# Patient Record
Sex: Male | Born: 1965
Health system: Southern US, Community
[De-identification: ages and names within clinical notes are randomized; demographics above are authoritative.]

## PROBLEM LIST (undated history)

## (undated) DIAGNOSIS — E119 Type 2 diabetes mellitus without complications: Secondary | ICD-10-CM

## (undated) DIAGNOSIS — I10 Essential (primary) hypertension: Secondary | ICD-10-CM

## (undated) DIAGNOSIS — E785 Hyperlipidemia, unspecified: Secondary | ICD-10-CM

## (undated) HISTORY — DX: Hyperlipidemia, unspecified: E78.5

## (undated) HISTORY — PX: NO PAST SURGERIES: SHX2092

## (undated) HISTORY — DX: Essential (primary) hypertension: I10

## (undated) HISTORY — DX: Type 2 diabetes mellitus without complications: E11.9

---

## 2007-06-26 ENCOUNTER — Emergency Department: Payer: Self-pay | Admitting: Emergency Medicine

## 2007-06-27 ENCOUNTER — Emergency Department: Payer: Self-pay | Admitting: Emergency Medicine

## 2013-04-22 ENCOUNTER — Ambulatory Visit: Payer: Self-pay | Admitting: Family Medicine

## 2013-04-22 ENCOUNTER — Emergency Department: Payer: Self-pay | Admitting: Emergency Medicine

## 2014-05-14 ENCOUNTER — Ambulatory Visit: Payer: Self-pay | Admitting: Family Medicine

## 2014-06-02 ENCOUNTER — Ambulatory Visit: Payer: Self-pay | Admitting: Surgery

## 2014-06-15 ENCOUNTER — Ambulatory Visit: Payer: Self-pay | Admitting: Gastroenterology

## 2014-07-06 ENCOUNTER — Ambulatory Visit: Payer: Self-pay | Admitting: Gastroenterology

## 2014-10-30 ENCOUNTER — Emergency Department: Payer: Self-pay | Admitting: Emergency Medicine

## 2014-11-13 IMAGING — NM NUCLEAR MEDICINE GASTRIC EMPTYING STUDY
1 series · 10 of 10 positions shown · non-contrast
Comparison: 06/02/2014 MRI and 05/14/2014 ultrasound

CLINICAL DATA: Diabetes, retained food in stomach after eating

EXAM:
NUCLEAR MEDICINE GASTRIC EMPTYING SCAN
TECHNIQUE: After oral ingestion of radiolabeled meal, sequential abdominal
images were obtained for 4 hours. Percentage of activity emptying
the stomach calculated at 1 hour, 2 hour, 3 hour, and 4 hours.
RADIOPHARMACEUTICALS:  2.14 Technetium 99-m labeled sulfur colloid

[Series 1000: gatric statics (results) · 3.90mm/px · 5 acquisitions, 10 frames shown]
[im 1/5]
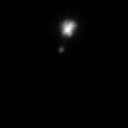
[im 1/5]
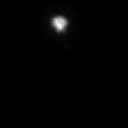
[im 2/5]
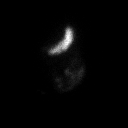
[im 2/5]
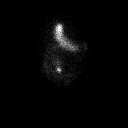
[im 3/5]
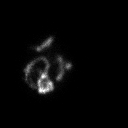
[im 3/5]
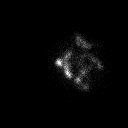
[im 4/5]
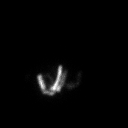
[im 4/5]
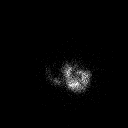
[im 5/5]
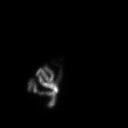
[im 5/5]
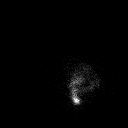

[10 of 10 positions shown; findings below may reference images not displayed]

FINDINGS: Expected location of the stomach in the left upper quadrant.
Ingested meal empties the stomach gradually over the course of the
study.

29% emptied at 1 hr ( normal >= 10%)

86% emptied at 2 hr ( normal >= 40%)

99% emptied at 3 hr ( normal >= 70%)

100% emptied at 4 hr ( normal >= 90%)
IMPRESSION: Normal gastric emptying study.

## 2015-06-04 ENCOUNTER — Other Ambulatory Visit: Payer: Self-pay

## 2015-06-04 NOTE — Telephone Encounter (Signed)
Pharmacy sent Rx request. Thanks!

## 2015-07-05 ENCOUNTER — Ambulatory Visit: Payer: Self-pay | Admitting: Family Medicine

## 2015-07-05 ENCOUNTER — Ambulatory Visit (INDEPENDENT_AMBULATORY_CARE_PROVIDER_SITE_OTHER): Payer: 59 | Admitting: Family Medicine

## 2015-07-05 ENCOUNTER — Encounter: Payer: Self-pay | Admitting: Family Medicine

## 2015-07-05 VITALS — BP 135/75 | HR 88 | Temp 97.1°F | Resp 17 | Ht 70.0 in | Wt 220.6 lb

## 2015-07-05 DIAGNOSIS — E1165 Type 2 diabetes mellitus with hyperglycemia: Secondary | ICD-10-CM | POA: Insufficient documentation

## 2015-07-05 DIAGNOSIS — I10 Essential (primary) hypertension: Secondary | ICD-10-CM | POA: Diagnosis not present

## 2015-07-05 DIAGNOSIS — E785 Hyperlipidemia, unspecified: Secondary | ICD-10-CM | POA: Insufficient documentation

## 2015-07-05 DIAGNOSIS — Z789 Other specified health status: Secondary | ICD-10-CM

## 2015-07-05 DIAGNOSIS — IMO0002 Reserved for concepts with insufficient information to code with codable children: Secondary | ICD-10-CM

## 2015-07-05 MED ORDER — SAXAGLIPTIN HCL 5 MG PO TABS: 5.0000 mg | ORAL_TABLET | Freq: Every day | ORAL | Status: DC

## 2015-07-05 NOTE — Progress Notes (Signed)
Name: [name]   MRN: [id number]    DOB: [birth year]   Date:07/05/2015       Progress Note  Subjective  Chief Complaint  Chief Complaint  Patient presents with  . Advice Only    Complete Insurance Paperwork  . Diabetes    Diabetes He presents for his follow-up diabetic visit. He has type 2 diabetes mellitus. His disease course has been stable. There are no hypoglycemic associated symptoms. Pertinent negatives for hypoglycemia include no headaches. Pertinent negatives for diabetes include no chest pain, no foot paresthesias, no polydipsia and no polyuria. Pertinent negatives for diabetic complications include no CVA. Current diabetic treatment includes oral agent (dual therapy). His weight is stable. He rarely participates in exercise. An ACE inhibitor/angiotensin II receptor blocker is being taken. Eye exam is not current.  Hyperlipidemia This is a chronic problem. Recent lipid tests were reviewed and are high. Exacerbating diseases include diabetes. Pertinent negatives include no chest pain or shortness of breath. Current antihyperlipidemic treatment includes statins. Risk factors for coronary artery disease include male sex, obesity and dyslipidemia.  Hypertension This is a chronic problem. The problem is controlled. Pertinent negatives include no chest pain, headaches, palpitations or shortness of breath. Past treatments include ACE inhibitors and diuretics. There is no history of angina, kidney disease, CAD/MI or CVA.      Past Medical History  Diagnosis Date  . Diabetes mellitus without complication   . Hyperlipidemia   . Hypertension     History reviewed. No pertinent past surgical history.  Family History  Problem Relation Age of Onset  . Heart disease Father   . Diabetes Father     History   Social History  . Marital Status: Married    Spouse Name: N/A  . Number of Children: N/A  . Years of Education: N/A   Occupational History  . Not on file.   Social  History Main Topics  . Smoking status: Never Smoker   . Smokeless tobacco: Never Used  . Alcohol Use: 0.0 oz/week    0 Standard drinks or equivalent per week     Comment: Occasional  . Drug Use: No  . Sexual Activity: Yes   Other Topics Concern  . Not on file   Social History Narrative  . No narrative on file     Current outpatient prescriptions:  .  amLODipine (NORVASC) 10 MG tablet, , Disp: , Rfl:  .  aspirin 81 MG tablet, Take by mouth., Disp: , Rfl:  .  glucose blood (BAYER CONTOUR NEXT TEST) test strip, BAYER CONTOUR NEXT TEST (In Vitro Strip)  1 (one) Strip Strip three times daily for 90 days  Quantity: 1;  Refills: 3   Ordered :03-Feb-2013  [name] ;  Started 03-Feb-2013 Active, Disp: , Rfl:  .  lisinopril-hydrochlorothiazide (PRINZIDE,ZESTORETIC) 20-25 MG per tablet, , Disp: , Rfl:  .  metFORMIN (GLUCOPHAGE) 500 MG tablet, Take by mouth., Disp: , Rfl:  .  saxagliptin HCl (ONGLYZA) 5 MG TABS tablet, Take by mouth., Disp: , Rfl:  .  simvastatin (ZOCOR) 20 MG tablet, Take by mouth., Disp: , Rfl:   No Known Allergies   Review of Systems  Respiratory: Negative for shortness of breath.   Cardiovascular: Negative for chest pain and palpitations.  Neurological: Negative for headaches.  Endo/Heme/Allergies: Negative for polydipsia.     Objective  Filed Vitals:   07/05/15 1558  BP: 135/75  Pulse: 88  Temp: 97.1 F (36.2 C)  TempSrc: Oral  Resp:  17  Height: 5\' 10"  (1.778 m)  Weight: 220 lb 9.6 oz (100.064 kg)  SpO2: 94%    Physical Exam  Constitutional: He is well-developed, well-nourished, and in no distress.  HENT:  Head: Normocephalic and atraumatic.  Nursing note and vitals reviewed.      Assessment & Plan 1. Essential hypertension Blood pressure is stable and controlled on present therapy. Continue present management. - Comprehensive metabolic panel  2. Diabetes mellitus type 2, uncontrolled We will recheck A1c today and follow-up with  medication management. - HgB A1c - saxagliptin HCl (ONGLYZA) 5 MG TABS tablet; Take 1 tablet (5 mg total) by mouth daily.  Dispense: 90 tablet; Refill: 0  3. HLD (hyperlipidemia)  - Lipid panel  4. Non-smoker  - Nicotine/cotinine metabolites   Faline Langer Asad A. [facility] 07/05/2015 4:46 PM

## 2015-07-06 ENCOUNTER — Telehealth: Payer: Self-pay | Admitting: Family Medicine

## 2015-07-06 NOTE — Telephone Encounter (Signed)
Patient wants to know if it is ok for him to take Beta Prostate?

## 2015-07-06 NOTE — Telephone Encounter (Signed)
Patient is asking if he can take an herbal supplement called beta prostate, mainly to help with intimate relations. I have recommended that he should have his prostate examined by a digital rectal exam and a PSA determination if he has any prostate abnormality. I have generally cautioned to stay away from herbal supplements as they do not require the FDA approval to be sold in the market. Patient verbalized understanding.

## 2015-07-06 NOTE — Telephone Encounter (Signed)
Pt wants to know if its ok for him to take Beta Prostate? Please advise pt.

## 2015-07-07 LAB — COMPREHENSIVE METABOLIC PANEL
ALBUMIN: 4.2 g/dL (ref 3.5–5.5)
ALT: 16 IU/L (ref 0–44)
AST: 10 IU/L (ref 0–40)
Albumin/Globulin Ratio: 1.7 (ref 1.1–2.5)
Alkaline Phosphatase: 69 IU/L (ref 39–117)
BUN/Creatinine Ratio: 22 — ABNORMAL HIGH (ref 9–20)
BUN: 25 mg/dL — ABNORMAL HIGH (ref 6–24)
Bilirubin Total: 1.5 mg/dL — ABNORMAL HIGH (ref 0.0–1.2)
CHLORIDE: 99 mmol/L (ref 97–108)
CO2: 24 mmol/L (ref 18–29)
Calcium: 9.7 mg/dL (ref 8.7–10.2)
Creatinine, Ser: 1.16 mg/dL (ref 0.76–1.27)
GFR calc Af Amer: 86 mL/min/{1.73_m2} (ref >59)
GFR calc non Af Amer: 74 mL/min/{1.73_m2} (ref >59)
GLUCOSE: 278 mg/dL — AB (ref 65–99)
Globulin, Total: 2.5 g/dL (ref 1.5–4.5)
Potassium: 4.2 mmol/L (ref 3.5–5.2)
SODIUM: 140 mmol/L (ref 134–144)
Total Protein: 6.7 g/dL (ref 6.0–8.5)

## 2015-07-07 LAB — LIPID PANEL
CHOLESTEROL TOTAL: 188 mg/dL (ref 100–199)
Chol/HDL Ratio: 3.4 ratio units (ref 0.0–5.0)
HDL: 55 mg/dL (ref >39)
LDL Calculated: 94 mg/dL (ref 0–99)
TRIGLYCERIDES: 195 mg/dL — AB (ref 0–149)
VLDL Cholesterol Cal: 39 mg/dL (ref 5–40)

## 2015-07-07 LAB — HEMOGLOBIN A1C
ESTIMATED AVERAGE GLUCOSE: 252 mg/dL
Hgb A1c MFr Bld: 10.4 % — ABNORMAL HIGH (ref 4.8–5.6)

## 2015-07-07 NOTE — Telephone Encounter (Signed)
Dr. [name] has returned call to patient and gave him verbal advice and patient verbalized understanding.

## 2015-07-09 LAB — NICOTINE/COTININE METABOLITES
Cotinine: NOT DETECTED ng/mL
NICOTINE: NOT DETECTED ng/mL

## 2015-07-13 ENCOUNTER — Telehealth: Payer: Self-pay

## 2015-07-20 NOTE — Telephone Encounter (Signed)
Open in error

## 2015-09-02 ENCOUNTER — Other Ambulatory Visit: Payer: Self-pay | Admitting: Family Medicine

## 2015-11-22 ENCOUNTER — Telehealth: Payer: Self-pay

## 2015-11-22 MED ORDER — SAXAGLIPTIN HCL 5 MG PO TABS: 5.0000 mg | ORAL_TABLET | Freq: Every day | ORAL | Status: DC

## 2015-11-22 NOTE — Telephone Encounter (Signed)
Medication has been refilled and sent to [facility] °

## 2015-11-25 ENCOUNTER — Ambulatory Visit (INDEPENDENT_AMBULATORY_CARE_PROVIDER_SITE_OTHER): Payer: 59 | Admitting: Family Medicine

## 2015-11-25 ENCOUNTER — Encounter: Payer: Self-pay | Admitting: Family Medicine

## 2015-11-25 VITALS — BP 132/84 | HR 79 | Temp 97.6°F | Resp 16 | Ht 70.0 in | Wt 223.7 lb

## 2015-11-25 DIAGNOSIS — I10 Essential (primary) hypertension: Secondary | ICD-10-CM | POA: Diagnosis not present

## 2015-11-25 DIAGNOSIS — E785 Hyperlipidemia, unspecified: Secondary | ICD-10-CM | POA: Diagnosis not present

## 2015-11-25 DIAGNOSIS — E1165 Type 2 diabetes mellitus with hyperglycemia: Secondary | ICD-10-CM

## 2015-11-25 DIAGNOSIS — IMO0001 Reserved for inherently not codable concepts without codable children: Secondary | ICD-10-CM

## 2015-11-25 LAB — POCT GLYCOSYLATED HEMOGLOBIN (HGB A1C): HEMOGLOBIN A1C: 9.7

## 2015-11-25 LAB — GLUCOSE, POCT (MANUAL RESULT ENTRY): POC GLUCOSE: 249 mg/dL — AB (ref 70–99)

## 2015-11-25 MED ORDER — SIMVASTATIN 20 MG PO TABS: 20.0000 mg | ORAL_TABLET | Freq: Every day | ORAL | Status: DC

## 2015-11-25 MED ORDER — LISINOPRIL-HYDROCHLOROTHIAZIDE 20-25 MG PO TABS: 1.0000 | ORAL_TABLET | Freq: Every day | ORAL | Status: DC

## 2015-11-25 MED ORDER — SAXAGLIPTIN HCL 5 MG PO TABS: 5.0000 mg | ORAL_TABLET | Freq: Every day | ORAL | Status: DC

## 2015-11-25 MED ORDER — AMLODIPINE BESYLATE 10 MG PO TABS: 10.0000 mg | ORAL_TABLET | Freq: Every day | ORAL | Status: DC

## 2015-11-25 NOTE — Progress Notes (Signed)
Name: [name]   MRN: [id number]    DOB: 1966/10/25   Date:11/25/2015       Progress Note  Subjective  Chief Complaint  Chief Complaint  Patient presents with  . Medication Refill    simvastatin 20 mg / lisiniopril 20-25mg    . Diabetes  . Hyperlipidemia  . Hypertension    Diabetes He presents for his follow-up diabetic visit. He has type 2 diabetes mellitus. Pertinent negatives for hypoglycemia include no headaches. Associated symptoms include polyuria. Pertinent negatives for diabetes include no chest pain, no fatigue, no foot paresthesias and no polydipsia. His weight is stable. Frequency home blood tests: does not check his Blood Glucose.  Hyperlipidemia This is a chronic problem. The problem is uncontrolled. Recent lipid tests were reviewed and are high. Pertinent negatives include no chest pain, leg pain or shortness of breath. Current antihyperlipidemic treatment includes statins.  Hypertension This is a chronic problem. The problem is unchanged. The problem is controlled. Pertinent negatives include no chest pain, headaches, palpitations or shortness of breath. Risk factors for coronary artery disease include dyslipidemia, male gender and obesity. Past treatments include calcium channel blockers, diuretics and ACE inhibitors.    Past Medical History  Diagnosis Date  . Diabetes mellitus without complication ([place])   . Hyperlipidemia   . Hypertension     History reviewed. No pertinent past surgical history.  Family History  Problem Relation Age of Onset  . Heart disease Father   . Diabetes Father     Social History   Social History  . Marital Status: Married    Spouse Name: N/A  . Number of Children: N/A  . Years of Education: N/A   Occupational History  . Not on file.   Social History Main Topics  . Smoking status: Never Smoker   . Smokeless tobacco: Never Used  . Alcohol Use: 0.0 oz/week    0 Standard drinks or equivalent per week     Comment:  Occasional  . Drug Use: No  . Sexual Activity: Yes   Other Topics Concern  . Not on file   Social History Narrative     Current outpatient prescriptions:  .  amLODipine (NORVASC) 10 MG tablet, , Disp: , Rfl:  .  aspirin 81 MG tablet, Take by mouth., Disp: , Rfl:  .  glucose blood (BAYER CONTOUR NEXT TEST) test strip, BAYER CONTOUR NEXT TEST (In Vitro Strip)  1 (one) Strip Strip three times daily for 90 days  Quantity: 1;  Refills: 3   Ordered :03-Feb-2013  [name] ;  Started 03-Feb-2013 Active, Disp: , Rfl:  .  lisinopril-hydrochlorothiazide (PRINZIDE,ZESTORETIC) 20-25 MG per tablet, , Disp: , Rfl:  .  metFORMIN (GLUCOPHAGE) 500 MG tablet, Take by mouth., Disp: , Rfl:  .  saxagliptin HCl (ONGLYZA) 5 MG TABS tablet, Take 1 tablet (5 mg total) by mouth daily., Disp: 90 tablet, Rfl: 0 .  simvastatin (ZOCOR) 20 MG tablet, Take by mouth., Disp: , Rfl:   No Known Allergies   Review of Systems  Constitutional: Negative for fatigue.  Respiratory: Negative for shortness of breath.   Cardiovascular: Negative for chest pain and palpitations.  Neurological: Negative for headaches.  Endo/Heme/Allergies: Negative for polydipsia.    Objective  Filed Vitals:   11/25/15 1333  BP: 132/84  Pulse: 79  Temp: 97.6 F (36.4 C)  TempSrc: Oral  Resp: 16  Height: 5\' 10"  (1.778 m)  Weight: 223 lb 11.2 oz (101.47 kg)  SpO2: 98%  Physical Exam  Constitutional: He is oriented to person, place, and time and well-developed, well-nourished, and in no distress.  HENT:  Head: Normocephalic and atraumatic.  Cardiovascular: Normal rate, regular rhythm and normal heart sounds.   Pulmonary/Chest: Effort normal and breath sounds normal.  Abdominal: Soft. Bowel sounds are normal.  Neurological: He is alert and oriented to person, place, and time.  Nursing note and vitals reviewed.     Assessment & Plan  1. Uncontrolled type 2 diabetes mellitus without complication, without long-term  current use of insulin (HCC)  - POCT Glucose (CBG) - POCT HgB A1C - saxagliptin HCl (ONGLYZA) 5 MG TABS tablet; Take 1 tablet (5 mg total) by mouth daily.  Dispense: 90 tablet; Refill: 0  2. Essential hypertension  - amLODipine (NORVASC) 10 MG tablet; Take 1 tablet (10 mg total) by mouth daily.  Dispense: 90 tablet; Refill: 0 - lisinopril-hydrochlorothiazide (PRINZIDE,ZESTORETIC) 20-25 MG tablet; Take 1 tablet by mouth daily.  Dispense: 90 tablet; Refill: 0  3. HLD (hyperlipidemia)  - Lipid Profile - Comprehensive Metabolic Panel (CMET) - simvastatin (ZOCOR) 20 MG tablet; Take 1 tablet (20 mg total) by mouth daily at 6 PM.  Dispense: 90 tablet; Refill: 0   [name] Asad A. [facility] 11/25/2015 1:43 PM

## 2015-11-26 LAB — LIPID PANEL
Chol/HDL Ratio: 3.5 ratio units (ref 0.0–5.0)
Cholesterol, Total: 215 mg/dL — ABNORMAL HIGH (ref 100–199)
HDL: 62 mg/dL (ref >39)
LDL Calculated: 130 mg/dL — ABNORMAL HIGH (ref 0–99)
Triglycerides: 117 mg/dL (ref 0–149)
VLDL Cholesterol Cal: 23 mg/dL (ref 5–40)

## 2015-11-26 LAB — COMPREHENSIVE METABOLIC PANEL
ALT: 16 IU/L (ref 0–44)
AST: 9 IU/L (ref 0–40)
Albumin/Globulin Ratio: 1.5 (ref 1.1–2.5)
Albumin: 4.1 g/dL (ref 3.5–5.5)
Alkaline Phosphatase: 76 IU/L (ref 39–117)
BUN/Creatinine Ratio: 20 (ref 9–20)
BUN: 20 mg/dL (ref 6–24)
Bilirubin Total: 1 mg/dL (ref 0.0–1.2)
CALCIUM: 10 mg/dL (ref 8.7–10.2)
CHLORIDE: 93 mmol/L — AB (ref 96–106)
CO2: 24 mmol/L (ref 18–29)
CREATININE: 0.98 mg/dL (ref 0.76–1.27)
GFR, EST AFRICAN AMERICAN: 105 mL/min/{1.73_m2} (ref >59)
GFR, EST NON AFRICAN AMERICAN: 91 mL/min/{1.73_m2} (ref >59)
GLUCOSE: 260 mg/dL — AB (ref 65–99)
Globulin, Total: 2.8 g/dL (ref 1.5–4.5)
Potassium: 4.3 mmol/L (ref 3.5–5.2)
Sodium: 134 mmol/L (ref 134–144)
TOTAL PROTEIN: 6.9 g/dL (ref 6.0–8.5)

## 2015-12-22 ENCOUNTER — Telehealth: Payer: Self-pay

## 2015-12-22 MED ORDER — ATORVASTATIN CALCIUM 20 MG PO TABS: 20.0000 mg | ORAL_TABLET | Freq: Every day | ORAL | Status: DC

## 2015-12-22 NOTE — Telephone Encounter (Signed)
Prescription for Lipitor 20 mg has been sent to [facility] per Dr. [name]

## 2016-01-20 ENCOUNTER — Other Ambulatory Visit: Payer: Self-pay | Admitting: Family Medicine

## 2016-01-23 ENCOUNTER — Other Ambulatory Visit: Payer: Self-pay | Admitting: Family Medicine

## 2016-02-09 ENCOUNTER — Other Ambulatory Visit: Payer: Self-pay | Admitting: Family Medicine

## 2016-03-08 ENCOUNTER — Other Ambulatory Visit: Payer: Self-pay | Admitting: Family Medicine

## 2016-03-08 DIAGNOSIS — IMO0001 Reserved for inherently not codable concepts without codable children: Secondary | ICD-10-CM

## 2016-03-08 DIAGNOSIS — E1165 Type 2 diabetes mellitus with hyperglycemia: Principal | ICD-10-CM

## 2016-03-08 MED ORDER — METFORMIN HCL ER 500 MG PO TB24: 500.0000 mg | ORAL_TABLET | Freq: Two times a day (BID) | ORAL | Status: DC

## 2016-03-08 MED ORDER — SITAGLIPTIN PHOSPHATE 100 MG PO TABS: 100.0000 mg | ORAL_TABLET | Freq: Every day | ORAL | Status: DC

## 2016-03-16 ENCOUNTER — Ambulatory Visit: Payer: 59 | Admitting: Family Medicine

## 2016-04-26 ENCOUNTER — Other Ambulatory Visit: Payer: Self-pay | Admitting: Family Medicine

## 2016-04-26 NOTE — Telephone Encounter (Signed)
Medication has been refilled and sent to [facility] 

## 2016-05-19 ENCOUNTER — Other Ambulatory Visit: Payer: Self-pay | Admitting: Family Medicine

## 2016-06-19 ENCOUNTER — Encounter: Payer: Self-pay | Admitting: Family Medicine

## 2016-06-19 ENCOUNTER — Other Ambulatory Visit: Payer: Self-pay | Admitting: Family Medicine

## 2016-06-19 ENCOUNTER — Ambulatory Visit (INDEPENDENT_AMBULATORY_CARE_PROVIDER_SITE_OTHER): Payer: 59 | Admitting: Family Medicine

## 2016-06-19 VITALS — BP 134/77 | HR 80 | Temp 97.3°F | Resp 16 | Ht 70.0 in | Wt 224.0 lb

## 2016-06-19 DIAGNOSIS — I10 Essential (primary) hypertension: Secondary | ICD-10-CM | POA: Diagnosis not present

## 2016-06-19 DIAGNOSIS — E1165 Type 2 diabetes mellitus with hyperglycemia: Secondary | ICD-10-CM | POA: Diagnosis not present

## 2016-06-19 DIAGNOSIS — E785 Hyperlipidemia, unspecified: Secondary | ICD-10-CM

## 2016-06-19 DIAGNOSIS — IMO0001 Reserved for inherently not codable concepts without codable children: Secondary | ICD-10-CM

## 2016-06-19 LAB — POCT UA - MICROALBUMIN: MICROALBUMIN (UR) POC: 20 mg/L

## 2016-06-19 LAB — GLUCOSE, POCT (MANUAL RESULT ENTRY): POC GLUCOSE: 316 mg/dL — AB (ref 70–99)

## 2016-06-19 LAB — POCT GLYCOSYLATED HEMOGLOBIN (HGB A1C): HEMOGLOBIN A1C: 10.9

## 2016-06-19 MED ORDER — ATORVASTATIN CALCIUM 20 MG PO TABS: 20.0000 mg | ORAL_TABLET | Freq: Every day | ORAL | Status: DC

## 2016-06-19 MED ORDER — METFORMIN HCL ER (OSM) 1000 MG PO TB24: 1000.0000 mg | ORAL_TABLET | Freq: Two times a day (BID) | ORAL | Status: DC

## 2016-06-19 MED ORDER — EXENATIDE ER 2 MG ~~LOC~~ PEN: 2.0000 mg | PEN_INJECTOR | SUBCUTANEOUS | Status: DC

## 2016-06-19 NOTE — Progress Notes (Signed)
Name: [name]   MRN: [id number]    DOB: [birth year]   Date:06/19/2016       Progress Note  Subjective  Chief Complaint  Chief Complaint  Patient presents with  . Follow-up    Biometric Screening     Diabetes He presents for his follow-up diabetic visit. He has type 2 diabetes mellitus. His disease course has been worsening. Pertinent negatives for diabetes include no fatigue and no polydipsia. Current diabetic treatment includes oral agent (dual therapy).  Hyperlipidemia This is a chronic problem. The problem is uncontrolled. Recent lipid tests were reviewed and are high. Pertinent negatives include no leg pain, myalgias or shortness of breath. Current antihyperlipidemic treatment includes statins.     Past Medical History  Diagnosis Date  . Diabetes mellitus without complication ([place])   . Hyperlipidemia   . Hypertension     History reviewed. No pertinent past surgical history.  Family History  Problem Relation Age of Onset  . Heart disease Father   . Diabetes Father     Social History   Social History  . Marital Status: Married    Spouse Name: N/A  . Number of Children: N/A  . Years of Education: N/A   Occupational History  . Not on file.   Social History Main Topics  . Smoking status: Never Smoker   . Smokeless tobacco: Never Used  . Alcohol Use: 0.0 oz/week    0 Standard drinks or equivalent per week     Comment: Occasional  . Drug Use: No  . Sexual Activity: Yes   Other Topics Concern  . Not on file   Social History Narrative     Current outpatient prescriptions:  .  amLODipine (NORVASC) 10 MG tablet, Take 1 tablet by mouth  daily, Disp: 90 tablet, Rfl: 0 .  aspirin 81 MG tablet, Take by mouth., Disp: , Rfl:  .  atorvastatin (LIPITOR) 20 MG tablet, Take 1 tablet (20 mg total) by mouth daily., Disp: 90 tablet, Rfl: 0 .  glucose blood (BAYER CONTOUR NEXT TEST) test strip, BAYER CONTOUR NEXT TEST (In Vitro Strip)  1 (one) Strip Strip three times  daily for 90 days  Quantity: 1;  Refills: 3   Ordered :03-Feb-2013  [name] ;  Started 03-Feb-2013 Active, Disp: , Rfl:  .  lisinopril-hydrochlorothiazide (PRINZIDE,ZESTORETIC) 20-25 MG tablet, Take 1 tablet by mouth  daily, Disp: 90 tablet, Rfl: 0 .  metFORMIN (GLUCOPHAGE-XR) 500 MG 24 hr tablet, Take 1 tablet (500 mg total) by mouth 2 (two) times daily after a meal., Disp: 60 tablet, Rfl: 2 .  sitaGLIPtin (JANUVIA) 100 MG tablet, Take 1 tablet (100 mg total) by mouth daily., Disp: 90 tablet, Rfl: 0 .  saxagliptin HCl (ONGLYZA) 5 MG TABS tablet, Take 1 tablet (5 mg total) by mouth daily. (Patient not taking: Reported on 06/19/2016), Disp: 90 tablet, Rfl: 0  No Known Allergies   Review of Systems  Constitutional: Negative for fatigue.  Respiratory: Negative for shortness of breath.   Gastrointestinal: Negative for abdominal pain.  Musculoskeletal: Negative for myalgias.  Endo/Heme/Allergies: Negative for polydipsia.      Objective  Filed Vitals:   06/19/16 1559  BP: 134/77  Pulse: 80  Temp: 97.3 F (36.3 C)  TempSrc: Oral  Resp: 16  Height: 5\' 10"  (1.778 m)  Weight: 224 lb (101.606 kg)  SpO2: 98%    Physical Exam  Constitutional: He is oriented to person, place, and time and well-developed, well-nourished, and in no distress.  HENT:  Head: Normocephalic and atraumatic.  Cardiovascular: Normal rate, regular rhythm and normal heart sounds.   No murmur heard. Pulmonary/Chest: Effort normal and breath sounds normal.  Abdominal: Soft. Bowel sounds are normal.  Neurological: He is alert and oriented to person, place, and time.  Psychiatric: Mood, memory, affect and judgment normal.  Nursing note and vitals reviewed.      Assessment & Plan  1. Uncontrolled type 2 diabetes mellitus without complication, without long-term current use of insulin ([place]) Uncontrolled diabetes mellitus with hypoglycemia, start on GLP-1 agonist therapy with Bydureon, increase metformin to  1000 mg twice daily. Encouraged dietary and lifestyle changes. Recheck in 3 months - Exenatide ER 2 MG PEN; Inject 2 mg into the skin once a week.  Dispense: 4 each; Refill: 2 - metFORMIN (FORTAMET) 1000 MG (OSM) 24 hr tablet; Take 1 tablet (1,000 mg total) by mouth 2 (two) times daily after a meal.  Dispense: 180 tablet; Refill: 0  2. Essential hypertension BP stable and controlled on present therapy  3. HLD (hyperlipidemia)  - Lipid Profile - COMPLETE METABOLIC PANEL WITH GFR - atorvastatin (LIPITOR) 20 MG tablet; Take 1 tablet (20 mg total) by mouth daily.  Dispense: 90 tablet; Refill: 0   Khilynn Borntreger Asad A. [facility] 06/19/2016 4:19 PM

## 2016-06-20 LAB — COMPREHENSIVE METABOLIC PANEL
ALT: 11 IU/L (ref 0–44)
AST: 10 IU/L (ref 0–40)
Albumin/Globulin Ratio: 1.4 (ref 1.2–2.2)
Albumin: 4 g/dL (ref 3.5–5.5)
Alkaline Phosphatase: 83 IU/L (ref 39–117)
BILIRUBIN TOTAL: 1.1 mg/dL (ref 0.0–1.2)
BUN/Creatinine Ratio: 20 (ref 9–20)
BUN: 28 mg/dL — AB (ref 6–24)
CHLORIDE: 94 mmol/L — AB (ref 96–106)
CO2: 23 mmol/L (ref 18–29)
Calcium: 9.5 mg/dL (ref 8.7–10.2)
Creatinine, Ser: 1.42 mg/dL — ABNORMAL HIGH (ref 0.76–1.27)
GFR calc non Af Amer: 58 mL/min/{1.73_m2} — ABNORMAL LOW (ref >59)
GFR, EST AFRICAN AMERICAN: 67 mL/min/{1.73_m2} (ref >59)
GLUCOSE: 303 mg/dL — AB (ref 65–99)
Globulin, Total: 2.9 g/dL (ref 1.5–4.5)
Potassium: 4.7 mmol/L (ref 3.5–5.2)
Sodium: 135 mmol/L (ref 134–144)
TOTAL PROTEIN: 6.9 g/dL (ref 6.0–8.5)

## 2016-06-20 LAB — LIPID PANEL W/O CHOL/HDL RATIO
CHOLESTEROL TOTAL: 247 mg/dL — AB (ref 100–199)
HDL: 60 mg/dL (ref >39)
LDL Calculated: 148 mg/dL — ABNORMAL HIGH (ref 0–99)
TRIGLYCERIDES: 194 mg/dL — AB (ref 0–149)
VLDL CHOLESTEROL CAL: 39 mg/dL (ref 5–40)

## 2016-06-21 ENCOUNTER — Telehealth: Payer: Self-pay | Admitting: Emergency Medicine

## 2016-06-21 NOTE — Telephone Encounter (Signed)
Patient notified of lab results and script at pharmacy

## 2016-08-03 ENCOUNTER — Other Ambulatory Visit: Payer: Self-pay | Admitting: Family Medicine

## 2016-08-03 DIAGNOSIS — IMO0001 Reserved for inherently not codable concepts without codable children: Secondary | ICD-10-CM

## 2016-08-03 DIAGNOSIS — E1165 Type 2 diabetes mellitus with hyperglycemia: Principal | ICD-10-CM

## 2016-08-07 ENCOUNTER — Other Ambulatory Visit: Payer: Self-pay | Admitting: Family Medicine

## 2016-08-07 DIAGNOSIS — E785 Hyperlipidemia, unspecified: Secondary | ICD-10-CM

## 2016-08-15 ENCOUNTER — Telehealth: Payer: Self-pay | Admitting: Family Medicine

## 2016-08-15 NOTE — Telephone Encounter (Signed)
Pt medication has an insert that covers any reactions but a cough is not a side effect of Bydureon and itching at injection site is. If these issues are bothersome please have pt make an appointment to be seen.

## 2016-08-16 NOTE — Telephone Encounter (Signed)
Spoke to pt and informed him of possible side effects of current medication and informed him to come in for appointment if symptoms persist.

## 2016-09-07 ENCOUNTER — Other Ambulatory Visit: Payer: Self-pay | Admitting: Family Medicine

## 2016-09-07 DIAGNOSIS — E1165 Type 2 diabetes mellitus with hyperglycemia: Principal | ICD-10-CM

## 2016-09-07 DIAGNOSIS — IMO0001 Reserved for inherently not codable concepts without codable children: Secondary | ICD-10-CM

## 2016-09-12 ENCOUNTER — Other Ambulatory Visit: Payer: Self-pay | Admitting: Family Medicine

## 2016-12-01 ENCOUNTER — Other Ambulatory Visit: Payer: Self-pay | Admitting: Family Medicine

## 2016-12-06 ENCOUNTER — Other Ambulatory Visit: Payer: Self-pay | Admitting: Family Medicine

## 2016-12-06 DIAGNOSIS — E785 Hyperlipidemia, unspecified: Secondary | ICD-10-CM

## 2016-12-13 ENCOUNTER — Telehealth: Payer: Self-pay

## 2016-12-13 MED ORDER — LISINOPRIL-HYDROCHLOROTHIAZIDE 20-25 MG PO TABS: 1.0000 | ORAL_TABLET | Freq: Every day | ORAL | 0 refills | Status: DC

## 2016-12-13 NOTE — Telephone Encounter (Signed)
Medication has been refilled and sent to [facility], patient has appointment scheduled for 12/26/2016

## 2016-12-26 ENCOUNTER — Encounter: Payer: Self-pay | Admitting: Family Medicine

## 2016-12-26 ENCOUNTER — Ambulatory Visit (INDEPENDENT_AMBULATORY_CARE_PROVIDER_SITE_OTHER): Payer: 59 | Admitting: Family Medicine

## 2016-12-26 VITALS — BP 124/82 | HR 88 | Temp 97.9°F | Resp 18 | Ht 70.0 in | Wt 215.6 lb

## 2016-12-26 DIAGNOSIS — E1165 Type 2 diabetes mellitus with hyperglycemia: Secondary | ICD-10-CM

## 2016-12-26 DIAGNOSIS — E785 Hyperlipidemia, unspecified: Secondary | ICD-10-CM

## 2016-12-26 DIAGNOSIS — I10 Essential (primary) hypertension: Secondary | ICD-10-CM | POA: Diagnosis not present

## 2016-12-26 DIAGNOSIS — IMO0001 Reserved for inherently not codable concepts without codable children: Secondary | ICD-10-CM

## 2016-12-26 LAB — POCT GLYCOSYLATED HEMOGLOBIN (HGB A1C): Hemoglobin A1C: 8.8

## 2016-12-26 LAB — GLUCOSE, POCT (MANUAL RESULT ENTRY): POC GLUCOSE: 191 mg/dL — AB (ref 70–99)

## 2016-12-26 MED ORDER — AMLODIPINE BESYLATE 10 MG PO TABS: 10.0000 mg | ORAL_TABLET | Freq: Every day | ORAL | 0 refills | Status: DC

## 2016-12-26 MED ORDER — ATORVASTATIN CALCIUM 40 MG PO TABS: 20.0000 mg | ORAL_TABLET | Freq: Every day | ORAL | 0 refills | Status: DC

## 2016-12-26 MED ORDER — EMPAGLIFLOZIN 25 MG PO TABS: 25.0000 mg | ORAL_TABLET | Freq: Every day | ORAL | 0 refills | Status: DC

## 2016-12-26 NOTE — Progress Notes (Signed)
Name: [name]   MRN: [id number]    DOB: 06/07/1966   Date:12/26/2016       Progress Note  Subjective  Chief Complaint  Chief Complaint  Patient presents with  . Diabetes    side effects from Bydureon, would like medication change. Follow up with med refills  . Hyperlipidemia  . Hypertension    Diabetes  He presents for his follow-up diabetic visit. He has type 2 diabetes mellitus. His disease course has been improving. There are no hypoglycemic associated symptoms. Pertinent negatives for hypoglycemia include no headaches or sweats. Pertinent negatives for diabetes include no blurred vision, no chest pain, no fatigue, no foot paresthesias, no polydipsia and no polyuria. Pertinent negatives for diabetic complications include no CVA. Current diabetic treatment includes oral agent (triple therapy). He is compliant with treatment some of the time (Stopped taking Bydureon becasue of left sided abdominal wall pain,). He is following a generally healthy diet. His breakfast blood glucose range is generally 110-130 mg/dl. An ACE inhibitor/angiotensin II receptor blocker is being taken.  Hyperlipidemia  This is a chronic problem. The problem is uncontrolled. Recent lipid tests were reviewed and are high. Pertinent negatives include no chest pain, leg pain, myalgias or shortness of breath. Current antihyperlipidemic treatment includes statins.  Hypertension  This is a chronic problem. The problem is unchanged. The problem is controlled. Pertinent negatives include no blurred vision, chest pain, headaches, orthopnea, palpitations, shortness of breath or sweats. Past treatments include ACE inhibitors, diuretics and calcium channel blockers. There is no history of kidney disease, CAD/MI or CVA.    Past Medical History:  Diagnosis Date  . Diabetes mellitus without complication ([name])   . Hyperlipidemia   . Hypertension     No past surgical history on file.  Family History  Problem Relation Age  of Onset  . Heart disease Father   . Diabetes Father     Social History   Social History  . Marital status: Married    Spouse name: N/A  . Number of children: N/A  . Years of education: N/A   Occupational History  . Not on file.   Social History Main Topics  . Smoking status: Never Smoker  . Smokeless tobacco: Never Used  . Alcohol use 0.0 oz/week     Comment: Occasional  . Drug use: No  . Sexual activity: Yes   Other Topics Concern  . Not on file   Social History Narrative  . No narrative on file     Current Outpatient Prescriptions:  .  amLODipine (NORVASC) 10 MG tablet, Take 1 tablet by mouth  daily, Disp: 90 tablet, Rfl: 0 .  aspirin 81 MG tablet, Take by mouth., Disp: , Rfl:  .  atorvastatin (LIPITOR) 40 MG tablet, Take 0.5 tablets (20 mg total) by mouth daily., Disp: 90 tablet, Rfl: 0 .  BYDUREON 2 MG PEN, Inject subcutaneously 2MG   once a week, Disp: 12 each, Rfl: 2 .  glucose blood (BAYER CONTOUR NEXT TEST) test strip, BAYER CONTOUR NEXT TEST (In Vitro Strip)  1 (one) Strip Strip three times daily for 90 days  Quantity: 1;  Refills: 3   Ordered :03-Feb-2013  [name] ;  Started 03-Feb-2013 Active, Disp: , Rfl:  .  lisinopril-hydrochlorothiazide (PRINZIDE,ZESTORETIC) 20-25 MG tablet, Take 1 tablet by mouth daily., Disp: 90 tablet, Rfl: 0 .  metformin (FORTAMET) 1000 MG (OSM) 24 hr tablet, TAKE 1 TABLET BY MOUTH 2  TIMES DAILY AFTER A MEAL., Disp: 180 tablet,  Rfl: 1 .  sitaGLIPtin (JANUVIA) 100 MG tablet, Take 1 tablet (100 mg total) by mouth daily., Disp: 90 tablet, Rfl: 0  No Known Allergies   Review of Systems  Constitutional: Negative for fatigue.  Eyes: Negative for blurred vision.  Respiratory: Negative for shortness of breath.   Cardiovascular: Negative for chest pain, palpitations and orthopnea.  Musculoskeletal: Negative for myalgias.  Neurological: Negative for headaches.  Endo/Heme/Allergies: Negative for polydipsia.    Objective  Vitals:    12/26/16 1537  BP: 124/82  Pulse: 88  Resp: 18  Temp: 97.9 F (36.6 C)  TempSrc: Oral  SpO2: 98%  Weight: 215 lb 9.6 oz (97.8 kg)  Height: 5\' 10"  (1.778 m)    Physical Exam  Constitutional: He is oriented to person, place, and time and well-developed, well-nourished, and in no distress.  HENT:  Head: Normocephalic and atraumatic.  Cardiovascular: Normal rate, regular rhythm and normal heart sounds.   No murmur heard. Pulmonary/Chest: Effort normal and breath sounds normal.  Abdominal: Soft. Bowel sounds are normal.  Neurological: He is alert and oriented to person, place, and time.  Psychiatric: Mood, memory, affect and judgment normal.  Nursing note and vitals reviewed.      Recent Results (from the past 2160 hour(s))  POCT Glucose (CBG)     Status: Abnormal   Collection Time: 12/26/16  3:41 PM  Result Value Ref Range   POC Glucose 191 (A) 70 - 99 mg/dl  POCT HgB A1C     Status: None   Collection Time: 12/26/16  3:44 PM  Result Value Ref Range   Hemoglobin A1C 8.8      Assessment & Plan  1. Essential hypertension  - amLODipine (NORVASC) 10 MG tablet; Take 1 tablet (10 mg total) by mouth daily.  Dispense: 90 tablet; Refill: 0  2. Uncontrolled type 2 diabetes mellitus without complication, without long-term current use of insulin ([name]) Started on Jardiance, D/C Bydureon, A1c improving to 8.8% - POCT HgB A1C - POCT Glucose (CBG) - empagliflozin (JARDIANCE) 25 MG TABS tablet; Take 25 mg by mouth daily.  Dispense: 90 tablet; Refill: 0  3. Hyperlipidemia, unspecified hyperlipidemia type  - Lipid Profile - COMPLETE METABOLIC PANEL WITH GFR - atorvastatin (LIPITOR) 40 MG tablet; Take 0.5 tablets (20 mg total) by mouth daily.  Dispense: 90 tablet; Refill: 0    Teodora Baumgarten Asad A. [facility] 12/26/2016 3:59 PM

## 2017-02-06 ENCOUNTER — Telehealth: Payer: Self-pay | Admitting: Family Medicine

## 2017-02-06 NOTE — Telephone Encounter (Signed)
Nahome Hillier (wife) called. States pt is needing a referral for prostate check. Would like to see [facility]. Please return call [phone]

## 2017-02-07 NOTE — Telephone Encounter (Signed)
Spoke with patient and he will call to schedule appointment

## 2017-02-09 ENCOUNTER — Ambulatory Visit (INDEPENDENT_AMBULATORY_CARE_PROVIDER_SITE_OTHER): Payer: 59 | Admitting: Family Medicine

## 2017-02-09 ENCOUNTER — Encounter: Payer: Self-pay | Admitting: Family Medicine

## 2017-02-09 DIAGNOSIS — R399 Unspecified symptoms and signs involving the genitourinary system: Secondary | ICD-10-CM | POA: Insufficient documentation

## 2017-02-09 NOTE — Progress Notes (Signed)
Name: [name]   MRN: [id number]    DOB: [birth year]   Date:02/09/2017       Progress Note  Subjective  Chief Complaint  Chief Complaint  Patient presents with  . Referral    Urology    HPI  Pt. Presents to obtain a referral to urology for prostate screening, has no history of prostate cancer, but he does gets up 2-3 times every night to urinate, sometimes his stream is weak, and sometimes, he has urinary hesitancy. He also has noticed one episode where he ejaculated but 'nothing came out'   Past Medical History:  Diagnosis Date  . Diabetes mellitus without complication ([place])   . Hyperlipidemia   . Hypertension     History reviewed. No pertinent surgical history.  Family History  Problem Relation Age of Onset  . Heart disease Father   . Diabetes Father     Social History   Social History  . Marital status: Married    Spouse name: N/A  . Number of children: N/A  . Years of education: N/A   Occupational History  . Not on file.   Social History Main Topics  . Smoking status: Never Smoker  . Smokeless tobacco: Never Used  . Alcohol use 0.0 oz/week     Comment: Occasional  . Drug use: No  . Sexual activity: Yes   Other Topics Concern  . Not on file   Social History Narrative  . No narrative on file     Current Outpatient Prescriptions:  .  amLODipine (NORVASC) 10 MG tablet, Take 1 tablet (10 mg total) by mouth daily., Disp: 90 tablet, Rfl: 0 .  aspirin 81 MG tablet, Take by mouth., Disp: , Rfl:  .  atorvastatin (LIPITOR) 40 MG tablet, Take 0.5 tablets (20 mg total) by mouth daily., Disp: 90 tablet, Rfl: 0 .  empagliflozin (JARDIANCE) 25 MG TABS tablet, Take 25 mg by mouth daily., Disp: 90 tablet, Rfl: 0 .  glucose blood (BAYER CONTOUR NEXT TEST) test strip, BAYER CONTOUR NEXT TEST (In Vitro Strip)  1 (one) Strip Strip three times daily for 90 days  Quantity: 1;  Refills: 3   Ordered :03-Feb-2013  [name] ;  Started 03-Feb-2013 Active, Disp: , Rfl:  .   lisinopril-hydrochlorothiazide (PRINZIDE,ZESTORETIC) 20-25 MG tablet, Take 1 tablet by mouth daily., Disp: 90 tablet, Rfl: 0 .  metformin (FORTAMET) 1000 MG (OSM) 24 hr tablet, TAKE 1 TABLET BY MOUTH 2  TIMES DAILY AFTER A MEAL., Disp: 180 tablet, Rfl: 1  No Known Allergies   ROS    Objective  Vitals:   02/09/17 1131  BP: 121/79  Pulse: 94  Resp: 17  Temp: 98.6 F (37 C)  TempSrc: Oral  SpO2: 97%  Weight: 221 lb 8 oz (100.5 kg)  Height: 5\' 10"  (1.778 m)    Physical Exam  Constitutional: He is oriented to person, place, and time and well-developed, well-nourished, and in no distress.  HENT:  Head: Normocephalic and atraumatic.  Cardiovascular: Normal rate, regular rhythm and normal heart sounds.   No murmur heard. Pulmonary/Chest: Effort normal and breath sounds normal.  Neurological: He is alert and oriented to person, place, and time.  Nursing note and vitals reviewed.     Assessment & Plan  1. Lower urinary tract symptoms (LUTS) As listed in history of present illness, referred to urology. - Ambulatory referral to Urology   [facility] 02/09/2017 11:43 AM

## 2017-02-12 ENCOUNTER — Other Ambulatory Visit: Payer: Self-pay | Admitting: Family Medicine

## 2017-02-12 DIAGNOSIS — IMO0001 Reserved for inherently not codable concepts without codable children: Secondary | ICD-10-CM

## 2017-02-12 DIAGNOSIS — E1165 Type 2 diabetes mellitus with hyperglycemia: Principal | ICD-10-CM

## 2017-02-20 ENCOUNTER — Other Ambulatory Visit: Payer: Self-pay | Admitting: Family Medicine

## 2017-02-20 DIAGNOSIS — IMO0001 Reserved for inherently not codable concepts without codable children: Secondary | ICD-10-CM

## 2017-02-20 DIAGNOSIS — E1165 Type 2 diabetes mellitus with hyperglycemia: Principal | ICD-10-CM

## 2017-02-27 ENCOUNTER — Ambulatory Visit: Payer: 59 | Admitting: Urology

## 2017-02-27 ENCOUNTER — Encounter: Payer: Self-pay | Admitting: Urology

## 2017-02-27 VITALS — BP 160/110 | HR 91 | Ht 70.0 in | Wt 217.1 lb

## 2017-02-27 DIAGNOSIS — R351 Nocturia: Secondary | ICD-10-CM

## 2017-02-27 DIAGNOSIS — N401 Enlarged prostate with lower urinary tract symptoms: Secondary | ICD-10-CM

## 2017-02-27 DIAGNOSIS — N529 Male erectile dysfunction, unspecified: Secondary | ICD-10-CM | POA: Diagnosis not present

## 2017-02-27 LAB — BLADDER SCAN AMB NON-IMAGING: SCAN RESULT: 37

## 2017-02-27 MED ORDER — SILDENAFIL CITRATE 20 MG PO TABS: ORAL_TABLET | ORAL | 3 refills | Status: DC

## 2017-02-27 NOTE — Progress Notes (Signed)
02/27/2017 1:27 PM   OTHMAN MASUR 26-Oct-1966 [id number]  Referring provider: [name], MD [address] [place], [place] [address]  Chief Complaint  Patient presents with  . New Patient (Initial Visit)    LUTS referred by Dr. [name]    HPI: Patient is a 51 year old African American male who is referred by Dr. [name] for LU TS.  His IPSS score today is 8, which is moderate lower urinary tract symptomatology.  He is mixed with his quality life due to his urinary symptoms. His PVR is 37 mL.  His major complaints today are nocturia x 2 and intermittency.  He has had these symptoms since starting his Jardiance for DM.  He denies any dysuria, hematuria or suprapubic pain.  He also denies any recent fevers, chills, nausea or vomiting.  He does not have a family history of PCa.     IPSS    Row Name 02/27/17 1100         International Prostate Symptom Score   How often have you had the sensation of not emptying your bladder? Not at All     How often have you had to urinate less than every two hours? Less than 1 in 5 times     How often have you found you stopped and started again several times when you urinated? About half the time     How often have you found it difficult to postpone urination? Not at All     How often have you had a weak urinary stream? Less than half the time     How often have you had to strain to start urination? Not at All     How many times did you typically get up at night to urinate? 2 Times     Total IPSS Score 8       Quality of Life due to urinary symptoms   If you were to spend the rest of your life with your urinary condition just the way it is now how would you feel about that? Mixed        Score:  1-7 Mild 8-19 Moderate 20-35 Severe  Erectile dysfunction He has been having difficulty with erections for last several years.   His major complaint is lack of firmness with erections.  His libido is preserved.   His risk factors  for ED are age, BPH, DM, HTN, HLD, alcohol abuse and blood pressure medications.  He denies any painful erections or curvatures with his erections.   He is still having spontaneous erections.  He has tried Viagra in the past with good results.    PMH: Past Medical History:  Diagnosis Date  . Diabetes mellitus without complication ([place])   . Hyperlipidemia   . Hypertension     Surgical History: History reviewed. No pertinent surgical history.  Home Medications:  Allergies as of 02/27/2017   No Known Allergies     Medication List       Accurate as of 02/27/17 11:59 PM. Always use your most recent med list.          amLODipine 10 MG tablet Commonly known as:  NORVASC Take 1 tablet (10 mg total) by mouth daily.   aspirin 81 MG tablet Take by mouth.   atorvastatin 40 MG tablet Commonly known as:  LIPITOR Take 0.5 tablets (20 mg total) by mouth daily.   BAYER CONTOUR NEXT TEST test strip Generic drug:  glucose  blood BAYER CONTOUR NEXT TEST (In Vitro Strip)  1 (one) Strip Strip three times daily for 90 days  Quantity: 1;  Refills: 3   Ordered :03-Feb-2013  [name] ;  Started 03-Feb-2013 Active   JARDIANCE 25 MG Tabs tablet Generic drug:  empagliflozin TAKE 1 TABLET BY MOUTH  DAILY   lisinopril-hydrochlorothiazide 20-25 MG tablet Commonly known as:  PRINZIDE,ZESTORETIC TAKE 1 TABLET BY MOUTH  DAILY   metformin 1000 MG (OSM) 24 hr tablet Commonly known as:  FORTAMET TAKE 1 TABLET BY MOUTH TWO  TIMES DAILY AFTER MEALS   sildenafil 20 MG tablet Commonly known as:  REVATIO Take 3 to 5 tablets two hours before intercouse on an empty stomach.  Do not take with nitrates.       Allergies: No Known Allergies  Family History: Family History  Problem Relation Age of Onset  . Heart disease Father   . Diabetes Father   . Prostate cancer Neg Hx   . Kidney cancer Neg Hx   . Bladder Cancer Neg Hx     Social History:  reports that he has never smoked. He has never  used smokeless tobacco. He reports that he drinks alcohol. He reports that he does not use drugs.  ROS: UROLOGY Frequent Urination?: No Hard to postpone urination?: No Burning/pain with urination?: No Get up at night to urinate?: Yes Leakage of urine?: No Urine stream starts and stops?: Yes Trouble starting stream?: No Do you have to strain to urinate?: No Blood in urine?: No Urinary tract infection?: No Sexually transmitted disease?: No Injury to kidneys or bladder?: No Painful intercourse?: No Weak stream?: No Erection problems?: Yes Penile pain?: No  Gastrointestinal Nausea?: No Vomiting?: No Indigestion/heartburn?: No Diarrhea?: No Constipation?: No  Constitutional Fever: No Night sweats?: No Weight loss?: No Fatigue?: Yes  Skin Skin rash/lesions?: No Itching?: No  Eyes Blurred vision?: No Double vision?: No  Ears/Nose/Throat Sore throat?: No Sinus problems?: No  Hematologic/Lymphatic Swollen glands?: No Easy bruising?: No  Cardiovascular Leg swelling?: No Chest pain?: No  Respiratory Cough?: No Shortness of breath?: No  Endocrine Excessive thirst?: No  Musculoskeletal Back pain?: No Joint pain?: No  Neurological Headaches?: No Dizziness?: No  Psychologic Depression?: No Anxiety?: No  Physical Exam: BP (!) 160/110   Pulse 91   Ht 5\' 10"  (1.778 m)   Wt 217 lb 1.6 oz (98.5 kg)   BMI 31.15 kg/m   Constitutional: Well nourished. Alert and oriented, No acute distress. HEENT: [place] AT, moist mucus membranes. Trachea midline, no masses. Cardiovascular: No clubbing, cyanosis, or edema. Respiratory: Normal respiratory effort, no increased work of breathing. GI: Abdomen is soft, non tender, non distended, no abdominal masses. Liver and spleen not palpable.  No hernias appreciated.  Stool sample for occult testing is not indicated.   GU: No CVA tenderness.  No bladder fullness or masses.  Patient with uncircumcised phallus.  Foreskin easily  retracted   Urethral meatus is patent.  No penile discharge. No penile lesions or rashes. Scrotum without lesions, cysts, rashes and/or edema.  Testicles are located scrotally bilaterally. No masses are appreciated in the testicles. Left and right epididymis are normal. Rectal: Patient with  normal sphincter tone. Anus and perineum without scarring or rashes. No rectal masses are appreciated. Prostate is approximately 50 grams, no nodules are appreciated. Seminal vesicles are normal. Skin: No rashes, bruises or suspicious lesions. Lymph: No cervical or inguinal adenopathy. Neurologic: Grossly intact, no focal deficits, moving all 4 extremities. Psychiatric: Normal  mood and affect.  Laboratory Data: Lab Results  Component Value Date   CREATININE 1.42 (H) 06/19/2016    Lab Results  Component Value Date   HGBA1C 8.8 12/26/2016       Component Value Date/Time   CHOL 247 (H) 06/19/2016 1655   HDL 60 06/19/2016 1655   CHOLHDL 3.5 11/25/2015 1430   LDLCALC 148 (H) 06/19/2016 1655    Lab Results  Component Value Date   AST 10 06/19/2016   Lab Results  Component Value Date   ALT 11 06/19/2016     Pertinent Imaging: Results for JAKIM, DRAPEAU (MRN [id number]) as of 02/28/2017 13:13  Ref. Range 02/27/2017 11:41  Scan Result Unknown 37    Assessment & Plan:    1. BPH with LUTS  - IPSS score is 8/3  - Continue conservative management, avoiding bladder irritants and timed voiding's  - most bothersome symptoms is intermittency and nocturia  - given Toviaz 4 mg daily # 35, samples  - RTC in 3 weeks for months for I PSS and exam   - BLADDER SCAN AMB NON-IMAGING  - PSA  2. Nocturia  - I explained to the patient that nocturia is often multi-factorial and difficult to treat.  Sleeping disorders, heart conditions, peripheral vascular disease, diabetes, an enlarged prostate for men, an urethral stricture causing bladder outlet obstruction and/or certain medications can contribute to  nocturia.  - I have suggested that the patient avoid caffeine after noon and alcohol in the evening.  He or she may also benefit from fluid restrictions after 6:00 in the evening and voiding just prior to bedtime.  - I have explained that research studies have showed that over 84% of patients with sleep apnea reported frequent nighttime urination.   With sleep apnea, oxygen decreases, carbon dioxide increases, the blood become more acidic, the heart rate drops and blood vessels in the lung constrict.  The body is then alerted that something is very wrong. The sleeper must wake enough to reopen the airway. By this time, the heart is racing and experiences a false signal of fluid overload. The heart excretes a hormone-like protein that tells the body to get rid of sodium and water, resulting in nocturia.  -  I also informed the patient that a recent study noted that decreasing sodium intake to 2.3 grams daily, if they don't have issues with hyponatremia, can also reduce the number of nightly voids  - The patient may benefit from a discussion with his or her primary care physician to see if he or she has risk factors for sleep apnea or other sleep disturbances and obtaining a sleep study.  3. Erectile dysfunction  - I explained to the patient that in order to achieve an erection it takes good functioning of the nervous system (parasympathetic, sympathetic, sensory and motor), good blood flow into the erectile tissue of the penis and a desire to have sex  - I explained that conditions like diabetes, hypertension, coronary artery disease, peripheral vascular disease, smoking, alcohol consumption, age, sleep apnea and BPH can diminish the ability to have an erection  - Continue sildenafil  - RTC in 3 weeks for repeat SHIM score and exam   Return in about 3 weeks (around 03/20/2017) for IPSS,SHIM and PVR.  These notes generated with voice recognition software. I apologize for typographical errors.  Zara Council, [facility] [address] [place], [place] [address] [phone]

## 2017-02-28 ENCOUNTER — Telehealth: Payer: Self-pay

## 2017-02-28 LAB — PSA: Prostate Specific Ag, Serum: 0.6 ng/mL (ref 0.0–4.0)

## 2017-02-28 NOTE — Telephone Encounter (Signed)
-----   Message from Nori Riis, PA-C sent at 02/28/2017  8:19 AM EDT ----- Please notify the patient that his PSA was normal.

## 2017-02-28 NOTE — Telephone Encounter (Signed)
Spoke with pt wife in reference to PSA results. Wife voiced understanding.  

## 2017-03-16 ENCOUNTER — Other Ambulatory Visit: Payer: Self-pay | Admitting: Family Medicine

## 2017-03-16 DIAGNOSIS — I10 Essential (primary) hypertension: Secondary | ICD-10-CM

## 2017-03-16 NOTE — Progress Notes (Signed)
03/19/2017 3:19 PM   [name] 1966/05/31 [id number]  Referring provider: [name], MD [address] [place], [place] [address]  Chief Complaint  Patient presents with  . Follow-up    3 week follow up   bph/nocturia    HPI: 51 yo AAM who presents today for a three week follow up after starting Toviaz 4 mg for intermittency and nocturia.  Patient was referred by Dr. [name] for LU TS.  His IPSS score today is 5, which is mild lower urinary tract symptomatology.  He is pleased with his quality life due to his urinary symptoms. His PVR is 82 mL.  His previous I PSS score 8/3.  His previous PVR was 37 mL.  His major complaints today are nocturia x 2 and intermittency.  He has had these symptoms since starting his Jardiance for DM.  He denies any dysuria, hematuria or suprapubic pain.  He also denies any recent fevers, chills, nausea or vomiting.  He does not have a family history of PCa.     IPSS    Row Name 02/27/17 1100 03/19/17 1400       International Prostate Symptom Score   How often have you had the sensation of not emptying your bladder? Not at All Not at All    How often have you had to urinate less than every two hours? Less than 1 in 5 times Less than 1 in 5 times    How often have you found you stopped and started again several times when you urinated? About half the time Less than 1 in 5 times    How often have you found it difficult to postpone urination? Not at All Not at All    How often have you had a weak urinary stream? Less than half the time Less than 1 in 5 times    How often have you had to strain to start urination? Not at All Not at All    How many times did you typically get up at night to urinate? 2 Times 2 Times    Total IPSS Score 8 5      Quality of Life due to urinary symptoms   If you were to spend the rest of your life with your urinary condition just the way it is now how would you feel about that? Mixed Pleased        Score:  1-7 Mild 8-19 Moderate 20-35 Severe  Erectile dysfunction His SHIM score today is 15, which is mild to moderate ED.  He has been having difficulty with erections for last several years.   His major complaint is lack of firmness with erections.  His libido is preserved.   His risk factors for ED are age, BPH, DM, HTN, HLD, alcohol abuse and blood pressure medications.  He denies any painful erections or curvatures with his erections.   He is still having spontaneous erections.  He has tried Viagra in the past with good results.        SHIM    Row Name 03/19/17 1505         SHIM: Over the last 6 months:   How do you rate your confidence that you could get and keep an erection? Moderate     When you had erections with sexual stimulation, how often were your erections hard enough for penetration (entering your partner)? Sometimes (about half the time)     During sexual intercourse, how  often were you able to maintain your erection after you had penetrated (entered) your partner? A Few Times (much less than half the time)     During sexual intercourse, how difficult was it to maintain your erection to completion of intercourse? Slightly Difficult     When you attempted sexual intercourse, how often was it satisfactory for you? Sometimes (about half the time)       SHIM Total Score   SHIM 15         PMH: Past Medical History:  Diagnosis Date  . Diabetes mellitus without complication ([place])   . Hyperlipidemia   . Hypertension     Surgical History: History reviewed. No pertinent surgical history.  Home Medications:  Allergies as of 03/19/2017   No Known Allergies     Medication List       Accurate as of 03/19/17  3:19 PM. Always use your most recent med list.          amLODipine 10 MG tablet Commonly known as:  NORVASC Take 1 tablet (10 mg total) by mouth daily.   aspirin 81 MG tablet Take by mouth.   atorvastatin 40 MG tablet Commonly known as:   LIPITOR Take 0.5 tablets (20 mg total) by mouth daily.   BAYER CONTOUR NEXT TEST test strip Generic drug:  glucose blood BAYER CONTOUR NEXT TEST (In Vitro Strip)  1 (one) Strip Strip three times daily for 90 days  Quantity: 1;  Refills: 3   Ordered :03-Feb-2013  [name] ;  Started 03-Feb-2013 Active   fesoterodine 4 MG Tb24 tablet Commonly known as:  TOVIAZ Take 1 tablet (4 mg total) by mouth daily.   JARDIANCE 25 MG Tabs tablet Generic drug:  empagliflozin TAKE 1 TABLET BY MOUTH  DAILY   lisinopril-hydrochlorothiazide 20-25 MG tablet Commonly known as:  PRINZIDE,ZESTORETIC TAKE 1 TABLET BY MOUTH  DAILY   metformin 1000 MG (OSM) 24 hr tablet Commonly known as:  FORTAMET TAKE 1 TABLET BY MOUTH TWO  TIMES DAILY AFTER MEALS   sildenafil 20 MG tablet Commonly known as:  REVATIO Take 3 to 5 tablets two hours before intercouse on an empty stomach.  Do not take with nitrates.       Allergies: No Known Allergies  Family History: Family History  Problem Relation Age of Onset  . Heart disease Father   . Diabetes Father   . Prostate cancer Neg Hx   . Kidney cancer Neg Hx   . Bladder Cancer Neg Hx     Social History:  reports that he has never smoked. He has never used smokeless tobacco. He reports that he drinks alcohol. He reports that he does not use drugs.  ROS: UROLOGY Frequent Urination?: No Hard to postpone urination?: No Burning/pain with urination?: No Get up at night to urinate?: Yes Leakage of urine?: No Urine stream starts and stops?: Yes Trouble starting stream?: No Do you have to strain to urinate?: No Blood in urine?: No Urinary tract infection?: No Sexually transmitted disease?: No Injury to kidneys or bladder?: No Painful intercourse?: No Weak stream?: No Erection problems?: No Penile pain?: No  Gastrointestinal Nausea?: No Vomiting?: No Indigestion/heartburn?: No Diarrhea?: No Constipation?: No  Constitutional Fever: No Night sweats?:  No Weight loss?: No Fatigue?: No  Skin Skin rash/lesions?: No Itching?: No  Eyes Blurred vision?: No Double vision?: No  Ears/Nose/Throat Sore throat?: No Sinus problems?: No  Hematologic/Lymphatic Swollen glands?: No Easy bruising?: No  Cardiovascular Leg swelling?: No Chest pain?:  No  Respiratory Cough?: No Shortness of breath?: No  Endocrine Excessive thirst?: No  Musculoskeletal Back pain?: No Joint pain?: No  Neurological Headaches?: No Dizziness?: No  Psychologic Depression?: No Anxiety?: No  Physical Exam: BP (!) 187/107   Pulse (!) 103   Ht 5\' 11"  (1.803 m)   Wt 222 lb 1.6 oz (100.7 kg)   BMI 30.98 kg/m   Constitutional: Well nourished. Alert and oriented, No acute distress. HEENT: [place] AT, moist mucus membranes. Trachea midline, no masses. Cardiovascular: No clubbing, cyanosis, or edema. Respiratory: Normal respiratory effort, no increased work of breathing. Skin: No rashes, bruises or suspicious lesions. Lymph: No cervical or inguinal adenopathy. Neurologic: Grossly intact, no focal deficits, moving all 4 extremities. Psychiatric: Normal mood and affect.  Laboratory Data: PSA History  0.6 ng/mL on 02/27/2017   Lab Results  Component Value Date   CREATININE 1.42 (H) 06/19/2016    Lab Results  Component Value Date   HGBA1C 8.8 12/26/2016       Component Value Date/Time   CHOL 247 (H) 06/19/2016 1655   HDL 60 06/19/2016 1655   CHOLHDL 3.5 11/25/2015 1430   LDLCALC 148 (H) 06/19/2016 1655    Lab Results  Component Value Date   AST 10 06/19/2016   Lab Results  Component Value Date   ALT 11 06/19/2016     Pertinent Imaging: Results for DEFOREST, MAIDEN (MRN [id number]) as of 03/19/2017 15:19  Ref. Range 03/19/2017 15:08  Scan Result Unknown 82   Assessment & Plan:    1. BPH with LUTS  - IPSS score is 5/1, it is improving  - Continue conservative management, avoiding bladder irritants and timed voiding's  - most  bothersome symptoms is intermittency and nocturia - improved slightly  - given Toviaz 4 mg daily # 35, samples -prescription sent to pharmacy  - RTC in 3 months for months for I PSS and exam   - BLADDER SCAN AMB NON-IMAGING   2. Nocturia  - he has not spoken to Dr. [name] about sleep apnea  3. Erectile dysfunction  - RTC in 3 months for repeat SHIM score and exam   Return in about 3 months (around 06/18/2017) for IPSS and PVR.  These notes generated with voice recognition software. I apologize for typographical errors.  Zara Council, [facility] [address] [place], [place] [address] [phone]

## 2017-03-19 ENCOUNTER — Ambulatory Visit: Payer: 59 | Admitting: Urology

## 2017-03-19 ENCOUNTER — Encounter: Payer: Self-pay | Admitting: Urology

## 2017-03-19 VITALS — BP 187/107 | HR 103 | Ht 71.0 in | Wt 222.1 lb

## 2017-03-19 DIAGNOSIS — N529 Male erectile dysfunction, unspecified: Secondary | ICD-10-CM

## 2017-03-19 DIAGNOSIS — R351 Nocturia: Secondary | ICD-10-CM | POA: Diagnosis not present

## 2017-03-19 DIAGNOSIS — N401 Enlarged prostate with lower urinary tract symptoms: Secondary | ICD-10-CM

## 2017-03-19 LAB — BLADDER SCAN AMB NON-IMAGING: SCAN RESULT: 82

## 2017-03-19 MED ORDER — FESOTERODINE FUMARATE ER 4 MG PO TB24: 4.0000 mg | ORAL_TABLET | Freq: Every day | ORAL | 0 refills | Status: DC

## 2017-05-01 ENCOUNTER — Other Ambulatory Visit: Payer: Self-pay | Admitting: Family Medicine

## 2017-05-01 DIAGNOSIS — IMO0001 Reserved for inherently not codable concepts without codable children: Secondary | ICD-10-CM

## 2017-05-01 DIAGNOSIS — E1165 Type 2 diabetes mellitus with hyperglycemia: Principal | ICD-10-CM

## 2017-05-07 ENCOUNTER — Other Ambulatory Visit: Payer: Self-pay | Admitting: Urology

## 2017-05-08 ENCOUNTER — Other Ambulatory Visit: Payer: Self-pay | Admitting: Family Medicine

## 2017-05-10 ENCOUNTER — Other Ambulatory Visit: Payer: Self-pay | Admitting: Family Medicine

## 2017-05-30 ENCOUNTER — Other Ambulatory Visit: Payer: Self-pay | Admitting: Family Medicine

## 2017-06-04 ENCOUNTER — Encounter: Payer: Self-pay | Admitting: Family Medicine

## 2017-06-04 ENCOUNTER — Ambulatory Visit (INDEPENDENT_AMBULATORY_CARE_PROVIDER_SITE_OTHER): Payer: 59 | Admitting: Family Medicine

## 2017-06-04 VITALS — BP 154/89 | HR 97 | Temp 97.9°F | Resp 17 | Ht 71.0 in | Wt 217.0 lb

## 2017-06-04 DIAGNOSIS — IMO0001 Reserved for inherently not codable concepts without codable children: Secondary | ICD-10-CM

## 2017-06-04 DIAGNOSIS — I10 Essential (primary) hypertension: Secondary | ICD-10-CM

## 2017-06-04 DIAGNOSIS — E782 Mixed hyperlipidemia: Secondary | ICD-10-CM

## 2017-06-04 DIAGNOSIS — E1165 Type 2 diabetes mellitus with hyperglycemia: Secondary | ICD-10-CM | POA: Diagnosis not present

## 2017-06-04 LAB — POCT GLYCOSYLATED HEMOGLOBIN (HGB A1C): HEMOGLOBIN A1C: 10.8

## 2017-06-04 LAB — GLUCOSE, POCT (MANUAL RESULT ENTRY): POC GLUCOSE: 292 mg/dL — AB (ref 70–99)

## 2017-06-04 MED ORDER — SEMAGLUTIDE (1 MG/DOSE) 2 MG/1.5ML ~~LOC~~ SOPN: 1.0000 mg | PEN_INJECTOR | SUBCUTANEOUS | 1 refills | Status: DC

## 2017-06-04 MED ORDER — SEMAGLUTIDE(0.25 OR 0.5MG/DOS) 2 MG/1.5ML ~~LOC~~ SOPN: 0.2500 mg | PEN_INJECTOR | SUBCUTANEOUS | 2 refills | Status: DC

## 2017-06-04 MED ORDER — LISINOPRIL-HYDROCHLOROTHIAZIDE 20-25 MG PO TABS: 1.0000 | ORAL_TABLET | Freq: Every day | ORAL | 0 refills | Status: DC

## 2017-06-04 NOTE — Progress Notes (Signed)
Name: [name]   MRN: [id number]    DOB: 1966/03/31   Date:06/04/2017       Progress Note  Subjective  Chief Complaint  Chief Complaint  Patient presents with  . Follow-up    3 mo  . Medication Refill    Diabetes  He presents for his follow-up diabetic visit. He has type 2 diabetes mellitus. His disease course has been worsening. There are no hypoglycemic associated symptoms. Pertinent negatives for hypoglycemia include no headaches or sweats. Pertinent negatives for diabetes include no blurred vision, no chest pain, no fatigue, no foot paresthesias (sometimes feels his foot is 'asleep when lying down'), no polydipsia and no polyuria. Pertinent negatives for diabetic complications include no CVA. Current diabetic treatment includes oral agent (dual therapy) and diet. He is compliant with treatment some of the time. He is following a generally unhealthy (he is eating what he is able to afford, lost his job and have not been able to carry out his routine as usual. ) diet. His breakfast blood glucose range is generally >200 mg/dl. An ACE inhibitor/angiotensin II receptor blocker is being taken.  Hyperlipidemia  This is a chronic problem. The problem is uncontrolled. Recent lipid tests were reviewed and are high. Pertinent negatives include no chest pain, leg pain, myalgias or shortness of breath. Current antihyperlipidemic treatment includes statins.  Hypertension  This is a chronic problem. The problem is unchanged. The problem is uncontrolled. Pertinent negatives include no blurred vision, chest pain, headaches, orthopnea, palpitations, shortness of breath or sweats. Past treatments include ACE inhibitors, diuretics and calcium channel blockers. Compliance problems: has ran out of Lisinopril-HCTZ.  There is no history of kidney disease, CAD/MI or CVA.     Past Medical History:  Diagnosis Date  . Diabetes mellitus without complication ([place])   . Hyperlipidemia   . Hypertension      History reviewed. No pertinent surgical history.  Family History  Problem Relation Age of Onset  . Heart disease Father   . Diabetes Father   . Prostate cancer Neg Hx   . Kidney cancer Neg Hx   . Bladder Cancer Neg Hx     Social History   Social History  . Marital status: Married    Spouse name: N/A  . Number of children: N/A  . Years of education: N/A   Occupational History  . Not on file.   Social History Main Topics  . Smoking status: Never Smoker  . Smokeless tobacco: Never Used  . Alcohol use 0.0 oz/week     Comment: Occasional  . Drug use: No  . Sexual activity: Yes   Other Topics Concern  . Not on file   Social History Narrative  . No narrative on file     Current Outpatient Prescriptions:  .  amLODipine (NORVASC) 10 MG tablet, Take 1 tablet (10 mg total) by mouth daily., Disp: 90 tablet, Rfl: 0 .  aspirin 81 MG tablet, Take by mouth., Disp: , Rfl:  .  atorvastatin (LIPITOR) 40 MG tablet, Take 0.5 tablets (20 mg total) by mouth daily., Disp: 90 tablet, Rfl: 0 .  glucose blood (BAYER CONTOUR NEXT TEST) test strip, BAYER CONTOUR NEXT TEST (In Vitro Strip)  1 (one) Strip Strip three times daily for 90 days  Quantity: 1;  Refills: 3   Ordered :03-Feb-2013  [name] ;  Started 03-Feb-2013 Active, Disp: , Rfl:  .  JARDIANCE 25 MG TABS tablet, TAKE 1 TABLET BY MOUTH  DAILY, Disp: 90  tablet, Rfl: 0 .  lisinopril-hydrochlorothiazide (PRINZIDE,ZESTORETIC) 20-25 MG tablet, TAKE 1 TABLET BY MOUTH  DAILY, Disp: 90 tablet, Rfl: 0 .  metformin (FORTAMET) 1000 MG (OSM) 24 hr tablet, TAKE 1 TABLET BY MOUTH TWO  TIMES DAILY AFTER MEALS, Disp: 180 tablet, Rfl: 1 .  sildenafil (REVATIO) 20 MG tablet, Take 3 to 5 tablets two hours before intercouse on an empty stomach.  Do not take with nitrates., Disp: 50 tablet, Rfl: 3 .  TOVIAZ 4 MG TB24 tablet, TAKE 1 TABLET BY MOUTH  DAILY, Disp: 90 tablet, Rfl: 0  No Known Allergies   Review of Systems  Constitutional: Negative  for fatigue.  Eyes: Negative for blurred vision.  Respiratory: Negative for shortness of breath.   Cardiovascular: Negative for chest pain, palpitations and orthopnea.  Musculoskeletal: Negative for myalgias.  Neurological: Negative for headaches.  Endo/Heme/Allergies: Negative for polydipsia.     Objective  Vitals:   06/04/17 0854  BP: (!) 154/89  Pulse: 97  Resp: 17  Temp: 97.9 F (36.6 C)  TempSrc: Oral  SpO2: 97%  Weight: 217 lb (98.4 kg)  Height: 5\' 11"  (1.803 m)    Physical Exam  Constitutional: He is oriented to person, place, and time and well-developed, well-nourished, and in no distress.  HENT:  Head: Normocephalic and atraumatic.  Cardiovascular: Normal rate, regular rhythm and normal heart sounds.   No murmur heard. Pulmonary/Chest: Effort normal and breath sounds normal. He has no wheezes.  Abdominal: Soft. Bowel sounds are normal. There is no tenderness.  Musculoskeletal: He exhibits edema (trace pitting edema bilaterally.).  Neurological: He is alert and oriented to person, place, and time.  Nursing note and vitals reviewed.      Recent Results (from the past 2160 hour(s))  BLADDER SCAN AMB NON-IMAGING     Status: None   Collection Time: 03/19/17  3:08 PM  Result Value Ref Range   Scan Result 82   POCT Glucose (CBG)     Status: Abnormal   Collection Time: 06/04/17  9:12 AM  Result Value Ref Range   POC Glucose 292 (A) 70 - 99 mg/dl  POCT HgB A1C     Status: Abnormal   Collection Time: 06/04/17  9:20 AM  Result Value Ref Range   Hemoglobin A1C 10.8      Assessment & Plan  1. Uncontrolled type 2 diabetes mellitus without complication, without long-term current use of insulin (HCC) Point-of-care A1c is 10.8%, poorly controlled diabetes, will add 07 take to patient's diabetes pharmacotherapeutic regimen. Likely worse because of poor dietary compliance, recheck in 3 months - POCT HgB A1C - POCT Glucose (CBG) - Semaglutide (OZEMPIC) 0.25 or 0.5  MG/DOSE SOPN; Inject 0.25 mg into the skin once a week.  Dispense: 4 pen; Refill: 2 - Semaglutide (OZEMPIC) 1 MG/DOSE SOPN; Inject 1 mg into the skin once a week.  Dispense: 4 pen; Refill: 1  2. Mixed hyperlipidemia Patient has not obtained FLP ordered in January 2018  3. Essential hypertension BP elevated, patient was unable to fill the medication for a few days. Refills provided and recheck in 1 month - lisinopril-hydrochlorothiazide (PRINZIDE,ZESTORETIC) 20-25 MG tablet; Take 1 tablet by mouth daily.  Dispense: 90 tablet; Refill: 0  Mem[place] Creswell Asad A. [facility] 06/04/2017 9:21 AM

## 2017-06-24 NOTE — Progress Notes (Deleted)
06/25/2017 9:01 PM   [name] [birth year] [id number]  Referring provider: [name], MD [address] [place], [place] [address]  No chief complaint on file.   HPI: 51 yo AAM who presents today for a three month follow up for nocturia, BPH with LU TS and ED.    Patient was referred by Dr. [name] for LU TS.  His IPSS score today is ***, which is *** lower urinary tract symptomatology.  He is *** with his quality life due to his urinary symptoms. His PVR is *** mL.  His previous I PSS score 5/1.  His previous PVR was 82 mL.  His major complaints today are nocturia x 2 and intermittency.  He has had these symptoms since starting his Jardiance for DM.  He denies any dysuria, hematuria or suprapubic pain.  He also denies any recent fevers, chills, nausea or vomiting.  He does not have a family history of PCa.   Score:  1-7 Mild 8-19 Moderate 20-35 Severe  Erectile dysfunction His SHIM score today is ***, which is *** ED.  His previous SHIM score was 15.  He has been having difficulty with erections for last several years.   His major complaint is lack of firmness with erections.  His libido is preserved.   His risk factors for ED are age, BPH, DM, HTN, HLD, alcohol abuse and blood pressure medications.  He denies any painful erections or curvatures with his erections.   He is still having spontaneous erections.  He has tried Viagra in the past with good results.       PMH: Past Medical History:  Diagnosis Date  . Diabetes mellitus without complication ([place])   . Hyperlipidemia   . Hypertension     Surgical History: No past surgical history on file.  Home Medications:  Allergies as of 06/25/2017   No Known Allergies     Medication List       Accurate as of 06/24/17  9:01 PM. Always use your most recent med list.          amLODipine 10 MG tablet Commonly known as:  NORVASC Take 1 tablet (10 mg total) by mouth daily.   aspirin 81 MG  tablet Take by mouth.   atorvastatin 40 MG tablet Commonly known as:  LIPITOR Take 0.5 tablets (20 mg total) by mouth daily.   BAYER CONTOUR NEXT TEST test strip Generic drug:  glucose blood BAYER CONTOUR NEXT TEST (In Vitro Strip)  1 (one) Strip Strip three times daily for 90 days  Quantity: 1;  Refills: 3   Ordered :03-Feb-2013  [name] ;  Started 03-Feb-2013 Active   JARDIANCE 25 MG Tabs tablet Generic drug:  empagliflozin TAKE 1 TABLET BY MOUTH  DAILY   lisinopril-hydrochlorothiazide 20-25 MG tablet Commonly known as:  PRINZIDE,ZESTORETIC Take 1 tablet by mouth daily.   metformin 1000 MG (OSM) 24 hr tablet Commonly known as:  FORTAMET TAKE 1 TABLET BY MOUTH TWO  TIMES DAILY AFTER MEALS   Semaglutide 0.25 or 0.5 MG/DOSE Sopn Commonly known as:  OZEMPIC Inject 0.25 mg into the skin once a week.   Semaglutide 1 MG/DOSE Sopn Commonly known as:  OZEMPIC Inject 1 mg into the skin once a week.   sildenafil 20 MG tablet Commonly known as:  REVATIO Take 3 to 5 tablets two hours before intercouse on an empty stomach.  Do not take with nitrates.   TOVIAZ 4 MG Tb24 tablet Generic  drug:  fesoterodine TAKE 1 TABLET BY MOUTH  DAILY       Allergies: No Known Allergies  Family History: Family History  Problem Relation Age of Onset  . Heart disease Father   . Diabetes Father   . Prostate cancer Neg Hx   . Kidney cancer Neg Hx   . Bladder Cancer Neg Hx     Social History:  reports that he has never smoked. He has never used smokeless tobacco. He reports that he drinks alcohol. He reports that he does not use drugs.  ROS:                                        Physical Exam: There were no vitals taken for this visit.  Constitutional: Well nourished. Alert and oriented, No acute distress. HEENT: [place] AT, moist mucus membranes. Trachea midline, no masses. Cardiovascular: No clubbing, cyanosis, or edema. Respiratory: Normal respiratory effort,  no increased work of breathing. GI: Abdomen is soft, non tender, non distended, no abdominal masses. Liver and spleen not palpable.  No hernias appreciated.  Stool sample for occult testing is not indicated.   GU: No CVA tenderness.  No bladder fullness or masses.  Patient with circumcised/uncircumcised phallus. ***Foreskin easily retracted***  Urethral meatus is patent.  No penile discharge. No penile lesions or rashes. Scrotum without lesions, cysts, rashes and/or edema.  Testicles are located scrotally bilaterally. No masses are appreciated in the testicles. Left and right epididymis are normal. Rectal: Patient with  normal sphincter tone. Anus and perineum without scarring or rashes. No rectal masses are appreciated. Prostate is approximately *** grams, *** nodules are appreciated. Seminal vesicles are normal. Skin: No rashes, bruises or suspicious lesions. Lymph: No cervical or inguinal adenopathy. Neurologic: Grossly intact, no focal deficits, moving all 4 extremities. Psychiatric: Normal mood and affect. .  Laboratory Data: PSA History  0.6 ng/mL on 02/27/2017    Lab Results  Component Value Date   HGBA1C 10.8 06/04/2017     Pertinent Imaging: ***  Assessment & Plan:    1. BPH with LUTS  - IPSS score is 5/1, it is improving  - Continue conservative management, avoiding bladder irritants and timed voiding's  - most bothersome symptoms is intermittency and nocturia - improved slightly  - given Toviaz 4 mg daily # 35, samples -prescription sent to pharmacy  - RTC in 3 months for months for I PSS and exam   - BLADDER SCAN AMB NON-IMAGING   2. Nocturia  - he has not spoken to Dr. [name] about sleep apnea  3. Erectile dysfunction  - RTC in 3 months for repeat SHIM score and exam   No Follow-up on file.  These notes generated with voice recognition software. I apologize for typographical errors.  Zara Council, [facility] [address], [place],  [address] [phone]

## 2017-06-25 ENCOUNTER — Ambulatory Visit: Payer: 59 | Admitting: Urology

## 2017-06-25 ENCOUNTER — Encounter: Payer: Self-pay | Admitting: Urology

## 2017-07-02 ENCOUNTER — Ambulatory Visit: Payer: Self-pay | Admitting: Family Medicine

## 2017-07-04 ENCOUNTER — Other Ambulatory Visit: Payer: Self-pay | Admitting: Family Medicine

## 2017-07-04 DIAGNOSIS — E1165 Type 2 diabetes mellitus with hyperglycemia: Principal | ICD-10-CM

## 2017-07-04 DIAGNOSIS — IMO0001 Reserved for inherently not codable concepts without codable children: Secondary | ICD-10-CM

## 2017-07-04 DIAGNOSIS — E785 Hyperlipidemia, unspecified: Secondary | ICD-10-CM

## 2017-07-11 ENCOUNTER — Ambulatory Visit: Payer: 59 | Admitting: Family Medicine

## 2017-08-01 ENCOUNTER — Encounter: Payer: Self-pay | Admitting: Family Medicine

## 2017-08-01 ENCOUNTER — Ambulatory Visit: Payer: 59 | Admitting: Family Medicine

## 2017-08-02 ENCOUNTER — Ambulatory Visit: Payer: 59 | Admitting: Family Medicine

## 2017-08-02 NOTE — Progress Notes (Signed)
This encounter was created in error - please disregard.

## 2017-09-11 ENCOUNTER — Other Ambulatory Visit: Payer: Self-pay

## 2017-09-11 DIAGNOSIS — E1165 Type 2 diabetes mellitus with hyperglycemia: Secondary | ICD-10-CM

## 2017-09-11 DIAGNOSIS — I10 Essential (primary) hypertension: Secondary | ICD-10-CM

## 2017-09-11 DIAGNOSIS — IMO0001 Reserved for inherently not codable concepts without codable children: Secondary | ICD-10-CM

## 2017-09-11 NOTE — Telephone Encounter (Signed)
Refill request was sent to Dr. Syed Asad A. Shah for approval and submission.  

## 2017-09-14 ENCOUNTER — Telehealth: Payer: Self-pay

## 2017-09-14 DIAGNOSIS — E1165 Type 2 diabetes mellitus with hyperglycemia: Principal | ICD-10-CM

## 2017-09-14 DIAGNOSIS — I10 Essential (primary) hypertension: Secondary | ICD-10-CM

## 2017-09-14 DIAGNOSIS — IMO0001 Reserved for inherently not codable concepts without codable children: Secondary | ICD-10-CM

## 2017-09-14 NOTE — Telephone Encounter (Signed)
Patient requesting refill of Amlodipine, Jardiance, and Lisinopril-HCTZ to [facility].

## 2017-09-18 MED ORDER — LISINOPRIL-HYDROCHLOROTHIAZIDE 20-25 MG PO TABS: 1.0000 | ORAL_TABLET | Freq: Every day | ORAL | 0 refills | Status: DC

## 2017-09-18 NOTE — Telephone Encounter (Signed)
Prescription for lisinopril-HCTZ sent to [facility]

## 2017-09-18 NOTE — Addendum Note (Signed)
Addended byManuella Ghazi, Zienna Ahlin ASAD A on: 09/18/2017 10:44 AM   Modules accepted: Orders

## 2017-09-18 NOTE — Telephone Encounter (Signed)
Pt is having complications due to being out of meds, he is requesting enough to last him until his appt next Wednesday please advise.

## 2017-09-18 NOTE — Telephone Encounter (Signed)
Pt is completely out of lisinopril (which is causing his feet to swell). Per your request the patient has scheduled an appointment for next Wednesday (he is not able to come in sooner due to work). Please send in enough to [facility] to last until appt. Please contact pt and let him or his wife know if this will be able to be done. Thank you

## 2017-09-26 ENCOUNTER — Ambulatory Visit: Payer: 59 | Admitting: Family Medicine

## 2017-11-02 ENCOUNTER — Other Ambulatory Visit: Payer: Self-pay | Admitting: Family Medicine

## 2017-11-02 DIAGNOSIS — IMO0001 Reserved for inherently not codable concepts without codable children: Secondary | ICD-10-CM

## 2017-11-02 DIAGNOSIS — E1165 Type 2 diabetes mellitus with hyperglycemia: Principal | ICD-10-CM

## 2017-12-26 ENCOUNTER — Ambulatory Visit (INDEPENDENT_AMBULATORY_CARE_PROVIDER_SITE_OTHER): Payer: BLUE CROSS/BLUE SHIELD | Admitting: Family Medicine

## 2017-12-26 ENCOUNTER — Encounter: Payer: Self-pay | Admitting: Family Medicine

## 2017-12-26 VITALS — BP 156/88 | HR 77 | Temp 98.2°F | Resp 14 | Wt 209.5 lb

## 2017-12-26 DIAGNOSIS — I10 Essential (primary) hypertension: Secondary | ICD-10-CM

## 2017-12-26 DIAGNOSIS — E785 Hyperlipidemia, unspecified: Secondary | ICD-10-CM | POA: Diagnosis not present

## 2017-12-26 DIAGNOSIS — E1165 Type 2 diabetes mellitus with hyperglycemia: Secondary | ICD-10-CM | POA: Diagnosis not present

## 2017-12-26 LAB — GLUCOSE, POCT (MANUAL RESULT ENTRY): POC Glucose: 185 mg/dl — AB (ref 70–99)

## 2017-12-26 LAB — COMPLETE METABOLIC PANEL WITH GFR
AG RATIO: 1.4 (calc) (ref 1.0–2.5)
ALBUMIN MSPROF: 3.8 g/dL (ref 3.6–5.1)
ALKALINE PHOSPHATASE (APISO): 69 U/L (ref 40–115)
ALT: 17 U/L (ref 9–46)
AST: 11 U/L (ref 10–35)
BUN: 16 mg/dL (ref 7–25)
CALCIUM: 9.8 mg/dL (ref 8.6–10.3)
CO2: 29 mmol/L (ref 20–32)
Chloride: 102 mmol/L (ref 98–110)
Creat: 0.88 mg/dL (ref 0.70–1.33)
GFR, Est African American: 116 mL/min/{1.73_m2} (ref >=60)
GFR, Est Non African American: 100 mL/min/{1.73_m2} (ref >=60)
GLOBULIN: 2.8 g/dL (ref 1.9–3.7)
Glucose, Bld: 203 mg/dL — ABNORMAL HIGH (ref 65–99)
POTASSIUM: 4.5 mmol/L (ref 3.5–5.3)
SODIUM: 140 mmol/L (ref 135–146)
Total Bilirubin: 1.1 mg/dL (ref 0.2–1.2)
Total Protein: 6.6 g/dL (ref 6.1–8.1)

## 2017-12-26 LAB — LIPID PANEL
CHOL/HDL RATIO: 2.7 (calc) (ref <5.0)
CHOLESTEROL: 227 mg/dL — AB (ref <200)
HDL: 83 mg/dL (ref >40)
LDL CHOLESTEROL (CALC): 127 mg/dL — AB
Non-HDL Cholesterol (Calc): 144 mg/dL (calc) — ABNORMAL HIGH (ref <130)
Triglycerides: 74 mg/dL (ref <150)

## 2017-12-26 MED ORDER — METFORMIN HCL ER (OSM) 1000 MG PO TB24: ORAL_TABLET | ORAL | 1 refills | Status: DC

## 2017-12-26 MED ORDER — EMPAGLIFLOZIN 25 MG PO TABS: 25.0000 mg | ORAL_TABLET | Freq: Every day | ORAL | 0 refills | Status: DC

## 2017-12-26 MED ORDER — ATORVASTATIN CALCIUM 40 MG PO TABS: 20.0000 mg | ORAL_TABLET | Freq: Every day | ORAL | 2 refills | Status: DC

## 2017-12-26 MED ORDER — LISINOPRIL-HYDROCHLOROTHIAZIDE 20-25 MG PO TABS: 1.0000 | ORAL_TABLET | Freq: Every day | ORAL | 0 refills | Status: DC

## 2017-12-26 MED ORDER — AMLODIPINE BESYLATE 10 MG PO TABS: 10.0000 mg | ORAL_TABLET | Freq: Every day | ORAL | 0 refills | Status: DC

## 2017-12-26 NOTE — Progress Notes (Signed)
Name: [name]   MRN: [id number]    DOB: 1966/08/19   Date:12/26/2017       Progress Note  Subjective  Chief Complaint  Chief Complaint  Patient presents with  . Diabetes  . Hypertension  . Hyperlipidemia  . Medication Refill    Diabetes  He presents for his follow-up diabetic visit. He has type 2 diabetes mellitus. His disease course has been improving. There are no hypoglycemic associated symptoms. Pertinent negatives for hypoglycemia include no headaches or sweats. Pertinent negatives for diabetes include no blurred vision, no chest pain, no fatigue, no foot paresthesias, no polydipsia and no polyuria. Pertinent negatives for diabetic complications include no CVA. Current diabetic treatment includes oral agent (triple therapy). He is compliant with treatment none of the time (He has not been taking Ozempic, but taking Jardiance and Metformin.). His weight is stable. He is following a generally unhealthy diet. He rarely participates in exercise. Frequency home blood tests: does not check blood glucose at home. His breakfast blood glucose range is generally 110-130 mg/dl. An ACE inhibitor/angiotensin II receptor blocker is being taken.  Hypertension  This is a chronic problem. The problem has been gradually worsening since onset. The problem is controlled. Pertinent negatives include no blurred vision, chest pain, headaches, orthopnea, palpitations, shortness of breath or sweats. Past treatments include ACE inhibitors, diuretics and calcium channel blockers. Compliance problems include medication cost (he has not been taking Amlodipine becasue of affordability,).  There is no history of kidney disease, CAD/MI or CVA.  Hyperlipidemia  This is a chronic problem. The problem is uncontrolled. Recent lipid tests were reviewed and are high. Exacerbating diseases include diabetes and obesity. Pertinent negatives include no chest pain, leg pain (has minor leg and foot pain on occasion.), myalgias or  shortness of breath. Current antihyperlipidemic treatment includes statins (he has not taken Atorvastatin for at least 6 months, he was on his wife's insurance but then she had to drop him and now he has insurance thorugh his employer.). Compliance problems include medication cost (Now getting on his own employer-sponsored insurance.).  Risk factors for coronary artery disease include diabetes mellitus, dyslipidemia and male sex.    Past Medical History:  Diagnosis Date  . Diabetes mellitus without complication ([place])   . Hyperlipidemia   . Hypertension     History reviewed. No pertinent surgical history.  Family History  Problem Relation Age of Onset  . Heart disease Father   . Heart attack Father   . Healthy Mother   . Prostate cancer Neg Hx   . Kidney cancer Neg Hx   . Bladder Cancer Neg Hx     Social History   Socioeconomic History  . Marital status: Married    Spouse name: Not on file  . Number of children: Not on file  . Years of education: Not on file  . Highest education level: Not on file  Social Needs  . Financial resource strain: Not on file  . Food insecurity - worry: Not on file  . Food insecurity - inability: Not on file  . Transportation needs - medical: Not on file  . Transportation needs - non-medical: Not on file  Occupational History  . Not on file  Tobacco Use  . Smoking status: Never Smoker  . Smokeless tobacco: Never Used  Substance and Sexual Activity  . Alcohol use: Yes    Alcohol/week: 0.0 oz    Comment: Occasional  . Drug use: No  . Sexual activity: Yes  Other Topics Concern  . Not on file  Social History Narrative  . Not on file     Current Outpatient Medications:  .  amLODipine (NORVASC) 10 MG tablet, Take 1 tablet (10 mg total) by mouth daily., Disp: 90 tablet, Rfl: 0 .  aspirin 81 MG tablet, Take by mouth., Disp: , Rfl:  .  atorvastatin (LIPITOR) 40 MG tablet, TAKE ONE-HALF TABLET BY  MOUTH DAILY, Disp: 45 tablet, Rfl: 2 .   glucose blood (BAYER CONTOUR NEXT TEST) test strip, BAYER CONTOUR NEXT TEST (In Vitro Strip)  1 (one) Strip Strip three times daily for 90 days  Quantity: 1;  Refills: 3   Ordered :03-Feb-2013  [name] ;  Started 03-Feb-2013 Active, Disp: , Rfl:  .  JARDIANCE 25 MG TABS tablet, TAKE 1 TABLET BY MOUTH  DAILY, Disp: 90 tablet, Rfl: 0 .  lisinopril-hydrochlorothiazide (PRINZIDE,ZESTORETIC) 20-25 MG tablet, Take 1 tablet by mouth daily., Disp: 90 tablet, Rfl: 0 .  metformin (FORTAMET) 1000 MG (OSM) 24 hr tablet, TAKE 1 TABLET BY MOUTH TWO  TIMES DAILY AFTER MEALS (Patient taking differently: TAKE 1 TABLET BY MOUTH qd), Disp: 180 tablet, Rfl: 1 .  sildenafil (REVATIO) 20 MG tablet, Take 3 to 5 tablets two hours before intercouse on an empty stomach.  Do not take with nitrates., Disp: 50 tablet, Rfl: 3 .  TOVIAZ 4 MG TB24 tablet, TAKE 1 TABLET BY MOUTH  DAILY, Disp: 90 tablet, Rfl: 0  No Known Allergies   Review of Systems  Constitutional: Negative for fatigue.  Eyes: Negative for blurred vision.  Respiratory: Negative for shortness of breath.   Cardiovascular: Negative for chest pain, palpitations and orthopnea.  Musculoskeletal: Negative for myalgias.  Neurological: Negative for headaches.  Endo/Heme/Allergies: Negative for polydipsia.      Objective  Vitals:   12/26/17 1008  BP: (!) 158/96  Pulse: 77  Resp: 14  Temp: 98.2 F (36.8 C)  TempSrc: Oral  SpO2: 99%  Weight: 209 lb 8 oz (95 kg)    Physical Exam  Constitutional: He is well-developed, well-nourished, and in no distress.  HENT:  Head: Normocephalic and atraumatic.  Cardiovascular: Normal rate, regular rhythm and normal heart sounds.  No murmur heard. Pulmonary/Chest: Effort normal and breath sounds normal. He has no wheezes.  Abdominal: Soft. Bowel sounds are normal. There is no tenderness.  Musculoskeletal: He exhibits edema.  Psychiatric: Mood, memory, affect and judgment normal.  Nursing note and vitals  reviewed.    Recent Results (from the past 2160 hour(s))  POCT Glucose (CBG)     Status: Abnormal   Collection Time: 12/26/17 10:20 AM  Result Value Ref Range   POC Glucose 185 (A) 70 - 99 mg/dl     Assessment & Plan  1. Uncontrolled type 2 diabetes mellitus with hyperglycemia (HCC) Obtain hemoglobin A1c, we'll likely need to adjust his medications for diabetes - POCT Glucose (CBG) - empagliflozin (JARDIANCE) 25 MG TABS tablet; Take 25 mg by mouth daily.  Dispense: 90 tablet; Refill: 0 - metformin (FORTAMET) 1000 MG (OSM) 24 hr tablet; TAKE 1 TABLET BY MOUTH TWO&nbsp;&nbsp;TIMES DAILY AFTER MEALS  Dispense: 180 tablet; Refill: 1 - HgB A1c  2. Essential hypertension BP elevated because he has been off his medications, refills provided and follow-up in next visit - amLODipine (NORVASC) 10 MG tablet; Take 1 tablet (10 mg total) by mouth daily.  Dispense: 90 tablet; Refill: 0 - lisinopril-hydrochlorothiazide (PRINZIDE,ZESTORETIC) 20-25 MG tablet; Take 1 tablet by mouth daily.  Dispense: 90  tablet; Refill: 0  3. Hyperlipidemia, unspecified hyperlipidemia type  - atorvastatin (LIPITOR) 40 MG tablet; Take 0.5 tablets (20 mg total) by mouth daily.  Dispense: 45 tablet; Refill: 2 - Lipid panel - COMPLETE METABOLIC PANEL WITH GFR   Roston Grunewald Asad A. [facility] 12/26/2017 10:29 AM

## 2017-12-28 ENCOUNTER — Other Ambulatory Visit: Payer: Self-pay | Admitting: Family Medicine

## 2017-12-28 DIAGNOSIS — E1165 Type 2 diabetes mellitus with hyperglycemia: Secondary | ICD-10-CM

## 2017-12-28 NOTE — Telephone Encounter (Signed)
Copied from [place]. Topic: General - Other >> Dec 28, 2017 12:30 PM Marin Olp L wrote: Reason for CRM: [facility] lost script. Please re-fax script for Metformin 500mg  360tabs 2 tabs 2x daily. They said it was originally written for 100mg  but it was authorized to be changed.  [place] (N), [place] [facility] [place]

## 2018-01-02 MED ORDER — METFORMIN HCL ER (OSM) 1000 MG PO TB24: 1000.0000 mg | ORAL_TABLET | Freq: Two times a day (BID) | ORAL | 1 refills | Status: DC

## 2018-01-16 ENCOUNTER — Encounter: Payer: Self-pay | Admitting: Family Medicine

## 2018-01-16 ENCOUNTER — Other Ambulatory Visit: Payer: Self-pay | Admitting: Family Medicine

## 2018-01-16 ENCOUNTER — Ambulatory Visit (INDEPENDENT_AMBULATORY_CARE_PROVIDER_SITE_OTHER): Payer: BLUE CROSS/BLUE SHIELD | Admitting: Family Medicine

## 2018-01-16 VITALS — BP 144/86 | HR 85 | Temp 97.4°F | Resp 14 | Ht 71.0 in | Wt 200.6 lb

## 2018-01-16 DIAGNOSIS — Z1211 Encounter for screening for malignant neoplasm of colon: Secondary | ICD-10-CM

## 2018-01-16 DIAGNOSIS — L03811 Cellulitis of head [any part, except face]: Secondary | ICD-10-CM

## 2018-01-16 DIAGNOSIS — E785 Hyperlipidemia, unspecified: Secondary | ICD-10-CM | POA: Diagnosis not present

## 2018-01-16 DIAGNOSIS — L02811 Cutaneous abscess of head [any part, except face]: Secondary | ICD-10-CM

## 2018-01-16 DIAGNOSIS — E559 Vitamin D deficiency, unspecified: Secondary | ICD-10-CM | POA: Diagnosis not present

## 2018-01-16 DIAGNOSIS — I10 Essential (primary) hypertension: Secondary | ICD-10-CM | POA: Diagnosis not present

## 2018-01-16 DIAGNOSIS — Z0000 Encounter for general adult medical examination without abnormal findings: Secondary | ICD-10-CM | POA: Diagnosis not present

## 2018-01-16 DIAGNOSIS — E1165 Type 2 diabetes mellitus with hyperglycemia: Secondary | ICD-10-CM | POA: Diagnosis not present

## 2018-01-16 MED ORDER — CLINDAMYCIN HCL 300 MG PO CAPS: 300.0000 mg | ORAL_CAPSULE | Freq: Four times a day (QID) | ORAL | 0 refills | Status: AC

## 2018-01-16 NOTE — Progress Notes (Signed)
Name: [name]   MRN: [id number]    DOB: Jun 29, 1966   Date:01/16/2018       Progress Note  Subjective  Chief Complaint  Chief Complaint  Patient presents with  . Annual Exam    Pt states he has a boil on the back of his neck where he got a hair cut at for about a week.     HPI  Pt. Presents for Complete Physical Exam. He is due for colon cancer screening. He is being followed by Urology for BPH w/ nocturia.    Past Medical History:  Diagnosis Date  . Diabetes mellitus without complication ([place])   . Hyperlipidemia   . Hypertension     No past surgical history on file.  Family History  Problem Relation Age of Onset  . Heart disease Father   . Heart attack Father   . Healthy Mother   . Prostate cancer Neg Hx   . Kidney cancer Neg Hx   . Bladder Cancer Neg Hx     Social History   Socioeconomic History  . Marital status: Married    Spouse name: Not on file  . Number of children: Not on file  . Years of education: Not on file  . Highest education level: Not on file  Social Needs  . Financial resource strain: Not on file  . Food insecurity - worry: Not on file  . Food insecurity - inability: Not on file  . Transportation needs - medical: Not on file  . Transportation needs - non-medical: Not on file  Occupational History  . Not on file  Tobacco Use  . Smoking status: Never Smoker  . Smokeless tobacco: Never Used  Substance and Sexual Activity  . Alcohol use: Yes    Alcohol/week: 0.0 oz    Comment: Occasional  . Drug use: No  . Sexual activity: Yes  Other Topics Concern  . Not on file  Social History Narrative  . Not on file     Current Outpatient Medications:  .  amLODipine (NORVASC) 10 MG tablet, Take 1 tablet (10 mg total) by mouth daily., Disp: 90 tablet, Rfl: 0 .  aspirin 81 MG tablet, Take by mouth., Disp: , Rfl:  .  atorvastatin (LIPITOR) 40 MG tablet, Take 0.5 tablets (20 mg total) by mouth daily., Disp: 45 tablet, Rfl: 2 .  empagliflozin  (JARDIANCE) 25 MG TABS tablet, Take 25 mg by mouth daily., Disp: 90 tablet, Rfl: 0 .  glucose blood (BAYER CONTOUR NEXT TEST) test strip, BAYER CONTOUR NEXT TEST (In Vitro Strip)  1 (one) Strip Strip three times daily for 90 days  Quantity: 1;  Refills: 3   Ordered :03-Feb-2013  [name] ;  Started 03-Feb-2013 Active, Disp: , Rfl:  .  lisinopril-hydrochlorothiazide (PRINZIDE,ZESTORETIC) 20-25 MG tablet, Take 1 tablet by mouth daily., Disp: 90 tablet, Rfl: 0 .  TOVIAZ 4 MG TB24 tablet, TAKE 1 TABLET BY MOUTH  DAILY, Disp: 90 tablet, Rfl: 0 .  metformin (FORTAMET) 1000 MG (OSM) 24 hr tablet, Take 1 tablet (1,000 mg total) by mouth 2 (two) times daily with a meal. TAKE 1 TABLET BY MOUTH TWO  TIMES DAILY AFTER MEALS (Patient not taking: Reported on 01/16/2018), Disp: 180 tablet, Rfl: 1 .  sildenafil (REVATIO) 20 MG tablet, Take 3 to 5 tablets two hours before intercouse on an empty stomach.  Do not take with nitrates. (Patient not taking: Reported on 01/16/2018), Disp: 50 tablet, Rfl: 3  No Known Allergies  Review of Systems  Constitutional: Negative for chills, fever and malaise/fatigue.  HENT: Negative for congestion, ear pain, sinus pain and sore throat.   Eyes: Negative for blurred vision and double vision.  Respiratory: Negative for cough, sputum production and shortness of breath.   Cardiovascular: Negative for chest pain, palpitations and leg swelling.  Gastrointestinal: Negative for abdominal pain, blood in stool, constipation, diarrhea, nausea and vomiting.  Genitourinary: Negative for dysuria, hematuria and urgency.  Musculoskeletal: Negative for back pain and neck pain.  Skin: Positive for itching (has a 'boil' on the back of his head, has been swollen and draining.).  Neurological: Negative for dizziness and headaches.  Psychiatric/Behavioral: Negative for depression. The patient is not nervous/anxious and does not have insomnia.     Objective  Vitals:   01/16/18 1057  BP: (!)  144/86  Pulse: 85  Resp: 14  Temp: (!) 97.4 F (36.3 C)  TempSrc: Oral  SpO2: 99%  Weight: 200 lb 9.6 oz (91 kg)  Height: 5\' 11"  (1.803 m)    Physical Exam  Constitutional: He is well-developed, well-nourished, and in no distress.  HENT:  Head: Normocephalic and atraumatic.  Left Ear: Tympanic membrane and ear canal normal.  Mouth/Throat: No posterior oropharyngeal erythema.  Right ear canal with cerumen impaction, TM not visualized.  Neck: Neck supple.  Cardiovascular: Normal rate, regular rhythm, S1 normal, S2 normal and normal heart sounds.  No murmur heard. Pulmonary/Chest: Effort normal. He has no wheezes. He has no rhonchi.  Abdominal: Soft. Bowel sounds are normal. There is no tenderness.  Musculoskeletal:       Right ankle: He exhibits no swelling.       Left ankle: He exhibits no swelling.  Skin: Lesion noted.     Erythematous, tender non fluctuant mass on the back of the head, mild dried drainage visible, symptoms c/w draining abscess.  Psychiatric: Mood, memory, affect and judgment normal.  Nursing note and vitals reviewed.       Assessment & Plan  1. Annual physical exam Obtain age-appropriate laboratory screening - CBC with Differential/Platelet - TSH - VITAMIN D 25 Hydroxy (Vit-D Deficiency, Fractures)  2. Screening for colon cancer  - Cologuard  3. Cellulitis and abscess of head Advised on warm compresses, starting clindamycin, if abscess does not resolve, will need referral to Gen. surgery - clindamycin (CLEOCIN) 300 MG capsule; Take 1 capsule (300 mg total) by mouth 4 (four) times daily for 10 days.  Dispense: 40 capsule; Refill: 0   Libertie Hausler Asad A. [facility] 01/16/2018 11:08 AM

## 2018-01-17 ENCOUNTER — Encounter: Payer: Self-pay | Admitting: General Surgery

## 2018-01-17 ENCOUNTER — Ambulatory Visit (INDEPENDENT_AMBULATORY_CARE_PROVIDER_SITE_OTHER): Payer: BLUE CROSS/BLUE SHIELD | Admitting: General Surgery

## 2018-01-17 ENCOUNTER — Other Ambulatory Visit: Payer: Self-pay | Admitting: Family Medicine

## 2018-01-17 ENCOUNTER — Telehealth: Payer: Self-pay | Admitting: *Deleted

## 2018-01-17 ENCOUNTER — Telehealth: Payer: Self-pay | Admitting: Family Medicine

## 2018-01-17 ENCOUNTER — Other Ambulatory Visit: Payer: Self-pay

## 2018-01-17 VITALS — BP 158/82 | HR 96 | Resp 16 | Ht 71.0 in | Wt 205.0 lb

## 2018-01-17 DIAGNOSIS — L02811 Cutaneous abscess of head [any part, except face]: Secondary | ICD-10-CM | POA: Diagnosis not present

## 2018-01-17 DIAGNOSIS — L03811 Cellulitis of head [any part, except face]: Principal | ICD-10-CM

## 2018-01-17 LAB — TSH: TSH: 1.19 m[IU]/L (ref 0.40–4.50)

## 2018-01-17 LAB — CBC WITH DIFFERENTIAL/PLATELET
BASOS PCT: 0.8 %
Basophils Absolute: 74 cells/uL (ref 0–200)
EOS PCT: 0.4 %
Eosinophils Absolute: 37 cells/uL (ref 15–500)
HEMATOCRIT: 44.2 % (ref 38.5–50.0)
Hemoglobin: 15.5 g/dL (ref 13.2–17.1)
Lymphs Abs: 1172 cells/uL (ref 850–3900)
MCH: 31.5 pg (ref 27.0–33.0)
MCHC: 35.1 g/dL (ref 32.0–36.0)
MCV: 89.8 fL (ref 80.0–100.0)
MPV: 10.1 fL (ref 7.5–12.5)
Monocytes Relative: 9 %
Neutro Abs: 7180 cells/uL (ref 1500–7800)
Neutrophils Relative %: 77.2 %
PLATELETS: 319 10*3/uL (ref 140–400)
RBC: 4.92 10*6/uL (ref 4.20–5.80)
RDW: 11.3 % (ref 11.0–15.0)
Total Lymphocyte: 12.6 %
WBC: 9.3 10*3/uL (ref 3.8–10.8)
WBCMIX: 837 {cells}/uL (ref 200–950)

## 2018-01-17 LAB — VITAMIN D 25 HYDROXY (VIT D DEFICIENCY, FRACTURES): VIT D 25 HYDROXY: 11 ng/mL — AB (ref 30–100)

## 2018-01-17 MED ORDER — VITAMIN D (ERGOCALCIFEROL) 1.25 MG (50000 UNIT) PO CAPS: 50000.0000 [IU] | ORAL_CAPSULE | ORAL | 0 refills | Status: DC

## 2018-01-17 NOTE — Telephone Encounter (Signed)
Called wife on # [phone] @ 10:04am Left voice message informing pt to return call. Need more information on what appt is needed and what procedure he is needing.

## 2018-01-17 NOTE — Telephone Encounter (Signed)
Copied from [place]. Topic: Referral - Request >> Jan 17, 2018  9:57 AM Cecelia Byars, NT wrote: Reason for CBU:[id number] wife called and said he would like to know if he can have an appointment as soon as possible with to have the procedure  done  , please call her at [phone]

## 2018-01-17 NOTE — Progress Notes (Signed)
Patient ID: [name], male   DOB: 1966/10/04, 52 y.o.   MRN: [id number]  Chief Complaint  Patient presents with  . Abscess    HPI [name] is a 52 y.o. male.  Here for evaluation of an abscess on his head referred by Dr [name]. He states he noticed irritation and pain back of head since Monday and has progressively gotten worse. Started antibiotics yesterday. No drainage noted.  He states he had his hair cut last Wednesday.  HPI  Past Medical History:  Diagnosis Date  . Diabetes mellitus without complication ([place])   . Hyperlipidemia   . Hypertension     No past surgical history on file.  Family History  Problem Relation Age of Onset  . Heart disease Father   . Heart attack Father   . Healthy Mother   . Prostate cancer Neg Hx   . Kidney cancer Neg Hx   . Bladder Cancer Neg Hx     Social History Social History   Tobacco Use  . Smoking status: Never Smoker  . Smokeless tobacco: Never Used  Substance Use Topics  . Alcohol use: Yes    Alcohol/week: 0.0 oz    Comment: Occasional  . Drug use: No    No Known Allergies  Current Outpatient Medications  Medication Sig Dispense Refill  . amLODipine (NORVASC) 10 MG tablet Take 1 tablet (10 mg total) by mouth daily. 90 tablet 0  . aspirin 81 MG tablet Take by mouth.    Marland Kitchen atorvastatin (LIPITOR) 40 MG tablet Take 0.5 tablets (20 mg total) by mouth daily. 45 tablet 2  . clindamycin (CLEOCIN) 300 MG capsule Take 1 capsule (300 mg total) by mouth 4 (four) times daily for 10 days. 40 capsule 0  . empagliflozin (JARDIANCE) 25 MG TABS tablet Take 25 mg by mouth daily. 90 tablet 0  . glucose blood (BAYER CONTOUR NEXT TEST) test strip BAYER CONTOUR NEXT TEST (In Vitro Strip)  1 (one) Strip Strip three times daily for 90 days  Quantity: 1;  Refills: 3   Ordered :03-Feb-2013  [name] ;  Started 03-Feb-2013 Active    . lisinopril-hydrochlorothiazide (PRINZIDE,ZESTORETIC) 20-25 MG tablet Take 1 tablet by mouth daily. 90  tablet 0  . metformin (FORTAMET) 1000 MG (OSM) 24 hr tablet Take 1 tablet (1,000 mg total) by mouth 2 (two) times daily with a meal. TAKE 1 TABLET BY MOUTH TWO  TIMES DAILY AFTER MEALS 180 tablet 1  . sildenafil (REVATIO) 20 MG tablet Take 3 to 5 tablets two hours before intercouse on an empty stomach.  Do not take with nitrates. 50 tablet 3  . TOVIAZ 4 MG TB24 tablet TAKE 1 TABLET BY MOUTH  DAILY 90 tablet 0  . Vitamin D, Ergocalciferol, (DRISDOL) 50000 units CAPS capsule Take 1 capsule (50,000 Units total) by mouth every 7 (seven) days. For 12 weeks 12 capsule 0   No current facility-administered medications for this visit.     Review of Systems Review of Systems  Constitutional: Negative.   Respiratory: Negative.   Cardiovascular: Negative.     Blood pressure (!) 158/82, pulse 96, resp. rate 16, height 5\' 11"  (1.803 m), weight 205 lb (93 kg).  Physical Exam Physical Exam  Constitutional: He is oriented to person, place, and time. He appears well-developed and well-nourished.  HENT:  Head:    Mouth/Throat: Oropharynx is clear and moist.  Eyes: Conjunctivae are normal. No scleral icterus.  Neck: Neck supple.  Lymphadenopathy:  He has no cervical adenopathy.  Neurological: He is alert and oriented to person, place, and time.  Skin: Skin is warm and dry.  2 x 3.5 cm left of midline occipital scalp  Psychiatric: His behavior is normal.    Data Reviewed PCP notes of January 16, 2018.  Assessment    Abscess left occipital scalp.    Plan    Plans for incision and drainage were reviewed.  Pros and cons discussed.  The area was cleansed with ChloraPrep.  20 cc of 0.5% Xylocaine with 0.25% Marcaine with 1-200,000 units of epinephrine was well-tolerated.  The area was recleansed with ChloraPrep and draped.  A 1.5 cm transverse incision was made.  About 2 cc of thick purulent material was obtained.  Culture submitted.  A hemostat was used to break up internal loculations.  Scant  bleeding.  Controlled with direct pressure.  Dry dressing applied.    HPI, Physical Exam, Assessment and Plan have been scribed under the direction and in the presence of Robert Bellow, MD. Karie Fetch, RN  I have completed the exam and reviewed the above documentation for accuracy and completeness.  I agree with the above.  Haematologist has been used and any errors in dictation or transcription are unintentional.  Hervey Ard, M.D., F.A.C.S.  [name] 01/17/2018, 7:57 PM

## 2018-01-17 NOTE — Patient Instructions (Addendum)
The patient is aware to call back for any questions or concerns.  The patient is aware to use a heating pad as needed for comfort. Culture sent

## 2018-01-17 NOTE — Telephone Encounter (Signed)
Wife calls to say there is a knot. She states she has not gotten home to see it yet since incision and drainage. Procedure reviewed. She stats if it looks different than it did she will call back today or in the morning. Aware area left open to drain and culture sent.

## 2018-01-19 ENCOUNTER — Emergency Department
Admission: EM | Admit: 2018-01-19 | Discharge: 2018-01-19 | Disposition: A | Discharge status: Home / Self Care | Payer: BLUE CROSS/BLUE SHIELD | Attending: Emergency Medicine | Admitting: Emergency Medicine

## 2018-01-19 ENCOUNTER — Encounter: Payer: Self-pay | Admitting: Emergency Medicine

## 2018-01-19 ENCOUNTER — Other Ambulatory Visit: Payer: Self-pay

## 2018-01-19 DIAGNOSIS — L02811 Cutaneous abscess of head [any part, except face]: Secondary | ICD-10-CM | POA: Diagnosis not present

## 2018-01-19 DIAGNOSIS — Z7982 Long term (current) use of aspirin: Secondary | ICD-10-CM | POA: Insufficient documentation

## 2018-01-19 DIAGNOSIS — Z7984 Long term (current) use of oral hypoglycemic drugs: Secondary | ICD-10-CM | POA: Diagnosis not present

## 2018-01-19 DIAGNOSIS — E119 Type 2 diabetes mellitus without complications: Secondary | ICD-10-CM | POA: Insufficient documentation

## 2018-01-19 DIAGNOSIS — I10 Essential (primary) hypertension: Secondary | ICD-10-CM | POA: Insufficient documentation

## 2018-01-19 NOTE — ED Provider Notes (Signed)
[facility] Emergency Department Provider Note  ___________________________________________   First MD Initiated Contact with Patient 01/19/18 [phone]     (approximate)  I have reviewed the triage vital signs and the nursing notes.   HISTORY  Chief Complaint Abscess   HPI [name] is a 52 y.o. male presents to the ED this morning with complaint of abscess to his scalp.  He states his PCP referred him to a surgeon and area was I&D 2 days ago.  States he feels there is still more pus to be removed.  He continues to take clindamycin 300 mg 4 times daily as prescribed by his PCP.  He has been using heating pad to his scalp and frequently.  He denies any fever or chills.  He rates his pain as 5/10.   Past Medical History:  Diagnosis Date  . Diabetes mellitus without complication ([place])   . Hyperlipidemia   . Hypertension     Patient Active Problem List   Diagnosis Date Noted  . Abscess, occipital 01/17/2018  . Lower urinary tract symptoms (LUTS) 02/09/2017  . Hypertension 07/05/2015  . Diabetes mellitus type 2, uncontrolled ([place]) 07/05/2015  . HLD (hyperlipidemia) 07/05/2015  . Non-smoker 07/05/2015    History reviewed. No pertinent surgical history.  Prior to Admission medications   Medication Sig Start Date End Date Taking? Authorizing Provider  amLODipine (NORVASC) 10 MG tablet Take 1 tablet (10 mg total) by mouth daily. 12/26/17   [name], MD  aspirin 81 MG tablet Take by mouth.    [provider]  atorvastatin (LIPITOR) 40 MG tablet Take 0.5 tablets (20 mg total) by mouth daily. 12/26/17   [name], MD  clindamycin (CLEOCIN) 300 MG capsule Take 1 capsule (300 mg total) by mouth 4 (four) times daily for 10 days. 01/16/18 01/26/18  [name], MD  empagliflozin (JARDIANCE) 25 MG TABS tablet Take 25 mg by mouth daily. 12/26/17   Rochel Brome A, MD  glucose blood (BAYER CONTOUR NEXT TEST) test strip BAYER CONTOUR  NEXT TEST (In Vitro Strip)  1 (one) Strip Strip three times daily for 90 days  Quantity: 1;  Refills: 3   Ordered :03-Feb-2013  [name] ;  Started 03-Feb-2013 Active 02/03/13   [provider]  lisinopril-hydrochlorothiazide (PRINZIDE,ZESTORETIC) 20-25 MG tablet Take 1 tablet by mouth daily. 12/26/17   [name], MD  metformin (FORTAMET) 1000 MG (OSM) 24 hr tablet Take 1 tablet (1,000 mg total) by mouth 2 (two) times daily with a meal. TAKE 1 TABLET BY MOUTH TWO  TIMES DAILY AFTER MEALS 01/02/18   [name], MD  sildenafil (REVATIO) 20 MG tablet Take 3 to 5 tablets two hours before intercouse on an empty stomach.  Do not take with nitrates. 02/27/17   Zara Council A, PA-C  TOVIAZ 4 MG TB24 tablet TAKE 1 TABLET BY MOUTH  DAILY 05/07/17   Zara Council A, PA-C  Vitamin D, Ergocalciferol, (DRISDOL) 50000 units CAPS capsule Take 1 capsule (50,000 Units total) by mouth every 7 (seven) days. For 12 weeks 01/17/18   [name], MD    Allergies Patient has no known allergies.  Family History  Problem Relation Age of Onset  . Heart disease Father   . Heart attack Father   . Healthy Mother   . Prostate cancer Neg Hx   . Kidney cancer Neg Hx   . Bladder Cancer Neg Hx  Social History Social History   Tobacco Use  . Smoking status: Never Smoker  . Smokeless tobacco: Never Used  Substance Use Topics  . Alcohol use: Yes    Alcohol/week: 0.0 oz    Comment: Occasional  . Drug use: No    Review of Systems Constitutional: No fever/chills Eyes: No visual changes. Cardiovascular: Denies chest pain. Respiratory: Denies shortness of breath. Skin: Positive for abscess. Neurological: Negative for headaches ___________________________________________   PHYSICAL EXAM:  VITAL SIGNS: ED Triage Vitals  Enc Vitals Group     BP 01/19/18 0647 (!) 178/86     Pulse Rate 01/19/18 0647 85     Resp 01/19/18 0647 18     Temp 01/19/18 0647 (!) 97.5 F (36.4  C)     Temp Source 01/19/18 0647 Oral     SpO2 01/19/18 0647 100 %     Weight 01/19/18 0647 205 lb (93 kg)     Height 01/19/18 0647 5\' 11"  (1.803 m)     Head Circumference --      Peak Flow --      Pain Score 01/19/18 0648 5     Pain Loc --      Pain Edu? --      Excl. in [place]? --    Constitutional: Alert and oriented. Well appearing and in no acute distress. Eyes: Conjunctivae are normal.  Head: Atraumatic. Neck: No stridor.   Hematological/Lymphatic/Immunilogical: No cervical lymphadenopathy. Cardiovascular: Normal rate, regular rhythm. Grossly normal heart sounds.  Good peripheral circulation. Respiratory: Normal respiratory effort.  No retractions. Lungs CTAB. Musculoskeletal: Moves upper and lower extremities then difficulty normal gait was noted. Neurologic:  Normal speech and language. No gross focal neurologic deficits are appreciated.  Skin:  Skin is warm, dry.  Posterior scalp left side there is a healing incision without active drainage.  Area surrounding this is non-erythematous.  Area is firm to touch.  No fluctuance is noted in the entire area.  Normal tenderness on palpation. Psychiatric: Mood and affect are normal. Speech and behavior are normal.  ____________________________________________   LABS (all labs ordered are listed, but only abnormal results are displayed)  Labs Reviewed - No data to display  PROCEDURES  Procedure(s) performed: None  Procedures  Critical Care performed: No  ____________________________________________   INITIAL IMPRESSION / ASSESSMENT AND PLAN / ED COURSE  Patient will continue taking clindamycin 300 mg 4 times daily as directed by his doctor.  He will follow-up with Dr. Tollie Pizza if any continued problems.  He is instructed to use moist warm compresses to his scalp frequently for the next 2 days.  ____________________________________________   FINAL CLINICAL IMPRESSION(S) / ED DIAGNOSES  Final diagnoses:  Abscess of scalp       ED Discharge Orders    None       Note:  This document was prepared using Dragon voice recognition software and may include unintentional dictation errors.    Johnn Hai, PA-C 01/19/18 0915    Schuyler Amor, MD 01/19/18 1332

## 2018-01-19 NOTE — ED Triage Notes (Signed)
Pt saw his PCP about an abscess earlier this week and was referred to a doctor who drained it. Pt reports that he still feels that he has more fluid that needs to be drained. Pt is in NAD.

## 2018-01-19 NOTE — ED Notes (Signed)
Pt concerned due to having continued swelling to the boil on the back of his head even after going to the MD and having it drained, area is swollen at base of his scalp, no active drainage at this time

## 2018-01-19 NOTE — Discharge Instructions (Signed)
Continue taking your antibiotic as directed clindamycin 300 mg 4 times daily until finished.  Begin using warm moist compresses to your scalp frequently.  Follow-up with Dr. Bary Castilla if any continued problems

## 2018-01-21 LAB — ANAEROBIC AND AEROBIC CULTURE

## 2018-03-27 ENCOUNTER — Telehealth: Payer: Self-pay | Admitting: Family Medicine

## 2018-03-27 ENCOUNTER — Other Ambulatory Visit: Payer: Self-pay | Admitting: Family Medicine

## 2018-03-27 ENCOUNTER — Other Ambulatory Visit: Payer: Self-pay

## 2018-03-27 DIAGNOSIS — I10 Essential (primary) hypertension: Secondary | ICD-10-CM

## 2018-03-27 MED ORDER — AMLODIPINE BESYLATE 10 MG PO TABS: 10.0000 mg | ORAL_TABLET | Freq: Every day | ORAL | 0 refills | Status: DC

## 2018-03-27 MED ORDER — LISINOPRIL-HYDROCHLOROTHIAZIDE 20-25 MG PO TABS: 1.0000 | ORAL_TABLET | Freq: Every day | ORAL | 0 refills | Status: DC

## 2018-03-27 NOTE — Telephone Encounter (Signed)
Rx refill request attached

## 2018-03-27 NOTE — Telephone Encounter (Signed)
Copied from [place] [phone]. Topic: General - Other >> Mar 27, 2018  9:22 AM Yvette Rack wrote: Reason for CRM: patient has an OV with [facility] on 06-26-18 and the [facility] would like for someone to call them or fax them the appt information so that they can refill pt blood pressure medicine   >> Mar 27, 2018  3:04 PM Cleaster Corin, NT wrote: Pt. Calling back about med. Refill

## 2018-03-27 NOTE — Telephone Encounter (Signed)
Spoke with patient and I informed him that the medication request was in the dr Fabio Bering and that we are just waiting on the dr reply. He wanted assurance that this medication will be refilled today because he took his last pill this morning. I did inform him that I could not assure him because we have 48 hrs to respond to any refill request. I also suggested that he call at least a week before running out of medications and he verbalized understanding. But he is insisting that his medication be sent today.

## 2018-03-27 NOTE — Telephone Encounter (Signed)
Needs to be seen tomorrow. Due for hgbA1C

## 2018-03-27 NOTE — Telephone Encounter (Signed)
Pt. Is out of this medication and needs more.  Sending High priority

## 2018-03-27 NOTE — Telephone Encounter (Signed)
Rx refill requests 

## 2018-03-27 NOTE — Telephone Encounter (Signed)
>>   Mar 27, 2018  9:09 AM Oneta Rack wrote: Relation to pt: self Call back number: [phone] Pharmacy: [facility] [place] [phone] (Phone) [phone] (Fax)  Reason for call:  Patient requesting 4 month supply of lisinopril-hydrochlorothiazide (PRINZIDE,ZESTORETIC) 20-25 MG tablet due to the next available transfer of care appointment being so far out, patient will consult with his wife and call back to schedule a transfer of care appointment, please advise

## 2018-03-27 NOTE — Telephone Encounter (Signed)
Copied from [place] [phone]. Topic: General - Other >> Mar 27, 2018  9:22 AM [name] wrote: Reason for CRM: patient has an OV with [facility] on 06-26-18 and the [facility] would like for someone to call them or fax them the appt information so that they can refill pt blood pressure medicine

## 2018-03-27 NOTE — Telephone Encounter (Signed)
Thank you for explaining our refill process. I have sent this to Dr. Ancil Boozer high alert this morning and will remind her once she is done seeing patients as well.

## 2018-03-28 NOTE — Telephone Encounter (Signed)
Called to inform pt that prescription was sent in last night. Wife [name] thank you and stated that they actually picked up the prescripiton last night.

## 2018-04-04 ENCOUNTER — Other Ambulatory Visit: Payer: Self-pay

## 2018-04-04 NOTE — Telephone Encounter (Signed)
Refill request for general medication. Vitamin D to [facility].   Last office visit: 01/16/2018   Follow up on 04/10/2018

## 2018-04-05 MED ORDER — VITAMIN D (ERGOCALCIFEROL) 1.25 MG (50000 UNIT) PO CAPS: 50000.0000 [IU] | ORAL_CAPSULE | ORAL | 0 refills | Status: DC

## 2018-04-10 ENCOUNTER — Encounter: Payer: Self-pay | Admitting: Nurse Practitioner

## 2018-04-10 ENCOUNTER — Ambulatory Visit (INDEPENDENT_AMBULATORY_CARE_PROVIDER_SITE_OTHER): Payer: BLUE CROSS/BLUE SHIELD | Admitting: Nurse Practitioner

## 2018-04-10 VITALS — BP 134/86 | HR 88 | Temp 98.1°F | Resp 18 | Ht 71.0 in | Wt 208.7 lb

## 2018-04-10 DIAGNOSIS — I10 Essential (primary) hypertension: Secondary | ICD-10-CM

## 2018-04-10 DIAGNOSIS — E785 Hyperlipidemia, unspecified: Secondary | ICD-10-CM

## 2018-04-10 DIAGNOSIS — N521 Erectile dysfunction due to diseases classified elsewhere: Secondary | ICD-10-CM | POA: Diagnosis not present

## 2018-04-10 DIAGNOSIS — E1165 Type 2 diabetes mellitus with hyperglycemia: Secondary | ICD-10-CM

## 2018-04-10 DIAGNOSIS — E559 Vitamin D deficiency, unspecified: Secondary | ICD-10-CM | POA: Diagnosis not present

## 2018-04-10 DIAGNOSIS — R399 Unspecified symptoms and signs involving the genitourinary system: Secondary | ICD-10-CM

## 2018-04-10 DIAGNOSIS — L97521 Non-pressure chronic ulcer of other part of left foot limited to breakdown of skin: Secondary | ICD-10-CM

## 2018-04-10 DIAGNOSIS — N529 Male erectile dysfunction, unspecified: Secondary | ICD-10-CM | POA: Insufficient documentation

## 2018-04-10 LAB — POCT GLYCOSYLATED HEMOGLOBIN (HGB A1C): Hemoglobin A1C: 9.1

## 2018-04-10 MED ORDER — EMPAGLIFLOZIN 25 MG PO TABS: 25.0000 mg | ORAL_TABLET | Freq: Every day | ORAL | 0 refills | Status: DC

## 2018-04-10 MED ORDER — LISINOPRIL-HYDROCHLOROTHIAZIDE 20-25 MG PO TABS: 1.0000 | ORAL_TABLET | Freq: Every day | ORAL | 0 refills | Status: DC

## 2018-04-10 MED ORDER — METFORMIN HCL ER (OSM) 1000 MG PO TB24: 1000.0000 mg | ORAL_TABLET | Freq: Two times a day (BID) | ORAL | 0 refills | Status: DC

## 2018-04-10 MED ORDER — ATORVASTATIN CALCIUM 40 MG PO TABS: 20.0000 mg | ORAL_TABLET | Freq: Every day | ORAL | 0 refills | Status: DC

## 2018-04-10 MED ORDER — AMLODIPINE BESYLATE 10 MG PO TABS: 10.0000 mg | ORAL_TABLET | Freq: Every day | ORAL | 0 refills | Status: DC

## 2018-04-10 NOTE — Progress Notes (Signed)
Name: [name]   MRN: [id number]    DOB: 1965/12/25   Date:04/10/2018       Progress Note  Subjective  Chief Complaint  Chief Complaint  Patient presents with  . Diabetes    follow up  . Hypertension    HPI  Diabetes Mellitus Patient is rx Jardiance 25mg  daily, metformin 1000 mg BID. Takes medications as prescribed except for metformin only taking once a day- without missed doses.  Diet: states last week started to try to get off sugar and salt. Has been eating fast food because he needs something to quick  Checks blood sugars if he is feeling bad.  Denies polyphagia, polydipsia. Endorses polyuria occasionally.  Lab Results  Component Value Date   HGBA1C 9.1 04/10/2018     Hyperlipidemia Patient rx atorvastatin Takes medications as prescribed with 0 missed doses a month.  Diet: vegetables once a week. Eats out 7 times a week. Fried foods more frequently recently  Denies myalgias Lab Results  Component Value Date   CHOL 227 (H) 12/26/2017   HDL 83 12/26/2017   LDLCALC 127 (H) 12/26/2017   TRIG 74 12/26/2017   CHOLHDL 2.7 12/26/2017     Hypertension Patient is on amlodipine, lisinopril-HCTZ.  Takes medications as prescribed without missed doses a month.  Was not earlier compliant  But has been working on low-salt diet.  Doesn't check blood pressures at home  Denies chest pain, headaches, blurry vision.  BP Readings from Last 3 Encounters:  04/10/18 134/86  01/19/18 135/71  01/17/18 (!) 158/82    OAB Was taking Lisbeth Ply states has resolved doesn't run to the bathroom a lot anymore.    ED Patient used to take sildenafil states does not need anymore   Vitamin D  States was low in February has been taking the vitamin D weekly last dose was 2 days ago.   Patient Active Problem List   Diagnosis Date Noted  . Abscess, occipital 01/17/2018  . Lower urinary tract symptoms (LUTS) 02/09/2017  . Hypertension 07/05/2015  . Diabetes mellitus type 2, uncontrolled  ([place]) 07/05/2015  . HLD (hyperlipidemia) 07/05/2015  . Non-smoker 07/05/2015    Past Medical History:  Diagnosis Date  . Diabetes mellitus without complication ([place])   . Hyperlipidemia   . Hypertension     No past surgical history on file.  Social History   Tobacco Use  . Smoking status: Never Smoker  . Smokeless tobacco: Never Used  Substance Use Topics  . Alcohol use: Yes    Alcohol/week: 0.0 oz    Comment: Occasional     Current Outpatient Medications:  .  amLODipine (NORVASC) 10 MG tablet, Take 1 tablet (10 mg total) by mouth daily., Disp: 90 tablet, Rfl: 0 .  aspirin 81 MG tablet, Take by mouth., Disp: , Rfl:  .  atorvastatin (LIPITOR) 40 MG tablet, Take 0.5 tablets (20 mg total) by mouth daily., Disp: 90 tablet, Rfl: 0 .  empagliflozin (JARDIANCE) 25 MG TABS tablet, Take 25 mg by mouth daily., Disp: 90 tablet, Rfl: 0 .  glucose blood (BAYER CONTOUR NEXT TEST) test strip, BAYER CONTOUR NEXT TEST (In Vitro Strip)  1 (one) Strip Strip three times daily for 90 days  Quantity: 1;  Refills: 3   Ordered :03-Feb-2013  [name] ;  Started 03-Feb-2013 Active, Disp: , Rfl:  .  lisinopril-hydrochlorothiazide (PRINZIDE,ZESTORETIC) 20-25 MG tablet, Take 1 tablet by mouth daily., Disp: 30 tablet, Rfl: 0 .  metformin (FORTAMET) 1000 MG (OSM) 24  hr tablet, Take 1 tablet (1,000 mg total) by mouth 2 (two) times daily with a meal. TAKE 1 TABLET BY MOUTH TWO  TIMES DAILY AFTER MEALS, Disp: 180 tablet, Rfl: 0 .  sildenafil (REVATIO) 20 MG tablet, Take 3 to 5 tablets two hours before intercouse on an empty stomach.  Do not take with nitrates., Disp: 50 tablet, Rfl: 3 .  Vitamin D, Ergocalciferol, (DRISDOL) 50000 units CAPS capsule, Take 1 capsule (50,000 Units total) by mouth every 7 (seven) days. For 12 weeks, Disp: 12 capsule, Rfl: 0  No Known Allergies  ROS  Constitutional: Negative for fever or weight change.  Respiratory: Negative for cough and shortness of breath.    Cardiovascular: Negative for chest pain or palpitations.  Gastrointestinal: Negative for abdominal pain, no bowel changes.  Musculoskeletal: Negative for gait problem or joint swelling.  Skin: Negative for rash.  Neurological: Negative for dizziness or headache.  No other specific complaints in a complete review of systems (except as listed in HPI above).  Objective  Vitals:   04/10/18 0947 04/10/18 1053  BP: (!) 142/84 134/86  Pulse: 88   Resp: 18   Temp: 98.1 F (36.7 C)   TempSrc: Oral   SpO2: 98%   Weight: 208 lb 11.2 oz (94.7 kg)   Height: 5\' 11"  (1.803 m)      Body mass index is 29.11 kg/m.  Nursing Note and Vital Signs reviewed.  Physical Exam  Feet:  Left Foot:  Skin Integrity: Positive for ulcer (ulcer noted to left lateral aspect of foot, no fatty tissue exposed, no drainage). Negative for erythema or warmth.     Constitutional: Patient appears well-developed and well-nourished.  No distress.  Cardiovascular: Normal rate, regular rhythm, S1/S2 present.  No murmur or rub heard.  Pulmonary/Chest: Effort normal and breath sounds clear. No respiratory distress or retractions. Abdominal: Soft and non-tender, bowel sounds present  Psychiatric: Patient has a normal mood and affect. behavior is normal. Judgment and thought content normal.  Results for orders placed or performed in visit on 04/10/18 (from the past 72 hour(s))  POCT HgB A1C     Status: None   Collection Time: 04/10/18 10:17 AM  Result Value Ref Range   Hemoglobin A1C 9.1     Assessment & Plan  1. Uncontrolled type 2 diabetes mellitus with hyperglycemia ([place]) Discussed need for triple therapy patient will increase metformin to 2000mg  daily and requesting trial to diet and exercise and add triple therapy in 3 months if not improved. Will check blood sugars at home fastin 2-3 times a week to get baseline and return sooner if still maintaining over 180 - POCT HgB A1C - empagliflozin (JARDIANCE) 25  MG TABS tablet; Take 25 mg by mouth daily.  Dispense: 90 tablet; Refill: 0 - metformin (FORTAMET) 1000 MG (OSM) 24 hr tablet; Take 1 tablet (1,000 mg total) by mouth 2 (two) times daily with a meal. TAKE 1 TABLET BY MOUTH TWO  TIMES DAILY AFTER MEALS  Dispense: 180 tablet; Refill: 0 - Ambulatory referral to Podiatry  2. Vitamin D deficiency  - Vitamin D (25 hydroxy)  4. Essential hypertension DASH, discussed increasing lisinopril- will adhere to DASH guidelines and recheck - amLODipine (NORVASC) 10 MG tablet; Take 1 tablet (10 mg total) by mouth daily.  Dispense: 90 tablet; Refill: 0 -lisinopril 20 HCTZ 25mg   5. Hyperlipidemia, unspecified hyperlipidemia type Discussed diet and exercise; will recheck next visit  - atorvastatin (LIPITOR) 40 MG tablet; Take 0.5 tablets (20 mg  total) by mouth daily.  Dispense: 90 tablet; Refill: 0  6. Skin ulcer of left foot, limited to breakdown of skin ([place])  - Ambulatory referral to Podiatry  7. Lower urinary tract symptoms (LUTS) Used toviaz in the past, resolved   8. Erectile dysfunction due to diseases classified elsewhere Used sildinafil in the past, does not require anymore     -Red flags and when to present for emergency care or RTC including fever >101.30F, chest pain, shortness of breath, new/worsening/un-resolving symptoms,  reviewed with patient at time of visit. Follow up and care instructions discussed and provided in AVS. -Reviewed Health Maintenance: patient states never received cologuard-

## 2018-04-10 NOTE — Patient Instructions (Signed)
- Check blood sugar fasting (first thing in the mornings before eating) 2-3 times a week (goal should be 70-170) - Increase metformin to 2000 mg a day; should also add another medication but you want to try exercise and diet- so double down on that    Foods and drinks to limit include  . fried foods and other foods high in saturated fat and trans fat  . foods high in salt, also called sodium  . sweets, such as baked goods, candy, and ice cream  . beverages with added sugars, such as juice, regular soda, and regular sports or energy drinks  Drink water instead of sweetened beverages. Consider using a sugar substitute in your coffee or tea.   Instead, eat carbohydrates from fruit, vegetables, whole grains, beans, and low-fat or nonfat milk. Choose healthy carbohydrates, such as fruit, vegetables, whole grains, beans, and low-fat milk, as part of your diabetes meal plan.    DASH Eating Plan DASH stands for "Dietary Approaches to Stop Hypertension." The DASH eating plan is a healthy eating plan that has been shown to reduce high blood pressure (hypertension). It may also reduce your risk for type 2 diabetes, heart disease, and stroke. The DASH eating plan may also help with weight loss. What are tips for following this plan? General guidelines  Avoid eating more than 2,300 mg (milligrams) of salt (sodium) a day. If you have hypertension, you may need to reduce your sodium intake to 1,500 mg a day.  Limit alcohol intake to no more than 1 drink a day for nonpregnant women and 2 drinks a day for men. One drink equals 12 oz of beer, 5 oz of wine, or 1 oz of hard liquor.  Work with your health care provider to maintain a healthy body weight or to lose weight. Ask what an ideal weight is for you.  Get at least 30 minutes of exercise that causes your heart to beat faster (aerobic exercise) most days of the week. Activities may include walking, swimming, or biking.  Work with your health care  provider or diet and nutrition specialist (dietitian) to adjust your eating plan to your individual calorie needs. Reading food labels  Check food labels for the amount of sodium per serving. Choose foods with less than 5 percent of the Daily Value of sodium. Generally, foods with less than 300 mg of sodium per serving fit into this eating plan.  To find whole grains, look for the word "whole" as the first word in the ingredient list. Shopping  Buy products labeled as "low-sodium" or "no salt added."  Buy fresh foods. Avoid canned foods and premade or frozen meals. Cooking  Avoid adding salt when cooking. Use salt-free seasonings or herbs instead of table salt or sea salt. Check with your health care provider or pharmacist before using salt substitutes.  Do not fry foods. Cook foods using healthy methods such as baking, boiling, grilling, and broiling instead.  Cook with heart-healthy oils, such as olive, canola, soybean, or sunflower oil. Meal planning   Eat a balanced diet that includes: ? 5 or more servings of fruits and vegetables each day. At each meal, try to fill half of your plate with fruits and vegetables. ? Up to 6-8 servings of whole grains each day. ? Less than 6 oz of lean meat, poultry, or fish each day. A 3-oz serving of meat is about the same size as a deck of cards. One egg equals 1 oz. ? 2 servings of  low-fat dairy each day. ? A serving of nuts, seeds, or beans 5 times each week. ? Heart-healthy fats. Healthy fats called Omega-3 fatty acids are found in foods such as flaxseeds and coldwater fish, like sardines, salmon, and mackerel.  Limit how much you eat of the following: ? Canned or prepackaged foods. ? Food that is high in trans fat, such as fried foods. ? Food that is high in saturated fat, such as fatty meat. ? Sweets, desserts, sugary drinks, and other foods with added sugar. ? Full-fat dairy products.  Do not salt foods before eating.  Try to eat at  least 2 vegetarian meals each week.  Eat more home-cooked food and less restaurant, buffet, and fast food.  When eating at a restaurant, ask that your food be prepared with less salt or no salt, if possible. What foods are recommended? The items listed may not be a complete list. Talk with your dietitian about what dietary choices are best for you. Grains Whole-grain or whole-wheat bread. Whole-grain or whole-wheat pasta. Brown rice. [place]. Bulgur. Whole-grain and low-sodium cereals. Pita bread. Low-fat, low-sodium crackers. Whole-wheat flour tortillas. Vegetables Fresh or frozen vegetables (raw, steamed, roasted, or grilled). Low-sodium or reduced-sodium tomato and vegetable juice. Low-sodium or reduced-sodium tomato sauce and tomato paste. Low-sodium or reduced-sodium canned vegetables. Fruits All fresh, dried, or frozen fruit. Canned fruit in natural juice (without added sugar). Meat and other protein foods Skinless chicken or [place]. Ground chicken or [place]. Pork with fat trimmed off. Fish and seafood. Egg whites. Dried beans, peas, or lentils. Unsalted nuts, nut butters, and seeds. Unsalted canned beans. Lean cuts of beef with fat trimmed off. Low-sodium, lean deli meat. Dairy Low-fat (1%) or fat-free (skim) milk. Fat-free, low-fat, or reduced-fat cheeses. Nonfat, low-sodium ricotta or cottage cheese. Low-fat or nonfat yogurt. Low-fat, low-sodium cheese. Fats and oils Soft margarine without trans fats. Vegetable oil. Low-fat, reduced-fat, or light mayonnaise and salad dressings (reduced-sodium). Canola, safflower, olive, soybean, and sunflower oils. Avocado. Seasoning and other foods Herbs. Spices. Seasoning mixes without salt. Unsalted popcorn and pretzels. Fat-free sweets. What foods are not recommended? The items listed may not be a complete list. Talk with your dietitian about what dietary choices are best for you. Grains Baked goods made with fat, such as croissants,  muffins, or some breads. Dry pasta or rice meal packs. Vegetables Creamed or fried vegetables. Vegetables in a cheese sauce. Regular canned vegetables (not low-sodium or reduced-sodium). Regular canned tomato sauce and paste (not low-sodium or reduced-sodium). Regular tomato and vegetable juice (not low-sodium or reduced-sodium). Angie Fava. Olives. Fruits Canned fruit in a light or heavy syrup. Fried fruit. Fruit in cream or butter sauce. Meat and other protein foods Fatty cuts of meat. Ribs. Fried meat. Berniece Salines. Sausage. Bologna and other processed lunch meats. Salami. Fatback. Hotdogs. Bratwurst. Salted nuts and seeds. Canned beans with added salt. Canned or smoked fish. Whole eggs or egg yolks. Chicken or [place] with skin. Dairy Whole or 2% milk, cream, and half-and-half. Whole or full-fat cream cheese. Whole-fat or sweetened yogurt. Full-fat cheese. Nondairy creamers. Whipped toppings. Processed cheese and cheese spreads. Fats and oils Butter. Stick margarine. Lard. Shortening. Ghee. Bacon fat. Tropical oils, such as coconut, palm kernel, or palm oil. Seasoning and other foods Salted popcorn and pretzels. Onion salt, garlic salt, seasoned salt, table salt, and sea salt. Worcestershire sauce. Tartar sauce. Barbecue sauce. Teriyaki sauce. Soy sauce, including reduced-sodium. Steak sauce. Canned and packaged gravies. Fish sauce. Oyster sauce. Cocktail sauce. Horseradish that you  find on the shelf. Ketchup. Mustard. Meat flavorings and tenderizers. Bouillon cubes. Hot sauce and Tabasco sauce. Premade or packaged marinades. Premade or packaged taco seasonings. Relishes. Regular salad dressings. Where to find more information:  National Heart, Lung, and [place]: https://wilson-eaton.com/  American Heart Association: www.heart.org Summary  The DASH eating plan is a healthy eating plan that has been shown to reduce high blood pressure (hypertension). It may also reduce your risk for type 2 diabetes,  heart disease, and stroke.  With the DASH eating plan, you should limit salt (sodium) intake to 2,300 mg a day. If you have hypertension, you may need to reduce your sodium intake to 1,500 mg a day.  When on the DASH eating plan, aim to eat more fresh fruits and vegetables, whole grains, lean proteins, low-fat dairy, and heart-healthy fats.  Work with your health care provider or diet and nutrition specialist (dietitian) to adjust your eating plan to your individual calorie needs. This information is not intended to replace advice given to you by your health care provider. Make sure you discuss any questions you have with your health care provider. Document Released: 11/16/2011 Document Revised: 11/20/2016 Document Reviewed: 11/20/2016 Elsevier Interactive Patient Education  [employer].

## 2018-04-11 ENCOUNTER — Encounter: Payer: Self-pay | Admitting: Family Medicine

## 2018-04-11 ENCOUNTER — Other Ambulatory Visit: Payer: Self-pay | Admitting: Nurse Practitioner

## 2018-04-11 DIAGNOSIS — E559 Vitamin D deficiency, unspecified: Secondary | ICD-10-CM

## 2018-04-11 LAB — VITAMIN D 25 HYDROXY (VIT D DEFICIENCY, FRACTURES): Vit D, 25-Hydroxy: 38 ng/mL (ref 30–100)

## 2018-04-11 MED ORDER — VITAMIN D (ERGOCALCIFEROL) 1.25 MG (50000 UNIT) PO CAPS: 50000.0000 [IU] | ORAL_CAPSULE | ORAL | 0 refills | Status: DC

## 2018-04-22 ENCOUNTER — Ambulatory Visit (INDEPENDENT_AMBULATORY_CARE_PROVIDER_SITE_OTHER): Payer: BLUE CROSS/BLUE SHIELD | Admitting: Podiatry

## 2018-04-22 ENCOUNTER — Encounter: Payer: Self-pay | Admitting: Podiatry

## 2018-04-22 VITALS — BP 184/110 | HR 87

## 2018-04-22 DIAGNOSIS — L97421 Non-pressure chronic ulcer of left heel and midfoot limited to breakdown of skin: Secondary | ICD-10-CM

## 2018-04-22 DIAGNOSIS — E11621 Type 2 diabetes mellitus with foot ulcer: Secondary | ICD-10-CM

## 2018-04-22 LAB — HM DIABETES FOOT EXAM: HM DIABETIC FOOT EXAM: ABNORMAL

## 2018-04-22 NOTE — Progress Notes (Signed)
This patient presents the office with chief complaint of an open wound on the outside of his left foot.  He says this open wound started as a callous and it has turned into an open wound with drainage.  He says that when he works. He stands and uses steel toed shoes at work.  He says he knew he had a callous, but felt that the callus would go away. He was seen by his doctor for this ulcer on May 1.  He presents to the office for evaluation and treatment of this diabetic ulcer.   General Appearance  Alert, conversant and in no acute stress.  Vascular  Dorsalis pedis and posterior tibial  pulses are palpable  bilaterally.  Capillary return is within normal limits  bilaterally. Temperature is within normal limits  bilaterally.  Neurologic  Senn-Weinstein monofilament wire test within normal limits  bilaterally. Muscle power within normal limits bilaterally.  Nails normal nails noted with no evidence of bacterial or fungal infection  Orthopedic  No limitations of motion of motion feet .  No crepitus or effusions noted.  Bony prominence noted at the lateral aspect of the fifth metatarsal base.  Skin/ulcer  Examination of the diabetic ulcer on the lateral aspect of the fifth meta-base reveals a diabetic ulcer measuring 15 mm x 12 mm.  No evidence of any redness, swelling, pus or infection.  Significant necrotic tissue noted and surrounding the center ulcer.    Diabetic ulcer left foot  IE  Debridement necrotic tissue on the diabetic ulcer left foot, revealing an ulcer measuring 12 mm x 15 mm.  After discussion of the size of the diabetic ulcer. We decided to treat him with Betadine and dry sterile dressing and an Haematologist.  He is ambulating with a surgical shoe and return to the office in one week.  If this condition worsens or becomes very painful, the patient was told to contact this office or go to the Emergency Department at the hospital.  Gardiner Barefoot DPM

## 2018-04-29 ENCOUNTER — Encounter: Payer: Self-pay | Admitting: Podiatry

## 2018-04-29 ENCOUNTER — Ambulatory Visit (INDEPENDENT_AMBULATORY_CARE_PROVIDER_SITE_OTHER): Payer: BLUE CROSS/BLUE SHIELD | Admitting: Podiatry

## 2018-04-29 DIAGNOSIS — E11621 Type 2 diabetes mellitus with foot ulcer: Secondary | ICD-10-CM | POA: Diagnosis not present

## 2018-04-29 DIAGNOSIS — L97421 Non-pressure chronic ulcer of left heel and midfoot limited to breakdown of skin: Secondary | ICD-10-CM | POA: Diagnosis not present

## 2018-04-29 NOTE — Progress Notes (Signed)
This patient presents to the office for continued evaluation and treatment of a diabetic ulcer left foot.  Patient was evaluated 1 week ago and treated with a Betadine and de dressing and an Haematologist.  He says he felt occasional stinging at the site of the ulcer, but has been walking  with the Unna boot and surgical shoe since last visit.  He returns to the office today for removal of the Unna boot an evaluation of his  diabetic ulcer.  General Appearance  Alert, conversant and in no acute stress.  Vascular  Dorsalis pedis and posterior tibial  pulses are palpable  bilaterally.  Capillary return is within normal limits  bilaterally. Temperature is within normal limits  bilaterally.  Neurologic  Senn-Weinstein monofilament wire test within normal limits  bilaterally. Muscle power within normal limits bilaterally.  Nails Normal nails noted with no evidence of fungal or bacterial infection.  Orthopedic  No limitations of motion of motion feet .  No crepitus or effusions noted.  No bony pathology or digital deformities noted.  Skin  normotropic skin with no porokeratosis noted bilaterally.  No signs of infections .  The ulcer, sub-fifth meta-base left foot measures 10 mm x 10 mm.  The ulcer is surrounded by callus/hyperkeratotic skin.  No evidence of any drainage or infection noted.  Normal granulation tissue is noted in the center of the ulcer.    Diabetic ulcer left foot.  ROV. Debridement of necrotic tissue surrounding the ulcer was performed.  There is improvement at the ulcer site and the ulcer has gotten  Smaller.  Patient was told to soak his foot at home in soapy soaks in water and bandage his foot.  Padding  was placed on the  powerstep insole  in an effort to take pressure off the ulcer site.  He is to return to the office in 2 weeks.   Gardiner Barefoot DPM

## 2018-05-13 ENCOUNTER — Ambulatory Visit: Payer: BLUE CROSS/BLUE SHIELD | Admitting: Podiatry

## 2018-05-30 ENCOUNTER — Other Ambulatory Visit: Payer: Self-pay | Admitting: Nurse Practitioner

## 2018-05-30 DIAGNOSIS — I10 Essential (primary) hypertension: Secondary | ICD-10-CM

## 2018-06-26 ENCOUNTER — Ambulatory Visit: Payer: BLUE CROSS/BLUE SHIELD | Admitting: Family Medicine

## 2018-06-28 ENCOUNTER — Other Ambulatory Visit: Payer: Self-pay | Admitting: Nurse Practitioner

## 2018-06-28 DIAGNOSIS — I10 Essential (primary) hypertension: Secondary | ICD-10-CM

## 2018-07-01 NOTE — Telephone Encounter (Signed)
You saw this patient last.

## 2018-07-02 ENCOUNTER — Telehealth: Payer: Self-pay | Admitting: Nurse Practitioner

## 2018-07-02 NOTE — Telephone Encounter (Signed)
Called patient and his wife will have in call back to see if he has enough medication and to schedule an appointment for follow up on htn.

## 2018-07-02 NOTE — Telephone Encounter (Signed)
Please call patient; received refill for blood pressure medication- last visit blood pressure was elevated would like to ensure he does not need further adjustment of medications. Can he make an appointment before he runs out of his blood pressure medications if not I can refill small amount to last till appointment time.

## 2018-07-02 NOTE — Telephone Encounter (Addendum)
duplicate

## 2018-07-03 ENCOUNTER — Ambulatory Visit (INDEPENDENT_AMBULATORY_CARE_PROVIDER_SITE_OTHER): Payer: BLUE CROSS/BLUE SHIELD | Admitting: Family Medicine

## 2018-07-03 ENCOUNTER — Encounter: Payer: Self-pay | Admitting: Family Medicine

## 2018-07-03 VITALS — BP 164/90 | HR 84 | Temp 97.8°F | Resp 16 | Ht 71.0 in | Wt 211.9 lb

## 2018-07-03 DIAGNOSIS — E559 Vitamin D deficiency, unspecified: Secondary | ICD-10-CM

## 2018-07-03 DIAGNOSIS — E1165 Type 2 diabetes mellitus with hyperglycemia: Secondary | ICD-10-CM

## 2018-07-03 DIAGNOSIS — Z23 Encounter for immunization: Secondary | ICD-10-CM

## 2018-07-03 DIAGNOSIS — E785 Hyperlipidemia, unspecified: Secondary | ICD-10-CM

## 2018-07-03 DIAGNOSIS — E119 Type 2 diabetes mellitus without complications: Secondary | ICD-10-CM | POA: Insufficient documentation

## 2018-07-03 DIAGNOSIS — I10 Essential (primary) hypertension: Secondary | ICD-10-CM

## 2018-07-03 DIAGNOSIS — E1169 Type 2 diabetes mellitus with other specified complication: Secondary | ICD-10-CM | POA: Diagnosis not present

## 2018-07-03 DIAGNOSIS — L97529 Non-pressure chronic ulcer of other part of left foot with unspecified severity: Secondary | ICD-10-CM

## 2018-07-03 DIAGNOSIS — E11621 Type 2 diabetes mellitus with foot ulcer: Secondary | ICD-10-CM | POA: Diagnosis not present

## 2018-07-03 MED ORDER — AMLODIPINE BESYLATE 10 MG PO TABS: 10.0000 mg | ORAL_TABLET | Freq: Every day | ORAL | 0 refills | Status: DC

## 2018-07-03 MED ORDER — EMPAGLIFLOZIN-METFORMIN HCL ER 12.5-1000 MG PO TB24: 2.0000 | ORAL_TABLET | Freq: Every day | ORAL | 5 refills | Status: DC

## 2018-07-03 MED ORDER — VALSARTAN-HYDROCHLOROTHIAZIDE 320-25 MG PO TABS: 1.0000 | ORAL_TABLET | Freq: Every day | ORAL | 1 refills | Status: DC

## 2018-07-03 MED ORDER — ROSUVASTATIN CALCIUM 40 MG PO TABS: 40.0000 mg | ORAL_TABLET | Freq: Every day | ORAL | 1 refills | Status: DC

## 2018-07-03 NOTE — Telephone Encounter (Signed)
Not sure why this came back. I have contacted patient twice. Wife was suppose to have him call back to verify if he had enough medication and to schedule an htn follow up to adjust his medications. I have not heard from him and I reached out to him again today. Please advise.

## 2018-07-03 NOTE — Progress Notes (Signed)
Name: [name]   MRN: [id number]    DOB: 28-Feb-1966   Date:07/03/2018       Progress Note  Subjective  Chief Complaint  Chief Complaint  Patient presents with  . Follow-up    States without Lisinopril his left foot will swell.  . Hypertension    Elevated BP states he has been without his Lisinopril-HCTZ for 3 days and feels as the Amlodipine does nothing.     HPI  DMII: he states he has been compliant of medication but does not check his glucose at home. He states he is not compliant with his diet. He has been eating at fast food restaurants three - four times a week. Usually burgers and french fries. He has polyphagia, episodes of polydipsia and has nocturia. He is due for an eye exam. He will have PCV 23 today. He has foot ulcer and lost to follow up with podiatrist. Explained importance of compliance with therapy . He also has ED but states Revatio did not work   HTN: he has been out of lisinopril hctz. BP is a little better but still not at goal, we will change to valsartan hctz from lisinopril . He denies chest pain or palpitation   Dyslipidemia: he is on atorvastatin but LDL was not at goal, we will change to crestor 40 mg and recheck labs next visit   Patient Active Problem List   Diagnosis Date Noted  . Dyslipidemia associated with type 2 diabetes mellitus ([place]) 07/03/2018  . Vitamin D deficiency 04/11/2018  . ED (erectile dysfunction) 04/10/2018  . Lower urinary tract symptoms (LUTS) 02/09/2017  . Hypertension 07/05/2015  . Diabetes mellitus type 2, uncontrolled ([place]) 07/05/2015  . HLD (hyperlipidemia) 07/05/2015    No past surgical history on file.  Family History  Problem Relation Age of Onset  . Heart disease Father   . Heart attack Father   . Healthy Mother   . Prostate cancer Neg Hx   . Kidney cancer Neg Hx   . Bladder Cancer Neg Hx     Social History   Socioeconomic History  . Marital status: Married    Spouse name: Not on file  . Number of  children: Not on file  . Years of education: Not on file  . Highest education level: Not on file  Occupational History  . Not on file  Social Needs  . Financial resource strain: Not on file  . Food insecurity:    Worry: Not on file    Inability: Not on file  . Transportation needs:    Medical: Not on file    Non-medical: Not on file  Tobacco Use  . Smoking status: Never Smoker  . Smokeless tobacco: Never Used  Substance and Sexual Activity  . Alcohol use: Yes    Alcohol/week: 0.0 oz    Comment: Occasional  . Drug use: No  . Sexual activity: Yes  Lifestyle  . Physical activity:    Days per week: Not on file    Minutes per session: Not on file  . Stress: Not on file  Relationships  . Social connections:    Talks on phone: Not on file    Gets together: Not on file    Attends religious service: Not on file    Active member of club or organization: Not on file    Attends meetings of clubs or organizations: Not on file    Relationship status: Not on file  . Intimate partner violence:  Fear of current or ex partner: Not on file    Emotionally abused: Not on file    Physically abused: Not on file    Forced sexual activity: Not on file  Other Topics Concern  . Not on file  Social History Narrative  . Not on file     Current Outpatient Medications:  .  amLODipine (NORVASC) 10 MG tablet, Take 1 tablet (10 mg total) by mouth daily., Disp: 90 tablet, Rfl: 0 .  aspirin 81 MG tablet, Take by mouth., Disp: , Rfl:  .  glucose blood (BAYER CONTOUR NEXT TEST) test strip, BAYER CONTOUR NEXT TEST (In Vitro Strip)  1 (one) Strip Strip three times daily for 90 days  Quantity: 1;  Refills: 3   Ordered :03-Feb-2013  [name] ;  Started 03-Feb-2013 Active, Disp: , Rfl:  .  sildenafil (REVATIO) 20 MG tablet, Take 3 to 5 tablets two hours before intercouse on an empty stomach.  Do not take with nitrates., Disp: 50 tablet, Rfl: 3 .  Vitamin D, Ergocalciferol, (DRISDOL) 50000 units CAPS  capsule, Take 1 capsule (50,000 Units total) by mouth every 7 (seven) days. For 12 weeks, Disp: 12 capsule, Rfl: 0 .  Empagliflozin-metFORMIN HCl ER (SYNJARDY XR) 12.04-999 MG TB24, Take 2 tablets by mouth daily., Disp: 60 tablet, Rfl: 5 .  rosuvastatin (CRESTOR) 40 MG tablet, Take 1 tablet (40 mg total) by mouth daily. In place of atorvastatin, Disp: 90 tablet, Rfl: 1 .  valsartan-hydrochlorothiazide (DIOVAN-HCT) 320-25 MG tablet, Take 1 tablet by mouth daily. In place of lisinopril hctz, Disp: 90 tablet, Rfl: 1  No Known Allergies   ROS  Constitutional: Negative for fever or weight change.  Respiratory: Negative for cough and shortness of breath.   Cardiovascular: Negative for chest pain or palpitations.  Gastrointestinal: Negative for abdominal pain, no bowel changes.  Musculoskeletal: Negative for gait problem or joint swelling.  Skin: Negative for rash.  Neurological: Negative for dizziness or headache.  No other specific complaints in a complete review of systems (except as listed in HPI above).  Objective  Vitals:   07/03/18 1059  BP: (!) 164/90  Pulse: 84  Resp: 16  Temp: 97.8 F (36.6 C)  TempSrc: Oral  SpO2: 96%  Weight: 211 lb 14.4 oz (96.1 kg)  Height: 5\' 11"  (1.803 m)    Body mass index is 29.55 kg/m.  Physical Exam  Constitutional: Patient appears well-developed and well-nourished. Overweight.  No distress.  HEENT: head atraumatic, normocephalic, pupils equal and reactive to light,  neck supple, throat within normal limits Cardiovascular: Normal rate, regular rhythm and normal heart sounds.  No murmur heard. No BLE edema. Pulmonary/Chest: Effort normal and breath sounds normal. No respiratory distress. Abdominal: Soft.  There is no tenderness. Psychiatric: Patient has a normal mood and affect. behavior is normal. Judgment and thought content normal.  Recent Results (from the past 2160 hour(s))  POCT HgB A1C     Status: None   Collection Time: 04/10/18  10:17 AM  Result Value Ref Range   Hemoglobin A1C 9.1   Vitamin D (25 hydroxy)     Status: None   Collection Time: 04/10/18 10:57 AM  Result Value Ref Range   Vit D, 25-Hydroxy 38 30 - 100 ng/mL    Comment: Vitamin D Status         25-OH Vitamin D: . Deficiency:                    <20 ng/mL  Insufficiency:             20 - 29 ng/mL Optimal:                 > or = 30 ng/mL . For 25-OH Vitamin D testing on patients on  D2-supplementation and patients for whom quantitation  of D2 and D3 fractions is required, the QuestAssureD(TM) 25-OH VIT D, (D2,D3), LC/MS/MS is recommended: order  code [phone] (patients >62yrs). . For more information on this test, go to: http://education.questdiagnostics.com/faq/FAQ163 (This link is being provided for  informational/educational purposes only.)   HM DIABETES FOOT EXAM     Status: None   Collection Time: 04/22/18 12:00 AM  Result Value Ref Range   HM Diabetic Foot Exam Abnormal-Ulcer on Foot     Comment: Dr. Gardiner Barefoot     Diabetic Foot Exam: Diabetic Foot Exam - Simple   Simple Foot Form Diabetic Foot exam was performed with the following findings:  Yes 07/03/2018 11:48 AM  Visual Inspection See comments:  Yes Sensation Testing Intact to touch and monofilament testing bilaterally:  Yes Pulse Check Posterior Tibialis and Dorsalis pulse intact bilaterally:  Yes Comments Ulceration of  lateral column of left foot       PHQ2/9: Depression screen [facility] 2/9 07/03/2018 01/16/2018 12/26/2017 08/01/2017 06/04/2017  Decreased Interest 0 0 0 0 0  Down, Depressed, Hopeless 0 0 0 0 0  PHQ - 2 Score 0 0 0 0 0     Fall Risk: Fall Risk  07/03/2018 01/16/2018 12/26/2017 08/01/2017 06/04/2017  Falls in the past year? No No No No No    Functional Status Survey: Is the patient deaf or have difficulty hearing?: No Does the patient have difficulty seeing, even when wearing glasses/contacts?: No Does the patient have difficulty concentrating, remembering, or  making decisions?: No Does the patient have difficulty walking or climbing stairs?: No Does the patient have difficulty dressing or bathing?: No Does the patient have difficulty doing errands alone such as visiting a doctor's office or shopping?: No    Assessment & Plan  1. Dyslipidemia associated with type 2 diabetes mellitus (HCC)  - Empagliflozin-metFORMIN HCl ER (SYNJARDY XR) 12.04-999 MG TB24; Take 2 tablets by mouth daily.  Dispense: 60 tablet; Refill: 5 - rosuvastatin (CRESTOR) 40 MG tablet; Take 1 tablet (40 mg total) by mouth daily. In place of atorvastatin  Dispense: 90 tablet; Refill: 1  2. Uncontrolled type 2 diabetes mellitus with hyperglycemia (HCC)  - Empagliflozin-metFORMIN HCl ER (SYNJARDY XR) 12.04-999 MG TB24; Take 2 tablets by mouth daily.  Dispense: 60 tablet; Refill: 5  3. Type 2 diabetes mellitus with left diabetic foot ulcer ([place])  Lost to follow up with podiatrist   4. Vitamin D deficiency   5. Uncontrolled hypertension  - valsartan-hydrochlorothiazide (DIOVAN-HCT) 320-25 MG tablet; Take 1 tablet by mouth daily. In place of lisinopril hctz  Dispense: 90 tablet; Refill: 1 - amLODipine (NORVASC) 10 MG tablet; Take 1 tablet (10 mg total) by mouth daily.  Dispense: 90 tablet; Refill: 0  6. Need for Tdap vaccination  - Tdap vaccine greater than or equal to 7yo IM  7. Need for vaccination for pneumococcus  - Pneumococcal polysaccharide vaccine 23-valent greater than or equal to 2yo subcutaneous/IM

## 2018-07-04 LAB — MICROALBUMIN / CREATININE URINE RATIO
CREATININE, URINE: 60 mg/dL (ref 20–320)
MICROALB UR: 57.8 mg/dL
MICROALB/CREAT RATIO: 963 ug/mg{creat} — AB (ref <30)

## 2018-07-30 ENCOUNTER — Other Ambulatory Visit: Payer: Self-pay | Admitting: Family Medicine

## 2018-07-30 ENCOUNTER — Telehealth: Payer: Self-pay

## 2018-07-30 NOTE — Telephone Encounter (Signed)
He was getting Revation from [employer] [place].

## 2018-07-30 NOTE — Telephone Encounter (Signed)
We no longer have samples

## 2018-07-30 NOTE — Telephone Encounter (Signed)
Gave patient the information to call Zara Council at [facility] regarding refill of Sildenafil.  P: [phone]

## 2018-07-30 NOTE — Telephone Encounter (Signed)
Patient states he was taking Levitra in the past and getting it from [employer]. Informed them we no longer have samples but wanted to see about having this medication filled.

## 2018-08-02 ENCOUNTER — Other Ambulatory Visit: Payer: Self-pay | Admitting: Nurse Practitioner

## 2018-08-02 ENCOUNTER — Ambulatory Visit: Payer: BLUE CROSS/BLUE SHIELD

## 2018-08-02 DIAGNOSIS — E1165 Type 2 diabetes mellitus with hyperglycemia: Secondary | ICD-10-CM

## 2018-09-04 ENCOUNTER — Ambulatory Visit (INDEPENDENT_AMBULATORY_CARE_PROVIDER_SITE_OTHER): Payer: BLUE CROSS/BLUE SHIELD | Admitting: Family Medicine

## 2018-09-04 ENCOUNTER — Encounter: Payer: Self-pay | Admitting: Family Medicine

## 2018-09-04 VITALS — BP 156/90 | HR 75 | Temp 97.5°F | Resp 16 | Ht 71.0 in | Wt 215.0 lb

## 2018-09-04 DIAGNOSIS — Z23 Encounter for immunization: Secondary | ICD-10-CM

## 2018-09-04 DIAGNOSIS — E559 Vitamin D deficiency, unspecified: Secondary | ICD-10-CM | POA: Diagnosis not present

## 2018-09-04 DIAGNOSIS — E785 Hyperlipidemia, unspecified: Secondary | ICD-10-CM

## 2018-09-04 DIAGNOSIS — E1169 Type 2 diabetes mellitus with other specified complication: Secondary | ICD-10-CM

## 2018-09-04 DIAGNOSIS — I10 Essential (primary) hypertension: Secondary | ICD-10-CM

## 2018-09-04 DIAGNOSIS — E1121 Type 2 diabetes mellitus with diabetic nephropathy: Secondary | ICD-10-CM | POA: Diagnosis not present

## 2018-09-04 DIAGNOSIS — E1165 Type 2 diabetes mellitus with hyperglycemia: Secondary | ICD-10-CM

## 2018-09-04 LAB — POCT GLYCOSYLATED HEMOGLOBIN (HGB A1C): HBA1C, POC (CONTROLLED DIABETIC RANGE): 8.7 % — AB (ref 0.0–7.0)

## 2018-09-04 MED ORDER — GLIPIZIDE ER 5 MG PO TB24: 5.0000 mg | ORAL_TABLET | Freq: Every day | ORAL | 0 refills | Status: DC

## 2018-09-04 MED ORDER — METFORMIN HCL 1000 MG PO TABS: 1000.0000 mg | ORAL_TABLET | Freq: Two times a day (BID) | ORAL | 0 refills | Status: DC

## 2018-09-04 NOTE — Progress Notes (Signed)
Name: [name]   MRN: [id number]    DOB: 1966/07/17   Date:09/04/2018       Progress Note  Subjective  Chief Complaint  Chief Complaint  Patient presents with  . Medication Refill    Confused about medication he is suppose to be taking  . Diabetes    Never switched to Synjardy-still taking Jardiance  . Hypertension    Denies any symptoms  . Dyslipidemia    HPI  DMII: he states he has been compliant of medication but does not check his glucose at home. He was seen last visit and we changed rx from Metformin and Jardiance to [facility] and he has been taking both rx since last visit. Total of 4 g of metformin and 50 mg of Jardiance. That is a very high dose. He states does not like Synjardi because of size of pill. We separated bottles explained again same medications and he states he understood, he states wife gives him his medication. HgbA1C has improved. We will add glipizide, he refuses injectables. Explained the importance of not taking it while fasting because of risk of hypoglycemia. He had urine protein over 300 and we will refer her him nephrologist for further evaluation.   HTN: He is on Norvasc and valsartan hctz and bp is still high, he has macroalbuminuria. We will refer him to nephrologist   Dyslipidemia: he is on atorvastatin but LDL was not at goal, we will change to crestor 40 mg and recheck labs next visit   Patient Active Problem List   Diagnosis Date Noted  . Dyslipidemia associated with type 2 diabetes mellitus ([place]) 07/03/2018  . Vitamin D deficiency 04/11/2018  . ED (erectile dysfunction) 04/10/2018  . Lower urinary tract symptoms (LUTS) 02/09/2017  . Hypertension 07/05/2015  . Diabetes mellitus type 2, uncontrolled ([place]) 07/05/2015  . HLD (hyperlipidemia) 07/05/2015    No past surgical history on file.  Family History  Problem Relation Age of Onset  . Heart disease Father   . Heart attack Father   . Healthy Mother   . Prostate cancer Neg Hx   .  Kidney cancer Neg Hx   . Bladder Cancer Neg Hx     Social History   Socioeconomic History  . Marital status: Married    Spouse name: Not on file  . Number of children: Not on file  . Years of education: Not on file  . Highest education level: Not on file  Occupational History  . Not on file  Social Needs  . Financial resource strain: Not on file  . Food insecurity:    Worry: Not on file    Inability: Not on file  . Transportation needs:    Medical: Not on file    Non-medical: Not on file  Tobacco Use  . Smoking status: Never Smoker  . Smokeless tobacco: Never Used  Substance and Sexual Activity  . Alcohol use: Yes    Alcohol/week: 0.0 standard drinks    Comment: Occasional  . Drug use: No  . Sexual activity: Yes  Lifestyle  . Physical activity:    Days per week: Not on file    Minutes per session: Not on file  . Stress: Not on file  Relationships  . Social connections:    Talks on phone: Not on file    Gets together: Not on file    Attends religious service: Not on file    Active member of club or organization: Not on file  Attends meetings of clubs or organizations: Not on file    Relationship status: Not on file  . Intimate partner violence:    Fear of current or ex partner: Not on file    Emotionally abused: Not on file    Physically abused: Not on file    Forced sexual activity: Not on file  Other Topics Concern  . Not on file  Social History Narrative  . Not on file     Current Outpatient Medications:  .  amLODipine (NORVASC) 10 MG tablet, Take 1 tablet (10 mg total) by mouth daily., Disp: 90 tablet, Rfl: 0 .  aspirin 81 MG tablet, Take by mouth., Disp: , Rfl:  .  glucose blood (BAYER CONTOUR NEXT TEST) test strip, BAYER CONTOUR NEXT TEST (In Vitro Strip)  1 (one) Strip Strip three times daily for 90 days  Quantity: 1;  Refills: 3   Ordered :03-Feb-2013  [name] ;  Started 03-Feb-2013 Active, Disp: , Rfl:  .  rosuvastatin (CRESTOR) 40 MG tablet,  Take 1 tablet (40 mg total) by mouth daily. In place of atorvastatin, Disp: 90 tablet, Rfl: 1 .  sildenafil (REVATIO) 20 MG tablet, Take 3 to 5 tablets two hours before intercouse on an empty stomach.  Do not take with nitrates., Disp: 50 tablet, Rfl: 3 .  valsartan-hydrochlorothiazide (DIOVAN-HCT) 320-25 MG tablet, Take 1 tablet by mouth daily. In place of lisinopril hctz, Disp: 90 tablet, Rfl: 1 .  Vitamin D, Ergocalciferol, (DRISDOL) 50000 units CAPS capsule, Take 1 capsule (50,000 Units total) by mouth every 7 (seven) days. For 12 weeks, Disp: 12 capsule, Rfl: 0 .  glipiZIDE (GLUCOTROL XL) 5 MG 24 hr tablet, Take 1 tablet (5 mg total) by mouth daily with breakfast., Disp: 90 tablet, Rfl: 0 .  metFORMIN (GLUCOPHAGE) 1000 MG tablet, Take 1 tablet (1,000 mg total) by mouth 2 (two) times daily with a meal., Disp: 180 tablet, Rfl: 0  No Known Allergies  I personally reviewed active problem list, medication list, allergies, family history, social history with the patient/caregiver today.   ROS  Constitutional: Negative for fever or weight change.  Respiratory: Negative for cough and shortness of breath.   Cardiovascular: Negative for chest pain or palpitations.  Gastrointestinal: Negative for abdominal pain, no bowel changes.  Musculoskeletal: Negative for gait problem or joint swelling.  Skin: Negative for rash.  Neurological: Negative for dizziness or headache.  No other specific complaints in a complete review of systems (except as listed in HPI above).  Objective  Vitals:   09/04/18 0953  BP: (!) 156/90  Pulse: 75  Resp: 16  Temp: (!) 97.5 F (36.4 C)  TempSrc: Oral  SpO2: 99%  Weight: 215 lb (97.5 kg)  Height: 5\' 11"  (1.803 m)    Body mass index is 29.99 kg/m.  Physical Exam  Constitutional: Patient appears well-developed and well-nourished. Overweight. No distress.  HEENT: head atraumatic, normocephalic, pupils equal and reactive to light,  neck supple, throat within  normal limits Cardiovascular: Normal rate, regular rhythm and normal heart sounds.  No murmur heard. No BLE edema. Pulmonary/Chest: Effort normal and breath sounds normal. No respiratory distress. Abdominal: Soft.  There is no tenderness. Psychiatric: Patient has a normal mood and affect. behavior is normal. Judgment and thought content normal.  Recent Results (from the past 2160 hour(s))  Urine Microalbumin w/creat. ratio     Status: Abnormal   Collection Time: 07/03/18 12:15 PM  Result Value Ref Range   Creatinine, Urine 60 20 -  320 mg/dL   Microalb, Ur 57.8 mg/dL    Comment: Verified by repeat analysis. Marland Kitchen Reference Range Not established    Microalb Creat Ratio 963 (H) <30 mcg/mg creat    Comment: . The ADA defines abnormalities in albumin excretion as follows: Marland Kitchen Category         Result (mcg/mg creatinine) . Normal                    <30 Microalbuminuria         30-299  Clinical albuminuria   > OR = 300 . The ADA recommends that at least two of three specimens collected within a 3-6 month period be abnormal before considering a patient to be within a diagnostic category.   POCT HgB A1C     Status: Abnormal   Collection Time: 09/04/18 10:06 AM  Result Value Ref Range   Hemoglobin A1C     HbA1c POC (<> result, manual entry)     HbA1c, POC (prediabetic range)     HbA1c, POC (controlled diabetic range) 8.7 (A) 0.0 - 7.0 %     PHQ2/9: Depression screen [facility] 2/9 09/04/2018 07/03/2018 01/16/2018 12/26/2017 08/01/2017  Decreased Interest 0 0 0 0 0  Down, Depressed, Hopeless 0 0 0 0 0  PHQ - 2 Score 0 0 0 0 0     Fall Risk: Fall Risk  09/04/2018 07/03/2018 01/16/2018 12/26/2017 08/01/2017  Falls in the past year? No No No No No     Functional Status Survey: Is the patient deaf or have difficulty hearing?: No Does the patient have difficulty seeing, even when wearing glasses/contacts?: Yes Does the patient have difficulty concentrating, remembering, or making decisions?:  No Does the patient have difficulty walking or climbing stairs?: No Does the patient have difficulty dressing or bathing?: No Does the patient have difficulty doing errands alone such as visiting a doctor's office or shopping?: No    Assessment & Plan  1. Type 2 diabetes mellitus with hyperglycemia, without long-term current use of insulin (HCC)  - POCT HgB A1C - glipiZIDE (GLUCOTROL XL) 5 MG 24 hr tablet; Take 1 tablet (5 mg total) by mouth daily with breakfast.  Dispense: 90 tablet; Refill: 0  2. Type 2 diabetes mellitus with macroalbuminuric diabetic nephropathy (HCC)  - Ambulatory referral to Nephrology - CBC with Differential/Platelet - Parathyroid hormone, intact (no Ca) - Comprehensive metabolic panel  3. Vitamin D deficiency  - VITAMIN D 25 Hydroxy (Vit-D Deficiency, Fractures)  4. Uncontrolled hypertension  - CBC with Differential/Platelet  5. Dyslipidemia associated with type 2 diabetes mellitus (HCC)  - Lipid panel  6. Needs flu shot  - Flu Vaccine QUAD 6+ mos PF IM (Fluarix Quad PF)

## 2018-09-25 ENCOUNTER — Other Ambulatory Visit: Payer: Self-pay | Admitting: Nurse Practitioner

## 2018-09-25 ENCOUNTER — Telehealth: Payer: Self-pay | Admitting: Family Medicine

## 2018-09-25 DIAGNOSIS — E1165 Type 2 diabetes mellitus with hyperglycemia: Secondary | ICD-10-CM

## 2018-09-25 NOTE — Telephone Encounter (Signed)
Copied from [place] [phone]. Topic: Quick Communication - Rx Refill/Question >> Sep 25, 2018  1:36 PM Reyne Dumas L wrote: Medication:  Jardiance  Has the patient contacted their pharmacy? Yes - states pharmacy hasn't gotten RX yet (Agent: If no, request that the patient contact the pharmacy for the refill.) (Agent: If yes, when and what did the pharmacy advise?)  Preferred Pharmacy (with phone number or street name): [facility] (N), [place] - [place] [phone] (Phone) [phone] (Fax)  Agent: Please be advised that RX refills may take up to 3 business days. We ask that you follow-up with your pharmacy.

## 2018-09-26 ENCOUNTER — Other Ambulatory Visit: Payer: Self-pay

## 2018-09-26 DIAGNOSIS — IMO0001 Reserved for inherently not codable concepts without codable children: Secondary | ICD-10-CM

## 2018-09-26 DIAGNOSIS — E1165 Type 2 diabetes mellitus with hyperglycemia: Principal | ICD-10-CM

## 2018-09-26 MED ORDER — EMPAGLIFLOZIN 25 MG PO TABS: 25.0000 mg | ORAL_TABLET | Freq: Every day | ORAL | 0 refills | Status: DC

## 2018-09-26 NOTE — Progress Notes (Unsigned)
Refill request for diabetic medication:   Jardiance 25 mg  Last office visit pertaining to diabetes: 09/04/2018   Lab Results  Component Value Date   HGBA1C 8.7 (A) 09/04/2018     Follow-ups on file. 12/18/2018

## 2018-09-26 NOTE — Telephone Encounter (Signed)
Medication was submitted today.

## 2018-10-09 DIAGNOSIS — N189 Chronic kidney disease, unspecified: Secondary | ICD-10-CM | POA: Diagnosis not present

## 2018-10-09 DIAGNOSIS — R809 Proteinuria, unspecified: Secondary | ICD-10-CM | POA: Diagnosis not present

## 2018-10-09 DIAGNOSIS — I129 Hypertensive chronic kidney disease with stage 1 through stage 4 chronic kidney disease, or unspecified chronic kidney disease: Secondary | ICD-10-CM | POA: Diagnosis not present

## 2018-10-09 DIAGNOSIS — E1122 Type 2 diabetes mellitus with diabetic chronic kidney disease: Secondary | ICD-10-CM | POA: Diagnosis not present

## 2018-10-15 ENCOUNTER — Other Ambulatory Visit: Payer: Self-pay | Admitting: Nephrology

## 2018-10-15 DIAGNOSIS — N189 Chronic kidney disease, unspecified: Secondary | ICD-10-CM

## 2018-10-23 ENCOUNTER — Ambulatory Visit: Payer: BLUE CROSS/BLUE SHIELD

## 2018-10-24 ENCOUNTER — Ambulatory Visit: Payer: BLUE CROSS/BLUE SHIELD

## 2018-10-24 ENCOUNTER — Other Ambulatory Visit: Payer: Self-pay | Admitting: Nurse Practitioner

## 2018-10-24 DIAGNOSIS — E559 Vitamin D deficiency, unspecified: Secondary | ICD-10-CM

## 2018-12-12 ENCOUNTER — Other Ambulatory Visit: Payer: Self-pay | Admitting: Family Medicine

## 2018-12-12 DIAGNOSIS — E1165 Type 2 diabetes mellitus with hyperglycemia: Secondary | ICD-10-CM

## 2018-12-12 NOTE — Telephone Encounter (Signed)
Please call patient - he has labs ordered and it would benefit him to have these done prior to his appointment with Dr. Ancil Boozer on 12/18/18.

## 2018-12-12 NOTE — Telephone Encounter (Signed)
Refill request for diabetic medication:   Glipizide 5 mg  Last office visit pertaining to diabetes: 09/04/2018   Lab Results  Component Value Date   HGBA1C 8.7 (A) 09/04/2018    Follow-ups on file. 12/18/2018

## 2018-12-18 ENCOUNTER — Encounter: Payer: Self-pay | Admitting: Family Medicine

## 2018-12-18 ENCOUNTER — Ambulatory Visit: Payer: BLUE CROSS/BLUE SHIELD | Admitting: Family Medicine

## 2018-12-18 VITALS — BP 160/100 | HR 75 | Temp 98.0°F | Resp 16 | Ht 71.0 in | Wt 221.2 lb

## 2018-12-18 DIAGNOSIS — I10 Essential (primary) hypertension: Secondary | ICD-10-CM

## 2018-12-18 DIAGNOSIS — L97502 Non-pressure chronic ulcer of other part of unspecified foot with fat layer exposed: Secondary | ICD-10-CM | POA: Insufficient documentation

## 2018-12-18 DIAGNOSIS — E559 Vitamin D deficiency, unspecified: Secondary | ICD-10-CM

## 2018-12-18 DIAGNOSIS — Z1159 Encounter for screening for other viral diseases: Secondary | ICD-10-CM

## 2018-12-18 DIAGNOSIS — E785 Hyperlipidemia, unspecified: Secondary | ICD-10-CM | POA: Diagnosis not present

## 2018-12-18 DIAGNOSIS — E119 Type 2 diabetes mellitus without complications: Secondary | ICD-10-CM | POA: Insufficient documentation

## 2018-12-18 DIAGNOSIS — E1122 Type 2 diabetes mellitus with diabetic chronic kidney disease: Secondary | ICD-10-CM | POA: Diagnosis not present

## 2018-12-18 DIAGNOSIS — Z5320 Procedure and treatment not carried out because of patient's decision for unspecified reasons: Secondary | ICD-10-CM

## 2018-12-18 DIAGNOSIS — L97529 Non-pressure chronic ulcer of other part of left foot with unspecified severity: Secondary | ICD-10-CM

## 2018-12-18 DIAGNOSIS — Z1211 Encounter for screening for malignant neoplasm of colon: Secondary | ICD-10-CM

## 2018-12-18 DIAGNOSIS — E11621 Type 2 diabetes mellitus with foot ulcer: Secondary | ICD-10-CM

## 2018-12-18 DIAGNOSIS — E669 Obesity, unspecified: Secondary | ICD-10-CM

## 2018-12-18 DIAGNOSIS — E118 Type 2 diabetes mellitus with unspecified complications: Secondary | ICD-10-CM

## 2018-12-18 DIAGNOSIS — IMO0001 Reserved for inherently not codable concepts without codable children: Secondary | ICD-10-CM | POA: Insufficient documentation

## 2018-12-18 DIAGNOSIS — E1169 Type 2 diabetes mellitus with other specified complication: Secondary | ICD-10-CM

## 2018-12-18 DIAGNOSIS — I129 Hypertensive chronic kidney disease with stage 1 through stage 4 chronic kidney disease, or unspecified chronic kidney disease: Secondary | ICD-10-CM

## 2018-12-18 DIAGNOSIS — E1121 Type 2 diabetes mellitus with diabetic nephropathy: Secondary | ICD-10-CM

## 2018-12-18 LAB — POCT GLYCOSYLATED HEMOGLOBIN (HGB A1C)
HBA1C, POC (CONTROLLED DIABETIC RANGE): 7.6 % — AB (ref 0.0–7.0)
HBA1C, POC (PREDIABETIC RANGE): 7.6 % — AB (ref 5.7–6.4)
HbA1c POC (<> result, manual entry): 7.6 % (ref 4.0–5.6)
Hemoglobin A1C: 7.6 % — AB (ref 4.0–5.6)

## 2018-12-18 MED ORDER — GLIPIZIDE ER 5 MG PO TB24: 5.0000 mg | ORAL_TABLET | Freq: Every day | ORAL | 0 refills | Status: DC

## 2018-12-18 MED ORDER — VITAMIN D (ERGOCALCIFEROL) 1.25 MG (50000 UNIT) PO CAPS: 50000.0000 [IU] | ORAL_CAPSULE | ORAL | 0 refills | Status: DC

## 2018-12-18 MED ORDER — EMPAGLIFLOZIN 25 MG PO TABS: 25.0000 mg | ORAL_TABLET | Freq: Every day | ORAL | 0 refills | Status: DC

## 2018-12-18 MED ORDER — ALPRAZOLAM 0.5 MG PO TABS: 0.5000 mg | ORAL_TABLET | Freq: Every day | ORAL | 0 refills | Status: DC | PRN

## 2018-12-18 MED ORDER — VALSARTAN-HYDROCHLOROTHIAZIDE 320-25 MG PO TABS: 1.0000 | ORAL_TABLET | Freq: Every day | ORAL | 1 refills | Status: DC

## 2018-12-18 MED ORDER — METFORMIN HCL 1000 MG PO TABS: 1000.0000 mg | ORAL_TABLET | Freq: Two times a day (BID) | ORAL | 1 refills | Status: DC

## 2018-12-18 NOTE — Progress Notes (Signed)
Name: [name]   MRN: [id number]    DOB: 11-Apr-1966   Date:12/18/2018       Progress Note  Subjective  Chief Complaint  Chief Complaint  Patient presents with  . Diabetes  . Hypertension  . Dyslipidemia  . URI    x 1 week    HPI  DMII: he states he has been compliant of medication but does not check his glucose at home. He is now taking Glipizide XL 5 mg, Metformin 1000 mg BID and Jardiance. HgbA1C has improved he is still eating fast food, but has been walking 5 days a week,  his weight has gone up.  Explained the importance of not taking it while fasting because of risk of hypoglycemia. He had urine protein over 300 and sent to nephrologist, He needs labs today . He also has large foot ulcer, on left lateral foot, advised to go back to podiatrist. Explained importance of weight loss.   HTN: He is on Norvasc and valsartan hctz and bp is still high, he has macroalbuminuria. Seen by [facility] and bp was 170's. He states he is always very nervous during doctor's visits. He did not get labs done either, but willing to have it done today. He denies headaches or chest pain.   Dyslipidemia: he is on Crestor, but only taking it a few times a week, denies myalgia, explained importance of compliance  URI: resolving, not bothering him now  Patient Active Problem List   Diagnosis Date Noted  . Dyslipidemia associated with type 2 diabetes mellitus ([place]) 07/03/2018  . Vitamin D deficiency 04/11/2018  . ED (erectile dysfunction) 04/10/2018  . Lower urinary tract symptoms (LUTS) 02/09/2017  . Hypertension 07/05/2015  . Diabetes mellitus type 2, uncontrolled ([place]) 07/05/2015  . HLD (hyperlipidemia) 07/05/2015    History reviewed. No pertinent surgical history.  Family History  Problem Relation Age of Onset  . Heart disease Father   . Heart attack Father   . Healthy Mother   . Prostate cancer Neg Hx   . Kidney cancer Neg Hx   . Bladder Cancer Neg Hx     Social History    Socioeconomic History  . Marital status: Married    Spouse name: Not on file  . Number of children: Not on file  . Years of education: Not on file  . Highest education level: Not on file  Occupational History  . Not on file  Social Needs  . Financial resource strain: Not on file  . Food insecurity:    Worry: Not on file    Inability: Not on file  . Transportation needs:    Medical: Not on file    Non-medical: Not on file  Tobacco Use  . Smoking status: Never Smoker  . Smokeless tobacco: Never Used  Substance and Sexual Activity  . Alcohol use: Yes    Alcohol/week: 0.0 standard drinks    Comment: Occasional  . Drug use: No  . Sexual activity: Yes  Lifestyle  . Physical activity:    Days per week: Not on file    Minutes per session: Not on file  . Stress: Not on file  Relationships  . Social connections:    Talks on phone: Not on file    Gets together: Not on file    Attends religious service: Not on file    Active member of club or organization: Not on file    Attends meetings of clubs or organizations: Not on file  Relationship status: Not on file  . Intimate partner violence:    Fear of current or ex partner: Not on file    Emotionally abused: Not on file    Physically abused: Not on file    Forced sexual activity: Not on file  Other Topics Concern  . Not on file  Social History Narrative  . Not on file     Current Outpatient Medications:  .  amLODipine (NORVASC) 10 MG tablet, Take 1 tablet (10 mg total) by mouth daily., Disp: 90 tablet, Rfl: 0 .  aspirin 81 MG tablet, Take by mouth., Disp: , Rfl:  .  empagliflozin (JARDIANCE) 25 MG TABS tablet, Take 25 mg by mouth daily., Disp: 90 tablet, Rfl: 0 .  glipiZIDE (GLUCOTROL XL) 5 MG 24 hr tablet, TAKE 1 TABLET BY MOUTH ONCE DAILY WITH BREAKFAST, Disp: 90 tablet, Rfl: 0 .  glucose blood (BAYER CONTOUR NEXT TEST) test strip, BAYER CONTOUR NEXT TEST (In Vitro Strip)  1 (one) Strip Strip three times daily for 90  days  Quantity: 1;  Refills: 3   Ordered :03-Feb-2013  [name] ;  Started 03-Feb-2013 Active, Disp: , Rfl:  .  metFORMIN (GLUCOPHAGE) 1000 MG tablet, Take 1 tablet (1,000 mg total) by mouth 2 (two) times daily with a meal., Disp: 180 tablet, Rfl: 0 .  rosuvastatin (CRESTOR) 40 MG tablet, Take 1 tablet (40 mg total) by mouth daily. In place of atorvastatin, Disp: 90 tablet, Rfl: 1 .  valsartan-hydrochlorothiazide (DIOVAN-HCT) 320-25 MG tablet, Take 1 tablet by mouth daily. In place of lisinopril hctz, Disp: 90 tablet, Rfl: 1 .  Vitamin D, Ergocalciferol, (DRISDOL) 50000 units CAPS capsule, Take 1 capsule (50,000 Units total) by mouth every 7 (seven) days. For 12 weeks (Patient not taking: Reported on 12/18/2018), Disp: 12 capsule, Rfl: 0  No Known Allergies  I personally reviewed active problem list, medication list, allergies, family history, social history with the patient/caregiver today.   ROS   Constitutional: Negative for fever , positive for weight change.  Respiratory: Negative for cough and shortness of breath.   Cardiovascular: Negative for chest pain or palpitations.  Gastrointestinal: Negative for abdominal pain, no bowel changes.  Musculoskeletal: Negative for gait problem or joint swelling.  Skin: Negative for rash.  Neurological: Negative for dizziness or headache.  No other specific complaints in a complete review of systems (except as listed in HPI above).  Objective  Vitals:   12/18/18 0948 12/18/18 1008  BP: (!) 180/100 (!) 168/100  Pulse: 75   Resp: 16   Temp: 98 F (36.7 C)   TempSrc: Oral   SpO2: 99%   Weight: 221 lb 3.2 oz (100.3 kg)   Height: 5\' 11"  (1.803 m)     Body mass index is 30.85 kg/m.  Physical Exam  Constitutional: Patient appears well-developed and well-nourished. Obese No distress.  HEENT: head atraumatic, normocephalic, pupils equal and reactive to light, neck supple, throat within normal limits Cardiovascular: Normal rate, regular  rhythm and normal heart sounds.  No murmur heard. No BLE edema. Pulmonary/Chest: Effort normal and breath sounds normal. No respiratory distress. Abdominal: Soft.  There is no tenderness. Psychiatric: Patient has a normal mood and affect. behavior is normal. Judgment and thought content normal.  Recent Results (from the past 2160 hour(s))  POCT HgB A1C     Status: Abnormal   Collection Time: 12/18/18  9:51 AM  Result Value Ref Range   Hemoglobin A1C 7.6 (A) 4.0 - 5.6 %  HbA1c POC (<> result, manual entry) 7.6 4.0 - 5.6 %   HbA1c, POC (prediabetic range) 7.6 (A) 5.7 - 6.4 %   HbA1c, POC (controlled diabetic range) 7.6 (A) 0.0 - 7.0 %      PHQ2/9: Depression screen [facility] 2/9 12/18/2018 09/04/2018 07/03/2018 01/16/2018 12/26/2017  Decreased Interest 0 0 0 0 0  Down, Depressed, Hopeless 0 0 0 0 0  PHQ - 2 Score 0 0 0 0 0     Fall Risk: Fall Risk  12/18/2018 09/04/2018 07/03/2018 01/16/2018 12/26/2017  Falls in the past year? 0 No No No No  Number falls in past yr: 0 - - - -  Injury with Fall? 0 - - - -     Assessment & Plan  1. Type 2 diabetes with complication (HCC)  - POCT HgB A1C - metFORMIN (GLUCOPHAGE) 1000 MG tablet; Take 1 tablet (1,000 mg total) by mouth 2 (two) times daily with a meal.  Dispense: 180 tablet; Refill: 1 - glipiZIDE (GLUCOTROL XL) 5 MG 24 hr tablet; Take 1 tablet (5 mg total) by mouth daily with breakfast.  Dispense: 90 tablet; Refill: 0 - empagliflozin (JARDIANCE) 25 MG TABS tablet; Take 25 mg by mouth daily.  Dispense: 90 tablet; Refill: 0  2. Colon cancer screening  He needs to send Cologuard back   3. Vitamin D deficiency  - Vitamin D, Ergocalciferol, (DRISDOL) 1.25 MG (50000 UT) CAPS capsule; Take 1 capsule (50,000 Units total) by mouth every 7 (seven) days. For 12 weeks  Dispense: 12 capsule; Refill: 0  4. Uncontrolled hypertension  - valsartan-hydrochlorothiazide (DIOVAN-HCT) 320-25 MG tablet; Take 1 tablet by mouth daily. In place of lisinopril hctz   Dispense: 90 tablet; Refill: 1  5. Type 2 diabetes mellitus with obesity ([place])   6. White coat syndrome with hypertension  - ALPRAZolam (XANAX) 0.5 MG tablet; Take 1 tablet (0.5 mg total) by mouth daily as needed for anxiety. 30 minutes before doctor's visits  Dispense: 5 tablet; Refill: 0  7. Type 2 diabetes mellitus with left diabetic foot ulcer ([place])  He missed follow up with podiatrist but states will re-schedule it now   8. Type 2 diabetes mellitus with macroalbuminuric diabetic nephropathy ([place])  Seen by [facility]   9. Type 2 diabetes mellitus with chronic kidney disease and hypertension ([place])  On medication, return in 2 weeks with CMA for bp recheck

## 2018-12-19 LAB — VITAMIN D 25 HYDROXY (VIT D DEFICIENCY, FRACTURES): VIT D 25 HYDROXY: 20 ng/mL — AB (ref 30–100)

## 2018-12-19 LAB — COMPLETE METABOLIC PANEL WITH GFR
AG RATIO: 1.3 (calc) (ref 1.0–2.5)
ALT: 17 U/L (ref 9–46)
AST: 15 U/L (ref 10–35)
Albumin: 3.9 g/dL (ref 3.6–5.1)
Alkaline phosphatase (APISO): 80 U/L (ref 40–115)
BILIRUBIN TOTAL: 1.2 mg/dL (ref 0.2–1.2)
BUN/Creatinine Ratio: 30 (calc) — ABNORMAL HIGH (ref 6–22)
BUN: 26 mg/dL — ABNORMAL HIGH (ref 7–25)
CHLORIDE: 101 mmol/L (ref 98–110)
CO2: 27 mmol/L (ref 20–32)
Calcium: 9.6 mg/dL (ref 8.6–10.3)
Creat: 0.86 mg/dL (ref 0.70–1.33)
GFR, Est African American: 116 mL/min/{1.73_m2} (ref >=60)
GFR, Est Non African American: 100 mL/min/{1.73_m2} (ref >=60)
Globulin: 3.1 g/dL (calc) (ref 1.9–3.7)
Glucose, Bld: 154 mg/dL — ABNORMAL HIGH (ref 65–99)
Potassium: 4.1 mmol/L (ref 3.5–5.3)
Sodium: 139 mmol/L (ref 135–146)
Total Protein: 7 g/dL (ref 6.1–8.1)

## 2018-12-19 LAB — CBC WITH DIFFERENTIAL/PLATELET
Absolute Monocytes: 781 cells/uL (ref 200–950)
Basophils Absolute: 43 cells/uL (ref 0–200)
Basophils Relative: 0.6 %
Eosinophils Absolute: 199 cells/uL (ref 15–500)
Eosinophils Relative: 2.8 %
HCT: 41 % (ref 38.5–50.0)
Hemoglobin: 14.1 g/dL (ref 13.2–17.1)
Lymphs Abs: 1243 cells/uL (ref 850–3900)
MCH: 31.3 pg (ref 27.0–33.0)
MCHC: 34.4 g/dL (ref 32.0–36.0)
MCV: 90.9 fL (ref 80.0–100.0)
MPV: 9.8 fL (ref 7.5–12.5)
Monocytes Relative: 11 %
Neutro Abs: 4835 cells/uL (ref 1500–7800)
Neutrophils Relative %: 68.1 %
Platelets: 268 10*3/uL (ref 140–400)
RBC: 4.51 10*6/uL (ref 4.20–5.80)
RDW: 11.4 % (ref 11.0–15.0)
Total Lymphocyte: 17.5 %
WBC: 7.1 10*3/uL (ref 3.8–10.8)

## 2018-12-19 LAB — LIPID PANEL
Cholesterol: 234 mg/dL — ABNORMAL HIGH (ref <200)
HDL: 67 mg/dL (ref >40)
LDL CHOLESTEROL (CALC): 144 mg/dL — AB
Non-HDL Cholesterol (Calc): 167 mg/dL (calc) — ABNORMAL HIGH (ref <130)
Total CHOL/HDL Ratio: 3.5 (calc) (ref <5.0)
Triglycerides: 109 mg/dL (ref <150)

## 2018-12-19 LAB — HEPATITIS C ANTIBODY
Hepatitis C Ab: NONREACTIVE
SIGNAL TO CUT-OFF: 0.07 (ref <1.00)

## 2018-12-19 LAB — HIV ANTIBODY (ROUTINE TESTING W REFLEX): HIV 1&2 Ab, 4th Generation: NONREACTIVE

## 2018-12-19 LAB — PARATHYROID HORMONE, INTACT (NO CA): PTH: 87 pg/mL — ABNORMAL HIGH (ref 14–64)

## 2019-01-15 ENCOUNTER — Ambulatory Visit: Payer: Self-pay | Admitting: *Deleted

## 2019-01-15 NOTE — Telephone Encounter (Signed)
Based on epocrates it should be okay, not sure why he is taking Berberine.

## 2019-01-15 NOTE — Telephone Encounter (Signed)
Message from Rutherford Nail, [place] sent at 01/15/2019 11:58 AM EST   Summary: supplements question   Patient calling and would like to know if he can take Omega 3 fish oil 500MG  and Berberine? Please advise.

## 2019-01-16 NOTE — Telephone Encounter (Signed)
Called patient. Wife will let him know it is ok to take fish oil  And Berberine.

## 2019-02-12 ENCOUNTER — Emergency Department
Admission: EM | Admit: 2019-02-12 | Discharge: 2019-02-12 | Disposition: A | Discharge status: Home / Self Care | Payer: BLUE CROSS/BLUE SHIELD | Attending: Emergency Medicine | Admitting: Emergency Medicine

## 2019-02-12 ENCOUNTER — Other Ambulatory Visit: Payer: Self-pay

## 2019-02-12 ENCOUNTER — Emergency Department: Payer: BLUE CROSS/BLUE SHIELD

## 2019-02-12 DIAGNOSIS — E1122 Type 2 diabetes mellitus with diabetic chronic kidney disease: Secondary | ICD-10-CM | POA: Insufficient documentation

## 2019-02-12 DIAGNOSIS — Z7982 Long term (current) use of aspirin: Secondary | ICD-10-CM | POA: Insufficient documentation

## 2019-02-12 DIAGNOSIS — N189 Chronic kidney disease, unspecified: Secondary | ICD-10-CM | POA: Diagnosis not present

## 2019-02-12 DIAGNOSIS — R079 Chest pain, unspecified: Secondary | ICD-10-CM

## 2019-02-12 DIAGNOSIS — Z79899 Other long term (current) drug therapy: Secondary | ICD-10-CM | POA: Diagnosis not present

## 2019-02-12 DIAGNOSIS — Z7984 Long term (current) use of oral hypoglycemic drugs: Secondary | ICD-10-CM | POA: Insufficient documentation

## 2019-02-12 DIAGNOSIS — I129 Hypertensive chronic kidney disease with stage 1 through stage 4 chronic kidney disease, or unspecified chronic kidney disease: Secondary | ICD-10-CM | POA: Diagnosis not present

## 2019-02-12 LAB — CBC WITH DIFFERENTIAL/PLATELET
Abs Immature Granulocytes: 0.02 10*3/uL (ref 0.00–0.07)
Basophils Absolute: 0.1 10*3/uL (ref 0.0–0.1)
Basophils Relative: 1 %
Eosinophils Absolute: 0.2 10*3/uL (ref 0.0–0.5)
Eosinophils Relative: 2 %
HCT: 43.9 % (ref 39.0–52.0)
Hemoglobin: 14.9 g/dL (ref 13.0–17.0)
Immature Granulocytes: 0 %
Lymphocytes Relative: 17 %
Lymphs Abs: 1.1 10*3/uL (ref 0.7–4.0)
MCH: 31 pg (ref 26.0–34.0)
MCHC: 33.9 g/dL (ref 30.0–36.0)
MCV: 91.3 fL (ref 80.0–100.0)
Monocytes Absolute: 0.5 10*3/uL (ref 0.1–1.0)
Monocytes Relative: 8 %
Neutro Abs: 4.7 10*3/uL (ref 1.7–7.7)
Neutrophils Relative %: 72 %
Platelets: 288 10*3/uL (ref 150–400)
RBC: 4.81 MIL/uL (ref 4.22–5.81)
RDW: 11.5 % (ref 11.5–15.5)
WBC: 6.5 10*3/uL (ref 4.0–10.5)
nRBC: 0 % (ref 0.0–0.2)

## 2019-02-12 LAB — COMPREHENSIVE METABOLIC PANEL
ALK PHOS: 73 U/L (ref 38–126)
ALT: 21 U/L (ref 0–44)
ANION GAP: 11 (ref 5–15)
AST: 20 U/L (ref 15–41)
Albumin: 4 g/dL (ref 3.5–5.0)
BUN: 25 mg/dL — ABNORMAL HIGH (ref 6–20)
CALCIUM: 9.7 mg/dL (ref 8.9–10.3)
CO2: 25 mmol/L (ref 22–32)
Chloride: 102 mmol/L (ref 98–111)
Creatinine, Ser: 0.78 mg/dL (ref 0.61–1.24)
GFR calc Af Amer: >60 mL/min (ref >60)
GFR calc non Af Amer: >60 mL/min (ref >60)
Glucose, Bld: 154 mg/dL — ABNORMAL HIGH (ref 70–99)
Potassium: 4 mmol/L (ref 3.5–5.1)
Sodium: 138 mmol/L (ref 135–145)
Total Bilirubin: 1.5 mg/dL — ABNORMAL HIGH (ref 0.3–1.2)
Total Protein: 7.8 g/dL (ref 6.5–8.1)

## 2019-02-12 LAB — TROPONIN I
Troponin I: <0.03 ng/mL (ref <0.03)
Troponin I: <0.03 ng/mL (ref <0.03)

## 2019-02-12 NOTE — ED Notes (Signed)
Resting at present  Explained the wait  No pain at present

## 2019-02-12 NOTE — ED Notes (Signed)
See triage note  Presents with intermittent chest discomfort  States pain is mainly under left rib area   States he does not have to be doing anything to produce pain and pain only last a short while

## 2019-02-12 NOTE — ED Triage Notes (Signed)
Pt in with co left rib pain states for few months, does do heavy lifting at work. Pinpoint area to left anterior lower rib pain, states "feels like its right under my rib".

## 2019-02-12 NOTE — ED Provider Notes (Signed)
[facility] Emergency Department Provider Note  ____________________________________________   First MD Initiated Contact with Patient 02/12/19 [phone]     (approximate)  I have reviewed the triage vital signs and the nursing notes.   HISTORY  Chief Complaint Chest Pain  HPI [name] is a 53 y.o. male presents to the ED with complaint of intermittent infrequent left-sided chest discomfort for the last 3 to 4 months.  Patient states that pain feels like "a tingle" that runs along his rib lasting seconds and unrelated to movement, breathing, or exertion.  Patient has not taken any over-the-counter medication for this.  He states that he does do a lot of lifting at work and is unaware of any injury.  He denies any shortness of breath, diaphoresis, dizziness, indigestion or nausea.  He states that this is not restrict him from his work.  He has not seen his PCP for this.  Patient does have a history of hypertension and diabetes.  He denies any previous cardiac issues.  Patient is a non-smoker.  Patient reports a "twinge" this morning but not since he has been in the ED.  He rates his pain as a 3 out of 10.      Past Medical History:  Diagnosis Date  . Diabetes mellitus without complication ([place])   . Hyperlipidemia   . Hypertension     Patient Active Problem List   Diagnosis Date Noted  . Type 2 diabetes mellitus with left diabetic foot ulcer ([place]) 12/18/2018  . Type 2 diabetes mellitus with chronic kidney disease and hypertension ([place]) 12/18/2018  . Type 2 diabetes mellitus with macroalbuminuric diabetic nephropathy ([place]) 12/18/2018  . Dyslipidemia associated with type 2 diabetes mellitus ([place]) 07/03/2018  . Vitamin D deficiency 04/11/2018  . ED (erectile dysfunction) 04/10/2018  . Lower urinary tract symptoms (LUTS) 02/09/2017  . Hypertension 07/05/2015  . Diabetes mellitus type 2, uncontrolled ([place]) 07/05/2015  . HLD (hyperlipidemia) 07/05/2015    No  past surgical history on file.  Prior to Admission medications   Medication Sig Start Date End Date Taking? Authorizing Provider  ALPRAZolam Duanne Moron) 0.5 MG tablet Take 1 tablet (0.5 mg total) by mouth daily as needed for anxiety. 30 minutes before doctor's visits 12/18/18   [name], MD  amLODipine (NORVASC) 10 MG tablet Take 1 tablet (10 mg total) by mouth daily. 07/03/18   [name], MD  aspirin 81 MG tablet Take by mouth.    [provider]  empagliflozin (JARDIANCE) 25 MG TABS tablet Take 25 mg by mouth daily. 12/18/18   [name], MD  glipiZIDE (GLUCOTROL XL) 5 MG 24 hr tablet Take 1 tablet (5 mg total) by mouth daily with breakfast. 12/18/18   Ancil Boozer, Drue Stager, MD  glucose blood (BAYER CONTOUR NEXT TEST) test strip BAYER CONTOUR NEXT TEST (In Vitro Strip)  1 (one) Strip Strip three times daily for 90 days  Quantity: 1;  Refills: 3   Ordered :03-Feb-2013  [name] ;  Started 03-Feb-2013 Active 02/03/13   [provider]  metFORMIN (GLUCOPHAGE) 1000 MG tablet Take 1 tablet (1,000 mg total) by mouth 2 (two) times daily with a meal. 12/18/18   [name], MD  rosuvastatin (CRESTOR) 40 MG tablet Take 1 tablet (40 mg total) by mouth daily. In place of atorvastatin 07/03/18   [name], MD  valsartan-hydrochlorothiazide (DIOVAN-HCT) 320-25 MG tablet Take 1 tablet by mouth daily. In place of lisinopril hctz 12/18/18   [name], MD  Vitamin  D, Ergocalciferol, (DRISDOL) 1.25 MG (50000 UT) CAPS capsule Take 1 capsule (50,000 Units total) by mouth every 7 (seven) days. For 12 weeks 12/18/18   [name], MD    Allergies Patient has no known allergies.  Family History  Problem Relation Age of Onset  . Heart disease Father   . Heart attack Father   . Healthy Mother   . Prostate cancer Neg Hx   . Kidney cancer Neg Hx   . Bladder Cancer Neg Hx     Social History Social History   Tobacco Use  . Smoking status: Never Smoker  . Smokeless  tobacco: Never Used  Substance Use Topics  . Alcohol use: Yes    Alcohol/week: 0.0 standard drinks    Comment: Occasional  . Drug use: No    Review of Systems Constitutional: No fever/chills Eyes: No visual changes. ENT: No sore throat. Cardiovascular: Denies shortness of breath, dizziness, diaphoresis.  Patient reports episodes of intermittent "twinge" like episodes lasting 2 to 3 seconds. Respiratory: Denies shortness of breath. Gastrointestinal: No abdominal pain.  No nausea, no vomiting.  Genitourinary: Negative for dysuria. Musculoskeletal: Questionable left-sided rib pain. Skin: Negative for rash. Neurological: Negative for headaches, focal weakness or numbness. ____________________________________________   PHYSICAL EXAM:  VITAL SIGNS: ED Triage Vitals  Enc Vitals Group     BP 02/12/19 0644 (!) 184/92     Pulse Rate 02/12/19 0644 74     Resp 02/12/19 0644 18     Temp 02/12/19 0644 98 F (36.7 C)     Temp Source 02/12/19 0644 Oral     SpO2 02/12/19 0644 100 %     Weight 02/12/19 0644 221 lb (100.2 kg)     Height 02/12/19 0644 5\' 10"  (1.778 m)     Head Circumference --      Peak Flow --      Pain Score 02/12/19 0651 3     Pain Loc --      Pain Edu? --      Excl. in [place]? --    Constitutional: Alert and oriented. Well appearing and in no acute distress. Eyes: Conjunctivae are normal.  Head: Atraumatic. Neck: No stridor.  Nontender cervical spine to palpation posteriorly. Cardiovascular: Normal rate, regular rhythm. Grossly normal heart sounds.  Good peripheral circulation. Respiratory: Normal respiratory effort.  No retractions. Lungs CTAB.  No point tenderness on palpation left lateral ribs.  No gross deformity noted.  No soft tissue edema or discoloration. Gastrointestinal: Soft and nontender. No distention.  No CVA tenderness. Musculoskeletal: Moves upper and lower extremities without any difficulty.  Normal gait was noted.  Good muscle strength  bilaterally. Neurologic:  Normal speech and language. No gross focal neurologic deficits are appreciated. Skin:  Skin is warm, dry and intact.  Psychiatric: Mood and affect are normal. Speech and behavior are normal.  ____________________________________________   LABS (all labs ordered are listed, but only abnormal results are displayed)  Labs Reviewed  COMPREHENSIVE METABOLIC PANEL - Abnormal; Notable for the following components:      Result Value   Glucose, Bld 154 (*)    BUN 25 (*)    Total Bilirubin 1.5 (*)    All other components within normal limits  CBC WITH DIFFERENTIAL/PLATELET  TROPONIN I  TROPONIN I   ____________________________________________  EKG EKG was read by Dr. Owens Shark and also reviewed by Dr. Corky Downs. Abnormal EKG with sinus rhythm with 1st degree AV block Left atrial enlargement St & T wave abnormality  Vent 71, PR interval 71 QRS duration 84   ____________________________________________  RADIOLOGY   Official radiology report(s): Dg Chest 2 View  Result Date: 02/12/2019 CLINICAL DATA:  Left side chest pain EXAM: CHEST - 2 VIEW COMPARISON:  None. FINDINGS: Heart and mediastinal contours are within normal limits. No focal opacities or effusions. No acute bony abnormality. IMPRESSION: No active cardiopulmonary disease. Electronically Signed   By: Rolm Baptise M.D.   On: 02/12/2019 08:43    ____________________________________________   PROCEDURES  Procedure(s) performed (including Critical Care):  Procedures   ____________________________________________   INITIAL IMPRESSION / ASSESSMENT AND PLAN / ED COURSE  As part of my medical decision making, I reviewed the following data within the electronic MEDICAL RECORD NUMBER Notes from prior ED visits and [place] Controlled Substance Database  Presents to the ED with complaint of left-sided atypical chest wall pain that last for several seconds and has been intermittent for the last 3 to 4 months.   Patient denies any shortness of breath, indigestion, diaphoresis, dizziness or difficulty with daily activities.  He has not taken any over-the-counter medication for this or seen his PCP.  Patient continues to take his regular medication for hypertension and diabetes.  Lab work was reviewed by myself and Dr. Corky Downs.  A second troponin was taken prior to discharge.  This was also negative.  Patient was reassured.  He is to follow-up with his PCP and also the cardiologist on call was given to him for a follow-up unless his PCP would prefer for him to see a different cardiologist.  Patient was encouraged to call and make an appointment today.  He is to return to the emergency department if any severe worsening of his symptoms or urgent concerns.  ____________________________________________   FINAL CLINICAL IMPRESSION(S) / ED DIAGNOSES  Final diagnoses:  Nonspecific chest pain     ED Discharge Orders    None       Note:  This document was prepared using Dragon voice recognition software and may include unintentional dictation errors.    Johnn Hai, PA-C 02/12/19 1721    Lavonia Drafts, MD 02/13/19 1146

## 2019-02-12 NOTE — Discharge Instructions (Signed)
Call make an appointment with your primary care provider.  Also call and make an appointment with the cardiologist listed on your discharge papers.  Today's test results were negative for cardiac work-up.  Both troponin test were negative.  Your EKG did show some nonspecific changes but there was no previous EKG to compare with at the hospital.  Return to the emergency department if any severe worsening of your symptoms or persistent chest pain.  Especially if there is any associated shortness of breath, nausea, dizziness, vomiting or radiation of your pain.

## 2019-03-19 ENCOUNTER — Encounter: Payer: Self-pay | Admitting: Family Medicine

## 2019-03-19 ENCOUNTER — Ambulatory Visit (INDEPENDENT_AMBULATORY_CARE_PROVIDER_SITE_OTHER): Payer: BLUE CROSS/BLUE SHIELD | Admitting: Family Medicine

## 2019-03-19 ENCOUNTER — Other Ambulatory Visit: Payer: Self-pay

## 2019-03-19 VITALS — BP 130/84 | HR 75 | Temp 97.5°F | Resp 16

## 2019-03-19 DIAGNOSIS — E1169 Type 2 diabetes mellitus with other specified complication: Secondary | ICD-10-CM

## 2019-03-19 DIAGNOSIS — E118 Type 2 diabetes mellitus with unspecified complications: Secondary | ICD-10-CM | POA: Diagnosis not present

## 2019-03-19 DIAGNOSIS — N2581 Secondary hyperparathyroidism of renal origin: Secondary | ICD-10-CM | POA: Diagnosis not present

## 2019-03-19 DIAGNOSIS — E559 Vitamin D deficiency, unspecified: Secondary | ICD-10-CM | POA: Diagnosis not present

## 2019-03-19 DIAGNOSIS — E785 Hyperlipidemia, unspecified: Secondary | ICD-10-CM

## 2019-03-19 DIAGNOSIS — E669 Obesity, unspecified: Secondary | ICD-10-CM

## 2019-03-19 LAB — POCT GLYCOSYLATED HEMOGLOBIN (HGB A1C): Hemoglobin A1C: 9.1 % — AB (ref 4.0–5.6)

## 2019-03-19 MED ORDER — GLIPIZIDE ER 10 MG PO TB24: 10.0000 mg | ORAL_TABLET | Freq: Every day | ORAL | 0 refills | Status: DC

## 2019-03-19 MED ORDER — EMPAGLIFLOZIN 25 MG PO TABS: 25.0000 mg | ORAL_TABLET | Freq: Every day | ORAL | 0 refills | Status: DC

## 2019-03-19 MED ORDER — ROSUVASTATIN CALCIUM 40 MG PO TABS: 40.0000 mg | ORAL_TABLET | ORAL | 1 refills | Status: DC

## 2019-03-19 MED ORDER — VITAMIN D (ERGOCALCIFEROL) 1.25 MG (50000 UNIT) PO CAPS: 50000.0000 [IU] | ORAL_CAPSULE | ORAL | 0 refills | Status: DC

## 2019-03-19 NOTE — Progress Notes (Signed)
Name: [name]   MRN: [id number]    DOB: 05/28/1966   Date:03/19/2019       Progress Note  Subjective  Chief Complaint  Chief Complaint  Patient presents with  . Diabetes  . Hypertension  . Muscle Strain    thinks he pulled or strained a muscle while lifting @ work. Pain is much better than it was.    I connected with@ on 03/19/19 at  9:20 AM EDT by telephone and verified that I am speaking with the correct person using two identifiers.  I discussed the limitations, risks, security and privacy concerns of performing an evaluation and management service by telephone and the availability of in person appointments. Staff also discussed with the patient that there may be a patient responsible charge related to this service. 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 HgbA1C went down to 7.6% on his last visit and is up to 9.1% again, he states he is back eating fast food for breakfast  We will adjust dose of Glipizide to 10 mg but he will also resume a diabetic diet to get A1C to goal. He had urine protein over 300 and sent to nephrologist, he did not go yet . Marland Kitchen He also has large foot ulcer, on left lateral foot, advised to go back to podiatrist but he did not go, he states the ulcer is healing. Explained importance of weight loss.  Discussed egg muffins, avoid bread, past, rice and crackers  Macro albuminuria with secondary hyperparathyroidism: reminded him to go back to nephrologist   HTN:He is on Norvasc and valsartan hctz  and bp today is at goal, he has macroalbuminuria. He has not been back to [facility] recently.Marland Kitchen He denies headaches, palpitation  or chest pain.   Dyslipidemia: he is on Crestor, but only taking it about 5  times a week, denies myalgia, explained importance of compliance. We will change rx to 60 for 90 days    Patient Active Problem List   Diagnosis Date Noted  . Type 2 diabetes mellitus with left diabetic foot ulcer ([place]) 12/18/2018  . Type 2 diabetes mellitus with chronic kidney disease and hypertension ([place]) 12/18/2018  . Type 2 diabetes mellitus with macroalbuminuric diabetic nephropathy ([place]) 12/18/2018  . Dyslipidemia associated with type 2 diabetes mellitus ([place]) 07/03/2018  . Vitamin D deficiency 04/11/2018  . ED (erectile dysfunction) 04/10/2018  . Lower urinary tract symptoms (LUTS) 02/09/2017  . Hypertension 07/05/2015  . Diabetes mellitus type 2, uncontrolled ([place]) 07/05/2015  . HLD (hyperlipidemia) 07/05/2015    History reviewed. No pertinent surgical history.  Family History  Problem Relation Age of Onset  . Heart disease Father   . Heart attack Father   . Healthy Mother   . Prostate cancer Neg Hx   . Kidney cancer Neg Hx   . Bladder Cancer Neg Hx     Social History   Socioeconomic History  . Marital status: Married    Spouse name: Not on file  .  Number of children: Not on file  . Years of education: Not on file  . Highest education level: Not on file  Occupational History  . Not on file  Social Needs  . Financial resource strain: Not on file  . Food insecurity:    Worry: Not on file    Inability: Not on file  . Transportation needs:    Medical: Not on file    Non-medical: Not on file  Tobacco Use  . Smoking status: Never Smoker  . Smokeless tobacco: Never Used  Substance and Sexual Activity  . Alcohol use: Yes    Alcohol/week: 0.0 standard drinks    Comment: Occasional  . Drug use: No  . Sexual activity: Yes  Lifestyle  . Physical  activity:    Days per week: Not on file    Minutes per session: Not on file  . Stress: Not on file  Relationships  . Social connections:    Talks on phone: Not on file    Gets together: Not on file    Attends religious service: Not on file    Active member of club or organization: Not on file    Attends meetings of clubs or organizations: Not on file    Relationship status: Not on file  . Intimate partner violence:    Fear of current or ex partner: Not on file    Emotionally abused: Not on file    Physically abused: Not on file    Forced sexual activity: Not on file  Other Topics Concern  . Not on file  Social History Narrative  . Not on file     Current Outpatient Medications:  .  ALPRAZolam (XANAX) 0.5 MG tablet, Take 1 tablet (0.5 mg total) by mouth daily as needed for anxiety. 30 minutes before doctor's visits, Disp: 5 tablet, Rfl: 0 .  amLODipine (NORVASC) 10 MG tablet, Take 1 tablet (10 mg total) by mouth daily., Disp: 90 tablet, Rfl: 0 .  aspirin 81 MG tablet, Take by mouth., Disp: , Rfl:  .  empagliflozin (JARDIANCE) 25 MG TABS tablet, Take 25 mg by mouth daily., Disp: 90 tablet, Rfl: 0 .  glipiZIDE (GLUCOTROL XL) 5 MG 24 hr tablet, Take 1 tablet (5 mg total) by mouth daily with breakfast., Disp: 90 tablet, Rfl: 0 .  glucose blood (BAYER CONTOUR NEXT TEST) test strip, BAYER CONTOUR NEXT TEST (In Vitro Strip)  1 (one) Strip Strip three times daily for 90 days  Quantity: 1;  Refills: 3   Ordered :03-Feb-2013  [name] ;  Started 03-Feb-2013 Active, Disp: , Rfl:  .  metFORMIN (GLUCOPHAGE) 1000 MG tablet, Take 1 tablet (1,000 mg total) by mouth 2 (two) times daily with a meal., Disp: 180 tablet, Rfl: 1 .  rosuvastatin (CRESTOR) 40 MG tablet, Take 1 tablet (40 mg total) by mouth daily. In place of atorvastatin, Disp: 90 tablet, Rfl: 1 .  valsartan-hydrochlorothiazide (DIOVAN-HCT) 320-25 MG tablet, Take 1 tablet by mouth daily. In place of lisinopril hctz, Disp: 90 tablet, Rfl:  1 .  Vitamin D, Ergocalciferol, (DRISDOL) 1.25 MG (50000 UT) CAPS capsule, Take 1 capsule (50,000 Units total) by mouth every 7 (seven) days. For 12 weeks, Disp: 12 capsule, Rfl: 0  No Known Allergies  I personally reviewed active problem list, medication list, allergies, family history, social history with the patient/caregiver today.   ROS  Ten systems reviewed and is negative except as mentioned in HPI   Objective  Virtual encounter, vitals not obtained.  There is no height or weight on file to calculate BMI.  Physical Exam  Awake, alert and in no distress   PHQ2/9: Depression screen [facility] 2/9 03/19/2019 12/18/2018 09/04/2018 07/03/2018 01/16/2018  Decreased Interest 0 0 0 0 0  Down, Depressed, Hopeless 0 0 0 0 0  PHQ - 2 Score 0 0 0 0 0  Altered sleeping 0 - - - -  Tired, decreased energy 0 - - - -  Change in appetite 0 - - - -  Feeling bad or failure about yourself  0 - - - -  Trouble concentrating 0 - - - -  Moving slowly or fidgety/restless 0 - - - -  Suicidal thoughts 0 - - - -  PHQ-9 Score 0 - - - -   PHQ-2/9 Result is negative.    Fall Risk: Fall Risk  03/19/2019 12/18/2018 09/04/2018 07/03/2018 01/16/2018  Falls in the past year? 0 0 No No No  Number falls in past yr: 0 0 - - -  Injury with Fall? 0 0 - - -     Assessment & Plan  1. Type 2 diabetes with complication (HCC)  - empagliflozin (JARDIANCE) 25 MG TABS tablet; Take 25 mg by mouth daily.  Dispense: 90 tablet; Refill: 0 - glipiZIDE (GLUCOTROL XL) 10 MG 24 hr tablet; Take 1 tablet (10 mg total) by mouth daily with breakfast.  Dispense: 90 tablet; Refill: 0 - POCT HgB A1C  2. Dyslipidemia associated with type 2 diabetes mellitus (HCC)  - empagliflozin (JARDIANCE) 25 MG TABS tablet; Take 25 mg by mouth daily.  Dispense: 90 tablet; Refill: 0 - glipiZIDE (GLUCOTROL XL) 10 MG 24 hr tablet; Take 1 tablet (10 mg total) by mouth daily with breakfast.  Dispense: 90 tablet; Refill: 0 - rosuvastatin (CRESTOR) 40 MG  tablet; Take 1 tablet (40 mg total) by mouth every other day. Usually takes M-F  Dispense: 60 tablet; Refill: 1 - POCT HgB A1C  3. Vitamin D deficiency  - Vitamin D, Ergocalciferol, (DRISDOL) 1.25 MG (50000 UT) CAPS capsule; Take 1 capsule (50,000 Units total) by mouth every 7 (seven) days. For 12 weeks  Dispense: 12 capsule; Refill: 0  4. Type 2 diabetes mellitus with obesity (HCC)  - empagliflozin (JARDIANCE) 25 MG TABS tablet; Take 25 mg by mouth daily.  Dispense: 90 tablet; Refill: 0 - glipiZIDE (GLUCOTROL XL) 10 MG 24 hr tablet; Take 1 tablet (10 mg total) by mouth daily with breakfast.  Dispense: 90 tablet; Refill: 0 - POCT HgB A1C  5. Secondary renal hyperparathyroidism ([place])  Needs to see nephrologist   I discussed the assessment and treatment plan with the patient. The patient was provided an opportunity to ask questions and all were answered. The patient agreed with the plan and demonstrated an understanding of the instructions.   The patient was advised to call back or seek an in-person evaluation if the symptoms worsen or if the condition fails to improve as anticipated.  I provided 25  minutes of non-face-to-face time during this encounter.  Loistine Chance, MD

## 2019-03-20 ENCOUNTER — Telehealth: Payer: Self-pay | Admitting: Family Medicine

## 2019-03-20 NOTE — Telephone Encounter (Signed)
Copied from [place] [id number]. Topic: General - Inquiry >> Mar 20, 2019 10:06 AM Margot Ables wrote: Reason for CRM: pt asking what he can do to reduce the foam in his urine (from proteins he says). Pt requesting call back at [phone].

## 2019-03-21 NOTE — Telephone Encounter (Signed)
Spoke with patient and he states he has been to [facility] but they did not prescribe anything for his microalbuminuria. That is why he states he has protein in his urine and goggling his foam in his urine. He states he was just concerned about it. Informed him that there was nothing otc but if he has urinary infection symptoms to come in.

## 2019-03-24 NOTE — Telephone Encounter (Signed)
Relayed information from Dr. Ancil Boozer to patient and he verbalized understating.

## 2019-04-13 NOTE — Progress Notes (Signed)
04/14/2019 2:35 PM   [name] 1966/09/03 [id number]  Referring provider: [name], MD [address] [place], [place] [address]  Chief Complaint  Patient presents with  . Benign Prostatic Hypertrophy    HPI: Mr. Whittinghill is a 53 year old African American with BPH with LU TS, nocturia and ED who presents today for a follow up appointment after misplacing samples.  His IPSS score today is 4, which is mild lower urinary tract symptomatology.  He is mostly satisfied with his quality life due to his urinary symptoms. His PVR is 25 mL.  His previous I PSS score 5/1.  His previous PVR was 82 mL.  His main complaint today is nocturia x 2.  Patient denies any gross hematuria, dysuria or suprapubic/flank pain.  Patient denies any fevers, chills, nausea or vomiting.   He does not have a family history of prostate cancer.  He was given samples of Toviaz to see if they would help decrease his nocturia. He lost the samples and does not find the nocturia bothersome at this time.  IPSS    Row Name 04/14/19 1400         International Prostate Symptom Score   How often have you had the sensation of not emptying your bladder?  Not at All     How often have you had to urinate less than every two hours?  Less than 1 in 5 times     How often have you found you stopped and started again several times when you urinated?  Less than 1 in 5 times     How often have you found it difficult to postpone urination?  Not at All     How often have you had a weak urinary stream?  Not at All     How often have you had to strain to start urination?  Not at All     How many times did you typically get up at night to urinate?  2 Times     Total IPSS Score  4       Quality of Life due to urinary symptoms   If you were to spend the rest of your life with your urinary condition just the way it is now how would you feel about that?  Mostly Satisfied        Score:  1-7 Mild 8-19 Moderate 20-35  Severe  Erectile dysfunction His SHIM score today is 9, which is moderate ED.  His previous SHIM was 15.  He has been having difficulty with erections for last several years.   His major complaint is lack of firmness with erections.  His libido is preserved.   His risk factors for ED are age, BPH, DM, HTN, HLD, alcohol abuse and blood pressure medications.  He denies any painful erections or curvatures with his erections.   He is still having spontaneous erections.  He has been having okay results with sildenafil and would like a script for Levitra.  SHIM    Row Name 04/14/19 1411         SHIM: Over the last 6 months:   How do you rate your confidence that you could get and keep an erection?  Very Low     When you had erections with sexual stimulation, how often were your erections hard enough for penetration (entering your partner)?  A Few Times (much less than half the time)     During sexual intercourse, how  often were you able to maintain your erection after you had penetrated (entered) your partner?  A Few Times (much less than half the time)     During sexual intercourse, how difficult was it to maintain your erection to completion of intercourse?  Very Difficult     When you attempted sexual intercourse, how often was it satisfactory for you?  A Few Times (much less than half the time)       SHIM Total Score   SHIM  9         PMH: Past Medical History:  Diagnosis Date  . Diabetes mellitus without complication ([place])   . Hyperlipidemia   . Hypertension     Surgical History: No past surgical history on file.  Home Medications:  Allergies as of 04/14/2019   No Known Allergies     Medication List       Accurate as of Apr 14, 2019  2:35 PM. Always use your most recent med list.        amLODipine 10 MG tablet Commonly known as:  NORVASC Take 1 tablet (10 mg total) by mouth daily.   aspirin 81 MG tablet Take by mouth.   Bayer Contour Next Test test strip Generic drug:   glucose blood BAYER CONTOUR NEXT TEST (In Vitro Strip)  1 (one) Strip Strip three times daily for 90 days  Quantity: 1;  Refills: 3   Ordered :03-Feb-2013  [name] ;  Started 03-Feb-2013 Active   empagliflozin 25 MG Tabs tablet Commonly known as:  Jardiance Take 25 mg by mouth daily.   glipiZIDE 10 MG 24 hr tablet Commonly known as:  GLUCOTROL XL Take 1 tablet (10 mg total) by mouth daily with breakfast.   metFORMIN 1000 MG tablet Commonly known as:  GLUCOPHAGE Take 1 tablet (1,000 mg total) by mouth 2 (two) times daily with a meal.   rosuvastatin 40 MG tablet Commonly known as:  CRESTOR Take 1 tablet (40 mg total) by mouth every other day. Usually takes M-F   valsartan-hydrochlorothiazide 320-25 MG tablet Commonly known as:  DIOVAN-HCT Take 1 tablet by mouth daily. In place of lisinopril hctz   vardenafil 20 MG tablet Commonly known as:  Levitra Take 1 tablet (20 mg total) by mouth daily as needed for erectile dysfunction.   Vitamin D (Ergocalciferol) 1.25 MG (50000 UT) Caps capsule Commonly known as:  DRISDOL Take 1 capsule (50,000 Units total) by mouth every 7 (seven) days. For 12 weeks       Allergies: No Known Allergies  Family History: Family History  Problem Relation Age of Onset  . Heart disease Father   . Heart attack Father   . Healthy Mother   . Prostate cancer Neg Hx   . Kidney cancer Neg Hx   . Bladder Cancer Neg Hx     Social History:  reports that he has never smoked. He has never used smokeless tobacco. He reports current alcohol use. He reports that he does not use drugs.  ROS: UROLOGY Frequent Urination?: No Hard to postpone urination?: No Burning/pain with urination?: No Get up at night to urinate?: Yes Leakage of urine?: No Urine stream starts and stops?: No Trouble starting stream?: No Do you have to strain to urinate?: No Blood in urine?: No Urinary tract infection?: No Sexually transmitted disease?: No Injury to kidneys  or bladder?: No Painful intercourse?: No Weak stream?: No Erection problems?: Yes Penile pain?: No  Gastrointestinal Nausea?: No Vomiting?: No Indigestion/heartburn?: No Diarrhea?:  No Constipation?: No  Constitutional Fever: No Night sweats?: No Weight loss?: No Fatigue?: No  Skin Skin rash/lesions?: No Itching?: No  Eyes Blurred vision?: No Double vision?: No  Ears/Nose/Throat Sore throat?: No Sinus problems?: No  Hematologic/Lymphatic Swollen glands?: No Easy bruising?: No  Cardiovascular Leg swelling?: No Chest pain?: No  Respiratory Cough?: No Shortness of breath?: No  Endocrine Excessive thirst?: No  Musculoskeletal Back pain?: No Joint pain?: No  Neurological Headaches?: No Dizziness?: No  Psychologic Depression?: No Anxiety?: No  Physical Exam: BP (!) 169/91   Pulse 77   Ht 5\' 11"  (1.803 m)   Wt 217 lb (98.4 kg)   BMI 30.27 kg/m   Constitutional:  Well nourished. Alert and oriented, No acute distress. HEENT: [place] AT, moist mucus membranes.  Trachea midline, no masses. Cardiovascular: No clubbing, cyanosis, or edema. Respiratory: Normal respiratory effort, no increased work of breathing. GI: Abdomen is soft, non tender, non distended, no abdominal masses. Liver and spleen not palpable.  No hernias appreciated.  Stool sample for occult testing is not indicated.   GU: No CVA tenderness.  No bladder fullness or masses.  Patient with circumcised phallus.  Urethral meatus is patent.  No penile discharge. No penile lesions or rashes. Scrotum without lesions, cysts, rashes and/or edema.  Testicles are located scrotally bilaterally. No masses are appreciated in the testicles. Left and right epididymis are normal. Rectal: Patient with  normal sphincter tone. Anus and perineum without scarring or rashes. No rectal masses are appreciated. Prostate is approximately 50 grams, no nodules are appreciated. Seminal vesicles are normal. Skin: No rashes,  bruises or suspicious lesions. Lymph: No cervical or inguinal adenopathy. Neurologic: Grossly intact, no focal deficits, moving all 4 extremities. Psychiatric: Normal mood and affect.  Laboratory Data: PSA History  0.6 ng/mL on 02/27/2017   Lab Results  Component Value Date   CREATININE 0.78 02/12/2019    Lab Results  Component Value Date   HGBA1C 9.1 (A) 03/19/2019       Component Value Date/Time   CHOL 234 (H) 12/18/2018 1047   CHOL 247 (H) 06/19/2016 1655   HDL 67 12/18/2018 1047   HDL 60 06/19/2016 1655   CHOLHDL 3.5 12/18/2018 1047   LDLCALC 144 (H) 12/18/2018 1047    Lab Results  Component Value Date   AST 20 02/12/2019   Lab Results  Component Value Date   ALT 21 02/12/2019     Pertinent Imaging: Results for MUATH, HALLAM (MRN [id number]) as of 04/24/2019 13:15  Ref. Range 04/14/2019 14:14  Scan Result Unknown 79ml    Assessment & Plan:    1. BPH with LUTS  - IPSS score is 4/2, it is stable   - Continue conservative management, avoiding bladder irritants and timed voiding's  - Not having bothersome symptoms at this time  - BLADDER SCAN AMB NON-IMAGING  - PSA drawn today - follow up in one year if PSA is normal  - RTC in one year for I PSS, PSA and exam   2. Nocturia  - he has not had a sleep study  3. Erectile dysfunction  - script given for Levitra 20 mg - take one tablet two hours prior to intercourse on an empty stomach,  he is warned not to take medications that contain nitrates.  I also advised him of the side effects, such as: headache, flushing, dyspepsia, abnormal vision, nasal congestion, back pain, myalgia, nausea, dizziness, and rash.  - RTC in 12 months for repeat SHIM  score and exam   Return in about 1 year (around 04/13/2020) for IPSS, SHIM, PSA and exam.  These notes generated with voice recognition software. I apologize for typographical errors.  Zara Council, PA-C  [facility] [address] [place], [place] [address] [phone]

## 2019-04-14 ENCOUNTER — Ambulatory Visit (INDEPENDENT_AMBULATORY_CARE_PROVIDER_SITE_OTHER): Payer: BLUE CROSS/BLUE SHIELD | Admitting: Urology

## 2019-04-14 ENCOUNTER — Encounter: Payer: Self-pay | Admitting: Urology

## 2019-04-14 ENCOUNTER — Other Ambulatory Visit: Payer: Self-pay

## 2019-04-14 VITALS — BP 169/91 | HR 77 | Ht 71.0 in | Wt 217.0 lb

## 2019-04-14 DIAGNOSIS — N138 Other obstructive and reflux uropathy: Secondary | ICD-10-CM

## 2019-04-14 DIAGNOSIS — N401 Enlarged prostate with lower urinary tract symptoms: Secondary | ICD-10-CM | POA: Diagnosis not present

## 2019-04-14 DIAGNOSIS — N529 Male erectile dysfunction, unspecified: Secondary | ICD-10-CM | POA: Diagnosis not present

## 2019-04-14 DIAGNOSIS — R351 Nocturia: Secondary | ICD-10-CM | POA: Diagnosis not present

## 2019-04-14 LAB — BLADDER SCAN AMB NON-IMAGING

## 2019-04-14 MED ORDER — VARDENAFIL HCL 20 MG PO TABS: 20.0000 mg | ORAL_TABLET | Freq: Every day | ORAL | 3 refills | Status: DC | PRN

## 2019-04-15 ENCOUNTER — Telehealth: Payer: Self-pay

## 2019-04-15 LAB — PSA: Prostate Specific Ag, Serum: 0.5 ng/mL (ref 0.0–4.0)

## 2019-04-15 MED ORDER — TADALAFIL 5 MG PO TABS: 5.0000 mg | ORAL_TABLET | Freq: Every day | ORAL | 6 refills | Status: DC | PRN

## 2019-04-15 NOTE — Telephone Encounter (Signed)
Patient notified on vmail per DPR medication sent

## 2019-04-15 NOTE — Telephone Encounter (Signed)
We can send in a script for Cialis 5 mg, # 30, 2 to 4 tablets prior to intercourse to [facility].  He will have to get the GoodRx app for the discount.  It will be ~ $15.00.

## 2019-04-15 NOTE — Telephone Encounter (Signed)
Patient notified he states his ins will not cover levitra and wanted to know if we could send in something else for him

## 2019-04-15 NOTE — Telephone Encounter (Signed)
-----   Message from Nori Riis, PA-C sent at 04/15/2019 10:41 AM EDT ----- Would you let Mr. Korn know that his PSA was normal?

## 2019-04-16 DIAGNOSIS — I129 Hypertensive chronic kidney disease with stage 1 through stage 4 chronic kidney disease, or unspecified chronic kidney disease: Secondary | ICD-10-CM | POA: Diagnosis not present

## 2019-04-16 DIAGNOSIS — N189 Chronic kidney disease, unspecified: Secondary | ICD-10-CM | POA: Diagnosis not present

## 2019-04-16 DIAGNOSIS — R809 Proteinuria, unspecified: Secondary | ICD-10-CM | POA: Diagnosis not present

## 2019-04-16 DIAGNOSIS — E1122 Type 2 diabetes mellitus with diabetic chronic kidney disease: Secondary | ICD-10-CM | POA: Diagnosis not present

## 2019-06-02 ENCOUNTER — Other Ambulatory Visit: Payer: Self-pay | Admitting: Nurse Practitioner

## 2019-06-02 ENCOUNTER — Telehealth: Payer: Self-pay | Admitting: Urology

## 2019-06-02 DIAGNOSIS — I10 Essential (primary) hypertension: Secondary | ICD-10-CM

## 2019-06-02 NOTE — Telephone Encounter (Signed)
We can send in a script for Cialis (tadalafil) 20 mg, # 30.  Take one tablet up to two hours prior to intercourse on an empty stomach with three refill.

## 2019-06-02 NOTE — Telephone Encounter (Signed)
Pt wants to know if you can up the dosage on his Cialis.He is getting ready to get a refill and he would like it changed before he picks up refill.  Please advise.

## 2019-06-02 NOTE — Telephone Encounter (Signed)
Spoke to patient and he does not like taking 4 tablets. He prefers to only take 1 tablet. He states 20MG  works best for him.

## 2019-06-02 NOTE — Telephone Encounter (Signed)
Can patient get a higher dose of Cialis sent to pharmacy?

## 2019-06-02 NOTE — Telephone Encounter (Signed)
I will need some clarification.  The prescription is written for Cialis (tadalafil) 5 mg and he can take up to 4 tablets at one time and this is the maximum number of the Cialis (tadalafil) 5 mg he can take.  Is he wanting a prescriptions a bigger quantity of tablets or a prescription for Cialis (tadalafil) 20 mg.  He can only take one of those tablets prior to intercourse.

## 2019-06-03 MED ORDER — TADALAFIL 20 MG PO TABS: ORAL_TABLET | ORAL | 3 refills | Status: DC

## 2019-06-03 NOTE — Telephone Encounter (Signed)
Patient notified and RX sent  

## 2019-06-19 ENCOUNTER — Other Ambulatory Visit: Payer: Self-pay | Admitting: Family Medicine

## 2019-06-19 DIAGNOSIS — E118 Type 2 diabetes mellitus with unspecified complications: Secondary | ICD-10-CM

## 2019-06-19 DIAGNOSIS — E1169 Type 2 diabetes mellitus with other specified complication: Secondary | ICD-10-CM

## 2019-07-02 ENCOUNTER — Other Ambulatory Visit: Payer: Self-pay

## 2019-07-02 ENCOUNTER — Ambulatory Visit: Payer: BLUE CROSS/BLUE SHIELD | Admitting: Family Medicine

## 2019-07-02 ENCOUNTER — Encounter: Payer: Self-pay | Admitting: Family Medicine

## 2019-07-02 VITALS — BP 164/94 | HR 69 | Temp 97.6°F | Resp 16 | Ht 71.0 in

## 2019-07-02 DIAGNOSIS — E559 Vitamin D deficiency, unspecified: Secondary | ICD-10-CM | POA: Diagnosis not present

## 2019-07-02 DIAGNOSIS — L97521 Non-pressure chronic ulcer of other part of left foot limited to breakdown of skin: Secondary | ICD-10-CM

## 2019-07-02 DIAGNOSIS — R399 Unspecified symptoms and signs involving the genitourinary system: Secondary | ICD-10-CM

## 2019-07-02 DIAGNOSIS — E669 Obesity, unspecified: Secondary | ICD-10-CM

## 2019-07-02 DIAGNOSIS — E1169 Type 2 diabetes mellitus with other specified complication: Secondary | ICD-10-CM

## 2019-07-02 DIAGNOSIS — I10 Essential (primary) hypertension: Secondary | ICD-10-CM

## 2019-07-02 DIAGNOSIS — E785 Hyperlipidemia, unspecified: Secondary | ICD-10-CM | POA: Diagnosis not present

## 2019-07-02 DIAGNOSIS — N2581 Secondary hyperparathyroidism of renal origin: Secondary | ICD-10-CM | POA: Diagnosis not present

## 2019-07-02 DIAGNOSIS — E1121 Type 2 diabetes mellitus with diabetic nephropathy: Secondary | ICD-10-CM

## 2019-07-02 DIAGNOSIS — E119 Type 2 diabetes mellitus without complications: Secondary | ICD-10-CM

## 2019-07-02 DIAGNOSIS — E118 Type 2 diabetes mellitus with unspecified complications: Secondary | ICD-10-CM | POA: Diagnosis not present

## 2019-07-02 LAB — POCT GLYCOSYLATED HEMOGLOBIN (HGB A1C): HbA1c, POC (controlled diabetic range): 7.8 % — AB (ref 0.0–7.0)

## 2019-07-02 MED ORDER — AMLODIPINE BESYLATE 10 MG PO TABS: 10.0000 mg | ORAL_TABLET | Freq: Every day | ORAL | 0 refills | Status: DC

## 2019-07-02 MED ORDER — METFORMIN HCL 1000 MG PO TABS: 1000.0000 mg | ORAL_TABLET | Freq: Two times a day (BID) | ORAL | 1 refills | Status: DC

## 2019-07-02 MED ORDER — ROSUVASTATIN CALCIUM 40 MG PO TABS: 40.0000 mg | ORAL_TABLET | ORAL | 0 refills | Status: DC

## 2019-07-02 MED ORDER — VITAMIN D (ERGOCALCIFEROL) 1.25 MG (50000 UNIT) PO CAPS: 50000.0000 [IU] | ORAL_CAPSULE | ORAL | 0 refills | Status: DC

## 2019-07-02 MED ORDER — JARDIANCE 25 MG PO TABS: 25.0000 mg | ORAL_TABLET | Freq: Every day | ORAL | 0 refills | Status: DC

## 2019-07-02 MED ORDER — VALSARTAN-HYDROCHLOROTHIAZIDE 320-25 MG PO TABS: 1.0000 | ORAL_TABLET | Freq: Every day | ORAL | 1 refills | Status: DC

## 2019-07-02 MED ORDER — GLIPIZIDE ER 10 MG PO TB24: 10.0000 mg | ORAL_TABLET | Freq: Every day | ORAL | 0 refills | Status: DC

## 2019-07-02 NOTE — Progress Notes (Addendum)
Name: [name]   MRN: [id number]    DOB: 26-Sep-1966   Date:07/02/2019       Progress Note  Subjective  Chief Complaint  Chief Complaint  Patient presents with  . Diabetes  . Medication Refill    3 month F/U  . Hypertension  . Dyslipidemia    HPI  DMII: he states he has been compliant of medication but does not check his glucose at home.Heis now taking Glipizide XL 5 mg, Metformin 1000 mg BID and Jardiance. HgbA1C went down to 7.6% on his last visit and is up to 9.1% again but down again at 7.8%  he stopped eating fast food for breakfast. Snacking more on nuts.  Since last visit he is on higher dose of glipizide, also on metformin and also jardiance . He also has large foot ulcer, on left lateral foot, advised to go back to podiatrist but he did not go, he states the ulcer is healing. Discussed referral to podiatrist  Macro albuminuria with secondary hyperparathyroidism: he was seen by nephrologist through telemedicine   HTN:He is on Norvasc and valsartan hctz and bp today is at goal, he has macroalbuminuria, bp is better today but still above goal, he states he is nervous at our office, but advised to monitor at home, goal bp is below 135/80  Dyslipidemia: heis on Crestor, but only taking it about 5  times a week, denies myalgia, explained importance of compliance. We will change rx to 60 for 90 days , recheck labs    Patient Active Problem List   Diagnosis Date Noted  . Secondary renal hyperparathyroidism ([place]) 03/19/2019  . Type 2 diabetes mellitus with left diabetic foot ulcer ([place]) 12/18/2018  . Type 2 diabetes mellitus with chronic kidney disease and hypertension ([place]) 12/18/2018  . Type 2 diabetes mellitus with macroalbuminuric diabetic nephropathy ([place]) 12/18/2018  . Dyslipidemia associated with type 2 diabetes mellitus ([place]) 07/03/2018  . Vitamin D deficiency 04/11/2018  . ED (erectile dysfunction) 04/10/2018  . Lower urinary tract symptoms (LUTS) 02/09/2017  .  Hypertension 07/05/2015  . Diabetes mellitus type 2, uncontrolled ([place]) 07/05/2015  . HLD (hyperlipidemia) 07/05/2015    History reviewed. No pertinent surgical history.  Family History  Problem Relation Age of Onset  . Heart disease Father   . Heart attack Father   . Healthy Mother   . Prostate cancer Neg Hx   . Kidney cancer Neg Hx   . Bladder Cancer Neg Hx     Social History   Socioeconomic History  . Marital status: Married    Spouse name: Not on file  . Number of children: 1  . Years of education: Not on file  . Highest education level: Not on file  Occupational History  . Not on file  Social Needs  . Financial resource strain: Not hard at all  . Food insecurity    Worry: Never true    Inability: Never true  . Transportation needs    Medical: No    Non-medical: No  Tobacco Use  . Smoking status: Never Smoker  . Smokeless tobacco: Never Used  Substance and Sexual Activity  . Alcohol use: Yes    Alcohol/week: 0.0 standard drinks    Comment: Occasional  . Drug use: No  . Sexual activity: Yes  Lifestyle  . Physical activity    Days per week: 4 days    Minutes per session: 30 min  . Stress: Not at all  Relationships  . Social connections  Talks on phone: Not on file    Gets together: Not on file    Attends religious service: Not on file    Active member of club or organization: Not on file    Attends meetings of clubs or organizations: Not on file    Relationship status: Not on file  . Intimate partner violence    Fear of current or ex partner: No    Emotionally abused: No    Physically abused: No    Forced sexual activity: No  Other Topics Concern  . Not on file  Social History Narrative  . Not on file     Current Outpatient Medications:  .  amLODipine (NORVASC) 10 MG tablet, Take 1 tablet (10 mg total) by mouth daily., Disp: 90 tablet, Rfl: 0 .  aspirin 81 MG tablet, Take by mouth., Disp: , Rfl:  .  empagliflozin (JARDIANCE) 25 MG TABS  tablet, Take 25 mg by mouth daily., Disp: 90 tablet, Rfl: 0 .  glipiZIDE (GLUCOTROL XL) 10 MG 24 hr tablet, Take 1 tablet (10 mg total) by mouth daily with breakfast., Disp: 90 tablet, Rfl: 0 .  glucose blood (BAYER CONTOUR NEXT TEST) test strip, BAYER CONTOUR NEXT TEST (In Vitro Strip)  1 (one) Strip Strip three times daily for 90 days  Quantity: 1;  Refills: 3   Ordered :03-Feb-2013  [name] ;  Started 03-Feb-2013 Active, Disp: , Rfl:  .  metFORMIN (GLUCOPHAGE) 1000 MG tablet, Take 1 tablet (1,000 mg total) by mouth 2 (two) times daily with a meal., Disp: 180 tablet, Rfl: 1 .  rosuvastatin (CRESTOR) 40 MG tablet, Take 1 tablet (40 mg total) by mouth every other day. Usually takes M-F, Disp: 60 tablet, Rfl: 0 .  tadalafil (CIALIS) 20 MG tablet, Take 1 tablet up to 2 hours prior to intercourse on an empty stomach, Disp: 30 tablet, Rfl: 3 .  valsartan-hydrochlorothiazide (DIOVAN-HCT) 320-25 MG tablet, Take 1 tablet by mouth daily. In place of lisinopril hctz, Disp: 90 tablet, Rfl: 1 .  Vitamin D, Ergocalciferol, (DRISDOL) 1.25 MG (50000 UT) CAPS capsule, Take 1 capsule (50,000 Units total) by mouth every 7 (seven) days. For 12 weeks, Disp: 12 capsule, Rfl: 0  No Known Allergies  I personally reviewed active problem list, medication list, allergies, family history, social history with the patient/caregiver today.   ROS  Constitutional: Negative for fever or weight change.  Respiratory: Negative for cough and shortness of breath.   Cardiovascular: Negative for chest pain or palpitations.  Gastrointestinal: Negative for abdominal pain, no bowel changes.  Musculoskeletal: Negative for gait problem or joint swelling.  Skin: Negative for rash.  Neurological: Negative for dizziness or headache.  No other specific complaints in a complete review of systems (except as listed in HPI above).  Objective  Vitals:   07/02/19 1146 07/02/19 1222  BP: (!) 158/90 (!) 164/94  Pulse: 69   Resp: 16    Temp: 97.6 F (36.4 C)   TempSrc: Temporal   SpO2: 99%   Height: 5\' 11"  (1.803 m)     Body mass index is 30.27 kg/m.  Physical Exam  Constitutional: Patient appears well-developed and well-nourished. Obese  No distress.  HEENT: head atraumatic, normocephalic, pupils equal and reactive to light,  neck supple Cardiovascular: Normal rate, regular rhythm and normal heart sounds.  No murmur heard. No BLE edema. Pulmonary/Chest: Effort normal and breath sounds normal. No respiratory distress. Abdominal: Soft.  There is no tenderness. Psychiatric: Patient has a normal mood  and affect. behavior is normal. Judgment and thought content normal.  Recent Results (from the past 2160 hour(s))  PSA     Status: None   Collection Time: 04/14/19  2:12 PM  Result Value Ref Range   Prostate Specific Ag, Serum 0.5 0.0 - 4.0 ng/mL    Comment: Roche ECLIA methodology. According to the American Urological Association, Serum PSA should decrease and remain at undetectable levels after radical prostatectomy. The AUA defines biochemical recurrence as an initial PSA value 0.2 ng/mL or greater followed by a subsequent confirmatory PSA value 0.2 ng/mL or greater. Values obtained with different assay methods or kits cannot be used interchangeably. Results cannot be interpreted as absolute evidence of the presence or absence of malignant disease.   BLADDER SCAN AMB NON-IMAGING     Status: None   Collection Time: 04/14/19  2:14 PM  Result Value Ref Range   Scan Result 61ml   POCT HgB A1C     Status: Abnormal   Collection Time: 07/02/19 11:53 AM  Result Value Ref Range   Hemoglobin A1C     HbA1c POC (<> result, manual entry)     HbA1c, POC (prediabetic range)     HbA1c, POC (controlled diabetic range) 7.8 (A) 0.0 - 7.0 %    Diabetic Foot Exam: Diabetic Foot Exam - Simple   Simple Foot Form Diabetic Foot exam was performed with the following findings: Yes 07/02/2019 12:07 PM  Visual Inspection See  comments: Yes Sensation Testing Intact to touch and monofilament testing bilaterally: Yes Pulse Check Posterior Tibialis and Dorsalis pulse intact bilaterally: Yes Comments Large ulcer on  lateral aspect of left foot       PHQ2/9: Depression screen [facility] 2/9 07/02/2019 03/19/2019 12/18/2018 09/04/2018 07/03/2018  Decreased Interest 0 0 0 0 0  Down, Depressed, Hopeless 0 0 0 0 0  PHQ - 2 Score 0 0 0 0 0  Altered sleeping 0 0 - - -  Tired, decreased energy 0 0 - - -  Change in appetite 0 0 - - -  Feeling bad or failure about yourself  0 0 - - -  Trouble concentrating 0 0 - - -  Moving slowly or fidgety/restless 0 0 - - -  Suicidal thoughts 0 0 - - -  PHQ-9 Score 0 0 - - -  Difficult doing work/chores Not difficult at all - - - -    phq 9 is negative   Fall Risk: Fall Risk  07/02/2019 03/19/2019 12/18/2018 09/04/2018 07/03/2018  Falls in the past year? 0 0 0 No No  Number falls in past yr: 0 0 0 - -  Injury with Fall? 0 0 0 - -    Functional Status Survey: Is the patient deaf or have difficulty hearing?: No Does the patient have difficulty seeing, even when wearing glasses/contacts?: Yes Does the patient have difficulty concentrating, remembering, or making decisions?: No Does the patient have difficulty walking or climbing stairs?: No Does the patient have difficulty dressing or bathing?: No Does the patient have difficulty doing errands alone such as visiting a doctor's office or shopping?: No    Assessment & Plan   1. Type 2 diabetes with complication (HCC)  - POCT HgB A1C - Urine Microalbumin w/creat. ratio - glipiZIDE (GLUCOTROL XL) 10 MG 24 hr tablet; Take 1 tablet (10 mg total) by mouth daily with breakfast.  Dispense: 90 tablet; Refill: 0 - metFORMIN (GLUCOPHAGE) 1000 MG tablet; Take 1 tablet (1,000 mg total) by mouth 2 (  two) times daily with a meal.  Dispense: 180 tablet; Refill: 1  2. Vitamin D deficiency  - VITAMIN D 25 Hydroxy (Vit-D Deficiency, Fractures) -  Vitamin D, Ergocalciferol, (DRISDOL) 1.25 MG (50000 UT) CAPS capsule; Take 1 capsule (50,000 Units total) by mouth every 7 (seven) days. For 12 weeks  Dispense: 12 capsule; Refill: 0  3. Dyslipidemia associated with type 2 diabetes mellitus (HCC)  - Lipid panel - glipiZIDE (GLUCOTROL XL) 10 MG 24 hr tablet; Take 1 tablet (10 mg total) by mouth daily with breakfast.  Dispense: 90 tablet; Refill: 0 - rosuvastatin (CRESTOR) 40 MG tablet; Take 1 tablet (40 mg total) by mouth every other day. Usually takes M-F  Dispense: 60 tablet; Refill: 0  4. Uncontrolled hypertension  - CBC with Differential/Platelet - COMPLETE METABOLIC PANEL WITH GFR - amLODipine (NORVASC) 10 MG tablet; Take 1 tablet (10 mg total) by mouth daily.  Dispense: 90 tablet; Refill: 0 - valsartan-hydrochlorothiazide (DIOVAN-HCT) 320-25 MG tablet; Take 1 tablet by mouth daily. In place of lisinopril hctz  Dispense: 90 tablet; Refill: 1  He will return in one week for CMA visit and bp check, he was very excited talking about conspiracy theory   5. Secondary renal hyperparathyroidism (HCC)  - PTH, intact and calcium  6. Type 2 diabetes mellitus with obesity (HCC)  - glipiZIDE (GLUCOTROL XL) 10 MG 24 hr tablet; Take 1 tablet (10 mg total) by mouth daily with breakfast.  Dispense: 90 tablet; Refill: 0  7. Type 2 diabetes mellitus with macroalbuminuric diabetic nephropathy (HCC)  - COMPLETE METABOLIC PANEL WITH GFR  8. Lower urinary tract symptoms (LUTS)  Sees urologist   9. Skin ulcer of left foot, limited to breakdown of skin ([place])  - Ambulatory referral to Podiatry

## 2019-07-03 LAB — CBC WITH DIFFERENTIAL/PLATELET
Absolute Monocytes: 574 cells/uL (ref 200–950)
Basophils Absolute: 70 cells/uL (ref 0–200)
Basophils Relative: 1.2 %
Eosinophils Absolute: 162 cells/uL (ref 15–500)
Eosinophils Relative: 2.8 %
HCT: 40.3 % (ref 38.5–50.0)
Hemoglobin: 13.7 g/dL (ref 13.2–17.1)
Lymphs Abs: 1322 cells/uL (ref 850–3900)
MCH: 31.4 pg (ref 27.0–33.0)
MCHC: 34 g/dL (ref 32.0–36.0)
MCV: 92.4 fL (ref 80.0–100.0)
MPV: 10.3 fL (ref 7.5–12.5)
Monocytes Relative: 9.9 %
Neutro Abs: 3671 cells/uL (ref 1500–7800)
Neutrophils Relative %: 63.3 %
Platelets: 245 10*3/uL (ref 140–400)
RBC: 4.36 10*6/uL (ref 4.20–5.80)
RDW: 11.7 % (ref 11.0–15.0)
Total Lymphocyte: 22.8 %
WBC: 5.8 10*3/uL (ref 3.8–10.8)

## 2019-07-03 LAB — COMPLETE METABOLIC PANEL WITH GFR
AG Ratio: 1.4 (calc) (ref 1.0–2.5)
ALT: 17 U/L (ref 9–46)
AST: 16 U/L (ref 10–35)
Albumin: 4.1 g/dL (ref 3.6–5.1)
Alkaline phosphatase (APISO): 67 U/L (ref 35–144)
BUN/Creatinine Ratio: 34 (calc) — ABNORMAL HIGH (ref 6–22)
BUN: 29 mg/dL — ABNORMAL HIGH (ref 7–25)
CO2: 25 mmol/L (ref 20–32)
Calcium: 9.7 mg/dL (ref 8.6–10.3)
Chloride: 103 mmol/L (ref 98–110)
Creat: 0.86 mg/dL (ref 0.70–1.33)
GFR, Est African American: 116 mL/min/{1.73_m2} (ref >=60)
GFR, Est Non African American: 100 mL/min/{1.73_m2} (ref >=60)
Globulin: 3 g/dL (calc) (ref 1.9–3.7)
Glucose, Bld: 115 mg/dL — ABNORMAL HIGH (ref 65–99)
Potassium: 3.8 mmol/L (ref 3.5–5.3)
Sodium: 136 mmol/L (ref 135–146)
Total Bilirubin: 1.2 mg/dL (ref 0.2–1.2)
Total Protein: 7.1 g/dL (ref 6.1–8.1)

## 2019-07-03 LAB — LIPID PANEL
Cholesterol: 193 mg/dL (ref <200)
HDL: 62 mg/dL (ref >=40)
LDL Cholesterol (Calc): 103 mg/dL (calc) — ABNORMAL HIGH
Non-HDL Cholesterol (Calc): 131 mg/dL (calc) — ABNORMAL HIGH (ref <130)
Total CHOL/HDL Ratio: 3.1 (calc) (ref <5.0)
Triglycerides: 160 mg/dL — ABNORMAL HIGH (ref <150)

## 2019-07-03 LAB — MICROALBUMIN / CREATININE URINE RATIO
Creatinine, Urine: 66 mg/dL (ref 20–320)
Microalb Creat Ratio: 905 mcg/mg creat — ABNORMAL HIGH (ref <30)
Microalb, Ur: 59.7 mg/dL

## 2019-07-03 LAB — VITAMIN D 25 HYDROXY (VIT D DEFICIENCY, FRACTURES): Vit D, 25-Hydroxy: 32 ng/mL (ref 30–100)

## 2019-07-03 LAB — PTH, INTACT AND CALCIUM
Calcium: 9.7 mg/dL (ref 8.6–10.3)
PTH: 26 pg/mL (ref 14–64)

## 2019-07-09 ENCOUNTER — Ambulatory Visit: Payer: BC Managed Care – PPO

## 2019-07-16 ENCOUNTER — Ambulatory Visit: Payer: BC Managed Care – PPO

## 2019-07-16 ENCOUNTER — Other Ambulatory Visit: Payer: Self-pay

## 2019-07-16 VITALS — BP 168/90 | HR 70

## 2019-07-16 DIAGNOSIS — I10 Essential (primary) hypertension: Secondary | ICD-10-CM

## 2019-07-16 NOTE — Progress Notes (Signed)
Patient is here for a blood pressure check. Patient denies chest pain, palpitations, shortness of breath or visual disturbances. At previous visit blood pressure was 164/94 with a heart rate of 69. Today during nurse visit first check blood pressure was 168/106. After resting for 10 minutes it was 168/90 and heart rate was 70. He does take any blood pressure medications.  Patient states he does not want to start another BP medication, he is going to get a cuff to keep a eye on his BP readings at home and return in 2 weeks with reading and start changing his diet.   Dr. Ancil Boozer advised patient to start Hydralazine and patient declined and states he does not want to take any more BP medications.

## 2019-09-30 ENCOUNTER — Other Ambulatory Visit: Payer: Self-pay | Admitting: Family Medicine

## 2019-09-30 DIAGNOSIS — E559 Vitamin D deficiency, unspecified: Secondary | ICD-10-CM

## 2019-09-30 NOTE — Telephone Encounter (Signed)
Requested medication (s) are due for refill today: yes  Requested medication (s) are on the active medication list: yes  Last refill:  08/27/2019  Future visit scheduled: yes  Notes to clinic: refill cannot be delegated    Requested Prescriptions  Pending Prescriptions Disp Refills   Vitamin D, Ergocalciferol, (DRISDOL) 1.25 MG (50000 UT) CAPS capsule [Pharmacy Med Name: Vitamin D (Ergocalciferol) 50000 UNIT Oral Capsule] 12 capsule 0    Sig: TAKE 1 CAPSULE BY MOUTH ONCE A WEEK FOR  12  WEEKS     Endocrinology:  Vitamins - Vitamin D Supplementation Failed - 09/30/2019 10:43 AM      Failed - 50,000 IU strengths are not delegated      Failed - Phosphate in normal range and within 360 days    No results found for: PHOS       Passed - Ca in normal range and within 360 days    Calcium  Date Value Ref Range Status  07/02/2019 9.7 8.6 - 10.3 mg/dL Final  07/02/2019 9.7 8.6 - 10.3 mg/dL Final         Passed - Vitamin D in normal range and within 360 days    Vit D, 25-Hydroxy  Date Value Ref Range Status  07/02/2019 32 30 - 100 ng/mL Final    Comment:    Vitamin D Status         25-OH Vitamin D: . Deficiency:                    <20 ng/mL Insufficiency:             20 - 29 ng/mL Optimal:                 > or = 30 ng/mL . For 25-OH Vitamin D testing on patients on  D2-supplementation and patients for whom quantitation  of D2 and D3 fractions is required, the Quest[employer]ureD(TM) 25-OH VIT D, (D2,D3), LC/MS/MS is recommended: order  code [phone] (patients >74yrs). See Note 1 . Note 1 . For additional information, please refer to  http://education.Quest[employer].com/faq/FAQ199  (This link is being provided for informational/ educational purposes only.)          Passed - Valid encounter within last 12 months    Recent Outpatient Visits          3 months ago Vitamin D deficiency   [facility] [place], Drue Stager, MD   6 months ago Dyslipidemia associated with  type 2 diabetes mellitus [facility])   [facility] [name], MD   9 months ago Type 2 diabetes with complication [facility])   [facility] [name], MD   1 year ago Type 2 diabetes mellitus with hyperglycemia, without long-term current use of insulin [facility])   [facility] [place], Drue Stager, MD   1 year ago Dyslipidemia associated with type 2 diabetes mellitus [facility])   [facility] [name], MD      Future Appointments            In 1 week [name], MD [facility] [place]   In 6 months [place], Gordan Payment [facility]

## 2019-10-08 ENCOUNTER — Ambulatory Visit: Payer: BC Managed Care – PPO | Admitting: Family Medicine

## 2019-10-14 DIAGNOSIS — R600 Localized edema: Secondary | ICD-10-CM | POA: Insufficient documentation

## 2019-10-14 DIAGNOSIS — R809 Proteinuria, unspecified: Secondary | ICD-10-CM | POA: Insufficient documentation

## 2019-10-15 DIAGNOSIS — E1121 Type 2 diabetes mellitus with diabetic nephropathy: Secondary | ICD-10-CM | POA: Diagnosis not present

## 2019-10-15 DIAGNOSIS — R801 Persistent proteinuria, unspecified: Secondary | ICD-10-CM | POA: Diagnosis not present

## 2019-10-15 DIAGNOSIS — I10 Essential (primary) hypertension: Secondary | ICD-10-CM | POA: Diagnosis not present

## 2019-10-15 DIAGNOSIS — R600 Localized edema: Secondary | ICD-10-CM | POA: Diagnosis not present

## 2019-10-23 DIAGNOSIS — E113513 Type 2 diabetes mellitus with proliferative diabetic retinopathy with macular edema, bilateral: Secondary | ICD-10-CM | POA: Diagnosis not present

## 2019-10-24 DIAGNOSIS — H4311 Vitreous hemorrhage, right eye: Secondary | ICD-10-CM | POA: Diagnosis not present

## 2019-10-24 DIAGNOSIS — H43822 Vitreomacular adhesion, left eye: Secondary | ICD-10-CM | POA: Diagnosis not present

## 2019-10-24 DIAGNOSIS — E113593 Type 2 diabetes mellitus with proliferative diabetic retinopathy without macular edema, bilateral: Secondary | ICD-10-CM | POA: Diagnosis not present

## 2019-10-24 DIAGNOSIS — H3582 Retinal ischemia: Secondary | ICD-10-CM | POA: Diagnosis not present

## 2019-11-03 DIAGNOSIS — E113511 Type 2 diabetes mellitus with proliferative diabetic retinopathy with macular edema, right eye: Secondary | ICD-10-CM | POA: Diagnosis not present

## 2019-11-03 DIAGNOSIS — H4311 Vitreous hemorrhage, right eye: Secondary | ICD-10-CM | POA: Diagnosis not present

## 2019-11-04 DIAGNOSIS — H4311 Vitreous hemorrhage, right eye: Secondary | ICD-10-CM | POA: Diagnosis not present

## 2019-11-04 DIAGNOSIS — Z48810 Encounter for surgical aftercare following surgery on the sense organs: Secondary | ICD-10-CM | POA: Diagnosis not present

## 2019-11-11 DIAGNOSIS — E113511 Type 2 diabetes mellitus with proliferative diabetic retinopathy with macular edema, right eye: Secondary | ICD-10-CM | POA: Diagnosis not present

## 2019-11-11 DIAGNOSIS — E113592 Type 2 diabetes mellitus with proliferative diabetic retinopathy without macular edema, left eye: Secondary | ICD-10-CM | POA: Diagnosis not present

## 2019-11-21 ENCOUNTER — Other Ambulatory Visit: Payer: Self-pay | Admitting: Family Medicine

## 2019-11-21 DIAGNOSIS — E669 Obesity, unspecified: Secondary | ICD-10-CM

## 2019-11-21 DIAGNOSIS — E1169 Type 2 diabetes mellitus with other specified complication: Secondary | ICD-10-CM

## 2019-11-21 DIAGNOSIS — E118 Type 2 diabetes mellitus with unspecified complications: Secondary | ICD-10-CM

## 2019-11-21 DIAGNOSIS — E785 Hyperlipidemia, unspecified: Secondary | ICD-10-CM

## 2019-11-24 NOTE — Telephone Encounter (Signed)
Pt is scheduled °

## 2019-11-26 ENCOUNTER — Other Ambulatory Visit: Payer: Self-pay

## 2019-11-26 ENCOUNTER — Ambulatory Visit (INDEPENDENT_AMBULATORY_CARE_PROVIDER_SITE_OTHER): Payer: Managed Care, Other (non HMO) | Admitting: Family Medicine

## 2019-11-26 ENCOUNTER — Encounter: Payer: Self-pay | Admitting: Family Medicine

## 2019-11-26 DIAGNOSIS — E118 Type 2 diabetes mellitus with unspecified complications: Secondary | ICD-10-CM

## 2019-11-26 DIAGNOSIS — E669 Obesity, unspecified: Secondary | ICD-10-CM

## 2019-11-26 DIAGNOSIS — N521 Erectile dysfunction due to diseases classified elsewhere: Secondary | ICD-10-CM

## 2019-11-26 DIAGNOSIS — E1169 Type 2 diabetes mellitus with other specified complication: Secondary | ICD-10-CM

## 2019-11-26 DIAGNOSIS — I129 Hypertensive chronic kidney disease with stage 1 through stage 4 chronic kidney disease, or unspecified chronic kidney disease: Secondary | ICD-10-CM

## 2019-11-26 DIAGNOSIS — E785 Hyperlipidemia, unspecified: Secondary | ICD-10-CM

## 2019-11-26 DIAGNOSIS — E559 Vitamin D deficiency, unspecified: Secondary | ICD-10-CM | POA: Diagnosis not present

## 2019-11-26 DIAGNOSIS — E119 Type 2 diabetes mellitus without complications: Secondary | ICD-10-CM

## 2019-11-26 DIAGNOSIS — I10 Essential (primary) hypertension: Secondary | ICD-10-CM

## 2019-11-26 DIAGNOSIS — IMO0001 Reserved for inherently not codable concepts without codable children: Secondary | ICD-10-CM

## 2019-11-26 DIAGNOSIS — E1122 Type 2 diabetes mellitus with diabetic chronic kidney disease: Secondary | ICD-10-CM

## 2019-11-26 DIAGNOSIS — N2581 Secondary hyperparathyroidism of renal origin: Secondary | ICD-10-CM

## 2019-11-26 MED ORDER — GLIPIZIDE ER 10 MG PO TB24: 10.0000 mg | ORAL_TABLET | Freq: Every day | ORAL | 0 refills | Status: DC

## 2019-11-26 NOTE — Progress Notes (Signed)
Name: [name]   MRN: [id number]    DOB: [birth year]   Date:11/26/2019       Progress Note  Subjective  Chief Complaint  Chief Complaint  Patient presents with  . Medication Refill    Needs refill on Glipizide.  . Diabetes    I connected with  [name] on 11/26/19 at  3:00 PM EST by telephone and verified that I am speaking with the correct person using two identifiers.  I discussed the limitations, risks, security and privacy concerns of performing an evaluation and management service by telephone and the availability of in person appointments. Staff also discussed with the patient that there may be a patient responsible charge related to this service. Patient Location: at home  Provider Location: [facility]   HPI  DMII: he states he has been compliant of medication but does not check his glucose at home.Heis now taking Glipizide XL 10 mg, Metformin 1000 mg BID and Jardiance. HgbA1Cwent down to 7.6% on his last visit and is up to 9.1%  Down to  7.8% back in July 2020, he will return for labs this week.   Marland Kitchen He also has large foot ulcer, on left lateral foot, advised to go back to podiatristbut he did not go, he states the ulcer is healing . He is up to date with eye exam. He also has CKI and is seeing Dr. Candiss Norse , nephrologist . No polyphagia, polydipsia or polyuria. He states only one episode of hypoglycemia a couple of weeks ago when he skipped a meal  Macro albuminuria with secondary hyperparathyroidism: he kept appointment with nephrologist, but is due for labs, no pruritis, good urine output  HTN:He is on Norvasc and valsartan hctz and last bp was at goal, he also states recent visit to eye doctor bp was 130/78 he has macro albuminuria.. No chest pain or palpitation   Dyslipidemia: heis on Crestor,denies myalgia, he states still only taking it about 5 days a week, we will recheck level. He states he is tired of taking pills.    Patient  Active Problem List   Diagnosis Date Noted  . Secondary renal hyperparathyroidism ([place]) 03/19/2019  . Type 2 diabetes mellitus with left diabetic foot ulcer ([place]) 12/18/2018  . Type 2 diabetes mellitus with chronic kidney disease and hypertension ([place]) 12/18/2018  . Type 2 diabetes mellitus with macroalbuminuric diabetic nephropathy ([place]) 12/18/2018  . Dyslipidemia associated with type 2 diabetes mellitus ([place]) 07/03/2018  . Vitamin D deficiency 04/11/2018  . ED (erectile dysfunction) 04/10/2018  . Lower urinary tract symptoms (LUTS) 02/09/2017  . Hypertension 07/05/2015  . Diabetes mellitus type 2, uncontrolled ([place]) 07/05/2015  . HLD (hyperlipidemia) 07/05/2015    History reviewed. No pertinent surgical history.  Family History  Problem Relation Age of Onset  . Heart disease Father   . Heart attack Father   . Healthy Mother   . Prostate cancer Neg Hx   . Kidney cancer Neg Hx   . Bladder Cancer Neg Hx       Current Outpatient Medications:  .  amLODipine (NORVASC) 10 MG tablet, Take 1 tablet (10 mg total) by mouth daily., Disp: 90 tablet, Rfl: 0 .  aspirin 81 MG tablet, Take by mouth., Disp: , Rfl:  .  DUREZOL 0.05 % EMUL, Place 1 drop into the right eye 2 (two) times daily., Disp: , Rfl:  .  empagliflozin (JARDIANCE) 25 MG TABS tablet, Take 25 mg by mouth daily., Disp: 90  tablet, Rfl: 0 .  glipiZIDE (GLUCOTROL XL) 10 MG 24 hr tablet, Take 1 tablet (10 mg total) by mouth daily with breakfast., Disp: 90 tablet, Rfl: 0 .  glucose blood (BAYER CONTOUR NEXT TEST) test strip, BAYER CONTOUR NEXT TEST (In Vitro Strip)  1 (one) Strip Strip three times daily for 90 days  Quantity: 1;  Refills: 3   Ordered :03-Feb-2013  [name] ;  Started 03-Feb-2013 Active, Disp: , Rfl:  .  metFORMIN (GLUCOPHAGE) 1000 MG tablet, Take 1 tablet (1,000 mg total) by mouth 2 (two) times daily with a meal., Disp: 180 tablet, Rfl: 1 .  rosuvastatin (CRESTOR) 40 MG tablet, Take 1 tablet (40 mg total) by  mouth every other day. Usually takes M-F, Disp: 60 tablet, Rfl: 0 .  tadalafil (CIALIS) 20 MG tablet, Take 1 tablet up to 2 hours prior to intercourse on an empty stomach, Disp: 30 tablet, Rfl: 3 .  torsemide (DEMADEX) 10 MG tablet, Take by mouth., Disp: , Rfl:  .  valsartan-hydrochlorothiazide (DIOVAN-HCT) 320-25 MG tablet, Take 1 tablet by mouth daily. In place of lisinopril hctz, Disp: 90 tablet, Rfl: 1 .  Vitamin D, Ergocalciferol, (DRISDOL) 1.25 MG (50000 UT) CAPS capsule, TAKE 1 CAPSULE BY MOUTH ONCE A WEEK FOR  12  WEEKS, Disp: 12 capsule, Rfl: 0  No Known Allergies  I personally reviewed active problem list, medication list, allergies, family history, social history, health maintenance with the patient/caregiver today.   ROS  Ten systems reviewed and is negative except as mentioned in HPI   Objective  Virtual encounter, vitals not obtained.  There is no height or weight on file to calculate BMI.  Physical Exam  Awake, alert and oriented   PHQ2/9: Depression screen [facility] 2/9 11/26/2019 07/02/2019 03/19/2019 12/18/2018 09/04/2018  Decreased Interest 0 0 0 0 0  Down, Depressed, Hopeless 0 0 0 0 0  PHQ - 2 Score 0 0 0 0 0  Altered sleeping 0 0 0 - -  Tired, decreased energy 0 0 0 - -  Change in appetite 0 0 0 - -  Feeling bad or failure about yourself  0 0 0 - -  Trouble concentrating 0 0 0 - -  Moving slowly or fidgety/restless 0 0 0 - -  Suicidal thoughts 0 0 0 - -  PHQ-9 Score 0 0 0 - -  Difficult doing work/chores - Not difficult at all - - -   PHQ-2/9 Result is negative.    Fall Risk: Fall Risk  11/26/2019 07/02/2019 03/19/2019 12/18/2018 09/04/2018  Falls in the past year? 0 0 0 0 No  Number falls in past yr: 0 0 0 0 -  Injury with Fall? 0 0 0 0 -     Assessment & Plan  1. Dyslipidemia associated with type 2 diabetes mellitus (HCC)  - COMPLETE METABOLIC PANEL WITH GFR - Hemoglobin A1c - Lipid panel - glipiZIDE (GLUCOTROL XL) 10 MG 24 hr tablet; Take 1 tablet (10  mg total) by mouth daily with breakfast.  Dispense: 30 tablet; Refill: 0  2. Essential hypertension  Advised bp monitor at home  3. Vitamin D deficiency  Last level was at goal   4. Dyslipidemia  Discussed importance of compliance  5. Secondary renal hyperparathyroidism ([place])  Under the care of nephrologist  6. Erectile dysfunction due to diseases classified elsewhere   7. Type 2 diabetes mellitus with chronic kidney disease and hypertension (HCC)  - COMPLETE METABOLIC PANEL WITH GFR  8. Type  2 diabetes mellitus with obesity (HCC)  - COMPLETE METABOLIC PANEL WITH GFR - glipiZIDE (GLUCOTROL XL) 10 MG 24 hr tablet; Take 1 tablet (10 mg total) by mouth daily with breakfast.  Dispense: 30 tablet; Refill: 0  I discussed the assessment and treatment plan with the patient. The patient was provided an opportunity to ask questions and all were answered. The patient agreed with the plan and demonstrated an understanding of the instructions.   The patient was advised to call back or seek an in-person evaluation if the symptoms worsen or if the condition fails to improve as anticipated.  I provided 25  minutes of non-face-to-face time during this encounter.  Loistine Chance, MD

## 2019-11-29 LAB — COMPLETE METABOLIC PANEL WITH GFR
AG Ratio: 1.3 (calc) (ref 1.0–2.5)
ALT: 17 U/L (ref 9–46)
AST: 14 U/L (ref 10–35)
Albumin: 4.1 g/dL (ref 3.6–5.1)
Alkaline phosphatase (APISO): 73 U/L (ref 35–144)
BUN/Creatinine Ratio: 25 (calc) — ABNORMAL HIGH (ref 6–22)
BUN: 29 mg/dL — ABNORMAL HIGH (ref 7–25)
CO2: 28 mmol/L (ref 20–32)
Calcium: 10.2 mg/dL (ref 8.6–10.3)
Chloride: 98 mmol/L (ref 98–110)
Creat: 1.17 mg/dL (ref 0.70–1.33)
GFR, Est African American: 83 mL/min/{1.73_m2} (ref >=60)
GFR, Est Non African American: 71 mL/min/{1.73_m2} (ref >=60)
Globulin: 3.1 g/dL (calc) (ref 1.9–3.7)
Glucose, Bld: 260 mg/dL — ABNORMAL HIGH (ref 65–99)
Potassium: 4.4 mmol/L (ref 3.5–5.3)
Sodium: 138 mmol/L (ref 135–146)
Total Bilirubin: 0.9 mg/dL (ref 0.2–1.2)
Total Protein: 7.2 g/dL (ref 6.1–8.1)

## 2019-11-29 LAB — HEMOGLOBIN A1C
Hgb A1c MFr Bld: 9.4 % of total Hgb — ABNORMAL HIGH (ref <5.7)
Mean Plasma Glucose: 223 (calc)
eAG (mmol/L): 12.4 (calc)

## 2019-11-29 LAB — LIPID PANEL
Cholesterol: 190 mg/dL (ref <200)
HDL: 46 mg/dL (ref >=40)
LDL Cholesterol (Calc): 114 mg/dL (calc) — ABNORMAL HIGH
Non-HDL Cholesterol (Calc): 144 mg/dL (calc) — ABNORMAL HIGH (ref <130)
Total CHOL/HDL Ratio: 4.1 (calc) (ref <5.0)
Triglycerides: 178 mg/dL — ABNORMAL HIGH (ref <150)

## 2019-12-02 ENCOUNTER — Other Ambulatory Visit: Payer: Self-pay | Admitting: Family Medicine

## 2019-12-02 DIAGNOSIS — E1165 Type 2 diabetes mellitus with hyperglycemia: Secondary | ICD-10-CM

## 2019-12-03 ENCOUNTER — Other Ambulatory Visit: Payer: Self-pay | Admitting: Family Medicine

## 2019-12-03 DIAGNOSIS — E785 Hyperlipidemia, unspecified: Secondary | ICD-10-CM

## 2019-12-03 DIAGNOSIS — E1169 Type 2 diabetes mellitus with other specified complication: Secondary | ICD-10-CM

## 2019-12-03 DIAGNOSIS — I10 Essential (primary) hypertension: Secondary | ICD-10-CM

## 2019-12-03 MED ORDER — ROSUVASTATIN CALCIUM 40 MG PO TABS: 40.0000 mg | ORAL_TABLET | ORAL | 1 refills | Status: DC

## 2019-12-03 NOTE — Telephone Encounter (Signed)
Pharmacy called for pt to request a refill of rosuvastatin (CRESTOR) 40 MG tablet  Pt states he is supposed to be taking daily now, not EOD.  Pt needs new Rx sent to   [facility] (N), [address] Phone:  [phone]  Fax:  [phone]

## 2019-12-22 ENCOUNTER — Other Ambulatory Visit: Payer: Self-pay | Admitting: Family Medicine

## 2019-12-22 DIAGNOSIS — E559 Vitamin D deficiency, unspecified: Secondary | ICD-10-CM

## 2019-12-22 NOTE — Telephone Encounter (Signed)
Requested medication (s) are due for refill today: yes Requested medication (s) are on the active medication list: yes  Last refill:  09/30/2019 / Future visit scheduled: yes  Notes to clinic:  Vitamins - vitamin D supplementation failed. 50,000 IU strengths are not delegated.  Requested Prescriptions  Pending Prescriptions Disp Refills   Vitamin D, Ergocalciferol, (DRISDOL) 1.25 MG (50000 UNIT) CAPS capsule [Pharmacy Med Name: Vitamin D (Ergocalciferol) 1.25 MG (50000 UT) Oral Capsule] 12 capsule 0    Sig: TAKE 1 CAPSULE BY MOUITH ONCE A WEEK FOR 12 WEEKS.      Endocrinology:  Vitamins - Vitamin D Supplementation Failed - 12/22/2019  5:36 PM      Failed - 50,000 IU strengths are not delegated      Failed - Phosphate in normal range and within 360 days    No results found for: PHOS        Passed - Ca in normal range and within 360 days    Calcium  Date Value Ref Range Status  11/28/2019 10.2 8.6 - 10.3 mg/dL Final          Passed - Vitamin D in normal range and within 360 days    Vit D, 25-Hydroxy  Date Value Ref Range Status  07/02/2019 32 30 - 100 ng/mL Final    Comment:    Vitamin D Status         25-OH Vitamin D: . Deficiency:                    <20 ng/mL Insufficiency:             20 - 29 ng/mL Optimal:                 > or = 30 ng/mL . For 25-OH Vitamin D testing on patients on  D2-supplementation and patients for whom quantitation  of D2 and D3 fractions is required, the QuestAssureD(TM) 25-OH VIT D, (D2,D3), LC/MS/MS is recommended: order  code [phone] (patients >2yrs). See Note 1 . Note 1 . For additional information, please refer to  http://education.QuestDiagnostics.com/faq/FAQ199  (This link is being provided for informational/ educational purposes only.)           Passed - Valid encounter within last 12 months    Recent Outpatient Visits           3 weeks ago Dyslipidemia associated with type 2 diabetes mellitus [facility])   [place] [name], MD   5 months ago Vitamin D deficiency   [facility] [place], Drue Stager, MD   9 months ago Dyslipidemia associated with type 2 diabetes mellitus [facility])   [facility] [name], MD   1 year ago Type 2 diabetes with complication [facility])   [facility] [name], MD   1 year ago Type 2 diabetes mellitus with hyperglycemia, without long-term current use of insulin [facility])   [facility] [name], MD       Future Appointments             In 2 months Ancil Boozer, Drue Stager, MD [facility] [place]   In 3 months McGowan, Gordan Payment [facility]

## 2020-01-09 ENCOUNTER — Other Ambulatory Visit: Payer: Self-pay

## 2020-01-09 DIAGNOSIS — E118 Type 2 diabetes mellitus with unspecified complications: Secondary | ICD-10-CM

## 2020-01-09 DIAGNOSIS — E669 Obesity, unspecified: Secondary | ICD-10-CM

## 2020-01-09 DIAGNOSIS — E1169 Type 2 diabetes mellitus with other specified complication: Secondary | ICD-10-CM

## 2020-01-09 NOTE — Telephone Encounter (Signed)
Request for diabetes medication. Glipizide to [facility]  Last office visit pertaining to diabetes:  Lab Results  Component Value Date   HGBA1C 9.4 (H) 11/28/2019     Follow up on 02/27/2020

## 2020-01-10 MED ORDER — GLIPIZIDE ER 10 MG PO TB24: 10.0000 mg | ORAL_TABLET | Freq: Every day | ORAL | 1 refills | Status: DC

## 2020-01-24 ENCOUNTER — Other Ambulatory Visit: Payer: Self-pay | Admitting: Family Medicine

## 2020-01-24 DIAGNOSIS — E118 Type 2 diabetes mellitus with unspecified complications: Secondary | ICD-10-CM

## 2020-01-24 DIAGNOSIS — E1169 Type 2 diabetes mellitus with other specified complication: Secondary | ICD-10-CM

## 2020-01-24 DIAGNOSIS — E669 Obesity, unspecified: Secondary | ICD-10-CM

## 2020-01-24 DIAGNOSIS — I10 Essential (primary) hypertension: Secondary | ICD-10-CM

## 2020-01-24 NOTE — Telephone Encounter (Signed)
Requested Prescriptions  Pending Prescriptions Disp Refills  . valsartan-hydrochlorothiazide (DIOVAN-HCT) 320-25 MG tablet [Pharmacy Med Name: Valsartan-hydroCHLOROthiazide 320-25 MG Oral Tablet] 90 tablet 0    Sig: TAKE 1 TABLET BY MOUTH ONCE DAILY (IN  PLACE  OF  LISINOTPRIL  HCTZ)     Cardiovascular: ARB + Diuretic Combos Failed - 01/24/2020 10:37 AM      Failed - Last BP in normal range    BP Readings from Last 1 Encounters:  07/16/19 (!) 168/90         Passed - K in normal range and within 180 days    Potassium  Date Value Ref Range Status  11/28/2019 4.4 3.5 - 5.3 mmol/L Final         Passed - Na in normal range and within 180 days    Sodium  Date Value Ref Range Status  11/28/2019 138 135 - 146 mmol/L Final  06/19/2016 135 134 - 144 mmol/L Final         Passed - Cr in normal range and within 180 days    Creat  Date Value Ref Range Status  11/28/2019 1.17 0.70 - 1.33 mg/dL Final    Comment:    For patients >61 years of age, the reference limit for Creatinine is approximately 13% higher for people identified as African-American. .    Creatinine, Urine  Date Value Ref Range Status  07/02/2019 66 20 - 320 mg/dL Final         Passed - Ca in normal range and within 180 days    Calcium  Date Value Ref Range Status  11/28/2019 10.2 8.6 - 10.3 mg/dL Final         Passed - Patient is not pregnant      Passed - Valid encounter within last 6 months    Recent Outpatient Visits          1 month ago Dyslipidemia associated with type 2 diabetes mellitus [facility])   [facility] [name], MD   6 months ago Vitamin D deficiency   [facility], Drue Stager, MD   10 months ago Dyslipidemia associated with type 2 diabetes mellitus [facility])   [facility] [name], MD   1 year ago Type 2 diabetes with complication [facility])   [facility] [name], MD   1 year ago Type 2 diabetes  mellitus with hyperglycemia, without long-term current use of insulin [facility])   [facility] [name], MD      Future Appointments            In 1 month Ancil Boozer, Drue Stager, MD [facility] [place]   In 2 months McGowan, Gordan Payment [place]           . JARDIANCE 25 MG TABS tablet [Pharmacy Med Name: Jardiance 25 MG Oral Tablet] 90 tablet 0    Sig: Take 1 tablet by mouth once daily     Endocrinology:  Diabetes - SGLT2 Inhibitors Failed - 01/24/2020 10:37 AM      Failed - LDL in normal range and within 360 days    LDL Cholesterol (Calc)  Date Value Ref Range Status  11/28/2019 114 (H) mg/dL (calc) Final    Comment:    Reference range: <100 . Desirable range <100 mg/dL for primary prevention;   <70 mg/dL for patients with CHD or diabetic patients  with > or = 2 CHD risk factors. Marland Kitchen LDL-C  is now calculated using the Martin-Hopkins  calculation, which is a validated novel method providing  better accuracy than the Friedewald equation in the  estimation of LDL-C.  Cresenciano Genre et al. Annamaria Helling. 9924;268(34): 2061-2068  (http://education.QuestDiagnostics.com/faq/FAQ164)          Failed - HBA1C is between 0 and 7.9 and within 180 days    HbA1c, POC (prediabetic range)  Date Value Ref Range Status  12/18/2018 7.6 (A) 5.7 - 6.4 % Final   HbA1c, POC (controlled diabetic range)  Date Value Ref Range Status  07/02/2019 7.8 (A) 0.0 - 7.0 % Final   Hgb A1c MFr Bld  Date Value Ref Range Status  11/28/2019 9.4 (H) <5.7 % of total Hgb Final    Comment:    For someone without known diabetes, a hemoglobin A1c value of 6.5% or greater indicates that they may have  diabetes and this should be confirmed with a follow-up  test. . For someone with known diabetes, a value <7% indicates  that their diabetes is well controlled and a value  greater than or equal to 7% indicates suboptimal  control. A1c targets should be  individualized based on  duration of diabetes, age, comorbid conditions, and  other considerations. . Currently, no consensus exists regarding use of hemoglobin A1c for diagnosis of diabetes for children. .          Passed - Cr in normal range and within 360 days    Creat  Date Value Ref Range Status  11/28/2019 1.17 0.70 - 1.33 mg/dL Final    Comment:    For patients >14 years of age, the reference limit for Creatinine is approximately 13% higher for people identified as African-American. .    Creatinine, Urine  Date Value Ref Range Status  07/02/2019 66 20 - 320 mg/dL Final         Passed - eGFR in normal range and within 360 days    GFR, Est African American  Date Value Ref Range Status  11/28/2019 83 > OR = 60 mL/min/1.67m Final   GFR, Est Non African American  Date Value Ref Range Status  11/28/2019 71 > OR = 60 mL/min/1.76mFinal         Passed - Valid encounter within last 6 months    Recent Outpatient Visits          1 month ago Dyslipidemia associated with type 2 diabetes mellitus [facility]   6 months ago Vitamin D deficiency   [facility]   10 months ago Dyslipidemia associated with type 2 diabetes mellitus ([facility]  [facility]   1 year ago Type 2 diabetes with complication [facility]  [facility]   1 year ago Type 2 diabetes mellitus with hyperglycemia, without long-term current use of insulin ([facility]  [facility]      Future Appointments            In 1 month SoAncil BoozerKrDrue StagerMD [facility] In 2 months [facility]

## 2020-02-19 ENCOUNTER — Other Ambulatory Visit: Payer: Self-pay | Admitting: Family Medicine

## 2020-02-19 DIAGNOSIS — I10 Essential (primary) hypertension: Secondary | ICD-10-CM

## 2020-02-19 MED ORDER — AMLODIPINE BESYLATE 10 MG PO TABS: 10.0000 mg | ORAL_TABLET | Freq: Every day | ORAL | 0 refills | Status: DC

## 2020-02-19 NOTE — Telephone Encounter (Signed)
Medication Refill - Medication: amlodipine   Has the patient contacted their pharmacy? Yes.   Pt states the pharmacy will not refill this medication unless it is a 90 day supply. Please advise.  (Agent: If no, request that the patient contact the pharmacy for the refill.) (Agent: If yes, when and what did the pharmacy advise?)  Preferred Pharmacy (with phone number or street name):  [facility] [id number], [place]  [place] [address]  Phone: [phone] Fax: [phone]  Not a 24 hour pharmacy; exact hours not known.     Agent: Please be advised that RX refills may take up to 3 business days. We ask that you follow-up with your pharmacy.

## 2020-02-26 NOTE — Progress Notes (Signed)
Patient ID: [name], male    DOB: September 11, 1966, 54 y.o.   MRN: [id number]  PCP: [name], MD  Chief Complaint  Patient presents with  . Diabetes  . Hyperlipidemia  . Hypertension    Subjective:   [name] is a 54 y.o. male, presents to clinic with CC of the following:  Chief Complaint  Patient presents with  . Diabetes  . Hyperlipidemia  . Hypertension    HPI:  Patient is a 54 year old male patient of Dr. Ancil Boozer, who was last seen 11/25/2019 and follows up today.  Issues being followed include: DMII:  statescompliant of medications-  taking Glipizide XL 10 mg, Metformin 1000 mg BID and Jardiance.  He has refused injectable medications in the past does not check his glucose at home. HgbA1Cwent down to 7.6% on visit prior to last, and last visit was 9.4% (referred to endocrine after the results from the last visit and not seen by endo)  He noted today he did not go to endocrine as "I know what to do for my diabetes "    Last Eye exam - 2-3 weeks ago, had laser on left eye then, retinopathy history  He also has CKI and is seeing Dr. Candiss Norse , nephrologist .  No increased thirst, frequency, numbness or tingling in ext's, does note drinks a lot of water  Lab Results  Component Value Date   HGBA1C 9.4 (H) 11/28/2019   HGBA1C 7.8 (A) 07/02/2019   HGBA1C 9.1 (A) 03/19/2019   Lab Results  Component Value Date   MICROALBUR 59.7 07/02/2019   [name] 114 (H) 11/28/2019   CREATININE 1.17 11/28/2019   Macro albuminuria with secondary hyperparathyroidism: Followed by nephrology and noted had retinopathy in his last note On losartan no pruritis,   HTN: Blood pressures have been high on visits in the past. Not checking at home. Told by nephrologist about a device he could get to check at [facility], has not obtained Started torsemide by nephrology at visit before last visit for BP and leg swelling BP Readings from Last 3 Encounters:  02/27/20 124/84    07/16/19 (!) 168/90  07/02/19 (!) 164/94   BP at nephrology visit 01/14/20- 153/96.  Nephrologist did not increase his medications at that visit. No chest pain or palpitation, no SOB, no LE swelling  Dyslipidemia: heis on statin - Crestor, not take daily denies myalgia, LDL not at goal which is < 70 Lab Results  Component Value Date   CHOL 190 11/28/2019   HDL 46 11/28/2019   LDLCALC 114 (H) 11/28/2019   TRIG 178 (H) 11/28/2019   CHOLHDL 4.1 11/28/2019  states still only taking it about 5 days a week, and noted last visit that he was tired of taking pills. Rec'ed taking daily  Overweight: He notes weights have decreased, intentional Walks every day for an hour Watching diet, avoids fast foods and bisquits/potatoes  Wt Readings from Last 3 Encounters:  02/27/20 207 lb 11.2 oz (94.2 kg)  04/14/19 217 lb (98.4 kg)  02/12/19 221 lb (100.2 kg)    LUTS with BPH and ED -followed by urology, last visit in May 2020 Lab Results  Component Value Date   PSA1 0.5 04/14/2019   PSA1 0.6 02/27/2017  Prescription given for Levitra at his last visit to utilize for ED Follow-up recommended yearly    Tob - never smoker Alcohol - occas, 6 -12 beers per week Exercise - regular  walking  Colonoscopy - not  ever, states had cologuard but not done yet. Rec'ed  Patient Active Problem List   Diagnosis Date Noted  . Secondary renal hyperparathyroidism ([place]) 03/19/2019  . Type 2 diabetes mellitus with left diabetic foot ulcer ([place]) 12/18/2018  . Type 2 diabetes mellitus with chronic kidney disease and hypertension ([place]) 12/18/2018  . Type 2 diabetes mellitus with macroalbuminuric diabetic nephropathy ([place]) 12/18/2018  . Dyslipidemia associated with type 2 diabetes mellitus ([place]) 07/03/2018  . Vitamin D deficiency 04/11/2018  . ED (erectile dysfunction) 04/10/2018  . Lower urinary tract symptoms (LUTS) 02/09/2017  . Hypertension 07/05/2015  . Diabetes mellitus type 2, uncontrolled ([place])  07/05/2015  . HLD (hyperlipidemia) 07/05/2015      Current Outpatient Medications:  .  amLODipine (NORVASC) 10 MG tablet, Take 1 tablet (10 mg total) by mouth daily., Disp: 90 tablet, Rfl: 0 .  aspirin 81 MG tablet, Take by mouth., Disp: , Rfl:  .  glipiZIDE (GLUCOTROL XL) 10 MG 24 hr tablet, Take 1 tablet (10 mg total) by mouth daily with breakfast., Disp: 30 tablet, Rfl: 1 .  glucose blood (BAYER CONTOUR NEXT TEST) test strip, BAYER CONTOUR NEXT TEST (In Vitro Strip)  1 (one) Strip Strip three times daily for 90 days  Quantity: 1;  Refills: 3   Ordered :03-Feb-2013  [name] ;  Started 03-Feb-2013 Active, Disp: , Rfl:  .  JARDIANCE 25 MG TABS tablet, Take 1 tablet by mouth once daily, Disp: 90 tablet, Rfl: 0 .  metFORMIN (GLUCOPHAGE) 1000 MG tablet, Take 1 tablet (1,000 mg total) by mouth 2 (two) times daily with a meal., Disp: 180 tablet, Rfl: 1 .  rosuvastatin (CRESTOR) 40 MG tablet, Take 1 tablet (40 mg total) by mouth every other day. Usually takes M-F, Disp: 60 tablet, Rfl: 1 .  tadalafil (CIALIS) 20 MG tablet, Take 1 tablet up to 2 hours prior to intercourse on an empty stomach, Disp: 30 tablet, Rfl: 3 .  torsemide (DEMADEX) 10 MG tablet, Take by mouth., Disp: , Rfl:  .  valsartan-hydrochlorothiazide (DIOVAN-HCT) 320-25 MG tablet, TAKE 1 TABLET BY MOUTH ONCE DAILY (IN  PLACE  OF  LISINOTPRIL  HCTZ), Disp: 90 tablet, Rfl: 0 .  Vitamin D, Ergocalciferol, (DRISDOL) 1.25 MG (50000 UNIT) CAPS capsule, TAKE 1 CAPSULE BY MOUITH ONCE A WEEK FOR 12 WEEKS., Disp: 12 capsule, Rfl: 0   No Known Allergies   History reviewed. No pertinent surgical history.   Family History  Problem Relation Age of Onset  . Heart disease Father   . Heart attack Father   . Healthy Mother   . Prostate cancer Neg Hx   . Kidney cancer Neg Hx   . Bladder Cancer Neg Hx      Social History   Tobacco Use  . Smoking status: Never Smoker  . Smokeless tobacco: Never Used  Substance Use Topics  . Alcohol  use: Yes    Alcohol/week: 0.0 standard drinks    Comment: Occasional    With staff assistance, above reviewed with the patient today.  ROS: As per HPI, otherwise no specific complaints on a limited and focused system review   No results found for this or any previous visit (from the past 72 hour(s)).   PHQ2/9: Depression screen [facility] 2/9 02/27/2020 11/26/2019 07/02/2019 03/19/2019 12/18/2018  Decreased Interest 0 0 0 0 0  Down, Depressed, Hopeless 0 0 0 0 0  PHQ - 2 Score 0 0 0 0 0  Altered sleeping 0 0 0 0 -  Tired,  decreased energy 0 0 0 0 -  Change in appetite 0 0 0 0 -  Feeling bad or failure about yourself  0 0 0 0 -  Trouble concentrating 0 0 0 0 -  Moving slowly or fidgety/restless 0 0 0 0 -  Suicidal thoughts 0 0 0 0 -  PHQ-9 Score 0 0 0 0 -  Difficult doing work/chores Not difficult at all - Not difficult at all - -   PHQ-2/9 Result is neg  Fall Risk: Fall Risk  02/27/2020 11/26/2019 07/02/2019 03/19/2019 12/18/2018  Falls in the past year? 0 0 0 0 0  Number falls in past yr: 0 0 0 0 0  Injury with Fall? 0 0 0 0 0      Objective:   Vitals:   02/27/20 1132  BP: 124/84  Pulse: 77  Resp: 16  Temp: 97.9 F (36.6 C)  TempSrc: Temporal  SpO2: 98%  Weight: 207 lb 11.2 oz (94.2 kg)  Height: 5\' 11"  (1.803 m)    Body mass index is 28.97 kg/m.  Physical Exam   NAD, masked HEENT - [place]/AT, sclera anicteric, PERRL, EOMI, conj - non-inj'ed, pharynx clear Neck - supple, no adenopathy, no TM, carotids 2+ and = without bruits bilat Car - RRR without m/g/r Pulm- RR and effort normal at rest, CTA without wheeze or rales Abd - soft, NT diffusely Back - no CVA tenderness Ext - no LE edema,  Neuro/psychiatric - affect was not flat, appropriate with conversation  Alert and oriented  Grossly non-focal   Speech normal   Results for orders placed or performed in visit on 11/26/19  COMPLETE METABOLIC PANEL WITH GFR  Result Value Ref Range   Glucose, Bld 260 (H) 65 - 99 mg/dL    BUN 29 (H) 7 - 25 mg/dL   Creat 1.17 0.70 - 1.33 mg/dL   GFR, Est Non African American 71 > OR = 60 mL/min/1.61m2   GFR, Est African American 83 > OR = 60 mL/min/1.27m2   BUN/Creatinine Ratio 25 (H) 6 - 22 (calc)   Sodium 138 135 - 146 mmol/L   Potassium 4.4 3.5 - 5.3 mmol/L   Chloride 98 98 - 110 mmol/L   CO2 28 20 - 32 mmol/L   Calcium 10.2 8.6 - 10.3 mg/dL   Total Protein 7.2 6.1 - 8.1 g/dL   Albumin 4.1 3.6 - 5.1 g/dL   Globulin 3.1 1.9 - 3.7 g/dL (calc)   AG Ratio 1.3 1.0 - 2.5 (calc)   Total Bilirubin 0.9 0.2 - 1.2 mg/dL   Alkaline phosphatase (APISO) 73 35 - 144 U/L   AST 14 10 - 35 U/L   ALT 17 9 - 46 U/L  Hemoglobin A1c  Result Value Ref Range   Hgb A1c MFr Bld 9.4 (H) <5.7 % of total Hgb   Mean Plasma Glucose 223 (calc)   eAG (mmol/L) 12.4 (calc)  Lipid panel  Result Value Ref Range   Cholesterol 190 <200 mg/dL   HDL 46 > OR = 40 mg/dL   Triglycerides 178 (H) <150 mg/dL   LDL Cholesterol (Calc) 114 (H) mg/dL (calc)   Total CHOL/HDL Ratio 4.1 <5.0 (calc)   Non-HDL Cholesterol (Calc) 144 (H) <130 mg/dL (calc)     A1C - 9.7 today Assessment & Plan:  1. Type 2 diabetes mellitus with chronic kidney disease and hypertension ([place]) Has not been well controlled in the recent past.  He was referred to endocrine, but did not go, stating  he knows what he needs to do to get his blood sugars better controlled.  Has increased his exercise more.  States is trying to do better with his diet. Remains on his medication regimen above. Does not check his blood sugars at home presently. We will recheck an A1c today.  Not feel the need to recheck other labs with the last done in December. - POCT glycosylated hemoglobin (Hb A1C) The A1c was 9.7 today.  Discussed with patient the need to get this better controlled, and the importance for protecting his kidneys, heart, and for his overall health.  The really was not interested in adding any new medicines as we discussed, continues to refuse  any injectables, and states he had it down in the sevens before and thinks he can get it back to that range with what he is doing.  I did explain the natural history of diabetes and the expected progression that usually occurs, and he did note if it persists in this range over time, we would consider adding a medicine at that point He did not go to the endocrine referral that was placed previously   2. Essential hypertension Blood pressures have not been well controlled in the recent past, and it was recommended he get a machine to help check them at home, although he has not pursued that.  His last visit with nephrology had a slightly higher blood pressure, with better today.  I also encouraged him to to try to check blood pressures at home. He noted that since being on the torsemide product, started by the nephrologist, his blood pressures have been better. Hold on any medication adjustment presently for his blood pressure.   3. Dyslipidemia associated with type 2 diabetes mellitus ([place]) His LDL is not at goal, which is less than 70, and did note that to him. Recommended he take the Crestor product daily and not just 5 days a week.  I am not sure he will be compliant with that. Did discuss dietary recommendations which she has improved on recently.  Also the regular exercise will be helpful.  Encouraged to continue.  4. Secondary renal hyperparathyroidism ([place]) Followed by nephrology.  5. Lower urinary tract symptoms (LUTS) Followed by urology.  6. Overweight (BMI 25.0-29.9) His weight has decreased, and that is intentional.  Encouraged to continue with the exercise, and the dietary modifications.  Discussed colon cancer screening with him today, and recommended him completing the Cologuard as he chose that over a colonoscopy as a screening option.        Towanda Malkin, MD 02/27/20 11:46 AM

## 2020-02-27 ENCOUNTER — Ambulatory Visit: Payer: Managed Care, Other (non HMO) | Admitting: Internal Medicine

## 2020-02-27 ENCOUNTER — Encounter: Payer: Self-pay | Admitting: Internal Medicine

## 2020-02-27 ENCOUNTER — Other Ambulatory Visit: Payer: Self-pay

## 2020-02-27 VITALS — BP 124/84 | HR 77 | Temp 97.9°F | Resp 16 | Ht 71.0 in | Wt 207.7 lb

## 2020-02-27 DIAGNOSIS — I10 Essential (primary) hypertension: Secondary | ICD-10-CM | POA: Diagnosis not present

## 2020-02-27 DIAGNOSIS — E1122 Type 2 diabetes mellitus with diabetic chronic kidney disease: Secondary | ICD-10-CM | POA: Diagnosis not present

## 2020-02-27 DIAGNOSIS — I129 Hypertensive chronic kidney disease with stage 1 through stage 4 chronic kidney disease, or unspecified chronic kidney disease: Secondary | ICD-10-CM

## 2020-02-27 DIAGNOSIS — Z1211 Encounter for screening for malignant neoplasm of colon: Secondary | ICD-10-CM

## 2020-02-27 DIAGNOSIS — R399 Unspecified symptoms and signs involving the genitourinary system: Secondary | ICD-10-CM

## 2020-02-27 DIAGNOSIS — E785 Hyperlipidemia, unspecified: Secondary | ICD-10-CM

## 2020-02-27 DIAGNOSIS — E1169 Type 2 diabetes mellitus with other specified complication: Secondary | ICD-10-CM

## 2020-02-27 DIAGNOSIS — N2581 Secondary hyperparathyroidism of renal origin: Secondary | ICD-10-CM

## 2020-02-27 DIAGNOSIS — Z1212 Encounter for screening for malignant neoplasm of rectum: Secondary | ICD-10-CM

## 2020-02-27 DIAGNOSIS — E663 Overweight: Secondary | ICD-10-CM

## 2020-02-27 DIAGNOSIS — IMO0001 Reserved for inherently not codable concepts without codable children: Secondary | ICD-10-CM

## 2020-02-27 LAB — POCT GLYCOSYLATED HEMOGLOBIN (HGB A1C): Hemoglobin A1C: 9.7 % — AB (ref 4.0–5.6)

## 2020-03-10 ENCOUNTER — Other Ambulatory Visit: Payer: Self-pay | Admitting: Family Medicine

## 2020-03-10 DIAGNOSIS — E669 Obesity, unspecified: Secondary | ICD-10-CM

## 2020-03-10 DIAGNOSIS — E118 Type 2 diabetes mellitus with unspecified complications: Secondary | ICD-10-CM

## 2020-03-10 DIAGNOSIS — E1169 Type 2 diabetes mellitus with other specified complication: Secondary | ICD-10-CM

## 2020-03-10 DIAGNOSIS — E559 Vitamin D deficiency, unspecified: Secondary | ICD-10-CM

## 2020-03-10 MED ORDER — VITAMIN D (ERGOCALCIFEROL) 1.25 MG (50000 UNIT) PO CAPS: ORAL_CAPSULE | ORAL | 0 refills | Status: DC

## 2020-03-10 MED ORDER — GLIPIZIDE ER 10 MG PO TB24: 10.0000 mg | ORAL_TABLET | Freq: Every day | ORAL | 1 refills | Status: DC

## 2020-03-10 NOTE — Telephone Encounter (Signed)
Copied from [place] [id number]. Topic: Quick Communication - Rx Refill/Question >> Mar 10, 2020  9:09 AM Rainey Pines A wrote: Medication: glipiZIDE (GLUCOTROL XL) 10 MG 24 hr tablet ,Vitamin D, Ergocalciferol, (DRISDOL) 1.25 MG (50000 UNIT) CAPS capsule , (Patient is out of 2 medications and also would like all medications transferred to [facility] from [place]. )  Has the patient contacted their pharmacy? Yes (Agent: If no, request that the patient contact the pharmacy for the refill.) (Agent: If yes, when and what did the pharmacy advise?)Contact   Preferred Pharmacy (with phone number or street name):[facility] [id number], [place] [address] [place]  Phone:  [phone] Fax:  [phone]     Agent: Please be advised that RX refills may take up to 3 business days. We ask that you follow-up with your pharmacy.

## 2020-04-07 ENCOUNTER — Other Ambulatory Visit: Payer: BLUE CROSS/BLUE SHIELD

## 2020-04-07 ENCOUNTER — Other Ambulatory Visit: Payer: Self-pay

## 2020-04-07 DIAGNOSIS — N138 Other obstructive and reflux uropathy: Secondary | ICD-10-CM

## 2020-04-08 ENCOUNTER — Other Ambulatory Visit: Payer: Managed Care, Other (non HMO)

## 2020-04-14 ENCOUNTER — Ambulatory Visit: Payer: BLUE CROSS/BLUE SHIELD | Admitting: Urology

## 2020-04-21 ENCOUNTER — Other Ambulatory Visit: Payer: Self-pay | Admitting: Family Medicine

## 2020-04-21 DIAGNOSIS — E1169 Type 2 diabetes mellitus with other specified complication: Secondary | ICD-10-CM

## 2020-04-21 DIAGNOSIS — I10 Essential (primary) hypertension: Secondary | ICD-10-CM

## 2020-04-21 DIAGNOSIS — E118 Type 2 diabetes mellitus with unspecified complications: Secondary | ICD-10-CM

## 2020-05-16 ENCOUNTER — Other Ambulatory Visit: Payer: Self-pay | Admitting: Family Medicine

## 2020-05-16 DIAGNOSIS — I10 Essential (primary) hypertension: Secondary | ICD-10-CM

## 2020-05-16 NOTE — Telephone Encounter (Signed)
Requested Prescriptions  Pending Prescriptions Disp Refills   amLODipine (NORVASC) 10 MG tablet [Pharmacy Med Name: AMLODIPINE BESYLATE 10MG  TABLETS] 90 tablet 1    Sig: TAKE 1 TABLET(10 MG) BY MOUTH DAILY     Cardiovascular:  Calcium Channel Blockers Passed - 05/16/2020 12:15 PM      Passed - Last BP in normal range    BP Readings from Last 1 Encounters:  02/27/20 124/84         Passed - Valid encounter within last 6 months    Recent Outpatient Visits          2 months ago Type 2 diabetes mellitus with chronic kidney disease and hypertension [facility])   [facility] Lebron Conners D, MD   5 months ago Dyslipidemia associated with type 2 diabetes mellitus [facility])   [facility] [name], MD   10 months ago Vitamin D deficiency   [facility], Drue Stager, MD   1 year ago Dyslipidemia associated with type 2 diabetes mellitus [facility])   [facility] [name], MD   1 year ago Type 2 diabetes with complication [facility])   [facility] [name], MD      Future Appointments            In 1 week [name], MD [facility] [place]   In 2 months McGowan, Gordan Payment [facility]

## 2020-05-28 ENCOUNTER — Ambulatory Visit: Payer: Managed Care, Other (non HMO) | Admitting: Family Medicine

## 2020-06-05 ENCOUNTER — Other Ambulatory Visit: Payer: Self-pay | Admitting: Family Medicine

## 2020-06-05 DIAGNOSIS — E559 Vitamin D deficiency, unspecified: Secondary | ICD-10-CM

## 2020-06-05 NOTE — Telephone Encounter (Signed)
Requested medication (s) are due for refill today: yes  Requested medication (s) are on the active medication list: yes  Last refill:  03/11/19  Future visit scheduled: no  Notes to clinic:  Medication not delegated to NT to RF   Requested Prescriptions  Pending Prescriptions Disp Refills   Vitamin D, Ergocalciferol, (DRISDOL) 1.25 MG (50000 UNIT) CAPS capsule [Pharmacy Med Name: VITAMIN D2 50,000IU (ERGO) CAP RX] 12 capsule 0    Sig: TAKE ONE CAPSULE BY MOUTH ONCE A WEEK FOR 12 WEEKS      Endocrinology:  Vitamins - Vitamin D Supplementation Failed - 06/05/2020 11:14 AM      Failed - 50,000 IU strengths are not delegated      Failed - Phosphate in normal range and within 360 days    No results found for: PHOS        Passed - Ca in normal range and within 360 days    Calcium  Date Value Ref Range Status  11/28/2019 10.2 8.6 - 10.3 mg/dL Final          Passed - Vitamin D in normal range and within 360 days    Vit D, 25-Hydroxy  Date Value Ref Range Status  07/02/2019 32 30 - 100 ng/mL Final    Comment:    Vitamin D Status         25-OH Vitamin D: . Deficiency:                    <20 ng/mL Insufficiency:             20 - 29 ng/mL Optimal:                 > or = 30 ng/mL . For 25-OH Vitamin D testing on patients on  D2-supplementation and patients for whom quantitation  of D2 and D3 fractions is required, the QuestAssureD(TM) 25-OH VIT D, (D2,D3), LC/MS/MS is recommended: order  code [id number] (patients >51yrs). See Note 1 . Note 1 . For additional information, please refer to  http://education.QuestDiagnostics.com/faq/FAQ199  (This link is being provided for informational/ educational purposes only.)           Passed - Valid encounter within last 12 months    Recent Outpatient Visits           3 months ago Type 2 diabetes mellitus with chronic kidney disease and hypertension [facility])   [facility] Lebron Conners D, MD   6 months ago  Dyslipidemia associated with type 2 diabetes mellitus [facility])   [facility] [name], MD   11 months ago Vitamin D deficiency   [facility] [place], Drue Stager, MD   1 year ago Dyslipidemia associated with type 2 diabetes mellitus [facility])   [facility] [name], MD   1 year ago Type 2 diabetes with complication [facility])   [facility] [name], MD       Future Appointments             In 1 month McGowan, Gordan Payment [facility]

## 2020-06-23 ENCOUNTER — Other Ambulatory Visit: Payer: Self-pay | Admitting: Family Medicine

## 2020-06-23 DIAGNOSIS — E1169 Type 2 diabetes mellitus with other specified complication: Secondary | ICD-10-CM

## 2020-06-25 ENCOUNTER — Inpatient Hospital Stay
Admission: EM | Admit: 2020-06-25 | Discharge: 2020-07-01 | DRG: 617 | Disposition: A | Discharge status: Home / Self Care | Payer: Managed Care, Other (non HMO) | Attending: Family Medicine | Admitting: Family Medicine

## 2020-06-25 ENCOUNTER — Other Ambulatory Visit: Payer: Self-pay

## 2020-06-25 ENCOUNTER — Emergency Department: Payer: Managed Care, Other (non HMO)

## 2020-06-25 ENCOUNTER — Inpatient Hospital Stay: Payer: Managed Care, Other (non HMO)

## 2020-06-25 DIAGNOSIS — E1152 Type 2 diabetes mellitus with diabetic peripheral angiopathy with gangrene: Secondary | ICD-10-CM | POA: Diagnosis present

## 2020-06-25 DIAGNOSIS — M869 Osteomyelitis, unspecified: Secondary | ICD-10-CM | POA: Diagnosis not present

## 2020-06-25 DIAGNOSIS — Z8249 Family history of ischemic heart disease and other diseases of the circulatory system: Secondary | ICD-10-CM

## 2020-06-25 DIAGNOSIS — L97529 Non-pressure chronic ulcer of other part of left foot with unspecified severity: Secondary | ICD-10-CM | POA: Diagnosis present

## 2020-06-25 DIAGNOSIS — N170 Acute kidney failure with tubular necrosis: Secondary | ICD-10-CM | POA: Diagnosis present

## 2020-06-25 DIAGNOSIS — Z7982 Long term (current) use of aspirin: Secondary | ICD-10-CM

## 2020-06-25 DIAGNOSIS — E785 Hyperlipidemia, unspecified: Secondary | ICD-10-CM | POA: Diagnosis present

## 2020-06-25 DIAGNOSIS — E08621 Diabetes mellitus due to underlying condition with foot ulcer: Secondary | ICD-10-CM | POA: Diagnosis not present

## 2020-06-25 DIAGNOSIS — E1142 Type 2 diabetes mellitus with diabetic polyneuropathy: Secondary | ICD-10-CM | POA: Diagnosis present

## 2020-06-25 DIAGNOSIS — I70262 Atherosclerosis of native arteries of extremities with gangrene, left leg: Secondary | ICD-10-CM | POA: Diagnosis not present

## 2020-06-25 DIAGNOSIS — L02612 Cutaneous abscess of left foot: Secondary | ICD-10-CM | POA: Diagnosis present

## 2020-06-25 DIAGNOSIS — E7800 Pure hypercholesterolemia, unspecified: Secondary | ICD-10-CM | POA: Diagnosis present

## 2020-06-25 DIAGNOSIS — Z794 Long term (current) use of insulin: Secondary | ICD-10-CM | POA: Diagnosis not present

## 2020-06-25 DIAGNOSIS — E559 Vitamin D deficiency, unspecified: Secondary | ICD-10-CM | POA: Diagnosis present

## 2020-06-25 DIAGNOSIS — L089 Local infection of the skin and subcutaneous tissue, unspecified: Secondary | ICD-10-CM | POA: Diagnosis not present

## 2020-06-25 DIAGNOSIS — I160 Hypertensive urgency: Secondary | ICD-10-CM | POA: Diagnosis present

## 2020-06-25 DIAGNOSIS — Z79899 Other long term (current) drug therapy: Secondary | ICD-10-CM | POA: Diagnosis not present

## 2020-06-25 DIAGNOSIS — Z09 Encounter for follow-up examination after completed treatment for conditions other than malignant neoplasm: Secondary | ICD-10-CM

## 2020-06-25 DIAGNOSIS — E876 Hypokalemia: Secondary | ICD-10-CM | POA: Diagnosis not present

## 2020-06-25 DIAGNOSIS — D649 Anemia, unspecified: Secondary | ICD-10-CM | POA: Diagnosis present

## 2020-06-25 DIAGNOSIS — L97426 Non-pressure chronic ulcer of left heel and midfoot with bone involvement without evidence of necrosis: Secondary | ICD-10-CM | POA: Diagnosis not present

## 2020-06-25 DIAGNOSIS — I10 Essential (primary) hypertension: Secondary | ICD-10-CM | POA: Diagnosis present

## 2020-06-25 DIAGNOSIS — L97404 Non-pressure chronic ulcer of unspecified heel and midfoot with necrosis of bone: Secondary | ICD-10-CM | POA: Diagnosis not present

## 2020-06-25 DIAGNOSIS — E1169 Type 2 diabetes mellitus with other specified complication: Principal | ICD-10-CM

## 2020-06-25 DIAGNOSIS — E11621 Type 2 diabetes mellitus with foot ulcer: Secondary | ICD-10-CM | POA: Diagnosis present

## 2020-06-25 DIAGNOSIS — L97424 Non-pressure chronic ulcer of left heel and midfoot with necrosis of bone: Secondary | ICD-10-CM | POA: Diagnosis not present

## 2020-06-25 DIAGNOSIS — L97422 Non-pressure chronic ulcer of left heel and midfoot with fat layer exposed: Secondary | ICD-10-CM | POA: Diagnosis not present

## 2020-06-25 DIAGNOSIS — L97509 Non-pressure chronic ulcer of other part of unspecified foot with unspecified severity: Secondary | ICD-10-CM | POA: Diagnosis not present

## 2020-06-25 DIAGNOSIS — N529 Male erectile dysfunction, unspecified: Secondary | ICD-10-CM | POA: Diagnosis present

## 2020-06-25 DIAGNOSIS — E11628 Type 2 diabetes mellitus with other skin complications: Secondary | ICD-10-CM | POA: Insufficient documentation

## 2020-06-25 DIAGNOSIS — E1165 Type 2 diabetes mellitus with hyperglycemia: Secondary | ICD-10-CM | POA: Diagnosis not present

## 2020-06-25 DIAGNOSIS — L03116 Cellulitis of left lower limb: Secondary | ICD-10-CM | POA: Diagnosis present

## 2020-06-25 LAB — CBC WITH DIFFERENTIAL/PLATELET
Abs Immature Granulocytes: 0.06 10*3/uL (ref 0.00–0.07)
Basophils Absolute: 0.1 10*3/uL (ref 0.0–0.1)
Basophils Relative: 0 %
Eosinophils Absolute: 0 10*3/uL (ref 0.0–0.5)
Eosinophils Relative: 0 %
HCT: 35.4 % — ABNORMAL LOW (ref 39.0–52.0)
Hemoglobin: 12.4 g/dL — ABNORMAL LOW (ref 13.0–17.0)
Immature Granulocytes: 0 %
Lymphocytes Relative: 6 %
Lymphs Abs: 0.8 10*3/uL (ref 0.7–4.0)
MCH: 31.4 pg (ref 26.0–34.0)
MCHC: 35 g/dL (ref 30.0–36.0)
MCV: 89.6 fL (ref 80.0–100.0)
Monocytes Absolute: 1.5 10*3/uL — ABNORMAL HIGH (ref 0.1–1.0)
Monocytes Relative: 11 %
Neutro Abs: 11 10*3/uL — ABNORMAL HIGH (ref 1.7–7.7)
Neutrophils Relative %: 83 %
Platelets: 250 10*3/uL (ref 150–400)
RBC: 3.95 MIL/uL — ABNORMAL LOW (ref 4.22–5.81)
RDW: 11.7 % (ref 11.5–15.5)
WBC: 13.4 10*3/uL — ABNORMAL HIGH (ref 4.0–10.5)
nRBC: 0 % (ref 0.0–0.2)

## 2020-06-25 LAB — COMPREHENSIVE METABOLIC PANEL
ALT: 16 U/L (ref 0–44)
AST: 14 U/L — ABNORMAL LOW (ref 15–41)
Albumin: 3.6 g/dL (ref 3.5–5.0)
Alkaline Phosphatase: 62 U/L (ref 38–126)
Anion gap: 16 — ABNORMAL HIGH (ref 5–15)
BUN: 37 mg/dL — ABNORMAL HIGH (ref 6–20)
CO2: 20 mmol/L — ABNORMAL LOW (ref 22–32)
Calcium: 9.2 mg/dL (ref 8.9–10.3)
Chloride: 98 mmol/L (ref 98–111)
Creatinine, Ser: 1.37 mg/dL — ABNORMAL HIGH (ref 0.61–1.24)
GFR calc Af Amer: >60 mL/min (ref >60)
GFR calc non Af Amer: 58 mL/min — ABNORMAL LOW (ref >60)
Glucose, Bld: 187 mg/dL — ABNORMAL HIGH (ref 70–99)
Potassium: 3.5 mmol/L (ref 3.5–5.1)
Sodium: 134 mmol/L — ABNORMAL LOW (ref 135–145)
Total Bilirubin: 2.5 mg/dL — ABNORMAL HIGH (ref 0.3–1.2)
Total Protein: 8 g/dL (ref 6.5–8.1)

## 2020-06-25 LAB — GLUCOSE, CAPILLARY: Glucose-Capillary: 92 mg/dL (ref 70–99)

## 2020-06-25 MED ORDER — PIPERACILLIN-TAZOBACTAM 4.5 G IVPB: 4.5000 g | Freq: Once | INTRAVENOUS | Status: DC

## 2020-06-25 MED ORDER — PIPERACILLIN-TAZOBACTAM 3.375 G IVPB
3.3750 g | Freq: Three times a day (TID) | INTRAVENOUS | Status: DC
  Administered 2020-06-26 – 2020-06-29 (×10): 3.375 g via INTRAVENOUS
  Filled 2020-06-25 (×16): qty 50

## 2020-06-25 MED ORDER — VANCOMYCIN HCL 1250 MG/250ML IV SOLN
1250.0000 mg | Freq: Two times a day (BID) | INTRAVENOUS | Status: DC
  Administered 2020-06-26: 1250 mg via INTRAVENOUS
  Filled 2020-06-25 (×2): qty 250

## 2020-06-25 MED ORDER — ONDANSETRON HCL 4 MG PO TABS: 4.0000 mg | ORAL_TABLET | Freq: Four times a day (QID) | ORAL | Status: DC | PRN

## 2020-06-25 MED ORDER — ASPIRIN EC 81 MG PO TBEC
81.0000 mg | DELAYED_RELEASE_TABLET | Freq: Every day | ORAL | Status: DC
  Administered 2020-06-27 – 2020-07-01 (×5): 81 mg via ORAL
  Filled 2020-06-25 (×6): qty 1

## 2020-06-25 MED ORDER — INSULIN ASPART 100 UNIT/ML ~~LOC~~ SOLN
0.0000 [IU] | Freq: Every day | SUBCUTANEOUS | Status: DC
  Administered 2020-06-26: 2 [IU] via SUBCUTANEOUS
  Administered 2020-06-27: 4 [IU] via SUBCUTANEOUS
  Filled 2020-06-25 (×3): qty 1

## 2020-06-25 MED ORDER — SODIUM CHLORIDE 0.9 % IV BOLUS
1000.0000 mL | Freq: Once | INTRAVENOUS | Status: AC
  Administered 2020-06-25: 1000 mL via INTRAVENOUS

## 2020-06-25 MED ORDER — AMLODIPINE BESYLATE 10 MG PO TABS
10.0000 mg | ORAL_TABLET | Freq: Every day | ORAL | Status: DC
  Administered 2020-06-26 – 2020-07-01 (×6): 10 mg via ORAL
  Filled 2020-06-25 (×5): qty 1
  Filled 2020-06-25: qty 2

## 2020-06-25 MED ORDER — HYDROCODONE-ACETAMINOPHEN 5-325 MG PO TABS
1.0000 | ORAL_TABLET | Freq: Four times a day (QID) | ORAL | Status: DC | PRN
  Administered 2020-06-28: 1 via ORAL
  Filled 2020-06-25: qty 1

## 2020-06-25 MED ORDER — ONDANSETRON HCL 4 MG/2ML IJ SOLN: 4.0000 mg | Freq: Four times a day (QID) | INTRAMUSCULAR | Status: DC | PRN

## 2020-06-25 MED ORDER — PIPERACILLIN-TAZOBACTAM IN DEX 2-0.25 GM/50ML IV SOLN
2.2500 g | INTRAVENOUS | Status: AC
  Administered 2020-06-25 (×2): 2.25 g via INTRAVENOUS
  Filled 2020-06-25 (×2): qty 50

## 2020-06-25 MED ORDER — INSULIN ASPART 100 UNIT/ML ~~LOC~~ SOLN
4.0000 [IU] | Freq: Once | SUBCUTANEOUS | Status: AC
  Administered 2020-06-25: 4 [IU] via INTRAVENOUS
  Filled 2020-06-25: qty 1

## 2020-06-25 MED ORDER — ROSUVASTATIN CALCIUM 20 MG PO TABS
40.0000 mg | ORAL_TABLET | Freq: Every day | ORAL | Status: DC
  Administered 2020-06-26 – 2020-07-01 (×6): 40 mg via ORAL
  Filled 2020-06-25 (×6): qty 2

## 2020-06-25 MED ORDER — VALSARTAN-HYDROCHLOROTHIAZIDE 320-25 MG PO TABS: 1.0000 | ORAL_TABLET | Freq: Every day | ORAL | Status: DC

## 2020-06-25 MED ORDER — ACETAMINOPHEN 325 MG PO TABS: 650.0000 mg | ORAL_TABLET | Freq: Four times a day (QID) | ORAL | Status: DC | PRN

## 2020-06-25 MED ORDER — ACETAMINOPHEN 650 MG RE SUPP: 650.0000 mg | Freq: Four times a day (QID) | RECTAL | Status: DC | PRN

## 2020-06-25 MED ORDER — INSULIN ASPART 100 UNIT/ML ~~LOC~~ SOLN
0.0000 [IU] | Freq: Three times a day (TID) | SUBCUTANEOUS | Status: DC
  Administered 2020-06-26: 3 [IU] via SUBCUTANEOUS
  Administered 2020-06-26: 2 [IU] via SUBCUTANEOUS
  Administered 2020-06-27: 1 [IU] via SUBCUTANEOUS
  Administered 2020-06-27 – 2020-06-28 (×2): 3 [IU] via SUBCUTANEOUS
  Filled 2020-06-25 (×5): qty 1

## 2020-06-25 MED ORDER — VANCOMYCIN HCL 2000 MG/400ML IV SOLN
2000.0000 mg | Freq: Once | INTRAVENOUS | Status: AC
  Administered 2020-06-25: 2000 mg via INTRAVENOUS
  Filled 2020-06-25: qty 400

## 2020-06-25 MED ORDER — ENOXAPARIN SODIUM 40 MG/0.4ML ~~LOC~~ SOLN
40.0000 mg | SUBCUTANEOUS | Status: DC
  Administered 2020-06-25 – 2020-06-30 (×6): 40 mg via SUBCUTANEOUS
  Filled 2020-06-25 (×6): qty 0.4

## 2020-06-25 NOTE — ED Notes (Signed)
Pt on phone with mri tech

## 2020-06-25 NOTE — ED Provider Notes (Signed)
[facility] Emergency Department Provider Note  ____________________________________________   First MD Initiated Contact with Patient 06/25/20 1626     (approximate)  I have reviewed the triage vital signs and the nursing notes.   HISTORY  Chief Complaint Foot Pain and Leg Pain   HPI [name] is a 54 y.o. male presents to the ED with an ulcer to his left foot.  Patient states that the ulcer has been there for several weeks and that he has been soaking his foot in [place] salt and peeling away dead skin.  Patient states that foot has become very painful.  Patient is diabetic and does not check his glucose on a regular basis.  Currently he is taking oral medication for his diabetes type 2.  He denies any fever, chills, nausea or vomiting.  Rates his pain as 7 out of 10.       Past Medical History:  Diagnosis Date  . Diabetes mellitus without complication ([place])   . Hyperlipidemia   . Hypertension     Patient Active Problem List   Diagnosis Date Noted  . Diabetic foot ulcer ([place]) 06/25/2020  . Secondary renal hyperparathyroidism ([place]) 03/19/2019  . Type 2 diabetes mellitus with left diabetic foot ulcer ([place]) 12/18/2018  . Type 2 diabetes mellitus with chronic kidney disease and hypertension ([place]) 12/18/2018  . Type 2 diabetes mellitus with macroalbuminuric diabetic nephropathy ([place]) 12/18/2018  . Dyslipidemia associated with type 2 diabetes mellitus ([place]) 07/03/2018  . Vitamin D deficiency 04/11/2018  . ED (erectile dysfunction) 04/10/2018  . Lower urinary tract symptoms (LUTS) 02/09/2017  . Hypertension 07/05/2015  . Diabetes mellitus type 2, uncontrolled ([place]) 07/05/2015  . HLD (hyperlipidemia) 07/05/2015    History reviewed. No pertinent surgical history.  Prior to Admission medications   Medication Sig Start Date End Date Taking? Authorizing Provider  amLODipine (NORVASC) 10 MG tablet TAKE 1 TABLET(10 MG) BY MOUTH DAILY 05/16/20   [name], MD  aspirin 81 MG tablet Take by mouth.    [provider]  glipiZIDE (GLUCOTROL XL) 10 MG 24 hr tablet Take 1 tablet (10 mg total) by mouth daily with breakfast. 03/10/20   Ancil Boozer, Drue Stager, MD  glucose blood (BAYER CONTOUR NEXT TEST) test strip BAYER CONTOUR NEXT TEST (In Vitro Strip)  1 (one) Strip Strip three times daily for 90 days  Quantity: 1;  Refills: 3   Ordered :03-Feb-2013  [name] ;  Started 03-Feb-2013 Active 02/03/13   [provider]  JARDIANCE 25 MG TABS tablet TAKE 1 TABLET BY MOUTH ONCE DAILY 04/21/20   [name], MD  metFORMIN (GLUCOPHAGE) 1000 MG tablet Take 1 tablet (1,000 mg total) by mouth 2 (two) times daily with a meal. 07/02/19   Sowles, Drue Stager, MD  rosuvastatin (CRESTOR) 40 MG tablet TAKE 1 TABLET BY MOUTH EVERY OTHER DAY. USUALLY TAKES MONDAY-FRIDAY 06/23/20   [name], MD  tadalafil (CIALIS) 20 MG tablet Take 1 tablet up to 2 hours prior to intercourse on an empty stomach 06/03/19   McGowan, Larene Beach A, PA-C  torsemide (DEMADEX) 10 MG tablet Take by mouth. 10/15/19   [provider]  valsartan-hydrochlorothiazide (DIOVAN-HCT) 320-25 MG tablet TAKE 1 TABLET BY MOUTH EVERY DAY( IN PLACE OF LISINOPRIL/HCTZ) 04/21/20   [name], MD  Vitamin D, Ergocalciferol, (DRISDOL) 1.25 MG (50000 UNIT) CAPS capsule TAKE ONE CAPSULE BY MOUTH ONCE A WEEK FOR 12 WEEKS 06/07/20   [name], MD    Allergies Patient has no known  allergies.  Family History  Problem Relation Age of Onset  . Heart disease Father   . Heart attack Father   . Healthy Mother   . Prostate cancer Neg Hx   . Kidney cancer Neg Hx   . Bladder Cancer Neg Hx     Social History Social History   Tobacco Use  . Smoking status: Never Smoker  . Smokeless tobacco: Never Used  Vaping Use  . Vaping Use: Never used  Substance Use Topics  . Alcohol use: Yes    Alcohol/week: 0.0 standard drinks    Comment: Occasional  . Drug use: No    Review of  Systems Constitutional: No fever/chills Cardiovascular: Denies chest pain. Respiratory: Denies shortness of breath. Gastrointestinal: No abdominal pain.  No nausea, no vomiting.   Musculoskeletal: Left foot and leg pain. Skin: Open wound left foot. Neurological: Negative for headaches, focal weakness or numbness.  ____________________________________________   PHYSICAL EXAM:  VITAL SIGNS: ED Triage Vitals  Enc Vitals Group     BP 06/25/20 1604 (!) 150/83     Pulse Rate 06/25/20 1604 86     Resp 06/25/20 1604 18     Temp 06/25/20 1604 98 F (36.7 C)     Temp src --      SpO2 06/25/20 1604 98 %     Weight 06/25/20 1629 208 lb 5.4 oz (94.5 kg)     Height 06/25/20 1629 5\' 11"  (1.803 m)     Head Circumference --      Peak Flow --      Pain Score 06/25/20 1604 7     Pain Loc --      Pain Edu? --      Excl. in [place]? --     Constitutional: Alert and oriented. Well appearing and in no acute distress.  Strong odor of Pseudomonas when entering the room. Eyes: Conjunctivae are normal.  Head: Atraumatic. Neck: No stridor. Cardiovascular: Normal rate, regular rhythm. Grossly normal heart sounds.  Good peripheral circulation. Respiratory: Normal respiratory effort.  No retractions. Lungs CTAB. Gastrointestinal: Soft and nontender. No distention.  Musculoskeletal: On examination of the left foot there is a single open wound to a callus area lateral plantar aspect.  No active drainage at this time.  Area is extremely tender to touch and also there is some extending cellulitis just below the lateral malleolus.  Area is erythematous and warm to touch.  Motor sensory function intact. Neurologic:  Normal speech and language. No gross focal neurologic deficits are appreciated.  Skin:  Skin is warm, dry.  Open wound as noted above. Psychiatric: Mood and affect are normal. Speech and behavior are normal.  ____________________________________________   LABS (all labs ordered are listed, but  only abnormal results are displayed)  Labs Reviewed  CBC WITH DIFFERENTIAL/PLATELET - Abnormal; Notable for the following components:      Result Value   WBC 13.4 (*)    RBC 3.95 (*)    Hemoglobin 12.4 (*)    HCT 35.4 (*)    Neutro Abs 11.0 (*)    Monocytes Absolute 1.5 (*)    All other components within normal limits  COMPREHENSIVE METABOLIC PANEL - Abnormal; Notable for the following components:   Sodium 134 (*)    CO2 20 (*)    Glucose, Bld 187 (*)    BUN 37 (*)    Creatinine, Ser 1.37 (*)    AST 14 (*)    Total Bilirubin 2.5 (*)    GFR  calc non Af Amer 58 (*)    Anion gap 16 (*)    All other components within normal limits  AEROBIC/ANAEROBIC CULTURE (SURGICAL/DEEP WOUND)  SARS CORONAVIRUS 2 BY RT PCR (HOSPITAL ORDER, [place] [facility])    RADIOLOGY   Official radiology report(s): DG Foot Complete Left  Result Date: 06/25/2020 CLINICAL DATA:  Pain, diabetic foot ulcer EXAM: LEFT FOOT - COMPLETE 3+ VIEW COMPARISON:  None. FINDINGS: There is no evidence of fracture or dislocation. No areas of cortical destruction or periosteal reaction. There is a focal area of ulceration with subcutaneous emphysema and soft tissue swelling seen over the lateral base of the fifth metatarsal. IMPRESSION: Area of ulceration overlying the fifth metatarsal base. No definite evidence of osteomyelitis. Electronically Signed   By: Prudencio Pair M.D.   On: 06/25/2020 18:13    ____________________________________________   PROCEDURES  Procedure(s) performed (including Critical Care):  Procedures   ____________________________________________   INITIAL IMPRESSION / ASSESSMENT AND PLAN / ED COURSE  As part of my medical decision making, I reviewed the following data within the electronic MEDICAL RECORD NUMBER Notes from prior ED visits and [place] Controlled Substance Database  54 year old male with diabetes type 2 poorly controlled presents to the ED with an ulcerative lesion to  left foot and cellulitis.  Patient denies any fever or chills.  He has had no nausea or vomiting.  Patient does have an open wound to the plantar aspect of his left foot with extending cellulitis on the lateral aspect of his foot.  Patient is afebrile, white count elevated at 13,000.  Glucose 187.  Patient was started on fluids, Zosyn 4.5 g IV, insulin 4 units IV and a culture was obtained of the wound.  Dr. Quentin Cornwall spoke with patient as he initially was "not interested in being hospitalized" however he now is agreeable to stay for IV antibiotics.  ____________________________________________   FINAL CLINICAL IMPRESSION(S) / ED DIAGNOSES  Final diagnoses:  Diabetic foot infection ([place])  Poorly controlled type 2 diabetes mellitus [facility])     ED Discharge Orders    None       Note:  This document was prepared using Dragon voice recognition software and may include unintentional dictation errors.    Johnn Hai, PA-C 06/25/20 1956    Merlyn Lot, MD 06/25/20 2242    Merlyn Lot, MD 06/26/20 [id number]

## 2020-06-25 NOTE — ED Triage Notes (Signed)
Pt comes via POV from home with c/o left foot pain and leg pain. Pt states he works on concrete a lot and not sure what he has done to his foot.   Pt states pain when ambulating.

## 2020-06-25 NOTE — ED Notes (Signed)
Pt transported to MRI 

## 2020-06-25 NOTE — ED Notes (Signed)
Pt to MRI

## 2020-06-25 NOTE — ED Notes (Signed)
See triage note  Presents with pain to left foot/leg    Unsure if he did anything to his foot  Ambulates with slight limp  States leg is painful with ambulation

## 2020-06-25 NOTE — H&P (Signed)
[facility] [place] NAME: [name]    MR#:  [id number]  DATE OF BIRTH:  06/17/1966  DATE OF ADMISSION:  06/25/2020  PRIMARY CARE PHYSICIAN: [name], MD   REQUESTING/REFERRING PHYSICIAN: Letitia Neri  CHIEF COMPLAINT:   Chief Complaint  Patient presents with  . Foot Pain  . Leg Pain    HISTORY OF PRESENT ILLNESS:  [name]  is a 54 y.o. male with a known history of diabetes, hypertension hyperlipidemia presents with a foot ulcer that is been going on for about a year.  Started off as a callus.  He states it has been draining pus and blood clots for about 3 months.  The foot started getting some swelling with some swelling of his right leg going on for the past week.  At times with walking he can have 9 out of 10 pain in his foot but at rest he is having no pain whatsoever.  In the ER he was found to elevate elevated white count.  Hospitalist services were contacted for further evaluation.  Of note the patient refused a Covid swab.  PAST MEDICAL HISTORY:   Past Medical History:  Diagnosis Date  . Diabetes mellitus without complication ([place])   . Hyperlipidemia   . Hypertension     PAST SURGICAL HISTORY:   Past Surgical History:  Procedure Laterality Date  . NO PAST SURGERIES      SOCIAL HISTORY:   Social History   Tobacco Use  . Smoking status: Never Smoker  . Smokeless tobacco: Never Used  Substance Use Topics  . Alcohol use: Yes    Alcohol/week: 6.0 - 12.0 standard drinks    Types: 6 - 12 Cans of beer per week    Comment: Occasional    FAMILY HISTORY:   Family History  Problem Relation Age of Onset  . Heart disease Father   . Heart attack Father   . Healthy Mother   . Prostate cancer Neg Hx   . Kidney cancer Neg Hx   . Bladder Cancer Neg Hx     DRUG ALLERGIES:  No Known Allergies  REVIEW OF SYSTEMS:  CONSTITUTIONAL: No fever, fatigue or weakness.  EYES: Wears glasses.  EARS, NOSE, AND  THROAT: No tinnitus or ear pain. No sore throat RESPIRATORY: No cough, shortness of breath, wheezing or hemoptysis.  CARDIOVASCULAR: No chest pain, orthopnea, edema.  GASTROINTESTINAL: No nausea, vomiting, diarrhea or abdominal pain. No blood in bowel movements GENITOURINARY: No dysuria, hematuria.  ENDOCRINE: No polyuria, nocturia,  HEMATOLOGY: No anemia, easy bruising or bleeding SKIN: No rash or lesion. MUSCULOSKELETAL: Some foot pain NEUROLOGIC: No tingling, numbness, weakness.  PSYCHIATRY: No anxiety or depression.   MEDICATIONS AT HOME:   Prior to Admission medications   Medication Sig Start Date End Date Taking? Authorizing Provider  amLODipine (NORVASC) 10 MG tablet TAKE 1 TABLET(10 MG) BY MOUTH DAILY 05/16/20   [name], MD  aspirin 81 MG tablet Take by mouth.    [provider]  glipiZIDE (GLUCOTROL XL) 10 MG 24 hr tablet Take 1 tablet (10 mg total) by mouth daily with breakfast. 03/10/20   Ancil Boozer, Drue Stager, MD  glucose blood (BAYER CONTOUR NEXT TEST) test strip BAYER CONTOUR NEXT TEST (In Vitro Strip)  1 (one) Strip Strip three times daily for 90 days  Quantity: 1;  Refills: 3   Ordered :03-Feb-2013  [name] ;  Started 03-Feb-2013 Active 02/03/13   [provider]  JARDIANCE 25 MG  TABS tablet TAKE 1 TABLET BY MOUTH ONCE DAILY 04/21/20   [name], MD  metFORMIN (GLUCOPHAGE) 1000 MG tablet Take 1 tablet (1,000 mg total) by mouth 2 (two) times daily with a meal. 07/02/19   Sowles, Drue Stager, MD  rosuvastatin (CRESTOR) 40 MG tablet TAKE 1 TABLET BY MOUTH EVERY OTHER DAY. USUALLY TAKES MONDAY-FRIDAY 06/23/20   [name], MD  tadalafil (CIALIS) 20 MG tablet Take 1 tablet up to 2 hours prior to intercourse on an empty stomach 06/03/19   McGowan, Larene Beach A, PA-C  torsemide (DEMADEX) 10 MG tablet Take by mouth. 10/15/19   [provider]  valsartan-hydrochlorothiazide (DIOVAN-HCT) 320-25 MG tablet TAKE 1 TABLET BY MOUTH EVERY DAY( IN PLACE OF  LISINOPRIL/HCTZ) 04/21/20   [name], MD  Vitamin D, Ergocalciferol, (DRISDOL) 1.25 MG (50000 UNIT) CAPS capsule TAKE ONE CAPSULE BY MOUTH ONCE A WEEK FOR 12 WEEKS 06/07/20   Sowles, Drue Stager, MD      VITAL SIGNS:  Blood pressure (!) 150/83, pulse 86, temperature 98 F (36.7 C), resp. rate 18, height 5\' 11"  (1.803 m), weight 94.5 kg, SpO2 98 %.  PHYSICAL EXAMINATION:  GENERAL:  54 y.o.-year-old patient lying in the bed with no acute distress.  EYES: Pupils equal, round, reactive to light and accommodation. No scleral icterus. Extraocular muscles intact.  HEENT: Head atraumatic, normocephalic. Oropharynx and nasopharynx clear.  NECK:  Supple, no jugular venous distention. No thyroid enlargement, no tenderness.  LUNGS: Normal breath sounds bilaterally, no wheezing, rales,rhonchi or crepitation. No use of accessory muscles of respiration.  CARDIOVASCULAR: S1, S2 normal. No murmurs, rubs, or gallops.  ABDOMEN: Soft, nontender, nondistended. Bowel sounds present. No organomegaly or mass.  EXTREMITIES: Left leg edema.  NEUROLOGIC: Cranial nerves II through XII are intact. Muscle strength 5/5 in all extremities. Gait not checked.  PSYCHIATRIC: The patient is alert and oriented x 3.  SKIN: See picture    LABORATORY PANEL:   CBC Recent Labs  Lab 06/25/20 1736  WBC 13.4*  HGB 12.4*  HCT 35.4*  PLT 250   ------------------------------------------------------------------------------------------------------------------  Chemistries  Recent Labs  Lab 06/25/20 1736  NA 134*  K 3.5  CL 98  CO2 20*  GLUCOSE 187*  BUN 37*  CREATININE 1.37*  CALCIUM 9.2  AST 14*  ALT 16  ALKPHOS 62  BILITOT 2.5*   ------------------------------------------------------------------------------------------------------------------   RADIOLOGY:  DG Foot Complete Left  Result Date: 06/25/2020 CLINICAL DATA:  Pain, diabetic foot ulcer EXAM: LEFT FOOT - COMPLETE 3+ VIEW COMPARISON:  None.  FINDINGS: There is no evidence of fracture or dislocation. No areas of cortical destruction or periosteal reaction. There is a focal area of ulceration with subcutaneous emphysema and soft tissue swelling seen over the lateral base of the fifth metatarsal. IMPRESSION: Area of ulceration overlying the fifth metatarsal base. No definite evidence of osteomyelitis. Electronically Signed   By: Prudencio Pair M.D.   On: 06/25/2020 18:13    EKG:   Ordered by me just in case or needed.  IMPRESSION AND PLAN:   1.  Diabetic infected foot ulcer that is draining.  Since this has been going on for a long period of time I went to get an MRI to rule out osteomyelitis.  Case discussed with Dr. Luana Shu podiatry to see the patient tomorrow morning.  Patient started on Zosyn in the emergency room and I will add vancomycin.  Wound culture sent off.  Add on sedimentation rate and CRP. 2.  Type 2 diabetes mellitus uncontrolled with hyperlipidemia.  Hold oral medications and likely will need low-dose Lantus.  Potentially can go back on oral medications upon disposition.  Continue Crestor 3.  Essential hypertension continue amlodipine and valsartan HCT 4.  Patient refused Covid swab in the emergency room.  We will put in isolation to take precautions but he does not have any Covid symptoms.  All the records are reviewed and case discussed with ED provider. Management plans discussed with the patient, family and he is in agreement.  Patient meets inpatient criteria because I believe he may end up needing debridement of his foot.  Likely will need a few days of IV antibiotics here in the hospital.  CODE STATUS: Full code  TOTAL TIME TAKING CARE OF THIS PATIENT: 50 minutes.    Loletha Grayer M.D on 06/25/2020 at 8:18 PM  Between 7am to 6pm - Pager - [phone]  After 6pm call admission pager [phone]  [facility]  CC: Primary care physician; [name], MD

## 2020-06-25 NOTE — ED Notes (Signed)
Pt refuses covid swab at this time stating "The swabs have nanobytes on the end and they give [place] all covid and vaccinate [place]".

## 2020-06-25 NOTE — Progress Notes (Signed)
Pharmacy Antibiotic Note  ULES MARSALA is a 54 y.o. male admitted on 06/25/2020. Pharmacy has been consulted for vancomycin and Zosyn dosing.  Plan: Vancomycin 2000 mg IV x 1 followed by 1250 mg IV q12h  Zosyn 3.375 g IV q8h extended infusion to start tomorrow  Height: 5\' 11"  (180.3 cm) Weight: 94.5 kg (208 lb 5.4 oz) IBW/kg (Calculated) : 75.3  Temp (24hrs), Avg:98 F (36.7 C), Min:98 F (36.7 C), Max:98 F (36.7 C)  Recent Labs  Lab 06/25/20 1736  WBC 13.4*  CREATININE 1.37*    Estimated Creatinine Clearance: 73.2 mL/min (A) (by C-G formula based on SCr of 1.37 mg/dL (H)).    No Known Allergies  Antimicrobials this admission: Vancomycin 7/16 >> Zosyn 7/16 >>   Microbiology results: 7/16 Wound Cx: pending  Thank you for allowing pharmacy to be a part of this patient's care.  Tawnya Crook, PharmD 06/25/2020 8:25 PM

## 2020-06-26 DIAGNOSIS — E11621 Type 2 diabetes mellitus with foot ulcer: Secondary | ICD-10-CM

## 2020-06-26 DIAGNOSIS — L97422 Non-pressure chronic ulcer of left heel and midfoot with fat layer exposed: Secondary | ICD-10-CM

## 2020-06-26 LAB — BASIC METABOLIC PANEL
Anion gap: 17 — ABNORMAL HIGH (ref 5–15)
BUN: 29 mg/dL — ABNORMAL HIGH (ref 6–20)
CO2: 19 mmol/L — ABNORMAL LOW (ref 22–32)
Calcium: 9 mg/dL (ref 8.9–10.3)
Chloride: 100 mmol/L (ref 98–111)
Creatinine, Ser: 1.15 mg/dL (ref 0.61–1.24)
GFR calc Af Amer: >60 mL/min (ref >60)
GFR calc non Af Amer: >60 mL/min (ref >60)
Glucose, Bld: 103 mg/dL — ABNORMAL HIGH (ref 70–99)
Potassium: 3.3 mmol/L — ABNORMAL LOW (ref 3.5–5.1)
Sodium: 136 mmol/L (ref 135–145)

## 2020-06-26 LAB — CBC
HCT: 34.9 % — ABNORMAL LOW (ref 39.0–52.0)
Hemoglobin: 11.6 g/dL — ABNORMAL LOW (ref 13.0–17.0)
MCH: 31.1 pg (ref 26.0–34.0)
MCHC: 33.2 g/dL (ref 30.0–36.0)
MCV: 93.6 fL (ref 80.0–100.0)
Platelets: 246 10*3/uL (ref 150–400)
RBC: 3.73 MIL/uL — ABNORMAL LOW (ref 4.22–5.81)
RDW: 11.6 % (ref 11.5–15.5)
WBC: 12.8 10*3/uL — ABNORMAL HIGH (ref 4.0–10.5)
nRBC: 0 % (ref 0.0–0.2)

## 2020-06-26 LAB — GLUCOSE, CAPILLARY
Glucose-Capillary: 191 mg/dL — ABNORMAL HIGH (ref 70–99)
Glucose-Capillary: 246 mg/dL — ABNORMAL HIGH (ref 70–99)
Glucose-Capillary: 215 mg/dL — ABNORMAL HIGH (ref 70–99)

## 2020-06-26 LAB — HEMOGLOBIN A1C
Hgb A1c MFr Bld: 8 % — ABNORMAL HIGH (ref 4.8–5.6)
Mean Plasma Glucose: 182.9 mg/dL

## 2020-06-26 LAB — SEDIMENTATION RATE: Sed Rate: 68 mm/hr — ABNORMAL HIGH (ref 0–20)

## 2020-06-26 MED ORDER — VANCOMYCIN HCL IN DEXTROSE 1-5 GM/200ML-% IV SOLN
1000.0000 mg | Freq: Three times a day (TID) | INTRAVENOUS | Status: DC
  Administered 2020-06-26 – 2020-06-27 (×2): 1000 mg via INTRAVENOUS
  Filled 2020-06-26 (×4): qty 200

## 2020-06-26 MED ORDER — HYDROCHLOROTHIAZIDE 25 MG PO TABS
25.0000 mg | ORAL_TABLET | Freq: Every day | ORAL | Status: DC
  Administered 2020-06-26 – 2020-07-01 (×6): 25 mg via ORAL
  Filled 2020-06-26 (×6): qty 1

## 2020-06-26 MED ORDER — HYDRALAZINE HCL 50 MG PO TABS: 25.0000 mg | ORAL_TABLET | Freq: Three times a day (TID) | ORAL | Status: DC | PRN

## 2020-06-26 MED ORDER — IRBESARTAN 150 MG PO TABS
300.0000 mg | ORAL_TABLET | Freq: Every day | ORAL | Status: DC
  Administered 2020-06-26 – 2020-07-01 (×6): 300 mg via ORAL
  Filled 2020-06-26 (×6): qty 2

## 2020-06-26 MED ORDER — POTASSIUM CHLORIDE 20 MEQ PO PACK
60.0000 meq | PACK | Freq: Once | ORAL | Status: AC
  Administered 2020-06-26: 60 meq via ORAL
  Filled 2020-06-26: qty 3

## 2020-06-26 NOTE — Anesthesia Preprocedure Evaluation (Addendum)
Anesthesia Evaluation  Patient identified by MRN, date of birth, ID band Patient awake    Reviewed: Allergy & Precautions, H&P , NPO status , Patient's Chart, lab work & pertinent test results, reviewed documented beta blocker date and time   History of Anesthesia Complications Negative for: history of anesthetic complications  Airway Mallampati: I  TM Distance: >3 FB Neck ROM: full    Dental  (+) Dental Advidsory Given, Teeth Intact   Pulmonary neg pulmonary ROS,    Pulmonary exam normal breath sounds clear to auscultation       Cardiovascular Exercise Tolerance: Good hypertension, (-) angina(-) Past MI and (-) Cardiac Stents Normal cardiovascular exam(-) dysrhythmias (-) Valvular Problems/Murmurs Rhythm:regular Rate:Normal     Neuro/Psych negative neurological ROS  negative psych ROS   GI/Hepatic negative GI ROS, Neg liver ROS,   Endo/Other  diabetes  Renal/GU CRFRenal disease  negative genitourinary   Musculoskeletal   Abdominal   Peds  Hematology negative hematology ROS (+)   Anesthesia Other Findings Past Medical History: No date: Diabetes mellitus without complication (HCC) No date: Hyperlipidemia No date: Hypertension   Reproductive/Obstetrics negative OB ROS                            Anesthesia Physical Anesthesia Plan  ASA: III and emergent  Anesthesia Plan: General   Post-op Pain Management:    Induction: Intravenous and Rapid sequence  PONV Risk Score and Plan: 2 and Ondansetron, Dexamethasone, Midazolam and Treatment may vary due to age or medical condition  Airway Management Planned: Oral ETT  Additional Equipment:   Intra-op Plan:   Post-operative Plan: Extubation in OR  Informed Consent: I have reviewed the patients History and Physical, chart, labs and discussed the procedure including the risks, benefits and alternatives for the proposed anesthesia with  the patient or authorized representative who has indicated his/her understanding and acceptance.     Dental Advisory Given  Plan Discussed with: Anesthesiologist, CRNA and Surgeon  Anesthesia Plan Comments: (Patient refused COVID testing, but the case was emergent thus we proceeded as a suspected positive.  Patient will receive a rapid sequence intubation to avoid aerosolization, but no cricoid will be done as we are not trying to prevent aspiration.)      Anesthesia Quick Evaluation

## 2020-06-26 NOTE — ED Notes (Signed)
Pt given meal tray and blanket

## 2020-06-26 NOTE — ED Notes (Signed)
Pt having to be continually told to keep arm straight for antibiotics to run

## 2020-06-26 NOTE — Progress Notes (Signed)
Pharmacy Antibiotic Note  [name] is a 54 y.o. male admitted on 06/25/2020. Pharmacy has been consulted for vancomycin and Zosyn dosing. Patient with bedside I&D to left foot 7/17 with plan for further procedure 7/18.  Plan: Change vancomycin to 1000 mg IV q8h  Zosyn 3.375 g IV q8h extended infusion  Height: 5\' 11"  (180.3 cm) Weight: 94.5 kg (208 lb 5.4 oz) IBW/kg (Calculated) : 75.3  Temp (24hrs), Avg:98 F (36.7 C), Min:98 F (36.7 C), Max:98 F (36.7 C)  Recent Labs  Lab 06/25/20 1736 06/26/20 0458  WBC 13.4* 12.8*  CREATININE 1.37* 1.15    Estimated Creatinine Clearance: 87.2 mL/min (by C-G formula based on SCr of 1.15 mg/dL).    No Known Allergies  Antimicrobials this admission: Vancomycin 7/16 >> Zosyn 7/16 >>   Microbiology results: 7/16 Wound Cx: pending  Thank you for allowing pharmacy to be a part of this patient's care.  Tawnya Crook, PharmD 06/26/2020 1:46 PM

## 2020-06-26 NOTE — Progress Notes (Signed)
PROGRESS NOTE    CYNCERE SONTAG  WUJ:[id number] DOB: 1966/03/07 DOA: 06/25/2020 PCP: [name], MD    Chief Complaint  Patient presents with  . Foot Pain  . Leg Pain    Brief Narrative:  54 year old with known history of diabetes, hypertension, hyperlipidemia, peripheral neuropathy presents with a nonhealing left foot ulcer on 06/25/2020.  On arrival he was afebrile, slightly hypertensive with right foot pain.  MRI of the left foot shows large area of ulceration over the lateral base of the fifth metatarsal with a sinus tract, subcutaneous emphysema and a loculated abscess along with findings suggestive of early osteomyelitis involving the fifth metatarsal bone. Podiatry consulted and he is scheduled for incision and drainage with washout with fifth metatarsal base debridement tomorrow.  He will ultimately need vascular surgery consultation for potential optimization of flow.  He was started on broad-spectrum IV antibiotics, wound culture sent for analysis and admitted to [facility] service.  Assessment & Plan:   Active Problems:   Type 2 diabetes mellitus with hyperlipidemia (HCC)   Diabetic foot ulcer (HCC)   Osteomyelitis of the fifth metatarsal bone and left foot abscess with surrounding cellulitis Initially started on broad-spectrum IV antibiotics, continue the same follow wound cultures and narrow the antibiotics. Appreciate podiatry recommendations plan for incision and drainage with washout tomorrow.  N.p.o. after midnight Pain control. Podiatry recommends nonweightbearing after the procedure of the left lower extremity. Patient is afebrile and leukocytosis improving with IV antibiotics. Follow sed rate, CRP.  Uncontrolled diabetes mellitus with hyperglycemia with peripheral neuropathy Hemoglobin A1c at 8. CBG (last 3)  Recent Labs    06/25/20 2306 06/26/20 1013  GLUCAP 92 191*   Continue with sliding scale insulin.    Hypertensive urgency/uncontrolled  hypertension Resume home medication and start the patient on as needed hydralazine.    Hypokalemia Replaced.    Hyperlipidemia Continue with statin.    AKI probably secondary to ATN from infection. Creatinine improved with IV fluids. Repeat renal parameters in the morning.   Leukocytosis improving    Mild normocytic anemia Continue to monitor.     DVT prophylaxis: LOVENOX.  Code Status: Full code Family Communication: none at bedside.  Disposition:   Status is: Inpatient  Remains inpatient appropriate because:Ongoing diagnostic testing needed not appropriate for outpatient work up, IV treatments appropriate due to intensity of illness or inability to take PO and Inpatient level of care appropriate due to severity of illness   Dispo: The patient is from: Home              Anticipated d/c is to: Home              Anticipated d/c date is: 3 days              Patient currently is not medically stable to d/c.       Consultants:   Podiatry Dr Luana Shu.   Procedures: none.  Antimicrobials:  Anti-infectives (From admission, onward)   Start     Dose/Rate Route Frequency Ordered Stop   06/26/20 2000  vancomycin (VANCOCIN) IVPB 1000 mg/200 mL premix     Discontinue     1,000 mg 200 mL/hr over 60 Minutes Intravenous Every 8 hours 06/26/20 1350     06/26/20 1000  vancomycin (VANCOREADY) IVPB 1250 mg/250 mL  Status:  Discontinued        1,250 mg 166.7 mL/hr over 90 Minutes Intravenous Every 12 hours 06/25/20 2024 06/26/20 1350   06/26/20 0600  piperacillin-tazobactam (ZOSYN) IVPB 3.375 g     Discontinue     3.375 g 12.5 mL/hr over 240 Minutes Intravenous Every 8 hours 06/25/20 2024     06/25/20 2100  vancomycin (VANCOREADY) IVPB 2000 mg/400 mL        2,000 mg 200 mL/hr over 120 Minutes Intravenous  Once 06/25/20 2024 06/25/20 2259   06/25/20 1900  piperacillin-tazobactam (ZOSYN) IVPB 4.5 g  Status:  Discontinued        4.5 g 200 mL/hr over 30 Minutes Intravenous   Once 06/25/20 1832 06/25/20 1837   06/25/20 1900  piperacillin-tazobactam (ZOSYN) IVPB 2.25 g        2.25 g 100 mL/hr over 30 Minutes Intravenous Every 30 min 06/25/20 1837 06/25/20 2023   06/25/20 1815  piperacillin-tazobactam (ZOSYN) IVPB 4.5 g  Status:  Discontinued        4.5 g 200 mL/hr over 30 Minutes Intravenous  Once 06/25/20 1801 06/25/20 1837          Subjective: Wants to go home, reports no pain. No chest pain , nausea, vomiting or abdominal pain.   Objective: Vitals:   06/26/20 1100 06/26/20 1101 06/26/20 1125 06/26/20 1134  BP: (!) 147/78  (!) 147/78 (!) 153/78  Pulse:  77 77 76  Resp:   18   Temp:      SpO2:  94% 95% 95%  Weight:      Height:        Intake/Output Summary (Last 24 hours) at 06/26/2020 1415 Last data filed at 06/26/2020 1120 Gross per 24 hour  Intake 1150 ml  Output --  Net 1150 ml   Filed Weights   06/25/20 1629  Weight: 94.5 kg    Examination:  General exam: Appears calm and comfortable  Respiratory system: Clear to auscultation. Respiratory effort normal. Cardiovascular system: S1 & S2 heard, RRR. No JVD, No pedal edema. Gastrointestinal system: Abdomen is nondistended, soft and nontender.  Normal bowel sounds heard. Central nervous system: Alert and oriented. No focal neurological deficits. Extremities: left foot erythema, swelling, with draining ulcer  At the site of the fifth metatarsal base. Skin: see above. Psychiatry:  Mood & affect appropriate.     Data Reviewed: I have personally reviewed following labs and imaging studies  CBC: Recent Labs  Lab 06/25/20 1736 06/26/20 0458  WBC 13.4* 12.8*  NEUTROABS 11.0*  --   HGB 12.4* 11.6*  HCT 35.4* 34.9*  MCV 89.6 93.6  PLT 250 443    Basic Metabolic Panel: Recent Labs  Lab 06/25/20 1736 06/26/20 0458  NA 134* 136  K 3.5 3.3*  CL 98 100  CO2 20* 19*  GLUCOSE 187* 103*  BUN 37* 29*  CREATININE 1.37* 1.15  CALCIUM 9.2 9.0    GFR: Estimated Creatinine  Clearance: 87.2 mL/min (by C-G formula based on SCr of 1.15 mg/dL).  Liver Function Tests: Recent Labs  Lab 06/25/20 1736  AST 14*  ALT 16  ALKPHOS 62  BILITOT 2.5*  PROT 8.0  ALBUMIN 3.6    CBG: Recent Labs  Lab 06/25/20 2306 06/26/20 1013  GLUCAP 92 191*     Recent Results (from the past 240 hour(s))  Aerobic/Anaerobic Culture (surgical/deep wound)     Status: None (Preliminary result)   Collection Time: 06/25/20  7:08 PM   Specimen: Foot; Tissue  Result Value Ref Range Status   Specimen Description   Final    FOOT Performed at [facility], [address] [place], [place] [address]  Special Requests   Final    Normal Performed at [place], [place] [address]    Gram Stain   Final    RARE WBC PRESENT, PREDOMINANTLY PMN ABUNDANT GRAM NEGATIVE RODS ABUNDANT GRAM POSITIVE COCCI IN CHAINS IN CLUSTERS Performed at [facility], [place] [address], [place], [place] [address]    Culture PENDING  Incomplete   Report Status PENDING  Incomplete         Radiology Studies: MR FOOT LEFT WO CONTRAST  Result Date: 06/25/2020 CLINICAL DATA:  Foot ulcer, question of osteomyelitis EXAM: MRI OF THE LEFT FOOT WITHOUT CONTRAST TECHNIQUE: Multiplanar, multisequence MR imaging of the left was performed. No intravenous contrast was administered. COMPARISON:  Radiograph same day FINDINGS: Bones/Joint/Cartilage There is a large area of ulceration seen overlying the lateral base of the fifth metatarsal with subcutaneous emphysema and a sinus tract to the underlying osseous surface. There is a focal loculated fluid collections seen along the lateral surface measuring approximately 2.2 x 0.9 x 3.0 cm. There is increased T2 marrow signal seen at the lateral base of the fifth metatarsal with subtly increased T1 hypointensity. There is also mildly increased T2 hyperintense signal seen within the cuboid, lateral cuneiform, lateral aspect of  the anterior calcaneus without T1 hypointensity. No fracture or areas of cortical destruction however are noted. Ligaments Lisfranc ligaments are intact. Muscles and Tendons Diffusely increased feathery signal seen within the muscles surrounding the forefoot. The flexor and extensor tendons are intact. Soft tissues As described above large area of ulceration seen over the lateral aspect of the fifth metatarsal base with subcutaneous emphysema and a sinus tract. There is diffuse subcutaneous edema and skin thickening. A loculated fluid collection is also noted along the lateral base. IMPRESSION: 1. Large area of ulceration over the lateral base the fifth metatarsal with a sinus tract, subcutaneous emphysema and a loculated abscess as described above. 2. Findings suggestive of early osteomyelitis involving the fifth metatarsal base. 3. Mild reactive marrow seen within the anterior calcaneus, cuboid, and lateral cuneiform. Electronically Signed   By: Prudencio Pair M.D.   On: 06/25/2020 23:05   DG Foot Complete Left  Result Date: 06/25/2020 CLINICAL DATA:  Pain, diabetic foot ulcer EXAM: LEFT FOOT - COMPLETE 3+ VIEW COMPARISON:  None. FINDINGS: There is no evidence of fracture or dislocation. No areas of cortical destruction or periosteal reaction. There is a focal area of ulceration with subcutaneous emphysema and soft tissue swelling seen over the lateral base of the fifth metatarsal. IMPRESSION: Area of ulceration overlying the fifth metatarsal base. No definite evidence of osteomyelitis. Electronically Signed   By: Prudencio Pair M.D.   On: 06/25/2020 18:13        Scheduled Meds: . amLODipine  10 mg Oral Daily  . aspirin EC  81 mg Oral Daily  . enoxaparin (LOVENOX) injection  40 mg Subcutaneous Q24H  . irbesartan  300 mg Oral Daily   And  . hydrochlorothiazide  25 mg Oral Daily  . insulin aspart  0-5 Units Subcutaneous QHS  . insulin aspart  0-9 Units Subcutaneous TID WC  . rosuvastatin  40 mg Oral  Daily   Continuous Infusions: . piperacillin-tazobactam (ZOSYN)  IV Stopped (06/26/20 1120)  . vancomycin       LOS: 1 day        Hosie Poisson, MD [facility]   To contact the attending provider between 7A-7P or the covering provider during after hours 7P-7A,  please log into the web site [email or web address] and access using universal [place] password for that web site. If you do not have the password, please call the hospital operator.  06/26/2020, 2:15 PM

## 2020-06-26 NOTE — ED Notes (Signed)
Report given to MacKenzie, RN 

## 2020-06-26 NOTE — Consult Note (Addendum)
PODIATRY / FOOT AND ANKLE SURGERY CONSULTATION NOTE  Requesting Physician: Loletha Grayer MD  Reason for consult: Left foot ulcer/infection  Chief Complaint: Left foot ulcer, drainage, infection   HPI: [name] is a 54 y.o. male who presents with a nonhealing ulcer for about a year now onto his left foot at the lateral aspect of the foot.  Patient states that started off as a callus but eventually broke down and became wound.  He notes that its been draining pus and blood for about 3 months or so and has yet to get it checked out.  His right foot and ankle has been swelling over the past week and getting worse.  Patient rates the pain at 9/10.  He was found in the ED to have a elevated white blood cell count and was admitted to the hospital for further evaluation.  Patient had an x-ray taken while in the ED which did not show any bone infection but did show some mild soft tissue emphysema present around the fifth metatarsal base in the area the ulceration.  An MRI was performed also while in the ED which showed early changes suggestive of osteomyelitis to the left fifth metatarsal base area along with a collection of fluid around that area consistent with abscess.  Patient has refused Covid swabs.  Patient is talking about subjects in the room which do not pertain to his foot and more so related to [employer] and social media.  Patient states that there are nanobytes at the end of the Covid swabs that give you Covid and make you get the vaccine.  Patient states that he is diabetic and does have numbness and tingling to both feet.  PMHx:  Past Medical History:  Diagnosis Date  . Diabetes mellitus without complication ([place])   . Hyperlipidemia   . Hypertension     Surgical Hx:  Past Surgical History:  Procedure Laterality Date  . NO PAST SURGERIES      FHx:  Family History  Problem Relation Age of Onset  . Heart disease Father   . Heart attack Father   . Healthy Mother    . Prostate cancer Neg Hx   . Kidney cancer Neg Hx   . Bladder Cancer Neg Hx     Social History:  reports that he has never smoked. He has never used smokeless tobacco. He reports current alcohol use of about 6.0 - 12.0 standard drinks of alcohol per week. He reports that he does not use drugs.  Allergies: No Known Allergies  Review of Systems: General ROS: negative Psychological ROS: negative Respiratory ROS: no cough, shortness of breath, or wheezing Cardiovascular ROS: no chest pain or dyspnea on exertion Gastrointestinal ROS: no abdominal pain, change in bowel habits, or black or bloody stools Musculoskeletal ROS: positive for - joint pain and joint swelling Neurological ROS: positive for - numbness/tingling Dermatological ROS: positive for Draining ulcer/abscess to the left fifth metatarsal base  (Not in a hospital admission)   Physical Exam: General: Alert and oriented.  No apparent distress.  Vascular: DP pulse right and left palpable, PT pulses nonpalpable bilateral.  Capillary fill time appears to be intact to digits bilateral.  Moderate to severe swelling and erythema to the lateral aspect of the left foot.  Some dusky changes present to the dorsal lateral aspect of the foot left.  Neuro: Light touch sensation absent to bilateral lower extremities.  Derm: Ulceration present to the plantar lateral aspect of the left fifth  metatarsal base with draining abscess present.  Appears to probe to bone at that level at the fifth metatarsal base.  Abscess appears to be going up the peroneal tendon sheath and dorsum of the foot.  Erythema and edema surrounding the periphery of the wound that is extending over the dorsal lateral foot to the ankle.    MSK: Minimal to no pain on palpation to the left foot at the fifth metatarsal base area.  Cavovarus foot type bilateral.  Results for orders placed or performed during the hospital encounter of 06/25/20 (from the past 48 hour(s))  CBC  with Differential     Status: Abnormal   Collection Time: 06/25/20  5:36 PM  Result Value Ref Range   WBC 13.4 (H) 4.0 - 10.5 K/uL   RBC 3.95 (L) 4.22 - 5.81 MIL/uL   Hemoglobin 12.4 (L) 13.0 - 17.0 g/dL   HCT 35.4 (L) 39 - 52 %   MCV 89.6 80.0 - 100.0 fL   MCH 31.4 26.0 - 34.0 pg   MCHC 35.0 30.0 - 36.0 g/dL   RDW 11.7 11.5 - 15.5 %   Platelets 250 150 - 400 K/uL   nRBC 0.0 0.0 - 0.2 %   Neutrophils Relative % 83 %   Neutro Abs 11.0 (H) 1.7 - 7.7 K/uL   Lymphocytes Relative 6 %   Lymphs Abs 0.8 0.7 - 4.0 K/uL   Monocytes Relative 11 %   Monocytes Absolute 1.5 (H) 0 - 1 K/uL   Eosinophils Relative 0 %   Eosinophils Absolute 0.0 0 - 0 K/uL   Basophils Relative 0 %   Basophils Absolute 0.1 0 - 0 K/uL   Immature Granulocytes 0 %   Abs Immature Granulocytes 0.06 0.00 - 0.07 K/uL    Comment: Performed at [facility], [address] [place], [place] [address]  Comprehensive metabolic panel     Status: Abnormal   Collection Time: 06/25/20  5:36 PM  Result Value Ref Range   Sodium 134 (L) 135 - 145 mmol/L   Potassium 3.5 3.5 - 5.1 mmol/L   Chloride 98 98 - 111 mmol/L   CO2 20 (L) 22 - 32 mmol/L   Glucose, Bld 187 (H) 70 - 99 mg/dL    Comment: Glucose reference range applies only to samples taken after fasting for at least 8 hours.   BUN 37 (H) 6 - 20 mg/dL   Creatinine, Ser 1.37 (H) 0.61 - 1.24 mg/dL   Calcium 9.2 8.9 - 10.3 mg/dL   Total Protein 8.0 6.5 - 8.1 g/dL   Albumin 3.6 3.5 - 5.0 g/dL   AST 14 (L) 15 - 41 U/L   ALT 16 0 - 44 U/L   Alkaline Phosphatase 62 38 - 126 U/L   Total Bilirubin 2.5 (H) 0.3 - 1.2 mg/dL   GFR calc non Af Amer 58 (L) >60 mL/min   GFR calc Af Amer >60 >60 mL/min   Anion gap 16 (H) 5 - 15    Comment: Performed at [facility], [address] [place], [place] [address]  Hemoglobin A1c     Status: Abnormal   Collection Time: 06/25/20  5:36 PM  Result Value Ref Range   Hgb A1c MFr Bld 8.0 (H) 4.8 - 5.6 %    Comment:  (NOTE) Pre diabetes:          5.7%-6.4%  Diabetes:              >6.4%  Glycemic control  for   <7.0% adults with diabetes    Mean Plasma Glucose 182.9 mg/dL    Comment: Performed at [facility], [place] [address], [place], [place] [address]  Aerobic/Anaerobic Culture (surgical/deep wound)     Status: None (Preliminary result)   Collection Time: 06/25/20  7:08 PM   Specimen: Foot; Tissue  Result Value Ref Range   Specimen Description      FOOT Performed at [facility], [address] [place], [place] [address]    Special Requests      Normal Performed at [facility], [place], [place] [address]    Gram Stain      RARE WBC PRESENT, PREDOMINANTLY PMN ABUNDANT GRAM NEGATIVE RODS ABUNDANT GRAM POSITIVE COCCI IN CHAINS IN CLUSTERS Performed at [facility], [place] [address], [place], [place] [address]    Culture PENDING    Report Status PENDING   Glucose, capillary     Status: None   Collection Time: 06/25/20 11:06 PM  Result Value Ref Range   Glucose-Capillary 92 70 - 99 mg/dL    Comment: Glucose reference range applies only to samples taken after fasting for at least 8 hours.  CBC     Status: Abnormal   Collection Time: 06/26/20  4:58 AM  Result Value Ref Range   WBC 12.8 (H) 4.0 - 10.5 K/uL   RBC 3.73 (L) 4.22 - 5.81 MIL/uL   Hemoglobin 11.6 (L) 13.0 - 17.0 g/dL   HCT 34.9 (L) 39 - 52 %   MCV 93.6 80.0 - 100.0 fL   MCH 31.1 26.0 - 34.0 pg   MCHC 33.2 30.0 - 36.0 g/dL   RDW 11.6 11.5 - 15.5 %   Platelets 246 150 - 400 K/uL   nRBC 0.0 0.0 - 0.2 %    Comment: Performed at [facility], [address] [place], [place] [address]  Basic metabolic panel     Status: Abnormal   Collection Time: 06/26/20  4:58 AM  Result Value Ref Range   Sodium 136 135 - 145 mmol/L   Potassium 3.3 (L) 3.5 - 5.1 mmol/L   Chloride 100 98 - 111 mmol/L   CO2 19 (L) 22 - 32 mmol/L   Glucose, Bld 103 (H) 70 - 99 mg/dL    Comment: Glucose  reference range applies only to samples taken after fasting for at least 8 hours.   BUN 29 (H) 6 - 20 mg/dL   Creatinine, Ser 1.15 0.61 - 1.24 mg/dL   Calcium 9.0 8.9 - 10.3 mg/dL   GFR calc non Af Amer >60 >60 mL/min   GFR calc Af Amer >60 >60 mL/min   Anion gap 17 (H) 5 - 15    Comment: Performed at [facility], [address] [place], [place] [address]  ESR     Status: Abnormal   Collection Time: 06/26/20  4:58 AM  Result Value Ref Range   Sed Rate 68 (H) 0 - 20 mm/hr    Comment: Performed at [facility], [address] [place], [place] [address]  Glucose, capillary     Status: Abnormal   Collection Time: 06/26/20 10:13 AM  Result Value Ref Range   Glucose-Capillary 191 (H) 70 - 99 mg/dL    Comment: Glucose reference range applies only to samples taken after fasting for at least 8 hours.   MR FOOT LEFT WO CONTRAST  Result Date: 06/25/2020 CLINICAL DATA:  Foot ulcer, question of  osteomyelitis EXAM: MRI OF THE LEFT FOOT WITHOUT CONTRAST TECHNIQUE: Multiplanar, multisequence MR imaging of the left was performed. No intravenous contrast was administered. COMPARISON:  Radiograph same day FINDINGS: Bones/Joint/Cartilage There is a large area of ulceration seen overlying the lateral base of the fifth metatarsal with subcutaneous emphysema and a sinus tract to the underlying osseous surface. There is a focal loculated fluid collections seen along the lateral surface measuring approximately 2.2 x 0.9 x 3.0 cm. There is increased T2 marrow signal seen at the lateral base of the fifth metatarsal with subtly increased T1 hypointensity. There is also mildly increased T2 hyperintense signal seen within the cuboid, lateral cuneiform, lateral aspect of the anterior calcaneus without T1 hypointensity. No fracture or areas of cortical destruction however are noted. Ligaments Lisfranc ligaments are intact. Muscles and Tendons Diffusely increased feathery signal seen within the muscles  surrounding the forefoot. The flexor and extensor tendons are intact. Soft tissues As described above large area of ulceration seen over the lateral aspect of the fifth metatarsal base with subcutaneous emphysema and a sinus tract. There is diffuse subcutaneous edema and skin thickening. A loculated fluid collection is also noted along the lateral base. IMPRESSION: 1. Large area of ulceration over the lateral base the fifth metatarsal with a sinus tract, subcutaneous emphysema and a loculated abscess as described above. 2. Findings suggestive of early osteomyelitis involving the fifth metatarsal base. 3. Mild reactive marrow seen within the anterior calcaneus, cuboid, and lateral cuneiform. Electronically Signed   By: Prudencio Pair M.D.   On: 06/25/2020 23:05   DG Foot Complete Left  Result Date: 06/25/2020 CLINICAL DATA:  Pain, diabetic foot ulcer EXAM: LEFT FOOT - COMPLETE 3+ VIEW COMPARISON:  None. FINDINGS: There is no evidence of fracture or dislocation. No areas of cortical destruction or periosteal reaction. There is a focal area of ulceration with subcutaneous emphysema and soft tissue swelling seen over the lateral base of the fifth metatarsal. IMPRESSION: Area of ulceration overlying the fifth metatarsal base. No definite evidence of osteomyelitis. Electronically Signed   By: Prudencio Pair M.D.   On: 06/25/2020 18:13    Blood pressure (!) 153/78, pulse 76, temperature 98 F (36.7 C), resp. rate 18, height 5' 11" (1.803 m), weight 94.5 kg, SpO2 95 %.  Assessment 1. Left fifth metatarsal base osteomyelitis with associated ulceration with abscess formation 2. Cellulitis left foot 3. Diabetes type 2 polyneuropathy 4. PVD  Plan -Patient seen and examined. -Reviewed MRI with patient in detail showing abscess present to the fifth metatarsal base as well as early changes suggestive of osteomyelitis to the fifth metatarsal base left foot. -X-ray imaging also reviewed. -Discussed all treatment  options with the patient both conservative and surgical attempts at correction including potential risks and complications.  At this time patient has elected for procedure consisting of bedside incision and drainage at the left fifth metatarsal base.  Verbal consent obtained.  Procedure described below.  Procedure: Left foot incision and drainage fifth metatarsal base Prepped the left foot with Betadine at the fifth metatarsal base area.  At that time the foot was then exsanguinated at the fifth metatarsal base and a large amount of purulence was able to be expressed.  An incision was then made over the lateral aspect of the foot at the fifth metatarsal base and around the ulceration present.  All necrotic and nonviable tissue was removed and a large pus pocket was able to be expressed releasing about 5 to 10 cc of purulent  material.  The fifth metatarsal base appeared to be exposed at the site as well as the peroneal tendons.  The surgical site was then flushed with copious amounts normal sterile saline and Betadine.  Iodosorb packing gauze with Betadine was then placed inside the wound.  Hemostasis was achieved with compression and a dressing is applied consisting of Betadine soaked 4 x 4 gauze, Kerlix, ABD, Ace wrap.  Patient tolerated the procedure well.  Patient denied any pain at that time.  -Discussed treatment options with patient of both conservative and surgical attempts at correction.  Discussed with patient the need for further incision and drainage with washout with fifth metatarsal base debridement versus partial ray amputation and bone biopsy.  Discussed with patient that removal of the fifth metatarsal base can cause an adductovarus contracture in the foot.  Patient may need to have a staged procedure in which the infection is removed first and then subsequently go back and perform a peroneus brevis to longus tendon transfer with tendo Achilles lengthening.  If has residual varus contracture  after that then he would likely need a partial tibialis anterior tendon transfer to the lateral aspect of the foot.  Discussed with patient that these procedures are more elective and currently at this point we should just attempt to remove all the infection is present.  Once the infection is removed we could consider these options in the future.  Patient is agreeable to to procedure once the benefits and complications were discussed in detail. -Patient to be n.p.o. at midnight for surgery on 06/27/2020.  Orders have been placed. -Culture pending.  Appreciate medicine recommendations for IV antibiotics.  Continue. -Likely to consult vascular surgery next week for potential optimization of flow but need to rid of infection first. -Patient will be required to be nonweightbearing after the procedure to the left lower extremity.  But for now keep surgical dressings clean, dry, and intact.  Caroline More, DPM 06/26/2020, 11:55 AM

## 2020-06-26 NOTE — ED Notes (Signed)
Pt IV beeping and restarted antibiotics- pt has no other needs at this time

## 2020-06-27 ENCOUNTER — Encounter
Admission: EM | Disposition: A | Discharge status: Home / Self Care | Payer: Self-pay | Source: Home / Self Care | Attending: Internal Medicine

## 2020-06-27 ENCOUNTER — Inpatient Hospital Stay: Payer: Managed Care, Other (non HMO) | Admitting: Anesthesiology

## 2020-06-27 ENCOUNTER — Inpatient Hospital Stay: Payer: Managed Care, Other (non HMO)

## 2020-06-27 HISTORY — PX: INCISION AND DRAINAGE: SHX5863

## 2020-06-27 HISTORY — PX: BONE BIOPSY: SHX375

## 2020-06-27 LAB — HIV ANTIBODY (ROUTINE TESTING W REFLEX): HIV Screen 4th Generation wRfx: NONREACTIVE

## 2020-06-27 LAB — CBC
HCT: 34.4 % — ABNORMAL LOW (ref 39.0–52.0)
Hemoglobin: 11.7 g/dL — ABNORMAL LOW (ref 13.0–17.0)
MCH: 31.5 pg (ref 26.0–34.0)
MCHC: 34 g/dL (ref 30.0–36.0)
MCV: 92.7 fL (ref 80.0–100.0)
Platelets: 281 10*3/uL (ref 150–400)
RBC: 3.71 MIL/uL — ABNORMAL LOW (ref 4.22–5.81)
RDW: 11.7 % (ref 11.5–15.5)
WBC: 8.7 10*3/uL (ref 4.0–10.5)
nRBC: 0 % (ref 0.0–0.2)

## 2020-06-27 LAB — GLUCOSE, CAPILLARY
Glucose-Capillary: 208 mg/dL — ABNORMAL HIGH (ref 70–99)
Glucose-Capillary: 147 mg/dL — ABNORMAL HIGH (ref 70–99)
Glucose-Capillary: 315 mg/dL — ABNORMAL HIGH (ref 70–99)

## 2020-06-27 LAB — BASIC METABOLIC PANEL
Anion gap: 10 (ref 5–15)
BUN: 32 mg/dL — ABNORMAL HIGH (ref 6–20)
CO2: 23 mmol/L (ref 22–32)
Calcium: 9.1 mg/dL (ref 8.9–10.3)
Chloride: 104 mmol/L (ref 98–111)
Creatinine, Ser: 1.26 mg/dL — ABNORMAL HIGH (ref 0.61–1.24)
GFR calc Af Amer: >60 mL/min (ref >60)
GFR calc non Af Amer: >60 mL/min (ref >60)
Glucose, Bld: 207 mg/dL — ABNORMAL HIGH (ref 70–99)
Potassium: 3.9 mmol/L (ref 3.5–5.1)
Sodium: 137 mmol/L (ref 135–145)

## 2020-06-27 LAB — C-REACTIVE PROTEIN: CRP: 15.5 mg/dL — ABNORMAL HIGH (ref <1.0)

## 2020-06-27 SURGERY — INCISION AND DRAINAGE
Anesthesia: General | Laterality: Left

## 2020-06-27 MED ORDER — VANCOMYCIN HCL 1000 MG IV SOLR
INTRAVENOUS | Status: AC
  Filled 2020-06-27: qty 1000

## 2020-06-27 MED ORDER — VANCOMYCIN HCL 1000 MG IV SOLR
INTRAVENOUS | Status: DC | PRN
  Administered 2020-06-27: 1000 mg

## 2020-06-27 MED ORDER — ONDANSETRON HCL 4 MG/2ML IJ SOLN
INTRAMUSCULAR | Status: AC
  Filled 2020-06-27: qty 2

## 2020-06-27 MED ORDER — PHENYLEPHRINE HCL (PRESSORS) 10 MG/ML IV SOLN
INTRAVENOUS | Status: DC | PRN
  Administered 2020-06-27: 150 ug via INTRAVENOUS
  Administered 2020-06-27 (×2): 200 ug via INTRAVENOUS
  Administered 2020-06-27 (×2): 100 ug via INTRAVENOUS

## 2020-06-27 MED ORDER — LIDOCAINE HCL (CARDIAC) PF 100 MG/5ML IV SOSY
PREFILLED_SYRINGE | INTRAVENOUS | Status: DC | PRN
  Administered 2020-06-27: 100 mg via INTRAVENOUS

## 2020-06-27 MED ORDER — ONDANSETRON HCL 4 MG/2ML IJ SOLN
INTRAMUSCULAR | Status: DC | PRN
  Administered 2020-06-27: 4 mg via INTRAVENOUS

## 2020-06-27 MED ORDER — PROPOFOL 10 MG/ML IV BOLUS
INTRAVENOUS | Status: DC | PRN
  Administered 2020-06-27: 200 mg via INTRAVENOUS
  Administered 2020-06-27: 50 mg via INTRAVENOUS

## 2020-06-27 MED ORDER — MIDAZOLAM HCL 2 MG/2ML IJ SOLN
INTRAMUSCULAR | Status: DC | PRN
  Administered 2020-06-27: 2 mg via INTRAVENOUS

## 2020-06-27 MED ORDER — POVIDONE-IODINE 10 % EX SWAB
2.0000 "application " | Freq: Once | CUTANEOUS | Status: AC
  Administered 2020-06-27: 2 via TOPICAL

## 2020-06-27 MED ORDER — BUPIVACAINE HCL (PF) 0.5 % IJ SOLN
INTRAMUSCULAR | Status: AC
  Filled 2020-06-27: qty 30

## 2020-06-27 MED ORDER — NEOMYCIN-POLYMYXIN B GU 40-200000 IR SOLN
Status: AC
  Filled 2020-06-27: qty 2

## 2020-06-27 MED ORDER — SUCCINYLCHOLINE CHLORIDE 200 MG/10ML IV SOSY
PREFILLED_SYRINGE | INTRAVENOUS | Status: AC
  Filled 2020-06-27: qty 10

## 2020-06-27 MED ORDER — DEXAMETHASONE SODIUM PHOSPHATE 10 MG/ML IJ SOLN
INTRAMUSCULAR | Status: AC
  Filled 2020-06-27: qty 1

## 2020-06-27 MED ORDER — CHLORHEXIDINE GLUCONATE 4 % EX LIQD
60.0000 mL | Freq: Once | CUTANEOUS | Status: AC
  Administered 2020-06-27: 4 via TOPICAL

## 2020-06-27 MED ORDER — ROCURONIUM BROMIDE 100 MG/10ML IV SOLN
INTRAVENOUS | Status: DC | PRN
  Administered 2020-06-27: 5 mg via INTRAVENOUS

## 2020-06-27 MED ORDER — SEVOFLURANE IN SOLN
RESPIRATORY_TRACT | Status: AC
  Filled 2020-06-27: qty 250

## 2020-06-27 MED ORDER — BUPIVACAINE HCL 0.5 % IJ SOLN
INTRAMUSCULAR | Status: DC | PRN
  Administered 2020-06-27: 10 mL

## 2020-06-27 MED ORDER — PROPOFOL 10 MG/ML IV BOLUS
INTRAVENOUS | Status: AC
  Filled 2020-06-27: qty 20

## 2020-06-27 MED ORDER — FENTANYL CITRATE (PF) 100 MCG/2ML IJ SOLN
INTRAMUSCULAR | Status: DC | PRN
  Administered 2020-06-27: 100 ug via INTRAVENOUS
  Administered 2020-06-27 (×2): 50 ug via INTRAVENOUS

## 2020-06-27 MED ORDER — ENSURE PRE-SURGERY PO LIQD
296.0000 mL | Freq: Once | ORAL | Status: AC
  Administered 2020-06-27: 18:00:00 296 mL via ORAL
  Filled 2020-06-27: qty 296

## 2020-06-27 MED ORDER — ROCURONIUM BROMIDE 10 MG/ML (PF) SYRINGE
PREFILLED_SYRINGE | INTRAVENOUS | Status: AC
  Filled 2020-06-27: qty 10

## 2020-06-27 MED ORDER — LACTATED RINGERS IV SOLN: INTRAVENOUS | Status: DC | PRN

## 2020-06-27 MED ORDER — VANCOMYCIN HCL 1250 MG/250ML IV SOLN
1250.0000 mg | Freq: Two times a day (BID) | INTRAVENOUS | Status: DC
  Administered 2020-06-28 – 2020-06-29 (×3): 1250 mg via INTRAVENOUS
  Filled 2020-06-27 (×6): qty 250

## 2020-06-27 MED ORDER — FENTANYL CITRATE (PF) 100 MCG/2ML IJ SOLN
INTRAMUSCULAR | Status: AC
  Filled 2020-06-27: qty 2

## 2020-06-27 MED ORDER — MIDAZOLAM HCL 2 MG/2ML IJ SOLN
INTRAMUSCULAR | Status: AC
  Filled 2020-06-27: qty 2

## 2020-06-27 MED ORDER — SODIUM CHLORIDE 0.9 % IV SOLN
INTRAVENOUS | Status: DC | PRN
  Administered 2020-06-27: 50 ug/min via INTRAVENOUS

## 2020-06-27 MED ORDER — LIDOCAINE HCL (PF) 2 % IJ SOLN
INTRAMUSCULAR | Status: AC
  Filled 2020-06-27: qty 5

## 2020-06-27 MED ORDER — SUCCINYLCHOLINE CHLORIDE 20 MG/ML IJ SOLN
INTRAMUSCULAR | Status: DC | PRN
  Administered 2020-06-27: 120 mg via INTRAVENOUS

## 2020-06-27 MED ORDER — DEXAMETHASONE SODIUM PHOSPHATE 10 MG/ML IJ SOLN
INTRAMUSCULAR | Status: DC | PRN
  Administered 2020-06-27: 10 mg via INTRAVENOUS

## 2020-06-27 MED ORDER — NEOMYCIN-POLYMYXIN B GU 40-200000 IR SOLN
Status: DC | PRN
  Administered 2020-06-27: 2 mL

## 2020-06-27 MED ORDER — PHENYLEPHRINE HCL (PRESSORS) 10 MG/ML IV SOLN
INTRAVENOUS | Status: AC
  Filled 2020-06-27: qty 1

## 2020-06-27 SURGICAL SUPPLY — 55 items
BLADE MED AGGRESSIVE (BLADE) ×2 IMPLANT
BLADE OSC/SAGITTAL MD 5.5X18 (BLADE) ×1 IMPLANT
BLADE SURG 15 STRL LF DISP TIS (BLADE) IMPLANT
BLADE SURG 15 STRL SS (BLADE) ×2
BLADE SURG MINI STRL (BLADE) IMPLANT
BNDG CONFORM 2 STRL LF (GAUZE/BANDAGES/DRESSINGS) IMPLANT
BNDG ELASTIC 4X5.8 VLCR STR LF (GAUZE/BANDAGES/DRESSINGS) ×2 IMPLANT
BNDG ESMARK 4X12 TAN STRL LF (GAUZE/BANDAGES/DRESSINGS) ×2 IMPLANT
BNDG GAUZE 4.5X4.1 6PLY STRL (MISCELLANEOUS) ×2 IMPLANT
BOOT STEPPER DURA LG (SOFTGOODS) ×1 IMPLANT
CANISTER SUCT 1200ML W/VALVE (MISCELLANEOUS) ×2 IMPLANT
CNTNR SPEC 2.5X3XGRAD LEK (MISCELLANEOUS) ×1
CONT SPEC 4OZ STER OR WHT (MISCELLANEOUS) ×1
CONTAINER SPEC 2.5X3XGRAD LEK (MISCELLANEOUS) ×1 IMPLANT
COVER WAND RF STERILE (DRAPES) ×2 IMPLANT
CUFF TOURN 18 STER (MISCELLANEOUS) ×1 IMPLANT
CUFF TOURN DUAL PL 12 NO SLV (MISCELLANEOUS) IMPLANT
DRAPE FLUOR MINI C-ARM 54X84 (DRAPES) IMPLANT
DRSG EMULSION OIL 3X8 NADH (GAUZE/BANDAGES/DRESSINGS) ×1 IMPLANT
DURAPREP 26ML APPLICATOR (WOUND CARE) ×2 IMPLANT
ELECT REM PT RETURN 9FT ADLT (ELECTROSURGICAL) ×2
ELECTRODE REM PT RTRN 9FT ADLT (ELECTROSURGICAL) ×1 IMPLANT
GAUZE PACKING 1/4 X5 YD (GAUZE/BANDAGES/DRESSINGS) ×1 IMPLANT
GAUZE SPONGE 4X4 12PLY STRL (GAUZE/BANDAGES/DRESSINGS) ×4 IMPLANT
GAUZE XEROFORM 1X8 LF (GAUZE/BANDAGES/DRESSINGS) ×2 IMPLANT
GLOVE BIO SURGEON STRL SZ7 (GLOVE) ×2 IMPLANT
GLOVE INDICATOR 7.0 STRL GRN (GLOVE) ×2 IMPLANT
GOWN STRL REUS W/ TWL LRG LVL3 (GOWN DISPOSABLE) ×2 IMPLANT
GOWN STRL REUS W/TWL LRG LVL3 (GOWN DISPOSABLE) ×2
HANDPIECE VERSAJET DEBRIDEMENT (MISCELLANEOUS) ×1 IMPLANT
KIT STIMULAN RAPID CURE 5CC (Orthopedic Implant) ×1 IMPLANT
KIT TURNOVER KIT A (KITS) ×2 IMPLANT
LABEL OR SOLS (LABEL) ×2 IMPLANT
NDL FILTER BLUNT 18X1 1/2 (NEEDLE) ×1 IMPLANT
NDL HYPO 25X1 1.5 SAFETY (NEEDLE) ×2 IMPLANT
NEEDLE FILTER BLUNT 18X 1/2SAF (NEEDLE) ×1
NEEDLE FILTER BLUNT 18X1 1/2 (NEEDLE) ×1 IMPLANT
NEEDLE HYPO 25X1 1.5 SAFETY (NEEDLE) ×4 IMPLANT
NS IRRIG 500ML POUR BTL (IV SOLUTION) ×2 IMPLANT
PACK EXTREMITY (MISCELLANEOUS) ×2 IMPLANT
PAD ABD DERMACEA PRESS 5X9 (GAUZE/BANDAGES/DRESSINGS) ×2 IMPLANT
PULSAVAC PLUS IRRIG FAN TIP (DISPOSABLE) ×2
SOL .9 NS 3000ML IRR  AL (IV SOLUTION) ×1
SOL .9 NS 3000ML IRR UROMATIC (IV SOLUTION) IMPLANT
SOL PREP PVP 2OZ (MISCELLANEOUS) ×2
SOLUTION PREP PVP 2OZ (MISCELLANEOUS) ×1 IMPLANT
STOCKINETTE STRL 6IN 960660 (GAUZE/BANDAGES/DRESSINGS) ×2 IMPLANT
SUT ETHIBOND 2-0 (SUTURE) ×1 IMPLANT
SUT ETHILON 3-0 FS-10 30 BLK (SUTURE) ×8
SUT VIC AB 3-0 SH 27 (SUTURE) ×2
SUT VIC AB 3-0 SH 27X BRD (SUTURE) ×1 IMPLANT
SUTURE EHLN 3-0 FS-10 30 BLK (SUTURE) ×2 IMPLANT
SWAB CULTURE AMIES ANAERIB BLU (MISCELLANEOUS) IMPLANT
SYR 10ML LL (SYRINGE) ×2 IMPLANT
TIP FAN IRRIG PULSAVAC PLUS (DISPOSABLE) IMPLANT

## 2020-06-27 NOTE — Op Note (Addendum)
PODIATRY / FOOT AND ANKLE SURGERY OPERATIVE REPORT    SURGEON: Caroline More, DPM  PRE-OPERATIVE DIAGNOSIS: 1.  Osteomyelitis left fifth metatarsal base with associated abscess secondary to ulceration 2.  Diabetes type 2 with polyneuropathy 3.  PVD  POST-OPERATIVE DIAGNOSIS:   PROCEDURE(S): 1.  Left partial fifth metatarsal base amputation/resection 2.  Incision and drainage left foot 3.  Peroneus brevis to peroneus longus anastomosis  HEMOSTASIS: Left ankle tourniquet  ANESTHESIA: general  ESTIMATED BLOOD LOSS: 30 cc  FINDING(S): 1.  Abscess fifth metatarsal base left foot with extension about the lateral foot proximally 2.  Exposure of left fifth metatarsal base.  Once bone was resected that was exposed at the ulcerative site the bone appeared to be very hard with no evidence of osteomyelitis that remains.  PATHOLOGY/SPECIMEN(S): Left fifth metatarsal base bone biopsy and bone culture  INDICATIONS:   [name] is a 54 y.o. male who presents with a chronic nonhealing ulceration to the lateral aspect of the left foot at the fifth metatarsal base plantar medially.  Patient states he has had it for about a year or so and over the past 3 months it has been draining pus.  Patient noted over the past week his foot became more red hot and swollen and so he came into the emergency room where he was admitted for further work-up.  X-ray did not show any osteomyelitis but appeared to have emphysema present within the soft tissues.  Patient had an MRI performed showing early onset osteomyelitis of the fifth metatarsal base with associated abscess.  While in the ED podiatry was consulted and a bedside incision and drainage was performed releasing about 10 to 20 cc of purulence.  The fifth metatarsal base ulcer appeared to probe to bone at that time and there is concern for further infection at the peroneal tendon course.  Discussed all treatment options with the patient both conservative and  surgical attempts at correction include potential risks and complications.  At this time patient is elected for procedure the left foot consisting of incision and drainage left foot with partial fifth ray amputation with possible peroneal tendon transfer and application of antibiotic beads.  All questions answered including postoperative course in detail.  Patient declined to have Covid testing performed so had to perform the procedure as emergency using Covid precautions assuming patient was Covid positive.  DESCRIPTION: After obtaining full informed written consent, the patient was brought back to the operating room and placed supine upon the operating table.  The patient received IV antibiotics prior to induction.  After obtaining adequate anesthesia, a 10 cc block of 1% lidocaine plain was injected about the lateral ankle and tibial area the patient was prepped and draped in the standard fashion.  An Esmarch bandage was used to exsanguinate the left lower extremity pneumatic ankle tourniquet was inflated.  Attention was then directed to the lateral aspect of the left foot where the ulceration was circumferentially incised and incision was then extended at the fifth metatarsal base area and extending up the peroneal tendon course.  The incision was deepened to the subcutaneous tissues utilizing sharp blunt dissection care was taken to identify and retract all vital neurovascular structures and all venous contributories were cauterized as necessary.  There appeared to be a moderate amount of purulence that was expressed from the plantar fifth metatarsal base area that appeared to be extending proximally potentially around the peroneal tendon course.  The peroneal tendon sheath was then incised and the  peroneus brevis tendon as well as peroneus longus tendon were exposed.  No purulence was able to be expressed from these areas indicating that the abscess did not travel up the tendon course and was locally only  seen at the fifth metatarsal base level plantarly and laterally.  All nonviable necrotic tissue was resected at the operative site and passed off.  Versajet was used to clean up the tissues further.  The tissues appeared to be very tight consistent with typical diabetic neuropathic type tissue.  The fifth metatarsal base did appear to be exposed through the ulceration site so at this time a sagittal bone saw was used to resect the tuberosity of the base the fifth metatarsal as well as the plantar aspect in areas where bone was exposed and where subtle changes could be seen on the MRI for osteomyelitis.  The bone appeared to be very hard when resecting indicating likely that the bone was likely healthy at the point of resection.  Capillaries could be seen bleeding in the bone indicating healthy bleeding/minimal chance of infection to the area.  The bone was passed off in the operative site and the inner margin was marked in black ink and one chunk was sent off for bone biopsy and another was sent for bone culture.  The surgical site was then flushed with copious amounts normal sterile saline with pulse lavage.  A portion of the peroneus brevis tendon had to be resected in order to remove the tuberosity of the fifth metatarsal base.  At this time it was determined to perform a peroneus brevis to longus tenodesis proximal to the area of the abscess.  This was performed with 2-0 Ethibond.  This was done at the most proximal aspect of the incision where the tissues appeared to be most healthy.  This was done to strengthen the pole the peroneal tendons to reduce chances of varus contracture.  The surgical site was once again flushed with copious amounts normal sterile saline.  Antibiotic beads impregnated with vancomycin were then placed along the entirety of the operative site.  The deep structures and subcutaneous tissue were reapproximated well coapted with 3-0 Vicryl and the skin was then reapproximated well  coapted with 3-0 nylon.  Betadine soaked Iodosorb packing gauze was placed inside the wound.  An Adaptic sheet was then placed over the residual wound to the fifth metatarsal base area which could not be closed and sutured into place to cover the antibiotic beads.  The pneumatic ankle tourniquet was deflated and minimal bleeding occurred during suturing indicating likely vascular disease present still as was noted preoperatively when performing the bedside I&D.  A prompt hyperemic response was noted though to the digits of the left foot.  A postoperative dressing was then applied consisting of Betadine soaked 4 x 4 gauze to the incision line followed by 4 x 4's, ABD, Kerlix, Ace wrap.  Patient tolerated the procedure and anesthesia well and was treated in the operative area as if it were PACU per Covid protocol.  Once patient was stable he was transferred to the room with the appropriate orders.  Patient is to be nonweightbearing on the left lower extremity all times.  We will likely remove packing gauze tomorrow and then have wound VAC applied to the open wound area.  Vascular consultation has been placed.  COMPLICATIONS: None  CONDITION: Stable, good  Caroline More, DPM

## 2020-06-27 NOTE — Progress Notes (Signed)
Pharmacy Antibiotic Note  [name] is a 54 y.o. male admitted on 06/25/2020. Pharmacy has been consulted for vancomycin and Zosyn dosing. Patient with bedside I&D to left foot 7/17 with plan for amputation 7/18.  Plan: Renal function fluctuating, although not significantly reduced. Will change vancomycin back to 1250 mg q12h with dose tonight.  Zosyn 3.375 g IV q8h extended infusion  Continue to follow recommendations post-procedure.  Height: 5\' 11"  (180.3 cm) Weight: 94.5 kg (208 lb 5.4 oz) IBW/kg (Calculated) : 75.3  Temp (24hrs), Avg:98.5 F (36.9 C), Min:98.1 F (36.7 C), Max:98.8 F (37.1 C)  Recent Labs  Lab 06/25/20 1736 06/26/20 0458 06/27/20 0635  WBC 13.4* 12.8* 8.7  CREATININE 1.37* 1.15 1.26*    Estimated Creatinine Clearance: 79.6 mL/min (A) (by C-G formula based on SCr of 1.26 mg/dL (H)).    No Known Allergies  Antimicrobials this admission: Vancomycin 7/16 >> Zosyn 7/16 >>   Microbiology results: 7/16 Wound Cx: pending (GNR, GPC on gram stain)  Thank you for allowing pharmacy to be a part of this patient's care.  Tawnya Crook, PharmD 06/27/2020 9:53 AM

## 2020-06-27 NOTE — Progress Notes (Signed)
PROGRESS NOTE    [name]  YYQ:[id number] DOB: 07-27-1966 DOA: 06/25/2020 PCP: [name], MD    Chief Complaint  Patient presents with  . Foot Pain  . Leg Pain    Brief Narrative:  54 year old with known history of diabetes, hypertension, hyperlipidemia, peripheral neuropathy presents with a nonhealing left foot ulcer on 06/25/2020.  On arrival he was afebrile, slightly hypertensive with right foot pain.  MRI of the left foot shows large area of ulceration over the lateral base of the fifth metatarsal with a sinus tract, subcutaneous emphysema and a loculated abscess along with findings suggestive of early osteomyelitis involving the fifth metatarsal bone. Podiatry consulted and he is scheduled for incision and drainage with washout with fifth metatarsal base debridement tomorrow.  He will ultimately need vascular surgery consultation for potential optimization of flow.  He was started on broad-spectrum IV antibiotics, wound culture sent for analysis and admitted to [facility] service.  Assessment & Plan:   Active Problems:   Type 2 diabetes mellitus with hyperlipidemia (HCC)   Diabetic foot ulcer (HCC)   Osteomyelitis of the fifth metatarsal bone and left foot abscess with surrounding cellulitis Initially started on broad-spectrum IV antibiotics, continue the same follow wound cultures and narrow the antibiotics. Appreciate podiatry recommendations plan for incision and drainage with washout this afternoon.  Pain control. Podiatry recommends nonweightbearing after the procedure of the left lower extremity. Patient is afebrile and leukocytosis improving with IV antibiotics. CRP elevated at 15.5, sed rate at 68  Uncontrolled diabetes mellitus with hyperglycemia with peripheral neuropathy Hemoglobin A1c at 8. CBG (last 3)  Recent Labs    06/26/20 2038 06/27/20 0752 06/27/20 1139  GLUCAP 215* 208* 147*   Continue with sliding scale insulin. Uncontrolled. Will add novolog 3  units TIDAC.     Hypertensive urgency/uncontrolled hypertension Resolved. BP parameters are well controlled    Hypokalemia Replaced.    Hyperlipidemia Continue with statin.    AKI probably secondary to ATN from infection. Creatinine improved with IV fluids. Repeat creatinine at 1.2 this morning   Leukocytosis WBC count this morning within normal limits    Mild normocytic anemia Continue to monitor. Hemoglobin stable around 9   Patient continues to refuse COVID-19 testing.   DVT prophylaxis: LOVENOX.  Code Status: Full code Family Communication: none at bedside.  Disposition:   Status is: Inpatient  Remains inpatient appropriate because:Ongoing diagnostic testing needed not appropriate for outpatient work up, IV treatments appropriate due to intensity of illness or inability to take PO and Inpatient level of care appropriate due to severity of illness   Dispo: The patient is from: Home              Anticipated d/c is to: Home              Anticipated d/c date is: 3 days              Patient currently is not medically stable to d/c.       Consultants:   Podiatry Dr Luana Shu.   Procedures: none.  Antimicrobials:  Anti-infectives (From admission, onward)   Start     Dose/Rate Route Frequency Ordered Stop   06/27/20 1800  vancomycin (VANCOREADY) IVPB 1250 mg/250 mL     Discontinue     1,250 mg 166.7 mL/hr over 90 Minutes Intravenous Every 12 hours 06/27/20 0952     06/26/20 2000  vancomycin (VANCOCIN) IVPB 1000 mg/200 mL premix  Status:  Discontinued  1,000 mg 200 mL/hr over 60 Minutes Intravenous Every 8 hours 06/26/20 1350 06/27/20 0952   06/26/20 1000  vancomycin (VANCOREADY) IVPB 1250 mg/250 mL  Status:  Discontinued        1,250 mg 166.7 mL/hr over 90 Minutes Intravenous Every 12 hours 06/25/20 2024 06/26/20 1350   06/26/20 0600  piperacillin-tazobactam (ZOSYN) IVPB 3.375 g     Discontinue     3.375 g 12.5 mL/hr over 240 Minutes Intravenous  Every 8 hours 06/25/20 2024     06/25/20 2100  vancomycin (VANCOREADY) IVPB 2000 mg/400 mL        2,000 mg 200 mL/hr over 120 Minutes Intravenous  Once 06/25/20 2024 06/25/20 2259   06/25/20 1900  piperacillin-tazobactam (ZOSYN) IVPB 4.5 g  Status:  Discontinued        4.5 g 200 mL/hr over 30 Minutes Intravenous  Once 06/25/20 1832 06/25/20 1837   06/25/20 1900  piperacillin-tazobactam (ZOSYN) IVPB 2.25 g        2.25 g 100 mL/hr over 30 Minutes Intravenous Every 30 min 06/25/20 1837 06/25/20 2023   06/25/20 1815  piperacillin-tazobactam (ZOSYN) IVPB 4.5 g  Status:  Discontinued        4.5 g 200 mL/hr over 30 Minutes Intravenous  Once 06/25/20 1801 06/25/20 1837         Subjective: Patient reports his foot pain has improved, no new complaints  Objective: Vitals:   06/26/20 2040 06/27/20 0059 06/27/20 0753 06/27/20 1143  BP: 129/65 120/61 118/78 119/74  Pulse: 86 76 70 68  Resp: 16 16 14 14   Temp: 98.7 F (37.1 C) 98.1 F (36.7 C) 98.8 F (37.1 C) 98.6 F (37 C)  TempSrc: Oral     SpO2: 95% 98% 98% 97%  Weight:      Height:        Intake/Output Summary (Last 24 hours) at 06/27/2020 1426 Last data filed at 06/27/2020 0600 Gross per 24 hour  Intake 700 ml  Output --  Net 700 ml   Filed Weights   06/25/20 1629  Weight: 94.5 kg    Examination:   General exam: Alert and comfortable, not in any kind of distress Respiratory system: Clear to auscultation bilaterally, no wheezing or rhonchi Cardiovascular system: S1-S2 heard, regular rate rhythm, no JVD, no pedal edema Gastrointestinal system: Abdomen is soft, nontender, nondistended, bowel sounds normal Central nervous system: Alert and oriented, grossly nonfocal Extremities: Left foot erythema with a draining ulcer at the lateral aspect of the fifth metatarsal base Skin: see above. Psychiatry: Mood is appropriate    Data Reviewed: I have personally reviewed following labs and imaging studies  CBC: Recent Labs   Lab 06/25/20 1736 06/26/20 0458 06/27/20 0635  WBC 13.4* 12.8* 8.7  NEUTROABS 11.0*  --   --   HGB 12.4* 11.6* 11.7*  HCT 35.4* 34.9* 34.4*  MCV 89.6 93.6 92.7  PLT 250 246 295    Basic Metabolic Panel: Recent Labs  Lab 06/25/20 1736 06/26/20 0458 06/27/20 0635  NA 134* 136 137  K 3.5 3.3* 3.9  CL 98 100 104  CO2 20* 19* 23  GLUCOSE 187* 103* 207*  BUN 37* 29* 32*  CREATININE 1.37* 1.15 1.26*  CALCIUM 9.2 9.0 9.1    GFR: Estimated Creatinine Clearance: 79.6 mL/min (A) (by C-G formula based on SCr of 1.26 mg/dL (H)).  Liver Function Tests: Recent Labs  Lab 06/25/20 1736  AST 14*  ALT 16  ALKPHOS 62  BILITOT 2.5*  PROT 8.0  ALBUMIN 3.6    CBG: Recent Labs  Lab 06/26/20 1013 06/26/20 1639 06/26/20 2038 06/27/20 0752 06/27/20 1139  GLUCAP 191* 246* 215* 208* 147*     Recent Results (from the past 240 hour(s))  Aerobic/Anaerobic Culture (surgical/deep wound)     Status: None (Preliminary result)   Collection Time: 06/25/20  7:08 PM   Specimen: Foot; Tissue  Result Value Ref Range Status   Specimen Description   Final    FOOT Performed at [facility], [address] [place], [place] [address]    Special Requests   Final    Normal Performed at [facility], [place], [place] [address]    Gram Stain   Final    RARE WBC PRESENT, PREDOMINANTLY PMN ABUNDANT GRAM NEGATIVE RODS ABUNDANT GRAM POSITIVE COCCI IN CHAINS IN CLUSTERS    Culture   Final    CULTURE REINCUBATED FOR BETTER GROWTH Performed at [facility], [place] [address] [place], [place] [address]    Report Status PENDING  Incomplete         Radiology Studies: MR FOOT LEFT WO CONTRAST  Result Date: 06/25/2020 CLINICAL DATA:  Foot ulcer, question of osteomyelitis EXAM: MRI OF THE LEFT FOOT WITHOUT CONTRAST TECHNIQUE: Multiplanar, multisequence MR imaging of the left was performed. No intravenous contrast was administered. COMPARISON:   Radiograph same day FINDINGS: Bones/Joint/Cartilage There is a large area of ulceration seen overlying the lateral base of the fifth metatarsal with subcutaneous emphysema and a sinus tract to the underlying osseous surface. There is a focal loculated fluid collections seen along the lateral surface measuring approximately 2.2 x 0.9 x 3.0 cm. There is increased T2 marrow signal seen at the lateral base of the fifth metatarsal with subtly increased T1 hypointensity. There is also mildly increased T2 hyperintense signal seen within the cuboid, lateral cuneiform, lateral aspect of the anterior calcaneus without T1 hypointensity. No fracture or areas of cortical destruction however are noted. Ligaments Lisfranc ligaments are intact. Muscles and Tendons Diffusely increased feathery signal seen within the muscles surrounding the forefoot. The flexor and extensor tendons are intact. Soft tissues As described above large area of ulceration seen over the lateral aspect of the fifth metatarsal base with subcutaneous emphysema and a sinus tract. There is diffuse subcutaneous edema and skin thickening. A loculated fluid collection is also noted along the lateral base. IMPRESSION: 1. Large area of ulceration over the lateral base the fifth metatarsal with a sinus tract, subcutaneous emphysema and a loculated abscess as described above. 2. Findings suggestive of early osteomyelitis involving the fifth metatarsal base. 3. Mild reactive marrow seen within the anterior calcaneus, cuboid, and lateral cuneiform. Electronically Signed   By: Prudencio Pair M.D.   On: 06/25/2020 23:05   DG Foot Complete Left  Result Date: 06/25/2020 CLINICAL DATA:  Pain, diabetic foot ulcer EXAM: LEFT FOOT - COMPLETE 3+ VIEW COMPARISON:  None. FINDINGS: There is no evidence of fracture or dislocation. No areas of cortical destruction or periosteal reaction. There is a focal area of ulceration with subcutaneous emphysema and soft tissue swelling seen  over the lateral base of the fifth metatarsal. IMPRESSION: Area of ulceration overlying the fifth metatarsal base. No definite evidence of osteomyelitis. Electronically Signed   By: Prudencio Pair M.D.   On: 06/25/2020 18:13        Scheduled Meds: . amLODipine  10 mg Oral Daily  . aspirin EC  81 mg Oral Daily  . enoxaparin (LOVENOX)  injection  40 mg Subcutaneous Q24H  . irbesartan  300 mg Oral Daily   And  . hydrochlorothiazide  25 mg Oral Daily  . insulin aspart  0-5 Units Subcutaneous QHS  . insulin aspart  0-9 Units Subcutaneous TID WC  . rosuvastatin  40 mg Oral Daily   Continuous Infusions: . piperacillin-tazobactam (ZOSYN)  IV 3.375 g (06/27/20 1203)  . vancomycin       LOS: 2 days        Hosie Poisson, MD [facility]   To contact the attending provider between 7A-7P or the covering provider during after hours 7P-7A, please log into the web site [email or web address] and access using universal [place] password for that web site. If you do not have the password, please call the hospital operator.  06/27/2020, 2:26 PM

## 2020-06-27 NOTE — Anesthesia Procedure Notes (Addendum)
Procedure Name: Intubation Date/Time: 06/27/2020 4:14 PM Performed by: Esaw Grandchild, CRNA Pre-anesthesia Checklist: Patient identified, Emergency Drugs available, Suction available and Patient being monitored Patient Re-evaluated:Patient Re-evaluated prior to induction Oxygen Delivery Method: Circle system utilized Preoxygenation: Pre-oxygenation with 100% oxygen Induction Type: IV induction Ventilation: Mask ventilation without difficulty Laryngoscope Size: Miller and 2 Grade View: Grade I Tube type: Oral Tube size: 8.0 mm Number of attempts: 1 Airway Equipment and Method: Stylet,  Oral airway and Bite block Placement Confirmation: ETT inserted through vocal cords under direct vision,  positive ETCO2 and breath sounds checked- equal and bilateral Secured at: 23 cm Tube secured with: Tape Dental Injury: Teeth and Oropharynx as per pre-operative assessment

## 2020-06-27 NOTE — H&P (Addendum)
HISTORY AND PHYSICAL INTERVAL NOTE:  06/27/2020  7:59 AM  [name]  has presented today for surgery, with the diagnosis of left foot infection.  The various methods of treatment have been discussed with the patient.  No guarantees were given.  After consideration of risks, benefits and other options for treatment, the patient has consented to surgery.  I have reviewed the patients' chart and labs.    PROCEDURE: LEFT FOOT PARTIAL 5TH RAY AMPUTATION WITH BONE BIOPSY AND INCISION AND DRAINAGE  Patient continues to decline covid testing due to personal preference.  Discussed importance in detail and patient still declines despite policy.  Discussed with patient that in order to potentially salvage his left foot, then we will be required to perform procedure as a medical emergency using COVID protocols to ensure safety of staff and patient.  Patient understands and would like to proceed.  A history and physical examination was performed in hospital.  The patient was reexamined.  There have been no changes to this history and physical examination.  Surgery had to be moved to end of day due to [place] when treating presumed covid+ patient  Caroline More, DPM

## 2020-06-27 NOTE — Transfer of Care (Signed)
Immediate Anesthesia Transfer of Care Note  Patient: [name]  Procedure(s) Performed: INCISION AND DRAINAGE (Left ) BONE BIOPSY (Left )  Patient Location: Nursing Unit  Anesthesia Type:General  Level of Consciousness: awake, alert  and oriented  Airway & Oxygen Therapy: Patient Spontanous Breathing  Post-op Assessment: Report given to RN, Post -op Vital signs reviewed and stable and Patient moving all extremities X 4  Post vital signs: Reviewed and stable  Last Vitals:  Vitals Value Taken Time  BP    Temp    Pulse    Resp    SpO2      Last Pain:  Vitals:   06/27/20 0710  TempSrc:   PainSc: 0-No pain         Complications: No complications documented.

## 2020-06-27 NOTE — Anesthesia Postprocedure Evaluation (Signed)
Anesthesia Post Note  Patient: [name]  Procedure(s) Performed: INCISION AND DRAINAGE (Left ) BONE BIOPSY (Left )  Patient location during evaluation: Other (OR) Anesthesia Type: General Level of consciousness: awake and alert Pain management: pain level controlled Vital Signs Assessment: post-procedure vital signs reviewed and stable Respiratory status: spontaneous breathing, nonlabored ventilation, respiratory function stable and patient connected to nasal cannula oxygen Cardiovascular status: blood pressure returned to baseline and stable Postop Assessment: no apparent nausea or vomiting Anesthetic complications: no Comments: Patient was recovered in the OR by the CRNA due to his refusal to have a COVID test and thus being treated as positive.   No complications documented.   Last Vitals:  Vitals:   06/27/20 0753 06/27/20 1143  BP: 118/78 119/74  Pulse: 70 68  Resp: 14 14  Temp: 37.1 C 37 C  SpO2: 98% 97%    Last Pain:  Vitals:   06/27/20 0710  TempSrc:   PainSc: 0-No pain                 Martha Clan

## 2020-06-28 ENCOUNTER — Encounter: Payer: Self-pay | Admitting: Podiatry

## 2020-06-28 DIAGNOSIS — L97404 Non-pressure chronic ulcer of unspecified heel and midfoot with necrosis of bone: Secondary | ICD-10-CM

## 2020-06-28 LAB — GLUCOSE, CAPILLARY
Glucose-Capillary: 215 mg/dL — ABNORMAL HIGH (ref 70–99)
Glucose-Capillary: 253 mg/dL — ABNORMAL HIGH (ref 70–99)
Glucose-Capillary: 223 mg/dL — ABNORMAL HIGH (ref 70–99)
Glucose-Capillary: 144 mg/dL — ABNORMAL HIGH (ref 70–99)

## 2020-06-28 LAB — CREATININE, SERUM
Creatinine, Ser: 1.29 mg/dL — ABNORMAL HIGH (ref 0.61–1.24)
GFR calc Af Amer: >60 mL/min (ref >60)
GFR calc non Af Amer: >60 mL/min (ref >60)

## 2020-06-28 MED ORDER — INSULIN ASPART 100 UNIT/ML ~~LOC~~ SOLN
3.0000 [IU] | Freq: Three times a day (TID) | SUBCUTANEOUS | Status: DC
  Administered 2020-06-28 – 2020-07-01 (×8): 3 [IU] via SUBCUTANEOUS
  Filled 2020-06-28 (×8): qty 1

## 2020-06-28 MED ORDER — INSULIN GLARGINE 100 UNIT/ML ~~LOC~~ SOLN
10.0000 [IU] | Freq: Every day | SUBCUTANEOUS | Status: DC
  Administered 2020-06-28 – 2020-07-01 (×4): 10 [IU] via SUBCUTANEOUS
  Filled 2020-06-28 (×5): qty 0.1

## 2020-06-28 MED ORDER — INSULIN ASPART 100 UNIT/ML ~~LOC~~ SOLN
0.0000 [IU] | Freq: Three times a day (TID) | SUBCUTANEOUS | Status: DC
  Administered 2020-06-28: 5 [IU] via SUBCUTANEOUS
  Administered 2020-06-29: 2 [IU] via SUBCUTANEOUS
  Administered 2020-06-29 (×2): 3 [IU] via SUBCUTANEOUS
  Administered 2020-06-30 (×2): 2 [IU] via SUBCUTANEOUS
  Administered 2020-07-01: 3 [IU] via SUBCUTANEOUS
  Administered 2020-07-01: 2 [IU] via SUBCUTANEOUS
  Filled 2020-06-28 (×8): qty 1

## 2020-06-28 NOTE — Evaluation (Signed)
Physical Therapy Evaluation Patient Details Name: [name] MRN: [id number] DOB: [birth year] Today's Date: 06/28/2020   History of Present Illness  presented to ER secondary to L LE pain, increased drainage from L LE ulceration; admitted for management of diabetic foot ulceration.  s/p L partial 5th ray amputation/resection, irrigation and debridement L foot, peroneal tendon anastomoses with antibiotic bead placement (06/27/20)  Clinical Impression  Upon evaluation, patient alert and oriented; follows commands and demonstrates good effort with mobility tasks. Multiple attempts at educating and reinforcing re: NWB L LE required throughout session, as patient feels he can put "just his toe" on the ground.  Eventually understands and complies with NWB; feel patient likely to need continued reinforcement in subsequent sessions.  Able to complete bed mobility indep; sit/stand, basic transfers and gait (30' x2) with RW, close sup.  Requires consistent cuing for NWB L LE and for control/decreased force of contact R LE; generally impulsive, frequent redirection to task and safety needs.  Encouraged patient to have family member bring footwear for R LE to maintain integrity of R foot with mobility tasks.  May plan to trial knee scooter next session.   Would benefit from skilled PT to address above deficits and promote optimal return to PLOF; Recommend transition to [place] upon discharge from acute hospitalization.     Follow Up Recommendations Home health PT    Equipment Recommendations  Rolling walker with 5" wheels;3in1 (PT)    Recommendations for Other Services       Precautions / Restrictions Restrictions Weight Bearing Restrictions: Yes LLE Weight Bearing: Non weight bearing Other Position/Activity Restrictions: CAM boot to L LE      Mobility  Bed Mobility Overal bed mobility: Independent                Transfers Overall transfer level: Needs assistance Equipment used: Rolling  walker (2 wheeled) Transfers: Sit to/from Stand Sit to Stand: Min guard;Supervision         General transfer comment: cuing for hand placement, NWB L LE with movement transition  Ambulation/Gait Ambulation/Gait assistance: Supervision Gait Distance (Feet):  (30' x2) Assistive device: Rolling walker (2 wheeled)       General Gait Details: consistent cuing for NWB L LE and for control/decreased force of contact R LE; generally impulsive, frequent redirection to task and safety needs  Stairs            Wheelchair Mobility    Modified Rankin (Stroke Patients Only)       Balance Overall balance assessment: Needs assistance Sitting-balance support: No upper extremity supported;Feet supported Sitting balance-Leahy Scale: Good     Standing balance support: Bilateral upper extremity supported Standing balance-Leahy Scale: Fair                               Pertinent Vitals/Pain Pain Assessment: No/denies pain    Home Living Family/patient expects to be discharged to:: Private residence   Available Help at Discharge: Family;Available PRN/intermittently Type of Home: House Home Access: Stairs to enter   [employer] of Steps: "a few", does not quantify Home Layout: One level Home Equipment: None      Prior Function Level of Independence: Independent         Comments: Indep with ADLs, household and community mobilization     Hand Dominance        Extremity/Trunk Assessment   Upper Extremity Assessment Upper Extremity Assessment: Overall WFL for tasks  assessed    Lower Extremity Assessment Lower Extremity Assessment: Overall WFL for tasks assessed (L ankle immobilized in CAM boot)       Communication   Communication: No difficulties  Cognition Arousal/Alertness: Awake/alert Behavior During Therapy: WFL for tasks assessed/performed Overall Cognitive Status: Within Functional Limits for tasks assessed                                         General Comments      Exercises Other Exercises Other Exercises: Educated in role of PT and progressive mobility, NWB restrictions to L LE, recommendations for donning/doffing/wearing of CAM boot.  Patient voiced understanding; do recommend reinforcement.   Assessment/Plan    PT Assessment Patient needs continued PT services  PT Problem List Decreased range of motion;Decreased activity tolerance;Decreased balance;Decreased mobility;Decreased knowledge of use of DME;Decreased safety awareness;Decreased knowledge of precautions;Pain;Decreased skin integrity       PT Treatment Interventions DME instruction;Gait training;Stair training;Functional mobility training;Therapeutic activities;Therapeutic exercise;Balance training;Patient/family education;Cognitive remediation    PT Goals (Current goals can be found in the Care Plan section)  Acute Rehab PT Goals Patient Stated Goal: agreeable to session PT Goal Formulation: With patient Time For Goal Achievement: 07/12/20 Potential to Achieve Goals: Good    Frequency Min 2X/week   Barriers to discharge        Co-evaluation               AM-PAC PT "6 Clicks" Mobility  Outcome Measure Help needed turning from your back to your side while in a flat bed without using bedrails?: None Help needed moving from lying on your back to sitting on the side of a flat bed without using bedrails?: None Help needed moving to and from a bed to a chair (including a wheelchair)?: None Help needed standing up from a chair using your arms (e.g., wheelchair or bedside chair)?: None Help needed to walk in hospital room?: A Little Help needed climbing 3-5 steps with a railing? : A Little 6 Click Score: 22    End of Session Equipment Utilized During Treatment: Gait belt Activity Tolerance: Patient tolerated treatment well Patient left: in bed;with call bell/phone within reach Nurse Communication: Mobility status PT  Visit Diagnosis: Muscle weakness (generalized) (M62.81);Difficulty in walking, not elsewhere classified (R26.2)    Time: 1351-1416 PT Time Calculation (min) (ACUTE ONLY): 25 min   Charges:   PT Evaluation $PT Eval Moderate Complexity: 1 Mod PT Treatments $Therapeutic Activity: 8-22 mins        Kadejah Sandiford H. Owens Shark, PT, DPT, NCS 06/28/20, 4:24 PM [phone]

## 2020-06-28 NOTE — Consult Note (Signed)
[facility] [place]  Patient name: [name] MRN: [id number] DOB: 1966/08/03 Sex: male   REQUESTING PROVIDER:   Dr. Luana Shu   REASON FOR CONSULT:    Evaluate blood flow to left foot  HISTORY OF PRESENT ILLNESS:   [name] is a 54 y.o. male, who earlier today went to the operating room with Dr. Luana Shu for a left foot infection.  He underwent partial fifth ray amputation with drainage of a abscess.  At that time, inadequate circulation was suspected and so vascular was consulted.  The patient is a poorly controlled diabetic.  He is medically managed for hypertension.  He takes a statin for hypercholesterolemia.  He is a non-smoker.  PAST MEDICAL HISTORY    Past Medical History:  Diagnosis Date  . Diabetes mellitus without complication ([place])   . Hyperlipidemia   . Hypertension      FAMILY HISTORY   Family History  Problem Relation Age of Onset  . Heart disease Father   . Heart attack Father   . Healthy Mother   . Prostate cancer Neg Hx   . Kidney cancer Neg Hx   . Bladder Cancer Neg Hx     SOCIAL HISTORY:   Social History   Socioeconomic History  . Marital status: Married    Spouse name: Not on file  . Number of children: 1  . Years of education: Not on file  . Highest education level: Not on file  Occupational History  . Not on file  Tobacco Use  . Smoking status: Never Smoker  . Smokeless tobacco: Never Used  Vaping Use  . Vaping Use: Never used  Substance and Sexual Activity  . Alcohol use: Yes    Alcohol/week: 6.0 - 12.0 standard drinks    Types: 6 - 12 Cans of beer per week    Comment: Occasional  . Drug use: No  . Sexual activity: Yes  Other Topics Concern  . Not on file  Social History Narrative  . Not on file   Social Determinants of Health   Financial Resource Strain: Low Risk   . Difficulty of Paying Living Expenses: Not hard at all  Food Insecurity: No Food Insecurity    . Worried About Charity fundraiser in the Last Year: Never true  . Ran Out of Food in the Last Year: Never true  Transportation Needs: No Transportation Needs  . Lack of Transportation (Medical): No  . Lack of Transportation (Non-Medical): No  Physical Activity: Insufficiently Active  . Days of Exercise per Week: 4 days  . Minutes of Exercise per Session: 30 min  Stress: No Stress Concern Present  . Feeling of Stress : Not at all  Social Connections:   . Frequency of Communication with Friends and Family:   . Frequency of Social Gatherings with Friends and Family:   . Attends Religious Services:   . Active Member of Clubs or Organizations:   . Attends Archivist Meetings:   Marland Kitchen Marital Status:   Intimate Partner Violence: Not At Risk  . Fear of Current or Ex-Partner: No  . Emotionally Abused: No  . Physically Abused: No  . Sexually Abused: No    ALLERGIES:    No Known Allergies  CURRENT MEDICATIONS:    Current Facility-Administered Medications  Medication Dose Route Frequency Provider Last Rate Last Admin  . acetaminophen (TYLENOL) tablet 650 mg  650 mg Oral Q6H PRN Caroline More, DPM  Or  . acetaminophen (TYLENOL) suppository 650 mg  650 mg Rectal Q6H PRN Caroline More, DPM      . amLODipine (NORVASC) tablet 10 mg  10 mg Oral Daily Caroline More, DPM   10 mg at 06/27/20 5638  . aspirin EC tablet 81 mg  81 mg Oral Daily Caroline More, DPM   81 mg at 06/27/20 0839  . enoxaparin (LOVENOX) injection 40 mg  40 mg Subcutaneous Q24H Caroline More, DPM   40 mg at 06/27/20 2152  . hydrALAZINE (APRESOLINE) tablet 25 mg  25 mg Oral Q8H PRN Caroline More, DPM      . irbesartan (AVAPRO) tablet 300 mg  300 mg Oral Daily Caroline More, DPM   300 mg at 06/27/20 0840   And  . hydrochlorothiazide (HYDRODIURIL) tablet 25 mg  25 mg Oral Daily Caroline More, DPM   25 mg at 06/27/20 0843  . HYDROcodone-acetaminophen (NORCO/VICODIN) 5-325 MG per tablet 1 tablet  1 tablet Oral  Q6H PRN Caroline More, DPM      . insulin aspart (novoLOG) injection 0-5 Units  0-5 Units Subcutaneous QHS Caroline More, DPM   4 Units at 06/27/20 2152  . insulin aspart (novoLOG) injection 0-9 Units  0-9 Units Subcutaneous TID WC Caroline More, DPM   1 Units at 06/27/20 1202  . ondansetron (ZOFRAN) tablet 4 mg  4 mg Oral Q6H PRN Caroline More, DPM       Or  . ondansetron [facility]) injection 4 mg  4 mg Intravenous Q6H PRN Caroline More, DPM      . piperacillin-tazobactam (ZOSYN) IVPB 3.375 g  3.375 g Intravenous Q8H Caroline More, DPM 12.5 mL/hr at 06/27/20 2151 3.375 g at 06/27/20 2151  . rosuvastatin (CRESTOR) tablet 40 mg  40 mg Oral Daily Caroline More, DPM   40 mg at 06/27/20 0842  . vancomycin (VANCOREADY) IVPB 1250 mg/250 mL  1,250 mg Intravenous Q12H Caroline More, DPM        REVIEW OF SYSTEMS:   [X]  denotes positive finding, [ ]  denotes negative finding Cardiac  Comments:  Chest pain or chest pressure:    Shortness of breath upon exertion:    Short of breath when lying flat:    Irregular heart rhythm:        Vascular    Pain in calf, thigh, or hip brought on by ambulation:    Pain in feet at night that wakes you up from your sleep:     Blood clot in your veins:    Leg swelling:         Pulmonary    Oxygen at home:    Productive cough:     Wheezing:         Neurologic    Sudden weakness in arms or legs:     Sudden numbness in arms or legs:     Sudden onset of difficulty speaking or slurred speech:    Temporary loss of vision in one eye:     Problems with dizziness:         Gastrointestinal    Blood in stool:      Vomited blood:         Genitourinary    Burning when urinating:     Blood in urine:        Psychiatric    Major depression:         Hematologic    Bleeding problems:    Problems with blood clotting too easily:  Skin    Rashes or ulcers:        Constitutional    Fever or chills:     PHYSICAL EXAM:   Vitals:   06/27/20 0059 06/27/20  0753 06/27/20 1143 06/27/20 2027  BP: 120/61 118/78 119/74 90/70  Pulse: 76 70 68 73  Resp: 16 14 14 16   Temp: 98.1 F (36.7 C) 98.8 F (37.1 C) 98.6 F (37 C) 98.1 F (36.7 C)  TempSrc:    Oral  SpO2: 98% 98% 97% 97%  Weight:      Height:        GENERAL: The patient is a well-nourished male, in no acute distress. The vital signs are documented above. CARDIAC: There is a regular rate and rhythm.  VASCULAR: Palpable femoral pulses bilaterally. PULMONARY: Nonlabored respirations ABDOMEN: Soft and non-tender with normal pitched bowel sounds.  MUSCULOSKELETAL: There are no major deformities or cyanosis. NEUROLOGIC: No focal weakness or paresthesias are detected. SKIN: Surgical dressing to the left foot PSYCHIATRIC: The patient has a normal affect.  STUDIES:   I have reviewed his foot MRI with the following findings: 1. Large area of ulceration over the lateral base the fifth metatarsal with a sinus tract, subcutaneous emphysema and a loculated abscess as described above. 2. Findings suggestive of early osteomyelitis involving the fifth metatarsal base. 3. Mild reactive marrow seen within the anterior calcaneus, cuboid, and lateral cuneiform.  ASSESSMENT and PLAN   Diabetic foot ulcer: Inadequate circulation was suspected at the time of operation.  The patient will need this further evaluated with angiography.  I discussed this with the patient.  This will be done later in the week as tomorrow schedule is full.  The patient is currently refusing Covid testing.  Hopefully he will consent, so that the procedure will not need to be done under full precautions.  This was discussed with the patient.   Leia Alf, MD, FACS [facility] [phone] Pager [phone]

## 2020-06-28 NOTE — Progress Notes (Signed)
Inpatient Diabetes Program Recommendations  AACE/ADA: New Consensus Statement on Inpatient Glycemic Control (2015)  Target Ranges:  Prepandial:   less than 140 mg/dL      Peak postprandial:   less than 180 mg/dL (1-2 hours)      Critically ill patients:  140 - 180 mg/dL   Lab Results  Component Value Date   GLUCAP 215 (H) 06/28/2020   HGBA1C 8.0 (H) 06/25/2020    Review of Glycemic Control Results for [name], [name] (MRN [id number]) as of 06/28/2020 09:51  Ref. Range 06/26/2020 20:38 06/27/2020 07:52 06/27/2020 11:39 06/27/2020 20:27 06/28/2020 08:40  Glucose-Capillary Latest Ref Range: 70 - 99 mg/dL 215 (H) 208 (H) 147 (H) 315 (H) 215 (H)   Diabetes history: DM2 Outpatient Diabetes medications:  Jardiance 25 mg q AM, Glucotrol 10 mg q AM Current orders for Inpatient glycemic control:  Novolog sensitive tid with meals Inpatient Diabetes Program Recommendations:    While in the hospital, consider adding Lantus 15 units daily for glycemic control.   Thanks  Adah Perl, RN, BC-ADM Inpatient Diabetes Coordinator Pager [phone] (8a-5p)

## 2020-06-28 NOTE — Progress Notes (Signed)
PROGRESS NOTE    [name]  TMH:[id number] DOB: 1966/08/29 DOA: 06/25/2020 PCP: [name], MD    Chief Complaint  Patient presents with  . Foot Pain  . Leg Pain    Brief Narrative:  54 year old with known history of diabetes, hypertension, hyperlipidemia, peripheral neuropathy presents with a nonhealing left foot ulcer on 06/25/2020.  On arrival he was afebrile, slightly hypertensive with right foot pain.  MRI of the left foot shows large area of ulceration over the lateral base of the fifth metatarsal with a sinus tract, subcutaneous emphysema and a loculated abscess along with findings suggestive of early osteomyelitis involving the fifth metatarsal bone. Podiatry consulted and he is scheduled for incision and drainage with washout with fifth metatarsal base debridement tomorrow.  He will ultimately need vascular surgery consultation for potential optimization of flow.  He was started on broad-spectrum IV antibiotics, wound culture sent for analysis and admitted to [facility] service. Patient underwent left partial fifth metatarsal base amputation/resection, incision and drainage of the left foot, peroneus brevis to peroneus longus anastomosis. Physical therapy evaluation ordered.  Assessment & Plan:   Active Problems:   Type 2 diabetes mellitus with hyperlipidemia (HCC)   Diabetic foot ulcer (HCC)   Osteomyelitis of the fifth metatarsal bone and left foot abscess with surrounding cellulitis Initially started on broad-spectrum IV antibiotics, continue the same follow wound cultures and narrow the antibiotics. Appreciate podiatry recommendations , underwent left partial fifth metatarsal base amputation/resection, incision and drainage of the left foot, peroneus brevis to peroneus longus anastomosis. Pain control. Podiatry recommends nonweightbearing after the procedure of the left lower extremity. Patient remains afebrile and WBC count within normal limits. CRP elevated at 15.5,  sed rate at 68 Vascular surgery consulted for evaluation of angiography and inadequate circulation.  Uncontrolled diabetes mellitus with hyperglycemia with peripheral neuropathy Hemoglobin A1c at 8. CBG (last 3)  Recent Labs    06/27/20 1139 06/27/20 2027 06/28/20 0840  GLUCAP 147* 315* 215*   Uncontrolled with hyperglycemia Added 10 units of Lantus and change sliding scale to moderate and continue with NovoLog 3 units 3 times daily AC.    Hypertensive urgency/uncontrolled hypertension Resolved, blood pressure parameters are well controlled    Hypokalemia Replaced.    Hyperlipidemia Continue with statin.    AKI probably secondary to ATN from infection. Creatinine improved with IV fluids. Creatinine stable at 1.2   Leukocytosis  Resolved WBC within normal limits    Mild normocytic anemia Continue to monitor. Hemoglobin around 11.7   Patient continues to refuse COVID-19 testing.   DVT prophylaxis: LOVENOX.  Code Status: Full code Family Communication: none at bedside.  Disposition:   Status is: Inpatient  Remains inpatient appropriate because:Ongoing diagnostic testing needed not appropriate for outpatient work up, IV treatments appropriate due to intensity of illness or inability to take PO and Inpatient level of care appropriate due to severity of illness   Dispo: The patient is from: Home              Anticipated d/c is to: Home              Anticipated d/c date is: 2 days              Patient currently is not medically stable to d/c.       Consultants:   Podiatry Dr Luana Shu.   Vascular surgery Dr. Trula Slade  Procedures: none.  Antimicrobials:  Anti-infectives (From admission, onward)   Start  Dose/Rate Route Frequency Ordered Stop   06/27/20 1800  vancomycin (VANCOREADY) IVPB 1250 mg/250 mL     Discontinue     1,250 mg 166.7 mL/hr over 90 Minutes Intravenous Every 12 hours 06/27/20 0952     06/27/20 1636  vancomycin (VANCOCIN) powder   Status:  Discontinued          As needed 06/27/20 1636 06/27/20 1751   06/26/20 2000  vancomycin (VANCOCIN) IVPB 1000 mg/200 mL premix  Status:  Discontinued        1,000 mg 200 mL/hr over 60 Minutes Intravenous Every 8 hours 06/26/20 1350 06/27/20 0952   06/26/20 1000  vancomycin (VANCOREADY) IVPB 1250 mg/250 mL  Status:  Discontinued        1,250 mg 166.7 mL/hr over 90 Minutes Intravenous Every 12 hours 06/25/20 2024 06/26/20 1350   06/26/20 0600  piperacillin-tazobactam (ZOSYN) IVPB 3.375 g     Discontinue     3.375 g 12.5 mL/hr over 240 Minutes Intravenous Every 8 hours 06/25/20 2024     06/25/20 2100  vancomycin (VANCOREADY) IVPB 2000 mg/400 mL        2,000 mg 200 mL/hr over 120 Minutes Intravenous  Once 06/25/20 2024 06/25/20 2259   06/25/20 1900  piperacillin-tazobactam (ZOSYN) IVPB 4.5 g  Status:  Discontinued        4.5 g 200 mL/hr over 30 Minutes Intravenous  Once 06/25/20 1832 06/25/20 1837   06/25/20 1900  piperacillin-tazobactam (ZOSYN) IVPB 2.25 g        2.25 g 100 mL/hr over 30 Minutes Intravenous Every 30 min 06/25/20 1837 06/25/20 2023   06/25/20 1815  piperacillin-tazobactam (ZOSYN) IVPB 4.5 g  Status:  Discontinued        4.5 g 200 mL/hr over 30 Minutes Intravenous  Once 06/25/20 1801 06/25/20 1837         Subjective: Alert and comfortable no distress noted today  Objective: Vitals:   06/27/20 1143 06/27/20 2027 06/28/20 0409 06/28/20 0846  BP: 119/74 90/70 124/71 120/82  Pulse: 68 73 72 71  Resp: 14 16 16 17   Temp: 98.6 F (37 C) 98.1 F (36.7 C) (!) 97.5 F (36.4 C) 98.2 F (36.8 C)  TempSrc:  Oral Oral   SpO2: 97% 97% 99% 100%  Weight:      Height:        Intake/Output Summary (Last 24 hours) at 06/28/2020 1208 Last data filed at 06/28/2020 0518 Gross per 24 hour  Intake 955.16 ml  Output 10 ml  Net 945.16 ml   Filed Weights   06/25/20 1629  Weight: 94.5 kg    Examination:   General exam: Alert and comfortable not in any kind of  distress. Respiratory system: Clear to auscultation bilaterally, no wheezing or rhonchi Cardiovascular system: S1-S2 heard, regular rate rhythm, no JVD, no.  Leg edema Gastrointestinal system: Abdomen is soft, nontender, nondistended, bowel sounds normal Central nervous system: Alert and oriented, grossly nonfocal Extremities:left foot bandaged.  Skin:  Left foot bandaged. Psychiatry: mood is appropriate.     Data Reviewed: I have personally reviewed following labs and imaging studies  CBC: Recent Labs  Lab 06/25/20 1736 06/26/20 0458 06/27/20 0635  WBC 13.4* 12.8* 8.7  NEUTROABS 11.0*  --   --   HGB 12.4* 11.6* 11.7*  HCT 35.4* 34.9* 34.4*  MCV 89.6 93.6 92.7  PLT 250 246 423    Basic Metabolic Panel: Recent Labs  Lab 06/25/20 1736 06/26/20 0458 06/27/20 0635 06/28/20 0450  NA 134*  136 137  --   K 3.5 3.3* 3.9  --   CL 98 100 104  --   CO2 20* 19* 23  --   GLUCOSE 187* 103* 207*  --   BUN 37* 29* 32*  --   CREATININE 1.37* 1.15 1.26* 1.29*  CALCIUM 9.2 9.0 9.1  --     GFR: Estimated Creatinine Clearance: 77.7 mL/min (A) (by C-G formula based on SCr of 1.29 mg/dL (H)).  Liver Function Tests: Recent Labs  Lab 06/25/20 1736  AST 14*  ALT 16  ALKPHOS 62  BILITOT 2.5*  PROT 8.0  ALBUMIN 3.6    CBG: Recent Labs  Lab 06/26/20 2038 06/27/20 0752 06/27/20 1139 06/27/20 2027 06/28/20 0840  GLUCAP 215* 208* 147* 315* 215*     Recent Results (from the past 240 hour(s))  Aerobic/Anaerobic Culture (surgical/deep wound)     Status: None (Preliminary result)   Collection Time: 06/25/20  7:08 PM   Specimen: Foot; Tissue  Result Value Ref Range Status   Specimen Description   Final    FOOT Performed at [facility], [address] [place], [place] [address]    Special Requests   Final    Normal Performed at [place] [place], [place] [address]    Gram Stain   Final    RARE WBC PRESENT, PREDOMINANTLY  PMN ABUNDANT GRAM NEGATIVE RODS ABUNDANT GRAM POSITIVE COCCI IN CHAINS IN CLUSTERS    Culture   Final    ABUNDANT STAPHYLOCOCCUS AUREUS SUSCEPTIBILITIES TO FOLLOW WITHIN MIXED ORGANISMS HOLDING FOR POSSIBLE ANAEROBE Performed at [facility], [place] [address], [place], [place] [address]    Report Status PENDING  Incomplete  Aerobic/Anaerobic Culture (surgical/deep wound)     Status: None (Preliminary result)   Collection Time: 06/27/20  4:42 PM   Specimen: Wound; Abscess  Result Value Ref Range Status   Specimen Description   Final    WOUND Performed at [facility], [address] [place], [place] [address]    Special Requests   Final    NONE Performed at [facility], [place], [place] [address]    Gram Stain NO WBC SEEN NO ORGANISMS SEEN   Final   Culture   Final    NO GROWTH < 12 HOURS Performed at [facility], [place] [address], [place], [place] [address]    Report Status PENDING  Incomplete         Radiology Studies: DG Foot Complete Left  Result Date: 06/27/2020 CLINICAL DATA:  Postop check. EXAM: LEFT FOOT - COMPLETE 3+ VIEW COMPARISON:  June 25, 2020 FINDINGS: There are postsurgical changes of the lateral aspect of the foot. There is subcutaneous gas with soft tissue edema and new antibiotic beads. There is no acute displaced fracture or dislocation. There is a small plantar calcaneal spur. There appears to be interval resection of a portion of the base of the fifth metatarsal. Correlation with the patient's operative report is recommended. IMPRESSION: Postsurgical changes as above. Electronically Signed   By: Constance Holster M.D.   On: 06/27/2020 19:34        Scheduled Meds: . amLODipine  10 mg Oral Daily  . aspirin EC  81 mg Oral Daily  . enoxaparin (LOVENOX) injection  40 mg Subcutaneous Q24H  . irbesartan  300 mg Oral Daily   And  . hydrochlorothiazide  25 mg Oral Daily  . insulin aspart  0-15  Units  Subcutaneous TID WC  . insulin aspart  0-5 Units Subcutaneous QHS  . insulin glargine  10 Units Subcutaneous Daily  . rosuvastatin  40 mg Oral Daily   Continuous Infusions: . piperacillin-tazobactam (ZOSYN)  IV 3.375 g (06/28/20 0616)  . vancomycin 1,250 mg (06/28/20 0518)     LOS: 3 days        Hosie Poisson, MD [facility]   To contact the attending provider between 7A-7P or the covering provider during after hours 7P-7A, please log into the web site [email or web address] and access using universal [place] password for that web site. If you do not have the password, please call the hospital operator.  06/28/2020, 12:08 PM

## 2020-06-28 NOTE — Consult Note (Addendum)
[facility] Nurse Consult Note: Reason for Consult: Pt is Pt is on isolation for Covid: followed by podiatry for post-op wound. Consult requested to apply Vac dressing. Wound type: Left foot with full thickness post-op wound; discussed plan of care with podiatry team via secure chat.  Removed Iodoform packing strip and Adaptic dressing remains stapled in place over antibiotic beads; unable to assess wound. There are sutures which extend beyond the Adaptic and they are intact and well-approximated.  Pt medicated for pain prior to the procedure and tolerated without c/o pain.    Dressing procedure/placement/frequency: Applied barrier ring around Adaptic to attempt to maintain a seal, then applied one piece black foam to 129mm cont suction.  Applied ace wrap and post-op boot immobilizer. [place] team will plan to change Vac dressing M/W/F while patient is in the hospital.  Julien Girt MSN, RN, [place], [place], [place]

## 2020-06-28 NOTE — Progress Notes (Signed)
PODIATRY / FOOT AND ANKLE SURGERY PROGRESS NOTE  Requesting Physician: Loletha Grayer MD  Reason for consult: Left foot ulcer/infection  Chief Complaint: Left foot ulcer, drainage, infection   HPI: [name] is a 54 y.o. male who presents status post left partial fifth metatarsal base resection, incision and drainage, and peroneal tendon anastomoses with antibiotic bead application.  Patient states he has minimal to no pain today.  Patient states he is feeling much better today than he did prior to his admission and in the emergency room.  Patient recently had wound VAC applied to the left foot as was instructed by wound care.  He has maintained nonweightbearing since the procedure.  PMHx:  Past Medical History:  Diagnosis Date  . Diabetes mellitus without complication ([place])   . Hyperlipidemia   . Hypertension     Surgical Hx:  Past Surgical History:  Procedure Laterality Date  . BONE BIOPSY Left 06/27/2020   Procedure: BONE BIOPSY;  Surgeon: Caroline More, DPM;  Location: [facility] ORS;  Service: Podiatry;  Laterality: Left;  . INCISION AND DRAINAGE Left 06/27/2020   Procedure: INCISION AND DRAINAGE;  Surgeon: Caroline More, DPM;  Location: [facility] ORS;  Service: Podiatry;  Laterality: Left;  . NO PAST SURGERIES      FHx:  Family History  Problem Relation Age of Onset  . Heart disease Father   . Heart attack Father   . Healthy Mother   . Prostate cancer Neg Hx   . Kidney cancer Neg Hx   . Bladder Cancer Neg Hx     Social History:  reports that he has never smoked. He has never used smokeless tobacco. He reports current alcohol use of about 6.0 - 12.0 standard drinks of alcohol per week. He reports that he does not use drugs.  Allergies: No Known Allergies  Review of Systems: General ROS: negative Psychological ROS: negative Respiratory ROS: no cough, shortness of breath, or wheezing Cardiovascular ROS: no chest pain or dyspnea on exertion Gastrointestinal ROS: no abdominal  pain, change in bowel habits, or black or bloody stools Musculoskeletal ROS: positive for - joint pain and joint swelling Neurological ROS: positive for - numbness/tingling Dermatological ROS: positive for Left fifth metatarsal base incision/wound  Medications Prior to Admission  Medication Sig Dispense Refill  . amLODipine (NORVASC) 10 MG tablet TAKE 1 TABLET(10 MG) BY MOUTH DAILY 90 tablet 1  . aspirin 81 MG tablet Take by mouth.    Marland Kitchen glipiZIDE (GLUCOTROL XL) 10 MG 24 hr tablet Take 1 tablet (10 mg total) by mouth daily with breakfast. 90 tablet 1  . JARDIANCE 25 MG TABS tablet TAKE 1 TABLET BY MOUTH ONCE DAILY 90 tablet 0  . rosuvastatin (CRESTOR) 40 MG tablet TAKE 1 TABLET BY MOUTH EVERY OTHER DAY. USUALLY TAKES MONDAY-FRIDAY 90 tablet 1  . torsemide (DEMADEX) 10 MG tablet Take by mouth.    . valsartan-hydrochlorothiazide (DIOVAN-HCT) 320-25 MG tablet TAKE 1 TABLET BY MOUTH EVERY DAY( IN PLACE OF LISINOPRIL/HCTZ) 90 tablet 0  . Vitamin D, Ergocalciferol, (DRISDOL) 1.25 MG (50000 UNIT) CAPS capsule TAKE ONE CAPSULE BY MOUTH ONCE A WEEK FOR 12 WEEKS 12 capsule 0    Physical Exam: General: Alert and oriented.  No apparent distress.  Vascular: DP pulse right and left palpable, PT pulses nonpalpable bilateral.  Capillary fill time appears to be intact to digits bilateral.  Mild swelling and erythema to the lateral aspect of the left foot, improved since last visit.  Some dusky changes present to  the dorsal lateral aspect of the foot left.  Neuro: Light touch sensation absent to bilateral lower extremities.  Derm: Wound VAC in place over left lateral foot.  Incision line appears to be intact with sutures intact.  Unable to examine open wound at fifth metatarsal base due to wound VAC present.  Decreased erythema and edema overall since admission to the left foot.  MSK: Minimal to no pain on palpation to the left foot at the fifth metatarsal base area.  Cavovarus foot type bilateral.  Results  for orders placed or performed during the hospital encounter of 06/25/20 (from the past 48 hour(s))  Glucose, capillary     Status: Abnormal   Collection Time: 06/26/20  4:39 PM  Result Value Ref Range   Glucose-Capillary 246 (H) 70 - 99 mg/dL    Comment: Glucose reference range applies only to samples taken after fasting for at least 8 hours.  Glucose, capillary     Status: Abnormal   Collection Time: 06/26/20  8:38 PM  Result Value Ref Range   Glucose-Capillary 215 (H) 70 - 99 mg/dL    Comment: Glucose reference range applies only to samples taken after fasting for at least 8 hours.  HIV Antibody (routine testing w rflx)     Status: None   Collection Time: 06/27/20  6:35 AM  Result Value Ref Range   HIV Screen 4th Generation wRfx Non Reactive Non Reactive    Comment: Performed at [facility], [address], [place], [place] [address]  C-reactive protein     Status: Abnormal   Collection Time: 06/27/20  6:35 AM  Result Value Ref Range   CRP 15.5 (H) <1.0 mg/dL    Comment: Performed at [place] [address] [place], [place] [address]  CBC     Status: Abnormal   Collection Time: 06/27/20  6:35 AM  Result Value Ref Range   WBC 8.7 4.0 - 10.5 K/uL   RBC 3.71 (L) 4.22 - 5.81 MIL/uL   Hemoglobin 11.7 (L) 13.0 - 17.0 g/dL   HCT 34.4 (L) 39 - 52 %   MCV 92.7 80.0 - 100.0 fL   MCH 31.5 26.0 - 34.0 pg   MCHC 34.0 30.0 - 36.0 g/dL   RDW 11.7 11.5 - 15.5 %   Platelets 281 150 - 400 K/uL   nRBC 0.0 0.0 - 0.2 %    Comment: Performed at [facility], [address] [place], [place] [address]  Basic metabolic panel     Status: Abnormal   Collection Time: 06/27/20  6:35 AM  Result Value Ref Range   Sodium 137 135 - 145 mmol/L   Potassium 3.9 3.5 - 5.1 mmol/L   Chloride 104 98 - 111 mmol/L   CO2 23 22 - 32 mmol/L   Glucose, Bld 207 (H) 70 - 99 mg/dL    Comment: Glucose reference range applies only to samples taken after fasting for at least 8 hours.   BUN  32 (H) 6 - 20 mg/dL   Creatinine, Ser 1.26 (H) 0.61 - 1.24 mg/dL   Calcium 9.1 8.9 - 10.3 mg/dL   GFR calc non Af Amer >60 >60 mL/min   GFR calc Af Amer >60 >60 mL/min   Anion gap 10 5 - 15    Comment: Performed at [facility], [address] [place], [place] [address]  Glucose, capillary     Status: Abnormal   Collection Time: 06/27/20  7:52 AM  Result Value Ref Range   Glucose-Capillary 208 (H) 70 - 99 mg/dL    Comment: Glucose reference range applies only to samples taken after fasting for at least 8 hours.  Glucose, capillary     Status: Abnormal   Collection Time: 06/27/20 11:39 AM  Result Value Ref Range   Glucose-Capillary 147 (H) 70 - 99 mg/dL    Comment: Glucose reference range applies only to samples taken after fasting for at least 8 hours.  Aerobic/Anaerobic Culture (surgical/deep wound)     Status: None (Preliminary result)   Collection Time: 06/27/20  4:42 PM   Specimen: Wound; Abscess  Result Value Ref Range   Specimen Description      WOUND Performed at [facility], [address] [place], [place] [address]    Special Requests      NONE Performed at [facility], [place], [place] [address]    Gram Stain NO WBC SEEN NO ORGANISMS SEEN     Culture      NO GROWTH < 12 HOURS Performed at [facility], [address], [place], [place] [address]    Report Status PENDING   Glucose, capillary     Status: Abnormal   Collection Time: 06/27/20  8:27 PM  Result Value Ref Range   Glucose-Capillary 315 (H) 70 - 99 mg/dL    Comment: Glucose reference range applies only to samples taken after fasting for at least 8 hours.  Creatinine, serum     Status: Abnormal   Collection Time: 06/28/20  4:50 AM  Result Value Ref Range   Creatinine, Ser 1.29 (H) 0.61 - 1.24 mg/dL   GFR calc non Af Amer >60 >60 mL/min   GFR calc Af Amer >60 >60 mL/min    Comment: Performed at [facility], [address]  [place], [place] [address]  Glucose, capillary     Status: Abnormal   Collection Time: 06/28/20  8:40 AM  Result Value Ref Range   Glucose-Capillary 215 (H) 70 - 99 mg/dL    Comment: Glucose reference range applies only to samples taken after fasting for at least 8 hours.  Glucose, capillary     Status: Abnormal   Collection Time: 06/28/20 12:17 PM  Result Value Ref Range   Glucose-Capillary 253 (H) 70 - 99 mg/dL    Comment: Glucose reference range applies only to samples taken after fasting for at least 8 hours.   DG Foot Complete Left  Result Date: 06/27/2020 CLINICAL DATA:  Postop check. EXAM: LEFT FOOT - COMPLETE 3+ VIEW COMPARISON:  June 25, 2020 FINDINGS: There are postsurgical changes of the lateral aspect of the foot. There is subcutaneous gas with soft tissue edema and new antibiotic beads. There is no acute displaced fracture or dislocation. There is a small plantar calcaneal spur. There appears to be interval resection of a portion of the base of the fifth metatarsal. Correlation with the patient's operative report is recommended. IMPRESSION: Postsurgical changes as above. Electronically Signed   By: Constance Holster M.D.   On: 06/27/2020 19:34    Blood pressure 120/82, pulse 71, temperature 98.2 F (36.8 C), resp. rate 17, height 5\' 11"  (1.803 m), weight 94.5 kg, SpO2 100 %.  Assessment 1. Left fifth metatarsal base osteomyelitis with associated ulceration with abscess formation with associated cellulitis status post I&D with partial fifth metatarsal base resection, antibiotic bead application, and peroneal tendon anastomosis 2. Diabetes type 2 polyneuropathy 3. PVD  Plan -Patient seen  and examined. -Postop x-rays reviewed and discussed with patient in detail.  Appears to show partial resection of fifth metatarsal base with antibiotic bead application.  Still able to visualize calcification of arterials.  No acute signs of infection present from this imaging. -Patient had hard  bone at the fifth metatarsal base.  A portion was resected and sent off to pathology with supposed clean margin marked in black ink.  A bone culture was also sent off of the fifth metatarsal base.  Previous wound culture growing staph aureus, awaiting sensitivities -Pathology pending. -Wound VAC in place to the left foot over the residual fifth metatarsal base wound.  Discussed with patient that this wound may take a long time to heal. -Patient's blood flow appears to be somewhat compromised appreciate recommendations from vascular surgery for potential intervention to improve flow. -Appreciate medicine recommendations for antibiotic therapy.  If pathology comes back with dirty margin that shows inflammatory cells and sign of infection then patient will likely need prolonged IV antibiotics.  ID consultation will need to be placed if this occurs. -Patient is to maintain nonweightbearing to left lower extremity at all times.  PT consultation has been ordered. -Patient is to use the boot when up and transferring but can remove the boot when at rest.  Instructed and stressed importance of staying nonweightbearing to the patient on wound healing. -Patient also needs to improve blood glucose control.  If sugars remain elevated he will have a hard time healing this wound. -Patient is very concerned about going to work as he walks a lot.  Instructed patient that this wound could take a number of months to heal from.   Podiatry team to follow more peripherally at this point.  No further surgical intervention is indicated at this time.  Caroline More, DPM 06/28/2020, 12:54 PM

## 2020-06-29 DIAGNOSIS — Z794 Long term (current) use of insulin: Secondary | ICD-10-CM

## 2020-06-29 DIAGNOSIS — L97426 Non-pressure chronic ulcer of left heel and midfoot with bone involvement without evidence of necrosis: Secondary | ICD-10-CM

## 2020-06-29 LAB — BASIC METABOLIC PANEL
Anion gap: 7 (ref 5–15)
BUN: 29 mg/dL — ABNORMAL HIGH (ref 6–20)
CO2: 28 mmol/L (ref 22–32)
Calcium: 9.1 mg/dL (ref 8.9–10.3)
Chloride: 103 mmol/L (ref 98–111)
Creatinine, Ser: 1.11 mg/dL (ref 0.61–1.24)
GFR calc Af Amer: >60 mL/min (ref >60)
GFR calc non Af Amer: >60 mL/min (ref >60)
Glucose, Bld: 128 mg/dL — ABNORMAL HIGH (ref 70–99)
Potassium: 3.9 mmol/L (ref 3.5–5.1)
Sodium: 138 mmol/L (ref 135–145)

## 2020-06-29 LAB — CBC WITH DIFFERENTIAL/PLATELET
Abs Immature Granulocytes: 0.03 10*3/uL (ref 0.00–0.07)
Basophils Absolute: 0 10*3/uL (ref 0.0–0.1)
Basophils Relative: 1 %
Eosinophils Absolute: 0.1 10*3/uL (ref 0.0–0.5)
Eosinophils Relative: 1 %
HCT: 35.9 % — ABNORMAL LOW (ref 39.0–52.0)
Hemoglobin: 11.4 g/dL — ABNORMAL LOW (ref 13.0–17.0)
Immature Granulocytes: 0 %
Lymphocytes Relative: 24 %
Lymphs Abs: 1.6 10*3/uL (ref 0.7–4.0)
MCH: 30.6 pg (ref 26.0–34.0)
MCHC: 31.8 g/dL (ref 30.0–36.0)
MCV: 96.2 fL (ref 80.0–100.0)
Monocytes Absolute: 0.7 10*3/uL (ref 0.1–1.0)
Monocytes Relative: 10 %
Neutro Abs: 4.4 10*3/uL (ref 1.7–7.7)
Neutrophils Relative %: 64 %
Platelets: 360 10*3/uL (ref 150–400)
RBC: 3.73 MIL/uL — ABNORMAL LOW (ref 4.22–5.81)
RDW: 11.4 % — ABNORMAL LOW (ref 11.5–15.5)
Smear Review: NORMAL
WBC: 6.8 10*3/uL (ref 4.0–10.5)
nRBC: 0 % (ref 0.0–0.2)

## 2020-06-29 LAB — GLUCOSE, CAPILLARY
Glucose-Capillary: 122 mg/dL — ABNORMAL HIGH (ref 70–99)
Glucose-Capillary: 192 mg/dL — ABNORMAL HIGH (ref 70–99)
Glucose-Capillary: 182 mg/dL — ABNORMAL HIGH (ref 70–99)
Glucose-Capillary: 146 mg/dL — ABNORMAL HIGH (ref 70–99)

## 2020-06-29 LAB — SURGICAL PATHOLOGY

## 2020-06-29 LAB — CREATININE, SERUM
Creatinine, Ser: 1.19 mg/dL (ref 0.61–1.24)
GFR calc Af Amer: >60 mL/min (ref >60)
GFR calc non Af Amer: >60 mL/min (ref >60)

## 2020-06-29 MED ORDER — SODIUM CHLORIDE 0.9 % IV SOLN
3.0000 g | Freq: Four times a day (QID) | INTRAVENOUS | Status: DC
  Administered 2020-06-29 – 2020-07-01 (×7): 3 g via INTRAVENOUS
  Filled 2020-06-29: qty 3
  Filled 2020-06-29 (×2): qty 8
  Filled 2020-06-29: qty 3
  Filled 2020-06-29: qty 8
  Filled 2020-06-29: qty 3
  Filled 2020-06-29 (×4): qty 8

## 2020-06-29 MED ORDER — CEFAZOLIN SODIUM-DEXTROSE 2-4 GM/100ML-% IV SOLN
2.0000 g | INTRAVENOUS | Status: AC
  Filled 2020-06-29: qty 100

## 2020-06-29 MED ORDER — SODIUM CHLORIDE 0.9 % IV SOLN: INTRAVENOUS | Status: DC

## 2020-06-29 NOTE — Consult Note (Signed)
Pharmacy Antibiotic Note  [name] is a 54 y.o. male admitted on 06/25/2020 with Left foot ulcer with osteomyelitis.  Pharmacy has been consulted for Unasyn dosing.  Plan: Start Unasyn 3g IV every 6 hours   Height: 5\' 11"  (180.3 cm) Weight: 94.5 kg (208 lb 5.4 oz) IBW/kg (Calculated) : 75.3  Temp (24hrs), Avg:98.1 F (36.7 C), Min:97.6 F (36.4 C), Max:98.3 F (36.8 C)  Recent Labs  Lab 06/25/20 1736 06/26/20 0458 06/27/20 0635 06/28/20 0450 06/29/20 0415  WBC 13.4* 12.8* 8.7  --  6.8  CREATININE 1.37* 1.15 1.26* 1.29* 1.11  1.19    Estimated Creatinine Clearance: 84.3 mL/min (by C-G formula based on SCr of 1.19 mg/dL).    No Known Allergies  Antimicrobials this admission: 7/16 Zosyn  >> 7/20 7/16 vancomycin >> 7/20 7/20 Unasyn>>  Microbiology results: Abscess from wound: RARE STREPTOCOCCUS MITIS/ORALIS  Foot: ABUNDANT STAPHYLOCOCCUS AUREUS   Thank you for allowing pharmacy to be a part of this patient's care.  Pernell Dupre, PharmD, BCPS Clinical Pharmacist 06/29/2020 6:11 PM

## 2020-06-29 NOTE — TOC Initial Note (Signed)
Transition of Care [facility]) - Initial/Assessment Note    Patient Details  Name: [name] MRN: [id number] Date of Birth: 07-22-1966  Transition of [facility]) CM/SW Contact:    Shelbie Hutching, RN Phone Number: 06/29/2020, 10:47 AM  Clinical Narrative:                 Patient admitted for Diabetic foot ulcer left foot.  Patient underwent left partial 5th metatarsal base amputation and I&D yesterday in the OR.  Patient has a wound vac to the left foot and is non weight bearing to the left foot.  PT has recommended home health, patient at this time thinks that he will be okay without home health and would like to go to the wound clinic for dressing changes.  Patient does need a walker, [employer] will provide the walker before patient goes home.  Patient will also need wound vac at home.  RNCM will cont to follow for equipment needs.   Expected Discharge Plan: Home/Self Care Barriers to Discharge: Continued Medical Work up   Patient Goals and CMS Choice        Expected Discharge Plan and Services Expected Discharge Plan: Home/Self Care   Discharge Planning Services: CM Consult   Living arrangements for the past 2 months: Single Family Home                 DME Arranged: Walker rolling DME Agency: [employer]                  Prior Living Arrangements/Services Living arrangements for the past 2 months: Single Family Home Lives with:: Spouse, Minor Children Patient language and need for interpreter reviewed:: Yes Do you feel safe going back to the place where you live?: Yes      Need for Family Participation in Patient Care: Yes (Comment) (diabetic foot ulcer) Care giver support system in place?: Yes (comment) (wife and son)   Criminal Activity/Legal Involvement Pertinent to Current Situation/Hospitalization: No - Comment as needed  Activities of Daily Living Home Assistive Devices/Equipment: None ADL Screening (condition at time of admission) Patient's cognitive ability  adequate to safely complete daily activities?: Yes Is the patient deaf or have difficulty hearing?: No Does the patient have difficulty seeing, even when wearing glasses/contacts?: No Does the patient have difficulty concentrating, remembering, or making decisions?: No Patient able to express need for assistance with ADLs?: Yes Does the patient have difficulty dressing or bathing?: No Independently performs ADLs?: Yes (appropriate for developmental age) Does the patient have difficulty walking or climbing stairs?: No Weakness of Legs: None Weakness of Arms/Hands: None  Permission Sought/Granted Permission sought to share information with : Case Manager, Family Supports Permission granted to share information with : Yes, Verbal Permission Granted  Share Information with NAME: [name]     Permission granted to share info w Relationship: Wife     Emotional Assessment   Attitude/Demeanor/Rapport: Engaged Affect (typically observed): Accepting Orientation: : Oriented to Self, Oriented to Place, Oriented to  Time, Oriented to Situation Alcohol / Substance Use: Not Applicable Psych Involvement: No (comment)  Admission diagnosis:  Diabetic foot ulcer ([place]) [D17.616, L97.509] Diabetic foot infection ([place]) [W73.710, L08.9] Poorly controlled type 2 diabetes mellitus ([place]) [E11.65] Patient Active Problem List   Diagnosis Date Noted  . Diabetic foot ulcer ([place]) 06/25/2020  . Diabetic foot infection ([place])   . Secondary renal hyperparathyroidism ([place]) 03/19/2019  . Type 2 diabetes mellitus with left diabetic foot ulcer ([place]) 12/18/2018  . Type  2 diabetes mellitus with chronic kidney disease and hypertension ([place]) 12/18/2018  . Type 2 diabetes mellitus with hyperlipidemia ([place]) 12/18/2018  . Dyslipidemia associated with type 2 diabetes mellitus ([place]) 07/03/2018  . Vitamin D deficiency 04/11/2018  . ED (erectile dysfunction) 04/10/2018  . Lower urinary tract symptoms (LUTS) 02/09/2017  .  Hypertension 07/05/2015  . Diabetes mellitus type 2, uncontrolled ([place]) 07/05/2015  . HLD (hyperlipidemia) 07/05/2015   PCP:  [name], MD Pharmacy:   [facility] [phone] [address], [place] [address] Phone: [phone] Fax: [phone]     Social Determinants of Health (SDOH) Interventions    Readmission Risk Interventions No flowsheet data found.

## 2020-06-29 NOTE — Progress Notes (Signed)
Daily Progress Note   Subjective  - 2 Days Post-Op  Fu 5th met base debridement left foot. Has wound vac placed yesterday. Pt refusing covid testing and vaccine  Objective Vitals:   06/28/20 2036 06/29/20 0410 06/29/20 0820 06/29/20 1204  BP: 118/76 128/66 (!) 145/87 132/72  Pulse: 66 63 68 73  Resp: _0 Temp: 98.1 F (36.7 C) 98.1 F (36.7 C) 97.6 F (36.4 C) 98.3 F (36.8 C)  TempSrc: Oral Oral    SpO2: 100% 99% 98% 95%  Weight:      Height:        Physical Exam: Boot in place and wound vac in place.  Bone culture intra-op shows no growth so far.  Wound culture from admission shows pan sensitive staph. Pathology pending  Laboratory CBC    Component Value Date/Time   WBC 6.8 06/29/2020 0415   HGB 11.4 (L) 06/29/2020 0415   HCT 35.9 (L) 06/29/2020 0415   PLT 360 06/29/2020 0415    BMET    Component Value Date/Time   NA 138 06/29/2020 0415   NA 135 06/19/2016 1655   K 3.9 06/29/2020 0415   CL 103 06/29/2020 0415   CO2 28 06/29/2020 0415   GLUCOSE 128 (H) 06/29/2020 0415   BUN 29 (H) 06/29/2020 0415   BUN 28 (H) 06/19/2016 1655   CREATININE 1.19 06/29/2020 0415   CREATININE 1.11 06/29/2020 0415   CREATININE 1.17 11/28/2019 0000   CALCIUM 9.1 06/29/2020 0415   GFRNONAA >60 06/29/2020 0415   GFRNONAA >60 06/29/2020 0415   GFRNONAA 71 11/28/2019 0000   GFRAA >60 06/29/2020 0415   GFRAA >60 06/29/2020 0415   GFRAA 83 11/28/2019 0000    Assessment/Planning: S/P I & D left foot  PVD   Pt on schedule for angio tomorrow.  C/W wound vac  Bone culture so far negative.  Samara Deist A  06/29/2020, 12:51 PM

## 2020-06-29 NOTE — Consult Note (Signed)
NAME: [name]  DOB: [birth year]  MRN: [id number]  Date/Time: 06/29/2020 5:38 PM  REQUESTING PROVIDER: Dr. Luana Shu Subjective:  REASON FOR CONSULT: Left foot infection ? [name] is a 54 y.o. with a history of diabetes mellitus, hyperlipidemia and hypertension presented to the ED on 06/25/2020 with pain left foot and ulcer to the left foot.  As per patient ulcer has been present in the left foot for many weeks and has been soaking his foot in Epsom salt and peeling away dead skin.  But recently the foot has become very painful and swollen and hence he came to the ED.  Patient did not have any fever or chills. In the ED vitals were temperature of 98 blood pressure of 150/83, heart rate of 86, pulse ox of 98% and respiratory rate of 18. Labs revealed a glucose of 187, creatinine of 1.37, WBC of 13.4, hemoglobin of 12.4, platelet of 250.  Hemoglobin A1c was 8. MRI of the left foot showed a large area of ulceration over the lateral base of the fifth metatarsal with a sinus tract, subcutaneous emphysema and a loculated abscess.  The findings were suggestive of early osteomyelitis involving the fifth metatarsal base. He was seen by podiatrist Dr. Luana Shu and he took him for surgery on 06/27/2020.  He underwent left partial fifth metatarsal base amputation/resection.  An incision and drainage of the left foot abscess.  He also underwent peroneus brevis to peroneus longus anastomosis.  Patient is currently on vancomycin and piperacillin tazobactam. The culture is staph aureus on superficial culture and rash Streptococcus mitis/paralysis on the surgical culture.. Patient is going for angiogram tomorrow. I am asked to see the patient for antibiotic recommendation.  Past Medical History:  Diagnosis Date  . Diabetes mellitus without complication ([place])   . Hyperlipidemia   . Hypertension     Past Surgical History:  Procedure Laterality Date  . BONE BIOPSY Left 06/27/2020   Procedure: BONE BIOPSY;   Surgeon: Caroline More, DPM;  Location: [facility] ORS;  Service: Podiatry;  Laterality: Left;  . INCISION AND DRAINAGE Left 06/27/2020   Procedure: INCISION AND DRAINAGE;  Surgeon: Caroline More, DPM;  Location: [facility] ORS;  Service: Podiatry;  Laterality: Left;  . NO PAST SURGERIES      Social History   Socioeconomic History  . Marital status: Married    Spouse name: Not on file  . Number of children: 1  . Years of education: Not on file  . Highest education level: Not on file  Occupational History  . Not on file  Tobacco Use  . Smoking status: Never Smoker  . Smokeless tobacco: Never Used  Vaping Use  . Vaping Use: Never used  Substance and Sexual Activity  . Alcohol use: Yes    Alcohol/week: 6.0 - 12.0 standard drinks    Types: 6 - 12 Cans of beer per week    Comment: Occasional  . Drug use: No  . Sexual activity: Yes  Other Topics Concern  . Not on file  Social History Narrative  . Not on file   Social Determinants of Health   Financial Resource Strain: Low Risk   . Difficulty of Paying Living Expenses: Not hard at all  Food Insecurity: No Food Insecurity  . Worried About Charity fundraiser in the Last Year: Never true  . Ran Out of Food in the Last Year: Never true  Transportation Needs: No Transportation Needs  . Lack of Transportation (Medical): No  . Lack  of Transportation (Non-Medical): No  Physical Activity: Insufficiently Active  . Days of Exercise per Week: 4 days  . Minutes of Exercise per Session: 30 min  Stress: No Stress Concern Present  . Feeling of Stress : Not at all  Social Connections:   . Frequency of Communication with Friends and Family:   . Frequency of Social Gatherings with Friends and Family:   . Attends Religious Services:   . Active Member of Clubs or Organizations:   . Attends Archivist Meetings:   Marland Kitchen Marital Status:   Intimate Partner Violence: Not At Risk  . Fear of Current or Ex-Partner: No  . Emotionally Abused: No  .  Physically Abused: No  . Sexually Abused: No    Family History  Problem Relation Age of Onset  . Heart disease Father   . Heart attack Father   . Healthy Mother   . Prostate cancer Neg Hx   . Kidney cancer Neg Hx   . Bladder Cancer Neg Hx    No Known Allergies ? Current Facility-Administered Medications  Medication Dose Route Frequency Provider Last Rate Last Admin  . 0.9 %  sodium chloride infusion   Intravenous Continuous Dew, Erskine Squibb, MD      . acetaminophen (TYLENOL) tablet 650 mg  650 mg Oral Q6H PRN Caroline More, DPM       Or  . acetaminophen (TYLENOL) suppository 650 mg  650 mg Rectal Q6H PRN Caroline More, DPM      . amLODipine (NORVASC) tablet 10 mg  10 mg Oral Daily Caroline More, DPM   10 mg at 06/29/20 4008  . aspirin EC tablet 81 mg  81 mg Oral Daily Caroline More, DPM   81 mg at 06/29/20 6761  . ceFAZolin (ANCEF) IVPB 2g/100 mL premix  2 g Intravenous 30 min Pre-Op Algernon Huxley, MD      . enoxaparin (LOVENOX) injection 40 mg  40 mg Subcutaneous Q24H Caroline More, DPM   40 mg at 06/28/20 2213  . hydrALAZINE (APRESOLINE) tablet 25 mg  25 mg Oral Q8H PRN Caroline More, DPM      . irbesartan (AVAPRO) tablet 300 mg  300 mg Oral Daily Caroline More, DPM   300 mg at 06/29/20 9509   And  . hydrochlorothiazide (HYDRODIURIL) tablet 25 mg  25 mg Oral Daily Caroline More, DPM   25 mg at 06/29/20 3267  . HYDROcodone-acetaminophen (NORCO/VICODIN) 5-325 MG per tablet 1 tablet  1 tablet Oral Q6H PRN Caroline More, DPM   1 tablet at 06/28/20 0956  . insulin aspart (novoLOG) injection 0-15 Units  0-15 Units Subcutaneous TID WC Hosie Poisson, MD   3 Units at 06/29/20 1208  . insulin aspart (novoLOG) injection 0-5 Units  0-5 Units Subcutaneous QHS Caroline More, DPM   4 Units at 06/27/20 2152  . insulin aspart (novoLOG) injection 3 Units  3 Units Subcutaneous TID WC Hosie Poisson, MD   3 Units at 06/29/20 1207  . insulin glargine (LANTUS) injection 10 Units  10 Units Subcutaneous Daily  Hosie Poisson, MD   10 Units at 06/29/20 [phone]  . ondansetron (ZOFRAN) tablet 4 mg  4 mg Oral Q6H PRN Caroline More, DPM       Or  . ondansetron (ZOFRAN) injection 4 mg  4 mg Intravenous Q6H PRN Caroline More, DPM      . piperacillin-tazobactam (ZOSYN) IVPB 3.375 g  3.375 g Intravenous Q8H Caroline More, DPM 12.5 mL/hr at 06/29/20 1515 3.375 g at  06/29/20 1515  . rosuvastatin (CRESTOR) tablet 40 mg  40 mg Oral Daily Caroline More, DPM   40 mg at 06/29/20 1610  . vancomycin (VANCOREADY) IVPB 1250 mg/250 mL  1,250 mg Intravenous Q12H Caroline More, DPM 166.7 mL/hr at 06/29/20 0544 1,250 mg at 06/29/20 0544     Abtx:  Anti-infectives (From admission, onward)   Start     Dose/Rate Route Frequency Ordered Stop   06/29/20 1707  ceFAZolin (ANCEF) IVPB 2g/100 mL premix     Discontinue     2 g 200 mL/hr over 30 Minutes Intravenous 30 min pre-op 06/29/20 1707     06/27/20 1800  vancomycin (VANCOREADY) IVPB 1250 mg/250 mL     Discontinue     1,250 mg 166.7 mL/hr over 90 Minutes Intravenous Every 12 hours 06/27/20 0952     06/27/20 1636  vancomycin (VANCOCIN) powder  Status:  Discontinued          As needed 06/27/20 1636 06/27/20 1751   06/26/20 2000  vancomycin (VANCOCIN) IVPB 1000 mg/200 mL premix  Status:  Discontinued        1,000 mg 200 mL/hr over 60 Minutes Intravenous Every 8 hours 06/26/20 1350 06/27/20 0952   06/26/20 1000  vancomycin (VANCOREADY) IVPB 1250 mg/250 mL  Status:  Discontinued        1,250 mg 166.7 mL/hr over 90 Minutes Intravenous Every 12 hours 06/25/20 2024 06/26/20 1350   06/26/20 0600  piperacillin-tazobactam (ZOSYN) IVPB 3.375 g     Discontinue     3.375 g 12.5 mL/hr over 240 Minutes Intravenous Every 8 hours 06/25/20 2024     06/25/20 2100  vancomycin (VANCOREADY) IVPB 2000 mg/400 mL        2,000 mg 200 mL/hr over 120 Minutes Intravenous  Once 06/25/20 2024 06/25/20 2259   06/25/20 1900  piperacillin-tazobactam (ZOSYN) IVPB 4.5 g  Status:  Discontinued        4.5  g 200 mL/hr over 30 Minutes Intravenous  Once 06/25/20 1832 06/25/20 1837   06/25/20 1900  piperacillin-tazobactam (ZOSYN) IVPB 2.25 g        2.25 g 100 mL/hr over 30 Minutes Intravenous Every 30 min 06/25/20 1837 06/25/20 2023   06/25/20 1815  piperacillin-tazobactam (ZOSYN) IVPB 4.5 g  Status:  Discontinued        4.5 g 200 mL/hr over 30 Minutes Intravenous  Once 06/25/20 1801 06/25/20 1837      REVIEW OF SYSTEMS:  Const: negative fever, negative chills, negative weight loss Eyes: negative diplopia or visual changes, negative eye pain ENT: negative coryza, negative sore throat Resp: negative cough, hemoptysis, dyspnea Cards: negative for chest pain, palpitations, lower extremity edema GU: negative for frequency, dysuria and hematuria GI: Negative for abdominal pain, diarrhea, bleeding, constipation Skin: negative for rash and pruritus Heme: negative for easy bruising and gum/nose bleeding MS: As above Neurolo:negative for headaches, dizziness, vertigo, memory problems  Psych: negative for feelings of anxiety, depression  Endocrine: negative for thyroid, diabetes Allergy/Immunology- negative for any medication or food allergies ?  Objective:  VITALS:  BP 140/76 (BP Location: Left Arm)   Pulse 68   Temp 98.3 F (36.8 C)   Resp 16   Ht 5\' 11"  (1.803 m)   Wt 94.5 kg   SpO2 97%   BMI 29.06 kg/m  PHYSICAL EXAM:  General: Alert, cooperative, no distress, appears stated age.  Head: Normocephalic, without obvious abnormality, atraumatic. Eyes: Conjunctivae clear, anicteric sclerae. Pupils are equal ENT Nares normal. No drainage  or sinus tenderness. Lips, mucosa, and tongue normal. No Thrush Neck: Supple, symmetrical, no adenopathy, thyroid: non tender no carotid bruit and no JVD. Back: No CVA tenderness. Lungs: Clear to auscultation bilaterally. No Wheezing or Rhonchi. No rales. Heart: Regular rate and rhythm, no murmur, rub or gallop. Abdomen: Soft, non-tender,not  distended. Bowel sounds normal. No masses Extremities:  Prior to surgery    Left leg dressing not removed atraumatic, no cyanosis. No edema. No clubbing Skin: No rashes or lesions. Or bruising Lymph: Cervical, supraclavicular normal. Neurologic: Grossly non-focal Pertinent Labs Lab Results CBC    Component Value Date/Time   WBC 6.8 06/29/2020 0415   RBC 3.73 (L) 06/29/2020 0415   HGB 11.4 (L) 06/29/2020 0415   HCT 35.9 (L) 06/29/2020 0415   PLT 360 06/29/2020 0415   MCV 96.2 06/29/2020 0415   MCH 30.6 06/29/2020 0415   MCHC 31.8 06/29/2020 0415   RDW 11.4 (L) 06/29/2020 0415   LYMPHSABS 1.6 06/29/2020 0415   MONOABS 0.7 06/29/2020 0415   EOSABS 0.1 06/29/2020 0415   BASOSABS 0.0 06/29/2020 0415    CMP Latest Ref Rng & Units 06/29/2020 06/29/2020 06/28/2020  Glucose 70 - 99 mg/dL 128(H) - -  BUN 6 - 20 mg/dL 29(H) - -  Creatinine 0.61 - 1.24 mg/dL 1.11 1.19 1.29(H)  Sodium 135 - 145 mmol/L 138 - -  Potassium 3.5 - 5.1 mmol/L 3.9 - -  Chloride 98 - 111 mmol/L 103 - -  CO2 22 - 32 mmol/L 28 - -  Calcium 8.9 - 10.3 mg/dL 9.1 - -  Total Protein 6.5 - 8.1 g/dL - - -  Total Bilirubin 0.3 - 1.2 mg/dL - - -  Alkaline Phos 38 - 126 U/L - - -  AST 15 - 41 U/L - - -  ALT 0 - 44 U/L - - -      Microbiology: Recent Results (from the past 240 hour(s))  Aerobic/Anaerobic Culture (surgical/deep wound)     Status: None (Preliminary result)   Collection Time: 06/25/20  7:08 PM   Specimen: Foot; Tissue  Result Value Ref Range Status   Specimen Description   Final    FOOT Performed at [facility], [address] [place], [place] [address]    Special Requests   Final    Normal Performed at [facility], [address] [place], [place] [address]    Gram Stain   Final    RARE WBC PRESENT, PREDOMINANTLY PMN ABUNDANT GRAM NEGATIVE RODS ABUNDANT GRAM POSITIVE COCCI IN CHAINS IN CLUSTERS    Culture   Final    ABUNDANT STAPHYLOCOCCUS AUREUS WITHIN MIXED  ORGANISMS HOLDING FOR POSSIBLE ANAEROBE Performed at [facility], [place] [address], [place], [place] [address]    Report Status PENDING  Incomplete   Organism ID, Bacteria STAPHYLOCOCCUS AUREUS  Final      Susceptibility   Staphylococcus aureus - MIC*    CIPROFLOXACIN <=0.5 SENSITIVE Sensitive     ERYTHROMYCIN <=0.25 SENSITIVE Sensitive     GENTAMICIN <=0.5 SENSITIVE Sensitive     OXACILLIN <=0.25 SENSITIVE Sensitive     TETRACYCLINE <=1 SENSITIVE Sensitive     VANCOMYCIN <=0.5 SENSITIVE Sensitive     TRIMETH/SULFA <=10 SENSITIVE Sensitive     CLINDAMYCIN <=0.25 SENSITIVE Sensitive     RIFAMPIN <=0.5 SENSITIVE Sensitive     Inducible Clindamycin NEGATIVE Sensitive     * ABUNDANT STAPHYLOCOCCUS AUREUS  Aerobic/Anaerobic Culture (surgical/deep wound)     Status: None (Preliminary  result)   Collection Time: 06/27/20  4:42 PM   Specimen: Wound; Abscess  Result Value Ref Range Status   Specimen Description   Final    WOUND Performed at [facility], [address] [place], [place] [address]    Special Requests   Final    NONE Performed at [facility], [address] [place], [place] [address]    Gram Stain   Final    NO WBC SEEN NO ORGANISMS SEEN Performed at [facility], [place] [address], [place], [place] [address]    Culture   Final    RARE STREPTOCOCCUS MITIS/ORALIS SUSCEPTIBILITIES TO FOLLOW NO ANAEROBES ISOLATED; CULTURE IN PROGRESS FOR 5 DAYS    Report Status PENDING  Incomplete    IMAGING RESULTS: I have personally reviewed the films ? Impression/Recommendation ? ?Left foot ulcer with osteomyelitis of the fifth metatarsal base.  Status post partial resection of the metatarsal base.  Staph aureus in culture as well as Streptococcus mitis in  culture. Bone pathology shows active osteomyelitis and margin has osteomyelitis. Patient will have to continue IV antibiotics for a few weeks.  But he does not want to do IV at home.  Gave him  the option of doing once a week long-acting glycol peptide in  day surgery .  We will need to discuss with his insurance to see whether he will be approved for dalbavancin.  Currently on vancomycin and Zosyn and will change to Unasyn  Diabetes mellitus on insulin  ?Hypertension onAmlodipine and irbesartan and hydrochlorothiazide  Hyperlipidemia on rosuvastatin  Discussed the management with the patient in detail.  ____________________________________________ Note:  This document was prepared using Dragon voice recognition software and may include unintentional dictation errors.

## 2020-06-29 NOTE — Progress Notes (Signed)
PROGRESS NOTE    [name]  IOX:[id number] DOB: 10-22-1966 DOA: 06/25/2020 PCP: [name], MD    Chief Complaint  Patient presents with  . Foot Pain  . Leg Pain    Brief Narrative:  54 year old with known history of diabetes, hypertension, hyperlipidemia, peripheral neuropathy presents with a nonhealing left foot ulcer on 06/25/2020.  On arrival he was afebrile, slightly hypertensive with right foot pain.  MRI of the left foot shows large area of ulceration over the lateral base of the fifth metatarsal with a sinus tract, subcutaneous emphysema and a loculated abscess along with findings suggestive of early osteomyelitis involving the fifth metatarsal bone. Podiatry consulted and he is scheduled for incision and drainage with washout with fifth metatarsal base debridement tomorrow.  He will ultimately need vascular surgery consultation for potential optimization of flow.  He was started on broad-spectrum IV antibiotics, wound culture sent for analysis and admitted to [facility] service. Patient underwent left partial fifth metatarsal base amputation/resection, incision and drainage of the left foot, peroneus brevis to peroneus longus anastomosis.  Patient underwent bone resection and was sent to pathology for cultures.  Wound VAC was placed to the left foot.  As per podiatry if pathology comes back with dirty margin showing inflammatory cells and signs of infection patient will need prolonged IV antibiotics and ID will need to be consulted.  Patient to maintain nonweightbearing to the left lower extremity at all times. Physical therapy evaluation ordered, recommending home health PT.  Assessment & Plan:   Active Problems:   Type 2 diabetes mellitus with hyperlipidemia (HCC)   Diabetic foot ulcer (HCC)   Osteomyelitis of the fifth metatarsal bone and left foot abscess with surrounding cellulitis Initially started on broad-spectrum IV antibiotics, continue the same,  follow wound cultures  and narrow the antibiotics. Appreciate podiatry recommendations , underwent left partial fifth metatarsal base amputation/resection, incision and drainage of the left foot, peroneus brevis to peroneus longus anastomosis. Pain control.  Patient underwent bone resection and was sent to pathology for cultures.  Wound VAC was placed to the left foot.  As per podiatry if pathology comes back with dirty margin showing inflammatory cells and signs of infection patient will need prolonged IV antibiotics and ID will need to be consulted.  Patient to maintain nonweightbearing to the left lower extremity at all times. Patient remains afebrile and WBC count within normal limits. CRP elevated at 15.5, sed rate at 68 Vascular surgery consulted for evaluation of angiography and inadequate circulation.  He is scheduled for angiogram tomorrow.  Uncontrolled diabetes mellitus with hyperglycemia with peripheral neuropathy Hemoglobin A1c at 8. CBG (last 3)  Recent Labs    06/29/20 0804 06/29/20 1202 06/29/20 1647  GLUCAP 122* 192* 182*   Better controlled today Continue with 10 units of Lantus and change sliding scale to moderate and continue with NovoLog 3 units 3 times daily AC.    Hypertensive urgency/uncontrolled hypertension Resolved, blood pressure parameters are well controlled    Hypokalemia Replaced.    Hyperlipidemia Continue with statin.    AKI probably secondary to ATN from infection. Creatinine improved with IV fluids. Creatinine stable at 1.2   Leukocytosis  Resolved WBC within normal limits    Mild normocytic anemia Continue to monitor. Hemoglobin around 11   Patient continues to refuse COVID-19 testing.   DVT prophylaxis: LOVENOX.  Code Status: Full code Family Communication: none at bedside.  Disposition:   Status is: Inpatient  Remains inpatient appropriate because:Ongoing diagnostic  testing needed not appropriate for outpatient work up, IV treatments  appropriate due to intensity of illness or inability to take PO and Inpatient level of care appropriate due to severity of illness   Dispo: The patient is from: Home              Anticipated d/c is to: Home              Anticipated d/c date is: 2 days              Patient currently is not medically stable to d/c.       Consultants:   Podiatry Dr Luana Shu.   Vascular surgery Dr. Trula Slade  Procedures: none.  Antimicrobials:  Anti-infectives (From admission, onward)   Start     Dose/Rate Route Frequency Ordered Stop   06/29/20 1830  Ampicillin-Sulbactam (UNASYN) 3 g in sodium chloride 0.9 % 100 mL IVPB     Discontinue     3 g 200 mL/hr over 30 Minutes Intravenous Every 6 hours 06/29/20 1808     06/29/20 1707  ceFAZolin (ANCEF) IVPB 2g/100 mL premix     Discontinue     2 g 200 mL/hr over 30 Minutes Intravenous 30 min pre-op 06/29/20 1707     06/27/20 1800  vancomycin (VANCOREADY) IVPB 1250 mg/250 mL  Status:  Discontinued        1,250 mg 166.7 mL/hr over 90 Minutes Intravenous Every 12 hours 06/27/20 0952 06/29/20 1747   06/27/20 1636  vancomycin (VANCOCIN) powder  Status:  Discontinued          As needed 06/27/20 1636 06/27/20 1751   06/26/20 2000  vancomycin (VANCOCIN) IVPB 1000 mg/200 mL premix  Status:  Discontinued        1,000 mg 200 mL/hr over 60 Minutes Intravenous Every 8 hours 06/26/20 1350 06/27/20 0952   06/26/20 1000  vancomycin (VANCOREADY) IVPB 1250 mg/250 mL  Status:  Discontinued        1,250 mg 166.7 mL/hr over 90 Minutes Intravenous Every 12 hours 06/25/20 2024 06/26/20 1350   06/26/20 0600  piperacillin-tazobactam (ZOSYN) IVPB 3.375 g  Status:  Discontinued        3.375 g 12.5 mL/hr over 240 Minutes Intravenous Every 8 hours 06/25/20 2024 06/29/20 1747   06/25/20 2100  vancomycin (VANCOREADY) IVPB 2000 mg/400 mL        2,000 mg 200 mL/hr over 120 Minutes Intravenous  Once 06/25/20 2024 06/25/20 2259   06/25/20 1900  piperacillin-tazobactam (ZOSYN) IVPB 4.5 g   Status:  Discontinued        4.5 g 200 mL/hr over 30 Minutes Intravenous  Once 06/25/20 1832 06/25/20 1837   06/25/20 1900  piperacillin-tazobactam (ZOSYN) IVPB 2.25 g        2.25 g 100 mL/hr over 30 Minutes Intravenous Every 30 min 06/25/20 1837 06/25/20 2023   06/25/20 1815  piperacillin-tazobactam (ZOSYN) IVPB 4.5 g  Status:  Discontinued        4.5 g 200 mL/hr over 30 Minutes Intravenous  Once 06/25/20 1801 06/25/20 1837         Subjective: Wants to go home tomorrow after angiogram.  Objective: Vitals:   06/29/20 0410 06/29/20 0820 06/29/20 1204 06/29/20 1649  BP: 128/66 (!) 145/87 132/72 140/76  Pulse: 63 68 73 68  Resp: 18 16 18 16   Temp: 98.1 F (36.7 C) 97.6 F (36.4 C) 98.3 F (36.8 C) 98.3 F (36.8 C)  TempSrc: Oral     SpO2: 99%  98% 95% 97%  Weight:      Height:       No intake or output data in the 24 hours ending 06/29/20 1818 Filed Weights   06/25/20 1629  Weight: 94.5 kg    Examination:   General exam: Alert, comfortable, not in any kind of distress Respiratory system: Clear to auscultation bilaterally, no wheezing or rhonchi Cardiovascular system: S1-S2 heard, regular rate rhythm, no JVD Gastrointestinal system: Abdomen is soft, nontender, nondistended, bowel sounds normal Central nervous system: Alert, oriented, grossly nonfocal. Extremities: Left foot connected to wound VAC. Skin:  Left foot in boot Psychiatry: Mood is appropriate    Data Reviewed: I have personally reviewed following labs and imaging studies  CBC: Recent Labs  Lab 06/25/20 1736 06/26/20 0458 06/27/20 0635 06/29/20 0415  WBC 13.4* 12.8* 8.7 6.8  NEUTROABS 11.0*  --   --  4.4  HGB 12.4* 11.6* 11.7* 11.4*  HCT 35.4* 34.9* 34.4* 35.9*  MCV 89.6 93.6 92.7 96.2  PLT 250 246 281 532    Basic Metabolic Panel: Recent Labs  Lab 06/25/20 1736 06/26/20 0458 06/27/20 0635 06/28/20 0450 06/29/20 0415  NA 134* 136 137  --  138  K 3.5 3.3* 3.9  --  3.9  CL 98 100 104   --  103  CO2 20* 19* 23  --  28  GLUCOSE 187* 103* 207*  --  128*  BUN 37* 29* 32*  --  29*  CREATININE 1.37* 1.15 1.26* 1.29* 1.11  1.19  CALCIUM 9.2 9.0 9.1  --  9.1    GFR: Estimated Creatinine Clearance: 84.3 mL/min (by C-G formula based on SCr of 1.19 mg/dL).  Liver Function Tests: Recent Labs  Lab 06/25/20 1736  AST 14*  ALT 16  ALKPHOS 62  BILITOT 2.5*  PROT 8.0  ALBUMIN 3.6    CBG: Recent Labs  Lab 06/28/20 1707 06/28/20 2118 06/29/20 0804 06/29/20 1202 06/29/20 1647  GLUCAP 223* 144* 122* 192* 182*     Recent Results (from the past 240 hour(s))  Aerobic/Anaerobic Culture (surgical/deep wound)     Status: None (Preliminary result)   Collection Time: 06/25/20  7:08 PM   Specimen: Foot; Tissue  Result Value Ref Range Status   Specimen Description   Final    FOOT Performed at [facility], [address], [place], [place] [address]    Special Requests   Final    Normal Performed at [facility], [address] [place], [place] [address]    Gram Stain   Final    RARE WBC PRESENT, PREDOMINANTLY PMN ABUNDANT GRAM NEGATIVE RODS ABUNDANT GRAM POSITIVE COCCI IN CHAINS IN CLUSTERS    Culture   Final    ABUNDANT STAPHYLOCOCCUS AUREUS WITHIN MIXED ORGANISMS HOLDING FOR POSSIBLE ANAEROBE Performed at [facility], [place] [address], [place], [place] [address]    Report Status PENDING  Incomplete   Organism ID, Bacteria STAPHYLOCOCCUS AUREUS  Final      Susceptibility   Staphylococcus aureus - MIC*    CIPROFLOXACIN <=0.5 SENSITIVE Sensitive     ERYTHROMYCIN <=0.25 SENSITIVE Sensitive     GENTAMICIN <=0.5 SENSITIVE Sensitive     OXACILLIN <=0.25 SENSITIVE Sensitive     TETRACYCLINE <=1 SENSITIVE Sensitive     VANCOMYCIN <=0.5 SENSITIVE Sensitive     TRIMETH/SULFA <=10 SENSITIVE Sensitive     CLINDAMYCIN <=0.25 SENSITIVE Sensitive     RIFAMPIN <=0.5 SENSITIVE Sensitive     Inducible Clindamycin NEGATIVE Sensitive     *  ABUNDANT  STAPHYLOCOCCUS AUREUS  Aerobic/Anaerobic Culture (surgical/deep wound)     Status: None (Preliminary result)   Collection Time: 06/27/20  4:42 PM   Specimen: Wound; Abscess  Result Value Ref Range Status   Specimen Description   Final    WOUND Performed at [facility], [address], [place], [place] [address]    Special Requests   Final    NONE Performed at [facility], [address] [place], [place] [address]    Gram Stain   Final    NO WBC SEEN NO ORGANISMS SEEN Performed at [facility], [place] [address], [place], [place] [address]    Culture   Final    RARE STREPTOCOCCUS MITIS/ORALIS SUSCEPTIBILITIES TO FOLLOW NO ANAEROBES ISOLATED; CULTURE IN PROGRESS FOR 5 DAYS    Report Status PENDING  Incomplete         Radiology Studies: DG Foot Complete Left  Result Date: 06/27/2020 CLINICAL DATA:  Postop check. EXAM: LEFT FOOT - COMPLETE 3+ VIEW COMPARISON:  June 25, 2020 FINDINGS: There are postsurgical changes of the lateral aspect of the foot. There is subcutaneous gas with soft tissue edema and new antibiotic beads. There is no acute displaced fracture or dislocation. There is a small plantar calcaneal spur. There appears to be interval resection of a portion of the base of the fifth metatarsal. Correlation with the patient's operative report is recommended. IMPRESSION: Postsurgical changes as above. Electronically Signed   By: Constance Holster M.D.   On: 06/27/2020 19:34        Scheduled Meds: . amLODipine  10 mg Oral Daily  . aspirin EC  81 mg Oral Daily  . enoxaparin (LOVENOX) injection  40 mg Subcutaneous Q24H  . irbesartan  300 mg Oral Daily   And  . hydrochlorothiazide  25 mg Oral Daily  . insulin aspart  0-15 Units Subcutaneous TID WC  . insulin aspart  0-5 Units Subcutaneous QHS  . insulin aspart  3 Units Subcutaneous TID WC  . insulin glargine  10 Units Subcutaneous Daily  . rosuvastatin  40 mg Oral Daily   Continuous  Infusions: . sodium chloride    . ampicillin-sulbactam (UNASYN) IV    .  ceFAZolin (ANCEF) IV       LOS: 4 days        Hosie Poisson, MD [facility]   To contact the attending provider between 7A-7P or the covering provider during after hours 7P-7A, please log into the web site [email or web address] and access using universal [place] password for that web site. If you do not have the password, please call the hospital operator.  06/29/2020, 6:18 PM

## 2020-06-30 ENCOUNTER — Encounter
Admission: EM | Disposition: A | Discharge status: Home / Self Care | Payer: Managed Care, Other (non HMO) | Source: Home / Self Care | Attending: Internal Medicine

## 2020-06-30 ENCOUNTER — Other Ambulatory Visit (INDEPENDENT_AMBULATORY_CARE_PROVIDER_SITE_OTHER): Payer: Self-pay | Admitting: Vascular Surgery

## 2020-06-30 DIAGNOSIS — E08621 Diabetes mellitus due to underlying condition with foot ulcer: Secondary | ICD-10-CM

## 2020-06-30 DIAGNOSIS — I70262 Atherosclerosis of native arteries of extremities with gangrene, left leg: Secondary | ICD-10-CM

## 2020-06-30 DIAGNOSIS — L97509 Non-pressure chronic ulcer of other part of unspecified foot with unspecified severity: Secondary | ICD-10-CM

## 2020-06-30 DIAGNOSIS — L97424 Non-pressure chronic ulcer of left heel and midfoot with necrosis of bone: Secondary | ICD-10-CM

## 2020-06-30 HISTORY — PX: LOWER EXTREMITY ANGIOGRAPHY: CATH118251

## 2020-06-30 LAB — AEROBIC/ANAEROBIC CULTURE W GRAM STAIN (SURGICAL/DEEP WOUND): Special Requests: NORMAL

## 2020-06-30 LAB — CREATININE, SERUM
Creatinine, Ser: 0.95 mg/dL (ref 0.61–1.24)
GFR calc Af Amer: >60 mL/min (ref >60)
GFR calc non Af Amer: >60 mL/min (ref >60)

## 2020-06-30 LAB — GLUCOSE, CAPILLARY
Glucose-Capillary: 149 mg/dL — ABNORMAL HIGH (ref 70–99)
Glucose-Capillary: 121 mg/dL — ABNORMAL HIGH (ref 70–99)
Glucose-Capillary: 185 mg/dL — ABNORMAL HIGH (ref 70–99)

## 2020-06-30 SURGERY — LOWER EXTREMITY ANGIOGRAPHY
Anesthesia: Moderate Sedation | Laterality: Left

## 2020-06-30 MED ORDER — METHYLPREDNISOLONE SODIUM SUCC 125 MG IJ SOLR: 125.0000 mg | Freq: Once | INTRAMUSCULAR | Status: DC | PRN

## 2020-06-30 MED ORDER — IODIXANOL 320 MG/ML IV SOLN
INTRAVENOUS | Status: DC | PRN
  Administered 2020-06-30: 45 mL via INTRA_ARTERIAL

## 2020-06-30 MED ORDER — MIDAZOLAM HCL 2 MG/ML PO SYRP
8.0000 mg | ORAL_SOLUTION | Freq: Once | ORAL | Status: DC | PRN
  Filled 2020-06-30: qty 4

## 2020-06-30 MED ORDER — SODIUM CHLORIDE 0.9 % IV SOLN: INTRAVENOUS | Status: DC

## 2020-06-30 MED ORDER — FENTANYL CITRATE (PF) 100 MCG/2ML IJ SOLN
INTRAMUSCULAR | Status: AC
  Filled 2020-06-30: qty 2

## 2020-06-30 MED ORDER — MIDAZOLAM HCL 5 MG/5ML IJ SOLN
INTRAMUSCULAR | Status: AC
  Filled 2020-06-30: qty 5

## 2020-06-30 MED ORDER — HYDROMORPHONE HCL 1 MG/ML IJ SOLN: 1.0000 mg | Freq: Once | INTRAMUSCULAR | Status: DC | PRN

## 2020-06-30 MED ORDER — CEFAZOLIN SODIUM-DEXTROSE 2-4 GM/100ML-% IV SOLN
2.0000 g | Freq: Once | INTRAVENOUS | Status: DC
  Filled 2020-06-30: qty 100

## 2020-06-30 MED ORDER — HEPARIN SODIUM (PORCINE) 1000 UNIT/ML IJ SOLN
INTRAMUSCULAR | Status: AC
  Filled 2020-06-30: qty 1

## 2020-06-30 MED ORDER — SODIUM CHLORIDE FLUSH 0.9 % IV SOLN
INTRAVENOUS | Status: AC
  Filled 2020-06-30: qty 10

## 2020-06-30 MED ORDER — MIDAZOLAM HCL 2 MG/2ML IJ SOLN
INTRAMUSCULAR | Status: DC | PRN
  Administered 2020-06-30: 2 mg via INTRAVENOUS

## 2020-06-30 MED ORDER — FAMOTIDINE 20 MG PO TABS: 40.0000 mg | ORAL_TABLET | Freq: Once | ORAL | Status: DC | PRN

## 2020-06-30 MED ORDER — CEFAZOLIN SODIUM-DEXTROSE 2-4 GM/100ML-% IV SOLN
INTRAVENOUS | Status: AC
  Filled 2020-06-30: qty 100

## 2020-06-30 MED ORDER — ONDANSETRON HCL 4 MG/2ML IJ SOLN: 4.0000 mg | Freq: Four times a day (QID) | INTRAMUSCULAR | Status: DC | PRN

## 2020-06-30 MED ORDER — FENTANYL CITRATE (PF) 100 MCG/2ML IJ SOLN
INTRAMUSCULAR | Status: DC | PRN
  Administered 2020-06-30: 50 ug via INTRAVENOUS

## 2020-06-30 MED ORDER — DIPHENHYDRAMINE HCL 50 MG/ML IJ SOLN: 50.0000 mg | Freq: Once | INTRAMUSCULAR | Status: DC | PRN

## 2020-06-30 SURGICAL SUPPLY — 7 items
CATH ANGIO 5F PIGTAIL 65CM (CATHETERS) ×2 IMPLANT
DEVICE STARCLOSE SE CLOSURE (Vascular Products) ×2 IMPLANT
PACK ANGIOGRAPHY (CUSTOM PROCEDURE TRAY) ×2 IMPLANT
SHEATH BRITE TIP 5FRX11 (SHEATH) ×2 IMPLANT
SYR MEDRAD MARK 7 150ML (SYRINGE) ×2 IMPLANT
TUBING CONTRAST HIGH PRESS 72 (TUBING) ×2 IMPLANT
WIRE J 3MM .035X145CM (WIRE) ×2 IMPLANT

## 2020-06-30 NOTE — Progress Notes (Signed)
PT Cancellation Note  Patient Details Name: [name] MRN: [id number] DOB: [birth year]   Cancelled Treatment:      PT attempt. Per RN, Pt off floor for x ray. Acute PT will try to return later this date and will continue to follow per POC.  Willette Pa 06/30/2020, 2:00 PM

## 2020-06-30 NOTE — Progress Notes (Addendum)
PODIATRY / FOOT AND ANKLE SURGERY PROGRESS NOTE  Requesting Physician: Loletha Grayer MD  Reason for consult: Left foot ulcer/infection  Chief Complaint: Left foot ulcer, drainage, infection   HPI: [name] is a 54 y.o. male who presents status post left partial fifth metatarsal base resection, incision and drainage, and peroneal tendon anastomoses with antibiotic bead application.  Patient states he has minimal to no pain today.  Patient recently had wound VAC applied to the left foot as was instructed by wound care.  He has maintained nonweightbearing since the procedure.  Patient presents wearing boot today.  PMHx:  Past Medical History:  Diagnosis Date  . Diabetes mellitus without complication ([place])   . Hyperlipidemia   . Hypertension     Surgical Hx:  Past Surgical History:  Procedure Laterality Date  . BONE BIOPSY Left 06/27/2020   Procedure: BONE BIOPSY;  Surgeon: Caroline More, DPM;  Location: [facility] ORS;  Service: Podiatry;  Laterality: Left;  . INCISION AND DRAINAGE Left 06/27/2020   Procedure: INCISION AND DRAINAGE;  Surgeon: Caroline More, DPM;  Location: [facility] ORS;  Service: Podiatry;  Laterality: Left;  . NO PAST SURGERIES      FHx:  Family History  Problem Relation Age of Onset  . Heart disease Father   . Heart attack Father   . Healthy Mother   . Prostate cancer Neg Hx   . Kidney cancer Neg Hx   . Bladder Cancer Neg Hx     Social History:  reports that he has never smoked. He has never used smokeless tobacco. He reports current alcohol use of about 6.0 - 12.0 standard drinks of alcohol per week. He reports that he does not use drugs.  Allergies: No Known Allergies  Review of Systems: General ROS: negative Psychological ROS: negative Respiratory ROS: no cough, shortness of breath, or wheezing Cardiovascular ROS: no chest pain or dyspnea on exertion Gastrointestinal ROS: no abdominal pain, change in bowel habits, or black or bloody  stools Musculoskeletal ROS: positive for - joint pain and joint swelling Neurological ROS: positive for - numbness/tingling Dermatological ROS: positive for Left fifth metatarsal base incision/wound  Medications Prior to Admission  Medication Sig Dispense Refill  . amLODipine (NORVASC) 10 MG tablet TAKE 1 TABLET(10 MG) BY MOUTH DAILY 90 tablet 1  . aspirin 81 MG tablet Take by mouth.    Marland Kitchen glipiZIDE (GLUCOTROL XL) 10 MG 24 hr tablet Take 1 tablet (10 mg total) by mouth daily with breakfast. 90 tablet 1  . JARDIANCE 25 MG TABS tablet TAKE 1 TABLET BY MOUTH ONCE DAILY 90 tablet 0  . rosuvastatin (CRESTOR) 40 MG tablet TAKE 1 TABLET BY MOUTH EVERY OTHER DAY. USUALLY TAKES MONDAY-FRIDAY 90 tablet 1  . torsemide (DEMADEX) 10 MG tablet Take by mouth.    . valsartan-hydrochlorothiazide (DIOVAN-HCT) 320-25 MG tablet TAKE 1 TABLET BY MOUTH EVERY DAY( IN PLACE OF LISINOPRIL/HCTZ) 90 tablet 0  . Vitamin D, Ergocalciferol, (DRISDOL) 1.25 MG (50000 UNIT) CAPS capsule TAKE ONE CAPSULE BY MOUTH ONCE A WEEK FOR 12 WEEKS 12 capsule 0    Physical Exam: General: Alert and oriented.  No apparent distress.  Vascular: DP pulse right and left palpable, PT pulses nonpalpable bilateral.  Capillary fill time appears to be intact to digits bilateral.  Mild swelling and erythema to the lateral aspect of the left foot, improved since last visit.  Some dusky changes present to the dorsal lateral aspect of the foot left.  Neuro: Light touch sensation absent to  bilateral lower extremities.  Derm: Wound VAC in place over left lateral foot.  Incision line appears to be intact with sutures intact.  Unable to examine open wound at fifth metatarsal base due to wound VAC present.  Decreased erythema and edema overall since admission to the left foot.  Appears to have mild maceration around the periwound and periincisional areas.  MSK: Minimal to no pain on palpation to the left foot at the fifth metatarsal base area.  Cavovarus  foot type bilateral.  Results for orders placed or performed during the hospital encounter of 06/25/20 (from the past 48 hour(s))  Glucose, capillary     Status: Abnormal   Collection Time: 06/28/20  5:07 PM  Result Value Ref Range   Glucose-Capillary 223 (H) 70 - 99 mg/dL    Comment: Glucose reference range applies only to samples taken after fasting for at least 8 hours.  Glucose, capillary     Status: Abnormal   Collection Time: 06/28/20  9:18 PM  Result Value Ref Range   Glucose-Capillary 144 (H) 70 - 99 mg/dL    Comment: Glucose reference range applies only to samples taken after fasting for at least 8 hours.  Creatinine, serum     Status: None   Collection Time: 06/29/20  4:15 AM  Result Value Ref Range   Creatinine, Ser 1.19 0.61 - 1.24 mg/dL   GFR calc non Af Amer >60 >60 mL/min   GFR calc Af Amer >60 >60 mL/min    Comment: Performed at [facility], [address] [place], [place] [address]  Basic metabolic panel     Status: Abnormal   Collection Time: 06/29/20  4:15 AM  Result Value Ref Range   Sodium 138 135 - 145 mmol/L   Potassium 3.9 3.5 - 5.1 mmol/L   Chloride 103 98 - 111 mmol/L   CO2 28 22 - 32 mmol/L   Glucose, Bld 128 (H) 70 - 99 mg/dL    Comment: Glucose reference range applies only to samples taken after fasting for at least 8 hours.   BUN 29 (H) 6 - 20 mg/dL   Creatinine, Ser 1.11 0.61 - 1.24 mg/dL   Calcium 9.1 8.9 - 10.3 mg/dL   GFR calc non Af Amer >60 >60 mL/min   GFR calc Af Amer >60 >60 mL/min   Anion gap 7 5 - 15    Comment: Performed at [facility], [address] [place], [place] [address]  CBC with Differential/Platelet     Status: Abnormal   Collection Time: 06/29/20  4:15 AM  Result Value Ref Range   WBC 6.8 4.0 - 10.5 K/uL   RBC 3.73 (L) 4.22 - 5.81 MIL/uL   Hemoglobin 11.4 (L) 13.0 - 17.0 g/dL   HCT 35.9 (L) 39 - 52 %   MCV 96.2 80.0 - 100.0 fL   MCH 30.6 26.0 - 34.0 pg   MCHC 31.8 30.0 - 36.0 g/dL   RDW 11.4 (L)  11.5 - 15.5 %   Platelets 360 150 - 400 K/uL   nRBC 0.0 0.0 - 0.2 %   Neutrophils Relative % 64 %   Neutro Abs 4.4 1.7 - 7.7 K/uL   Lymphocytes Relative 24 %   Lymphs Abs 1.6 0.7 - 4.0 K/uL   Monocytes Relative 10 %   Monocytes Absolute 0.7 0 - 1 K/uL   Eosinophils Relative 1 %   Eosinophils Absolute 0.1 0 - 0 K/uL   Basophils Relative 1 %  Basophils Absolute 0.0 0 - 0 K/uL   WBC Morphology MORPHOLOGY UNREMARKABLE    RBC Morphology MORPHOLOGY UNREMARKABLE    Smear Review Normal platelet morphology    Immature Granulocytes 0 %   Abs Immature Granulocytes 0.03 0.00 - 0.07 K/uL    Comment: Performed at [facility], [address] [place], [place] [address]  Glucose, capillary     Status: Abnormal   Collection Time: 06/29/20  8:04 AM  Result Value Ref Range   Glucose-Capillary 122 (H) 70 - 99 mg/dL    Comment: Glucose reference range applies only to samples taken after fasting for at least 8 hours.   Comment 1 Notify RN   Glucose, capillary     Status: Abnormal   Collection Time: 06/29/20 12:02 PM  Result Value Ref Range   Glucose-Capillary 192 (H) 70 - 99 mg/dL    Comment: Glucose reference range applies only to samples taken after fasting for at least 8 hours.  Glucose, capillary     Status: Abnormal   Collection Time: 06/29/20  4:47 PM  Result Value Ref Range   Glucose-Capillary 182 (H) 70 - 99 mg/dL    Comment: Glucose reference range applies only to samples taken after fasting for at least 8 hours.  Glucose, capillary     Status: Abnormal   Collection Time: 06/29/20  9:11 PM  Result Value Ref Range   Glucose-Capillary 146 (H) 70 - 99 mg/dL    Comment: Glucose reference range applies only to samples taken after fasting for at least 8 hours.  Creatinine, serum     Status: None   Collection Time: 06/30/20  4:24 AM  Result Value Ref Range   Creatinine, Ser 0.95 0.61 - 1.24 mg/dL   GFR calc non Af Amer >60 >60 mL/min   GFR calc Af Amer >60 >60 mL/min    Comment:  Performed at [facility], [address] [place], [place] [address]  Glucose, capillary     Status: Abnormal   Collection Time: 06/30/20  8:30 AM  Result Value Ref Range   Glucose-Capillary 149 (H) 70 - 99 mg/dL    Comment: Glucose reference range applies only to samples taken after fasting for at least 8 hours.   Comment 1 Notify RN   Glucose, capillary     Status: Abnormal   Collection Time: 06/30/20 11:33 AM  Result Value Ref Range   Glucose-Capillary 121 (H) 70 - 99 mg/dL    Comment: Glucose reference range applies only to samples taken after fasting for at least 8 hours.   Comment 1 Notify RN    No results found.  Blood pressure (!) 166/88, pulse (!) 106, temperature 98.4 F (36.9 C), temperature source Oral, resp. rate 20, height 5\' 11"  (1.803 m), weight 94.5 kg, SpO2 98 %.  Assessment 1. Left fifth metatarsal base osteomyelitis with associated ulceration with abscess formation with associated cellulitis status post I&D with partial fifth metatarsal base resection, antibiotic bead application, and peroneal tendon anastomosis 2. Diabetes type 2 polyneuropathy 3. PVD  Plan -Patient seen and examined. -Culture results reviewed and discussed with patient in detail.  Bone appeared to grow strep bacteria.  Bone biopsy also appeared to show that the margin did have active acute inflammation consistent with potential osteomyelitis.  Discussed with infectious disease in detail. -Appreciate recommendations from infectious disease.  Patient will likely require IV antibiotics.  -Patient had hard bone at the fifth metatarsal base.   -Wound VAC in place to the  left foot over the residual fifth metatarsal base wound.  Discussed with patient that this wound may take a long time to heal.  Okay at next [facility] change to remove the sutures that are holding the Adaptic in place and to remove the Adaptic and then place wound VAC directly on wound.  Will relay instructions to wound  care. -Appreciate vascular recommendations.  Patient is scheduled to undergo CT angiogram with potential intervention. -Patient is to maintain nonweightbearing to left lower extremity at all times.  PT consultation has been ordered. -Patient is to use the boot when up and transferring but can remove the boot when at rest.  Instructed and stressed importance of staying nonweightbearing to the patient on wound healing. -Patient also needs to improve blood glucose control.  If sugars remain elevated he will have a hard time healing this wound. -Patient is very concerned about going to work as he walks a lot.  Instructed patient that this wound could take a number of months to heal from. -Instructed patient that he may require additional surgery if antibiotics and antibiotic beads fail to resolve his early acute osteomyelitis.  Discussed with patient that this would likely require removal of the entire fifth metatarsal base which could result in a worsening foot deformity.  Patient would like to proceed conservatively still at this time. -Since he should not be WB on the L foot, rec he continue DVT prophylaxis after discharge.  Appreciate medicine recs.  Podiatry team to sign off at this time.  Discharge instructions placed in chart.  Reconsult if any problems arise.  Caroline More, DPM 06/30/2020, 1:08 PM

## 2020-06-30 NOTE — Progress Notes (Signed)
PROGRESS NOTE    [name]  GGE:[id number] DOB: [birth year] DOA: 06/25/2020 PCP: [name], MD   Brief Narrative: [name] is a 54 y.o. male with a history of diabetes, hypertension, hyperlipidemia, peripheral neuropathy. Patient presented secondary to a non-healing left foot ulcer and was found to have osteomyelitis, a foot abscess and cellulitis requiring left fifth metatarsal base resection and I&D. Unfortunately, margins were not clear and patient will require weeks of IV antibiotics   Assessment & Plan:   Active Problems:   Type 2 diabetes mellitus with hyperlipidemia (HCC)   Diabetic foot ulcer (HCC)    Diabetic foot ulcer Left fifth metatarsal bone osteomyelitis Left foot abscess Left foot cellulitis Patient underwent left partial fifth metatarsal base resection/I&D on 7/18 without clean margins; wound culture growing MSSA. Patient initially managed on Vancomycin IV and Zosyn IV which was transitioned to Unasyn. ID consulted and plan complicated by patient's initial desire to not have home IV antibiotics. Now it appears he has changed his mind. No arterial disease requiring intervention seen on angiogram per vascular surgery. -ID recommendations for outpatient antibiotic regimen -Podiatry recommendations: wound vac, NWB  Leukocytosis Resolved with antibiotics  Diabetes mellitus, type 2 Hemoglobin A1C of 8.0% -Continue Lantus, SSI  Hyperlipidemia -Continue Crestor  Hypertension -Continue amlodipine, hydrochlorothiazide   DVT prophylaxis: Lovenox Code Status:   Code Status: Full Code Family Communication: None at bedside Disposition Plan: Discharge home likely in 24-48 hours pending ability for patient to obtain outpatient antibiotic regimen   Consultants:   Podiatry  Vascular surgery  Procedures:   PODIATRY OP (06/27/2020) PROCEDURE(S): 1.  Left partial fifth metatarsal base amputation/resection 2.  Incision and drainage left foot 3.   Peroneus brevis to peroneus longus anastomosis  VASCULAR SURGERY OP (06/30/2020) Procedure(s) Performed: 1. Ultrasound guidance for vascular access right femoral artery 2. Catheter placement into left SFA from right femoral approach 3. Aortogram and selective left lower extremity angiogram 4. StarClose closure device right femoral artery   Antimicrobials:  Vancomycin  Zosyn  Unasyn    Subjective: No issues today. Foot feels good. Eager to go home. Considering home IV antibiotics as he has a family member that can help.  Objective: Vitals:   06/30/20 1500 06/30/20 1510 06/30/20 1515 06/30/20 1953  BP: (!) 164/81 (!) 159/81 (!) 165/81 126/72  Pulse:   81 69  Resp: 20 14 15 20   Temp:   98.4 F (36.9 C) 97.7 F (36.5 C)  TempSrc:   Oral Oral  SpO2: 99% 95% 96% 99%  Weight:      Height:        Intake/Output Summary (Last 24 hours) at 06/30/2020 2200 Last data filed at 06/30/2020 1503 Gross per 24 hour  Intake 541.36 ml  Output --  Net 541.36 ml   Filed Weights   06/25/20 1629  Weight: 94.5 kg    Examination:  General exam: Appears calm and comfortable Respiratory system:  Respiratory effort normal. Gastrointestinal system: Abdomen is nondistended, soft and nontender. No organomegaly or masses felt. Central nervous system: Alert and oriented. No focal neurological deficits. Musculoskeletal: No edema. No calf tenderness. Left foot in walker Skin: No cyanosis. No rashes Psychiatry: Judgement and insight appear normal. Mood & affect appropriate.     Data Reviewed: I have personally reviewed following labs and imaging studies  CBC Lab Results  Component Value Date   WBC 6.8 06/29/2020   RBC 3.73 (L) 06/29/2020   HGB 11.4 (L) 06/29/2020   HCT 35.9 (L)  06/29/2020   MCV 96.2 06/29/2020   MCH 30.6 06/29/2020   PLT 360 06/29/2020   MCHC 31.8 06/29/2020   RDW 11.4 (L) 06/29/2020   LYMPHSABS 1.6 06/29/2020    MONOABS 0.7 06/29/2020   EOSABS 0.1 06/29/2020   BASOSABS 0.0 11/94/1740     Last metabolic panel Lab Results  Component Value Date   NA 138 06/29/2020   K 3.9 06/29/2020   CL 103 06/29/2020   CO2 28 06/29/2020   BUN 29 (H) 06/29/2020   CREATININE 0.95 06/30/2020   GLUCOSE 128 (H) 06/29/2020   GFRNONAA >60 06/30/2020   GFRAA >60 06/30/2020   CALCIUM 9.1 06/29/2020   PROT 8.0 06/25/2020   ALBUMIN 3.6 06/25/2020   LABGLOB 2.9 06/19/2016   AGRATIO 1.4 06/19/2016   BILITOT 2.5 (H) 06/25/2020   ALKPHOS 62 06/25/2020   AST 14 (L) 06/25/2020   ALT 16 06/25/2020   ANIONGAP 7 06/29/2020    CBG (last 3)  Recent Labs    06/30/20 0830 06/30/20 1133 06/30/20 2113  GLUCAP 149* 121* 185*     GFR: Estimated Creatinine Clearance: 105.6 mL/min (by C-G formula based on SCr of 0.95 mg/dL).  Coagulation Profile: No results for input(s): INR, PROTIME in the last 168 hours.  Recent Results (from the past 240 hour(s))  Aerobic/Anaerobic Culture (surgical/deep wound)     Status: None   Collection Time: 06/25/20  7:08 PM   Specimen: Foot; Tissue  Result Value Ref Range Status   Specimen Description   Final    FOOT Performed at [facility], [address] [place], [place] [address]    Special Requests   Final    Normal Performed at [facility], [address] [place], [place] [address]    Gram Stain   Final    RARE WBC PRESENT, PREDOMINANTLY PMN ABUNDANT GRAM NEGATIVE RODS ABUNDANT GRAM POSITIVE COCCI IN CHAINS IN CLUSTERS    Culture   Final    ABUNDANT STAPHYLOCOCCUS AUREUS WITHIN MIXED ORGANISMS ABUNDANT PREVOTELLA BIVIA BETA LACTAMASE POSITIVE Performed at [facility], [place] [address] [place], [place] [address]    Report Status 06/30/2020 FINAL  Final   Organism ID, Bacteria STAPHYLOCOCCUS AUREUS  Final      Susceptibility   Staphylococcus aureus - MIC*    CIPROFLOXACIN <=0.5 SENSITIVE Sensitive     ERYTHROMYCIN <=0.25 SENSITIVE  Sensitive     GENTAMICIN <=0.5 SENSITIVE Sensitive     OXACILLIN <=0.25 SENSITIVE Sensitive     TETRACYCLINE <=1 SENSITIVE Sensitive     VANCOMYCIN <=0.5 SENSITIVE Sensitive     TRIMETH/SULFA <=10 SENSITIVE Sensitive     CLINDAMYCIN <=0.25 SENSITIVE Sensitive     RIFAMPIN <=0.5 SENSITIVE Sensitive     Inducible Clindamycin NEGATIVE Sensitive     * ABUNDANT STAPHYLOCOCCUS AUREUS  Aerobic/Anaerobic Culture (surgical/deep wound)     Status: None (Preliminary result)   Collection Time: 06/27/20  4:42 PM   Specimen: Wound; Abscess  Result Value Ref Range Status   Specimen Description   Final    WOUND Performed at [facility], [address] [place], [place] [address]    Special Requests   Final    NONE Performed at [facility], [address] [place], [place] [address]    Gram Stain   Final    NO WBC SEEN NO ORGANISMS SEEN Performed at [facility], [address], [place], [place] [address]    Culture   Final  RARE STREPTOCOCCUS MITIS/ORALIS RARE GROUP B STREP(S.AGALACTIAE)ISOLATED TESTING AGAINST S. AGALACTIAE NOT ROUTINELY PERFORMED DUE TO PREDICTABILITY OF AMP/PEN/VAN SUSCEPTIBILITY. NO ANAEROBES ISOLATED; CULTURE IN PROGRESS FOR 5 DAYS    Report Status PENDING  Incomplete   Organism ID, Bacteria STREPTOCOCCUS MITIS/ORALIS  Final      Susceptibility   Streptococcus mitis/oralis - MIC*    TETRACYCLINE <=0.25 SENSITIVE Sensitive     VANCOMYCIN 0.5 SENSITIVE Sensitive     CLINDAMYCIN <=0.25 SENSITIVE Sensitive     PENICILLIN Value in next row Sensitive      SENSITIVE0.06    CEFTRIAXONE Value in next row Sensitive      SENSITIVE0.12    * RARE STREPTOCOCCUS MITIS/ORALIS        Radiology Studies: PERIPHERAL VASCULAR CATHETERIZATION  Result Date: 06/30/2020 See op note       Scheduled Meds: . amLODipine  10 mg Oral Daily  . aspirin EC  81 mg Oral Daily  . enoxaparin (LOVENOX) injection  40 mg Subcutaneous Q24H  . irbesartan  300  mg Oral Daily   And  . hydrochlorothiazide  25 mg Oral Daily  . insulin aspart  0-15 Units Subcutaneous TID WC  . insulin aspart  0-5 Units Subcutaneous QHS  . insulin aspart  3 Units Subcutaneous TID WC  . insulin glargine  10 Units Subcutaneous Daily  . midazolam      . rosuvastatin  40 mg Oral Daily  . sodium chloride flush       Continuous Infusions: . sodium chloride 75 mL/hr at 06/30/20 1213  . ampicillin-sulbactam (UNASYN) IV 3 g (06/30/20 1916)     LOS: 5 days     Cordelia Poche, MD [facility] 06/30/2020, 10:00 PM  If 7PM-7AM, please contact night-coverage [email or web address]

## 2020-06-30 NOTE — Op Note (Addendum)
[facility]  Percutaneous Study/Intervention Procedural Note   Date of Surgery: 06/30/2020  Surgeon(s):Mounir Skipper    Assistants:none  Pre-operative Diagnosis: PAD with gangrene left foot  Post-operative diagnosis:  Same  Procedure(s) Performed:             1.  Ultrasound guidance for vascular access right femoral artery             2.  Catheter placement into left SFA from right femoral approach             3.  Aortogram and selective left lower extremity angiogram             4.  StarClose closure device right femoral artery  EBL: 5 cc  Contrast: 45 cc  Fluoro Time: 1.7 minutes  Moderate Conscious Sedation Time: approximately 20 minutes using 2 mg of Versed and 50 mcg of Fentanyl              Indications:  Patient is a 54 y.o.male with gangrenous toe on the left foot status post surgery to remove the dead tissue. The patient is brought in for angiography for further evaluation and potential treatment.  Due to the limb threatening nature of the situation, angiogram was performed for attempted limb salvage. The patient is aware that if the procedure fails, amputation would be expected.  The patient also understands that even with successful revascularization, amputation may still be required due to the severity of the situation. Risks and benefits are discussed and informed consent is obtained.   Procedure:  The patient was identified and appropriate procedural time out was performed.  The patient was then placed supine on the table and prepped and draped in the usual sterile fashion. Moderate conscious sedation was administered during a face to face encounter with the patient throughout the procedure with my supervision of the RN administering medicines and monitoring the patient's vital signs, pulse oximetry, telemetry and mental status throughout from the start of the procedure until the patient was taken to the recovery room. Ultrasound was used to evaluate the  right common femoral artery.  It was patent .  A digital ultrasound image was acquired.  A Seldinger needle was used to access the right common femoral artery under direct ultrasound guidance and a permanent image was performed.  A 0.035 J wire was advanced without resistance and a 5Fr sheath was placed.  Pigtail catheter was placed into the aorta and an AP aortogram was performed. This demonstrated normal renal arteries and normal aorta and iliac segments without significant stenosis. I then crossed the aortic bifurcation and advanced to the left femoral head.  To better opacify the tibial vessels, the catheter was advanced in the mid left superficial femoral artery for the final 2 images.  Selective left lower extremity angiogram was then performed. This demonstrated normal common femoral artery, profunda femoris artery, and superficial femoral and popliteal arteries.  The tibials started with a very large vessel going straight down that appeared to be the peroneal proximally and what appeared to be the anterior tibial coming off lateral to this.  Following these down to the foot, the large vessel actually coursed medially and the typical course of the posterior tibial artery and was patent into the midfoot where there was small vessel disease at that point.  What appeared to be the anterior tibial artery at the takeoff actually coursed as the peroneal artery with a typical termination at the ankle which was feeding some small  collaterals into the foot.  No true anterior tibial artery was seen.  This appeared to be a congenital configuration but no vascular disease requiring treatment was present.  I elected to terminate the procedure. The sheath was removed and StarClose closure device was deployed in the right femoral artery with excellent hemostatic result. The patient was taken to the recovery room in stable condition having tolerated the procedure well.  Findings:               Aortogram:  The renal  arteries were widely patent.  The aorta and iliac arteries were widely patent without significant stenosis             Left lower Extremity:  Normal common femoral artery, profunda femoris artery, and superficial femoral and popliteal arteries.  The tibials started with a very large vessel going straight down that appeared to be the peroneal proximally and what appeared to be the anterior tibial coming off lateral to this.  Following these down to the foot, the large vessel actually coursed medially and the typical course of the posterior tibial artery and was patent into the midfoot where there was small vessel disease at that point.  What appeared to be the anterior tibial artery at the takeoff actually coursed as the peroneal artery with a typical termination at the ankle which was feeding some small collaterals into the foot.  No true anterior tibial artery was seen.  This appeared to be a congenital configuration but no vascular disease requiring treatment was present.   Disposition: Patient was taken to the recovery room in stable condition having tolerated the procedure well.  Complications: None  Leotis Pain 06/30/2020 2:52 PM   This note was created with Dragon Medical transcription system. Any errors in dictation are purely unintentional.

## 2020-06-30 NOTE — Consult Note (Addendum)
[place] Nurse Consult Note: Reason for Consult: Pt is on isolation for Covid: followed by podiatry for post-op wound. Vac dressing changed. Pt denied need for pain medicine and tolerated procedure without pain.  Wound type: Left foot with full thickness post-op wound; Adaptic dressing remains stapled in place over antibiotic beads; unable to assess wound. There are sutures which extend beyond the Adaptic and they are intact and well-approximated; there is old dark red dried blood to the center area. There is a full thickness wound now visible to the outer foot near the Adaptic; 3X3X.1cm, red and moist.  Wound edges are grey, macerated, and beginning to lift and peel from previous moisture damage.  Dressing procedure/placement/frequency: Applied barrier ring around Adaptic to attempt to maintain a seal, then Mepitel contact layer to protect all the areas and applied one piece black foam to 160mm cont suction.  Applied ace wrap and post-op boot immobilizer. [place] team will plan to change Vac dressing M/W/F while patient is in the hospital.  Pt took a photo on his phone which will be available for the physician to review the wound appearance if desired.  Julien Girt MSN, RN, [place], [place], [place]

## 2020-06-30 NOTE — Discharge Instructions (Signed)
Podiatry discharge instructions: 1.  Keep surgical dressings clean, dry, and intact.  Wound VAC dressing should be changed 3 times weekly. 2.  Take antibiotics as prescribed per infectious disease. 3.  Take DVT prophylactic medications as prescribed. 4.  Remain nonweightbearing to left lower extremity at all times.  You may remove the boot when at rest but if up and moving around or transferring need to be wearing the boot to protect the foot.

## 2020-07-01 ENCOUNTER — Encounter: Payer: Self-pay | Admitting: Vascular Surgery

## 2020-07-01 DIAGNOSIS — E1165 Type 2 diabetes mellitus with hyperglycemia: Secondary | ICD-10-CM

## 2020-07-01 DIAGNOSIS — M869 Osteomyelitis, unspecified: Secondary | ICD-10-CM

## 2020-07-01 DIAGNOSIS — E11628 Type 2 diabetes mellitus with other skin complications: Secondary | ICD-10-CM

## 2020-07-01 DIAGNOSIS — L089 Local infection of the skin and subcutaneous tissue, unspecified: Secondary | ICD-10-CM

## 2020-07-01 LAB — GLUCOSE, CAPILLARY
Glucose-Capillary: 146 mg/dL — ABNORMAL HIGH (ref 70–99)
Glucose-Capillary: 183 mg/dL — ABNORMAL HIGH (ref 70–99)

## 2020-07-01 MED ORDER — METRONIDAZOLE 500 MG PO TABS: 500.0000 mg | ORAL_TABLET | Freq: Three times a day (TID) | ORAL | 0 refills | Status: AC

## 2020-07-01 MED ORDER — SODIUM CHLORIDE 0.9 % IV SOLN
2.0000 g | INTRAVENOUS | Status: AC
  Administered 2020-07-01: 2 g via INTRAVENOUS
  Filled 2020-07-01: qty 2

## 2020-07-01 MED ORDER — METRONIDAZOLE 500 MG PO TABS
500.0000 mg | ORAL_TABLET | Freq: Three times a day (TID) | ORAL | Status: DC
  Administered 2020-07-01: 16:00:00 500 mg via ORAL
  Filled 2020-07-01: qty 1

## 2020-07-01 MED ORDER — DALVANCE 500 MG IV SOLR: 500.0000 mg | INTRAVENOUS | 4 refills | Status: AC

## 2020-07-01 NOTE — Consult Note (Addendum)
[place] Nurse Consult Note: Reason for Consult:Pt is on isolation for Covid: followed by podiatry for post-op wound. Vac dressing changed. Pt denied need for pain medicine and tolerated procedure without pain. Removed sutures holding Adaptic in place as ordered.  Full thickness wound is approx 2X2cm with white antibiotic beads in place.  Left foot with full thickness post-op wound; there are sutures which extendand they are intact and well-approximated; there is old dark red dried blood to the center area.There is a full thickness wound to the outer foot 3X3X.1cm, red and moist.  Wound edges are grey, macerated, and beginning to lift and peel from previous moisture damage.  Dressing procedure/placement/frequency:Applied Mepitel contact layer and barrier ring to attempt to maintain a seal, then 1 piece black foam and  Medella portable negative pressure machine to 175mm cont suction. Applied ace wrap and post-op boot immobilizer; pt planning to discharge today with home health assistance. Pt took a photo on his phone which will be available for the physician to review the wound appearance if desired.  Please re-consult if further assistance is needed.  Thank-you,  Julien Girt MSN, [place], [place], [place], [place]

## 2020-07-01 NOTE — Progress Notes (Addendum)
[facility] Daily Progress Note   Subjective: 1. Ultrasound guidance for vascular access right femoral artery 2. Catheter placement into left SFA from right femoral approach 3. Aortogram and selective left lower extremity angiogram 4. StarClose closure device right femoral artery  Patient without complaint this AM.  No issues overnight.  Objective: Vitals:   06/30/20 1510 06/30/20 1515 06/30/20 1953 07/01/20 0408  BP: (!) 159/81 (!) 165/81 126/72 (!) 146/74  Pulse:  81 69 61  Resp: 14 15 20 18   Temp:  98.4 F (36.9 C) 97.7 F (36.5 C) 98.1 F (36.7 C)  TempSrc:  Oral Oral Oral  SpO2: 95% 96% 99% 98%  Weight:      Height:        Intake/Output Summary (Last 24 hours) at 07/01/2020 1043 Last data filed at 07/01/2020 0200 Gross per 24 hour  Intake 501.36 ml  Output 650 ml  Net -148.64 ml   Physical Exam: A&Ox3, NAD CV: RRR Pulmonary: CTA Bilaterally Abdomen: Soft, Nontender, Nondistended Right groin:  Access site: Dressing clean dry intact.  No swelling or drainage noted. Vascular:  Left lower extremity: Thigh soft.  Calf soft.  Podiatry dressing left in place.  Toes are warm.  Unable to assess pedal pulses however capillary refill adequate.   Laboratory: CBC    Component Value Date/Time   WBC 6.8 06/29/2020 0415   HGB 11.4 (L) 06/29/2020 0415   HCT 35.9 (L) 06/29/2020 0415   PLT 360 06/29/2020 0415   BMET    Component Value Date/Time   NA 138 06/29/2020 0415   NA 135 06/19/2016 1655   K 3.9 06/29/2020 0415   CL 103 06/29/2020 0415   CO2 28 06/29/2020 0415   GLUCOSE 128 (H) 06/29/2020 0415   BUN 29 (H) 06/29/2020 0415   BUN 28 (H) 06/19/2016 1655   CREATININE 0.95 06/30/2020 0424   CREATININE 1.17 11/28/2019 0000   CALCIUM 9.1 06/29/2020 0415   GFRNONAA >60 06/30/2020 0424   GFRNONAA 71 11/28/2019 0000   GFRAA >60 06/30/2020 0424   GFRAA 83 11/28/2019 0000    Assessment/Planning: The patient is a 54 year old male with multiple medical issues status post left lower extremity angiogram - POD#1  1) Left Lower Extremity: Angiogram was notable for no significant vascular disease   2) Hyperlipidemia: On aspirin and statin for medical management Encouraged good control as its slows the progression of atherosclerotic disease  3) Dispo planning: Since no significant vascular disease was found on angiogram the patient does not need to follow-up with [place] on discharge. At this time, vascular surgery will sign off.  Please reconsult if needed.  Discussed with Dr. Lucky Cowboy  Of note: The patient's eyeglasses are found down in specials.  They returned to the patient.  Marcelle Overlie PA-C 07/01/2020 10:43 AM

## 2020-07-01 NOTE — Treatment Plan (Signed)
Diagnosis: Osteomyelitis of the left foot Baseline Creatinine 1  Culture Result: MSSA, GBS  No Known Allergies  OPAT Orders Discharge antibiotics: Dalbavancin 1 gram IV one dose  Followed after 7 days by  554m once a week X 4 weeks thru peripheral line--End Date:07/30/20  PO flagyl 5054mThree times a day until 07/15/20    Labs weekly while on IV antibiotics: _X_ CBC with differential _X_ CMP _X_ CRP _X_ ESR   Fax weekly labs to [phone] Follow Up Appt: 3 weeks  Call [phone] make appt

## 2020-07-01 NOTE — Progress Notes (Signed)
PT Cancellation Note  Patient Details Name: [name] MRN: [id number] DOB: [birth year]   Cancelled Treatment:     PT attempt. Upon going to room, MD gowning about to enter pt's room  and reports pt will be d/cing and does not need PT session prior.    Willette Pa 07/01/2020, 4:02 PM

## 2020-07-01 NOTE — TOC Transition Note (Signed)
Transition of Care [facility]) - CM/SW Discharge Note   Patient Details  Name: [name] MRN: [id number] Date of Birth: 08-11-1966  Transition of Care [facility]) CM/SW Contact:  Shelbie Hutching, RN Phone Number: 07/01/2020, 1:37 PM   Clinical Narrative:    Patient is medically stable to discharge home today.  Patient will be getting weekly infusion at home for antibiotic therapy through [facility], [facility] will provide nursing for the infusion.  Patient will also need home health for wound vac changes 3 times per week.  [employer] has provided the wound vac and the WOC RN is applying it today before he goes home.  Patient's wife will be picking him up.   Patient initially wanted to have the wound vac changes at the outpatient clinic but they are unable to provide those services at this time.  This RNCM has reached out to [facility] to assist with finding home health services.    Final next level of care: Home w Home Health Services Barriers to Discharge: Barriers Resolved   Patient Goals and CMS Choice   CMS Medicare.gov Compare Post Acute Care list provided to:: Patient Choice offered to / list presented to : Patient  Discharge Placement                       Discharge Plan and Services   Discharge Planning Services: CM Consult            DME Arranged: Negative pressure wound device, Walker rolling DME Agency: [facility] Date DME Agency Contacted: 07/01/20 Time DME Agency Contacted: [id number] Representative spoke with at DME Agency: [place]: RN       Representative spoke with at [place]: Myna Bright with [employer]  Social Determinants of Health ([place]) Interventions     Readmission Risk Interventions No flowsheet data found.

## 2020-07-01 NOTE — TOC Progression Note (Signed)
Transition of Care [facility]) - Progression Note    Patient Details  Name: [name] MRN: [id number] Date of Birth: Jul 17, 1966  [facility]) CM/SW Contact  Shelbie Hutching, RN Phone Number: 07/01/2020, 4:18 PM  Clinical Narrative:    [facility] home health will provide wound care and infusion for the patient.  Home Health order faxed to [facility] [phone].   Expected Discharge Plan: Home/Self Care Barriers to Discharge: Barriers Resolved  Expected Discharge Plan and Services Expected Discharge Plan: Home/Self Care   Discharge Planning Services: CM Consult   Living arrangements for the past 2 months: Single Family Home                 DME Arranged: Negative pressure wound device, Walker rolling DME Agency: [facility] Date DME Agency Contacted: 07/01/20 Time DME Agency Contacted: [id number] Representative spoke with at DME Agency: [place]: RN       Representative spoke with at [place]: Myna Bright with [place] Determinants of Health (SDOH) Interventions    Readmission Risk Interventions No flowsheet data found.

## 2020-07-01 NOTE — Progress Notes (Signed)
PHARMACY CONSULT NOTE FOR:  OUTPATIENT  PARENTERAL ANTIBIOTIC THERAPY (OPAT)  Indication: Foot osteomyelitis Regimen: dalbavancin 1gm IVPB x 1 then one week later begin dalbavancin 500mg  weekly x 4 doses End date: 07/30/2020  (also to get metronidazole 500mg  PO TID until 07/15/2020)   IV antibiotic discharge orders are pended. To discharging provider:  please sign these orders via discharge navigator,  Select New Orders & click on the button choice - Manage This Unsigned Work.     Thank you for allowing pharmacy to be a part of this patient's care.  Doreene Eland, PharmD, BCPS.   Work Cell: [phone] 07/01/2020 12:06 PM

## 2020-07-01 NOTE — Progress Notes (Signed)
ID Pt doing better No complaints Had angio yesterday Patient Vitals for the past 24 hrs:  BP Temp Temp src Pulse Resp SpO2  07/01/20 1255 (!) 136/82 (!) 97.5 F (36.4 C) Oral 66 14 100 %  07/01/20 0408 (!) 146/74 98.1 F (36.7 C) Oral 61 18 98 %  06/30/20 1953 126/72 97.7 F (36.5 C) Oral 69 20 99 %  06/30/20 1515 (!) 165/81 98.4 F (36.9 C) Oral 81 15 96 %  06/30/20 1510 (!) 159/81 -- -- -- 14 95 %  06/30/20 1500 (!) 164/81 -- -- -- 20 99 %  06/30/20 1450 (!) 167/78 -- -- -- (!) 22 98 %  06/30/20 1445 (!) 177/83 -- -- -- (!) 23 99 %  06/30/20 1440 (!) 176/85 -- -- -- (!) 22 99 %  06/30/20 1435 (!) 176/86 -- -- -- (!) 22 99 %  06/30/20 1430 (!) 184/87 -- -- -- (!) 25 99 %  06/30/20 1408 126/72 98.1 F (36.7 C) Oral 68 20 100 %    Looks well Chest b/l air entry [id number] Left foot Surgical site Wound some maceration    Before surgery    CBC Latest Ref Rng & Units 06/29/2020 06/27/2020 06/26/2020  WBC 4.0 - 10.5 K/uL 6.8 8.7 12.8(H)  Hemoglobin 13.0 - 17.0 g/dL 11.4(L) 11.7(L) 11.6(L)  Hematocrit 39 - 52 % 35.9(L) 34.4(L) 34.9(L)  Platelets 150 - 400 K/uL 360 281 246    CMP Latest Ref Rng & Units 06/30/2020 06/29/2020 06/29/2020  Glucose 70 - 99 mg/dL - 128(H) -  BUN 6 - 20 mg/dL - 29(H) -  Creatinine 0.61 - 1.24 mg/dL 0.95 1.11 1.19  Sodium 135 - 145 mmol/L - 138 -  Potassium 3.5 - 5.1 mmol/L - 3.9 -  Chloride 98 - 111 mmol/L - 103 -  CO2 22 - 32 mmol/L - 28 -  Calcium 8.9 - 10.3 mg/dL - 9.1 -  Total Protein 6.5 - 8.1 g/dL - - -  Total Bilirubin 0.3 - 1.2 mg/dL - - -  Alkaline Phos 38 - 126 U/L - - -  AST 15 - 41 U/L - - -  ALT 0 - 44 U/L - - -    Micro Superficial culture MSSa and prevotella Surgical culture- Strep mitis/GBS  ? Impression/Recommendation ? ?Left foot ulcer with osteomyelitis of the fifth metatarsal base.  Status post partial resection of the metatarsal base.  Staph aureus in superficial culture aand  Streptococcus mitis in deep  culture. Bone  pathology shows active osteomyelitis and margin has osteomyelitis. Patient will have to continue IV antibiotics for a few weeks.  But he does not want to do IV at home.  He chose to do once a week dalbavancin for 5 weeks total and PO flagyl 500mg  TID for 2 weeks only until 07/15/20 Told patient that he cannot drink any alcohol while on metronidazole Diabetes mellitus on insulin  ?Hypertension onAmlodipine and irbesartan and hydrochlorothiazide  Hyperlipidemia on rosuvastatin  Discussed the management with the patient in detail. Discussed with care team Follow up appt with me on 07/22/20 at 9.30am

## 2020-07-01 NOTE — Discharge Summary (Signed)
Physician Discharge Summary  JERMAYNE SWEENEY AYT:[id number] DOB: Apr 23, 1966 DOA: 06/25/2020  PCP: [name], MD  Admit date: 06/25/2020 Discharge date: 07/01/2020  Admitted From: Home Disposition: Home  Recommendations for Outpatient Follow-up:  1. Follow up with PCP in 1 week 2. Follow up with Podiatry 3. Follow up with infectious disease in 3 weeks 4. Home health for IV antibiotic therapy 5. Non-weight bearing of left foot 6. Please follow up on the following pending results: None  Home Health: RN Equipment/Devices: None  Discharge Condition: Stable CODE STATUS: Full code Diet recommendation: Heart healthy/carb modified   Brief/Interim Summary:  Admission HPI written by Loletha Grayer, MD   HISTORY OF PRESENT ILLNESS: Neko Boyajian  is a 54 y.o. male with a known history of diabetes, hypertension hyperlipidemia presents with a foot ulcer that is been going on for about a year.  Started off as a callus.  He states it has been draining pus and blood clots for about 3 months.  The foot started getting some swelling with some swelling of his right leg going on for the past week.  At times with walking he can have 9 out of 10 pain in his foot but at rest he is having no pain whatsoever.  In the ER he was found to elevate elevated white count.  Hospitalist services were contacted for further evaluation.  Of note the patient refused a Covid swab.    Hospital course:  Diabetic foot ulcer Left fifth metatarsal bone osteomyelitis Left foot abscess Left foot cellulitis Patient underwent left partial fifth metatarsal base resection/I&D on 7/18 without clean margins; wound culture growing MSSA. Patient initially managed on Vancomycin IV and Zosyn IV which was transitioned to Unasyn. Wound vac placed after surgery and to be continued on discharge. No arterial disease requiring intervention seen on angiogram per vascular surgery. ID final recommendations for antibiotics include  dalbavancin q week x5 doses in addition to 2 weeks of Flagyl. Patient to follow-up with ID and podiatry. He is non-weight bearing of his left leg.  Leukocytosis Resolved with antibiotics  Diabetes mellitus, type 2 Hemoglobin A1C of 8.0%. Continue home regimen.  Hyperlipidemia Continue Crestor  Hypertension Continue amlodipine, hydrochlorothiazide  Discharge Diagnoses:  Active Problems:   Type 2 diabetes mellitus with hyperlipidemia ([place])   Diabetic foot ulcer [facility])    Discharge Instructions  Discharge Instructions    [facility] pharmacist to adjust dose for Vancomycin, Aminoglycosides and other anti-infective therapies as requested by physician.   Complete by: As directed    [facility] to provide Cath Flo 27m   Complete by: As directed    Administer for PICC line occlusion and as ordered by physician for other access device issues.   Anaphylaxis Kit: Provided to treat any anaphylactic reaction to the medication being provided to the patient if First Dose or when requested by physician   Complete by: As directed    Epinephrine 1141mml vial / amp: Administer 0.41m44m0.41ml71mubcutaneously once for moderate to severe anaphylaxis, nurse to call physician and pharmacy when reaction occurs and call 911 if needed for immediate care   Diphenhydramine 50mg441mIV vial: Administer 25-50mg 47mM PRN for first dose reaction, rash, itching, mild reaction, nurse to call physician and pharmacy when reaction occurs   Sodium Chloride 0.9% NS 500ml I57mdminister if needed for hypovolemic blood pressure drop or as ordered by physician after call to physician with anaphylactic reaction   Change dressing on IV access line weekly  and PRN   Complete by: As directed    Diet - low sodium heart healthy   Complete by: As directed    Discharge wound care:   Complete by: As directed    Apply Mepitel and then apply barrier ring around the edges and one piece black foam to 125 mm  continuous suction and change every Monday, Wednesday, Friday, then apply ace wrap and leg immobilizer.   Flush IV access with Sodium Chloride 0.9% and Heparin 10 units/ml or 100 units/ml   Complete by: As directed    Home infusion instructions - [facility]   Complete by: As directed    Instructions: Flush IV access with Sodium Chloride 0.9% and Heparin 10units/ml or 100units/ml   Change dressing on IV access line: Weekly and PRN   Instructions Cath Flo 68m: Administer for PICC Line occlusion and as ordered by physician for other access device   [facility] pharmacist to adjust dose for: Vancomycin, Aminoglycosides and other anti-infective therapies as requested by physician   Increase activity slowly   Complete by: As directed    Method of administration may be changed at the discretion of home infusion pharmacist based upon assessment of the patient and/or caregiver's ability to self-administer the medication ordered   Complete by: As directed      Allergies as of 07/01/2020   No Known Allergies     Medication List    TAKE these medications   amLODipine 10 MG tablet Commonly known as: NORVASC TAKE 1 TABLET(10 MG) BY MOUTH DAILY   aspirin 81 MG tablet Take by mouth.   Dalvance 500 MG Solr Generic drug: dalbavancin Inject 500-1,000 mg into the vein every 7 (seven) days for 5 doses. Dalbavancin 10061mIV x 1 then in one week, start 50048mV q 7 days x 4 doses Indication: Left foot osteomyelitis with MSSA, group B strep First Dose:No Last Day of Therapy: 07/30/2020 Labs - Once weekly:  CBC/D, BMP, ESR and CRP Method of administration may be changed at the discretion of home infusion pharmacist based upon assessment of the patient and/or caregiver's ability to self-administer the medication ordered. Start taking on: July 02, 2020   glipiZIDE 10 MG 24 hr tablet Commonly known as: GLUCOTROL XL Take 1 tablet (10 mg total) by mouth daily with breakfast.     Jardiance 25 MG Tabs tablet Generic drug: empagliflozin TAKE 1 TABLET BY MOUTH ONCE DAILY   metroNIDAZOLE 500 MG tablet Commonly known as: FLAGYL Take 1 tablet (500 mg total) by mouth every 8 (eight) hours for 14 days.   rosuvastatin 40 MG tablet Commonly known as: CRESTOR TAKE 1 TABLET BY MOUTH EVERY OTHER DAY. USUALLY TAKES MONDAY-FRIDAY   torsemide 10 MG tablet Commonly known as: DEMADEX Take by mouth.   valsartan-hydrochlorothiazide 320-25 MG tablet Commonly known as: DIOVAN-HCT TAKE 1 TABLET BY MOUTH EVERY DAY( IN PLACE OF LISINOPRIL/HCTZ)   Vitamin D (Ergocalciferol) 1.25 MG (50000 UNIT) Caps capsule Commonly known as: DRISDOL TAKE ONE CAPSULE BY MOUTH ONCE A WEEK FOR 12 WEEKS            Durable Medical Equipment  (From admission, onward)         Start     Ordered   06/29/20 1111  For home use only DME Walker rolling  Once       Question Answer Comment  Walker: With 5 Inch Wheels   Patient needs a walker to treat with the following condition Diabetic foot ulcer  with osteomyelitis ([place])      06/29/20 1111           Discharge Care Instructions  (From admission, onward)         Start     Ordered   07/01/20 0000  Change dressing on IV access line weekly and PRN  (Home infusion instructions - [facility] )        07/01/20 1618   07/01/20 0000  Discharge wound care:       Comments: Apply Mepitel and then apply barrier ring around the edges and one piece black foam to 125 mm continuous suction and change every Monday, Wednesday, Friday, then apply ace wrap and leg immobilizer.   07/01/20 1618          Follow-up Information    Caroline More, DPM. Schedule an appointment as soon as possible for a visit in 1 week(s).   Specialty: Health visitor information: [place] [phone]        [place]. Schedule an appointment as soon as possible for a visit.    Specialty: Wound Care Why: Wound vac changes and wound assessment Contact information: [address] [id number] ar [phone]       Tsosie Billing, MD Follow up in 3 week(s).   Specialty: Infectious Diseases Why: Osteomyelitis Contact information: [place]  [phone]              No Known Allergies  Consultations:  Podiatry  Infectious disease   Procedures/Studies: MR FOOT LEFT WO CONTRAST  Result Date: 06/25/2020 CLINICAL DATA:  Foot ulcer, question of osteomyelitis EXAM: MRI OF THE LEFT FOOT WITHOUT CONTRAST TECHNIQUE: Multiplanar, multisequence MR imaging of the left was performed. No intravenous contrast was administered. COMPARISON:  Radiograph same day FINDINGS: Bones/Joint/Cartilage There is a large area of ulceration seen overlying the lateral base of the fifth metatarsal with subcutaneous emphysema and a sinus tract to the underlying osseous surface. There is a focal loculated fluid collections seen along the lateral surface measuring approximately 2.2 x 0.9 x 3.0 cm. There is increased T2 marrow signal seen at the lateral base of the fifth metatarsal with subtly increased T1 hypointensity. There is also mildly increased T2 hyperintense signal seen within the cuboid, lateral cuneiform, lateral aspect of the anterior calcaneus without T1 hypointensity. No fracture or areas of cortical destruction however are noted. Ligaments Lisfranc ligaments are intact. Muscles and Tendons Diffusely increased feathery signal seen within the muscles surrounding the forefoot. The flexor and extensor tendons are intact. Soft tissues As described above large area of ulceration seen over the lateral aspect of the fifth metatarsal base with subcutaneous emphysema and a sinus tract. There is diffuse subcutaneous edema and skin thickening. A loculated fluid collection is also noted along the lateral base. IMPRESSION: 1. Large area of  ulceration over the lateral base the fifth metatarsal with a sinus tract, subcutaneous emphysema and a loculated abscess as described above. 2. Findings suggestive of early osteomyelitis involving the fifth metatarsal base. 3. Mild reactive marrow seen within the anterior calcaneus, cuboid, and lateral cuneiform. Electronically Signed   By: Prudencio Pair M.D.   On: 06/25/2020 23:05   PERIPHERAL VASCULAR CATHETERIZATION  Result Date: 06/30/2020 See op note  DG Foot Complete Left  Result Date: 06/27/2020 CLINICAL DATA:  Postop check. EXAM: LEFT FOOT - COMPLETE 3+ VIEW COMPARISON:  June 25, 2020 FINDINGS: There are postsurgical changes of the  lateral aspect of the foot. There is subcutaneous gas with soft tissue edema and new antibiotic beads. There is no acute displaced fracture or dislocation. There is a small plantar calcaneal spur. There appears to be interval resection of a portion of the base of the fifth metatarsal. Correlation with the patient's operative report is recommended. IMPRESSION: Postsurgical changes as above. Electronically Signed   By: Constance Holster M.D.   On: 06/27/2020 19:34   DG Foot Complete Left  Result Date: 06/25/2020 CLINICAL DATA:  Pain, diabetic foot ulcer EXAM: LEFT FOOT - COMPLETE 3+ VIEW COMPARISON:  None. FINDINGS: There is no evidence of fracture or dislocation. No areas of cortical destruction or periosteal reaction. There is a focal area of ulceration with subcutaneous emphysema and soft tissue swelling seen over the lateral base of the fifth metatarsal. IMPRESSION: Area of ulceration overlying the fifth metatarsal base. No definite evidence of osteomyelitis. Electronically Signed   By: Prudencio Pair M.D.   On: 06/25/2020 18:13       Subjective: No issues with his foot. Feels fine.  Discharge Exam: Vitals:   07/01/20 0408 07/01/20 1255  BP: (!) 146/74 (!) 136/82  Pulse: 61 66  Resp: 18 14  Temp: 98.1 F (36.7 C) (!) 97.5 F (36.4 C)  SpO2: 98% 100%     Vitals:   06/30/20 1515 06/30/20 1953 07/01/20 0408 07/01/20 1255  BP: (!) 165/81 126/72 (!) 146/74 (!) 136/82  Pulse: 81 69 61 66  Resp: _0 Temp: 98.4 F (36.9 C) 97.7 F (36.5 C) 98.1 F (36.7 C) (!) 97.5 F (36.4 C)  TempSrc: Oral Oral Oral Oral  SpO2: 96% 99% 98% 100%  Weight:      Height:        General: Pt is alert, awake, not in acute distress Cardiovascular: RRR, S1/S2 +, no rubs, no gallops Respiratory: CTA bilaterally, no wheezing, no rhonchi Abdominal: Soft, NT, ND, bowel sounds + Extremities: no edema, no cyanosis. Left leg in boot and dressing    The results of significant diagnostics from this hospitalization (including imaging, microbiology, ancillary and laboratory) are listed below for reference.     Microbiology: Recent Results (from the past 240 hour(s))  Aerobic/Anaerobic Culture (surgical/deep wound)     Status: None   Collection Time: 06/25/20  7:08 PM   Specimen: Foot; Tissue  Result Value Ref Range Status   Specimen Description   Final    FOOT Performed at [facility], [address] [place], [place] [address]    Special Requests   Final    Normal Performed at [facility], [address] [place], [place] [address]    Gram Stain   Final    RARE WBC PRESENT, PREDOMINANTLY PMN ABUNDANT GRAM NEGATIVE RODS ABUNDANT GRAM POSITIVE COCCI IN CHAINS IN CLUSTERS    Culture   Final    ABUNDANT STAPHYLOCOCCUS AUREUS WITHIN MIXED ORGANISMS ABUNDANT PREVOTELLA BIVIA BETA LACTAMASE POSITIVE Performed at [facility], [place] [address] [place], [place] [address]    Report Status 06/30/2020 FINAL  Final   Organism ID, Bacteria STAPHYLOCOCCUS AUREUS  Final      Susceptibility   Staphylococcus aureus - MIC*    CIPROFLOXACIN <=0.5 SENSITIVE Sensitive     ERYTHROMYCIN <=0.25 SENSITIVE Sensitive     GENTAMICIN <=0.5 SENSITIVE Sensitive     OXACILLIN <=0.25 SENSITIVE Sensitive     TETRACYCLINE <=1 SENSITIVE  Sensitive     VANCOMYCIN <=0.5 SENSITIVE Sensitive  TRIMETH/SULFA <=10 SENSITIVE Sensitive     CLINDAMYCIN <=0.25 SENSITIVE Sensitive     RIFAMPIN <=0.5 SENSITIVE Sensitive     Inducible Clindamycin NEGATIVE Sensitive     * ABUNDANT STAPHYLOCOCCUS AUREUS  Aerobic/Anaerobic Culture (surgical/deep wound)     Status: None (Preliminary result)   Collection Time: 06/27/20  4:42 PM   Specimen: Wound; Abscess  Result Value Ref Range Status   Specimen Description   Final    WOUND Performed at [facility], [address] [place], [place] [address]    Special Requests   Final    NONE Performed at [facility], [address] [place], [place] [address]    Gram Stain   Final    NO WBC SEEN NO ORGANISMS SEEN Performed at [facility], [place] [address], [place], [place] [address]    Culture   Final    RARE STREPTOCOCCUS MITIS/ORALIS RARE GROUP B STREP(S.AGALACTIAE)ISOLATED TESTING AGAINST S. AGALACTIAE NOT ROUTINELY PERFORMED DUE TO PREDICTABILITY OF AMP/PEN/VAN SUSCEPTIBILITY. NO ANAEROBES ISOLATED; CULTURE IN PROGRESS FOR 5 DAYS    Report Status PENDING  Incomplete   Organism ID, Bacteria STREPTOCOCCUS MITIS/ORALIS  Final      Susceptibility   Streptococcus mitis/oralis - MIC*    TETRACYCLINE <=0.25 SENSITIVE Sensitive     VANCOMYCIN 0.5 SENSITIVE Sensitive     CLINDAMYCIN <=0.25 SENSITIVE Sensitive     PENICILLIN Value in next row Sensitive      SENSITIVE0.06    CEFTRIAXONE Value in next row Sensitive      SENSITIVE0.12    * RARE STREPTOCOCCUS MITIS/ORALIS     Labs: BNP (last 3 results) No results for input(s): BNP in the last 8760 hours. Basic Metabolic Panel: Recent Labs  Lab 06/25/20 1736 06/25/20 1736 06/26/20 0458 06/27/20 0635 06/28/20 0450 06/29/20 0415 06/30/20 0424  NA 134*  --  136 137  --  138  --   K 3.5  --  3.3* 3.9  --  3.9  --   CL 98  --  100 104  --  103  --   CO2 20*  --  19* 23  --  28  --   GLUCOSE 187*  --  103*  207*  --  128*  --   BUN 37*  --  29* 32*  --  29*  --   CREATININE 1.37*   < > 1.15 1.26* 1.29* 1.11  1.19 0.95  CALCIUM 9.2  --  9.0 9.1  --  9.1  --    < > = values in this interval not displayed.   Liver Function Tests: Recent Labs  Lab 06/25/20 1736  AST 14*  ALT 16  ALKPHOS 62  BILITOT 2.5*  PROT 8.0  ALBUMIN 3.6   No results for input(s): LIPASE, AMYLASE in the last 168 hours. No results for input(s): AMMONIA in the last 168 hours. CBC: Recent Labs  Lab 06/25/20 1736 06/26/20 0458 06/27/20 0635 06/29/20 0415  WBC 13.4* 12.8* 8.7 6.8  NEUTROABS 11.0*  --   --  4.4  HGB 12.4* 11.6* 11.7* 11.4*  HCT 35.4* 34.9* 34.4* 35.9*  MCV 89.6 93.6 92.7 96.2  PLT 250 246 281 360   Cardiac Enzymes: No results for input(s): CKTOTAL, CKMB, CKMBINDEX, TROPONINI in the last 168 hours. BNP: Invalid input(s): POCBNP CBG: Recent Labs  Lab 06/30/20 0830 06/30/20 1133 06/30/20 2113 07/01/20 0828 07/01/20 1253  GLUCAP 149* 121* 185* 146* 183*   D-Dimer No results for input(s):  DDIMER in the last 72 hours. Hgb A1c No results for input(s): HGBA1C in the last 72 hours. Lipid Profile No results for input(s): CHOL, HDL, LDLCALC, TRIG, CHOLHDL, LDLDIRECT in the last 72 hours. Thyroid function studies No results for input(s): TSH, T4TOTAL, T3FREE, THYROIDAB in the last 72 hours.  Invalid input(s): FREET3 Anemia work up No results for input(s): VITAMINB12, FOLATE, FERRITIN, TIBC, IRON, RETICCTPCT in the last 72 hours. Urinalysis No results found for: COLORURINE, APPEARANCEUR, [place], [place], [place], [place], [place], [place], PROTEINUR, UROBILINOGEN, NITRITE, LEUKOCYTESUR Sepsis Labs Invalid input(s): PROCALCITONIN,  WBC,  LACTICIDVEN Microbiology Recent Results (from the past 240 hour(s))  Aerobic/Anaerobic Culture (surgical/deep wound)     Status: None   Collection Time: 06/25/20  7:08 PM   Specimen: Foot; Tissue  Result Value Ref Range Status   Specimen  Description   Final    FOOT Performed at [facility], [address] [place], [place] [address]    Special Requests   Final    Normal Performed at [facility], [address] [place], [place] [address]    Gram Stain   Final    RARE WBC PRESENT, PREDOMINANTLY PMN ABUNDANT GRAM NEGATIVE RODS ABUNDANT GRAM POSITIVE COCCI IN CHAINS IN CLUSTERS    Culture   Final    ABUNDANT STAPHYLOCOCCUS AUREUS WITHIN MIXED ORGANISMS ABUNDANT PREVOTELLA BIVIA BETA LACTAMASE POSITIVE Performed at [facility], [place] [address] [place], [place] [address]    Report Status 06/30/2020 FINAL  Final   Organism ID, Bacteria STAPHYLOCOCCUS AUREUS  Final      Susceptibility   Staphylococcus aureus - MIC*    CIPROFLOXACIN <=0.5 SENSITIVE Sensitive     ERYTHROMYCIN <=0.25 SENSITIVE Sensitive     GENTAMICIN <=0.5 SENSITIVE Sensitive     OXACILLIN <=0.25 SENSITIVE Sensitive     TETRACYCLINE <=1 SENSITIVE Sensitive     VANCOMYCIN <=0.5 SENSITIVE Sensitive     TRIMETH/SULFA <=10 SENSITIVE Sensitive     CLINDAMYCIN <=0.25 SENSITIVE Sensitive     RIFAMPIN <=0.5 SENSITIVE Sensitive     Inducible Clindamycin NEGATIVE Sensitive     * ABUNDANT STAPHYLOCOCCUS AUREUS  Aerobic/Anaerobic Culture (surgical/deep wound)     Status: None (Preliminary result)   Collection Time: 06/27/20  4:42 PM   Specimen: Wound; Abscess  Result Value Ref Range Status   Specimen Description   Final    WOUND Performed at [facility], [address], [place], [place] [address]    Special Requests   Final    NONE Performed at [facility], [address] [place], [place] [address]    Gram Stain   Final    NO WBC SEEN NO ORGANISMS SEEN Performed at [facility], [place] [address] [place], [place] [address]    Culture   Final    RARE STREPTOCOCCUS MITIS/ORALIS RARE GROUP B STREP(S.AGALACTIAE)ISOLATED TESTING AGAINST S. AGALACTIAE NOT ROUTINELY PERFORMED DUE TO PREDICTABILITY OF  AMP/PEN/VAN SUSCEPTIBILITY. NO ANAEROBES ISOLATED; CULTURE IN PROGRESS FOR 5 DAYS    Report Status PENDING  Incomplete   Organism ID, Bacteria STREPTOCOCCUS MITIS/ORALIS  Final      Susceptibility   Streptococcus mitis/oralis - MIC*    TETRACYCLINE <=0.25 SENSITIVE Sensitive     VANCOMYCIN 0.5 SENSITIVE Sensitive     CLINDAMYCIN <=0.25 SENSITIVE Sensitive     PENICILLIN Value in next row Sensitive      SENSITIVE0.06    CEFTRIAXONE Value in next row Sensitive      SENSITIVE0.12    *  RARE STREPTOCOCCUS MITIS/ORALIS     Time coordinating discharge: 35 minutes  SIGNED:   Cordelia Poche, MD [facility] 07/01/2020, 4:20 PM

## 2020-07-02 LAB — AEROBIC/ANAEROBIC CULTURE W GRAM STAIN (SURGICAL/DEEP WOUND): Gram Stain: NONE SEEN

## 2020-07-05 ENCOUNTER — Telehealth: Payer: Self-pay

## 2020-07-05 NOTE — Telephone Encounter (Signed)
Patient called stating no one has come to change dressings. Advised we handle only Abx. Instructed on VM for him to reach out to podiatry([facility]). Also has [place] nurse coming to help.

## 2020-07-05 NOTE — TOC Progression Note (Addendum)
Transition of Care [facility]) - Progression Note    Patient Details  Name: [name] MRN: [id number] Date of Birth: 04-Aug-1966  Transition of Care [facility]) CM/SW [place], [place] Phone Number: [phone] 07/05/2020, 3:06 PM  Clinical Narrative:      07/05/2020 - CSW spoke with Maudie Mercury at [facility] and she was able to schedule an appointment for the patient for Wound Vac on 07/07/2020 - at 9:00AM.  This CSW spoke with Mr. And Mrs. Eslick to update them on [place] appointment.  Ms, Zepeda inquired about the reasons why home health was not set up.  Unfortunatley this CSW was unable to tell her exactly what occurred.  However, I did explain to Ms. Gacek that we have no control over which home health agencies will accept a patient, nor what occurs with those home health agencies once the patient has discharged from the hospital.  Ms. Derusha verbalized understanding but she was adamant that there needed to be some accountability as to why the patient was not receiving home health services after d/c.  Ms. Stoutenburg stated Christella Scheuermann was looking for a home health agency in the network.  I recommended they keep their appointment with [facility] even if their insurance company was able to find someone =, just incase they were not available to see them on Wednesday. Ms. Campton verbalized understanding.  This CSW also recommended the patient contact his PCP for a follow up visit and request assistance with uptainning home health services.  Mr. Hands verbalized understanding.  Expected Discharge Plan: Home/Self Care Barriers to Discharge: Barriers Resolved  Expected Discharge Plan and Services Expected Discharge Plan: Home/Self Care   Discharge Planning Services: CM Consult   Living arrangements for the past 2 months: Single Family Home Expected Discharge Date: 07/01/20               DME Arranged: Negative pressure wound device, Walker rolling DME Agency: [employer] Date DME  Agency Contacted: 07/01/20 Time DME Agency Contacted: [id number] Representative spoke with at DME Agency: [place]: RN       Representative spoke with at [place]: Myna Bright with [facility] Determinants of Health (SDOH) Interventions    Readmission Risk Interventions No flowsheet data found.

## 2020-07-05 NOTE — TOC Progression Note (Addendum)
Transition of Care [facility]) - Progression Note    Patient Details  Name: [name] MRN: [id number] Date of Birth: 04-07-1966  Transition of Care [facility]) CM/SW [place], [place] Phone Number: [phone] 07/05/2020, 11:29 AM  Clinical Narrative:     07/05/2020 - CSW received call from [facility] at [place] for update on patient care after discharge.  Patient d/c on 7/22 with "weekly infusion at home for antibiotic therapy through [facility], [place] will provide nursing for the infusion.  Patient will also need home health for wound vac changes 3 times per week." However patient's insurance was not accepted by any of the local home health providers and therefore the patient was d/c without a home health agency to assist.  RM/CM at the time of patient's d/c reached out to [facility] for assistance in finding home health services.  Currently the patient is not receiving home health services and is unable to schedule an appointment for the [place] [phone] until August 2021.  The patient is still in need of wound vac changes three times a week.  This CSW contacted [place] and spoke with Freda Munro, she stated she would relay my message to her supervisor and her supervisor would call me back.  Freda Munro did confirm they are currently scheduling out to August.  of a Expected Discharge Plan: Home/Self Care Barriers to Discharge: Barriers Resolved  Expected Discharge Plan and Services Expected Discharge Plan: Home/Self Care   Discharge Planning Services: CM Consult   Living arrangements for the past 2 months: Single Family Home Expected Discharge Date: 07/01/20               DME Arranged: Negative pressure wound device, Walker rolling DME Agency: [employer] Date DME Agency Contacted: 07/01/20 Time DME Agency Contacted: [id number] Representative spoke with at DME Agency: [place]: RN       Representative spoke with at [place]:  Myna Bright with [employer]   Social Determinants of Health (SDOH) Interventions    Readmission Risk Interventions No flowsheet data found.

## 2020-07-07 ENCOUNTER — Encounter: Payer: Managed Care, Other (non HMO) | Attending: Internal Medicine | Admitting: Internal Medicine

## 2020-07-07 ENCOUNTER — Other Ambulatory Visit: Payer: Self-pay

## 2020-07-07 DIAGNOSIS — N400 Enlarged prostate without lower urinary tract symptoms: Secondary | ICD-10-CM | POA: Insufficient documentation

## 2020-07-07 DIAGNOSIS — E11621 Type 2 diabetes mellitus with foot ulcer: Secondary | ICD-10-CM | POA: Insufficient documentation

## 2020-07-07 DIAGNOSIS — Z8249 Family history of ischemic heart disease and other diseases of the circulatory system: Secondary | ICD-10-CM | POA: Insufficient documentation

## 2020-07-07 DIAGNOSIS — I10 Essential (primary) hypertension: Secondary | ICD-10-CM | POA: Insufficient documentation

## 2020-07-07 DIAGNOSIS — L97528 Non-pressure chronic ulcer of other part of left foot with other specified severity: Secondary | ICD-10-CM | POA: Insufficient documentation

## 2020-07-07 DIAGNOSIS — T8130XA Disruption of wound, unspecified, initial encounter: Secondary | ICD-10-CM | POA: Diagnosis not present

## 2020-07-07 DIAGNOSIS — E785 Hyperlipidemia, unspecified: Secondary | ICD-10-CM | POA: Diagnosis not present

## 2020-07-07 DIAGNOSIS — X58XXXA Exposure to other specified factors, initial encounter: Secondary | ICD-10-CM | POA: Diagnosis not present

## 2020-07-07 DIAGNOSIS — E1151 Type 2 diabetes mellitus with diabetic peripheral angiopathy without gangrene: Secondary | ICD-10-CM | POA: Insufficient documentation

## 2020-07-07 NOTE — Progress Notes (Signed)
ARTY, LANTZY ([id number]) Visit Report for 07/07/2020 Chief Complaint Document Details Patient Name: [name], [name] Date of Service: 07/07/2020 9:15 AM Medical Record Number: [id number] Patient Account Number: [id number] Date of Birth/Sex: 1966-08-22 (54 y.o. Male) Treating RN: Cornell Barman Primary Care Provider: [name] Other Clinician: Referring Provider: Referral, Self Treating Provider/Extender: [name] Weeks in Treatment: 0 Information Obtained from: Patient Chief Complaint 07/07/2020; patient is here for review of the surgical wound on the lateral aspect of his left foot Electronic Signature(s) Signed: 07/07/2020 3:56:44 PM By: Linton Ham MD Entered By: Linton Ham on 07/07/2020 09:49:53 [name] ([id number]) -------------------------------------------------------------------------------- HPI Details Patient Name: [name] Date of Service: 07/07/2020 9:15 AM Medical Record Number: [id number] Patient Account Number: [id number] Date of Birth/Sex: [birth year] (54 y.o. Male) Treating RN: Cornell Barman Primary Care Provider: [name] Other Clinician: Referring Provider: Referral, Self Treating Provider/Extender: [name] Weeks in Treatment: 0 History of Present Illness HPI Description: ADMISSION 07/07/2020 This is a 54 year old man with type 2 diabetes. He was admitted to hospital from 7/16 through 7/22 having apparently a ulcer on his foot for a prolonged period of time. He was discovered to have an abscess as well as osteomyelitis. This was documented by MRI [see below] he underwent 1/5 metatarsal base resection by Dr. Luana Shu of podiatry. Intraoperative culture showed MSSA. I believe he and another operative debridement at which time cultures showed Streptococcus mitis and group B strep. In the hospital the patient was treated with Vanco and Zosyn and subsequently Unasyn but he was discharged on Dalavance weekly x5 doses of which he  has had 1 dose. He was also reviewed by Dr. Lucky Cowboy while he was in hospital. He had an angiogram that is shown below. 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 The patient was discharged with a wound VAC over the surgical site however no arrangements were made for the wound VAC change. He does not have home health. In epic it shows communication between the infectious disease office and podiatry asking about who would be changing the [facility] and largely he is here for review of the wound and VAC changes. oAngiogram 7/21 ooooooooooooAortogram:o The renal arteries were widely patent. The aorta and iliac arteries were widely patent without significant stenosis ooooooooooooLeft lower Extremity:o Normal common femoral artery, profunda femoris artery, and superficial femoral and popliteal arteries. The tibials started with a very large vessel going straight down that appeared to be the peroneal proximally and what appeared to be the anterior tibial coming off lateral to this. Following these down to the foot, the large vessel actually coursed medially and the typical course of the posterior tibial artery and was patent into the midfoot where there was small vessel disease at that point. What appeared to be the anterior tibial artery at the takeoff actually coursed as the peroneal artery with a typical termination at the ankle which was feeding some small collaterals into the foot. No true anterior tibial artery was seen. This appeared to be a congenital configuration but no vascular disease requiring treatment was present. o oMRI 7/16 IMPRESSION: 1. Large area of ulceration over the lateral base the fifth metatarsal with a sinus tract, subcutaneous emphysema and a loculated abscess as described above. 2. Findings suggestive of early osteomyelitis involving the fifth metatarsal base. 3. Mild  reactive marrow seen within the anterior calcaneus, cuboid, and lateral  cuneiform. Past medical history includes type 2 diabetes, hypertension, hyperlipidemia, BPH Electronic Signature(s) Signed: 07/07/2020 3:56:44 PM By: Linton Ham MD Entered By: Linton Ham on 07/07/2020 09:55:05 [name] ([id number]) -------------------------------------------------------------------------------- Physical Exam Details Patient Name: [name] Date of Service: 07/07/2020 9:15 AM Medical Record Number: [id number] Patient Account Number: [id number] Date of Birth/Sex: 1966/05/23 (54 y.o. Male) Treating RN: Cornell Barman Primary Care Provider: [name] Other Clinician: Referring Provider: Referral, Self Treating Provider/Extender: [name] Weeks in Treatment: 0 Constitutional Sitting or standing Blood Pressure is within target range for patient.. Pulse regular and within target range for patient.Marland Kitchen Respirations regular, non- labored and within target range.. Temperature is normal and within the target range for the patient.Marland Kitchen appears in no distress. Respiratory Respiratory effort is easy and symmetric bilaterally. Rate is normal at rest and on room air.. Cardiovascular Popliteal pulses on the left are palpable.. Pedal pulses palpable and strong bilaterally.. Integumentary (Hair, Skin) No erythema around the wound. Neurological Sensation seems intact to the microfilament.Marland Kitchen Psychiatric No evidence of depression, anxiety, or agitation. Calm, cooperative, and communicative. Appropriate interactions and affect.. Notes Wound exam; the area in question is on the lateral aspect of the left foot. Longitudinally orientated surgical wound with a smaller area extending towards the plantar foot. At the most distal part of the wound and also the most proximal the edges are well approximated. In the middle two thirds of the wound and the small area extending towards the plantar foot there  is been wound dehiscence. Some debris in the center of the wound. Sutures are still in place however there is considerable depth and some spots that appear to probe to bone. There is no surrounding erythema no overt purulence Electronic Signature(s) Signed: 07/07/2020 3:56:44 PM By: Linton Ham MD Entered By: Linton Ham on 07/07/2020 09:57:44 [name] ([id number]) -------------------------------------------------------------------------------- Physician Orders Details Patient Name: [name] Date of Service: 07/07/2020 9:15 AM Medical Record Number: [id number] Patient Account Number: [id number] Date of Birth/Sex: August 12, 1966 (54 y.o. Male) Treating RN: Cornell Barman Primary Care Provider: [name] Other Clinician: Referring Provider: Referral, Self Treating Provider/Extender: [name] in Treatment: 0 Verbal / Phone Orders: No Diagnosis Coding ICD-10 Coding Code Description E11.621 Type 2 diabetes mellitus with foot ulcer T81.31XD Disruption of external operation (surgical) wound, not elsewhere classified, subsequent encounter L97.528 Non-pressure chronic ulcer of other part of left foot with other specified severity Dressing Change Frequency Wound #1 Left,Lateral Foot o Other: - Monday and Thursday Follow-up Appointments Wound #1 Left,Lateral Foot o Nurse Visit as needed Negative Pressure Wound Therapy Wound #1 Left,Lateral Foot o Wound VAC settings at 125/130 mmHg continuous pressure. Use BLACK/GREEN foam to wound cavity. Use WHITE foam to fill any tunnel/s and/or undermining. Change VAC dressing 2 X WEEK. Change canister as indicated when full. Nurse may titrate settings and frequency of dressing changes as clinically indicated. o Number of foam/gauze pieces used in the dressing = - 2 Electronic Signature(s) Signed: 07/07/2020 2:05:12 PM By: Gretta Cool, BSN, RN, CWS, Kim RN, BSN Signed: 07/07/2020 3:56:44 PM By: Linton Ham MD Entered  By: Gretta Cool, BSN, RN, CWS, Kim on 07/07/2020 [place] ([id number]) -------------------------------------------------------------------------------- Problem List Details Patient Name: [name] Date of Service: 07/07/2020 9:15 AM Medical Record Number: [id number] Patient Account Number: [id number] Date of Birth/Sex: 1966-07-25 (54 y.o. Male) Treating RN: Cornell Barman Primary Care Provider: [name] Other Clinician: Referring Provider: Referral, Self Treating Provider/Extender: [name] Weeks in Treatment: 0 Active  Problems ICD-10 Encounter Code Description Active Date MDM Diagnosis E11.621 Type 2 diabetes mellitus with foot ulcer 07/07/2020 No Yes T81.31XD Disruption of external operation (surgical) wound, not elsewhere 07/07/2020 No Yes classified, subsequent encounter L97.528 Non-pressure chronic ulcer of other part of left foot with other specified 07/07/2020 No Yes severity Inactive Problems Resolved Problems Electronic Signature(s) Signed: 07/07/2020 3:56:44 PM By: Linton Ham MD Entered By: Linton Ham on 07/07/2020 09:42:57 Ricciardelli, Barbara Cower ([id number]) -------------------------------------------------------------------------------- Progress Note Details Patient Name: [name] Date of Service: 07/07/2020 9:15 AM Medical Record Number: [id number] Patient Account Number: [id number] Date of Birth/Sex: [birth year] (54 y.o. Male) Treating RN: Cornell Barman Primary Care Provider: [name] Other Clinician: Referring Provider: Referral, Self Treating Provider/Extender: [name] Weeks in Treatment: 0 Subjective Chief Complaint Information obtained from Patient 07/07/2020; patient is here for review of the surgical wound on the lateral aspect of his left foot History of Present Illness (HPI) ADMISSION 07/07/2020 This is a 54 year old man with type 2 diabetes. He was admitted to hospital from 7/16 through 7/22 having apparently a  ulcer on his foot for a prolonged period of time. He was discovered to have an abscess as well as osteomyelitis. This was documented by MRI [see below] he underwent 1/5 metatarsal base resection by Dr. Luana Shu of podiatry. Intraoperative culture showed MSSA. I believe he and another operative debridement at which time cultures showed Streptococcus mitis and group B strep. In the hospital the patient was treated with Vanco and Zosyn and subsequently Unasyn but he was discharged on Dalavance weekly x5 doses of which he has had 1 dose. He was also reviewed by Dr. Lucky Cowboy while he was in hospital. He had an angiogram that is shown below. 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 The patient was discharged with a wound VAC over the surgical site however no arrangements were made for the wound VAC change. He does not have home health. In epic it shows communication between the infectious disease office and podiatry asking about who would be changing the [facility] and largely he is here for review of the wound and VAC changes. Angiogram 7/21 Aortogram: The renal arteries were widely patent. The aorta and iliac arteries were widely patent without significant stenosis Left lower Extremity: Normal common femoral artery, profunda femoris artery, and superficial femoral and popliteal arteries. The tibials started with a very large vessel going straight down that appeared to be the peroneal proximally and what appeared to be the anterior tibial coming off lateral to this. Following these down to the foot, the large vessel actually coursed medially and the typical course of the posterior tibial artery and was patent into the midfoot where there was small vessel disease at that point. What appeared to be the anterior tibial artery at the takeoff actually coursed as the peroneal artery with a typical termination at the  ankle which was feeding some small collaterals into the foot. No true anterior tibial artery was seen. This appeared to be a congenital configuration but no vascular disease requiring treatment was present. MRI 7/16 IMPRESSION: 1. Large area of ulceration over the lateral base the fifth metatarsal with a sinus tract, subcutaneous emphysema and a loculated abscess as described above. 2. Findings suggestive of early osteomyelitis involving the fifth metatarsal base. 3. Mild reactive marrow seen within the anterior calcaneus, cuboid, and lateral cuneiform. Past medical history includes type  2 diabetes, hypertension, hyperlipidemia, BPH Patient History Information obtained from Patient. Allergies No Known Drug Allergies Family History Heart Disease - Father, Hypertension - Father, No family history of Cancer, Diabetes. Social History Never smoker, Marital Status - Married, Alcohol Use - Moderate, Drug Use - No History, Caffeine Use - Never. Medical History Cardiovascular Patient has history of Hypertension Endocrine Patient has history of Type II Diabetes Patient is treated with Oral Agents. Blood sugar is tested. PARSA, RICKETT ([id number]) Hospitalization/Surgery History - [facility]- Left Foot. Medical And Surgical History Notes Genitourinary High protein Review of Systems (ROS) Eyes Denies complaints or symptoms of Dry Eyes, Vision Changes, Glasses / Contacts. Ear/Nose/Mouth/Throat Denies complaints or symptoms of Difficult clearing ears, Sinusitis. Hematologic/Lymphatic Denies complaints or symptoms of Bleeding / Clotting Disorders, Human Immunodeficiency Virus. Respiratory Denies complaints or symptoms of Chronic or frequent coughs, Shortness of Breath. Cardiovascular Denies complaints or symptoms of Chest pain, LE edema. Gastrointestinal Denies complaints or symptoms of Frequent diarrhea, Nausea, Vomiting. Endocrine Denies complaints or symptoms of Hepatitis, Thyroid  disease, Polydypsia (Excessive Thirst). Genitourinary Denies complaints or symptoms of Kidney failure/ Dialysis, Incontinence/dribbling. Immunological Denies complaints or symptoms of Hives, Itching. Integumentary (Skin) Complains or has symptoms of Wounds. Denies complaints or symptoms of Bleeding or bruising tendency. Musculoskeletal Denies complaints or symptoms of Muscle Pain, Muscle Weakness. Neurologic Denies complaints or symptoms of Numbness/parasthesias, Focal/Weakness. Psychiatric Denies complaints or symptoms of Anxiety, Claustrophobia. Objective Constitutional Sitting or standing Blood Pressure is within target range for patient.. Pulse regular and within target range for patient.Marland Kitchen Respirations regular, non- labored and within target range.. Temperature is normal and within the target range for the patient.Marland Kitchen appears in no distress. Vitals Time Taken: 8:50 AM, Temperature: 97.8 F, Pulse: 78 bpm, Respiratory Rate: 18 breaths/min, Blood Pressure: 118/75 mmHg. Respiratory Respiratory effort is easy and symmetric bilaterally. Rate is normal at rest and on room air.. Cardiovascular Popliteal pulses on the left are palpable.. Pedal pulses palpable and strong bilaterally.. Neurological Sensation seems intact to the microfilament.Marland Kitchen Psychiatric No evidence of depression, anxiety, or agitation. Calm, cooperative, and communicative. Appropriate interactions and affect.. General Notes: Wound exam; the area in question is on the lateral aspect of the left foot. Longitudinally orientated surgical wound with a smaller area extending towards the plantar foot. At the most distal part of the wound and also the most proximal the edges are well approximated. In the middle two thirds of the wound and the small area extending towards the plantar foot there is been wound dehiscence. Some debris in the center of the wound. Sutures are still in place however there is considerable depth and some  spots that appear to probe to bone. There is no surrounding erythema no overt purulence Integumentary (Hair, Skin) No erythema around the wound. Wound #1 status is Open. Original cause of wound was Surgical Injury. The wound is located on the Left,Lateral Foot. The wound measures 7.8cm length x 3cm width x 0.8cm depth; 18.378cm^2 area and 14.703cm^3 volume. There is Fat Layer (Subcutaneous Tissue) Exposed exposed. There is a large amount of serosanguineous drainage noted. The wound margin is flat and intact. There is small (1-33%) red granulation within [place], [place] ([id number]) the wound bed. There is a small (1-33%) amount of necrotic tissue within the wound bed including Adherent Slough. Assessment Active Problems ICD-10 Type 2 diabetes mellitus with foot ulcer Disruption of external operation (surgical) wound, not elsewhere classified, subsequent encounter Non-pressure chronic ulcer of other part of left foot with other specified severity Plan #  1 I agree with the wound VAC for now and this can be changed in our clinic twice a week. Once for a nurse visit and once when seeing the clinician 2. The sutures are in place I am leaving this to Dr. Luana Shu 3. I suspect this wound will remain open in some spots although we will have to see if the wound VAC is successful in bringing in healthy granulation 4. The patient is on IV Dalvance and has received 1 dose of this. Total of 5. By the look of things he had underlying acute osteomyelitis at this point I am uncertain whether all of the involved bone was removed surgically or not. At some point it might be conceivable that he would be a candidate for HBO but that is not now. 5. The patient sees a Psychologist, sport and exercise Dr. Luana Shu tomorrow 6. In spite of the interesting angiogram his blood flow to the foot seems adequate. Peripheral pulses are robust his foot is warm I spent 35 minutes in direct review of this patient's records face-to-face evaluation and  preparation of this note Electronic Signature(s) Signed: 07/07/2020 3:56:44 PM By: Linton Ham MD Entered By: Linton Ham on 07/07/2020 10:00:28 [name] ([id number]) -------------------------------------------------------------------------------- ROS/PFSH Details Patient Name: [name] Date of Service: 07/07/2020 9:15 AM Medical Record Number: [id number] Patient Account Number: [id number] Date of Birth/Sex: [birth year] (54 y.o. Male) Treating RN: Cornell Barman Primary Care Provider: [name] Other Clinician: Referring Provider: Referral, Self Treating Provider/Extender: [name] Weeks in Treatment: 0 Information Obtained From Patient Eyes Complaints and Symptoms: Negative for: Dry Eyes; Vision Changes; Glasses / Contacts Ear/Nose/Mouth/Throat Complaints and Symptoms: Negative for: Difficult clearing ears; Sinusitis Hematologic/Lymphatic Complaints and Symptoms: Negative for: Bleeding / Clotting Disorders; Human Immunodeficiency Virus Respiratory Complaints and Symptoms: Negative for: Chronic or frequent coughs; Shortness of Breath Cardiovascular Complaints and Symptoms: Negative for: Chest pain; LE edema Medical History: Positive for: Hypertension Gastrointestinal Complaints and Symptoms: Negative for: Frequent diarrhea; Nausea; Vomiting Endocrine Complaints and Symptoms: Negative for: Hepatitis; Thyroid disease; Polydypsia (Excessive Thirst) Medical History: Positive for: Type II Diabetes Time with diabetes: 20 years Treated with: Oral agents Blood sugar tested every day: Yes Tested : Genitourinary Complaints and Symptoms: Negative for: Kidney failure/ Dialysis; Incontinence/dribbling Medical History: Past Medical History Notes: High protein Immunological Savoia, Mehar L. ([id number]) Complaints and Symptoms: Negative for: Hives; Itching Integumentary (Skin) Complaints and Symptoms: Positive for: Wounds Negative for: Bleeding or  bruising tendency Musculoskeletal Complaints and Symptoms: Negative for: Muscle Pain; Muscle Weakness Neurologic Complaints and Symptoms: Negative for: Numbness/parasthesias; Focal/Weakness Psychiatric Complaints and Symptoms: Negative for: Anxiety; Claustrophobia Oncologic Immunizations Pneumococcal Vaccine: Received Pneumococcal Vaccination: No Implantable Devices None Hospitalization / Surgery History Type of Hospitalization/Surgery [facility]- Left Foot Family and Social History Cancer: No; Diabetes: No; Heart Disease: Yes - Father; Hypertension: Yes - Father; Never smoker; Marital Status - Married; Alcohol Use: Moderate; Drug Use: No History; Caffeine Use: Never Electronic Signature(s) Signed: 07/07/2020 3:56:44 PM By: Linton Ham MD Signed: 07/07/2020 4:56:00 PM By: Gretta Cool, BSN, RN, CWS, Kim RN, BSN Entered By: Gretta Cool, BSN, RN, CWS, Kim on 07/07/2020 09:28:11 HEINZ, ECKERT ([id number]) -------------------------------------------------------------------------------- [place] Details Patient Name: [name] Date of Service: 07/07/2020 Medical Record Number: [id number] Patient Account Number: [id number] Date of Birth/Sex: Feb 05, 1966 (54 y.o. Male) Treating RN: Cornell Barman Primary Care Provider: [name] Other Clinician: Referring Provider: Referral, Self Treating Provider/Extender: [name] Weeks in Treatment: 0 Diagnosis Coding ICD-10 Codes Code Description E11.621 Type 2 diabetes mellitus with foot  ulcer T81.31XD Disruption of external operation (surgical) wound, not elsewhere classified, subsequent encounter L97.528 Non-pressure chronic ulcer of other part of left foot with other specified severity Facility Procedures CPT4 Code: 49201007 Description: 12197 - WOUND VAC-50 SQ CM OR LESS Modifier: Quantity: 1 Physician Procedures CPT4 Code Description: [id number] WC PHYS LEVEL 3 o NEW PT Modifier: Quantity: 1 CPT4 Code Description: ICD-10 Diagnosis  Description E11.621 Type 2 diabetes mellitus with foot ulcer T81.31XD Disruption of external operation (surgical) wound, not elsewhere class L97.528 Non-pressure chronic ulcer of other part of left foot with other  speci Modifier: ified, subsequent enco fied severity Quantity: Personal assistant) Signed: 07/07/2020 3:56:44 PM By: Linton Ham MD Entered By: Linton Ham on 07/07/2020 10:00:58

## 2020-07-07 NOTE — Progress Notes (Signed)
SAVA, PROBY ([id number]) Visit Report for 07/07/2020 Abuse/Suicide Risk Screen Details Patient Name: [name], [name] Date of Service: 07/07/2020 9:15 AM Medical Record Number: [id number] Patient Account Number: [id number] Date of Birth/Sex: 1966/07/25 (54 y.o. Male) Treating RN: Cornell Barman Primary Care Quinette Hentges: [name] Other Clinician: Referring Jhanae Jaskowiak: Referral, Self Treating Shyler Hamill/Extender: Ricard Dillon Weeks in Treatment: 0 Abuse/Suicide Risk Screen Items Answer ABUSE RISK SCREEN: Has anyone close to you tried to hurt or harm you recentlyo No Do you feel uncomfortable with anyone in your familyo No Has anyone forced you do things that you didnot want to doo No Electronic Signature(s) Signed: 07/07/2020 4:56:00 PM By: Gretta Cool, BSN, RN, CWS, Kim RN, BSN Entered By: Gretta Cool, BSN, RN, CWS, Kim on 07/07/2020 09:28:21 [name] ([id number]) -------------------------------------------------------------------------------- Activities of Daily Living Details Patient Name: [name] Date of Service: 07/07/2020 9:15 AM Medical Record Number: [id number] Patient Account Number: [id number] Date of Birth/Sex: [birth year] (54 y.o. Male) Treating RN: Cornell Barman Primary Care Anyely Cunning: [name] Other Clinician: Referring Kimie Pidcock: Referral, Self Treating Julius Matus/Extender: Ricard Dillon Weeks in Treatment: 0 Activities of Daily Living Items Answer Activities of Daily Living (Please select one for each item) Drive Automobile Need Assistance Take Medications Completely Able Use Telephone Completely [place] for Appearance Completely Able Use Toilet Completely Able Bath / Shower Completely Able Dress Self Completely Able Feed Self Completely Able Walk Completely Able Get In / Out Bed Completely [place] for Self Completely Able Electronic Signature(s) Signed: 07/07/2020  4:56:00 PM By: Gretta Cool, BSN, RN, CWS, Kim RN, BSN Entered By: Gretta Cool, BSN, RN, CWS, Kim on 07/07/2020 09:28:41 [name] ([id number]) -------------------------------------------------------------------------------- Education Screening Details Patient Name: [name] Date of Service: 07/07/2020 9:15 AM Medical Record Number: [id number] Patient Account Number: [id number] Date of Birth/Sex: [birth year] (54 y.o. Male) Treating RN: Cornell Barman Primary Care Athea Haley: [name] Other Clinician: Referring Niza Soderholm: Referral, Self Treating Alberta Cairns/Extender: Tito Dine in Treatment: 0 Primary Learner Assessed: Patient Learning Preferences/Education Level/Primary Language Learning Preference: Explanation Highest Education Level: High School Preferred Language: English Cognitive Barrier Language Barrier: No Translator Needed: No Memory Deficit: No Emotional Barrier: No Cultural/Religious Beliefs Affecting Medical Care: No Physical Barrier Impaired Vision: Yes Glasses Impaired Hearing: No Decreased Hand dexterity: No Knowledge/Comprehension Knowledge Level: Medium Comprehension Level: Medium Ability to understand written instructions: Medium Ability to understand verbal instructions: Medium Motivation Anxiety Level: Calm Cooperation: Cooperative Education Importance: Acknowledges Need Interest in Health Problems: Asks Questions Perception: Coherent Willingness to Engage in Self-Management High Activities: Readiness to Engage in Self-Management High Activities: Engineer, maintenance) Signed: 07/07/2020 4:56:00 PM By: Gretta Cool, BSN, RN, CWS, Kim RN, BSN Entered By: Gretta Cool, BSN, RN, CWS, Kim on 07/07/2020 09:29:12 [name], [name] ([id number]) -------------------------------------------------------------------------------- Fall Risk Assessment Details Patient Name: [name] Date of Service: 07/07/2020 9:15 AM Medical Record Number: [id number] Patient  Account Number: [id number] Date of Birth/Sex: 06-Oct-1966 (54 y.o. Male) Treating RN: Cornell Barman Primary Care Bertram Haddix: [name] Other Clinician: Referring Veleria Barnhardt: Referral, Self Treating Tarissa Kerin/Extender: Ricard Dillon Weeks in Treatment: 0 Fall Risk Assessment Items Have you had 2 or more falls in the last 12 monthso 0 No Have you had any fall that resulted in injury in the last 12 monthso 0 No FALLS RISK SCREEN History of falling - immediate or within 3 months 0 No Secondary diagnosis (Do you have 2 or more medical diagnoseso) 0 No Ambulatory aid None/bed rest/wheelchair/nurse  0 No Crutches/cane/walker 15 Yes Furniture 0 No Intravenous therapy Access/Saline/Heparin Lock 0 No Gait/Transferring Normal/ bed rest/ wheelchair 0 Yes Weak (short steps with or without shuffle, stooped but able to lift head while walking, may 0 No seek support from furniture) Impaired (short steps with shuffle, may have difficulty arising from chair, head down, impaired 0 No balance) Mental Status Oriented to own ability 0 Yes Electronic Signature(s) Signed: 07/07/2020 4:56:00 PM By: Gretta Cool, BSN, RN, CWS, Kim RN, BSN Entered By: Gretta Cool, BSN, RN, CWS, Kim on 07/07/2020 09:29:29 [name] ([id number]) -------------------------------------------------------------------------------- Foot Assessment Details Patient Name: [name] Date of Service: 07/07/2020 9:15 AM Medical Record Number: [id number] Patient Account Number: [id number] Date of Birth/Sex: 1965-12-28 (54 y.o. Male) Treating RN: Cornell Barman Primary Care Liboria Putnam: [name] Other Clinician: Referring Carla Whilden: Referral, Self Treating Treyten Monestime/Extender: Ricard Dillon Weeks in Treatment: 0 Foot Assessment Items Site Locations + = Sensation present, - = Sensation absent, C = Callus, U = Ulcer R = Redness, W = Warmth, M = Maceration, PU = Pre-ulcerative lesion F = Fissure, S = Swelling, D =  Dryness Assessment Right: Left: Other Deformity: No No Prior Foot Ulcer: No No Prior Amputation: No No Charcot Joint: No No Ambulatory Status: Ambulatory With Help Assistance Device: Walker GaitEnergy manager) Signed: 07/07/2020 4:56:00 PM By: Gretta Cool, BSN, RN, CWS, Kim RN, BSN Entered By: Gretta Cool, BSN, RN, CWS, Kim on 07/07/2020 09:30:45 [name] ([id number]) -------------------------------------------------------------------------------- Nutrition Risk Screening Details Patient Name: [name] Date of Service: 07/07/2020 9:15 AM Medical Record Number: [id number] Patient Account Number: [id number] Date of Birth/Sex: October 24, 1966 (54 y.o. Male) Treating RN: Cornell Barman Primary Care Kassidy Frankson: [name] Other Clinician: Referring Jorge Retz: Referral, Self Treating Oneika Simonian/Extender: Ricard Dillon Weeks in Treatment: 0 Height (in): Weight (lbs): Body Mass Index (BMI): Nutrition Risk Screening Items Score Screening NUTRITION RISK SCREEN: I have an illness or condition that made me change the kind and/or amount of food I eat 0 No I eat fewer than two meals per day 0 No I eat few fruits and vegetables, or milk products 0 No I have three or more drinks of beer, liquor or wine almost every day 0 No I have tooth or mouth problems that make it hard for me to eat 0 No I don't always have enough money to buy the food I need 0 No I eat alone most of the time 0 No I take three or more different prescribed or over-the-counter drugs a day 1 Yes Without wanting to, I have lost or gained 10 pounds in the last six months 0 No I am not always physically able to shop, cook and/or feed myself 0 No Nutrition Protocols Good Risk Protocol 0 No interventions needed Moderate Risk Protocol High Risk Proctocol Risk Level: Good Risk Score: 1 Electronic Signature(s) Signed: 07/07/2020 4:56:00 PM By: Gretta Cool, BSN, RN, CWS, Kim RN, BSN Entered By: Gretta Cool, BSN, RN, CWS, Kim on  07/07/2020 09:29:40

## 2020-07-07 NOTE — Progress Notes (Addendum)
MICAHEL, OMLOR ([id number]) Visit Report for 07/07/2020 Allergy List Details Patient Name: [name], [name] Date of Service: 07/07/2020 9:15 AM Medical Record Number: [id number] Patient Account Number: [id number] Date of Birth/Sex: 1966/07/24 (54 y.o. Male) Treating RN: Cornell Barman Primary Care Demetrice Amstutz: [name] Other Clinician: Referring Hollin Crewe: Referral, Self Treating Emaad Nanna/Extender: Ricard Dillon Weeks in Treatment: 0 Allergies Active Allergies No Known Drug Allergies Allergy Notes Electronic Signature(s) Signed: 07/07/2020 4:56:00 PM By: Gretta Cool, BSN, RN, CWS, Kim RN, BSN Entered By: Gretta Cool, BSN, RN, CWS, Kim on 07/07/2020 09:21:45 [name] ([id number]) -------------------------------------------------------------------------------- [place] Information Details Patient Name: [name] Date of Service: 07/07/2020 9:15 AM Medical Record Number: [id number] Patient Account Number: [id number] Date of Birth/Sex: Mar 11, 1966 (54 y.o. Male) Treating RN: Cornell Barman Primary Care Kaitland Lewellyn: [name] Other Clinician: Referring Durga Saldarriaga: Referral, Self Treating Ranbir Chew/Extender: Tito Dine in Treatment: 0 Visit Information Patient Arrived: Gilford Rile Arrival Time: 08:58 Accompanied By: wife Transfer Assistance: None Patient Identification Verified: Yes Patient Requires Transmission-Based Precautions: No Patient Has Alerts: No Electronic Signature(s) Signed: 07/07/2020 2:14:25 PM By: Darci Needle Entered By: Darci Needle on 07/07/2020 08:58:57 Luepke, Barbara Cower ([id number]) -------------------------------------------------------------------------------- Encounter Discharge Information Details Patient Name: [name] Date of Service: 07/07/2020 9:15 AM Medical Record Number: [id number] Patient Account Number: [id number] Date of Birth/Sex: Oct 03, 1966 (54 y.o. Male) Treating RN: Cornell Barman Primary Care Dorlis Judice: [name] Other  Clinician: Referring Ivanka Kirshner: Referral, Self Treating Dominque Marlin/Extender: Tito Dine in Treatment: 0 Encounter Discharge Information Items Discharge Condition: Stable Ambulatory Status: Walker Discharge Destination: Home Transportation: Private Auto Accompanied By: wife Schedule Follow-up Appointment: Yes Clinical Summary of Care: Electronic Signature(s) Signed: 07/07/2020 2:24:19 PM By: Gretta Cool, BSN, RN, CWS, Kim RN, BSN Entered By: Gretta Cool, BSN, RN, CWS, Kim on 07/07/2020 14:24:18 [name] ([id number]) -------------------------------------------------------------------------------- Lower Extremity Assessment Details Patient Name: [name] Date of Service: 07/07/2020 9:15 AM Medical Record Number: [id number] Patient Account Number: [id number] Date of Birth/Sex: 1966-04-20 (54 y.o. Male) Treating RN: Cornell Barman Primary Care Haneen Bernales: [name] Other Clinician: Referring Ermine Spofford: Referral, Self Treating Jalisa Sacco/Extender: Ricard Dillon Weeks in Treatment: 0 Edema Assessment Assessed: [Left: No] [Right: No] Edema: [Left: Ye] [Right: s] Calf Left: Right: Point of Measurement: 36 cm From Medial Instep 37.2 cm cm Ankle Left: Right: Point of Measurement: 9 cm From Medial Instep 23 cm cm Vascular Assessment Pulses: Posterior Tibial Palpable: [Left:Yes] Electronic Signature(s) Signed: 07/07/2020 2:14:25 PM By: Darci Needle Signed: 07/07/2020 4:56:00 PM By: Gretta Cool, BSN, RN, CWS, Kim RN, BSN Entered By: Darci Needle on 07/07/2020 09:18:39 [name] ([id number]) -------------------------------------------------------------------------------- Multi Wound Chart Details Patient Name: [name] Date of Service: 07/07/2020 9:15 AM Medical Record Number: [id number] Patient Account Number: [id number] Date of Birth/Sex: [birth year] (54 y.o. Male) Treating RN: Cornell Barman Primary Care Jamilyn Pigeon: [name] Other Clinician: Referring  Aaro Meyers: Referral, Self Treating Shawndell Varas/Extender: Ricard Dillon Weeks in Treatment: 0 Vital Signs Height(in): Pulse(bpm): 45 Weight(lbs): Blood Pressure(mmHg): 118/75 Body Mass Index(BMI): Temperature(F): 97.8 Respiratory Rate(breaths/min): 18 Photos: [N/A:N/A] Wound Location: Left, Lateral Foot N/A N/A Wounding Event: Surgical Injury N/A N/A Primary Etiology: Abscess N/A N/A Date Acquired: 06/27/2020 N/A N/A Weeks of Treatment: 0 N/A N/A Wound Status: Open N/A N/A Measurements L x W x D (cm) 7.8x3x0.8 N/A N/A Area (cm) : 18.378 N/A N/A Volume (cm) : 14.703 N/A N/A Classification: Full Thickness With Exposed N/A N/A Support Structures Exudate Amount: Large N/A N/A Exudate Type: Serosanguineous N/A N/A Exudate Color: red, brown N/A N/A  Wound Margin: Flat and Intact N/A N/A Granulation Amount: Small (1-33%) N/A N/A Granulation Quality: Red N/A N/A Necrotic Amount: Small (1-33%) N/A N/A Exposed Structures: Fat Layer (Subcutaneous Tissue): N/A N/A Yes Procedures Performed: Negative Pressure Wound Therapy N/A N/A Application (NPWT) Treatment Notes Electronic Signature(s) Signed: 07/07/2020 3:56:44 PM By: Linton Ham MD Entered By: Linton Ham on 07/07/2020 09:49:30 [name] ([id number]) -------------------------------------------------------------------------------- [place] Details Patient Name: [name] Date of Service: 07/07/2020 9:15 AM Medical Record Number: [id number] Patient Account Number: [id number] Date of Birth/Sex: July 17, 1966 (54 y.o. Male) Treating RN: Cornell Barman Primary Care Baljit Liebert: [name] Other Clinician: Referring Bion Todorov: Referral, Self Treating Rudransh Bellanca/Extender: Ricard Dillon Weeks in Treatment: 0 Active Inactive Electronic Signature(s) Signed: 08/02/2020 2:58:31 PM By: Gretta Cool, BSN, RN, CWS, Kim RN, BSN Previous Signature: 07/07/2020 4:56:00 PM Version By: Gretta Cool, BSN, RN, CWS, Kim RN,  BSN Entered By: Gretta Cool, BSN, RN, CWS, Kim on 08/02/2020 14:58:30 Fenlon, Barbara Cower ([id number]) -------------------------------------------------------------------------------- Negative Pressure Wound Therapy Application (NPWT) Details Patient Name: [name] Date of Service: 07/07/2020 9:15 AM Medical Record Number: [id number] Patient Account Number: [id number] Date of Birth/Sex: 1966/07/28 (54 y.o. Male) Treating RN: Cornell Barman Primary Care Micaila Ziemba: [name] Other Clinician: Referring Tyreisha Ungar: Referral, Self Treating Teren Zurcher/Extender: Ricard Dillon Weeks in Treatment: 0 NPWT Application Performed for: Wound #1 Left, Lateral Foot Performed By: Cornell Barman, RN Type: Other Coverage Size (sq cm): 23.4 Pressure Type: Constant Pressure Setting: 125 mmHG Drain Type: None Primary Contact: Non-Adherent Sponge/Dressing Type: Foam, Black Date Initiated: 07/01/2020 Post Procedure Diagnosis Same as Pre-procedure Electronic Signature(s) Signed: 07/07/2020 4:56:00 PM By: Gretta Cool, BSN, RN, CWS, Kim RN, BSN Entered By: Gretta Cool, BSN, RN, CWS, Kim on 07/07/2020 09:40:39 [name] ([id number]) -------------------------------------------------------------------------------- Pain Assessment Details Patient Name: [name] Date of Service: 07/07/2020 9:15 AM Medical Record Number: [id number] Patient Account Number: [id number] Date of Birth/Sex: June 12, 1966 (54 y.o. Male) Treating RN: Cornell Barman Primary Care Demorio Seeley: [name] Other Clinician: Referring Ennifer Harston: Referral, Self Treating Kevaughn Ewing/Extender: Ricard Dillon Weeks in Treatment: 0 Active Problems Location of Pain Severity and Description of Pain Patient Has Paino No Site Locations With Dressing Change: No Pain Management and Medication Current Pain Management: Electronic Signature(s) Signed: 07/07/2020 2:14:25 PM By: Darci Needle Signed: 07/07/2020 4:56:00 PM By: Gretta Cool, BSN, RN, CWS, Kim RN, BSN Entered  By: Darci Needle on 07/07/2020 09:00:05 [name] ([id number]) -------------------------------------------------------------------------------- Wound Assessment Details Patient Name: [name] Date of Service: 07/07/2020 9:15 AM Medical Record Number: [id number] Patient Account Number: [id number] Date of Birth/Sex: [birth year] (54 y.o. Male) Treating RN: Cornell Barman Primary Care Ralonda Tartt: [name] Other Clinician: Referring Seger Jani: Referral, Self Treating Janasha Barkalow/Extender: Ricard Dillon Weeks in Treatment: 0 Wound Status Wound Number: 1 Primary Etiology: Abscess Wound Location: Left, Lateral Foot Wound Status: Open Wounding Event: Surgical Injury Date Acquired: 06/27/2020 Weeks Of Treatment: 0 Clustered Wound: No Wound under treatment by Roneshia Drew outside of [place] Photos Wound Measurements Length: (cm) Width: (cm) Depth: (cm) Area: (cm) Volume: (cm) 7.8 % Reduction in Area: 3 % Reduction in Volume: 0.8 18.378 14.703 Wound Description Classification: Full Thickness With Exposed Support Structures Wound Margin: Flat and Intact Exudate Amount: Large Exudate Type: Serosanguineous Exudate Color: red, brown Foul Odor After Cleansing: No Slough/Fibrino Yes Wound Bed Granulation Amount: Small (1-33%) Exposed Structure Granulation Quality: Red Fat Layer (Subcutaneous Tissue) Exposed: Yes Necrotic Amount: Small (1-33%) Necrotic Quality: Adherent Therapist, music) Signed: 07/07/2020 2:14:25 PM By: Darci Needle Signed: 07/07/2020 4:56:00  PM By: Gretta Cool, BSN, RN, CWS, Kim RN, BSN Entered By: Darci Needle on 07/07/2020 09:16:06 [name] ([id number]) -------------------------------------------------------------------------------- [place] Details Patient Name: [name] Date of Service: 07/07/2020 9:15 AM Medical Record Number: [id number] Patient Account Number: [id number] Date of Birth/Sex: [birth year] (54 y.o.  Male) Treating RN: Cornell Barman Primary Care Razia Screws: [name] Other Clinician: Referring [name]: Referral, Self Treating Gaylen Pereira/Extender: Ricard Dillon Weeks in Treatment: 0 Vital Signs Time Taken: 08:50 Temperature (F): 97.8 Pulse (bpm): 78 Respiratory Rate (breaths/min): 18 Blood Pressure (mmHg): 118/75 Reference Range: 80 - 120 mg / dl Electronic Signature(s) Signed: 07/07/2020 2:14:25 PM By: Darci Needle Entered By: Darci Needle on 07/07/2020 09:02:17

## 2020-07-08 ENCOUNTER — Other Ambulatory Visit: Payer: Managed Care, Other (non HMO)

## 2020-07-13 ENCOUNTER — Encounter: Payer: Self-pay | Admitting: Infectious Diseases

## 2020-07-14 DIAGNOSIS — R319 Hematuria, unspecified: Secondary | ICD-10-CM | POA: Insufficient documentation

## 2020-07-14 NOTE — Progress Notes (Deleted)
07/15/2020 1:28 PM   [name] Mar 28, 1966 [id number]  Referring provider: [name], MD [address] [place],  [place] [address]  No chief complaint on file.   HPI: [name] is a 54 year old male with BPH with LU TS, nocturia and ED who presents today for a follow up appointment.  BPH WITH LUTS  (prostate and/or bladder) IPSS score: *** PVR: ***   Previous score: 4/2   Previous PVR: 25 mL  Major complaint(s):  x *** years. Denies any dysuria, hematuria or suprapubic pain.   Currently taking: ***.  His has had ***.   Denies any recent fevers, chills, nausea or vomiting.  He has a family history of PCa, with ***.   He does not have a family history of PCa.***    Score:  1-7 Mild 8-19 Moderate 20-35 Severe   Erectile dysfunction His SHIM score today is ***, which is *** ED.  His previous SHIM was 9.  He has been having difficulty with erections for last several years.   His major complaint is lack of firmness with erections.  His libido is preserved.   His risk factors for ED are age, BPH, DM, HTN, HLD, alcohol abuse and blood pressure medications.  He denies any painful erections or curvatures with his erections.   He is still having spontaneous erections.  He has been having okay results with sildenafil and would like a script for Levitra.     PMH: Past Medical History:  Diagnosis Date  . Diabetes mellitus without complication ([place])   . Hyperlipidemia   . Hypertension     Surgical History: Past Surgical History:  Procedure Laterality Date  . BONE BIOPSY Left 06/27/2020   Procedure: BONE BIOPSY;  Surgeon: Caroline More, DPM;  Location: [facility];  Service: Podiatry;  Laterality: Left;  . INCISION AND DRAINAGE Left 06/27/2020   Procedure: INCISION AND DRAINAGE;  Surgeon: Caroline More, DPM;  Location: [facility];  Service: Podiatry;  Laterality: Left;  . LOWER EXTREMITY ANGIOGRAPHY Left 06/30/2020   Procedure: Lower Extremity Angiography;   Surgeon: Algernon Huxley, MD;  Location: [facility];  Service: Cardiovascular;  Laterality: Left;  . NO PAST SURGERIES      Home Medications:  Allergies as of 07/15/2020   No Known Allergies     Medication List       Accurate as of July 14, 2020  1:28 PM. If you have any questions, ask your nurse or doctor.        amLODipine 10 MG tablet Commonly known as: NORVASC TAKE 1 TABLET(10 MG) BY MOUTH DAILY   aspirin 81 MG tablet Take by mouth.   Dalvance 500 MG Solr Generic drug: dalbavancin Inject 500-1,000 mg into the vein every 7 (seven) days for 5 doses. Dalbavancin 1063m IV x 1 then in one week, start 5080mIV q 7 days x 4 doses Indication: Left foot osteomyelitis with MSSA, group B strep First Dose:No Last Day of Therapy: 07/30/2020 Labs - Once weekly:  CBC/D, BMP, ESR and CRP Method of administration may be changed at the discretion of home infusion pharmacist based upon assessment of the patient and/or caregiver's ability to self-administer the medication ordered.   glipiZIDE 10 MG 24 hr tablet Commonly known as: GLUCOTROL XL Take 1 tablet (10 mg total) by mouth daily with breakfast.   Jardiance 25 MG Tabs tablet Generic drug: empagliflozin TAKE 1 TABLET BY MOUTH ONCE DAILY   metroNIDAZOLE 500 MG  tablet Commonly known as: FLAGYL Take 1 tablet (500 mg total) by mouth every 8 (eight) hours for 14 days.   rosuvastatin 40 MG tablet Commonly known as: CRESTOR TAKE 1 TABLET BY MOUTH EVERY OTHER DAY. USUALLY TAKES MONDAY-FRIDAY   torsemide 10 MG tablet Commonly known as: DEMADEX Take by mouth.   valsartan-hydrochlorothiazide 320-25 MG tablet Commonly known as: DIOVAN-HCT TAKE 1 TABLET BY MOUTH EVERY DAY( IN PLACE OF LISINOPRIL/HCTZ)   Vitamin D (Ergocalciferol) 1.25 MG (50000 UNIT) Caps capsule Commonly known as: DRISDOL TAKE ONE CAPSULE BY MOUTH ONCE A WEEK FOR 12 WEEKS       Allergies: No Known Allergies  Family History: Family History  Problem  Relation Age of Onset  . Heart disease Father   . Heart attack Father   . Healthy Mother   . Prostate cancer Neg Hx   . Kidney cancer Neg Hx   . Bladder Cancer Neg Hx     Social History:  reports that he has never smoked. He has never used smokeless tobacco. He reports current alcohol use of about 6.0 - 12.0 standard drinks of alcohol per week. He reports that he does not use drugs.  ROS:                                        Physical Exam: There were no vitals taken for this visit.  Constitutional:  Well nourished. Alert and oriented, No acute distress. HEENT: [place] AT, moist mucus membranes.  Trachea midline, no masses. Cardiovascular: No clubbing, cyanosis, or edema. Respiratory: Normal respiratory effort, no increased work of breathing. GI: Abdomen is soft, non tender, non distended, no abdominal masses. Liver and spleen not palpable.  No hernias appreciated.  Stool sample for occult testing is not indicated.   GU: No CVA tenderness.  No bladder fullness or masses.  Patient with circumcised phallus.  Urethral meatus is patent.  No penile discharge. No penile lesions or rashes. Scrotum without lesions, cysts, rashes and/or edema.  Testicles are located scrotally bilaterally. No masses are appreciated in the testicles. Left and right epididymis are normal. Rectal: Patient with  normal sphincter tone. Anus and perineum without scarring or rashes. No rectal masses are appreciated. Prostate is approximately 50 grams, no nodules are appreciated. Seminal vesicles are normal. Skin: No rashes, bruises or suspicious lesions. Lymph: No cervical or inguinal adenopathy. Neurologic: Grossly intact, no focal deficits, moving all 4 extremities. Psychiatric: Normal mood and affect.  Laboratory Data: PSA History  0.6 ng/mL on 02/27/2017   Lab Results  Component Value Date   CREATININE 0.95 06/30/2020    Lab Results  Component Value Date   HGBA1C 8.0 (H) 06/25/2020        Component Value Date/Time   CHOL 190 11/28/2019 0000   CHOL 247 (H) 06/19/2016 1655   HDL 46 11/28/2019 0000   HDL 60 06/19/2016 1655   CHOLHDL 4.1 11/28/2019 0000   LDLCALC 114 (H) 11/28/2019 0000    Lab Results  Component Value Date   AST 14 (L) 06/25/2020   Lab Results  Component Value Date   ALT 16 06/25/2020     Pertinent Imaging: Results for SUPREME, RYBARCZYK (MRN [id number]) as of 04/24/2019 13:15  Ref. Range 04/14/2019 14:14  Scan Result Unknown 47m    Assessment & Plan:    1. BPH with LUTS  - IPSS score is 4/2, it is stable   -  Continue conservative management, avoiding bladder irritants and timed voiding's  - Not having bothersome symptoms at this time  - BLADDER SCAN AMB NON-IMAGING  - PSA drawn today - follow up in one year if PSA is normal  - RTC in one year for I PSS, PSA and exam   2. Nocturia  - he has not had a sleep study  3. Erectile dysfunction  - script given for Levitra 20 mg - take one tablet two hours prior to intercourse on an empty stomach,  he is warned not to take medications that contain nitrates.  I also advised him of the side effects, such as: headache, flushing, dyspepsia, abnormal vision, nasal congestion, back pain, myalgia, nausea, dizziness, and rash.  - RTC in 12 months for repeat SHIM score and exam   No follow-ups on file.  These notes generated with voice recognition software. I apologize for typographical errors.  Zara Council, PA-C  [facility] [address] [place], [place] [address] [phone]

## 2020-07-15 ENCOUNTER — Ambulatory Visit: Payer: Managed Care, Other (non HMO) | Admitting: Urology

## 2020-07-15 ENCOUNTER — Ambulatory Visit: Payer: Managed Care, Other (non HMO)

## 2020-07-16 ENCOUNTER — Encounter: Payer: Self-pay | Admitting: Urology

## 2020-07-22 ENCOUNTER — Encounter: Payer: Self-pay | Admitting: Infectious Diseases

## 2020-07-22 ENCOUNTER — Other Ambulatory Visit: Payer: Self-pay

## 2020-07-22 ENCOUNTER — Ambulatory Visit: Payer: Managed Care, Other (non HMO) | Attending: Infectious Diseases | Admitting: Infectious Diseases

## 2020-07-22 VITALS — BP 145/88 | HR 79 | Temp 97.5°F | Resp 17 | Ht 71.0 in | Wt 208.0 lb

## 2020-07-22 DIAGNOSIS — E1169 Type 2 diabetes mellitus with other specified complication: Secondary | ICD-10-CM | POA: Diagnosis not present

## 2020-07-22 DIAGNOSIS — Y838 Other surgical procedures as the cause of abnormal reaction of the patient, or of later complication, without mention of misadventure at the time of the procedure: Secondary | ICD-10-CM | POA: Diagnosis not present

## 2020-07-22 DIAGNOSIS — Z792 Long term (current) use of antibiotics: Secondary | ICD-10-CM | POA: Insufficient documentation

## 2020-07-22 DIAGNOSIS — Z7984 Long term (current) use of oral hypoglycemic drugs: Secondary | ICD-10-CM | POA: Insufficient documentation

## 2020-07-22 DIAGNOSIS — B9561 Methicillin susceptible Staphylococcus aureus infection as the cause of diseases classified elsewhere: Secondary | ICD-10-CM | POA: Insufficient documentation

## 2020-07-22 DIAGNOSIS — M868X7 Other osteomyelitis, ankle and foot: Secondary | ICD-10-CM | POA: Insufficient documentation

## 2020-07-22 DIAGNOSIS — T8149XD Infection following a procedure, other surgical site, subsequent encounter: Secondary | ICD-10-CM | POA: Insufficient documentation

## 2020-07-22 DIAGNOSIS — E785 Hyperlipidemia, unspecified: Secondary | ICD-10-CM | POA: Diagnosis not present

## 2020-07-22 DIAGNOSIS — Z79899 Other long term (current) drug therapy: Secondary | ICD-10-CM | POA: Diagnosis not present

## 2020-07-22 DIAGNOSIS — Z89422 Acquired absence of other left toe(s): Secondary | ICD-10-CM | POA: Diagnosis not present

## 2020-07-22 DIAGNOSIS — B954 Other streptococcus as the cause of diseases classified elsewhere: Secondary | ICD-10-CM | POA: Diagnosis not present

## 2020-07-22 DIAGNOSIS — E1165 Type 2 diabetes mellitus with hyperglycemia: Secondary | ICD-10-CM | POA: Insufficient documentation

## 2020-07-22 DIAGNOSIS — B951 Streptococcus, group B, as the cause of diseases classified elsewhere: Secondary | ICD-10-CM | POA: Diagnosis not present

## 2020-07-22 DIAGNOSIS — I10 Essential (primary) hypertension: Secondary | ICD-10-CM | POA: Diagnosis not present

## 2020-07-22 DIAGNOSIS — L089 Local infection of the skin and subcutaneous tissue, unspecified: Secondary | ICD-10-CM

## 2020-07-22 DIAGNOSIS — Z7982 Long term (current) use of aspirin: Secondary | ICD-10-CM | POA: Diagnosis not present

## 2020-07-22 DIAGNOSIS — E11628 Type 2 diabetes mellitus with other skin complications: Secondary | ICD-10-CM

## 2020-07-22 NOTE — Progress Notes (Signed)
NAME: [name]  DOB: Feb 24, 1966  MRN: [id number]  Date/Time: 07/22/2020 10:20 AM  Subjective:   ?Patient is here with his wife. Follow-up after recent hospitalization. [name] is a 54 y.o. male with a history of diabetes mellitus, hypertension, hyperlipidemia Was recently in [facility] between 06/25/2020 until 07/01/2020 for a left foot infection. MRI of the foot done on 06/25/2020 revealed large area of ulceration over the lateral base of the fifth metatarsal head with a sinus tract.  There was subcutaneous emphysema and a loculated abscess also.  The very early signs of osteomyelitis involving the fifth metatarsal base.  Superficial culture had staph aureus and Prevotella.  He underwent on 06/27/2020 left fifth metatarsal base partial amputation and resection, incision and drainage of the left foot and peroneus brevis to peroneus longus anastomosis.  Surgical cultures had Streptococcus mitis and group B streptococcus.  There were no anaerobes.  After getting IV vancomycin and ceftriaxone in the hospital he was discharged on once a week dalbavancin for 5 weeks.  His last dose was on 07/30/2020. Patient still has a wound VAC. He saw Dr. Luana Shu last week. He has not had any fever or chills.  He is doing pretty well. Patient does not check his blood sugar regularly.   Past Medical History:  Diagnosis Date  . Diabetes mellitus without complication ([place])   . Hyperlipidemia   . Hypertension     Past Surgical History:  Procedure Laterality Date  . BONE BIOPSY Left 06/27/2020   Procedure: BONE BIOPSY;  Surgeon: Caroline More, DPM;  Location: [facility];  Service: Podiatry;  Laterality: Left;  . INCISION AND DRAINAGE Left 06/27/2020   Procedure: INCISION AND DRAINAGE;  Surgeon: Caroline More, DPM;  Location: [facility];  Service: Podiatry;  Laterality: Left;  . LOWER EXTREMITY ANGIOGRAPHY Left 06/30/2020   Procedure: Lower Extremity Angiography;  Surgeon: Algernon Huxley, MD;  Location: [place] [facility];   Service: Cardiovascular;  Laterality: Left;  . NO PAST SURGERIES      Social History   Socioeconomic History  . Marital status: Married    Spouse name: Not on file  . Number of children: 1  . Years of education: Not on file  . Highest education level: Not on file  Occupational History  . Not on file  Tobacco Use  . Smoking status: Never Smoker  . Smokeless tobacco: Never Used  Vaping Use  . Vaping Use: Never used  Substance and Sexual Activity  . Alcohol use: Yes    Alcohol/week: 6.0 - 12.0 standard drinks    Types: 6 - 12 Cans of beer per week    Comment: Occasional  . Drug use: No  . Sexual activity: Yes  Other Topics Concern  . Not on file  Social History Narrative  . Not on file   Social Determinants of Health   Financial Resource Strain:   . Difficulty of Paying Living Expenses:   Food Insecurity:   . Worried About Charity fundraiser in the Last Year:   . Arboriculturist in the Last Year:   Transportation Needs:   . Film/video editor (Medical):   Marland Kitchen Lack of Transportation (Non-Medical):   Physical Activity:   . Days of Exercise per Week:   . Minutes of Exercise per Session:   Stress:   . Feeling of Stress :   Social Connections:   . Frequency of Communication with Friends and Family:   . Frequency of Social Gatherings  with Friends and Family:   . Attends Religious Services:   . Active Member of Clubs or Organizations:   . Attends Archivist Meetings:   Marland Kitchen Marital Status:   Intimate Partner Violence:   . Fear of Current or Ex-Partner:   . Emotionally Abused:   Marland Kitchen Physically Abused:   . Sexually Abused:     Family History  Problem Relation Age of Onset  . Heart disease Father   . Heart attack Father   . Healthy Mother   . Prostate cancer Neg Hx   . Kidney cancer Neg Hx   . Bladder Cancer Neg Hx    No Known Allergies  Current Outpatient Medications  Medication Sig Dispense Refill  . amLODipine (NORVASC) 10 MG tablet TAKE 1  TABLET(10 MG) BY MOUTH DAILY 90 tablet 1  . aspirin 81 MG tablet Take by mouth.    . dalbavancin (DALVANCE) 500 MG SOLR Inject 500-1,000 mg into the vein every 7 (seven) days for 5 doses. Dalbavancin 1061m IV x 1 then in one week, start 5055mIV q 7 days x 4 doses Indication: Left foot osteomyelitis with MSSA, group B strep First Dose:No Last Day of Therapy: 07/30/2020 Labs - Once weekly:  CBC/D, BMP, ESR and CRP Method of administration may be changed at the discretion of home infusion pharmacist based upon assessment of the patient and/or caregiver's ability to self-administer the medication ordered. 1 each 4  . glipiZIDE (GLUCOTROL XL) 10 MG 24 hr tablet Take 1 tablet (10 mg total) by mouth daily with breakfast. 90 tablet 1  . JARDIANCE 25 MG TABS tablet TAKE 1 TABLET BY MOUTH ONCE DAILY 90 tablet 0  . ofloxacin (OCUFLOX) 0.3 % ophthalmic solution     . rosuvastatin (CRESTOR) 40 MG tablet TAKE 1 TABLET BY MOUTH EVERY OTHER DAY. USUALLY TAKES MONDAY-FRIDAY 90 tablet 1  . torsemide (DEMADEX) 10 MG tablet Take by mouth.    . valsartan-hydrochlorothiazide (DIOVAN-HCT) 320-25 MG tablet TAKE 1 TABLET BY MOUTH EVERY DAY( IN PLACE OF LISINOPRIL/HCTZ) 90 tablet 0  . Vitamin D, Ergocalciferol, (DRISDOL) 1.25 MG (50000 UNIT) CAPS capsule TAKE ONE CAPSULE BY MOUTH ONCE A WEEK FOR 12 WEEKS 12 capsule 0   No current facility-administered medications for this visit.     Abtx:  Anti-infectives (From admission, onward)   None      REVIEW OF SYSTEMS:  Const: negative fever, negative chills, negative weight loss Eyes: negative diplopia or visual changes, negative eye pain ENT: negative coryza, negative sore throat Resp: negative cough, hemoptysis, dyspnea Cards: negative for chest pain, palpitations, lower extremity edema GU: negative for frequency, dysuria and hematuria GI: Negative for abdominal pain, diarrhea, bleeding, constipation Skin: negative for rash and pruritus Heme: negative for easy  bruising and gum/nose bleeding MS: negative for myalgias, arthralgias, back pain and muscle weakness Neurolo:negative for headaches, dizziness, vertigo, memory problems  Psych: negative for feelings of anxiety, depression  Endocrine:dm Allergy/Immunology- negative for any medication or food allergies ?  Objective:  VITALS:  BP (!) 145/88   Pulse 79   Temp (!) 97.5 F (36.4 C)   Resp 17   Ht '5\' 11"'  (1.803 m)   Wt 208 lb (94.3 kg)   SpO2 98%   BMI 29.01 kg/m  PHYSICAL EXAM:  General: Alert, cooperative, no distress, appears stated age.  Head: Normocephalic, without obvious abnormality, atraumatic. Eyes: Conjunctivae clear, anicteric sclerae. Pupils are equal ENT Nares normal. No drainage or sinus tenderness. Lips, mucosa, and tongue normal.  No Thrush Neck: Supple, symmetrical, no adenopathy, thyroid: non tender no carotid bruit and no JVD. Back: No CVA tenderness. Lungs: Clear to auscultation bilaterally. No Wheezing or Rhonchi. No rales. Heart: Regular rate and rhythm, no murmur, rub or gallop. Abdomen: Did not examine Extremities: Left foot wound VAC removed The surgical wound is healing except for 2 areas of skin breakdown.  There is some maceration around the small wound. The base of the wound looks healthy  07/23/20   07/01/20   06/25/20    Skin: No rashes or lesions. Or bruising Lymph: Cervical, supraclavicular normal. Neurologic: Grossly non-focal Pertinent Labs Lab Results CBC ESR was 68 on 06/26/2020. ESR 18 on 07/13/2020 CRP was 15.5 on 06/27/2020. CRP 2 on 07/13/2020. Creatinine 1.22 from 07/13/2020. Blood glucose 397 on 07/13/2020. ? Impression/Recommendation ? Diabetic foot infection left.  Osteomyelitis of the left fifth metatarsal base.  Status post partial resection of the metatarsal base, incision and debridement of the soft tissue infection. Patient grew Streptococcus mitis and group B streptococcus from surgical culture.  Superficial culture had staph  aureus and Prevotella. I removed his wound VAC and put a wet-to-dry dressing.  The surgical site has healed well except for 2 small areas of superficial wound breakdown with some maceration.  This was the site of the severe infection.He he is on a long-acting glyco peptide called dalbavancin once a week.  End date is 07/30/2020 when you would have received 5 weeks of IV antibiotic.  We may do oral antibiotics after that with Augmentin.   Patient is followed by Dr. Luana Shu and will be seeing him next week.  Diabetes mellitus: Looks like it is poorly controlled.  There is some diabetic issues especially patient eating a lot of fruits first thing in the morning without protein intake.  Patient will follow up with his PCP and also discussed about getting back to the nutritionist.  Hypertension on medications  Discussed with patient and his wife regarding vaccine for coronavirus.  He is not interested. Follow-up as needed  ? Note:  This document was prepared using Dragon voice recognition software and may include unintentional dictation errors.

## 2020-07-22 NOTE — Patient Instructions (Signed)
You are here for follow up of the left foot-  you have infection of the left foot. Continue Dalbavancin - last day is 07/30/20- your ESR and CRP are both  Normal Follow with Dr.BAker

## 2020-07-23 ENCOUNTER — Other Ambulatory Visit: Payer: Self-pay | Admitting: Family Medicine

## 2020-07-23 DIAGNOSIS — E1169 Type 2 diabetes mellitus with other specified complication: Secondary | ICD-10-CM

## 2020-07-23 DIAGNOSIS — E118 Type 2 diabetes mellitus with unspecified complications: Secondary | ICD-10-CM

## 2020-07-23 DIAGNOSIS — I10 Essential (primary) hypertension: Secondary | ICD-10-CM

## 2020-08-28 ENCOUNTER — Other Ambulatory Visit: Payer: Self-pay | Admitting: Family Medicine

## 2020-08-28 DIAGNOSIS — E559 Vitamin D deficiency, unspecified: Secondary | ICD-10-CM

## 2020-08-28 NOTE — Telephone Encounter (Signed)
Requested medication (s) are due for refill today: yes  Requested medication (s) are on the active medication list: yes  Last refill:  06/07/20  Future visit scheduled: no  Notes to clinic:  med not delegated to NT to RF   Requested Prescriptions  Pending Prescriptions Disp Refills   Vitamin D, Ergocalciferol, (DRISDOL) 1.25 MG (50000 UNIT) CAPS capsule [Pharmacy Med Name: VITAMIN D2 50,000IU (ERGO) CAP RX] 12 capsule 0    Sig: TAKE ONE CAPSULE BY MOUTH ONCE A WEEK FOR 12 WEEKS      Endocrinology:  Vitamins - Vitamin D Supplementation Failed - 08/28/2020 10:09 AM      Failed - 50,000 IU strengths are not delegated      Failed - Phosphate in normal range and within 360 days    No results found for: PHOS        Failed - Vitamin D in normal range and within 360 days    Vit D, 25-Hydroxy  Date Value Ref Range Status  07/02/2019 32 30 - 100 ng/mL Final    Comment:    Vitamin D Status         25-OH Vitamin D: . Deficiency:                    <20 ng/mL Insufficiency:             20 - 29 ng/mL Optimal:                 > or = 30 ng/mL . For 25-OH Vitamin D testing on patients on  D2-supplementation and patients for whom quantitation  of D2 and D3 fractions is required, the [employer](TM) 25-OH VIT D, (D2,D3), LC/MS/MS is recommended: order  code [id number] (patients >40yrs). See Note 1 . Note 1 . For additional information, please refer to  http://education.Quest[facility].com/faq/FAQ199  (This link is being provided for informational/ educational purposes only.)           Passed - Ca in normal range and within 360 days    Calcium  Date Value Ref Range Status  06/29/2020 9.1 8.9 - 10.3 mg/dL Final          Passed - Valid encounter within last 12 months    Recent Outpatient Visits           6 months ago Type 2 diabetes mellitus with chronic kidney disease and hypertension [facility])   [facility] Lebron Conners D, MD   9 months ago Dyslipidemia  associated with type 2 diabetes mellitus [facility])   [facility] [name], MD   1 year ago Vitamin D deficiency   [facility] [place], Drue Stager, MD   1 year ago Dyslipidemia associated with type 2 diabetes mellitus [facility])   [facility] [name], MD   1 year ago Type 2 diabetes with complication [facility])   [facility] [name], MD

## 2020-09-02 ENCOUNTER — Other Ambulatory Visit: Payer: Self-pay | Admitting: Family Medicine

## 2020-09-02 ENCOUNTER — Other Ambulatory Visit: Payer: Self-pay | Admitting: *Deleted

## 2020-09-02 DIAGNOSIS — E785 Hyperlipidemia, unspecified: Secondary | ICD-10-CM

## 2020-09-02 DIAGNOSIS — E1169 Type 2 diabetes mellitus with other specified complication: Secondary | ICD-10-CM

## 2020-09-02 DIAGNOSIS — E669 Obesity, unspecified: Secondary | ICD-10-CM

## 2020-09-02 DIAGNOSIS — E118 Type 2 diabetes mellitus with unspecified complications: Secondary | ICD-10-CM

## 2020-09-02 DIAGNOSIS — I10 Essential (primary) hypertension: Secondary | ICD-10-CM

## 2020-09-02 DIAGNOSIS — E559 Vitamin D deficiency, unspecified: Secondary | ICD-10-CM

## 2020-09-02 NOTE — Addendum Note (Signed)
Addended by: Chilton Greathouse on: 09/02/2020 11:25 AM   Modules accepted: Orders

## 2020-09-02 NOTE — Telephone Encounter (Signed)
Requested Prescriptions  Pending Prescriptions Disp Refills   glipiZIDE (GLUCOTROL XL) 10 MG 24 hr tablet [Pharmacy Med Name: GLIPIZIDE ER 10MG  TABLETS] 90 tablet 1    Sig: TAKE 1 TABLET(10 MG) BY MOUTH DAILY WITH BREAKFAST     Endocrinology:  Diabetes - Sulfonylureas Failed - 09/02/2020 10:10 AM      Failed - HBA1C is between 0 and 7.9 and within 180 days    HbA1c, POC (prediabetic range)  Date Value Ref Range Status  12/18/2018 7.6 (A) 5.7 - 6.4 % Final   HbA1c, POC (controlled diabetic range)  Date Value Ref Range Status  07/02/2019 7.8 (A) 0.0 - 7.0 % Final   Hgb A1c MFr Bld  Date Value Ref Range Status  06/25/2020 8.0 (H) 4.8 - 5.6 % Final    Comment:    (NOTE) Pre diabetes:          5.7%-6.4%  Diabetes:              >6.4%  Glycemic control for   <7.0% adults with diabetes          Failed - Valid encounter within last 6 months    Recent Outpatient Visits          6 months ago Type 2 diabetes mellitus with chronic kidney disease and hypertension [facility])   [facility] Lebron Conners D, MD   9 months ago Dyslipidemia associated with type 2 diabetes mellitus [facility])   [facility] [name], MD   1 year ago Vitamin D deficiency   [facility] [place], Drue Stager, MD   1 year ago Dyslipidemia associated with type 2 diabetes mellitus [facility])   [facility] [name], MD   1 year ago Type 2 diabetes with complication [facility])   [facility] [name], MD

## 2020-09-02 NOTE — Telephone Encounter (Signed)
Refill request for Glipizide; last refill 02/29/20; no valid encounter within last 6 months; no upcoming visits noted; notified patient; 30 day courtesy refill granted; visit needed for additional refills; decision tree completed; pt offered and accepted virtual appt with Dr Ancil Boozer, [place] 10/11/20 at 1420; he verbalized understanding; the patient would also like to have his vitamin D refilled; he states the pharmacy had contacted him because he was out of refills; will route to office for final disposition. Requested medication (s) are due for refill today: Vit D., yes  Requested medication (s) are on the active medication list : yes   Last refill: 06/07/20  Future visit scheduled: Yes  Notes to clinic:  Not delegated

## 2020-09-03 ENCOUNTER — Other Ambulatory Visit: Payer: Self-pay

## 2020-09-03 DIAGNOSIS — E669 Obesity, unspecified: Secondary | ICD-10-CM

## 2020-09-03 DIAGNOSIS — I10 Essential (primary) hypertension: Secondary | ICD-10-CM

## 2020-09-03 DIAGNOSIS — E118 Type 2 diabetes mellitus with unspecified complications: Secondary | ICD-10-CM

## 2020-09-03 DIAGNOSIS — E1169 Type 2 diabetes mellitus with other specified complication: Secondary | ICD-10-CM

## 2020-09-10 ENCOUNTER — Telehealth: Payer: Self-pay

## 2020-09-10 NOTE — Telephone Encounter (Signed)
Copied from [name] [phone]. Topic: General - Other >> Sep 10, 2020 11:02 AM [name] D wrote: PT asking witch Iron pills will be the best for him / please advise

## 2020-09-13 NOTE — Telephone Encounter (Signed)
Called patient. No answer. LVM informing him that he doesn't need iron pills. No anemia.

## 2020-10-01 ENCOUNTER — Other Ambulatory Visit: Payer: Self-pay | Admitting: Family Medicine

## 2020-10-01 DIAGNOSIS — E559 Vitamin D deficiency, unspecified: Secondary | ICD-10-CM

## 2020-10-11 ENCOUNTER — Ambulatory Visit (INDEPENDENT_AMBULATORY_CARE_PROVIDER_SITE_OTHER): Payer: Managed Care, Other (non HMO) | Admitting: Family Medicine

## 2020-10-11 ENCOUNTER — Encounter: Payer: Self-pay | Admitting: Family Medicine

## 2020-10-11 VITALS — Ht 71.0 in | Wt 208.0 lb

## 2020-10-11 DIAGNOSIS — I10 Essential (primary) hypertension: Secondary | ICD-10-CM

## 2020-10-11 DIAGNOSIS — E669 Obesity, unspecified: Secondary | ICD-10-CM

## 2020-10-11 DIAGNOSIS — E559 Vitamin D deficiency, unspecified: Secondary | ICD-10-CM | POA: Diagnosis not present

## 2020-10-11 DIAGNOSIS — N2581 Secondary hyperparathyroidism of renal origin: Secondary | ICD-10-CM

## 2020-10-11 DIAGNOSIS — I129 Hypertensive chronic kidney disease with stage 1 through stage 4 chronic kidney disease, or unspecified chronic kidney disease: Secondary | ICD-10-CM

## 2020-10-11 DIAGNOSIS — E785 Hyperlipidemia, unspecified: Secondary | ICD-10-CM

## 2020-10-11 DIAGNOSIS — E118 Type 2 diabetes mellitus with unspecified complications: Secondary | ICD-10-CM

## 2020-10-11 DIAGNOSIS — E1122 Type 2 diabetes mellitus with diabetic chronic kidney disease: Secondary | ICD-10-CM

## 2020-10-11 DIAGNOSIS — L089 Local infection of the skin and subcutaneous tissue, unspecified: Secondary | ICD-10-CM

## 2020-10-11 DIAGNOSIS — E11628 Type 2 diabetes mellitus with other skin complications: Secondary | ICD-10-CM

## 2020-10-11 DIAGNOSIS — E1169 Type 2 diabetes mellitus with other specified complication: Secondary | ICD-10-CM

## 2020-10-11 DIAGNOSIS — IMO0001 Reserved for inherently not codable concepts without codable children: Secondary | ICD-10-CM

## 2020-10-11 MED ORDER — VALSARTAN-HYDROCHLOROTHIAZIDE 320-25 MG PO TABS: 1.0000 | ORAL_TABLET | Freq: Every day | ORAL | 0 refills | Status: DC

## 2020-10-11 MED ORDER — EMPAGLIFLOZIN 25 MG PO TABS: 25.0000 mg | ORAL_TABLET | Freq: Every day | ORAL | 0 refills | Status: DC

## 2020-10-11 MED ORDER — GLIPIZIDE ER 10 MG PO TB24: 10.0000 mg | ORAL_TABLET | Freq: Every day | ORAL | 0 refills | Status: DC

## 2020-10-11 NOTE — Progress Notes (Signed)
Name: [name]   MRN: [id number]    DOB: Jun 12, 1966   Date:10/11/2020       Progress Note  Subjective  Chief Complaint  Chief Complaint  Patient presents with  . Follow-up  . Medication Refill    I connected with  Illene Dillon  on 10/11/20 at  2:20 PM EDT by a video enabled telemedicine application and verified that I am speaking with the correct person using two identifiers.  I discussed the limitations of evaluation and management by telemedicine and the availability of in person appointments. The patient expressed understanding and agreed to proceed with the virtual visit  Staff also discussed with the patient that there may be a patient responsible charge related to this service. Patient Location: at home Provider Location: [facility]   HPI    DMII: he has not been seen in our office since Dec 2020, he went to [facility] back in July because his left left foot ulcer was larger and foul smelling, he was admitted, diagnosed with osteomyelitis. He states he is doing well now. Still under the care of Dr. Luana Shu - podiatrist, sees Dr. Candiss Norse for his CKI/HTN/DM and secondary hyperparathyroidism. He does not have a bp cuff at home but states it was always normal when the home health nurse was at his hour.He is up to date with eye exam - he is not sure who he saw but had a blockage and was told to stop taking aspirin. He denies polyphagia, polydipsia or polyuria He states he has been taking medications as prescribed  Diabetic foot ulcer with osteomyelitis: he is under the care of ID and also Dr. Luana Shu - podiatrist [facility]), he finished home infusions and is doing well, states no longer has any open wounds.   Macro albuminuria with secondary hyperparathyroidism: reviewed labs done at home health and scanned to the chart  HTN:He is on Norvasc and valsartan hctz , explained that he needs to come in person for his next visit   Dyslipidemia: heis on Crestor, currently taking it daily, no  side effects, explained he needs to come in person next time for lab work     Patient Active Problem List   Diagnosis Date Noted  . Blood in urine 07/14/2020  . Diabetic foot ulcer ([place]) 06/25/2020  . Diabetic foot infection ([place])   . Localized edema 10/14/2019  . Proteinuria 10/14/2019  . Secondary renal hyperparathyroidism ([place]) 03/19/2019  . Type 2 diabetes mellitus with left diabetic foot ulcer ([place]) 12/18/2018  . Type 2 diabetes mellitus with chronic kidney disease and hypertension ([place]) 12/18/2018  . Type 2 diabetes mellitus with hyperlipidemia ([place]) 12/18/2018  . Dyslipidemia associated with type 2 diabetes mellitus ([place]) 07/03/2018  . Vitamin D deficiency 04/11/2018  . ED (erectile dysfunction) 04/10/2018  . Lower urinary tract symptoms (LUTS) 02/09/2017  . Hypertension 07/05/2015  . Diabetes mellitus type 2, uncontrolled ([place]) 07/05/2015  . HLD (hyperlipidemia) 07/05/2015    Past Surgical History:  Procedure Laterality Date  . BONE BIOPSY Left 06/27/2020   Procedure: BONE BIOPSY;  Surgeon: Caroline More, DPM;  Location: [facility] ORS;  Service: Podiatry;  Laterality: Left;  . INCISION AND DRAINAGE Left 06/27/2020   Procedure: INCISION AND DRAINAGE;  Surgeon: Caroline More, DPM;  Location: [facility] ORS;  Service: Podiatry;  Laterality: Left;  . LOWER EXTREMITY ANGIOGRAPHY Left 06/30/2020   Procedure: Lower Extremity Angiography;  Surgeon: Algernon Huxley, MD;  Location: [place] [facility];  Service: Cardiovascular;  Laterality:  Left;  . NO PAST SURGERIES      Family History  Problem Relation Age of Onset  . Heart disease Father   . Heart attack Father   . Healthy Mother   . Prostate cancer Neg Hx   . Kidney cancer Neg Hx   . Bladder Cancer Neg Hx       Current Outpatient Medications:  .  amLODipine (NORVASC) 10 MG tablet, TAKE 1 TABLET(10 MG) BY MOUTH DAILY, Disp: 90 tablet, Rfl: 1 .  glipiZIDE (GLUCOTROL XL) 10 MG 24 hr tablet, TAKE 1 TABLET(10 MG) BY MOUTH DAILY WITH  BREAKFAST, Disp: 30 tablet, Rfl: 0 .  JARDIANCE 25 MG TABS tablet, TAKE 1 TABLET BY MOUTH ONCE DAILY, Disp: 90 tablet, Rfl: 0 .  rosuvastatin (CRESTOR) 40 MG tablet, TAKE 1 TABLET BY MOUTH EVERY OTHER DAY. USUALLY TAKES MONDAY-FRIDAY, Disp: 90 tablet, Rfl: 1 .  torsemide (DEMADEX) 10 MG tablet, Take by mouth., Disp: , Rfl:  .  valsartan-hydrochlorothiazide (DIOVAN-HCT) 320-25 MG tablet, TAKE 1 TABLET BY MOUTH EVERY DAY( IN PLACE OF LISINOPRIL/HCTZ), Disp: 90 tablet, Rfl: 0 .  Vitamin D, Ergocalciferol, (DRISDOL) 1.25 MG (50000 UNIT) CAPS capsule, TAKE ONE CAPSULE BY MOUTH ONCE A WEEK FOR 12 WEEKS, Disp: 12 capsule, Rfl: 0 .  aspirin 81 MG tablet, Take by mouth. (Patient not taking: Reported on 10/11/2020), Disp: , Rfl:  .  ofloxacin (OCUFLOX) 0.3 % ophthalmic solution, , Disp: , Rfl:   No Known Allergies  I personally reviewed active problem list, medication list, allergies, family history, social history, health maintenance with the patient/caregiver today.   ROS  Constitutional: Negative for fever or weight change.  Respiratory: Negative for cough and shortness of breath.   Cardiovascular: Negative for chest pain or palpitations.  Gastrointestinal: Negative for abdominal pain, no bowel changes.  Musculoskeletal: Negative for gait problem or joint swelling.  Skin: Negative for rash.  Neurological: Negative for dizziness or headache.  No other specific complaints in a complete review of systems (except as listed in HPI above).  Objective  Virtual encounter, vitals not obtained.  Body mass index is 29.01 kg/m.  Physical Exam  Awake, alert and oriented   PHQ2/9: Depression screen [facility] 2/9 10/11/2020 02/27/2020 11/26/2019 07/02/2019 03/19/2019  Decreased Interest 0 0 0 0 0  Down, Depressed, Hopeless 0 0 0 0 0  PHQ - 2 Score 0 0 0 0 0  Altered sleeping - 0 0 0 0  Tired, decreased energy - 0 0 0 0  Change in appetite - 0 0 0 0  Feeling bad or failure about yourself  - 0 0 0 0   Trouble concentrating - 0 0 0 0  Moving slowly or fidgety/restless - 0 0 0 0  Suicidal thoughts - 0 0 0 0  PHQ-9 Score - 0 0 0 0  Difficult doing work/chores - Not difficult at all - Not difficult at all -   PHQ-2/9 Result is negative.    Fall Risk: Fall Risk  10/11/2020 02/27/2020 11/26/2019 07/02/2019 03/19/2019  Falls in the past year? 0 0 0 0 0  Number falls in past yr: 0 0 0 0 0  Injury with Fall? 0 0 0 0 0     Assessment & Plan   1. Type 2 diabetes with complication (HCC)  - empagliflozin (JARDIANCE) 25 MG TABS tablet; Take 1 tablet (25 mg total) by mouth daily.  Dispense: 90 tablet; Refill: 0 - glipiZIDE (GLUCOTROL XL) 10 MG 24 hr tablet; Take 1  tablet (10 mg total) by mouth daily with breakfast.  Dispense: 90 tablet; Refill: 0  2. Dyslipidemia associated with type 2 diabetes mellitus (HCC)  - empagliflozin (JARDIANCE) 25 MG TABS tablet; Take 1 tablet (25 mg total) by mouth daily.  Dispense: 90 tablet; Refill: 0 - glipiZIDE (GLUCOTROL XL) 10 MG 24 hr tablet; Take 1 tablet (10 mg total) by mouth daily with breakfast.  Dispense: 90 tablet; Refill: 0  3. Type 2 diabetes mellitus with obesity (HCC)  - empagliflozin (JARDIANCE) 25 MG TABS tablet; Take 1 tablet (25 mg total) by mouth daily.  Dispense: 90 tablet; Refill: 0 - glipiZIDE (GLUCOTROL XL) 10 MG 24 hr tablet; Take 1 tablet (10 mg total) by mouth daily with breakfast.  Dispense: 90 tablet; Refill: 0  4. Essential hypertension  - valsartan-hydrochlorothiazide (DIOVAN-HCT) 320-25 MG tablet; Take 1 tablet by mouth daily.  Dispense: 90 tablet; Refill: 0  5. Vitamin D deficiency   6. Secondary renal hyperparathyroidism ([place])   7. Type 2 diabetes mellitus with chronic kidney disease and hypertension ([place])   8. Dyslipidemia   9. Diabetic foot infection ([place])  Last visit was about one month ago with ID, she advised referral to dietician and better glucose control. Patient refused referral to dietician and or adding  medication to his current regiment, he states he will try to exercise more  I discussed the assessment and treatment plan with the patient. The patient was provided an opportunity to ask questions and all were answered. The patient agreed with the plan and demonstrated an understanding of the instructions.  The patient was advised to call back or seek an in-person evaluation if the symptoms worsen or if the condition fails to improve as anticipated.  I provided 25 minutes of non-face-to-face time during this encounter.

## 2020-11-02 IMAGING — DX DG FOOT COMPLETE 3+V*L*
4 series · 4 of 4 positions shown · non-contrast
Comparison: None.

CLINICAL DATA: Pain, diabetic foot ulcer

EXAM:
LEFT FOOT - COMPLETE 3+ VIEW

[foot ap]
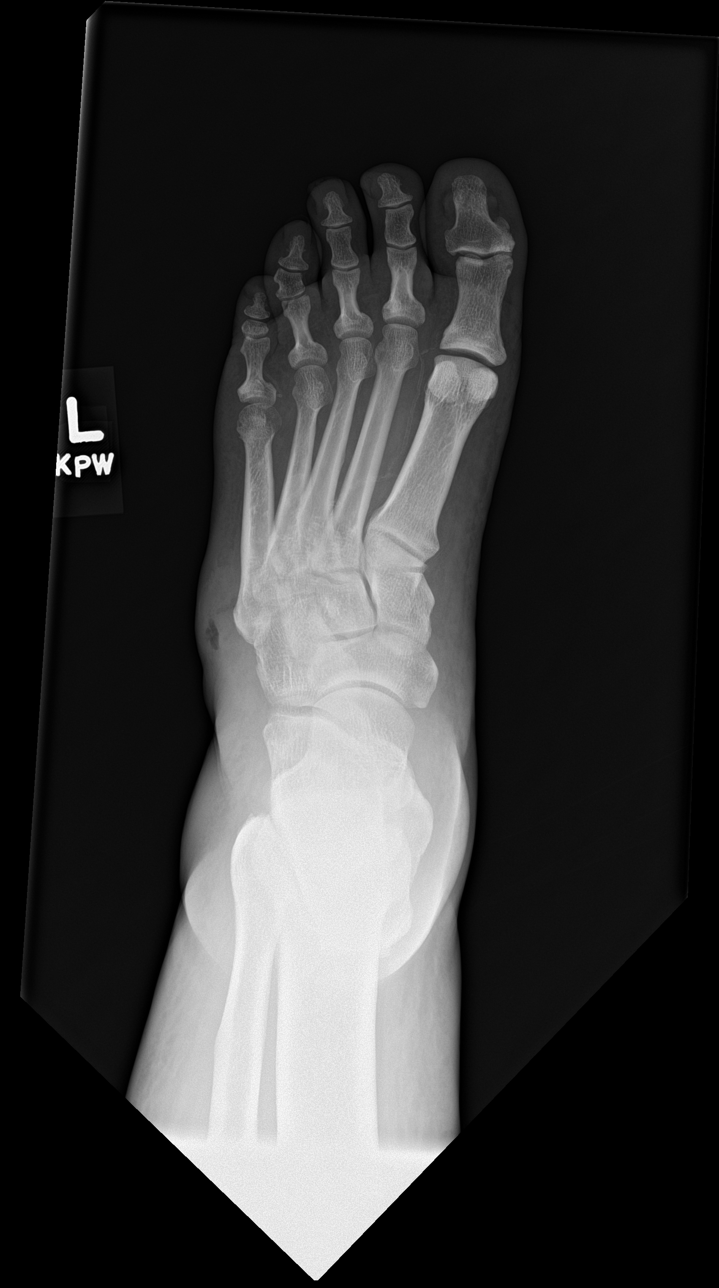

[foot obl]
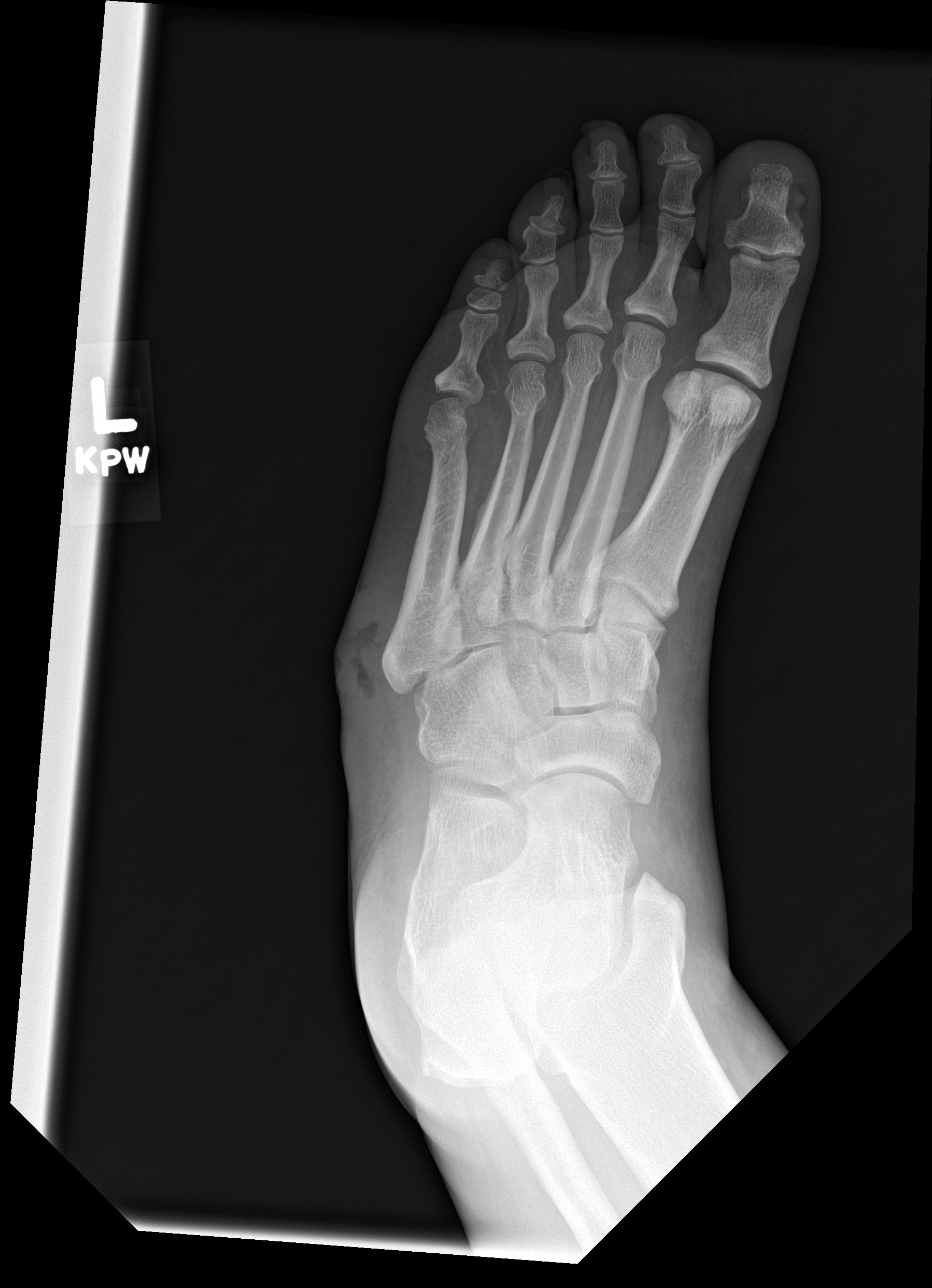

[foot lat (1 of 2)]
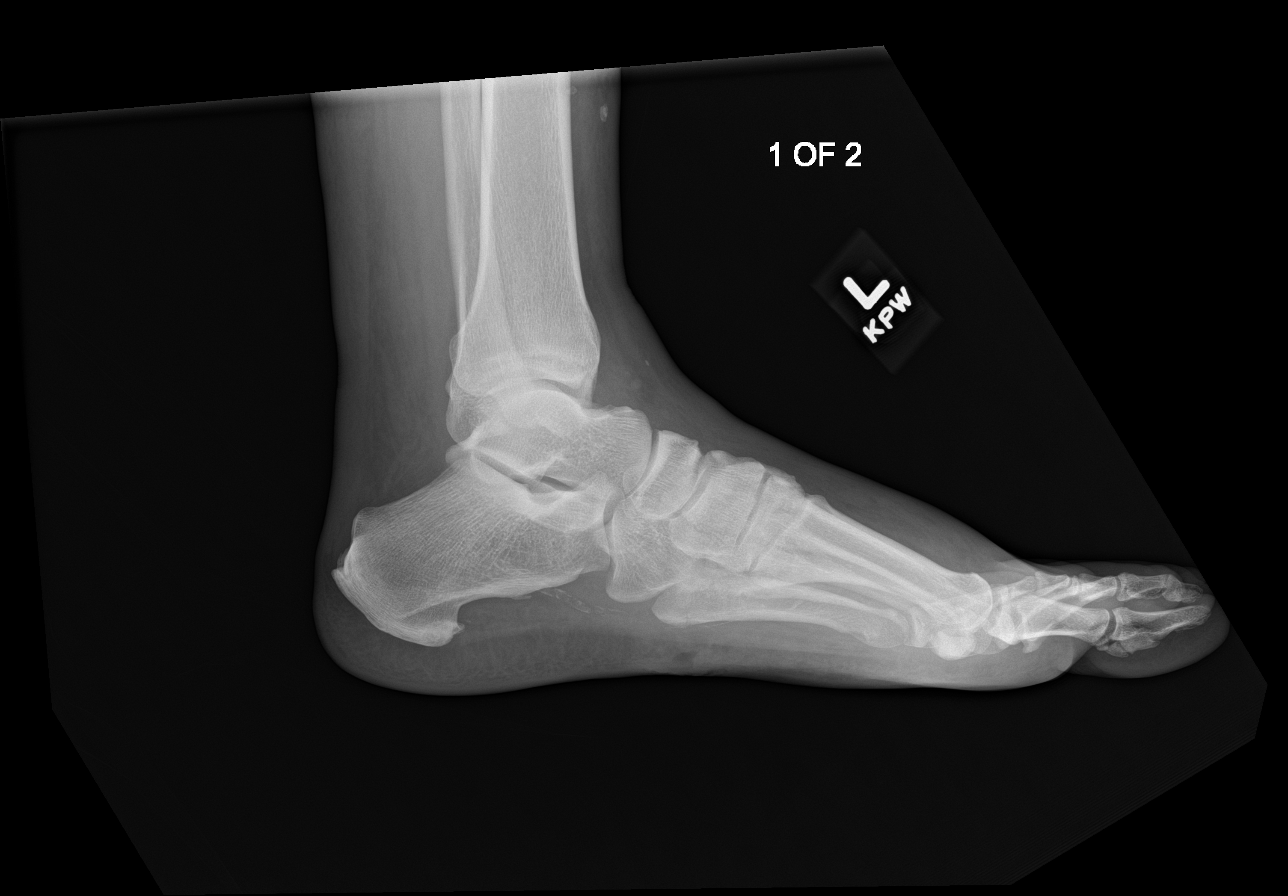

[foot lat (2 of 2)]
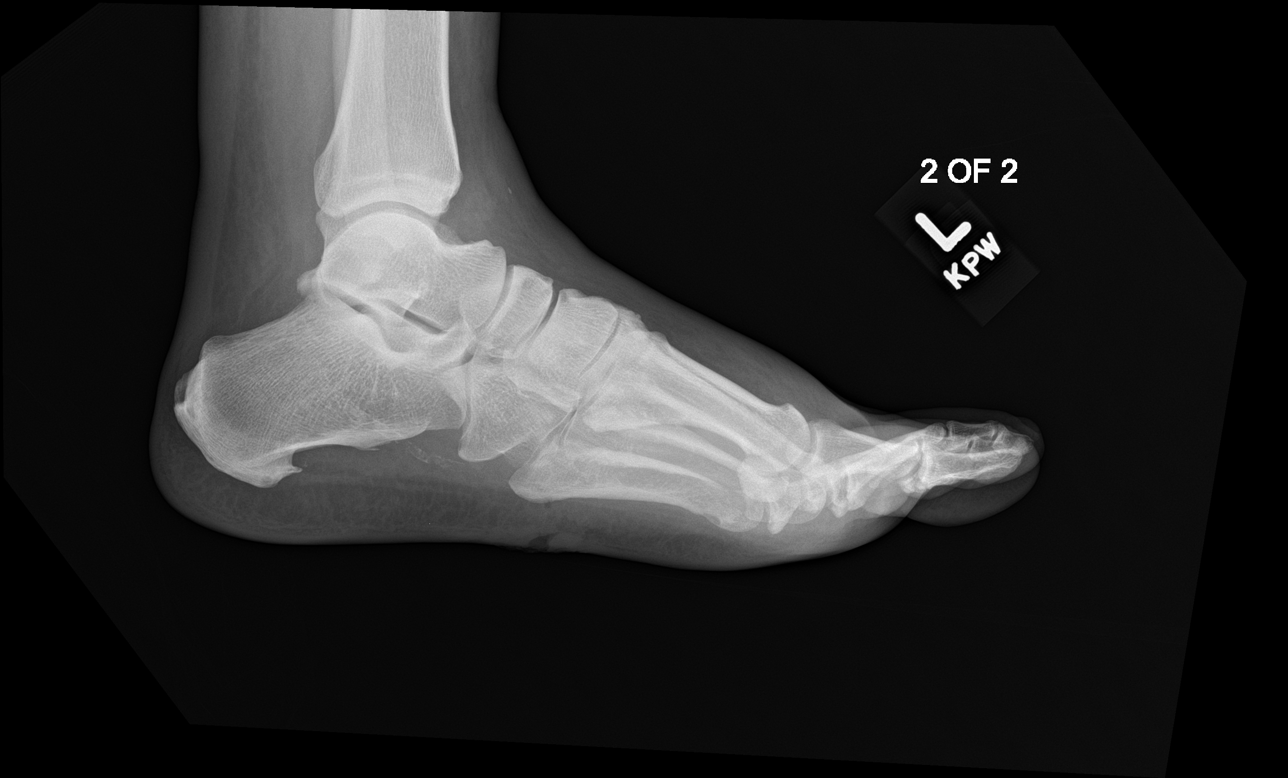

[4 of 4 positions shown; findings below may reference images not displayed]

FINDINGS: There is no evidence of fracture or dislocation. No areas of
cortical destruction or periosteal reaction. There is a focal area
of ulceration with subcutaneous emphysema and soft tissue swelling
seen over the lateral base of the fifth metatarsal.
IMPRESSION: Area of ulceration overlying the fifth metatarsal base. No definite
evidence of osteomyelitis.

## 2020-12-22 ENCOUNTER — Ambulatory Visit: Payer: Managed Care, Other (non HMO) | Admitting: Family Medicine

## 2020-12-28 ENCOUNTER — Other Ambulatory Visit: Payer: Self-pay

## 2020-12-28 ENCOUNTER — Telehealth: Payer: Self-pay | Admitting: Family Medicine

## 2020-12-28 DIAGNOSIS — E559 Vitamin D deficiency, unspecified: Secondary | ICD-10-CM

## 2020-12-28 MED ORDER — VITAMIN D (ERGOCALCIFEROL) 1.25 MG (50000 UNIT) PO CAPS: 50000.0000 [IU] | ORAL_CAPSULE | ORAL | 0 refills | Status: DC

## 2020-12-28 NOTE — Telephone Encounter (Signed)
Requested medication (s) are due for refill today: Yes  Requested medication (s) are on the active medication list: Yes  Last refill:  10/01/20  Future visit scheduled: Yes  Notes to clinic:  Unable to refill per protocol, cannot delegate.      Requested Prescriptions  Pending Prescriptions Disp Refills   Vitamin D, Ergocalciferol, (DRISDOL) 1.25 MG (50000 UNIT) CAPS capsule [Pharmacy Med Name: VITAMIN D2 50,000IU (ERGO) CAP RX] 12 capsule 0    Sig: TAKE ONE CAPSULE BY MOUTH ONCE A WEEK FOR 12 WEEKS      Endocrinology:  Vitamins - Vitamin D Supplementation Failed - 12/28/2020 11:31 AM      Failed - 50,000 IU strengths are not delegated      Failed - Phosphate in normal range and within 360 days    No results found for: PHOS        Failed - Vitamin D in normal range and within 360 days    Vit D, 25-Hydroxy  Date Value Ref Range Status  07/02/2019 32 30 - 100 ng/mL Final    Comment:    Vitamin D Status         25-OH Vitamin D: . Deficiency:                    <20 ng/mL Insufficiency:             20 - 29 ng/mL Optimal:                 > or = 30 ng/mL . For 25-OH Vitamin D testing on patients on  D2-supplementation and patients for whom quantitation  of D2 and D3 fractions is required, the Quest[employer]ureD(TM) 25-OH VIT D, (D2,D3), LC/MS/MS is recommended: order  code [phone] (patients >1yrs). See Note 1 . Note 1 . For additional information, please refer to  http://education.QuestDiagnostics.com/faq/FAQ199  (This link is being provided for informational/ educational purposes only.)           Passed - Ca in normal range and within 360 days    Calcium  Date Value Ref Range Status  06/29/2020 9.1 8.9 - 10.3 mg/dL Final          Passed - Valid encounter within last 12 months    Recent Outpatient Visits           2 months ago Essential hypertension   [facility] [place], Drue Stager, MD   10 months ago Type 2 diabetes mellitus with chronic kidney  disease and hypertension [facility])   [facility] Lebron Conners D, MD   1 year ago Dyslipidemia associated with type 2 diabetes mellitus [facility])   [facility] [name], MD   1 year ago Vitamin D deficiency   [facility] [place], Drue Stager, MD   1 year ago Dyslipidemia associated with type 2 diabetes mellitus [facility])   [facility] [name], MD       Future Appointments             In 1 month Ancil Boozer, Drue Stager, MD [facility]

## 2020-12-29 ENCOUNTER — Other Ambulatory Visit: Payer: Self-pay

## 2020-12-29 DIAGNOSIS — E559 Vitamin D deficiency, unspecified: Secondary | ICD-10-CM

## 2020-12-29 MED ORDER — VITAMIN D (ERGOCALCIFEROL) 1.25 MG (50000 UNIT) PO CAPS: 50000.0000 [IU] | ORAL_CAPSULE | ORAL | 0 refills | Status: DC

## 2021-01-10 ENCOUNTER — Telehealth: Payer: Self-pay | Admitting: Family Medicine

## 2021-01-10 DIAGNOSIS — E118 Type 2 diabetes mellitus with unspecified complications: Secondary | ICD-10-CM

## 2021-01-10 DIAGNOSIS — E1169 Type 2 diabetes mellitus with other specified complication: Secondary | ICD-10-CM

## 2021-01-10 DIAGNOSIS — I10 Essential (primary) hypertension: Secondary | ICD-10-CM

## 2021-01-10 DIAGNOSIS — E669 Obesity, unspecified: Secondary | ICD-10-CM

## 2021-02-08 NOTE — Progress Notes (Addendum)
Name: [name]   MRN: [id number]    DOB: [birth year]   Date:02/09/2021       Progress Note  Subjective  Chief Complaint  Follow up   HPI   DMII: he lost to follow up again, last visit last Summer. He has been out of medications. He does not check his glucose at home. Refuses to use injectable medications.  He has a history of osteomyelitis, used to see Dr. Luana Shu but states no longer having problems. Last visit with  Dr. Candiss Norse was Summer 2021 , explained importance of follow up. BP is not at goal today. He has  CKI/HTN/DM and secondary hyperparathyroidism. Marland Kitchen He denies polyphagia, polydipsia or polyuria He states he has been taking medications as prescribed, but based on his medication refills he has not been very compliant   Macro albuminuria with secondary hyperparathyroidism: we will recheck labs today   HTN:He is on Norvasc and valsartan hctz , bp is not at goal, we will add bystolic today   Dyslipidemia: heis on Crestor, currently taking it daily, no side effects we will recheck labs    Patient Active Problem List   Diagnosis Date Noted  . Localized edema 10/14/2019  . Proteinuria 10/14/2019  . Secondary renal hyperparathyroidism ([place]) 03/19/2019  . Type 2 diabetes mellitus with left diabetic foot ulcer ([place]) 12/18/2018  . Type 2 diabetes mellitus with chronic kidney disease and hypertension ([place]) 12/18/2018  . Type 2 diabetes mellitus with hyperlipidemia ([place]) 12/18/2018  . Dyslipidemia associated with type 2 diabetes mellitus ([place]) 07/03/2018  . Vitamin D deficiency 04/11/2018  . ED (erectile dysfunction) 04/10/2018  . Lower urinary tract symptoms (LUTS) 02/09/2017  . Hypertension 07/05/2015  . Diabetes mellitus type 2, uncontrolled ([place]) 07/05/2015  . HLD (hyperlipidemia) 07/05/2015     Past Surgical History:  Procedure Laterality Date  . BONE BIOPSY Left 06/27/2020   Procedure: BONE BIOPSY;  Surgeon: Caroline More, DPM;  Location: [facility] ORS;  Service: Podiatry;   Laterality: Left;  . INCISION AND DRAINAGE Left 06/27/2020   Procedure: INCISION AND DRAINAGE;  Surgeon: Caroline More, DPM;  Location: [facility] ORS;  Service: Podiatry;  Laterality: Left;  . LOWER EXTREMITY ANGIOGRAPHY Left 06/30/2020   Procedure: Lower Extremity Angiography;  Surgeon: Algernon Huxley, MD;  Location: [place] [facility];  Service: Cardiovascular;  Laterality: Left;  . NO PAST SURGERIES      Family History  Problem Relation Age of Onset  . Heart disease Father   . Heart attack Father   . Healthy Mother   . Prostate cancer Neg Hx   . Kidney cancer Neg Hx   . Bladder Cancer Neg Hx     Social History   Tobacco Use  . Smoking status: Never Smoker  . Smokeless tobacco: Never Used  Substance Use Topics  . Alcohol use: Yes    Alcohol/week: 6.0 - 12.0 standard drinks    Types: 6 - 12 Cans of beer per week    Comment: Occasional     Current Outpatient Medications:  .  nebivolol (BYSTOLIC) 5 MG tablet, Take 1 tablet (5 mg total) by mouth daily., Disp: 90 tablet, Rfl: 0 .  pioglitazone (ACTOS) 15 MG tablet, Take 1 tablet (15 mg total) by mouth daily., Disp: 90 tablet, Rfl: 0 .  torsemide (DEMADEX) 10 MG tablet, Take 1 tablet by mouth daily as needed., Disp: , Rfl:  .  Vitamin D, Ergocalciferol, (DRISDOL) 1.25 MG (50000 UNIT) CAPS capsule, Take 1 capsule (50,000 Units total)  by mouth every 7 (seven) days., Disp: 4 capsule, Rfl: 0 .  amLODipine (NORVASC) 10 MG tablet, Take 1 tablet (10 mg total) by mouth daily., Disp: 90 tablet, Rfl: 0 .  empagliflozin (JARDIANCE) 25 MG TABS tablet, Take 1 tablet (25 mg total) by mouth daily., Disp: 90 tablet, Rfl: 0 .  glipiZIDE (GLUCOTROL XL) 10 MG 24 hr tablet, Take 1 tablet (10 mg total) by mouth daily with breakfast., Disp: 90 tablet, Rfl: 0 .  rosuvastatin (CRESTOR) 40 MG tablet, Take 1 tablet (40 mg total) by mouth daily., Disp: 90 tablet, Rfl: 0 .  valsartan-hydrochlorothiazide (DIOVAN-HCT) 320-25 MG tablet, Take 1 tablet by mouth daily.,  Disp: 90 tablet, Rfl: 0  No Known Allergies  I personally reviewed active problem list, medication list, allergies, family history, social history, health maintenance with the patient/caregiver today.   ROS  Constitutional: Negative for fever or weight change.  Respiratory: Negative for cough and shortness of breath.   Cardiovascular: Negative for chest pain or palpitations.  Gastrointestinal: Negative for abdominal pain, no bowel changes.  Musculoskeletal: Negative for gait problem or joint swelling.  Skin: Negative for rash.  Neurological: Negative for dizziness or headache.  No other specific complaints in a complete review of systems (except as listed in HPI above).  Objective  Vitals:   02/09/21 1042 02/09/21 1056  BP: (!) 146/100 (!) 142/94  Pulse: 71   Resp: 16   Temp: 97.7 F (36.5 C)   TempSrc: Oral   SpO2: 98%   Weight: 211 lb 8 oz (95.9 kg)   Height: 5\' 11"  (1.803 m)     Body mass index is 29.5 kg/m.  Physical Exam  Constitutional: Patient appears well-developed and well-nourished. Overweight. No distress.  HEENT: head atraumatic, normocephalic, pupils equal and reactive to light, neck supple Cardiovascular: Normal rate, regular rhythm and normal heart sounds.  No murmur heard. No BLE edema. Pulmonary/Chest: Effort normal and breath sounds normal. No respiratory distress. Abdominal: Soft.  There is no tenderness. Psychiatric: Patient has a normal mood and affect. behavior is normal. Judgment and thought content normal.  Recent Results (from the past 2160 hour(s))  POCT HgB A1C     Status: Abnormal   Collection Time: 02/09/21 10:56 AM  Result Value Ref Range   Hemoglobin A1C 11.7 (A) 4.0 - 5.6 %   HbA1c POC (<> result, manual entry)     HbA1c, POC (prediabetic range)     HbA1c, POC (controlled diabetic range)      Diabetic Foot Exam: Diabetic Foot Exam - Simple   Simple Foot Form Diabetic Foot exam was performed with the following findings: Yes  02/09/2021 11:30 AM  Visual Inspection See comments: Yes Sensation Testing Intact to touch and monofilament testing bilaterally: Yes Pulse Check Posterior Tibialis and Dorsalis pulse intact bilaterally: Yes Comments Thick toenails, dry feet       PHQ2/9: Depression screen [facility] 2/9 02/09/2021 10/11/2020 02/27/2020 11/26/2019 07/02/2019  Decreased Interest 0 0 0 0 0  Down, Depressed, Hopeless 0 0 0 0 0  PHQ - 2 Score 0 0 0 0 0  Altered sleeping - - 0 0 0  Tired, decreased energy - - 0 0 0  Change in appetite - - 0 0 0  Feeling bad or failure about yourself  - - 0 0 0  Trouble concentrating - - 0 0 0  Moving slowly or fidgety/restless - - 0 0 0  Suicidal thoughts - - 0 0 0  PHQ-9 Score - -  0 0 0  Difficult doing work/chores - - Not difficult at all - Not difficult at all    phq 9 is negative   Fall Risk: Fall Risk  02/09/2021 10/11/2020 02/27/2020 11/26/2019 07/02/2019  Falls in the past year? 0 0 0 0 0  Number falls in past yr: 0 0 0 0 0  Injury with Fall? 0 0 0 0 0     Functional Status Survey: Is the patient deaf or have difficulty hearing?: No Does the patient have difficulty seeing, even when wearing glasses/contacts?: No Does the patient have difficulty concentrating, remembering, or making decisions?: No Does the patient have difficulty walking or climbing stairs?: No Does the patient have difficulty dressing or bathing?: No Does the patient have difficulty doing errands alone such as visiting a doctor's office or shopping?: No    Assessment & Plan   1. Type 2 diabetes with complication (HCC)  - POCT HgB A1C - glipiZIDE (GLUCOTROL XL) 10 MG 24 hr tablet; Take 1 tablet (10 mg total) by mouth daily with breakfast.  Dispense: 90 tablet; Refill: 0 - Microalbumin / creatinine urine ratio - empagliflozin (JARDIANCE) 25 MG TABS tablet; Take 1 tablet (25 mg total) by mouth daily.  Dispense: 90 tablet; Refill: 0  2. Essential hypertension  - amLODipine (NORVASC) 10 MG  tablet; Take 1 tablet (10 mg total) by mouth daily.  Dispense: 90 tablet; Refill: 0 - valsartan-hydrochlorothiazide (DIOVAN-HCT) 320-25 MG tablet; Take 1 tablet by mouth daily.  Dispense: 90 tablet; Refill: 0 - Microalbumin / creatinine urine ratio - COMPLETE METABOLIC PANEL WITH GFR - CBC with Differential/Platelet  3. Dyslipidemia associated with type 2 diabetes mellitus (HCC)  - rosuvastatin (CRESTOR) 40 MG tablet; Take 1 tablet (40 mg total) by mouth daily.  Dispense: 90 tablet; Refill: 0 - glipiZIDE (GLUCOTROL XL) 10 MG 24 hr tablet; Take 1 tablet (10 mg total) by mouth daily with breakfast.  Dispense: 90 tablet; Refill: 0 - pioglitazone (ACTOS) 15 MG tablet; Take 1 tablet (15 mg total) by mouth daily.  Dispense: 90 tablet; Refill: 0 - Lipid panel - empagliflozin (JARDIANCE) 25 MG TABS tablet; Take 1 tablet (25 mg total) by mouth daily.  Dispense: 90 tablet; Refill: 0  4. Type 2 diabetes mellitus with obesity (HCC)  - glipiZIDE (GLUCOTROL XL) 10 MG 24 hr tablet; Take 1 tablet (10 mg total) by mouth daily with breakfast.  Dispense: 90 tablet; Refill: 0 - empagliflozin (JARDIANCE) 25 MG TABS tablet; Take 1 tablet (25 mg total) by mouth daily.  Dispense: 90 tablet; Refill: 0  5. Non-compliance  He refuses to start injectable medications, he also refuses to see endo, explained that if he is unable to bring A1C towards 8 and refuses to see Endo we will have to dismiss him from our practice. Patient verbalized understanding   6. Vitamin D deficiency  Discussed otc vitamin D supplementation   7. Secondary renal hyperparathyroidism ([place])  Needs to follow up with nephrologist   8. Colon cancer screening  - Cologuard

## 2021-02-09 ENCOUNTER — Other Ambulatory Visit: Payer: Self-pay

## 2021-02-09 ENCOUNTER — Encounter: Payer: Self-pay | Admitting: Family Medicine

## 2021-02-09 ENCOUNTER — Ambulatory Visit: Payer: Managed Care, Other (non HMO) | Admitting: Family Medicine

## 2021-02-09 VITALS — BP 142/94 | HR 71 | Temp 97.7°F | Resp 16 | Ht 71.0 in | Wt 211.5 lb

## 2021-02-09 DIAGNOSIS — E118 Type 2 diabetes mellitus with unspecified complications: Secondary | ICD-10-CM | POA: Diagnosis not present

## 2021-02-09 DIAGNOSIS — E785 Hyperlipidemia, unspecified: Secondary | ICD-10-CM

## 2021-02-09 DIAGNOSIS — Z9119 Patient's noncompliance with other medical treatment and regimen: Secondary | ICD-10-CM

## 2021-02-09 DIAGNOSIS — E1169 Type 2 diabetes mellitus with other specified complication: Secondary | ICD-10-CM | POA: Diagnosis not present

## 2021-02-09 DIAGNOSIS — I10 Essential (primary) hypertension: Secondary | ICD-10-CM

## 2021-02-09 DIAGNOSIS — Z91199 Patient's noncompliance with other medical treatment and regimen due to unspecified reason: Secondary | ICD-10-CM

## 2021-02-09 DIAGNOSIS — E559 Vitamin D deficiency, unspecified: Secondary | ICD-10-CM

## 2021-02-09 DIAGNOSIS — N2581 Secondary hyperparathyroidism of renal origin: Secondary | ICD-10-CM

## 2021-02-09 DIAGNOSIS — E669 Obesity, unspecified: Secondary | ICD-10-CM

## 2021-02-09 DIAGNOSIS — Z1211 Encounter for screening for malignant neoplasm of colon: Secondary | ICD-10-CM

## 2021-02-09 LAB — POCT GLYCOSYLATED HEMOGLOBIN (HGB A1C): Hemoglobin A1C: 11.7 % — AB (ref 4.0–5.6)

## 2021-02-09 MED ORDER — PIOGLITAZONE HCL 15 MG PO TABS: 15.0000 mg | ORAL_TABLET | Freq: Every day | ORAL | 0 refills | Status: DC

## 2021-02-09 MED ORDER — EMPAGLIFLOZIN 25 MG PO TABS: 25.0000 mg | ORAL_TABLET | Freq: Every day | ORAL | 0 refills | Status: DC

## 2021-02-09 MED ORDER — GLIPIZIDE ER 10 MG PO TB24: 10.0000 mg | ORAL_TABLET | Freq: Every day | ORAL | 0 refills | Status: DC

## 2021-02-09 MED ORDER — ROSUVASTATIN CALCIUM 40 MG PO TABS: 40.0000 mg | ORAL_TABLET | Freq: Every day | ORAL | 0 refills | Status: DC

## 2021-02-09 MED ORDER — AMLODIPINE BESYLATE 10 MG PO TABS
10.0000 mg | ORAL_TABLET | Freq: Every day | ORAL | 0 refills | Status: DC
Start: 2021-02-09 — End: 2021-04-05

## 2021-02-09 MED ORDER — VALSARTAN-HYDROCHLOROTHIAZIDE 320-25 MG PO TABS: 1.0000 | ORAL_TABLET | Freq: Every day | ORAL | 0 refills | Status: DC

## 2021-02-09 MED ORDER — NEBIVOLOL HCL 5 MG PO TABS: 5.0000 mg | ORAL_TABLET | Freq: Every day | ORAL | 0 refills | Status: DC

## 2021-02-10 LAB — CBC WITH DIFFERENTIAL/PLATELET
Absolute Monocytes: 354 cells/uL (ref 200–950)
Basophils Absolute: 62 cells/uL (ref 0–200)
Basophils Relative: 1.2 %
Eosinophils Absolute: 99 cells/uL (ref 15–500)
Eosinophils Relative: 1.9 %
HCT: 43 % (ref 38.5–50.0)
Hemoglobin: 14.7 g/dL (ref 13.2–17.1)
Lymphs Abs: 1149 cells/uL (ref 850–3900)
MCH: 32 pg (ref 27.0–33.0)
MCHC: 34.2 g/dL (ref 32.0–36.0)
MCV: 93.5 fL (ref 80.0–100.0)
MPV: 10 fL (ref 7.5–12.5)
Monocytes Relative: 6.8 %
Neutro Abs: 3536 cells/uL (ref 1500–7800)
Neutrophils Relative %: 68 %
Platelets: 292 10*3/uL (ref 140–400)
RBC: 4.6 10*6/uL (ref 4.20–5.80)
RDW: 11.8 % (ref 11.0–15.0)
Total Lymphocyte: 22.1 %
WBC: 5.2 10*3/uL (ref 3.8–10.8)

## 2021-02-10 LAB — COMPLETE METABOLIC PANEL WITH GFR
AG Ratio: 1.6 (calc) (ref 1.0–2.5)
ALT: 19 U/L (ref 9–46)
AST: 14 U/L (ref 10–35)
Albumin: 4.1 g/dL (ref 3.6–5.1)
Alkaline phosphatase (APISO): 77 U/L (ref 35–144)
BUN/Creatinine Ratio: 31 (calc) — ABNORMAL HIGH (ref 6–22)
BUN: 32 mg/dL — ABNORMAL HIGH (ref 7–25)
CO2: 28 mmol/L (ref 20–32)
Calcium: 10 mg/dL (ref 8.6–10.3)
Chloride: 96 mmol/L — ABNORMAL LOW (ref 98–110)
Creat: 1.02 mg/dL (ref 0.70–1.33)
GFR, Est African American: 96 mL/min/{1.73_m2} (ref >=60)
GFR, Est Non African American: 83 mL/min/{1.73_m2} (ref >=60)
Globulin: 2.6 g/dL (calc) (ref 1.9–3.7)
Glucose, Bld: 258 mg/dL — ABNORMAL HIGH (ref 65–99)
Potassium: 5.2 mmol/L (ref 3.5–5.3)
Sodium: 134 mmol/L — ABNORMAL LOW (ref 135–146)
Total Bilirubin: 1.5 mg/dL — ABNORMAL HIGH (ref 0.2–1.2)
Total Protein: 6.7 g/dL (ref 6.1–8.1)

## 2021-02-10 LAB — MICROALBUMIN / CREATININE URINE RATIO
Creatinine, Urine: 18 mg/dL — ABNORMAL LOW (ref 20–320)
Microalb Creat Ratio: 278 mcg/mg creat — ABNORMAL HIGH (ref <30)
Microalb, Ur: 5 mg/dL

## 2021-02-10 LAB — LIPID PANEL
Cholesterol: 253 mg/dL — ABNORMAL HIGH (ref <200)
HDL: 62 mg/dL (ref >=40)
LDL Cholesterol (Calc): 162 mg/dL (calc) — ABNORMAL HIGH
Non-HDL Cholesterol (Calc): 191 mg/dL (calc) — ABNORMAL HIGH (ref <130)
Total CHOL/HDL Ratio: 4.1 (calc) (ref <5.0)
Triglycerides: 143 mg/dL (ref <150)

## 2021-04-05 ENCOUNTER — Ambulatory Visit: Payer: Managed Care, Other (non HMO) | Admitting: Podiatry

## 2021-04-05 ENCOUNTER — Ambulatory Visit (INDEPENDENT_AMBULATORY_CARE_PROVIDER_SITE_OTHER): Payer: Managed Care, Other (non HMO)

## 2021-04-05 ENCOUNTER — Encounter: Payer: Self-pay | Admitting: Podiatry

## 2021-04-05 ENCOUNTER — Other Ambulatory Visit: Payer: Self-pay

## 2021-04-05 DIAGNOSIS — L97512 Non-pressure chronic ulcer of other part of right foot with fat layer exposed: Secondary | ICD-10-CM | POA: Diagnosis not present

## 2021-04-05 DIAGNOSIS — L97511 Non-pressure chronic ulcer of other part of right foot limited to breakdown of skin: Secondary | ICD-10-CM | POA: Diagnosis not present

## 2021-04-05 DIAGNOSIS — I999 Unspecified disorder of circulatory system: Secondary | ICD-10-CM | POA: Diagnosis not present

## 2021-04-05 DIAGNOSIS — E1169 Type 2 diabetes mellitus with other specified complication: Secondary | ICD-10-CM

## 2021-04-05 DIAGNOSIS — E08621 Diabetes mellitus due to underlying condition with foot ulcer: Secondary | ICD-10-CM

## 2021-04-05 DIAGNOSIS — E785 Hyperlipidemia, unspecified: Secondary | ICD-10-CM

## 2021-04-05 MED ORDER — DOXYCYCLINE HYCLATE 100 MG PO TABS: 100.0000 mg | ORAL_TABLET | Freq: Two times a day (BID) | ORAL | 0 refills | Status: DC

## 2021-04-05 NOTE — Progress Notes (Signed)
Subjective:  Patient ID: [name], male    DOB: 04-02-1966,  MRN: [id number]  Chief Complaint  Patient presents with  . Foot Ulcer    Patient presents today for wound on right base of 5th met x 1 week.  He says it started as a callus but then he peeled the dry skin away and it formed a wound.  He also says his foot is painful in the center arch when he steps down to walk    55 y.o. male presents for wound care.  Patient presents with a new complaint of wound to the right submetatarsal 5.  Patient was seen by Dr. Prudence Davidson long times ago for ulceration.  He states that he had surgery done on the left foot for something similar.  The wound has been going for 1 week has progressive gotten worse.  It hurts with walking down on it.  No purulent drainage noted.  He denies any other acute complaints.  He has not been taking any antibiotics.  He has not seen anyone else prior to see me for this.   Review of Systems: Negative except as noted in the HPI. Denies N/V/F/Ch.  Past Medical History:  Diagnosis Date  . Diabetes mellitus without complication ([place])   . Hyperlipidemia   . Hypertension     Current Outpatient Medications:  .  empagliflozin (JARDIANCE) 25 MG TABS tablet, Take 1 tablet (25 mg total) by mouth daily., Disp: 90 tablet, Rfl: 0 .  glipiZIDE (GLUCOTROL XL) 10 MG 24 hr tablet, Take 1 tablet (10 mg total) by mouth daily with breakfast., Disp: 90 tablet, Rfl: 0 .  nebivolol (BYSTOLIC) 5 MG tablet, Take 1 tablet (5 mg total) by mouth daily., Disp: 90 tablet, Rfl: 0 .  pioglitazone (ACTOS) 15 MG tablet, Take 1 tablet (15 mg total) by mouth daily., Disp: 90 tablet, Rfl: 0 .  rosuvastatin (CRESTOR) 40 MG tablet, Take 1 tablet (40 mg total) by mouth daily., Disp: 90 tablet, Rfl: 0 .  torsemide (DEMADEX) 10 MG tablet, Take 1 tablet by mouth daily as needed., Disp: , Rfl:  .  valsartan-hydrochlorothiazide (DIOVAN-HCT) 320-25 MG tablet, Take 1 tablet by mouth daily., Disp: 90 tablet, Rfl: 0 .   Vitamin D, Ergocalciferol, (DRISDOL) 1.25 MG (50000 UNIT) CAPS capsule, Take 1 capsule (50,000 Units total) by mouth every 7 (seven) days., Disp: 4 capsule, Rfl: 0  Social History   Tobacco Use  Smoking Status Never Smoker  Smokeless Tobacco Never Used    No Known Allergies Objective:  There were no vitals filed for this visit. There is no height or weight on file to calculate BMI. Constitutional Well developed. Well nourished.  Vascular Dorsalis pedis pulses nonpalpable bilaterally. Posterior tibial pulses nonpalpable bilaterally. Capillary refill normal to all digits.  No cyanosis or clubbing noted. Pedal hair growth normal.  Neurologic Normal speech. Oriented to person, place, and time. Protective sensation absent  Dermatologic Wound Location: Right submetatarsal 5 ulceration with fat layer exposed.  Does not probe down to bone.  No purulent drainage noted no malodor present.  Erythema noted circumferentially around the wound. Wound Base: Mixed Granular/Fibrotic Peri-wound: Reddened Exudate: Scant/small amount Serosanguinous exudate Wound Measurements: -See below  Orthopedic: No pain to palpation either foot.   Radiographs: 3 views of skeletally mature the right foot: No cortical destruction or irregularity noted.  No signs of osteomyelitis noted.  No foreign body noted.  No gas emphysema noted Assessment:   1. Right foot ulcer, with fat layer  exposed ([place])   2. Vascular abnormality   3. Type 2 diabetes mellitus with hyperlipidemia ([place])    Plan:  Patient was evaluated and treated and all questions answered.  Ulcer right submetatarsal 5 ulceration with fat layer exposed -Debridement as below. -Dressed with Betadine wet-to-dry, DSD. -Continue off-loading with cam boot -If there is no improvement of the wound we will consider doing vascular work-up as patient may have component of peripheral vascular disease given his history -Doxycycline was dispensed for skin and  soft tissue prophylaxis.  Procedure: Excisional Debridement of Wound Tool: Sharp chisel blade/tissue nipper Rationale: Removal of non-viable soft tissue from the wound to promote healing.  Anesthesia: none Pre-Debridement Wound Measurements: 1.5 cm x 1.2 cm x 0.5 cm  Post-Debridement Wound Measurements: 1.7 cm x 1.3 cm x 0.5 cm  Type of Debridement: Sharp Excisional Tissue Removed: Non-viable soft tissue Blood loss: Minimal (<50cc) Depth of Debridement: subcutaneous tissue. Technique: Sharp excisional debridement to bleeding, viable wound base.  Wound Progress: This is my initial evaluation.  I will continue monitor the progression of it. Site healing conversation 7 Dressing: Dry, sterile, compression dressing. Disposition: Patient tolerated procedure well. Patient to return in 1 week for follow-up.  No follow-ups on file.

## 2021-04-05 NOTE — Addendum Note (Signed)
Addended by: Boneta Lucks on: 04/05/2021 03:38 PM   Modules accepted: Orders

## 2021-04-12 ENCOUNTER — Other Ambulatory Visit: Payer: Self-pay

## 2021-04-12 ENCOUNTER — Inpatient Hospital Stay
Admission: EM | Admit: 2021-04-12 | Discharge: 2021-04-22 | DRG: 240 | Disposition: A | Discharge status: Home Health | Payer: Managed Care, Other (non HMO) | Attending: Internal Medicine | Admitting: Internal Medicine

## 2021-04-12 ENCOUNTER — Emergency Department: Payer: Managed Care, Other (non HMO)

## 2021-04-12 DIAGNOSIS — L97519 Non-pressure chronic ulcer of other part of right foot with unspecified severity: Secondary | ICD-10-CM | POA: Diagnosis present

## 2021-04-12 DIAGNOSIS — E11628 Type 2 diabetes mellitus with other skin complications: Secondary | ICD-10-CM | POA: Diagnosis present

## 2021-04-12 DIAGNOSIS — D649 Anemia, unspecified: Secondary | ICD-10-CM

## 2021-04-12 DIAGNOSIS — E11621 Type 2 diabetes mellitus with foot ulcer: Secondary | ICD-10-CM | POA: Diagnosis present

## 2021-04-12 DIAGNOSIS — I96 Gangrene, not elsewhere classified: Secondary | ICD-10-CM | POA: Diagnosis not present

## 2021-04-12 DIAGNOSIS — L02611 Cutaneous abscess of right foot: Secondary | ICD-10-CM | POA: Diagnosis present

## 2021-04-12 DIAGNOSIS — E1169 Type 2 diabetes mellitus with other specified complication: Secondary | ICD-10-CM | POA: Diagnosis present

## 2021-04-12 DIAGNOSIS — E876 Hypokalemia: Secondary | ICD-10-CM | POA: Diagnosis not present

## 2021-04-12 DIAGNOSIS — L03115 Cellulitis of right lower limb: Secondary | ICD-10-CM

## 2021-04-12 DIAGNOSIS — N179 Acute kidney failure, unspecified: Secondary | ICD-10-CM

## 2021-04-12 DIAGNOSIS — I10 Essential (primary) hypertension: Secondary | ICD-10-CM | POA: Diagnosis present

## 2021-04-12 DIAGNOSIS — L97512 Non-pressure chronic ulcer of other part of right foot with fat layer exposed: Secondary | ICD-10-CM | POA: Diagnosis not present

## 2021-04-12 DIAGNOSIS — E1152 Type 2 diabetes mellitus with diabetic peripheral angiopathy with gangrene: Secondary | ICD-10-CM | POA: Diagnosis present

## 2021-04-12 DIAGNOSIS — E871 Hypo-osmolality and hyponatremia: Secondary | ICD-10-CM | POA: Diagnosis present

## 2021-04-12 DIAGNOSIS — M869 Osteomyelitis, unspecified: Secondary | ICD-10-CM | POA: Diagnosis not present

## 2021-04-12 DIAGNOSIS — Z8249 Family history of ischemic heart disease and other diseases of the circulatory system: Secondary | ICD-10-CM | POA: Diagnosis not present

## 2021-04-12 DIAGNOSIS — Z7984 Long term (current) use of oral hypoglycemic drugs: Secondary | ICD-10-CM | POA: Diagnosis not present

## 2021-04-12 DIAGNOSIS — Z20822 Contact with and (suspected) exposure to covid-19: Secondary | ICD-10-CM | POA: Diagnosis present

## 2021-04-12 DIAGNOSIS — I70268 Atherosclerosis of native arteries of extremities with gangrene, other extremity: Secondary | ICD-10-CM | POA: Diagnosis present

## 2021-04-12 DIAGNOSIS — M86171 Other acute osteomyelitis, right ankle and foot: Secondary | ICD-10-CM | POA: Diagnosis present

## 2021-04-12 DIAGNOSIS — L97509 Non-pressure chronic ulcer of other part of unspecified foot with unspecified severity: Secondary | ICD-10-CM | POA: Diagnosis not present

## 2021-04-12 DIAGNOSIS — E785 Hyperlipidemia, unspecified: Secondary | ICD-10-CM | POA: Diagnosis present

## 2021-04-12 DIAGNOSIS — Z79899 Other long term (current) drug therapy: Secondary | ICD-10-CM

## 2021-04-12 DIAGNOSIS — I739 Peripheral vascular disease, unspecified: Secondary | ICD-10-CM

## 2021-04-12 DIAGNOSIS — E1165 Type 2 diabetes mellitus with hyperglycemia: Secondary | ICD-10-CM

## 2021-04-12 DIAGNOSIS — L0390 Cellulitis, unspecified: Secondary | ICD-10-CM | POA: Diagnosis present

## 2021-04-12 DIAGNOSIS — Z794 Long term (current) use of insulin: Secondary | ICD-10-CM | POA: Diagnosis not present

## 2021-04-12 DIAGNOSIS — L97502 Non-pressure chronic ulcer of other part of unspecified foot with fat layer exposed: Secondary | ICD-10-CM

## 2021-04-12 LAB — CBC WITH DIFFERENTIAL/PLATELET
Abs Immature Granulocytes: 0.28 10*3/uL — ABNORMAL HIGH (ref 0.00–0.07)
Basophils Absolute: 0.1 10*3/uL (ref 0.0–0.1)
Basophils Relative: 0 %
Eosinophils Absolute: 0 10*3/uL (ref 0.0–0.5)
Eosinophils Relative: 0 %
HCT: 33.5 % — ABNORMAL LOW (ref 39.0–52.0)
Hemoglobin: 11.1 g/dL — ABNORMAL LOW (ref 13.0–17.0)
Immature Granulocytes: 1 %
Lymphocytes Relative: 2 %
Lymphs Abs: 0.6 10*3/uL — ABNORMAL LOW (ref 0.7–4.0)
MCH: 30.3 pg (ref 26.0–34.0)
MCHC: 33.1 g/dL (ref 30.0–36.0)
MCV: 91.5 fL (ref 80.0–100.0)
Monocytes Absolute: 2.1 10*3/uL — ABNORMAL HIGH (ref 0.1–1.0)
Monocytes Relative: 8 %
Neutro Abs: 23.7 10*3/uL — ABNORMAL HIGH (ref 1.7–7.7)
Neutrophils Relative %: 89 %
Platelets: 420 10*3/uL — ABNORMAL HIGH (ref 150–400)
RBC: 3.66 MIL/uL — ABNORMAL LOW (ref 4.22–5.81)
RDW: 11.2 % — ABNORMAL LOW (ref 11.5–15.5)
WBC: 26.8 10*3/uL — ABNORMAL HIGH (ref 4.0–10.5)
nRBC: 0 % (ref 0.0–0.2)

## 2021-04-12 LAB — COMPREHENSIVE METABOLIC PANEL
ALT: 11 U/L (ref 0–44)
AST: 12 U/L — ABNORMAL LOW (ref 15–41)
Albumin: 2.7 g/dL — ABNORMAL LOW (ref 3.5–5.0)
Alkaline Phosphatase: 86 U/L (ref 38–126)
Anion gap: 19 — ABNORMAL HIGH (ref 5–15)
BUN: 53 mg/dL — ABNORMAL HIGH (ref 6–20)
CO2: 18 mmol/L — ABNORMAL LOW (ref 22–32)
Calcium: 9.3 mg/dL (ref 8.9–10.3)
Chloride: 95 mmol/L — ABNORMAL LOW (ref 98–111)
Creatinine, Ser: 1.38 mg/dL — ABNORMAL HIGH (ref 0.61–1.24)
GFR, Estimated: >60 mL/min (ref >60)
Glucose, Bld: 417 mg/dL — ABNORMAL HIGH (ref 70–99)
Potassium: 3.9 mmol/L (ref 3.5–5.1)
Sodium: 132 mmol/L — ABNORMAL LOW (ref 135–145)
Total Bilirubin: 3.7 mg/dL — ABNORMAL HIGH (ref 0.3–1.2)
Total Protein: 8 g/dL (ref 6.5–8.1)

## 2021-04-12 LAB — CBG MONITORING, ED: Glucose-Capillary: 295 mg/dL — ABNORMAL HIGH (ref 70–99)

## 2021-04-12 LAB — LACTIC ACID, PLASMA: Lactic Acid, Venous: 1.8 mmol/L (ref 0.5–1.9)

## 2021-04-12 MED ORDER — SODIUM CHLORIDE 0.9 % IV SOLN
1.0000 g | Freq: Once | INTRAVENOUS | Status: AC
  Administered 2021-04-12: 1 g via INTRAVENOUS
  Filled 2021-04-12: qty 1

## 2021-04-12 MED ORDER — ONDANSETRON HCL 4 MG PO TABS: 4.0000 mg | ORAL_TABLET | Freq: Four times a day (QID) | ORAL | Status: DC | PRN

## 2021-04-12 MED ORDER — ROSUVASTATIN CALCIUM 10 MG PO TABS
40.0000 mg | ORAL_TABLET | Freq: Every day | ORAL | Status: DC
  Administered 2021-04-13 – 2021-04-22 (×7): 40 mg via ORAL
  Filled 2021-04-12 (×4): qty 4
  Filled 2021-04-12: qty 2
  Filled 2021-04-12 (×5): qty 4
  Filled 2021-04-12: qty 2

## 2021-04-12 MED ORDER — INSULIN ASPART 100 UNIT/ML IJ SOLN
0.0000 [IU] | Freq: Three times a day (TID) | INTRAMUSCULAR | Status: DC
  Administered 2021-04-13: 8 [IU] via SUBCUTANEOUS
  Administered 2021-04-13 (×2): 5 [IU] via SUBCUTANEOUS
  Administered 2021-04-14: 3 [IU] via SUBCUTANEOUS
  Administered 2021-04-15: 2 [IU] via SUBCUTANEOUS
  Administered 2021-04-15 (×2): 3 [IU] via SUBCUTANEOUS
  Administered 2021-04-16: 2 [IU] via SUBCUTANEOUS
  Administered 2021-04-16: 3 [IU] via SUBCUTANEOUS
  Administered 2021-04-17 (×3): 5 [IU] via SUBCUTANEOUS
  Administered 2021-04-18: 3 [IU] via SUBCUTANEOUS
  Administered 2021-04-18: 1 [IU] via SUBCUTANEOUS
  Administered 2021-04-18: 5 [IU] via SUBCUTANEOUS
  Filled 2021-04-12 (×17): qty 1

## 2021-04-12 MED ORDER — SODIUM CHLORIDE 0.9 % IV BOLUS
1000.0000 mL | Freq: Once | INTRAVENOUS | Status: AC
  Administered 2021-04-12: 1000 mL via INTRAVENOUS

## 2021-04-12 MED ORDER — ACETAMINOPHEN 650 MG RE SUPP: 650.0000 mg | Freq: Four times a day (QID) | RECTAL | Status: DC | PRN

## 2021-04-12 MED ORDER — VANCOMYCIN HCL IN DEXTROSE 1-5 GM/200ML-% IV SOLN
1000.0000 mg | Freq: Once | INTRAVENOUS | Status: AC
  Administered 2021-04-12: 1000 mg via INTRAVENOUS
  Filled 2021-04-12: qty 200

## 2021-04-12 MED ORDER — INSULIN ASPART 100 UNIT/ML IJ SOLN
0.0000 [IU] | Freq: Every day | INTRAMUSCULAR | Status: DC
  Administered 2021-04-12: 3 [IU] via SUBCUTANEOUS
  Administered 2021-04-13: 2 [IU] via SUBCUTANEOUS
  Administered 2021-04-16 – 2021-04-17 (×2): 3 [IU] via SUBCUTANEOUS
  Administered 2021-04-18: 2 [IU] via SUBCUTANEOUS
  Administered 2021-04-19: 3 [IU] via SUBCUTANEOUS
  Filled 2021-04-12 (×6): qty 1

## 2021-04-12 MED ORDER — VALSARTAN-HYDROCHLOROTHIAZIDE 320-25 MG PO TABS: 1.0000 | ORAL_TABLET | Freq: Every day | ORAL | Status: DC

## 2021-04-12 MED ORDER — HYDROCODONE-ACETAMINOPHEN 5-325 MG PO TABS
1.0000 | ORAL_TABLET | ORAL | Status: DC | PRN
  Administered 2021-04-16 – 2021-04-22 (×8): 2 via ORAL
  Filled 2021-04-12 (×8): qty 2

## 2021-04-12 MED ORDER — SODIUM CHLORIDE 0.9 % IV SOLN: INTRAVENOUS | Status: DC

## 2021-04-12 MED ORDER — ONDANSETRON HCL 4 MG/2ML IJ SOLN: 4.0000 mg | Freq: Four times a day (QID) | INTRAMUSCULAR | Status: DC | PRN

## 2021-04-12 MED ORDER — ACETAMINOPHEN 325 MG PO TABS
650.0000 mg | ORAL_TABLET | Freq: Four times a day (QID) | ORAL | Status: DC | PRN
  Administered 2021-04-13 – 2021-04-15 (×2): 650 mg via ORAL
  Filled 2021-04-12 (×2): qty 2

## 2021-04-12 MED ORDER — SODIUM CHLORIDE 0.9 % IV SOLN
1.0000 g | INTRAVENOUS | Status: DC
  Administered 2021-04-13 – 2021-04-20 (×8): 1 g via INTRAVENOUS
  Filled 2021-04-12 (×2): qty 1
  Filled 2021-04-12: qty 10
  Filled 2021-04-12: qty 1
  Filled 2021-04-12 (×4): qty 10

## 2021-04-12 MED ORDER — METRONIDAZOLE 500 MG/100ML IV SOLN
500.0000 mg | Freq: Once | INTRAVENOUS | Status: AC
  Administered 2021-04-12: 500 mg via INTRAVENOUS
  Filled 2021-04-12: qty 100

## 2021-04-12 MED ORDER — VANCOMYCIN HCL IN DEXTROSE 1-5 GM/200ML-% IV SOLN: 1000.0000 mg | Freq: Once | INTRAVENOUS | Status: DC

## 2021-04-12 MED ORDER — INSULIN ASPART 100 UNIT/ML IJ SOLN
8.0000 [IU] | Freq: Once | INTRAMUSCULAR | Status: AC
  Administered 2021-04-12: 8 [IU] via INTRAVENOUS
  Filled 2021-04-12: qty 1

## 2021-04-12 MED ORDER — NEBIVOLOL HCL 5 MG PO TABS
5.0000 mg | ORAL_TABLET | Freq: Every day | ORAL | Status: DC
  Administered 2021-04-13 – 2021-04-22 (×8): 5 mg via ORAL
  Filled 2021-04-12 (×10): qty 1

## 2021-04-12 NOTE — ED Provider Notes (Signed)
[facility] Emergency Department Provider Note    Event Date/Time   First MD Initiated Contact with Patient 04/12/21 1805     (approximate)  I have reviewed the triage vital signs and the nursing notes.   HISTORY  Chief Complaint Foot Pain    HPI [name] is a 55 y.o. male history of diabetes and hypertension presents to the ER for evaluation of right foot wound.  Seen in podiatry clinic last week and started on doxycycline due to pain swelling blister wound to his right lateral foot.  States he been taking the antibiotic as prescribed without any improvement.  He currently states that the pain is mild to moderate.  Wound is now having some foul-smelling purulent drainage.   Past Medical History:  Diagnosis Date  . Diabetes mellitus without complication ([place])   . Hyperlipidemia   . Hypertension    Family History  Problem Relation Age of Onset  . Heart disease Father   . Heart attack Father   . Healthy Mother   . Prostate cancer Neg Hx   . Kidney cancer Neg Hx   . Bladder Cancer Neg Hx    Past Surgical History:  Procedure Laterality Date  . BONE BIOPSY Left 06/27/2020   Procedure: BONE BIOPSY;  Surgeon: Caroline More, DPM;  Location: [facility] ORS;  Service: Podiatry;  Laterality: Left;  . INCISION AND DRAINAGE Left 06/27/2020   Procedure: INCISION AND DRAINAGE;  Surgeon: Caroline More, DPM;  Location: [facility] ORS;  Service: Podiatry;  Laterality: Left;  . LOWER EXTREMITY ANGIOGRAPHY Left 06/30/2020   Procedure: Lower Extremity Angiography;  Surgeon: Algernon Huxley, MD;  Location: [facility];  Service: Cardiovascular;  Laterality: Left;  . NO PAST SURGERIES     Patient Active Problem List   Diagnosis Date Noted  . Localized edema 10/14/2019  . Proteinuria 10/14/2019  . Secondary renal hyperparathyroidism ([place]) 03/19/2019  . Type 2 diabetes mellitus with left diabetic foot ulcer ([place]) 12/18/2018  . Type 2 diabetes mellitus with chronic  kidney disease and hypertension ([place]) 12/18/2018  . Type 2 diabetes mellitus with hyperlipidemia ([place]) 12/18/2018  . Dyslipidemia associated with type 2 diabetes mellitus ([place]) 07/03/2018  . Vitamin D deficiency 04/11/2018  . ED (erectile dysfunction) 04/10/2018  . Lower urinary tract symptoms (LUTS) 02/09/2017  . Hypertension 07/05/2015  . Diabetes mellitus type 2, uncontrolled ([place]) 07/05/2015  . HLD (hyperlipidemia) 07/05/2015      Prior to Admission medications   Medication Sig Start Date End Date Taking? Authorizing Provider  doxycycline (VIBRA-TABS) 100 MG tablet Take 1 tablet (100 mg total) by mouth 2 (two) times daily. 04/05/21   Felipa Furnace, DPM  empagliflozin (JARDIANCE) 25 MG TABS tablet Take 1 tablet (25 mg total) by mouth daily. 02/09/21   [name], MD  glipiZIDE (GLUCOTROL XL) 10 MG 24 hr tablet Take 1 tablet (10 mg total) by mouth daily with breakfast. 02/09/21   [name], MD  nebivolol (BYSTOLIC) 5 MG tablet Take 1 tablet (5 mg total) by mouth daily. 02/09/21   [name], MD  pioglitazone (ACTOS) 15 MG tablet Take 1 tablet (15 mg total) by mouth daily. 02/09/21   [name], MD  rosuvastatin (CRESTOR) 40 MG tablet Take 1 tablet (40 mg total) by mouth daily. 02/09/21   [name], MD  torsemide (DEMADEX) 10 MG tablet Take 1 tablet by mouth daily as needed. 10/15/19   Murlean Iba, MD  valsartan-hydrochlorothiazide (DIOVAN-HCT) 320-25 MG tablet Take  1 tablet by mouth daily. 02/09/21   [name], MD  Vitamin D, Ergocalciferol, (DRISDOL) 1.25 MG (50000 UNIT) CAPS capsule Take 1 capsule (50,000 Units total) by mouth every 7 (seven) days. 12/29/20   [name], MD    Allergies Patient has no known allergies.    Social History Social History   Tobacco Use  . Smoking status: Never Smoker  . Smokeless tobacco: Never Used  Vaping Use  . Vaping Use: Never used  Substance Use Topics  . Alcohol use: Yes    Alcohol/week: 6.0 - 12.0 standard  drinks    Types: 6 - 12 Cans of beer per week    Comment: Occasional  . Drug use: No    Review of Systems Patient denies headaches, rhinorrhea, blurry vision, numbness, shortness of breath, chest pain, edema, cough, abdominal pain, nausea, vomiting, diarrhea, dysuria, fevers, rashes or hallucinations unless otherwise stated above in HPI. ____________________________________________   PHYSICAL EXAM:  VITAL SIGNS: Vitals:   04/12/21 1630  BP: (!) 148/77  Pulse: 81  Resp: 18  Temp: 98.7 F (37.1 C)  SpO2: 96%    Constitutional: Alert and oriented.  Eyes: Conjunctivae are normal.  Head: Atraumatic. Nose: No congestion/rhinnorhea. Mouth/Throat: Mucous membranes are moist.   Neck: No stridor. Painless ROM.  Cardiovascular: Normal rate, regular rhythm. Grossly normal heart sounds.  Good peripheral circulation. Respiratory: Normal respiratory effort.  No retractions. Lungs CTAB. Gastrointestinal: Soft and nontender. No distention. No abdominal bruits. No CVA tenderness. Genitourinary:  Musculoskeletal: Gangrenous changes to the right foot with pitting edema overlying erythema and warmth and streaking cellulitic changes.  No joint effusions.  Unable to palate dp and pt 2/2 edema, brisk cap refill Neurologic:  Normal speech and language. No gross focal neurologic deficits are appreciated. No facial droop Skin:  Skin is warm, dry and intact. No rash noted. Psychiatric: Mood and affect are normal. Speech and behavior are normal.  ____________________________________________   LABS (all labs ordered are listed, but only abnormal results are displayed)  Results for orders placed or performed during the hospital encounter of 04/12/21 (from the past 24 hour(s))  Lactic acid, plasma     Status: None   Collection Time: 04/12/21  4:31 PM  Result Value Ref Range   Lactic Acid, Venous 1.8 0.5 - 1.9 mmol/L  Comprehensive metabolic panel     Status: Abnormal   Collection Time: 04/12/21   4:31 PM  Result Value Ref Range   Sodium 132 (L) 135 - 145 mmol/L   Potassium 3.9 3.5 - 5.1 mmol/L   Chloride 95 (L) 98 - 111 mmol/L   CO2 18 (L) 22 - 32 mmol/L   Glucose, Bld 417 (H) 70 - 99 mg/dL   BUN 53 (H) 6 - 20 mg/dL   Creatinine, Ser 1.38 (H) 0.61 - 1.24 mg/dL   Calcium 9.3 8.9 - 10.3 mg/dL   Total Protein 8.0 6.5 - 8.1 g/dL   Albumin 2.7 (L) 3.5 - 5.0 g/dL   AST 12 (L) 15 - 41 U/L   ALT 11 0 - 44 U/L   Alkaline Phosphatase 86 38 - 126 U/L   Total Bilirubin 3.7 (H) 0.3 - 1.2 mg/dL   GFR, Estimated >60 >60 mL/min   Anion gap 19 (H) 5 - 15  CBC with Differential     Status: Abnormal   Collection Time: 04/12/21  4:31 PM  Result Value Ref Range   WBC 26.8 (H) 4.0 - 10.5 K/uL   RBC 3.66 (L) 4.22 -  5.81 MIL/uL   Hemoglobin 11.1 (L) 13.0 - 17.0 g/dL   HCT 33.5 (L) 39.0 - 52.0 %   MCV 91.5 80.0 - 100.0 fL   MCH 30.3 26.0 - 34.0 pg   MCHC 33.1 30.0 - 36.0 g/dL   RDW 11.2 (L) 11.5 - 15.5 %   Platelets 420 (H) 150 - 400 K/uL   nRBC 0.0 0.0 - 0.2 %   Neutrophils Relative % 89 %   Neutro Abs 23.7 (H) 1.7 - 7.7 K/uL   Lymphocytes Relative 2 %   Lymphs Abs 0.6 (L) 0.7 - 4.0 K/uL   Monocytes Relative 8 %   Monocytes Absolute 2.1 (H) 0.1 - 1.0 K/uL   Eosinophils Relative 0 %   Eosinophils Absolute 0.0 0.0 - 0.5 K/uL   Basophils Relative 0 %   Basophils Absolute 0.1 0.0 - 0.1 K/uL   Immature Granulocytes 1 %   Abs Immature Granulocytes 0.28 (H) 0.00 - 0.07 K/uL   ____________________________________________  ________________________________  RADIOLOGY   ____________________________________________   PROCEDURES  Procedure(s) performed:  Procedures    Critical Care performed: no ____________________________________________   INITIAL IMPRESSION / ASSESSMENT AND PLAN / ED COURSE  Pertinent labs & imaging results that were available during my care of the patient were reviewed by me and considered in my medical decision making (see chart for details).   DDX:  Diabetic foot wound, gangrene, cellulitis, abscess, osteo-, and STI  [name] is a 55 y.o. who presents to the ED with presentation as described above.  Patient with worsening diabetic foot wound despite antibiotics as an outpatient.  Not febrile but with significant leukocytosis metabolic acidosis with no lactic acidemia.  Symptoms likely worsened by probable PAD.  Will order broad-spectrum IV antibiotics will give IV fluids as well as IV insulin.  Will discuss with hospitalist for admission.     The patient was evaluated in Emergency Department today for the symptoms described in the history of present illness. He/she was evaluated in the context of the global COVID-19 pandemic, which necessitated consideration that the patient might be at risk for infection with the SARS-CoV-2 virus that causes COVID-19. Institutional protocols and algorithms that pertain to the evaluation of patients at risk for COVID-19 are in a state of rapid change based on information released by regulatory bodies including the CDC and federal and state organizations. These policies and algorithms were followed during the patient's care in the ED.  As part of my medical decision making, I reviewed the following data within the [place] notes reviewed and incorporated, Labs reviewed, notes from prior ED visits and [place] Controlled Substance Database   ____________________________________________   FINAL CLINICAL IMPRESSION(S) / ED DIAGNOSES  Final diagnoses:  Gangrene of right foot ([place])      NEW MEDICATIONS STARTED DURING THIS VISIT:  New Prescriptions   No medications on file     Note:  This document was prepared using Dragon voice recognition software and may include unintentional dictation errors.    Merlyn Lot, MD 04/12/21 [phone]

## 2021-04-12 NOTE — Consult Note (Signed)
PHARMACY -  BRIEF ANTIBIOTIC NOTE   Pharmacy has received consult(s) for vancomycin from an ED provider.  The patient's profile has been reviewed for ht/wt/allergies/indication/available labs.    One time order(s) placed for vancomycin 1 g IV since the patients hight and weight have not been entered at this time  Further antibiotics/pharmacy consults should be ordered by admitting physician if indicated.                       Thank you, Darnelle Bos 04/12/2021  7:01 PM

## 2021-04-12 NOTE — ED Triage Notes (Addendum)
Pt comes with c/ o right foot pain and infection. Pt prescribed doxcy with no improvement for 1 week. Pt states he feels it is getting worse.  Pt is diabetic. Pt has swelling not to foot, ankle and leg. Pt has bandage in place.

## 2021-04-12 NOTE — H&P (Signed)
History and Physical    [name] YKD:[id number] DOB: Jan 25, 1966 DOA: 04/12/2021  PCP: [name], MD   Patient coming from: Home  I have personally briefly reviewed patient's old medical records in [place]  Chief Complaint: Right foot pain  HPI: [name] is a 55 y.o. male with medical history significant for DM, HTN, PAD with history of left partial fifth metatarsal base resection July 2021 after presenting with gangrene who presents to the emergency room with progressive worsening of an ulcer of the base of the right foot similar to presentation a year prior with the left foot.  It is painful with walking.  He saw podiatry on 4/26 and underwent excisional debridement and was prescribed doxycycline however he states that the wound appears to be getting worse and he is having increased pain in the foot as well as swelling.  He denies fever or chills.  Denies nausea vomiting or myalgias.  Denies cough or shortness of breath and denies rash. ED course: On arrival afebrile, BP 148/77, pulse 81 with O2 sat 96% on room air.  Leukocytosis of 26,000 with normal lactic acid of 1.8.  Hemoglobin 11.1, down from 14.7 a couple months prior.  Creatinine 1.38 up from 1.02, his baseline.  Other abnormalities include mild hyponatremia of 132, elevated blood sugar 417 with anion gap of 19 and elevated total bilirubin of 3.7. Imaging: Right foot x-ray showing soft tissue ulceration lateral aspect midfoot with no evidence of underlying osteomyelitis with diffuse soft tissue edema  Patient started on cefepime and metronidazole as well as vancomycin.  Also given a single dose of IV insulin 8 units.  Hospitalist consulted for admission. Review of Systems: As per HPI otherwise all other systems on review of systems negative.    Past Medical History:  Diagnosis Date  . Diabetes mellitus without complication ([place])   . Hyperlipidemia   . Hypertension     Past Surgical History:  Procedure  Laterality Date  . BONE BIOPSY Left 06/27/2020   Procedure: BONE BIOPSY;  Surgeon: Caroline More, DPM;  Location: [facility];  Service: Podiatry;  Laterality: Left;  . INCISION AND DRAINAGE Left 06/27/2020   Procedure: INCISION AND DRAINAGE;  Surgeon: Caroline More, DPM;  Location: [facility] ORS;  Service: Podiatry;  Laterality: Left;  . LOWER EXTREMITY ANGIOGRAPHY Left 06/30/2020   Procedure: Lower Extremity Angiography;  Surgeon: Algernon Huxley, MD;  Location: [facility];  Service: Cardiovascular;  Laterality: Left;  . NO PAST SURGERIES       reports that he has never smoked. He has never used smokeless tobacco. He reports current alcohol use of about 6.0 - 12.0 standard drinks of alcohol per week. He reports that he does not use drugs.  No Known Allergies  Family History  Problem Relation Age of Onset  . Heart disease Father   . Heart attack Father   . Healthy Mother   . Prostate cancer Neg Hx   . Kidney cancer Neg Hx   . Bladder Cancer Neg Hx       Prior to Admission medications   Medication Sig Start Date End Date Taking? Authorizing Provider  doxycycline (VIBRA-TABS) 100 MG tablet Take 1 tablet (100 mg total) by mouth 2 (two) times daily. 04/05/21   Felipa Furnace, DPM  empagliflozin (JARDIANCE) 25 MG TABS tablet Take 1 tablet (25 mg total) by mouth daily. 02/09/21   [name], MD  glipiZIDE (GLUCOTROL XL) 10 MG 24 hr tablet Take  1 tablet (10 mg total) by mouth daily with breakfast. 02/09/21   [name], MD  nebivolol (BYSTOLIC) 5 MG tablet Take 1 tablet (5 mg total) by mouth daily. 02/09/21   [name], MD  pioglitazone (ACTOS) 15 MG tablet Take 1 tablet (15 mg total) by mouth daily. 02/09/21   [name], MD  rosuvastatin (CRESTOR) 40 MG tablet Take 1 tablet (40 mg total) by mouth daily. 02/09/21   [name], MD  torsemide (DEMADEX) 10 MG tablet Take 1 tablet by mouth daily as needed. 10/15/19   Murlean Iba, MD  valsartan-hydrochlorothiazide (DIOVAN-HCT)  320-25 MG tablet Take 1 tablet by mouth daily. 02/09/21   [name], MD  Vitamin D, Ergocalciferol, (DRISDOL) 1.25 MG (50000 UNIT) CAPS capsule Take 1 capsule (50,000 Units total) by mouth every 7 (seven) days. 12/29/20   [name], MD    Physical Exam: Vitals:   04/12/21 1630  BP: (!) 148/77  Pulse: 81  Resp: 18  Temp: 98.7 F (37.1 C)  TempSrc: Oral  SpO2: 96%     Vitals:   04/12/21 1630  BP: (!) 148/77  Pulse: 81  Resp: 18  Temp: 98.7 F (37.1 C)  TempSrc: Oral  SpO2: 96%      Constitutional: Alert and oriented x 3 . Not in any apparent distress HEENT:      Head: Normocephalic and atraumatic.         Eyes: PERLA, EOMI, Conjunctivae are normal. Sclera is non-icteric.       Mouth/Throat: Mucous membranes are moist.       Neck: Supple with no signs of meningismus. Cardiovascular: Regular rate and rhythm. No murmurs, gallops, or rubs.. No JVD.  2+ LE edema Respiratory: Respiratory effort normal .Lungs sounds clear bilaterally. No wheezes, crackles, or rhonchi.  Gastrointestinal: Soft, non tende, and non distended with positive bowel sounds.  Genitourinary: No CVA tenderness. Musculoskeletal:  2 cm ulcer mid lateral right foot with surrounding darkening of skin and fluctuance.  Entire dorsum of right foot with redness and swelling and warmth  Neurologic:  Face is symmetric. Moving all extremities. No gross focal neurologic deficits . Skin: Skin is warm, dry.  Ulcer right foot, see musculoskeletal Psychiatric: Mood and affect are normal    Labs on Admission: I have personally reviewed following labs and imaging studies  CBC: Recent Labs  Lab 04/12/21 1631  WBC 26.8*  NEUTROABS 23.7*  HGB 11.1*  HCT 33.5*  MCV 91.5  PLT 366*   Basic Metabolic Panel: Recent Labs  Lab 04/12/21 1631  NA 132*  K 3.9  CL 95*  CO2 18*  GLUCOSE 417*  BUN 53*  CREATININE 1.38*  CALCIUM 9.3   GFR: CrCl cannot be calculated (Unknown ideal weight.). Liver Function  Tests: Recent Labs  Lab 04/12/21 1631  AST 12*  ALT 11  ALKPHOS 86  BILITOT 3.7*  PROT 8.0  ALBUMIN 2.7*   No results for input(s): LIPASE, AMYLASE in the last 168 hours. No results for input(s): AMMONIA in the last 168 hours. Coagulation Profile: No results for input(s): INR, PROTIME in the last 168 hours. Cardiac Enzymes: No results for input(s): CKTOTAL, CKMB, CKMBINDEX, TROPONINI in the last 168 hours. BNP (last 3 results) No results for input(s): PROBNP in the last 8760 hours. HbA1C: No results for input(s): HGBA1C in the last 72 hours. CBG: No results for input(s): GLUCAP in the last 168 hours. Lipid Profile: No results for input(s): CHOL, HDL, LDLCALC, TRIG, CHOLHDL, LDLDIRECT in the last  72 hours. Thyroid Function Tests: No results for input(s): TSH, T4TOTAL, FREET4, T3FREE, THYROIDAB in the last 72 hours. Anemia Panel: No results for input(s): VITAMINB12, FOLATE, FERRITIN, TIBC, IRON, RETICCTPCT in the last 72 hours. Urine analysis: No results found for: COLORURINE, APPEARANCEUR, LABSPEC, [place], GLUCOSEU, HGBUR, BILIRUBINUR, KETONESUR, PROTEINUR, UROBILINOGEN, NITRITE, LEUKOCYTESUR  Radiological Exams on Admission: DG Foot Complete Right  Result Date: 04/12/2021 CLINICAL DATA:  Right foot pain, infection EXAM: RIGHT FOOT COMPLETE - 3+ VIEW COMPARISON:  04/05/2021 FINDINGS: Frontal, oblique, and lateral views of the right foot are obtained. No fracture, subluxation, or dislocation. Ulceration within the lateral midfoot overlying the base of the fifth metatarsal. Minimal subcutaneous gas. No underlying bony destruction or periosteal reaction to suggest osteomyelitis. There is diffuse soft tissue swelling throughout the remainder of the forefoot and midfoot. IMPRESSION: 1. Soft tissue ulceration lateral aspect midfoot, with no evidence of underlying osteomyelitis. 2. Diffuse soft tissue edema consistent with cellulitis. Electronically Signed   By: Randa Ngo M.D.   On:  04/12/2021 18:48     Assessment/Plan 55 year old male DM, HTN, PAD with history of left partial fifth metatarsal base resection July 2021 after presenting with gangrene presenting with progressively worsening right foot ulcer not responding to outpatient debridement and antibiotics.     Cellulitis of right foot   Diabetes foot ulcer, right foot with infection    PAD with history of left metatarsal base amputation - Patient with infected appearing right foot ulcer, leukocytosis of 26,000 in spite of outpatient doxycycline and wound debridement on 4/26 - Continue Rocephin and vancomycin - Continue rosuvastatin for PAD.  Not on antiplatelets - Wound care  - Podiatry and vascular consults    Hyperglycemia due to type 2 diabetes mellitus, possible DKA - Blood sugar 417 with anion gap of 19 and mild hyponatremia - We will get beta hydroxybutyric acid to evaluate for DKA - Received IV insulin one-time dose in the ER - IV hydration and monitor - Sliding scale insulin.  If DKA we will start IV insulin    Hypertension - Continue home valsartan HCTZ and labetalol    AKI (acute kidney injury) ([place]) - Creatinine 1.38, up from baseline of 1.0 to a couple months prior - IV hydration and monitor    Anemia, possibly blood loss of uncertain acuity - Hemoglobin 11.1, down from 14.7 a couple months prior - Possibly related to recent debridement on 4/26 - Anemia panel and stool for occult blood - Continue to monitor H&H and transfuse if necessary    DVT prophylaxis: SCD to unaffected leg while assessing stability of hemoglobin Code Status: full code  Family Communication:  none  Disposition Plan: Back to previous home environment Consults called: Vascular and podiatry Status:At the time of admission, it appears that the appropriate admission status for this patient is INPATIENT. This is judged to be reasonable and necessary in order to provide the required intensity of service to ensure the  patient's safety given the presenting symptoms, physical exam findings, and initial radiographic and laboratory data in the context of their  Comorbid conditions.   Patient requires inpatient status due to high intensity of service, high risk for further deterioration and high frequency of surveillance required.   I certify that at the point of admission it is my clinical judgment that the patient will require inpatient hospital care spanning beyond [place] MD [facility]     04/12/2021, 7:32 PM

## 2021-04-13 ENCOUNTER — Inpatient Hospital Stay: Payer: Managed Care, Other (non HMO)

## 2021-04-13 ENCOUNTER — Encounter: Payer: Self-pay | Admitting: Internal Medicine

## 2021-04-13 ENCOUNTER — Other Ambulatory Visit (INDEPENDENT_AMBULATORY_CARE_PROVIDER_SITE_OTHER): Payer: Self-pay | Admitting: Vascular Surgery

## 2021-04-13 DIAGNOSIS — E11621 Type 2 diabetes mellitus with foot ulcer: Secondary | ICD-10-CM

## 2021-04-13 DIAGNOSIS — L03115 Cellulitis of right lower limb: Secondary | ICD-10-CM | POA: Diagnosis not present

## 2021-04-13 DIAGNOSIS — Z794 Long term (current) use of insulin: Secondary | ICD-10-CM

## 2021-04-13 DIAGNOSIS — L97512 Non-pressure chronic ulcer of other part of right foot with fat layer exposed: Secondary | ICD-10-CM

## 2021-04-13 DIAGNOSIS — N179 Acute kidney failure, unspecified: Secondary | ICD-10-CM

## 2021-04-13 DIAGNOSIS — E1165 Type 2 diabetes mellitus with hyperglycemia: Secondary | ICD-10-CM

## 2021-04-13 LAB — COMPREHENSIVE METABOLIC PANEL
ALT: 9 U/L (ref 0–44)
AST: 8 U/L — ABNORMAL LOW (ref 15–41)
Albumin: 2 g/dL — ABNORMAL LOW (ref 3.5–5.0)
Alkaline Phosphatase: 75 U/L (ref 38–126)
Anion gap: 16 — ABNORMAL HIGH (ref 5–15)
BUN: 50 mg/dL — ABNORMAL HIGH (ref 6–20)
CO2: 18 mmol/L — ABNORMAL LOW (ref 22–32)
Calcium: 8.6 mg/dL — ABNORMAL LOW (ref 8.9–10.3)
Chloride: 101 mmol/L (ref 98–111)
Creatinine, Ser: 1.19 mg/dL (ref 0.61–1.24)
GFR, Estimated: >60 mL/min (ref >60)
Glucose, Bld: 236 mg/dL — ABNORMAL HIGH (ref 70–99)
Potassium: 3.6 mmol/L (ref 3.5–5.1)
Sodium: 135 mmol/L (ref 135–145)
Total Bilirubin: 2.6 mg/dL — ABNORMAL HIGH (ref 0.3–1.2)
Total Protein: 6.6 g/dL (ref 6.5–8.1)

## 2021-04-13 LAB — URINALYSIS, COMPLETE (UACMP) WITH MICROSCOPIC
Bacteria, UA: NONE SEEN
Bilirubin Urine: NEGATIVE
Glucose, UA: >=500 mg/dL — AB
Ketones, ur: 20 mg/dL — AB
Leukocytes,Ua: NEGATIVE
Nitrite: NEGATIVE
Protein, ur: 30 mg/dL — AB
Specific Gravity, Urine: 1.023 (ref 1.005–1.030)
pH: 5 (ref 5.0–8.0)

## 2021-04-13 LAB — CBC
HCT: 29.6 % — ABNORMAL LOW (ref 39.0–52.0)
Hemoglobin: 9.7 g/dL — ABNORMAL LOW (ref 13.0–17.0)
MCH: 30.4 pg (ref 26.0–34.0)
MCHC: 32.8 g/dL (ref 30.0–36.0)
MCV: 92.8 fL (ref 80.0–100.0)
Platelets: 343 10*3/uL (ref 150–400)
RBC: 3.19 MIL/uL — ABNORMAL LOW (ref 4.22–5.81)
RDW: 11.2 % — ABNORMAL LOW (ref 11.5–15.5)
WBC: 25.6 10*3/uL — ABNORMAL HIGH (ref 4.0–10.5)
nRBC: 0 % (ref 0.0–0.2)

## 2021-04-13 LAB — FERRITIN: Ferritin: 1118 ng/mL — ABNORMAL HIGH (ref 24–336)

## 2021-04-13 LAB — GLUCOSE, CAPILLARY
Glucose-Capillary: 217 mg/dL — ABNORMAL HIGH (ref 70–99)
Glucose-Capillary: 259 mg/dL — ABNORMAL HIGH (ref 70–99)
Glucose-Capillary: 228 mg/dL — ABNORMAL HIGH (ref 70–99)
Glucose-Capillary: 235 mg/dL — ABNORMAL HIGH (ref 70–99)

## 2021-04-13 LAB — IRON AND TIBC
Iron: 30 ug/dL — ABNORMAL LOW (ref 45–182)
Saturation Ratios: 18 % (ref 17.9–39.5)
TIBC: 168 ug/dL — ABNORMAL LOW (ref 250–450)
UIBC: 138 ug/dL

## 2021-04-13 LAB — BETA-HYDROXYBUTYRIC ACID: Beta-Hydroxybutyric Acid: 5.46 mmol/L — ABNORMAL HIGH (ref 0.05–0.27)

## 2021-04-13 LAB — SARS CORONAVIRUS 2 (TAT 6-24 HRS): SARS Coronavirus 2: NEGATIVE

## 2021-04-13 LAB — HEMOGLOBIN A1C
Hgb A1c MFr Bld: 9.9 % — ABNORMAL HIGH (ref 4.8–5.6)
Mean Plasma Glucose: 237.43 mg/dL

## 2021-04-13 LAB — VITAMIN B12: Vitamin B-12: 231 pg/mL (ref 180–914)

## 2021-04-13 LAB — FOLATE: Folate: 14.3 ng/mL (ref >5.9)

## 2021-04-13 LAB — LACTIC ACID, PLASMA: Lactic Acid, Venous: 1.1 mmol/L (ref 0.5–1.9)

## 2021-04-13 MED ORDER — IRBESARTAN 150 MG PO TABS
300.0000 mg | ORAL_TABLET | Freq: Every day | ORAL | Status: DC
  Administered 2021-04-13 – 2021-04-22 (×6): 300 mg via ORAL
  Filled 2021-04-13 (×8): qty 2

## 2021-04-13 MED ORDER — VANCOMYCIN HCL 1000 MG/200ML IV SOLN
1000.0000 mg | Freq: Two times a day (BID) | INTRAVENOUS | Status: DC
  Administered 2021-04-14 – 2021-04-15 (×2): 1000 mg via INTRAVENOUS
  Filled 2021-04-13 (×5): qty 200

## 2021-04-13 MED ORDER — GENTAMICIN SULFATE 0.1 % EX OINT
TOPICAL_OINTMENT | Freq: Two times a day (BID) | CUTANEOUS | Status: DC
  Administered 2021-04-18: 1 via TOPICAL
  Filled 2021-04-13: qty 15

## 2021-04-13 MED ORDER — HYDROCHLOROTHIAZIDE 25 MG PO TABS
25.0000 mg | ORAL_TABLET | Freq: Every day | ORAL | Status: DC
  Administered 2021-04-13 – 2021-04-22 (×6): 25 mg via ORAL
  Filled 2021-04-13 (×8): qty 1

## 2021-04-13 MED ORDER — INSULIN DETEMIR 100 UNIT/ML ~~LOC~~ SOLN
10.0000 [IU] | Freq: Every day | SUBCUTANEOUS | Status: DC
  Administered 2021-04-13: 10 [IU] via SUBCUTANEOUS
  Filled 2021-04-13 (×2): qty 0.1

## 2021-04-13 MED ORDER — GADOBUTROL 1 MMOL/ML IV SOLN
9.0000 mL | Freq: Once | INTRAVENOUS | Status: AC | PRN
  Administered 2021-04-13: 9 mL via INTRAVENOUS

## 2021-04-13 MED ORDER — SODIUM CHLORIDE 0.9 % IV SOLN: INTRAVENOUS | Status: DC

## 2021-04-13 MED ORDER — METRONIDAZOLE 500 MG PO TABS
500.0000 mg | ORAL_TABLET | Freq: Three times a day (TID) | ORAL | Status: DC
  Administered 2021-04-13 – 2021-04-15 (×8): 500 mg via ORAL
  Filled 2021-04-13 (×9): qty 1

## 2021-04-13 MED ORDER — VANCOMYCIN HCL 2000 MG/400ML IV SOLN
2000.0000 mg | INTRAVENOUS | Status: DC
  Administered 2021-04-13: 2000 mg via INTRAVENOUS
  Filled 2021-04-13: qty 400

## 2021-04-13 NOTE — Plan of Care (Signed)

## 2021-04-13 NOTE — Consult Note (Signed)
Reason for Consult: Right foot ulcer   [name] is an 55 y.o. male.  HPI: He has a history of left foot ulceration and osteomyelitis requiring surgical treatment with Dr. Luana Shu with [facility].  Also underwent angiography without intervention last year by Dr. Lucky Cowboy.  So this wound on the right foot developed a few weeks ago he saw my partner Dr. Posey Pronto in the office last week.  Became much more painful over the last couple days and presented to the ER.  Past Medical History:  Diagnosis Date  . Diabetes mellitus without complication ([place])   . Hyperlipidemia   . Hypertension     Past Surgical History:  Procedure Laterality Date  . BONE BIOPSY Left 06/27/2020   Procedure: BONE BIOPSY;  Surgeon: Caroline More, DPM;  Location: [facility];  Service: Podiatry;  Laterality: Left;  . INCISION AND DRAINAGE Left 06/27/2020   Procedure: INCISION AND DRAINAGE;  Surgeon: Caroline More, DPM;  Location: [facility];  Service: Podiatry;  Laterality: Left;  . LOWER EXTREMITY ANGIOGRAPHY Left 06/30/2020   Procedure: Lower Extremity Angiography;  Surgeon: Algernon Huxley, MD;  Location: [facility];  Service: Cardiovascular;  Laterality: Left;  . NO PAST SURGERIES      Family History  Problem Relation Age of Onset  . Heart disease Father   . Heart attack Father   . Healthy Mother   . Prostate cancer Neg Hx   . Kidney cancer Neg Hx   . Bladder Cancer Neg Hx     Social History:  reports that he has never smoked. He has never used smokeless tobacco. He reports current alcohol use of about 6.0 - 12.0 standard drinks of alcohol per week. He reports that he does not use drugs.  Allergies: No Known Allergies  Medications: I have reviewed the patient's current medications.  Results for orders placed or performed during the hospital encounter of 04/12/21 (from the past 48 hour(s))  Lactic acid, plasma     Status: None   Collection Time: 04/12/21  4:31 PM  Result Value Ref Range   Lactic Acid,  Venous 1.8 0.5 - 1.9 mmol/L    Comment: Performed at [facility], [address] [place], [place] [address]  Comprehensive metabolic panel     Status: Abnormal   Collection Time: 04/12/21  4:31 PM  Result Value Ref Range   Sodium 132 (L) 135 - 145 mmol/L   Potassium 3.9 3.5 - 5.1 mmol/L   Chloride 95 (L) 98 - 111 mmol/L   CO2 18 (L) 22 - 32 mmol/L   Glucose, Bld 417 (H) 70 - 99 mg/dL    Comment: Glucose reference range applies only to samples taken after fasting for at least 8 hours.   BUN 53 (H) 6 - 20 mg/dL   Creatinine, Ser 1.38 (H) 0.61 - 1.24 mg/dL   Calcium 9.3 8.9 - 10.3 mg/dL   Total Protein 8.0 6.5 - 8.1 g/dL   Albumin 2.7 (L) 3.5 - 5.0 g/dL   AST 12 (L) 15 - 41 U/L   ALT 11 0 - 44 U/L   Alkaline Phosphatase 86 38 - 126 U/L   Total Bilirubin 3.7 (H) 0.3 - 1.2 mg/dL   GFR, Estimated >60 >60 mL/min    Comment: (NOTE) Calculated using the CKD-EPI Creatinine Equation (2021)    Anion gap 19 (H) 5 - 15    Comment: Performed at [facility], [address] [place], [place] [address]  CBC with Differential     Status: Abnormal   Collection Time: 04/12/21  4:31 PM  Result Value Ref Range   WBC 26.8 (H) 4.0 - 10.5 K/uL   RBC 3.66 (L) 4.22 - 5.81 MIL/uL   Hemoglobin 11.1 (L) 13.0 - 17.0 g/dL   HCT 33.5 (L) 39.0 - 52.0 %   MCV 91.5 80.0 - 100.0 fL   MCH 30.3 26.0 - 34.0 pg   MCHC 33.1 30.0 - 36.0 g/dL   RDW 11.2 (L) 11.5 - 15.5 %   Platelets 420 (H) 150 - 400 K/uL   nRBC 0.0 0.0 - 0.2 %   Neutrophils Relative % 89 %   Neutro Abs 23.7 (H) 1.7 - 7.7 K/uL   Lymphocytes Relative 2 %   Lymphs Abs 0.6 (L) 0.7 - 4.0 K/uL   Monocytes Relative 8 %   Monocytes Absolute 2.1 (H) 0.1 - 1.0 K/uL   Eosinophils Relative 0 %   Eosinophils Absolute 0.0 0.0 - 0.5 K/uL   Basophils Relative 0 %   Basophils Absolute 0.1 0.0 - 0.1 K/uL   Immature Granulocytes 1 %   Abs Immature Granulocytes 0.28 (H) 0.00 - 0.07 K/uL    Comment: Performed at [facility], [address] [place], [place] [address]  CBG monitoring, ED     Status: Abnormal   Collection Time: 04/12/21 11:23 PM  Result Value Ref Range   Glucose-Capillary 295 (H) 70 - 99 mg/dL    Comment: Glucose reference range applies only to samples taken after fasting for at least 8 hours.  Lactic acid, plasma     Status: None   Collection Time: 04/12/21 11:39 PM  Result Value Ref Range   Lactic Acid, Venous 1.1 0.5 - 1.9 mmol/L    Comment: Performed at [facility], [address] [place], [place] [address]  Urinalysis, Complete w Microscopic     Status: Abnormal   Collection Time: 04/12/21 11:39 PM  Result Value Ref Range   Color, Urine YELLOW (A) YELLOW   APPearance CLEAR (A) CLEAR   Specific Gravity, Urine 1.023 1.005 - 1.030   pH 5.0 5.0 - 8.0   Glucose, UA >=500 (A) NEGATIVE mg/dL   Hgb urine dipstick SMALL (A) NEGATIVE   Bilirubin Urine NEGATIVE NEGATIVE   Ketones, ur 20 (A) NEGATIVE mg/dL   Protein, ur 30 (A) NEGATIVE mg/dL   Nitrite NEGATIVE NEGATIVE   Leukocytes,Ua NEGATIVE NEGATIVE   RBC / HPF 0-5 0 - 5 RBC/hpf   WBC, UA 0-5 0 - 5 WBC/hpf   Bacteria, UA NONE SEEN NONE SEEN   Squamous Epithelial / LPF 0-5 0 - 5   Mucus PRESENT     Comment: Performed at [facility], [address], [place], [place] [address]  Beta-hydroxybutyric acid     Status: Abnormal   Collection Time: 04/12/21 11:39 PM  Result Value Ref Range   Beta-Hydroxybutyric Acid 5.46 (H) 0.05 - 0.27 mmol/L    Comment: RESULT CONFIRMED BY MANUAL DILUTION SKL Performed at [facility], [address] [place], [place] [address]   Hemoglobin A1c     Status: Abnormal   Collection Time: 04/12/21 11:39 PM  Result Value Ref Range   Hgb A1c MFr Bld 9.9 (H) 4.8 - 5.6 %    Comment: (NOTE) Pre diabetes:          5.7%-6.4%  Diabetes:              >6.4%  Glycemic control  for   <7.0% adults with diabetes    Mean Plasma Glucose 237.43 mg/dL    Comment: Performed at [facility] [address] [place], [place] [address]  Comprehensive metabolic panel     Status: Abnormal   Collection Time: 04/13/21  5:11 AM  Result Value Ref Range   Sodium 135 135 - 145 mmol/L   Potassium 3.6 3.5 - 5.1 mmol/L   Chloride 101 98 - 111 mmol/L   CO2 18 (L) 22 - 32 mmol/L   Glucose, Bld 236 (H) 70 - 99 mg/dL    Comment: Glucose reference range applies only to samples taken after fasting for at least 8 hours.   BUN 50 (H) 6 - 20 mg/dL   Creatinine, Ser 1.19 0.61 - 1.24 mg/dL   Calcium 8.6 (L) 8.9 - 10.3 mg/dL   Total Protein 6.6 6.5 - 8.1 g/dL   Albumin 2.0 (L) 3.5 - 5.0 g/dL   AST 8 (L) 15 - 41 U/L   ALT 9 0 - 44 U/L   Alkaline Phosphatase 75 38 - 126 U/L   Total Bilirubin 2.6 (H) 0.3 - 1.2 mg/dL   GFR, Estimated >60 >60 mL/min    Comment: (NOTE) Calculated using the CKD-EPI Creatinine Equation (2021)    Anion gap 16 (H) 5 - 15    Comment: Performed at [facility], [address] [place], [place] [address]  CBC     Status: Abnormal   Collection Time: 04/13/21  5:11 AM  Result Value Ref Range   WBC 25.6 (H) 4.0 - 10.5 K/uL   RBC 3.19 (L) 4.22 - 5.81 MIL/uL   Hemoglobin 9.7 (L) 13.0 - 17.0 g/dL   HCT 29.6 (L) 39.0 - 52.0 %   MCV 92.8 80.0 - 100.0 fL   MCH 30.4 26.0 - 34.0 pg   MCHC 32.8 30.0 - 36.0 g/dL   RDW 11.2 (L) 11.5 - 15.5 %   Platelets 343 150 - 400 K/uL   nRBC 0.0 0.0 - 0.2 %    Comment: Performed at [facility], [place] [place], [place] [address]  Iron and TIBC     Status: Abnormal   Collection Time: 04/13/21  5:11 AM  Result Value Ref Range   Iron 30 (L) 45 - 182 ug/dL   TIBC 168 (L) 250 - 450 ug/dL   Saturation Ratios 18 17.9 - 39.5 %   UIBC 138 ug/dL    Comment: Performed at [facility], [address] [place], [place] [address]  Ferritin     Status: Abnormal   Collection Time: 04/13/21  5:11 AM  Result Value Ref Range   Ferritin 1,118 (H) 24 - 336 ng/mL    Comment: Performed at [facility], [address] [place], [place] [address]  Folate     Status: None   Collection Time: 04/13/21  5:11 AM  Result Value Ref Range   Folate 14.3 >5.9 ng/mL    Comment: Performed at [facility], [address] [place],  [address]    DG Foot Complete Right  Result Date: 04/12/2021 CLINICAL DATA:  Right foot pain, infection EXAM: RIGHT FOOT COMPLETE - 3+ VIEW COMPARISON:  04/05/2021 FINDINGS: Frontal, oblique, and lateral views of the right foot are obtained. No fracture, subluxation, or dislocation. Ulceration within the lateral midfoot overlying the base of the fifth metatarsal. Minimal subcutaneous gas. No underlying bony destruction or periosteal reaction to suggest osteomyelitis. There is diffuse  soft tissue swelling throughout the remainder of the forefoot and midfoot. IMPRESSION: 1. Soft tissue ulceration lateral aspect midfoot, with no evidence of underlying osteomyelitis. 2. Diffuse soft tissue edema consistent with cellulitis. Electronically Signed   By: Randa Ngo M.D.   On: 04/12/2021 18:48    Review of Systems  Constitutional: Negative.   HENT: Negative.   Eyes: Negative.   Respiratory: Negative.   Cardiovascular: Negative.   Musculoskeletal:       Pain in right foot   Skin:       Ulcer right foot   Blood pressure 109/65, pulse 67, temperature 99.1 F (37.3 C), resp. rate 16, height 6' (1.829 m), weight 90.8 kg, SpO2 100 %.  Vitals:   04/13/21 0157 04/13/21 0529  BP: 134/74 109/65  Pulse: 66 67  Resp: 17 16  Temp: 98.6 F (37 C) 99.1 F (37.3 C)  SpO2: 96% 100%    General AA&O x3. Normal mood and affect.  Vascular Dorsalis pedis and posterior tibial pulses  present 1+ right.  Difficult to palpate pedal pulses he has significant edema currently Capillary refill normal to all digits. Pedal hair growth normal.  Neurologic Epicritic sensation grossly absent.  Dermatologic (Wound)  full-thickness ulceration right fifth metatarsal base, does not appear  to probe to bone, there is a purulent blister distally to this. Cellulitis and edema of the foot.  Orthopedic: Motor intact BLE.       Assessment/Plan:   -Imaging: Studies independently reviewed.  Recommend MRI of the right foot as well as noninvasive vascular imaging ABIs and PVRs -Antibiotics: Continue broad-spectrum antibiotics -WB Status: WBAT RLE -Wound Care: Daily dressing changes with gentamicin and Mepilex -Surgical Plan: TBD  Criselda Peaches 04/13/2021, 8:21 AM   Best available via secure chat for questions or concerns.

## 2021-04-13 NOTE — Plan of Care (Signed)
  Problem: Coping: Goal: Level of anxiety will decrease 04/13/2021 0314 by Jearld Fenton, RN Outcome: Progressing 04/13/2021 0314 by Jearld Fenton, RN Outcome: Progressing   Problem: Elimination: Goal: Will not experience complications related to bowel motility 04/13/2021 0314 by Jearld Fenton, RN Outcome: Progressing 04/13/2021 0314 by Jearld Fenton, RN Outcome: Progressing Goal: Will not experience complications related to urinary retention 04/13/2021 0314 by Jearld Fenton, RN Outcome: Progressing 04/13/2021 0314 by Jearld Fenton, RN Outcome: Progressing   Problem: Pain Managment: Goal: General experience of comfort will improve 04/13/2021 0314 by Jearld Fenton, RN Outcome: Progressing 04/13/2021 0314 by Jearld Fenton, RN Outcome: Progressing   Problem: Safety: Goal: Ability to remain free from injury will improve 04/13/2021 0314 by Jearld Fenton, RN Outcome: Progressing 04/13/2021 0314 by Jearld Fenton, RN Outcome: Progressing   Problem: Skin Integrity: Goal: Risk for impaired skin integrity will decrease 04/13/2021 0314 by Jearld Fenton, RN Outcome: Progressing 04/13/2021 0314 by Jearld Fenton, RN Outcome: Progressing

## 2021-04-13 NOTE — Progress Notes (Signed)
Pharmacy Antibiotic Note  [name] is a 55 y.o. male admitted on 04/12/2021 with cellulitis.  Pharmacy has been consulted for Vancomycin dosing.  Pt is also currently on Ceftriaxone 1 gm q24h.  Plan: Change vanc to 1000 mg IV q12h per improved renal function Goal AUC 400-550. Expected AUC: 443 SCr used: 1.19  Pharmacy will continue to follow and adjust abx dosing if warranted.   Height: 6' (182.9 cm) Weight: 90.8 kg (200 lb 2.8 oz) IBW/kg (Calculated) : 77.6  Temp (24hrs), Avg:98.7 F (37.1 C), Min:98.2 F (36.8 C), Max:99.1 F (37.3 C)  Recent Labs  Lab 04/12/21 1631 04/12/21 2339 04/13/21 0511  WBC 26.8*  --  25.6*  CREATININE 1.38*  --  1.19  LATICACIDVEN 1.8 1.1  --     Estimated Creatinine Clearance: 77.9 mL/min (by C-G formula based on SCr of 1.19 mg/dL).    No Known Allergies  Antimicrobials this admission: 5/3 Flagyl >> x 1 5/3 Cefepime >> x 1 5/4 Ceftriaxone >> 5/3 Vancomycin >>  Microbiology results: 5/4 Wound right foot pending  Thank you for allowing pharmacy to be a part of this patient's care.  Dorena Bodo, PharmD Clinical Pharmacist 04/13/2021 2:48 PM

## 2021-04-13 NOTE — Progress Notes (Signed)
[facility] Daily Progress Note  Consult received for possible angiogram. Patient was not in his room at the time of consult. Will make the patient n.p.o. in preparation for angiogram tomorrow.  Discussed with Dr. Ellis Parents Read Bonelli PA-C 04/13/2021 3:12 PM

## 2021-04-13 NOTE — Progress Notes (Signed)
Pharmacy Antibiotic Note  [name] is a 55 y.o. male admitted on 04/12/2021 with cellulitis.  Pharmacy has been consulted for Vancomycin dosing.  Pt is also currently on Ceftriaxone 1 gm q24h.  Plan: Pt given Vanc 1 gm in ER 5/3 @ 2029 Vancomycin 2000 mg IV Q 24 hrs.  Goal AUC 400-550. Expected AUC: 508.6 SCr used: 1.38  Pharmacy will continue to follow and adjust abx dosing if warranted.   Height: 6' (182.9 cm) Weight: 90.8 kg (200 lb 2.8 oz) IBW/kg (Calculated) : 77.6  Temp (24hrs), Avg:98.7 F (37.1 C), Min:98.6 F (37 C), Max:98.7 F (37.1 C)  Recent Labs  Lab 04/12/21 1631 04/12/21 2339  WBC 26.8*  --   CREATININE 1.38*  --   LATICACIDVEN 1.8 1.1    Estimated Creatinine Clearance: 67.2 mL/min (A) (by C-G formula based on SCr of 1.38 mg/dL (H)).    No Known Allergies  Antimicrobials this admission: 5/3 Flagyl >> x 1 5/3 Cefepime >> x 1 5/4 Ceftriaxone >> 5/3 Vancomycin >>  Microbiology results: No labs ordered at this time.  Thank you for allowing pharmacy to be a part of this patient's care.  Renda Rolls, PharmD, [facility] 04/13/2021 2:27 AM

## 2021-04-13 NOTE — Progress Notes (Signed)
Patient ID: [name], male   DOB: 1966/06/21, 55 y.o.   MRN: [id number]  PROGRESS NOTE    AADARSH COZORT  EHM:[id number] DOB: [birth year] DOA: 04/12/2021 PCP: [name], MD   Brief Narrative:   55 y.o. male with medical history significant for DM type II, HTN, PAD with history of left partial fifth metatarsal base resection July 2021 after presenting with gangrene presented with worsening right foot pain and worsening foot ulcer despite undergoing excisional debridement on 04/05/2021 by podiatry and oral doxycycline.  On presentation, he had WBCs of 26,000 with creatinine of 1.38, up from 1.02 which is his baseline.  X-ray of the right foot showed soft tissue ulceration of lateral aspect of midfoot with no evidence of underlying osteomyelitis with diffuse soft tissue edema.  He was started on broad-spectrum antibiotics.  Podiatry and vascular surgery have been consulted.  Assessment & Plan:   Cellulitis of right foot/diabetic right foot ulcer infection PAD with history of left metatarsal base amputation Leukocytosis -Patient presented with worsening right foot infected ulcer despite undergoing wound debridement on 04/05/2021 and outpatient doxycycline -Currently on broad-spectrum antibiotics Rocephin and vancomycin.  Will add Flagyl. -Podiatry and vascular surgery consult.  Wound care. -Continue rosuvastatin for PAD.  Not on antiplatelets -Still has significant leukocytosis.  Monitor  Diabetes mellitus type II with hyperglycemia -A1c 9.9.  Continue CBGs with SSI.  Hypertension -Monitor blood pressure.  Continue irbesartan/hydrochlorothiazide/nebivolol  Acute kidney injury -Presented with creatinine of 1.38: Baseline of around 1 to 2 months ago.  Treated with IV fluids.  Resolved.  Anemia -Unknown cause.  Hemoglobin stable.  Outpatient follow-up.   DVT prophylaxis: SCDs Code Status: Full Family Communication: None at bedside Disposition Plan: Status is: Inpatient  Remains  inpatient appropriate because:Inpatient level of care appropriate due to severity of illness   Dispo: The patient is from: Home              Anticipated d/c is to: Home              Patient currently is not medically stable to d/c.   Difficult to place patient No  Consultants: Podiatry/vascular surgery  Procedures: None  Antimicrobials: Rocephin and vancomycin from 04/12/2021 onwards   Subjective: Patient seen and examined at bedside.  Denies overnight fever, vomiting or abdominal pain.  Complains of intermittent right foot pain but improving since admission.  Objective: Vitals:   04/13/21 0100 04/13/21 0116 04/13/21 0157 04/13/21 0529  BP: (!) 113/54  134/74 109/65  Pulse: 67  66 67  Resp: (!) 21  17 16   Temp:   98.6 F (37 C) 99.1 F (37.3 C)  TempSrc:      SpO2: 98%  96% 100%  Weight:  90.8 kg    Height:  6' (1.829 m)      Intake/Output Summary (Last 24 hours) at 04/13/2021 1009 Last data filed at 04/13/2021 0500 Gross per 24 hour  Intake --  Output 900 ml  Net -900 ml   Filed Weights   04/13/21 0116  Weight: 90.8 kg    Examination:  General exam: Appears calm and comfortable  Respiratory system: Bilateral decreased breath sounds at bases Cardiovascular system: S1 & S2 heard, Rate controlled Gastrointestinal system: Abdomen is nondistended, soft and nontender. Normal bowel sounds heard. Extremities: No cyanosis, clubbing; trace lower extremity edema Central nervous system: Alert and oriented. No focal neurological deficits. Moving extremities Skin: No obvious petechiae/lesions.  Right foot ulcer  psychiatry: Judgement and insight appear normal.  Mood & affect appropriate.     Data Reviewed: I have personally reviewed following labs and imaging studies  CBC: Recent Labs  Lab 04/12/21 1631 04/13/21 0511  WBC 26.8* 25.6*  NEUTROABS 23.7*  --   HGB 11.1* 9.7*  HCT 33.5* 29.6*  MCV 91.5 92.8  PLT 420* 416   Basic Metabolic Panel: Recent Labs  Lab  04/12/21 1631 04/13/21 0511  NA 132* 135  K 3.9 3.6  CL 95* 101  CO2 18* 18*  GLUCOSE 417* 236*  BUN 53* 50*  CREATININE 1.38* 1.19  CALCIUM 9.3 8.6*   GFR: Estimated Creatinine Clearance: 77.9 mL/min (by C-G formula based on SCr of 1.19 mg/dL). Liver Function Tests: Recent Labs  Lab 04/12/21 1631 04/13/21 0511  AST 12* 8*  ALT 11 9  ALKPHOS 86 75  BILITOT 3.7* 2.6*  PROT 8.0 6.6  ALBUMIN 2.7* 2.0*   No results for input(s): LIPASE, AMYLASE in the last 168 hours. No results for input(s): AMMONIA in the last 168 hours. Coagulation Profile: No results for input(s): INR, PROTIME in the last 168 hours. Cardiac Enzymes: No results for input(s): CKTOTAL, CKMB, CKMBINDEX, TROPONINI in the last 168 hours. BNP (last 3 results) No results for input(s): PROBNP in the last 8760 hours. HbA1C: Recent Labs    04/12/21 2339  HGBA1C 9.9*   CBG: Recent Labs  Lab 04/12/21 2323 04/13/21 0821  GLUCAP 295* 217*   Lipid Profile: No results for input(s): CHOL, HDL, LDLCALC, TRIG, CHOLHDL, LDLDIRECT in the last 72 hours. Thyroid Function Tests: No results for input(s): TSH, T4TOTAL, FREET4, T3FREE, THYROIDAB in the last 72 hours. Anemia Panel: Recent Labs    04/13/21 0511  FOLATE 14.3  FERRITIN 1,118*  TIBC 168*  IRON 30*   Sepsis Labs: Recent Labs  Lab 04/12/21 1631 04/12/21 2339  LATICACIDVEN 1.8 1.1    No results found for this or any previous visit (from the past 240 hour(s)).       Radiology Studies: DG Foot Complete Right  Result Date: 04/12/2021 CLINICAL DATA:  Right foot pain, infection EXAM: RIGHT FOOT COMPLETE - 3+ VIEW COMPARISON:  04/05/2021 FINDINGS: Frontal, oblique, and lateral views of the right foot are obtained. No fracture, subluxation, or dislocation. Ulceration within the lateral midfoot overlying the base of the fifth metatarsal. Minimal subcutaneous gas. No underlying bony destruction or periosteal reaction to suggest osteomyelitis. There is  diffuse soft tissue swelling throughout the remainder of the forefoot and midfoot. IMPRESSION: 1. Soft tissue ulceration lateral aspect midfoot, with no evidence of underlying osteomyelitis. 2. Diffuse soft tissue edema consistent with cellulitis. Electronically Signed   By: Randa Ngo M.D.   On: 04/12/2021 18:48        Scheduled Meds: . gentamicin ointment   Topical BID  . irbesartan  300 mg Oral Daily   And  . hydrochlorothiazide  25 mg Oral Daily  . insulin aspart  0-15 Units Subcutaneous TID WC  . insulin aspart  0-5 Units Subcutaneous QHS  . nebivolol  5 mg Oral Daily  . rosuvastatin  40 mg Oral Daily   Continuous Infusions: . sodium chloride 75 mL/hr at 04/13/21 0859  . cefTRIAXone (ROCEPHIN)  IV 1 g (04/13/21 0404)  . vancomycin 2,000 mg (04/13/21 0251)          Aline August, MD [facility] 04/13/2021, 10:09 AM

## 2021-04-14 ENCOUNTER — Encounter: Payer: Self-pay | Admitting: Podiatry

## 2021-04-14 ENCOUNTER — Encounter: Payer: Self-pay | Admitting: Internal Medicine

## 2021-04-14 ENCOUNTER — Encounter
Admission: EM | Disposition: A | Discharge status: Home Health | Payer: Self-pay | Source: Home / Self Care | Attending: Internal Medicine

## 2021-04-14 DIAGNOSIS — I739 Peripheral vascular disease, unspecified: Secondary | ICD-10-CM

## 2021-04-14 DIAGNOSIS — I96 Gangrene, not elsewhere classified: Secondary | ICD-10-CM

## 2021-04-14 DIAGNOSIS — L97509 Non-pressure chronic ulcer of other part of unspecified foot with unspecified severity: Secondary | ICD-10-CM

## 2021-04-14 HISTORY — PX: LOWER EXTREMITY ANGIOGRAPHY: CATH118251

## 2021-04-14 LAB — BASIC METABOLIC PANEL
Anion gap: 12 (ref 5–15)
BUN: 42 mg/dL — ABNORMAL HIGH (ref 6–20)
CO2: 20 mmol/L — ABNORMAL LOW (ref 22–32)
Calcium: 8.6 mg/dL — ABNORMAL LOW (ref 8.9–10.3)
Chloride: 101 mmol/L (ref 98–111)
Creatinine, Ser: 1.05 mg/dL (ref 0.61–1.24)
GFR, Estimated: >60 mL/min (ref >60)
Glucose, Bld: 171 mg/dL — ABNORMAL HIGH (ref 70–99)
Potassium: 3.1 mmol/L — ABNORMAL LOW (ref 3.5–5.1)
Sodium: 133 mmol/L — ABNORMAL LOW (ref 135–145)

## 2021-04-14 LAB — CBC WITH DIFFERENTIAL/PLATELET
Abs Immature Granulocytes: 0.66 10*3/uL — ABNORMAL HIGH (ref 0.00–0.07)
Basophils Absolute: 0.1 10*3/uL (ref 0.0–0.1)
Basophils Relative: 0 %
Eosinophils Absolute: 0 10*3/uL (ref 0.0–0.5)
Eosinophils Relative: 0 %
HCT: 27.5 % — ABNORMAL LOW (ref 39.0–52.0)
Hemoglobin: 9.1 g/dL — ABNORMAL LOW (ref 13.0–17.0)
Immature Granulocytes: 2 %
Lymphocytes Relative: 3 %
Lymphs Abs: 0.9 10*3/uL (ref 0.7–4.0)
MCH: 30.3 pg (ref 26.0–34.0)
MCHC: 33.1 g/dL (ref 30.0–36.0)
MCV: 91.7 fL (ref 80.0–100.0)
Monocytes Absolute: 2.3 10*3/uL — ABNORMAL HIGH (ref 0.1–1.0)
Monocytes Relative: 8 %
Neutro Abs: 24.9 10*3/uL — ABNORMAL HIGH (ref 1.7–7.7)
Neutrophils Relative %: 87 %
Platelets: 361 10*3/uL (ref 150–400)
RBC: 3 MIL/uL — ABNORMAL LOW (ref 4.22–5.81)
RDW: 11.6 % (ref 11.5–15.5)
Smear Review: NORMAL
WBC: 28.8 10*3/uL — ABNORMAL HIGH (ref 4.0–10.5)
nRBC: 0 % (ref 0.0–0.2)

## 2021-04-14 LAB — GLUCOSE, CAPILLARY
Glucose-Capillary: 181 mg/dL — ABNORMAL HIGH (ref 70–99)
Glucose-Capillary: 159 mg/dL — ABNORMAL HIGH (ref 70–99)
Glucose-Capillary: 145 mg/dL — ABNORMAL HIGH (ref 70–99)
Glucose-Capillary: 131 mg/dL — ABNORMAL HIGH (ref 70–99)
Glucose-Capillary: 143 mg/dL — ABNORMAL HIGH (ref 70–99)

## 2021-04-14 LAB — MAGNESIUM: Magnesium: 2.2 mg/dL (ref 1.7–2.4)

## 2021-04-14 LAB — C-REACTIVE PROTEIN: CRP: 25.4 mg/dL — ABNORMAL HIGH (ref <1.0)

## 2021-04-14 SURGERY — LOWER EXTREMITY ANGIOGRAPHY
Anesthesia: Moderate Sedation | Laterality: Right

## 2021-04-14 MED ORDER — HEPARIN SODIUM (PORCINE) 1000 UNIT/ML IJ SOLN
INTRAMUSCULAR | Status: AC
  Filled 2021-04-14: qty 1

## 2021-04-14 MED ORDER — HYDROMORPHONE HCL 1 MG/ML IJ SOLN: 1.0000 mg | Freq: Once | INTRAMUSCULAR | Status: DC | PRN

## 2021-04-14 MED ORDER — MIDAZOLAM HCL 5 MG/5ML IJ SOLN
INTRAMUSCULAR | Status: AC
  Filled 2021-04-14: qty 5

## 2021-04-14 MED ORDER — MIDAZOLAM HCL 2 MG/2ML IJ SOLN
INTRAMUSCULAR | Status: DC | PRN
  Administered 2021-04-14: 1 mg via INTRAVENOUS

## 2021-04-14 MED ORDER — CEFAZOLIN SODIUM-DEXTROSE 2-4 GM/100ML-% IV SOLN: 2.0000 g | Freq: Once | INTRAVENOUS | Status: DC

## 2021-04-14 MED ORDER — CEFAZOLIN SODIUM-DEXTROSE 2-4 GM/100ML-% IV SOLN
INTRAVENOUS | Status: AC
  Filled 2021-04-14: qty 100

## 2021-04-14 MED ORDER — FENTANYL CITRATE (PF) 100 MCG/2ML IJ SOLN
INTRAMUSCULAR | Status: AC
  Filled 2021-04-14: qty 2

## 2021-04-14 MED ORDER — INSULIN DETEMIR 100 UNIT/ML ~~LOC~~ SOLN
15.0000 [IU] | Freq: Every day | SUBCUTANEOUS | Status: DC
  Administered 2021-04-14 – 2021-04-17 (×4): 15 [IU] via SUBCUTANEOUS
  Filled 2021-04-14 (×5): qty 0.15

## 2021-04-14 MED ORDER — METHYLPREDNISOLONE SODIUM SUCC 125 MG IJ SOLR: 125.0000 mg | Freq: Once | INTRAMUSCULAR | Status: DC | PRN

## 2021-04-14 MED ORDER — IODIXANOL 320 MG/ML IV SOLN
INTRAVENOUS | Status: DC | PRN
  Administered 2021-04-14: 35 mL via INTRA_ARTERIAL

## 2021-04-14 MED ORDER — POTASSIUM CHLORIDE 10 MEQ/100ML IV SOLN
10.0000 meq | INTRAVENOUS | Status: DC
  Administered 2021-04-14 (×3): 10 meq via INTRAVENOUS
  Filled 2021-04-14 (×3): qty 100

## 2021-04-14 MED ORDER — FENTANYL CITRATE (PF) 100 MCG/2ML IJ SOLN
INTRAMUSCULAR | Status: DC | PRN
  Administered 2021-04-14: 50 ug via INTRAVENOUS

## 2021-04-14 MED ORDER — SODIUM CHLORIDE 0.9 % IV SOLN: INTRAVENOUS | Status: DC

## 2021-04-14 MED ORDER — ONDANSETRON HCL 4 MG/2ML IJ SOLN: 4.0000 mg | Freq: Four times a day (QID) | INTRAMUSCULAR | Status: DC | PRN

## 2021-04-14 MED ORDER — DIPHENHYDRAMINE HCL 50 MG/ML IJ SOLN: 50.0000 mg | Freq: Once | INTRAMUSCULAR | Status: DC | PRN

## 2021-04-14 MED ORDER — FAMOTIDINE 20 MG PO TABS: 40.0000 mg | ORAL_TABLET | Freq: Once | ORAL | Status: DC | PRN

## 2021-04-14 MED ORDER — MIDAZOLAM HCL 2 MG/ML PO SYRP: 8.0000 mg | ORAL_SOLUTION | Freq: Once | ORAL | Status: DC | PRN

## 2021-04-14 MED ORDER — POTASSIUM CHLORIDE CRYS ER 20 MEQ PO TBCR
40.0000 meq | EXTENDED_RELEASE_TABLET | Freq: Once | ORAL | Status: AC
  Administered 2021-04-14: 40 meq via ORAL
  Filled 2021-04-14: qty 2

## 2021-04-14 SURGICAL SUPPLY — 9 items
CATH ANGIO 5F PIGTAIL 65CM (CATHETERS) ×2 IMPLANT
COVER PROBE U/S 5X48 (MISCELLANEOUS) ×2 IMPLANT
DEVICE STARCLOSE SE CLOSURE (Vascular Products) ×2 IMPLANT
GLIDEWIRE ANGLED SS 035X260CM (WIRE) ×2 IMPLANT
PACK ANGIOGRAPHY (CUSTOM PROCEDURE TRAY) ×2 IMPLANT
SHEATH BRITE TIP 5FRX11 (SHEATH) ×2 IMPLANT
SYR MEDRAD MARK 7 150ML (SYRINGE) ×2 IMPLANT
TUBING CONTRAST HIGH PRESS 48 (TUBING) ×4 IMPLANT
WIRE GUIDERIGHT .035X150 (WIRE) ×2 IMPLANT

## 2021-04-14 NOTE — Progress Notes (Signed)
Patient ID: [name], male   DOB: 1966-03-02, 55 y.o.   MRN: [id number]  PROGRESS NOTE    [name]  JAS:[id number] DOB: [birth year] DOA: 04/12/2021 PCP: [name], MD   Brief Narrative:   55 y.o. male with medical history significant for DM type II, HTN, PAD with history of left partial fifth metatarsal base resection July 2021 after presenting with gangrene presented with worsening right foot pain and worsening foot ulcer despite undergoing excisional debridement on 04/05/2021 by podiatry and oral doxycycline.  On presentation, he had WBCs of 26,000 with creatinine of 1.38, up from 1.02 which is his baseline.  X-ray of the right foot showed soft tissue ulceration of lateral aspect of midfoot with no evidence of underlying osteomyelitis with diffuse soft tissue edema.  He was started on broad-spectrum antibiotics.  Podiatry and vascular surgery have been consulted.  Assessment & Plan:   Cellulitis of right foot/diabetic right foot ulcer infection with osteomyelitis PAD with history of left metatarsal base amputation Leukocytosis -Patient presented with worsening right foot infected ulcer despite undergoing wound debridement on 04/05/2021 and outpatient doxycycline -Currently on broad-spectrum antibiotics Rocephin, Flagyl and vancomycin.   -MRI of the right foot is consistent with osteomyelitis.  Podiatry and vascular surgery following and vascular surgery planning for possible angiogram today. -T-max of 101.4 over the last 24 hours.  We will get blood cultures as well. -Continue rosuvastatin for PAD.  Not on antiplatelets -Still has significant leukocytosis.  Monitor  Diabetes mellitus type II with hyperglycemia -A1c 9.9.  Continue CBGs with SSI.  Increase Levemir to 15 units daily  Hypertension -Monitor blood pressure.  Continue irbesartan/hydrochlorothiazide/nebivolol  Acute kidney injury -Presented with creatinine of 1.38: Baseline of around 1 to 2 months ago.  Treated with  IV fluids.  Resolved.  Anemia -Unknown cause.  Hemoglobin stable.  Outpatient follow-up.  Hypokalemia -Replace.  Repeat a.m. labs   DVT prophylaxis: SCDs Code Status: Full Family Communication: Wife at bedside on 04/14/2021 Disposition Plan: Status is: Inpatient  Remains inpatient appropriate because:Inpatient level of care appropriate due to severity of illness   Dispo: The patient is from: Home              Anticipated d/c is to: Home              Patient currently is not medically stable to d/c.   Difficult to place patient No  Consultants: Podiatry/vascular surgery  Procedures: None  Antimicrobials: Rocephin and vancomycin from 04/12/2021 onwards.  Flagyl from 04/13/2021 onwards   Subjective: Patient seen and examined at bedside.  Had fevers yesterday.  Still complains of intermittent right foot pain.  No overnight worsening shortness of breath, abdominal pain or chest pain reported.  Objective: Vitals:   04/13/21 1542 04/13/21 1941 04/14/21 0021 04/14/21 0502  BP: 138/67 113/60 122/62 (!) 116/53  Pulse: 78 75 78 78  Resp: 16 18 17 18   Temp: (!) 101.4 F (38.6 C) 99.6 F (37.6 C) (!) 100.7 F (38.2 C) 99.8 F (37.7 C)  TempSrc: Oral     SpO2: 96% 97% 97% 98%  Weight:      Height:        Intake/Output Summary (Last 24 hours) at 04/14/2021 0729 Last data filed at 04/14/2021 0538 Gross per 24 hour  Intake 2537.73 ml  Output 1000 ml  Net 1537.73 ml   Filed Weights   04/13/21 0116  Weight: 90.8 kg    Examination:  General exam: No acute distress.  Currently on room air. Respiratory system: Decreased breath sounds at bases bilaterally, no wheezing cardiovascular system: Rate controlled, S1-S2 heard  gastrointestinal system: Abdomen is slightly distended, soft and nontender.  Bowel sounds are heard  extremities: Mild lower extremity edema present; no clubbing Central nervous system: Awake and alert.  No focal neurological deficits.  Moves extremities  skin: No  obvious ecchymosis/other rashes right foot ulcer present  psychiatry: Flat affect    Data Reviewed: I have personally reviewed following labs and imaging studies  CBC: Recent Labs  Lab 04/12/21 1631 04/13/21 0511 04/14/21 0547  WBC 26.8* 25.6* 28.8*  NEUTROABS 23.7*  --  PENDING  HGB 11.1* 9.7* 9.1*  HCT 33.5* 29.6* 27.5*  MCV 91.5 92.8 91.7  PLT 420* 343 557   Basic Metabolic Panel: Recent Labs  Lab 04/12/21 1631 04/13/21 0511 04/14/21 0547  NA 132* 135 133*  K 3.9 3.6 3.1*  CL 95* 101 101  CO2 18* 18* 20*  GLUCOSE 417* 236* 171*  BUN 53* 50* 42*  CREATININE 1.38* 1.19 1.05  CALCIUM 9.3 8.6* 8.6*  MG  --   --  2.2   GFR: Estimated Creatinine Clearance: 88.3 mL/min (by C-G formula based on SCr of 1.05 mg/dL). Liver Function Tests: Recent Labs  Lab 04/12/21 1631 04/13/21 0511  AST 12* 8*  ALT 11 9  ALKPHOS 86 75  BILITOT 3.7* 2.6*  PROT 8.0 6.6  ALBUMIN 2.7* 2.0*   No results for input(s): LIPASE, AMYLASE in the last 168 hours. No results for input(s): AMMONIA in the last 168 hours. Coagulation Profile: No results for input(s): INR, PROTIME in the last 168 hours. Cardiac Enzymes: No results for input(s): CKTOTAL, CKMB, CKMBINDEX, TROPONINI in the last 168 hours. BNP (last 3 results) No results for input(s): PROBNP in the last 8760 hours. HbA1C: Recent Labs    04/12/21 2339  HGBA1C 9.9*   CBG: Recent Labs  Lab 04/12/21 2323 04/13/21 0821 04/13/21 1135 04/13/21 1633 04/13/21 2119  GLUCAP 295* 217* 259* 228* 235*   Lipid Profile: No results for input(s): CHOL, HDL, LDLCALC, TRIG, CHOLHDL, LDLDIRECT in the last 72 hours. Thyroid Function Tests: No results for input(s): TSH, T4TOTAL, FREET4, T3FREE, THYROIDAB in the last 72 hours. Anemia Panel: Recent Labs    04/13/21 0511  VITAMINB12 231  FOLATE 14.3  FERRITIN 1,118*  TIBC 168*  IRON 30*   Sepsis Labs: Recent Labs  Lab 04/12/21 1631 04/12/21 2339  LATICACIDVEN 1.8 1.1     Recent Results (from the past 240 hour(s))  SARS CORONAVIRUS 2 (TAT 6-24 HRS) Nasopharyngeal Nasopharyngeal Swab     Status: None   Collection Time: 04/12/21  6:30 PM   Specimen: Nasopharyngeal Swab  Result Value Ref Range Status   SARS Coronavirus 2 NEGATIVE NEGATIVE Final    Comment: (NOTE) SARS-CoV-2 target nucleic acids are NOT DETECTED.  The SARS-CoV-2 RNA is generally detectable in upper and lower respiratory specimens during the acute phase of infection. Negative results do not preclude SARS-CoV-2 infection, do not rule out co-infections with other pathogens, and should not be used as the sole basis for treatment or other patient management decisions. Negative results must be combined with clinical observations, patient history, and epidemiological information. The expected result is Negative.  Fact Sheet for Patients: SugarRoll.be  Fact Sheet for Healthcare Providers: https://www.woods-mathews.com/  This test is not yet approved or cleared by the [place] FDA and  has been authorized for detection and/or diagnosis of SARS-CoV-2 by FDA under an Emergency  Use Authorization (EUA). This EUA will remain  in effect (meaning this test can be used) for the duration of the COVID-19 declaration under Se ction 564(b)(1) of the Act, 21 U.S.C. section 360bbb-3(b)(1), unless the authorization is terminated or revoked sooner.  Performed at [facility], [place] [address], [place], [place] [address]   Aerobic Culture w Gram Stain (superficial specimen)     Status: None (Preliminary result)   Collection Time: 04/13/21  8:56 AM   Specimen: Wound  Result Value Ref Range Status   Specimen Description WOUND RIGHT FOOT  Final   Special Requests NONE  Final   Gram Stain   Final    FEW WBC PRESENT,BOTH PMN AND MONONUCLEAR FEW GRAM POSITIVE COCCI RARE GRAM VARIABLE ROD Performed at [facility], [place] [address], [place],  [place] [address]    Culture PENDING  Incomplete   Report Status PENDING  Incomplete         Radiology Studies: MR FOOT RIGHT W WO CONTRAST  Result Date: 04/13/2021 CLINICAL DATA:  Diabetic patient with a skin ulceration on the lateral aspect of the right foot. Elevated white blood cell count. EXAM: MRI OF THE RIGHT FOREFOOT WITHOUT AND WITH CONTRAST TECHNIQUE: Multiplanar, multisequence MR imaging of the right forefoot was performed before and after the administration of intravenous contrast. CONTRAST:  9 mL GADAVIST IV SOLN COMPARISON:  Plain films right foot 04/12/2021. FINDINGS: Bones/Joint/Cartilage There is mild marrow edema and enhancement the lateral aspect of the proximal 2 cm of the fifth metatarsal consistent with osteomyelitis. Signal change and enhancement are subjacent to a skin ulceration. Bone marrow signal is otherwise normal. No joint effusion is identified. Ligaments Intact. Muscles and Tendons Intact. No intramuscular fluid collection. Intermediate increased T2 signal in intrinsic musculature of the foot is most consistent with diabetic myopathy. Soft tissues Skin ulceration is seen at the base of the fifth metatarsal. No underlying abscess. There is a small amount of gas in soft tissues subjacent to the wound. Subcutaneous edema and enhancement about the foot are consistent with cellulitis. IMPRESSION: Skin ulceration just peripheral to the base of the fifth metatarsal with signal change and enhancement in the proximal 2 cm of the fifth metatarsal consistent with osteomyelitis. Negative for abscess or septic joint. Subcutaneous edema and enhancement about the foot most consistent with cellulitis. Electronically Signed   By: Inge Rise M.D.   On: 04/13/2021 16:12   DG Foot Complete Right  Result Date: 04/12/2021 CLINICAL DATA:  Right foot pain, infection EXAM: RIGHT FOOT COMPLETE - 3+ VIEW COMPARISON:  04/05/2021 FINDINGS: Frontal, oblique, and lateral views of the right foot are  obtained. No fracture, subluxation, or dislocation. Ulceration within the lateral midfoot overlying the base of the fifth metatarsal. Minimal subcutaneous gas. No underlying bony destruction or periosteal reaction to suggest osteomyelitis. There is diffuse soft tissue swelling throughout the remainder of the forefoot and midfoot. IMPRESSION: 1. Soft tissue ulceration lateral aspect midfoot, with no evidence of underlying osteomyelitis. 2. Diffuse soft tissue edema consistent with cellulitis. Electronically Signed   By: Randa Ngo M.D.   On: 04/12/2021 18:48        Scheduled Meds: . gentamicin ointment   Topical BID  . irbesartan  300 mg Oral Daily   And  . hydrochlorothiazide  25 mg Oral Daily  . insulin aspart  0-15 Units Subcutaneous TID WC  . insulin aspart  0-5 Units Subcutaneous QHS  . insulin detemir  10 Units Subcutaneous Daily  .  metroNIDAZOLE  500 mg Oral Q8H  . nebivolol  5 mg Oral Daily  . rosuvastatin  40 mg Oral Daily   Continuous Infusions: . sodium chloride 75 mL/hr at 04/14/21 0538  . sodium chloride    . cefTRIAXone (ROCEPHIN)  IV Stopped (04/14/21 0531)  . vancomycin Stopped (04/14/21 0252)          Aline August, MD [facility] 04/14/2021, 7:29 AM

## 2021-04-14 NOTE — Progress Notes (Signed)
Inpatient Diabetes Program Recommendations  AACE/ADA: New Consensus Statement on Inpatient Glycemic Control (2015)  Target Ranges:  Prepandial:   less than 140 mg/dL      Peak postprandial:   less than 180 mg/dL (1-2 hours)      Critically ill patients:  140 - 180 mg/dL   Results for [name], [name] (MRN [id number]) as of 04/14/2021 07:35  Ref. Range 02/09/2021 10:56 04/12/2021 23:39  Hemoglobin A1C Latest Ref Range: 4.8 - 5.6 % 11.7 (A) 9.9 (H)  (237 mg/dl)   Results for [name], [name] (MRN [id number]) as of 04/14/2021 07:35  Ref. Range 04/13/2021 08:21 04/13/2021 11:35 04/13/2021 16:33 04/13/2021 21:19  Glucose-Capillary Latest Ref Range: 70 - 99 mg/dL 217 (H)  5 units NOVOLOG  259 (H)  8 units NOVOLOG  10 units LEVEMIR  228 (H)  5 units NOVOLOG  235 (H)  2 units NOVOLOG    Results for [name], [name] (MRN [id number]) as of 04/14/2021 12:06  Ref. Range 04/14/2021 07:54 04/14/2021 12:03  Glucose-Capillary Latest Ref Range: 70 - 99 mg/dL 181 (H) 159 (H)    Admit Cellulitis/ Diabetes foot ulcer/ Hyperglycemia  History: DM   Home DM Meds: Jardiance 25 mg Daily        Glipizide 10 mg Daily       Actos 15 mg Daily  Current Orders: Novolog 0-15 units TID AC + HS      Levemir 15 units Daily   Current A1c= 9.9% (down from 11.7% in March 2022)--PCP listed as Dr. Ancil Boozer with [facility]--last seen 02/09/2021--Per PCP notes, was off meds for some time--meds restarted at that visit--likely why A1c down to 9.9%   Started Levemir yest AM    Addendum 10:30am--Met w/ pt at bedside this AM to discuss his foot wound and current A1c of 9.9%.  Pt told me he has been on the Jardiance, Glipizide, and Actos for several years and has not noticed any change in his CBG levels.  Knows he needs better control to heal his foot and prevent further complications.  Is open to using insulin at home, however, cost is a concern to him.  Pt told me he is on his wife's insurance, however, they pay over $400 per month for their  benefits and he would be concerned about adding an expensive insulin to their expenses.  I did show pt how to use an insulin pen while I was with him in his room.  Reviewed all steps of insulin pen including attachment of needle, 2-unit air shot, dialing up dose, giving injection, rotation of injection sites, removing needle, disposal of sharps, storage of unused insulin, disposal of insulin etc.  Patient able to provide successful return demonstration.  Reviewed troubleshooting with insulin pen.    Pt currently getting Levemir.  It looks like Levemir is not covered by pt's insurance?  I will place [facility] consult and see if they can perform benefits check to see what the cost of Levemir insulin or Lantus insulin would be.    --Will follow patient during hospitalization--  Wyn Quaker RN, MSN, CDE Diabetes Coordinator Inpatient Glycemic Control Team Team Pager: [phone] (8a-5p)

## 2021-04-14 NOTE — Consult Note (Signed)
[facility] Consult Note  MRN : [id number]  [name] is a 55 y.o. (1965-12-27) male who presents with chief complaint of  Chief Complaint  Patient presents with  . Foot Pain   History of Present Illness: [name] is a 55 year old male with medical history significant for DM, HTN, PAD with history of left partial fifth metatarsal base resection July 2021 after presenting with gangrene who presents to the emergency room with progressive worsening of an ulcer of the base of the right foot similar to presentation a year prior with the left foot.    It is painful with walking. He saw podiatry on 4/26 and underwent excisional debridement and was prescribed doxycycline however he states that the wound appears to be getting worse and he is having increased pain in the foot as well as swelling. He denies fever or chills. Denies nausea vomiting or myalgias. Denies cough or shortness of breath and denies rash.  On arrival: afebrile, BP 148/77, pulse 81 with O2 sat 96% on room air. Leukocytosis of 26,000 with normal lactic acid of 1.8. Hemoglobin 11.1, down from 14.7 a couple months prior. Creatinine 1.38 up from 1.02, his baseline.  Other abnormalities include mild hyponatremia of 132, elevated blood sugar 417 with anion gap of 19 and elevated total bilirubin of 3.7.  Imaging:  Right foot x-ray showing soft tissue ulceration lateral aspect midfoot with no evidence of underlying osteomyelitis with diffuse soft tissue edema.  Vascular surgery was consulted by Dr. Starla Link for possible endovascular intervention in the setting of chronic nonhealing wound.  Current Facility-Administered Medications  Medication Dose Route Frequency Provider Last Rate Last Admin  . 0.9 %  sodium chloride infusion   Intravenous Continuous Aline August, MD 75 mL/hr at 04/14/21 0538 Infusion Verify at 04/14/21 0538  . 0.9 %  sodium chloride infusion   Intravenous Continuous Deadra Diggins,  Janalyn Harder, PA-C 75 mL/hr at 04/14/21 0851 New Bag at 04/14/21 0851  . acetaminophen (TYLENOL) tablet 650 mg  650 mg Oral Q6H PRN Athena Masse, MD   650 mg at 04/13/21 1548   Or  . acetaminophen (TYLENOL) suppository 650 mg  650 mg Rectal Q6H PRN Athena Masse, MD      . cefTRIAXone (ROCEPHIN) 1 g in sodium chloride 0.9 % 100 mL IVPB  1 g Intravenous Q24H Athena Masse, MD   Stopped at 04/14/21 0531  . gentamicin ointment (GARAMYCIN) 0.1 %   Topical BID Criselda Peaches, [name]   Given at 04/13/21 2247  . irbesartan (AVAPRO) tablet 300 mg  300 mg Oral Daily Renda Rolls, RPH   300 mg at 04/13/21 5397   And  . hydrochlorothiazide (HYDRODIURIL) tablet 25 mg  25 mg Oral Daily Renda Rolls, RPH   25 mg at 04/13/21 6734  . HYDROcodone-acetaminophen (NORCO/VICODIN) 5-325 MG per tablet 1-2 tablet  1-2 tablet Oral Q4H PRN Athena Masse, MD      . insulin aspart (novoLOG) injection 0-15 Units  0-15 Units Subcutaneous TID WC Athena Masse, MD   5 Units at 04/13/21 1819  . insulin aspart (novoLOG) injection 0-5 Units  0-5 Units Subcutaneous QHS Athena Masse, MD   2 Units at 04/13/21 2246  . insulin detemir (LEVEMIR) injection 15 Units  15 Units Subcutaneous Daily Alekh, Kshitiz, MD      . metroNIDAZOLE (FLAGYL) tablet 500 mg  500 mg Oral Q8H Alekh, Kshitiz, MD   500 mg  at 04/14/21 0552  . nebivolol (BYSTOLIC) tablet 5 mg  5 mg Oral Daily Athena Masse, MD   5 mg at 04/13/21 0911  . ondansetron (ZOFRAN) tablet 4 mg  4 mg Oral Q6H PRN Athena Masse, MD       Or  . ondansetron [facility]) injection 4 mg  4 mg Intravenous Q6H PRN Judd Gaudier V, MD      . potassium chloride 10 mEq in 100 mL IVPB  10 mEq Intravenous Q1 Hr x 6 Alekh, Kshitiz, MD      . rosuvastatin (CRESTOR) tablet 40 mg  40 mg Oral Daily Athena Masse, MD   40 mg at 04/13/21 0912  . vancomycin (VANCOREADY) IVPB 1000 mg/200 mL  1,000 mg Intravenous Q12H Aline August, MD   Stopped at 04/14/21 0252   Past Medical History:   Diagnosis Date  . Diabetes mellitus without complication ([place])   . Hyperlipidemia   . Hypertension    Past Surgical History:  Procedure Laterality Date  . BONE BIOPSY Left 06/27/2020   Procedure: BONE BIOPSY;  Surgeon: Caroline More, [name];  Location: [facility];  Service: Podiatry;  Laterality: Left;  . INCISION AND DRAINAGE Left 06/27/2020   Procedure: INCISION AND DRAINAGE;  Surgeon: Caroline More, [name];  Location: [facility];  Service: Podiatry;  Laterality: Left;  . LOWER EXTREMITY ANGIOGRAPHY Left 06/30/2020   Procedure: Lower Extremity Angiography;  Surgeon: Algernon Huxley, MD;  Location: [facility];  Service: Cardiovascular;  Laterality: Left;  . NO PAST SURGERIES     Social History Social History   Tobacco Use  . Smoking status: Never Smoker  . Smokeless tobacco: Never Used  Vaping Use  . Vaping Use: Never used  Substance Use Topics  . Alcohol use: Yes    Alcohol/week: 6.0 - 12.0 standard drinks    Types: 6 - 12 Cans of beer per week    Comment: Occasional  . Drug use: No   Family History Family History  Problem Relation Age of Onset  . Heart disease Father   . Heart attack Father   . Healthy Mother   . Prostate cancer Neg Hx   . Kidney cancer Neg Hx   . Bladder Cancer Neg Hx   Denies family history of peripheral disease, venous disease or renal disease.  No Known Allergies  REVIEW OF SYSTEMS (Negative unless checked)  Constitutional: [] Weight loss  [] Fever  [] Chills Cardiac: [] Chest pain   [] Chest pressure   [] Palpitations   [] Shortness of breath when laying flat   [] Shortness of breath at rest   [] Shortness of breath with exertion. Vascular:  [] Pain in legs with walking   [] Pain in legs at rest   [] Pain in legs when laying flat   [] Claudication   [x] Pain in feet when walking  [x] Pain in feet at rest  [x] Pain in feet when laying flat   [] History of DVT   [] Phlebitis   [] Swelling in legs   [] Varicose veins   [] Non-healing ulcers Pulmonary:   [] Uses home oxygen    [] Productive cough   [] Hemoptysis   [] Wheeze  [] COPD   [] Asthma Neurologic:  [] Dizziness  [] Blackouts   [] Seizures   [] History of stroke   [] History of TIA  [] Aphasia   [] Temporary blindness   [] Dysphagia   [] Weakness or numbness in arms   [] Weakness or numbness in legs Musculoskeletal:  [] Arthritis   [] Joint swelling   [] Joint pain   [] Low back pain Hematologic:  [] Easy bruising  []   Easy bleeding   [] Hypercoagulable state   [] Anemic  [] Hepatitis Gastrointestinal:  [] Blood in stool   [] Vomiting blood  [] Gastroesophageal reflux/heartburn   [] Difficulty swallowing. Genitourinary:  [] Chronic kidney disease   [] Difficult urination  [] Frequent urination  [] Burning with urination   [] Blood in urine Skin:  [] Rashes   [] Ulcers   [] Wounds Psychological:  [] History of anxiety   []  History of major depression.  Physical Examination  Vitals:   04/13/21 1941 04/14/21 0021 04/14/21 0502 04/14/21 0754  BP: 113/60 122/62 (!) 116/53 (!) 116/55  Pulse: 75 78 78 74  Resp: 18 17 18 18   Temp: 99.6 F (37.6 C) (!) 100.7 F (38.2 C) 99.8 F (37.7 C) 99.3 F (37.4 C)  TempSrc:      SpO2: 97% 97% 98% 96%  Weight:      Height:       Body mass index is 27.15 kg/m. Gen:  WD/WN, NAD Head: [place]/AT, No temporalis wasting. Prominent temp pulse not noted. Ear/Nose/Throat: Hearing grossly intact, nares w/o erythema or drainage, oropharynx w/o Erythema/Exudate Eyes: Sclera non-icteric, conjunctiva clear Neck: Trachea midline.  No JVD.  Pulmonary:  Good air movement, respirations not labored, equal bilaterally.  Cardiac: RRR, normal S1, S2.  Vascular:  Vessel Right Left  Radial Palpable Palpable  Ulnar Palpable Palpable  Brachial Palpable Palpable  Carotid Palpable, without bruit Palpable, without bruit  Aorta Not palpable N/A  Femoral Palpable Palpable  Popliteal Palpable Palpable  PT Palpable Palpable  DP Palpable Palpable   Left lower extremity: Thigh soft. Calf soft. Extremities warm distally to toes.   Wounds noted to the left foot.    Document Information Photos    04/13/2021 07:56  Attached To:  Hospital Encounter on 04/12/21  Source Information [name], [name], [name]  [facility] (1a)   Gastrointestinal: soft, non-tender/non-distended. No guarding/reflex.  Musculoskeletal: M/S 5/5 throughout.  Extremities without ischemic changes.  No deformity or atrophy. No edema. Neurologic: Sensation grossly intact in extremities.  Symmetrical.  Speech is fluent. Motor exam as listed above. Psychiatric: Judgment intact, Mood & affect appropriate for pt's clinical situation. Dermatologic: As above  Lymph : No Cervical, Axillary, or Inguinal lymphadenopathy.  CBC Lab Results  Component Value Date   WBC 28.8 (H) 04/14/2021   HGB 9.1 (L) 04/14/2021   HCT 27.5 (L) 04/14/2021   MCV 91.7 04/14/2021   PLT 361 04/14/2021   BMET    Component Value Date/Time   NA 133 (L) 04/14/2021 0547   NA 135 06/19/2016 1655   K 3.1 (L) 04/14/2021 0547   CL 101 04/14/2021 0547   CO2 20 (L) 04/14/2021 0547   GLUCOSE 171 (H) 04/14/2021 0547   BUN 42 (H) 04/14/2021 0547   BUN 28 (H) 06/19/2016 1655   CREATININE 1.05 04/14/2021 0547   CREATININE 1.02 02/09/2021 1135   CALCIUM 8.6 (L) 04/14/2021 0547   GFRNONAA >60 04/14/2021 0547   GFRNONAA 83 02/09/2021 1135   GFRAA 96 02/09/2021 1135   Estimated Creatinine Clearance: 88.3 mL/min (by C-G formula based on SCr of 1.05 mg/dL).  COAG No results found for: INR, PROTIME  Radiology MR FOOT RIGHT W WO CONTRAST  Result Date: 04/13/2021 CLINICAL DATA:  Diabetic patient with a skin ulceration on the lateral aspect of the right foot. Elevated white blood cell count. EXAM: MRI OF THE RIGHT FOREFOOT WITHOUT AND WITH CONTRAST TECHNIQUE: Multiplanar, multisequence MR imaging of the right forefoot was performed before and after the administration of intravenous contrast. CONTRAST:  9 mL GADAVIST IV  SOLN COMPARISON:  Plain films right foot 04/12/2021. FINDINGS:  Bones/Joint/Cartilage There is mild marrow edema and enhancement the lateral aspect of the proximal 2 cm of the fifth metatarsal consistent with osteomyelitis. Signal change and enhancement are subjacent to a skin ulceration. Bone marrow signal is otherwise normal. No joint effusion is identified. Ligaments Intact. Muscles and Tendons Intact. No intramuscular fluid collection. Intermediate increased T2 signal in intrinsic musculature of the foot is most consistent with diabetic myopathy. Soft tissues Skin ulceration is seen at the base of the fifth metatarsal. No underlying abscess. There is a small amount of gas in soft tissues subjacent to the wound. Subcutaneous edema and enhancement about the foot are consistent with cellulitis. IMPRESSION: Skin ulceration just peripheral to the base of the fifth metatarsal with signal change and enhancement in the proximal 2 cm of the fifth metatarsal consistent with osteomyelitis. Negative for abscess or septic joint. Subcutaneous edema and enhancement about the foot most consistent with cellulitis. Electronically Signed   By: Inge Rise M.D.   On: 04/13/2021 16:12   DG Foot Complete Right  Result Date: 04/12/2021 CLINICAL DATA:  Right foot pain, infection EXAM: RIGHT FOOT COMPLETE - 3+ VIEW COMPARISON:  04/05/2021 FINDINGS: Frontal, oblique, and lateral views of the right foot are obtained. No fracture, subluxation, or dislocation. Ulceration within the lateral midfoot overlying the base of the fifth metatarsal. Minimal subcutaneous gas. No underlying bony destruction or periosteal reaction to suggest osteomyelitis. There is diffuse soft tissue swelling throughout the remainder of the forefoot and midfoot. IMPRESSION: 1. Soft tissue ulceration lateral aspect midfoot, with no evidence of underlying osteomyelitis. 2. Diffuse soft tissue edema consistent with cellulitis. Electronically Signed   By: Randa Ngo M.D.   On: 04/12/2021 18:48   DG Foot Complete  Right  Result Date: 04/05/2021 Please see detailed radiograph report in office note.  Assessment/Plan [name] is a 55 year old male with medical history significant for DM, HTN, PAD with history of left partial fifth metatarsal base resection July 2021 after presenting with gangrene who presents to the emergency room with progressive worsening of an ulcer of the base of the right foot similar to presentation a year prior with the left foot.    1.  Atherosclerotic Disease to the Left lower Extremity with Ulceration: Patient presents with nonhealing wound to the foot of the left extremity. Patient with multiple risk factors for atherosclerotic disease. In the setting of a nonhealing wound on palpable pedal pulses recommend patient undergo a left lower extremity angiogram with possible intervention and attempt assess patient's anatomy and contributing degree of atherosclerotic disease. If appropriate an attempt to revascularize leg can be made at that time. Procedure, risks and benefits were explained to the patient. All questions were answered. Patient wished to proceed. We will plan on this today.  2.  Diabetes: On appropriate medications Encouraged good control as its slows the progression of atherosclerotic disease  3.  Hyperlipidemia: On aspirin and statin for medical management Encouraged good control as its slows the progression of atherosclerotic disease  Discussed with Dr. Mayme Genta, PA-C  04/14/2021 9:36 AM  This note was created with Dragon medical transcription system.  Any error is purely unintentional.

## 2021-04-14 NOTE — Progress Notes (Signed)
Dr. Lucky Cowboy at bedside, speaking with pt. And wife re: procedural results. Wife verbalized understanding; pt. Sleepy.

## 2021-04-14 NOTE — Op Note (Addendum)
[facility]  Percutaneous Study/Intervention Procedural Note   Date of Surgery: 04/14/2021  Surgeon(s):Jimia Gentles    Assistants:none  Pre-operative Diagnosis: gangrene right foot  Post-operative diagnosis:  Same  Procedure(s) Performed:             1.  Ultrasound guidance for vascular access left femoral artery             2.  Catheter placement into right SFA from left femoral approach             3.  Aortogram and selective right lower extremity angiogram             4.  StarClose closure device left femoral artery  EBL: 5 cc  Contrast: 35 cc  Fluoro Time: 1.2 minutes  Moderate Conscious Sedation Time: approximately 22 minutes using 1 mg of Versed and 50 mcg of Fentanyl              Indications:  Patient is a 55 y.o.male with ulceration and infection with some early gangrenous changes to the right foot. The patient is brought in for angiography for further evaluation and potential treatment.  Due to the limb threatening nature of the situation, angiogram was performed for attempted limb salvage. The patient is aware that if the procedure fails, amputation would be expected.  The patient also understands that even with successful revascularization, amputation may still be required due to the severity of the situation.  Risks and benefits are discussed and informed consent is obtained.   Procedure:  The patient was identified and appropriate procedural time out was performed.  The patient was then placed supine on the table and prepped and draped in the usual sterile fashion. Moderate conscious sedation was administered during a face to face encounter with the patient throughout the procedure with my supervision of the RN administering medicines and monitoring the patient's vital signs, pulse oximetry, telemetry and mental status throughout from the start of the procedure until the patient was taken to the recovery room. Ultrasound was used to evaluate the left  common femoral artery.  It was patent .  A digital ultrasound image was acquired.  A Seldinger needle was used to access the left common femoral artery under direct ultrasound guidance and a permanent image was performed.  A 0.035 J wire was advanced without resistance and a 5Fr sheath was placed.  Pigtail catheter was placed into the aorta and an AP aortogram was performed. This demonstrated normal renal arteries and normal aorta and iliac segments without significant stenosis. I then crossed the aortic bifurcation and advanced to the proximal right SFA. Selective right lower extremity angiogram was then performed. This demonstrated normal common femoral artery, profunda femoris artery, superficial femoral artery, and popliteal artery.  There was a typical tibial trifurcation with three-vessel runoff distally without focal stenosis.  The patient has adequate blood flow and there is no role for endovascular revascularization.  I elected to terminate the procedure. The sheath was removed and StarClose closure device was deployed in the left femoral artery with excellent hemostatic result. The patient was taken to the recovery room in stable condition having tolerated the procedure well.  Findings:               Aortogram:  Normal renal arteries, normal aorta and iliac arteries without significant stenosis             Right lower Extremity:  Normal common femoral artery, profunda femoris artery, superficial femoral artery,  and popliteal artery.  There was a typical tibial trifurcation with three-vessel runoff distally without focal stenosis.   Disposition: Patient was taken to the recovery room in stable condition having tolerated the procedure well.  Complications: None  [name] 04/14/2021 6:07 PM   This note was created with Dragon Medical transcription system. Any errors in dictation are purely unintentional.

## 2021-04-14 NOTE — TOC Progression Note (Signed)
Transition of Care [facility]) - Progression Note    Patient Details  Name: [name] MRN: [id number] Date of Birth: 05-26-1966  Transition of Care [facility]) CM/SW [place], [place] Phone Number: 04/14/2021, 3:21 PM  Clinical Narrative:     Received consult for benefits check, sent out for Lantus or Levemir insulins are covered under pt's insurance plan and see what the co-pays would be. Pending response.   Spoke with patient's wife, reports no TOC concerns right now, mostly wanting information about patient's NPO status as he's diabetic and she is concerned with him sounding "groggy" over the phone. Spoke with RN who will call patient's wife to discuss. RN reports reaching out to MD as well to confirm plan of care.        Expected Discharge Plan and Services                                                 Social Determinants of Health (SDOH) Interventions    Readmission Risk Interventions No flowsheet data found.

## 2021-04-15 ENCOUNTER — Encounter: Payer: Self-pay | Admitting: Vascular Surgery

## 2021-04-15 DIAGNOSIS — I96 Gangrene, not elsewhere classified: Secondary | ICD-10-CM

## 2021-04-15 DIAGNOSIS — L97509 Non-pressure chronic ulcer of other part of unspecified foot with unspecified severity: Secondary | ICD-10-CM | POA: Diagnosis not present

## 2021-04-15 DIAGNOSIS — L03115 Cellulitis of right lower limb: Secondary | ICD-10-CM

## 2021-04-15 LAB — BASIC METABOLIC PANEL
Anion gap: 9 (ref 5–15)
BUN: 35 mg/dL — ABNORMAL HIGH (ref 6–20)
CO2: 21 mmol/L — ABNORMAL LOW (ref 22–32)
Calcium: 8.7 mg/dL — ABNORMAL LOW (ref 8.9–10.3)
Chloride: 107 mmol/L (ref 98–111)
Creatinine, Ser: 0.87 mg/dL (ref 0.61–1.24)
GFR, Estimated: >60 mL/min (ref >60)
Glucose, Bld: 130 mg/dL — ABNORMAL HIGH (ref 70–99)
Potassium: 3.5 mmol/L (ref 3.5–5.1)
Sodium: 137 mmol/L (ref 135–145)

## 2021-04-15 LAB — CBC WITH DIFFERENTIAL/PLATELET
Abs Immature Granulocytes: 0.43 10*3/uL — ABNORMAL HIGH (ref 0.00–0.07)
Basophils Absolute: 0.1 10*3/uL (ref 0.0–0.1)
Basophils Relative: 0 %
Eosinophils Absolute: 0 10*3/uL (ref 0.0–0.5)
Eosinophils Relative: 0 %
HCT: 27 % — ABNORMAL LOW (ref 39.0–52.0)
Hemoglobin: 9 g/dL — ABNORMAL LOW (ref 13.0–17.0)
Immature Granulocytes: 2 %
Lymphocytes Relative: 4 %
Lymphs Abs: 1 10*3/uL (ref 0.7–4.0)
MCH: 30.6 pg (ref 26.0–34.0)
MCHC: 33.3 g/dL (ref 30.0–36.0)
MCV: 91.8 fL (ref 80.0–100.0)
Monocytes Absolute: 2 10*3/uL — ABNORMAL HIGH (ref 0.1–1.0)
Monocytes Relative: 8 %
Neutro Abs: 20.8 10*3/uL — ABNORMAL HIGH (ref 1.7–7.7)
Neutrophils Relative %: 86 %
Platelets: 417 10*3/uL — ABNORMAL HIGH (ref 150–400)
RBC: 2.94 MIL/uL — ABNORMAL LOW (ref 4.22–5.81)
RDW: 11.7 % (ref 11.5–15.5)
WBC: 24.3 10*3/uL — ABNORMAL HIGH (ref 4.0–10.5)
nRBC: 0 % (ref 0.0–0.2)

## 2021-04-15 LAB — GLUCOSE, CAPILLARY
Glucose-Capillary: 126 mg/dL — ABNORMAL HIGH (ref 70–99)
Glucose-Capillary: 177 mg/dL — ABNORMAL HIGH (ref 70–99)
Glucose-Capillary: 166 mg/dL — ABNORMAL HIGH (ref 70–99)
Glucose-Capillary: 173 mg/dL — ABNORMAL HIGH (ref 70–99)

## 2021-04-15 LAB — C-REACTIVE PROTEIN: CRP: 26.1 mg/dL — ABNORMAL HIGH

## 2021-04-15 LAB — MAGNESIUM: Magnesium: 2.1 mg/dL (ref 1.7–2.4)

## 2021-04-15 MED ORDER — VANCOMYCIN HCL 1250 MG/250ML IV SOLN
1250.0000 mg | Freq: Two times a day (BID) | INTRAVENOUS | Status: DC
  Administered 2021-04-15 – 2021-04-17 (×6): 1250 mg via INTRAVENOUS
  Filled 2021-04-15 (×7): qty 250

## 2021-04-15 NOTE — Progress Notes (Signed)
Patient ID: LEVIE WAGES, male   DOB: January 22, 1966, 55 y.o.   MRN: [id number]  PROGRESS NOTE    ZINEDINE ELLNER  NOB:[id number] DOB: [birth year] DOA: 04/12/2021 PCP: [name], MD   Brief Narrative:   55 y.o. male with medical history significant for DM type II, HTN, PAD with history of left partial fifth metatarsal base resection July 2021 after presenting with gangrene presented with worsening right foot pain and worsening foot ulcer despite undergoing excisional debridement on 04/05/2021 by podiatry and oral doxycycline.  On presentation, he had WBCs of 26,000 with creatinine of 1.38, up from 1.02 which is his baseline.  X-ray of the right foot showed soft tissue ulceration of lateral aspect of midfoot with no evidence of underlying osteomyelitis with diffuse soft tissue edema.  He was started on broad-spectrum antibiotics.  Podiatry and vascular surgery have been consulted.  Assessment & Plan:   Cellulitis of right foot/diabetic right foot ulcer infection with osteomyelitis PAD with history of left metatarsal base amputation Leukocytosis -Patient presented with worsening right foot infected ulcer despite undergoing wound debridement on 04/05/2021 and outpatient doxycycline -Currently on broad-spectrum antibiotics Rocephin, Flagyl and vancomycin.   -MRI of the right foot is consistent with osteomyelitis.  Podiatry and vascular surgery following  -Status post aortogram and right lower extremity angiogram done by vascular surgery on 04/14/2021.  Follow further recommendations from podiatry -No temperature spikes over the last 24 hours.  Blood cultures negative so far -Continue rosuvastatin for PAD.  Not on antiplatelets -Still has significant leukocytosis but improving.  Monitor  Diabetes mellitus type II with hyperglycemia -A1c 9.9.  Continue CBGs with SSI.  Continue Levemir.  Hypertension -Monitor blood pressure.  Continue irbesartan/hydrochlorothiazide/nebivolol  Acute kidney  injury -Presented with creatinine of 1.38: Baseline of around 1 to 2 months ago.  Treated with IV fluids.  Resolved.  Anemia -Unknown cause.  Hemoglobin stable.  Outpatient follow-up.  Hypokalemia -Improved  DVT prophylaxis: SCDs Code Status: Full Family Communication: Wife at bedside on 04/14/2021 Disposition Plan: Status is: Inpatient  Remains inpatient appropriate because:Inpatient level of care appropriate due to severity of illness   Dispo: The patient is from: Home              Anticipated d/c is to: Home              Patient currently is not medically stable to d/c.   Difficult to place patient No  Consultants: Podiatry/vascular surgery  Procedures:   aortogram and right lower extremity angiogram done by vascular surgery on 04/14/2021  Antimicrobials: Rocephin and vancomycin from 04/12/2021 onwards.  Flagyl from 04/13/2021 onwards   Subjective: Patient seen and examined at bedside.  No worsening fever, shortness of breath, chest pain reported.  Complains of intermittent right foot pain. Objective: Vitals:   04/14/21 1841 04/14/21 1902 04/14/21 2023 04/15/21 0338  BP: 137/71 133/73 (!) 149/92 (!) 124/58  Pulse: 72 71 71 70  Resp: 17  18 17   Temp:  98.3 F (36.8 C) 98.2 F (36.8 C) 99 F (37.2 C)  TempSrc:  Oral Oral   SpO2: 99% 100% 100% 99%  Weight:      Height:        Intake/Output Summary (Last 24 hours) at 04/15/2021 0729 Last data filed at 04/15/2021 0246 Gross per 24 hour  Intake 163.29 ml  Output 850 ml  Net -686.71 ml   Filed Weights   04/13/21 0116 04/14/21 1640  Weight: 90.8 kg 90.8 kg  Examination:  General exam: Currently on room air.  No distress. Respiratory system: Bilateral decreased breath sounds at bases  cardiovascular system: S1-S2 heard, rate controlled gastrointestinal system: Abdomen is distended slightly, soft and nontender.  Normal bowel sounds heard extremities: No cyanosis; trace lower extremity edema present  Data Reviewed: I  have personally reviewed following labs and imaging studies  CBC: Recent Labs  Lab 04/12/21 1631 04/13/21 0511 04/14/21 0547 04/15/21 0527  WBC 26.8* 25.6* 28.8* 24.3*  NEUTROABS 23.7*  --  24.9* 20.8*  HGB 11.1* 9.7* 9.1* 9.0*  HCT 33.5* 29.6* 27.5* 27.0*  MCV 91.5 92.8 91.7 91.8  PLT 420* 343 361 [id number]*   Basic Metabolic Panel: Recent Labs  Lab 04/12/21 1631 04/13/21 0511 04/14/21 0547 04/15/21 0527  NA 132* 135 133* 137  K 3.9 3.6 3.1* 3.5  CL 95* 101 101 107  CO2 18* 18* 20* 21*  GLUCOSE 417* 236* 171* 130*  BUN 53* 50* 42* 35*  CREATININE 1.38* 1.19 1.05 0.87  CALCIUM 9.3 8.6* 8.6* 8.7*  MG  --   --  2.2 2.1   GFR: Estimated Creatinine Clearance: 106.5 mL/min (by C-G formula based on SCr of 0.87 mg/dL). Liver Function Tests: Recent Labs  Lab 04/12/21 1631 04/13/21 0511  AST 12* 8*  ALT 11 9  ALKPHOS 86 75  BILITOT 3.7* 2.6*  PROT 8.0 6.6  ALBUMIN 2.7* 2.0*   No results for input(s): LIPASE, AMYLASE in the last 168 hours. No results for input(s): AMMONIA in the last 168 hours. Coagulation Profile: No results for input(s): INR, PROTIME in the last 168 hours. Cardiac Enzymes: No results for input(s): CKTOTAL, CKMB, CKMBINDEX, TROPONINI in the last 168 hours. BNP (last 3 results) No results for input(s): PROBNP in the last 8760 hours. HbA1C: Recent Labs    04/12/21 2339  HGBA1C 9.9*   CBG: Recent Labs  Lab 04/14/21 0754 04/14/21 1203 04/14/21 1545 04/14/21 1829 04/14/21 2124  GLUCAP 181* 159* 145* 131* 143*   Lipid Profile: No results for input(s): CHOL, HDL, LDLCALC, TRIG, CHOLHDL, LDLDIRECT in the last 72 hours. Thyroid Function Tests: No results for input(s): TSH, T4TOTAL, FREET4, T3FREE, THYROIDAB in the last 72 hours. Anemia Panel: Recent Labs    04/13/21 0511  VITAMINB12 231  FOLATE 14.3  FERRITIN 1,118*  TIBC 168*  IRON 30*   Sepsis Labs: Recent Labs  Lab 04/12/21 1631 04/12/21 2339  LATICACIDVEN 1.8 1.1    Recent  Results (from the past 240 hour(s))  SARS CORONAVIRUS 2 (TAT 6-24 HRS) Nasopharyngeal Nasopharyngeal Swab     Status: None   Collection Time: 04/12/21  6:30 PM   Specimen: Nasopharyngeal Swab  Result Value Ref Range Status   SARS Coronavirus 2 NEGATIVE NEGATIVE Final    Comment: (NOTE) SARS-CoV-2 target nucleic acids are NOT DETECTED.  The SARS-CoV-2 RNA is generally detectable in upper and lower respiratory specimens during the acute phase of infection. Negative results do not preclude SARS-CoV-2 infection, do not rule out co-infections with other pathogens, and should not be used as the sole basis for treatment or other patient management decisions. Negative results must be combined with clinical observations, patient history, and epidemiological information. The expected result is Negative.  Fact Sheet for Patients: SugarRoll.be  Fact Sheet for Healthcare Providers: https://www.woods-mathews.com/  This test is not yet approved or cleared by the [place] FDA and  has been authorized for detection and/or diagnosis of SARS-CoV-2 by FDA under an Emergency Use Authorization (EUA). This EUA will remain  in effect (meaning this test can be used) for the duration of the COVID-19 declaration under Se ction 564(b)(1) of the Act, 21 U.S.C. section 360bbb-3(b)(1), unless the authorization is terminated or revoked sooner.  Performed at [facility], [place] [address] [place], [place] [address]   Aerobic Culture w Gram Stain (superficial specimen)     Status: None (Preliminary result)   Collection Time: 04/13/21  8:56 AM   Specimen: Wound  Result Value Ref Range Status   Specimen Description WOUND RIGHT FOOT  Final   Special Requests NONE  Final   Gram Stain   Final    FEW WBC PRESENT,BOTH PMN AND MONONUCLEAR FEW GRAM POSITIVE COCCI RARE GRAM VARIABLE ROD    Culture   Final    CULTURE REINCUBATED FOR BETTER GROWTH Performed at  [facility], [place] [address], [place], [place] [address]    Report Status PENDING  Incomplete  CULTURE, BLOOD (ROUTINE X 2) w Reflex to ID Panel     Status: None (Preliminary result)   Collection Time: 04/14/21  8:27 AM   Specimen: BLOOD  Result Value Ref Range Status   Specimen Description BLOOD LEFT HAND  Final   Special Requests   Final    BOTTLES DRAWN AEROBIC AND ANAEROBIC Blood Culture adequate volume   Culture   Final    NO GROWTH < 24 HOURS Performed at [facility], [address] [place], [place] [address]    Report Status PENDING  Incomplete  CULTURE, BLOOD (ROUTINE X 2) w Reflex to ID Panel     Status: None (Preliminary result)   Collection Time: 04/14/21  8:28 AM   Specimen: BLOOD  Result Value Ref Range Status   Specimen Description BLOOD  RIGHT HAND  Final   Special Requests   Final    BOTTLES DRAWN AEROBIC AND ANAEROBIC Blood Culture adequate volume   Culture   Final    NO GROWTH < 24 HOURS Performed at [facility], [address], [place], [place] [address]    Report Status PENDING  Incomplete         Radiology Studies: MR FOOT RIGHT W WO CONTRAST  Result Date: 04/13/2021 CLINICAL DATA:  Diabetic patient with a skin ulceration on the lateral aspect of the right foot. Elevated white blood cell count. EXAM: MRI OF THE RIGHT FOREFOOT WITHOUT AND WITH CONTRAST TECHNIQUE: Multiplanar, multisequence MR imaging of the right forefoot was performed before and after the administration of intravenous contrast. CONTRAST:  9 mL GADAVIST IV SOLN COMPARISON:  Plain films right foot 04/12/2021. FINDINGS: Bones/Joint/Cartilage There is mild marrow edema and enhancement the lateral aspect of the proximal 2 cm of the fifth metatarsal consistent with osteomyelitis. Signal change and enhancement are subjacent to a skin ulceration. Bone marrow signal is otherwise normal. No joint effusion is identified. Ligaments Intact. Muscles and Tendons Intact. No  intramuscular fluid collection. Intermediate increased T2 signal in intrinsic musculature of the foot is most consistent with diabetic myopathy. Soft tissues Skin ulceration is seen at the base of the fifth metatarsal. No underlying abscess. There is a small amount of gas in soft tissues subjacent to the wound. Subcutaneous edema and enhancement about the foot are consistent with cellulitis. IMPRESSION: Skin ulceration just peripheral to the base of the fifth metatarsal with signal change and enhancement in the proximal 2 cm of the fifth metatarsal consistent with osteomyelitis. Negative for abscess or septic joint. Subcutaneous edema and enhancement about the foot most  consistent with cellulitis. Electronically Signed   By: Inge Rise M.D.   On: 04/13/2021 16:12   PERIPHERAL VASCULAR CATHETERIZATION  Result Date: 04/14/2021 See op note  US ARTERIAL LOWER EXTREMITY DUPLEX RIGHT(NON-ABI)  Result Date: 04/14/2021 CLINICAL DATA:  55 year old with right foot ulcer. EXAM: RIGHT LOWER EXTREMITY ARTERIAL DUPLEX SCAN TECHNIQUE: Gray-scale sonography as well as color Doppler and duplex ultrasound was performed to evaluate the lower extremity arteries including the common, superficial and profunda femoral arteries, popliteal artery and calf arteries. COMPARISON:  None. FINDINGS: Right/Left Lower Extremity ABI: Not obtained Inflow: Normal common femoral arterial waveforms and velocities. No evidence of inflow (aortoiliac) disease. Outflow: Normal profunda femoral, superficial femoral and popliteal arterial waveforms and velocities. No focal elevation of the PSV to suggest stenosis. Runoff: Normal posterior tibial, peroneal and anterior tibial arterial waveforms and velocities. IMPRESSION: Normal right lower extremity arterial duplex. Electronically Signed   By: Markus Daft M.D.   On: 04/14/2021 09:34        Scheduled Meds: . gentamicin ointment   Topical BID  . irbesartan  300 mg Oral Daily   And  .  hydrochlorothiazide  25 mg Oral Daily  . insulin aspart  0-15 Units Subcutaneous TID WC  . insulin aspart  0-5 Units Subcutaneous QHS  . insulin detemir  15 Units Subcutaneous Daily  . metroNIDAZOLE  500 mg Oral Q8H  . nebivolol  5 mg Oral Daily  . rosuvastatin  40 mg Oral Daily   Continuous Infusions: . sodium chloride 75 mL/hr at 04/15/21 0707  . cefTRIAXone (ROCEPHIN)  IV 1 g (04/15/21 0509)  . vancomycin 1,000 mg (04/15/21 0246)          Aline August, MD [facility] 04/15/2021, 7:29 AM

## 2021-04-15 NOTE — Progress Notes (Signed)
Pharmacy Antibiotic Note  [name] is a 55 y.o. male admitted on 04/12/2021 with cellulitis.  Pharmacy has been consulted for Vancomycin dosing.  Pt is also currently on Ceftriaxone 1 gm q24h.  Scr: 1.38>>1.19>>1.05>> 0.87   Plan: Change vanc to 1250 mg IV q12h per improved renal function Goal AUC 400-550 Expected AUC: 412 SCr used: 0.87  Pharmacy will continue to follow and adjust abx dosing if warranted.   Height: 6' (182.9 cm) Weight: 90.8 kg (200 lb 2.8 oz) IBW/kg (Calculated) : 77.6  Temp (24hrs), Avg:98.7 F (37.1 C), Min:98.2 F (36.8 C), Max:99.4 F (37.4 C)  Recent Labs  Lab 04/12/21 1631 04/12/21 2339 04/13/21 0511 04/14/21 0547 04/15/21 0527  WBC 26.8*  --  25.6* 28.8* 24.3*  CREATININE 1.38*  --  1.19 1.05 0.87  LATICACIDVEN 1.8 1.1  --   --   --     Estimated Creatinine Clearance: 106.5 mL/min (by C-G formula based on SCr of 0.87 mg/dL).    No Known Allergies  Antimicrobials this admission: 5/3 Flagyl >> x 1 5/3 Cefepime >> x 1 5/4 Ceftriaxone >> 5/3 Vancomycin >>  Microbiology results: 5/4 Wound right foot pending  Thank you for allowing pharmacy to be a part of this patient's care.  Pernell Dupre, PharmD, BCPS Clinical Pharmacist 04/15/2021 10:58 AM

## 2021-04-15 NOTE — TOC Benefit Eligibility Note (Signed)
Transition of Care [facility]) Benefit Eligibility Note    Patient Details  Name: [name] MRN: [id number] Date of Birth: 1966/07/19   Medication/Dose: Lantus  Covered?: Yes  Prescription Coverage Preferred Pharmacy: [facility] with Person/Company/Phone Number:: [employer] with [employer] at [phone]  Co-Pay: Retail: 30% of total cost of medication or minimum of $50 cost for 30 day supply/$125 cost for 90 day supply.  Mail Order: 30% of total cost of medication or minimum of $63 cost for 30 day supply/$125 cost for 90 day supply.  Prior Approval: No  Deductible:  (No deductible on plan.  Out of pocket max: $2,500, of which $1,195.91 met as of time of call.)  Additional Notes: Levemir is not covered under patient's plan.    Dannette Barbara Phone Number: [phone] or [phone] 04/15/2021, 10:14 AM

## 2021-04-15 NOTE — Anesthesia Preprocedure Evaluation (Addendum)
Anesthesia Evaluation  Patient identified by MRN, date of birth, ID band Patient awake    Reviewed: Allergy & Precautions, NPO status , Patient's Chart, lab work & pertinent test results  Airway Mallampati: II  TM Distance: >3 FB     Dental   Pulmonary neg pulmonary ROS,    Pulmonary exam normal        Cardiovascular hypertension, + Peripheral Vascular Disease       Neuro/Psych negative neurological ROS  negative psych ROS   GI/Hepatic negative GI ROS, Neg liver ROS,   Endo/Other  diabetes  Renal/GU Renal disease     Musculoskeletal negative musculoskeletal ROS (+)   Abdominal Normal abdominal exam  (+)   Peds  Hematology  (+) anemia ,   Anesthesia Other Findings Past Medical History: No date: Diabetes mellitus without complication (HCC) No date: Hyperlipidemia No date: Hypertension   Reproductive/Obstetrics                            Anesthesia Physical Anesthesia Plan  ASA: II  Anesthesia Plan: General   Post-op Pain Management:    Induction: Intravenous  PONV Risk Score and Plan:   Airway Management Planned: LMA  Additional Equipment:   Intra-op Plan:   Post-operative Plan: Extubation in OR  Informed Consent: I have reviewed the patients History and Physical, chart, labs and discussed the procedure including the risks, benefits and alternatives for the proposed anesthesia with the patient or authorized representative who has indicated his/her understanding and acceptance.     Dental advisory given  Plan Discussed with: CRNA and Surgeon  Anesthesia Plan Comments:         Anesthesia Quick Evaluation                                  Anesthesia Evaluation  Patient identified by MRN, date of birth, ID band Patient awake    Reviewed: Allergy & Precautions, H&P , NPO status , Patient's Chart, lab work & pertinent test results, reviewed documented beta blocker  date and time   History of Anesthesia Complications Negative for: history of anesthetic complications  Airway Mallampati: I  TM Distance: >3 FB Neck ROM: full    Dental  (+) Dental Advidsory Given, Teeth Intact   Pulmonary neg pulmonary ROS,    Pulmonary exam normal breath sounds clear to auscultation       Cardiovascular Exercise Tolerance: Good hypertension, (-) angina(-) Past MI and (-) Cardiac Stents Normal cardiovascular exam(-) dysrhythmias (-) Valvular Problems/Murmurs Rhythm:regular Rate:Normal     Neuro/Psych negative neurological ROS  negative psych ROS   GI/Hepatic negative GI ROS, Neg liver ROS,   Endo/Other  diabetes  Renal/GU CRFRenal disease  negative genitourinary   Musculoskeletal   Abdominal   Peds  Hematology negative hematology ROS (+)   Anesthesia Other Findings Past Medical History: No date: Diabetes mellitus without complication (HCC) No date: Hyperlipidemia No date: Hypertension   Reproductive/Obstetrics negative OB ROS                            Anesthesia Physical Anesthesia Plan  ASA: III and emergent  Anesthesia Plan: General   Post-op Pain Management:    Induction: Intravenous and Rapid sequence  PONV Risk Score and Plan: 2 and Ondansetron, Dexamethasone, Midazolam and Treatment may vary due to age or medical condition  Airway Management Planned: Oral ETT  Additional Equipment:   Intra-op Plan:   Post-operative Plan: Extubation in OR  Informed Consent: I have reviewed the patients History and Physical, chart, labs and discussed the procedure including the risks, benefits and alternatives for the proposed anesthesia with the patient or authorized representative who has indicated his/her understanding and acceptance.     Dental Advisory Given  Plan Discussed with: Anesthesiologist, CRNA and Surgeon  Anesthesia Plan Comments: (Patient refused COVID testing, but the case was  emergent thus we proceeded as a suspected positive.  Patient will receive a rapid sequence intubation to avoid aerosolization, but no cricoid will be done as we are not trying to prevent aspiration.)      Anesthesia Quick Evaluation

## 2021-04-15 NOTE — H&P (View-Only) (Signed)
   PODIATRY PROGRESS NOTE  NAME [name] MRN [id number] DOB [birth year] DOA 04/12/2021   Reason for consult: OM RT FOOT Chief Complaint  Patient presents with  . Foot Pain    Consulting physician: Aline August, MD  History of present illness: 55 y.o. male s/p aortogram and selective right lower extremity angiogram 04/14/2021 with diabetic ulcer lateral aspect of the right foot positive for osteomyelitis based on MRI seen at bedside with his wife, [name], to discuss MRI findings and surgical consultation.  Past Medical History:  Diagnosis Date  . Diabetes mellitus without complication ([place])   . Hyperlipidemia   . Hypertension     CBC Latest Ref Rng & Units 04/15/2021 04/14/2021 04/13/2021  WBC 4.0 - 10.5 K/uL 24.3(H) 28.8(H) 25.6(H)  Hemoglobin 13.0 - 17.0 g/dL 9.0(L) 9.1(L) 9.7(L)  Hematocrit 39.0 - 52.0 % 27.0(L) 27.5(L) 29.6(L)  Platelets 150 - 400 K/uL 417(H) 361 343    BMP Latest Ref Rng & Units 04/15/2021 04/14/2021 04/13/2021  Glucose 70 - 99 mg/dL 130(H) 171(H) 236(H)  BUN 6 - 20 mg/dL 35(H) 42(H) 50(H)  Creatinine 0.61 - 1.24 mg/dL 0.87 1.05 1.19  BUN/Creat Ratio 6 - 22 (calc) - - -  Sodium 135 - 145 mmol/L 137 133(L) 135  Potassium 3.5 - 5.1 mmol/L 3.5 3.1(L) 3.6  Chloride 98 - 111 mmol/L 107 101 101  CO2 22 - 32 mmol/L 21(L) 20(L) 18(L)  Calcium 8.9 - 10.3 mg/dL 8.7(L) 8.6(L) 8.6(L)      Physical Exam: General: The patient is alert and oriented x3 in no acute distress.  Patient resting comfortably in bed     MR RT FOOT W/ WO CONTRAST 04/13/2021 IMPRESSION:  Skin ulceration just peripheral to the base of the fifth metatarsal with signal change and enhancement in the proximal 2 cm of the fifth metatarsal consistent with osteomyelitis.  Negative for abscess or septic joint.  Subcutaneous edema and enhancement about the foot most consistent with cellulitis.   ASSESSMENT/PLAN OF CARE Osteomyelitis right fifth metatarsal - After long discussion with both  patient and his spouse at bedside, it would be in his best interest to undergo fifth ray amputation.  The procedure was explained in detail with all possible complications.  Benefits and risks associated to the surgery were explained in detail.  Patient and spouse agree.  All patient questions were answered.  No guarantees were expressed or implied. - Orders placed for fifth ray amputation right foot. -Surgery tentatively scheduled for 04/16/2021 1 PM. -N.p.o. after midnight -Podiatry will continue to follow     Thank you for the consult.  Please contact me directly with any questions or concerns.  Cell [phone]   Edrick Kins, DPM [facility]  Dr. Edrick Kins, DPM    [address], [place] [address]                Office [phone]  Fax [phone]

## 2021-04-15 NOTE — Progress Notes (Signed)
   PODIATRY PROGRESS NOTE  NAME [name] MRN [id number] DOB 12/30/1965 DOA 04/12/2021   Reason for consult: OM RT FOOT Chief Complaint  Patient presents with  . Foot Pain    Consulting physician: Alekh, Kshitiz, MD  History of present illness: 55 y.o. male s/p aortogram and selective right lower extremity angiogram 04/14/2021 with diabetic ulcer lateral aspect of the right foot positive for osteomyelitis based on MRI seen at bedside with his wife, Tara, to discuss MRI findings and surgical consultation.  Past Medical History:  Diagnosis Date  . Diabetes mellitus without complication (HCC)   . Hyperlipidemia   . Hypertension     CBC Latest Ref Rng & Units 04/15/2021 04/14/2021 04/13/2021  WBC 4.0 - 10.5 K/uL 24.3(H) 28.8(H) 25.6(H)  Hemoglobin 13.0 - 17.0 g/dL 9.0(L) 9.1(L) 9.7(L)  Hematocrit 39.0 - 52.0 % 27.0(L) 27.5(L) 29.6(L)  Platelets 150 - 400 K/uL 417(H) 361 343    BMP Latest Ref Rng & Units 04/15/2021 04/14/2021 04/13/2021  Glucose 70 - 99 mg/dL 130(H) 171(H) 236(H)  BUN 6 - 20 mg/dL 35(H) 42(H) 50(H)  Creatinine 0.61 - 1.24 mg/dL 0.87 1.05 1.19  BUN/Creat Ratio 6 - 22 (calc) - - -  Sodium 135 - 145 mmol/L 137 133(L) 135  Potassium 3.5 - 5.1 mmol/L 3.5 3.1(L) 3.6  Chloride 98 - 111 mmol/L 107 101 101  CO2 22 - 32 mmol/L 21(L) 20(L) 18(L)  Calcium 8.9 - 10.3 mg/dL 8.7(L) 8.6(L) 8.6(L)      Physical Exam: General: The patient is alert and oriented x3 in no acute distress.  Patient resting comfortably in bed     MR RT FOOT W/ WO CONTRAST 04/13/2021 IMPRESSION:  Skin ulceration just peripheral to the base of the fifth metatarsal with signal change and enhancement in the proximal 2 cm of the fifth metatarsal consistent with osteomyelitis.  Negative for abscess or septic joint.  Subcutaneous edema and enhancement about the foot most consistent with cellulitis.   ASSESSMENT/PLAN OF CARE Osteomyelitis right fifth metatarsal - After long discussion with both  patient and his spouse at bedside, it would be in his best interest to undergo fifth ray amputation.  The procedure was explained in detail with all possible complications.  Benefits and risks associated to the surgery were explained in detail.  Patient and spouse agree.  All patient questions were answered.  No guarantees were expressed or implied. - Orders placed for fifth ray amputation right foot. -Surgery tentatively scheduled for 04/16/2021 1 PM. -N.p.o. after midnight -Podiatry will continue to follow     Thank you for the consult.  Please contact me directly with any questions or concerns.  Cell [phone]   Anistyn Graddy M. Anirudh Baiz, DPM [facility]  Dr. Ansar Skoda M. Corinn Stoltzfus, DPM    [address]                                        [place], [place] [address]                Office [phone]  Fax [phone]     

## 2021-04-15 NOTE — Progress Notes (Signed)
[facility] Daily Progress Note  Subjective: 04/14/21: 1. Ultrasound guidance for vascular access left femoral artery 2. Catheter placement into right SFA from left femoral approach 3. Aortogram and selective right lower extremity angiogram 4. StarClose closure device left femoral artery  Patient without complaint this AM. No issues overnight.  Objective: Vitals:   04/14/21 1902 04/14/21 2023 04/15/21 0338 04/15/21 0739  BP: 133/73 (!) 149/92 (!) 124/58 (!) 143/72  Pulse: 71 71 70 70  Resp:  18 17 15   Temp: 98.3 F (36.8 C) 98.2 F (36.8 C) 99 F (37.2 C) 99.4 F (37.4 C)  TempSrc: Oral Oral  Oral  SpO2: 100% 100% 99% 97%  Weight:      Height:        Intake/Output Summary (Last 24 hours) at 04/15/2021 1205 Last data filed at 04/15/2021 1027 Gross per 24 hour  Intake 403.29 ml  Output 1000 ml  Net -596.71 ml   Physical Exam: A&Ox3, NAD CV: RRR Pulmonary: CTA Bilaterally Abdomen: Soft, Nontender, Nondistended Left groin access site:  Clean dry and intact.  No swelling or drainage noted. Vascular:  Right lower extremity: Thigh soft.  Calf soft.  Extremities warm distally toes.  Motor/sensory is intact.  There is no acute vascular compromise noted to extremity this time.   Laboratory: CBC    Component Value Date/Time   WBC 24.3 (H) 04/15/2021 0527   HGB 9.0 (L) 04/15/2021 0527   HCT 27.0 (L) 04/15/2021 0527   PLT 417 (H) 04/15/2021 0527   BMET    Component Value Date/Time   NA 137 04/15/2021 0527   NA 135 06/19/2016 1655   K 3.5 04/15/2021 0527   CL 107 04/15/2021 0527   CO2 21 (L) 04/15/2021 0527   GLUCOSE 130 (H) 04/15/2021 0527   BUN 35 (H) 04/15/2021 0527   BUN 28 (H) 06/19/2016 1655   CREATININE 0.87 04/15/2021 0527   CREATININE 1.02 02/09/2021 1135   CALCIUM 8.7 (L) 04/15/2021 0527   GFRNONAA >60 04/15/2021 0527   GFRNONAA 83 02/09/2021 1135   GFRAA 96 02/09/2021 1135    Assessment/Planning: The patient is a 55 year old male with chronic non-healing wound s/p right lower extremity angiogram (diagnostic) - POD#1  1) diagnostic right lower extremity angiogram. Access site is clean dry intact. 2) no further recommendations from vascular at this time.  From our standpoint he can be discharged home when medically stable.   3) Will be happy to follow the patient in the outpatient setting.  Discussed with Dr. Ellis Parents Nicolaus Andel PA-C 04/15/2021 12:05 PM

## 2021-04-16 ENCOUNTER — Inpatient Hospital Stay: Payer: Managed Care, Other (non HMO) | Admitting: Anesthesiology

## 2021-04-16 ENCOUNTER — Encounter
Admission: EM | Disposition: A | Discharge status: Home Health | Payer: Self-pay | Source: Home / Self Care | Attending: Internal Medicine

## 2021-04-16 ENCOUNTER — Encounter: Payer: Self-pay | Admitting: Internal Medicine

## 2021-04-16 DIAGNOSIS — L03115 Cellulitis of right lower limb: Secondary | ICD-10-CM

## 2021-04-16 DIAGNOSIS — I96 Gangrene, not elsewhere classified: Secondary | ICD-10-CM

## 2021-04-16 HISTORY — PX: AMPUTATION: SHX166

## 2021-04-16 LAB — CBC WITH DIFFERENTIAL/PLATELET
Abs Immature Granulocytes: 0.65 10*3/uL — ABNORMAL HIGH (ref 0.00–0.07)
Basophils Absolute: 0.1 10*3/uL (ref 0.0–0.1)
Basophils Relative: 0 %
Eosinophils Absolute: 0 10*3/uL (ref 0.0–0.5)
Eosinophils Relative: 0 %
HCT: 26.5 % — ABNORMAL LOW (ref 39.0–52.0)
Hemoglobin: 9 g/dL — ABNORMAL LOW (ref 13.0–17.0)
Immature Granulocytes: 3 %
Lymphocytes Relative: 4 %
Lymphs Abs: 1 10*3/uL (ref 0.7–4.0)
MCH: 31 pg (ref 26.0–34.0)
MCHC: 34 g/dL (ref 30.0–36.0)
MCV: 91.4 fL (ref 80.0–100.0)
Monocytes Absolute: 1.8 10*3/uL — ABNORMAL HIGH (ref 0.1–1.0)
Monocytes Relative: 7 %
Neutro Abs: 20.7 10*3/uL — ABNORMAL HIGH (ref 1.7–7.7)
Neutrophils Relative %: 86 %
Platelets: 395 10*3/uL (ref 150–400)
RBC: 2.9 MIL/uL — ABNORMAL LOW (ref 4.22–5.81)
RDW: 11.9 % (ref 11.5–15.5)
WBC: 24.3 10*3/uL — ABNORMAL HIGH (ref 4.0–10.5)
nRBC: 0 % (ref 0.0–0.2)

## 2021-04-16 LAB — GLUCOSE, CAPILLARY
Glucose-Capillary: 146 mg/dL — ABNORMAL HIGH (ref 70–99)
Glucose-Capillary: 131 mg/dL — ABNORMAL HIGH (ref 70–99)
Glucose-Capillary: 131 mg/dL — ABNORMAL HIGH (ref 70–99)
Glucose-Capillary: 155 mg/dL — ABNORMAL HIGH (ref 70–99)
Glucose-Capillary: 276 mg/dL — ABNORMAL HIGH (ref 70–99)

## 2021-04-16 LAB — BASIC METABOLIC PANEL
Anion gap: 8 (ref 5–15)
BUN: 30 mg/dL — ABNORMAL HIGH (ref 6–20)
CO2: 21 mmol/L — ABNORMAL LOW (ref 22–32)
Calcium: 8.5 mg/dL — ABNORMAL LOW (ref 8.9–10.3)
Chloride: 107 mmol/L (ref 98–111)
Creatinine, Ser: 0.93 mg/dL (ref 0.61–1.24)
GFR, Estimated: >60 mL/min (ref >60)
Glucose, Bld: 141 mg/dL — ABNORMAL HIGH (ref 70–99)
Potassium: 3.3 mmol/L — ABNORMAL LOW (ref 3.5–5.1)
Sodium: 136 mmol/L (ref 135–145)

## 2021-04-16 LAB — MAGNESIUM: Magnesium: 1.9 mg/dL (ref 1.7–2.4)

## 2021-04-16 LAB — C-REACTIVE PROTEIN: CRP: 21.3 mg/dL — ABNORMAL HIGH (ref <1.0)

## 2021-04-16 SURGERY — AMPUTATION, FOOT, RAY
Anesthesia: General | Laterality: Right

## 2021-04-16 MED ORDER — MIDAZOLAM HCL 2 MG/2ML IJ SOLN
INTRAMUSCULAR | Status: AC
  Filled 2021-04-16: qty 2

## 2021-04-16 MED ORDER — PROPOFOL 10 MG/ML IV BOLUS
INTRAVENOUS | Status: AC
  Filled 2021-04-16: qty 20

## 2021-04-16 MED ORDER — ACETAMINOPHEN 10 MG/ML IV SOLN
INTRAVENOUS | Status: AC
  Filled 2021-04-16: qty 100

## 2021-04-16 MED ORDER — GLYCOPYRROLATE 0.2 MG/ML IJ SOLN
INTRAMUSCULAR | Status: DC | PRN
  Administered 2021-04-16: .2 mg via INTRAVENOUS

## 2021-04-16 MED ORDER — SODIUM CHLORIDE 0.9 % IV SOLN: INTRAVENOUS | Status: DC | PRN

## 2021-04-16 MED ORDER — DEXAMETHASONE SODIUM PHOSPHATE 10 MG/ML IJ SOLN
INTRAMUSCULAR | Status: DC | PRN
  Administered 2021-04-16: 5 mg via INTRAVENOUS

## 2021-04-16 MED ORDER — METRONIDAZOLE 500 MG/100ML IV SOLN
500.0000 mg | Freq: Three times a day (TID) | INTRAVENOUS | Status: DC
  Administered 2021-04-16 – 2021-04-22 (×19): 500 mg via INTRAVENOUS
  Filled 2021-04-16 (×22): qty 100

## 2021-04-16 MED ORDER — LIDOCAINE HCL (PF) 1 % IJ SOLN
INTRAMUSCULAR | Status: AC
  Filled 2021-04-16: qty 30

## 2021-04-16 MED ORDER — POTASSIUM CHLORIDE 10 MEQ/100ML IV SOLN: 10.0000 meq | INTRAVENOUS | Status: DC

## 2021-04-16 MED ORDER — PROPOFOL 10 MG/ML IV BOLUS
INTRAVENOUS | Status: DC | PRN
  Administered 2021-04-16: 150 mg via INTRAVENOUS
  Administered 2021-04-16: 30 mg via INTRAVENOUS

## 2021-04-16 MED ORDER — LIDOCAINE HCL (CARDIAC) PF 100 MG/5ML IV SOSY
PREFILLED_SYRINGE | INTRAVENOUS | Status: DC | PRN
  Administered 2021-04-16: 60 mg via INTRAVENOUS

## 2021-04-16 MED ORDER — PHENYLEPHRINE HCL (PRESSORS) 10 MG/ML IV SOLN
INTRAVENOUS | Status: DC | PRN
  Administered 2021-04-16: 100 ug via INTRAVENOUS

## 2021-04-16 MED ORDER — FENTANYL CITRATE (PF) 100 MCG/2ML IJ SOLN
25.0000 ug | INTRAMUSCULAR | Status: DC | PRN
  Administered 2021-04-16: 25 ug via INTRAVENOUS

## 2021-04-16 MED ORDER — ONDANSETRON HCL 4 MG/2ML IJ SOLN
INTRAMUSCULAR | Status: DC | PRN
  Administered 2021-04-16: 4 mg via INTRAVENOUS

## 2021-04-16 MED ORDER — EPHEDRINE 5 MG/ML INJ
INTRAVENOUS | Status: AC
  Filled 2021-04-16: qty 10

## 2021-04-16 MED ORDER — DEXMEDETOMIDINE (PRECEDEX) IN NS 20 MCG/5ML (4 MCG/ML) IV SYRINGE
PREFILLED_SYRINGE | INTRAVENOUS | Status: AC
  Filled 2021-04-16: qty 5

## 2021-04-16 MED ORDER — POTASSIUM CHLORIDE CRYS ER 20 MEQ PO TBCR
40.0000 meq | EXTENDED_RELEASE_TABLET | Freq: Once | ORAL | Status: AC
  Administered 2021-04-16: 40 meq via ORAL
  Filled 2021-04-16: qty 2

## 2021-04-16 MED ORDER — FENTANYL CITRATE (PF) 100 MCG/2ML IJ SOLN
INTRAMUSCULAR | Status: AC
  Administered 2021-04-16: 25 ug via INTRAVENOUS
  Filled 2021-04-16: qty 2

## 2021-04-16 MED ORDER — FENTANYL CITRATE (PF) 100 MCG/2ML IJ SOLN
INTRAMUSCULAR | Status: AC
  Filled 2021-04-16: qty 2

## 2021-04-16 MED ORDER — EPHEDRINE SULFATE 50 MG/ML IJ SOLN
INTRAMUSCULAR | Status: DC | PRN
  Administered 2021-04-16 (×2): 10 mg via INTRAVENOUS

## 2021-04-16 MED ORDER — BUPIVACAINE HCL (PF) 0.5 % IJ SOLN
INTRAMUSCULAR | Status: AC
  Filled 2021-04-16: qty 30

## 2021-04-16 MED ORDER — DEXMEDETOMIDINE (PRECEDEX) IN NS 20 MCG/5ML (4 MCG/ML) IV SYRINGE
PREFILLED_SYRINGE | INTRAVENOUS | Status: DC | PRN
  Administered 2021-04-16: 16 ug via INTRAVENOUS

## 2021-04-16 MED ORDER — ONDANSETRON HCL 4 MG/2ML IJ SOLN: 4.0000 mg | Freq: Once | INTRAMUSCULAR | Status: DC | PRN

## 2021-04-16 MED ORDER — LIDOCAINE HCL 1 % IJ SOLN
INTRAMUSCULAR | Status: DC | PRN
  Administered 2021-04-16: 10 mL

## 2021-04-16 MED ORDER — BUPIVACAINE HCL (PF) 0.5 % IJ SOLN
INTRAMUSCULAR | Status: DC | PRN
  Administered 2021-04-16: 10 mL

## 2021-04-16 MED ORDER — FENTANYL CITRATE (PF) 100 MCG/2ML IJ SOLN
INTRAMUSCULAR | Status: DC | PRN
  Administered 2021-04-16: 25 ug via INTRAVENOUS
  Administered 2021-04-16: 50 ug via INTRAVENOUS

## 2021-04-16 MED ORDER — MIDAZOLAM HCL 2 MG/2ML IJ SOLN
INTRAMUSCULAR | Status: DC | PRN
  Administered 2021-04-16: 2 mg via INTRAVENOUS

## 2021-04-16 SURGICAL SUPPLY — 42 items
BLADE SURG 10 STRL SS (BLADE) IMPLANT
BNDG COHESIVE 4X5 TAN STRL (GAUZE/BANDAGES/DRESSINGS) ×2 IMPLANT
BNDG ELASTIC 4X5.8 VLCR NS LF (GAUZE/BANDAGES/DRESSINGS) ×2 IMPLANT
BNDG ESMARK 4X12 TAN STRL LF (GAUZE/BANDAGES/DRESSINGS) ×2 IMPLANT
BNDG GAUZE 4.5X4.1 6PLY STRL (MISCELLANEOUS) ×2 IMPLANT
BNDG STRETCH 4X75 STRL LF (GAUZE/BANDAGES/DRESSINGS) ×2 IMPLANT
COVER WAND RF STERILE (DRAPES) ×2 IMPLANT
CUFF TOURN SGL QUICK 18X4 (TOURNIQUET CUFF) ×2 IMPLANT
CUFF TOURN SGL QUICK 24 (TOURNIQUET CUFF) ×1
CUFF TRNQT CYL 24X4X16.5-23 (TOURNIQUET CUFF) ×1 IMPLANT
DRAIN PENROSE 1/4X12 LTX STRL (WOUND CARE) IMPLANT
DRAPE FLUOR MINI C-ARM 54X84 (DRAPES) IMPLANT
DRSG EMULSION OIL 3X8 NADH (GAUZE/BANDAGES/DRESSINGS) IMPLANT
DURAPREP 26ML APPLICATOR (WOUND CARE) ×2 IMPLANT
ELECT REM PT RETURN 9FT ADLT (ELECTROSURGICAL) ×2
ELECTRODE REM PT RTRN 9FT ADLT (ELECTROSURGICAL) ×1 IMPLANT
GAUZE PACKING IODOFORM 1/2 (PACKING) IMPLANT
GAUZE SPONGE 4X4 12PLY STRL (GAUZE/BANDAGES/DRESSINGS) ×4 IMPLANT
GLOVE BIOGEL M 8.0 STRL (GLOVE) ×2 IMPLANT
GLOVE SRG 8 PF TXTR STRL LF DI (GLOVE) ×1 IMPLANT
GLOVE SURG UNDER POLY LF SZ8 (GLOVE) ×1
GOWN STRL REUS W/ TWL XL LVL3 (GOWN DISPOSABLE) ×1 IMPLANT
GOWN STRL REUS W/TWL XL LVL3 (GOWN DISPOSABLE) ×1
HANDLE YANKAUER SUCT BULB TIP (MISCELLANEOUS) IMPLANT
KIT TURNOVER KIT A (KITS) ×2 IMPLANT
LABEL OR SOLS (LABEL) IMPLANT
MANIFOLD NEPTUNE II (INSTRUMENTS) ×2 IMPLANT
NDL SAFETY ECLIPSE 18X1.5 (NEEDLE) ×1 IMPLANT
NEEDLE FILTER BLUNT 18X 1/2SAF (NEEDLE) ×1
NEEDLE FILTER BLUNT 18X1 1/2 (NEEDLE) ×1 IMPLANT
NEEDLE HYPO 18GX1.5 SHARP (NEEDLE) ×1
NS IRRIG 500ML POUR BTL (IV SOLUTION) ×2 IMPLANT
PACK EXTREMITY ARMC (MISCELLANEOUS) ×2 IMPLANT
PAD ABD DERMACEA PRESS 5X9 (GAUZE/BANDAGES/DRESSINGS) ×4 IMPLANT
SOL PREP PVP 2OZ (MISCELLANEOUS) ×2
SOLUTION PREP PVP 2OZ (MISCELLANEOUS) ×1 IMPLANT
SPONGE LAP 18X18 RF (DISPOSABLE) IMPLANT
STAPLER SKIN PROX 35W (STAPLE) IMPLANT
STOCKINETTE M/LG 89821 (MISCELLANEOUS) ×2 IMPLANT
SUT PROLENE 3 0 PS 2 (SUTURE) IMPLANT
SWAB CULTURE AMIES ANAERIB BLU (MISCELLANEOUS) IMPLANT
SYR 10ML LL (SYRINGE) ×4 IMPLANT

## 2021-04-16 NOTE — Op Note (Signed)
OPERATIVE REPORT Patient name: [name] MRN: [id number] DOB: 1966-02-06  DOS:  04/16/2021  Preop Dx: osteomyelitis RT foot Postop Dx: osteomyelitis RT foot. Abscess RT foot.   Procedure:  1. Fifth Ray amputation RT foot 2. Incision and drainage RT foot  Surgeon: Edrick Kins DPM  Anesthesia: 50-50 mixture of 2% lidocaine plain with 0.5% Marcaine plain totaling 3mL infiltrated in the patient's RT lower extremity in a high ankle block fashion  Hemostasis: Calf tourniquet inflated to a pressure of 273mmHg without esmarch exsanguination   EBL: 50 mL Materials: none Injectables: none  Pathology: deep culture taken and sent to pathology for culture and senitivities  Condition: The patient tolerated the procedure and anesthesia well. No complications noted or reported   Justification for procedure: The patient is a 55 y.o. male who presents today for surgical correction of osteomyelitis and abscess right foot. All conservative modalities of been unsuccessful in providing any sort of satisfactory alleviation of symptoms with the patient. The patient was told benefits as well as possible side effects of the surgery. The patient consented for surgical correction. The patient consent form was reviewed. All patient questions were answered. No guarantees were expressed or implied. The patient and the surgeon both signed the patient consent form with a witness present and placed in the patient's chart.   Procedure in Detail: The patient was brought to the operating room, placed in the operating table in the supine position at which time an aseptic scrub and drape were performed about the patient's respective lower extremity after anesthesia was induced as described above. Attention was then directed to the surgical area where procedure number one commenced.  Procedure #1: Fifth Ray amputation RT foot Along elliptical type incision was planned and made overlying the fifth ray of the  right foot using a #10 scalpel blade.  As soon as the incision was made there was a heavy amount of purulence noted draining from the incision site.  The incision was made circumferentially around the fifth ray and the entire fifth ray including the fifth digit, and fifth metatarsal were freed from the surrounding soft tissues and removed in toto and sent to pathology.  Again, heavy purulence was noted throughout the compartments of the foot both dorsal and plantar, especially plantar, and attention was directed to the purulence and soft tissues around the foot where procedure 2 commenced.  Procedure #2: Incision and Drainage RT foot Within the amputation site previously noted, blunt soft tissue dissection using a curved Metzenbaum scissors was performed to penetrate all compartments of the foot.  Heavy purulence was noted along the plantar compartments of the foot.  Any necrotic nonviable tissue was surgically excised and removed.  There was an extensive amount of soft tissue that was nonviable throughout the plantar aspect of the foot.  Copious irrigation was then utilized using 3 L normal saline and pulse lavage.  After pulse lavage, the amputation site was evaluated and any additional nonviable tissue or debris was surgically excised.  Decision was made to leave the amputation site open to allow for drainage and not allow for reaccumulation of abscess.  Saline soaked gauze was packed within the amputation site.  Dry sterile compressive dressings were then applied to the patient's lower extremity. The tourniquet which was used for hemostasis was deflated. Neurovascular responses returned to the patient's lower extremity.  The patient was then transferred from the operating room to the recovery room having tolerated the procedure and anesthesia well. All vital  signs are stable. After a brief stay in the recovery room the patient was readmitted with adequate prescriptions for analgesia. Verbal as well as  written instructions were provided for the patient regarding wound care. The patient is to keep the dressings clean dry and intact.   Edrick Kins, DPM [facility]  Dr. Edrick Kins, DPM    [address],  [address]                Office [phone]  Fax [phone]

## 2021-04-16 NOTE — Brief Op Note (Signed)
04/16/2021  2:21 PM  PATIENT:  [name]  55 y.o. male  PRE-OPERATIVE DIAGNOSIS:  Right foot osteomylelitis  POST-OPERATIVE DIAGNOSIS:  Right foot osteomylelitis  PROCEDURE:  Procedure(s): AMPUTATION  RIGHT 5TH RAY, INCISION AND DRAINAGE (Right)  SURGEON:  Surgeon(s) and Role: Edrick Kins, DPM - Primary  PHYSICIAN ASSISTANT: None  ASSISTANTS: None  ANESTHESIA:   local  EBL:  50 mL  BLOOD ADMINISTERED:none  DRAINS: none   LOCAL MEDICATIONS USED:  MARCAINE    and LIDOCAINE   SPECIMEN:  Source of Specimen:  Cultures abscess RT foot  DISPOSITION OF SPECIMEN:  PATHOLOGY  COUNTS:  YES  TOURNIQUET:  * Missing tourniquet times found for documented tourniquets in log: [id number] *  DICTATION: .Dragon Dictation  PLAN OF CARE: Admit to inpatient   PATIENT DISPOSITION:  PACU - hemodynamically stable.   Delay start of Pharmacological VTE agent (>24hrs) due to surgical blood loss or risk of bleeding: no  Edrick Kins, DPM [facility]  Dr. Edrick Kins, DPM    [address], [place] [address]                Office [phone]  Fax [phone]

## 2021-04-16 NOTE — Transfer of Care (Signed)
Immediate Anesthesia Transfer of Care Note  Patient: [name]  Procedure(s) Performed: Procedure(s): AMPUTATION  RIGHT 5TH RAY, INCISION AND DRAINAGE (Right)  Patient Location: PACU  Anesthesia Type:General  Level of Consciousness: sedated  Airway & Oxygen Therapy: Patient Spontanous Breathing and Patient connected to face mask oxygen  Post-op Assessment: Report given to RN and Post -op Vital signs reviewed and stable  Post vital signs: Reviewed and stable  Last Vitals:  Vitals:   04/16/21 0803 04/16/21 1418  BP: 131/68 (!) 94/57  Pulse: 73 81  Resp:  20  Temp: 37.2 C 37.1 C  SpO2: 86% 75%    Complications: No apparent anesthesia complications

## 2021-04-16 NOTE — Plan of Care (Signed)
Patient had an uneventful shift. No changes in neurological and neurovascular assessments. No complaints of pain.Vital signs within normal range except for elevated temperature x 1, resolved with tylenol. Denies any needs at this time. All Safety measures maintained. NPO for OR.Care continues.  Problem: Education: Goal: Knowledge of General Education information will improve Description: Including pain rating scale, medication(s)/side effects and non-pharmacologic comfort measures Outcome: Progressing   Problem: Health Behavior/Discharge Planning: Goal: Ability to manage health-related needs will improve Outcome: Progressing   Problem: Clinical Measurements: Goal: Ability to maintain clinical measurements within normal limits will improve Outcome: Progressing Goal: Will remain free from infection Outcome: Progressing Goal: Diagnostic test results will improve Outcome: Progressing Goal: Respiratory complications will improve Outcome: Progressing Goal: Cardiovascular complication will be avoided Outcome: Progressing   Problem: Activity: Goal: Risk for activity intolerance will decrease Outcome: Progressing   Problem: Nutrition: Goal: Adequate nutrition will be maintained Outcome: Progressing   Problem: Coping: Goal: Level of anxiety will decrease Outcome: Progressing   Problem: Elimination: Goal: Will not experience complications related to bowel motility Outcome: Progressing Goal: Will not experience complications related to urinary retention Outcome: Progressing   Problem: Pain Managment: Goal: General experience of comfort will improve Outcome: Progressing   Problem: Safety: Goal: Ability to remain free from injury will improve Outcome: Progressing   Problem: Skin Integrity: Goal: Risk for impaired skin integrity will decrease Outcome: Progressing

## 2021-04-16 NOTE — OR Nursing (Signed)
Foot was packed with 8 saline soaked 4x4's by Dr. Amalia Hailey

## 2021-04-16 NOTE — Interval H&P Note (Signed)
History and Physical Interval Note:  04/16/2021 2:54 PM  [name]  has presented today for surgery, with the diagnosis of Right foot osteomylelitis.  The various methods of treatment have been discussed with the patient and family. After consideration of risks, benefits and other options for treatment, the patient has consented to  Procedure(s): AMPUTATION  RIGHT 5TH RAY, INCISION AND DRAINAGE (Right) as a surgical intervention.  The patient's history has been reviewed, patient examined, no change in status, stable for surgery.  I have reviewed the patient's chart and labs.  Questions were answered to the patient's satisfaction.     Edrick Kins

## 2021-04-16 NOTE — Progress Notes (Signed)
Patient ID: [name], male   DOB: Jan 31, 1966, 55 y.o.   MRN: [id number]  PROGRESS NOTE    ELIGIO ANGERT  JSE:[id number] DOB: [birth year] DOA: 04/12/2021 PCP: [name], MD   Brief Narrative:   55 y.o. male with medical history significant for DM type II, HTN, PAD with history of left partial fifth metatarsal base resection July 2021 after presenting with gangrene presented with worsening right foot pain and worsening foot ulcer despite undergoing excisional debridement on 04/05/2021 by podiatry and oral doxycycline.  On presentation, he had WBCs of 26,000 with creatinine of 1.38, up from 1.02 which is his baseline.  X-ray of the right foot showed soft tissue ulceration of lateral aspect of midfoot with no evidence of underlying osteomyelitis with diffuse soft tissue edema.  He was started on broad-spectrum antibiotics.  Podiatry and vascular surgery were consulted.  Assessment & Plan:   Cellulitis of right foot/diabetic right foot ulcer infection with osteomyelitis PAD with history of left metatarsal base amputation Leukocytosis -Patient presented with worsening right foot infected ulcer despite undergoing wound debridement on 04/05/2021 and outpatient doxycycline -Currently on broad-spectrum antibiotics Rocephin, Flagyl and vancomycin.   -MRI of the right foot is consistent with osteomyelitis.  Podiatry and vascular surgery following  -Status post aortogram and right lower extremity angiogram done by vascular surgery on 04/14/2021.  Vascular surgery has signed off.  -T-max of 101.5 over the last 24 hours.  Blood cultures negative so far -Continue rosuvastatin for PAD.  Not on antiplatelets -Still has significant leukocytosis.  Monitor -Podiatry is planning for surgical intervention today.  Diabetes mellitus type II with hyperglycemia -A1c 9.9.  Continue CBGs with SSI.  Continue Levemir.  Hypertension -Monitor blood pressure.  Continue irbesartan/hydrochlorothiazide/nebivolol  Acute  kidney injury -Presented with creatinine of 1.38: Baseline of around 1 to 2 months ago.  Treated with IV fluids.  Resolved.  Anemia -Unknown cause.  Hemoglobin stable.  Outpatient follow-up.  Hypokalemia -Replace.  Repeat a.m. labs  DVT prophylaxis: SCDs Code Status: Full Family Communication: Wife at bedside on 04/14/2021 Disposition Plan: Status is: Inpatient  Remains inpatient appropriate because:Inpatient level of care appropriate due to severity of illness   Dispo: The patient is from: Home              Anticipated d/c is to: Home              Patient currently is not medically stable to d/c.   Difficult to place patient No  Consultants: Podiatry/vascular surgery  Procedures:   aortogram and right lower extremity angiogram done by vascular surgery on 04/14/2021  Antimicrobials: Rocephin and vancomycin from 04/12/2021 onwards.  Flagyl from 04/13/2021 onwards   Subjective: Patient seen and examined at bedside.  Patient had temperature spike last night.  No vomiting, abdominal pain, diarrhea reported.  Still complains of intermittent right foot pain.    Objective: Vitals:   04/15/21 2102 04/15/21 2325 04/16/21 0129 04/16/21 0632  BP: (!) 120/55 (!) 116/59 (!) 142/78 129/68  Pulse: 77 69 70 72  Resp: 18 20 18 20   Temp: (!) 101.5 F (38.6 C) 98.6 F (37 C) 98.6 F (37 C) 99 F (37.2 C)  TempSrc: Oral Oral Oral   SpO2: 95% 96% 100% 97%  Weight:      Height:        Intake/Output Summary (Last 24 hours) at 04/16/2021 0738 Last data filed at 04/16/2021 0349 Gross per 24 hour  Intake 1200 ml  Output 1000 ml  Net 200 ml   Filed Weights   04/13/21 0116 04/14/21 1640  Weight: 90.8 kg 90.8 kg    Examination:  General exam: No acute distress.  Sleepy, wakes up slightly, answers a few questions.  On room air currently. Respiratory system: Decreased breath sounds at bases bilaterally, no wheezing cardiovascular system: Rate controlled, S1-S2 heard gastrointestinal system:  Abdomen is mildly distended, soft and nontender.  Bowel sounds are heard  extremities: Mild lower extremity edema present; no clubbing  Data Reviewed: I have personally reviewed following labs and imaging studies  CBC: Recent Labs  Lab 04/12/21 1631 04/13/21 0511 04/14/21 0547 04/15/21 0527 04/16/21 0530  WBC 26.8* 25.6* 28.8* 24.3* 24.3*  NEUTROABS 23.7*  --  24.9* 20.8* 20.7*  HGB 11.1* 9.7* 9.1* 9.0* 9.0*  HCT 33.5* 29.6* 27.5* 27.0* 26.5*  MCV 91.5 92.8 91.7 91.8 91.4  PLT 420* 343 361 417* 952   Basic Metabolic Panel: Recent Labs  Lab 04/12/21 1631 04/13/21 0511 04/14/21 0547 04/15/21 0527 04/16/21 0530  NA 132* 135 133* 137 136  K 3.9 3.6 3.1* 3.5 3.3*  CL 95* 101 101 107 107  CO2 18* 18* 20* 21* 21*  GLUCOSE 417* 236* 171* 130* 141*  BUN 53* 50* 42* 35* 30*  CREATININE 1.38* 1.19 1.05 0.87 0.93  CALCIUM 9.3 8.6* 8.6* 8.7* 8.5*  MG  --   --  2.2 2.1 1.9   GFR: Estimated Creatinine Clearance: 99.7 mL/min (by C-G formula based on SCr of 0.93 mg/dL). Liver Function Tests: Recent Labs  Lab 04/12/21 1631 04/13/21 0511  AST 12* 8*  ALT 11 9  ALKPHOS 86 75  BILITOT 3.7* 2.6*  PROT 8.0 6.6  ALBUMIN 2.7* 2.0*   No results for input(s): LIPASE, AMYLASE in the last 168 hours. No results for input(s): AMMONIA in the last 168 hours. Coagulation Profile: No results for input(s): INR, PROTIME in the last 168 hours. Cardiac Enzymes: No results for input(s): CKTOTAL, CKMB, CKMBINDEX, TROPONINI in the last 168 hours. BNP (last 3 results) No results for input(s): PROBNP in the last 8760 hours. HbA1C: No results for input(s): HGBA1C in the last 72 hours. CBG: Recent Labs  Lab 04/14/21 2124 04/15/21 0804 04/15/21 1117 04/15/21 1643 04/15/21 2128  GLUCAP 143* 126* 177* 166* 173*   Lipid Profile: No results for input(s): CHOL, HDL, LDLCALC, TRIG, CHOLHDL, LDLDIRECT in the last 72 hours. Thyroid Function Tests: No results for input(s): TSH, T4TOTAL, FREET4,  T3FREE, THYROIDAB in the last 72 hours. Anemia Panel: No results for input(s): VITAMINB12, FOLATE, FERRITIN, TIBC, IRON, RETICCTPCT in the last 72 hours. Sepsis Labs: Recent Labs  Lab 04/12/21 1631 04/12/21 2339  LATICACIDVEN 1.8 1.1    Recent Results (from the past 240 hour(s))  SARS CORONAVIRUS 2 (TAT 6-24 HRS) Nasopharyngeal Nasopharyngeal Swab     Status: None   Collection Time: 04/12/21  6:30 PM   Specimen: Nasopharyngeal Swab  Result Value Ref Range Status   SARS Coronavirus 2 NEGATIVE NEGATIVE Final    Comment: (NOTE) SARS-CoV-2 target nucleic acids are NOT DETECTED.  The SARS-CoV-2 RNA is generally detectable in upper and lower respiratory specimens during the acute phase of infection. Negative results do not preclude SARS-CoV-2 infection, do not rule out co-infections with other pathogens, and should not be used as the sole basis for treatment or other patient management decisions. Negative results must be combined with clinical observations, patient history, and epidemiological information. The expected result is Negative.  Fact Sheet for Patients: SugarRoll.be  Fact  Sheet for Healthcare Providers: https://www.woods-mathews.com/  This test is not yet approved or cleared by the [place] FDA and  has been authorized for detection and/or diagnosis of SARS-CoV-2 by FDA under an Emergency Use Authorization (EUA). This EUA will remain  in effect (meaning this test can be used) for the duration of the COVID-19 declaration under Se ction 564(b)(1) of the Act, 21 U.S.C. section 360bbb-3(b)(1), unless the authorization is terminated or revoked sooner.  Performed at [facility], [place] [address], [place], [place] [address]   Aerobic Culture w Gram Stain (superficial specimen)     Status: None (Preliminary result)   Collection Time: 04/13/21  8:56 AM   Specimen: Wound  Result Value Ref Range Status   Specimen  Description WOUND RIGHT FOOT  Final   Special Requests NONE  Final   Gram Stain   Final    FEW WBC PRESENT,BOTH PMN AND MONONUCLEAR FEW GRAM POSITIVE COCCI RARE GRAM VARIABLE ROD    Culture   Final    CULTURE REINCUBATED FOR BETTER GROWTH Performed at [facility], [place] [address] [place], [place] [address]    Report Status PENDING  Incomplete  CULTURE, BLOOD (ROUTINE X 2) w Reflex to ID Panel     Status: None (Preliminary result)   Collection Time: 04/14/21  8:27 AM   Specimen: BLOOD  Result Value Ref Range Status   Specimen Description BLOOD LEFT HAND  Final   Special Requests   Final    BOTTLES DRAWN AEROBIC AND ANAEROBIC Blood Culture adequate volume   Culture   Final    NO GROWTH 2 DAYS Performed at [facility], [address], [place], [place] [address]    Report Status PENDING  Incomplete  CULTURE, BLOOD (ROUTINE X 2) w Reflex to ID Panel     Status: None (Preliminary result)   Collection Time: 04/14/21  8:28 AM   Specimen: BLOOD  Result Value Ref Range Status   Specimen Description BLOOD  RIGHT HAND  Final   Special Requests   Final    BOTTLES DRAWN AEROBIC AND ANAEROBIC Blood Culture adequate volume   Culture   Final    NO GROWTH 2 DAYS Performed at [facility], [address], [place], [place] [address]    Report Status PENDING  Incomplete         Radiology Studies: PERIPHERAL VASCULAR CATHETERIZATION  Result Date: 04/14/2021 See op note       Scheduled Meds: . gentamicin ointment   Topical BID  . irbesartan  300 mg Oral Daily   And  . hydrochlorothiazide  25 mg Oral Daily  . insulin aspart  0-15 Units Subcutaneous TID WC  . insulin aspart  0-5 Units Subcutaneous QHS  . insulin detemir  15 Units Subcutaneous Daily  . nebivolol  5 mg Oral Daily  . rosuvastatin  40 mg Oral Daily   Continuous Infusions: . cefTRIAXone (ROCEPHIN)  IV 1 g (04/16/21 0407)  . metronidazole 500 mg (04/16/21 2297)  . vancomycin Stopped  (04/16/21 0215)          Aline August, MD [facility] 04/16/2021, 7:38 AM

## 2021-04-16 NOTE — Anesthesia Postprocedure Evaluation (Signed)
Anesthesia Post Note  Patient: RHONIN TROTT  Procedure(s) Performed: AMPUTATION  RIGHT 5TH RAY, INCISION AND DRAINAGE (Right )  Patient location during evaluation: PACU Anesthesia Type: General Level of consciousness: awake and alert and oriented Pain management: pain level controlled Vital Signs Assessment: post-procedure vital signs reviewed and stable Respiratory status: spontaneous breathing Cardiovascular status: blood pressure returned to baseline Anesthetic complications: no   No complications documented.   Last Vitals:  Vitals:   04/16/21 1540 04/16/21 1703  BP: 123/69 117/64  Pulse: 70 72  Resp: 16 16  Temp: 36.8 C 36.5 C  SpO2: 97% 99%    Last Pain:  Vitals:   04/16/21 1703  TempSrc: Oral  PainSc:                  Cederic Mozley

## 2021-04-16 NOTE — Anesthesia Procedure Notes (Signed)
Procedure Name: LMA Insertion Date/Time: 04/16/2021 1:23 PM Performed by: Doreen Salvage, CRNA Pre-anesthesia Checklist: Patient identified, Patient being monitored, Timeout performed, Emergency Drugs available and Suction available Patient Re-evaluated:Patient Re-evaluated prior to induction Oxygen Delivery Method: Circle system utilized Preoxygenation: Pre-oxygenation with 100% oxygen Induction Type: IV induction Ventilation: Mask ventilation without difficulty LMA: LMA inserted LMA Size: 4.0 Tube type: Oral Number of attempts: 1 Placement Confirmation: positive ETCO2 and breath sounds checked- equal and bilateral Tube secured with: Tape Dental Injury: Teeth and Oropharynx as per pre-operative assessment

## 2021-04-17 ENCOUNTER — Encounter: Payer: Self-pay | Admitting: Podiatry

## 2021-04-17 DIAGNOSIS — I10 Essential (primary) hypertension: Secondary | ICD-10-CM

## 2021-04-17 DIAGNOSIS — M869 Osteomyelitis, unspecified: Secondary | ICD-10-CM

## 2021-04-17 LAB — VANCOMYCIN, TROUGH: Vancomycin Tr: 31 ug/mL (ref 15–20)

## 2021-04-17 LAB — BASIC METABOLIC PANEL
Anion gap: 10 (ref 5–15)
BUN: 32 mg/dL — ABNORMAL HIGH (ref 6–20)
CO2: 18 mmol/L — ABNORMAL LOW (ref 22–32)
Calcium: 8.4 mg/dL — ABNORMAL LOW (ref 8.9–10.3)
Chloride: 107 mmol/L (ref 98–111)
Creatinine, Ser: 0.99 mg/dL (ref 0.61–1.24)
GFR, Estimated: >60 mL/min (ref >60)
Glucose, Bld: 251 mg/dL — ABNORMAL HIGH (ref 70–99)
Potassium: 4.1 mmol/L (ref 3.5–5.1)
Sodium: 135 mmol/L (ref 135–145)

## 2021-04-17 LAB — AEROBIC CULTURE W GRAM STAIN (SUPERFICIAL SPECIMEN)

## 2021-04-17 LAB — GLUCOSE, CAPILLARY
Glucose-Capillary: 218 mg/dL — ABNORMAL HIGH (ref 70–99)
Glucose-Capillary: 216 mg/dL — ABNORMAL HIGH (ref 70–99)
Glucose-Capillary: 221 mg/dL — ABNORMAL HIGH (ref 70–99)
Glucose-Capillary: 287 mg/dL — ABNORMAL HIGH (ref 70–99)

## 2021-04-17 LAB — CBC WITH DIFFERENTIAL/PLATELET
Abs Immature Granulocytes: 0.75 10*3/uL — ABNORMAL HIGH (ref 0.00–0.07)
Basophils Absolute: 0.1 10*3/uL (ref 0.0–0.1)
Basophils Relative: 0 %
Eosinophils Absolute: 0 10*3/uL (ref 0.0–0.5)
Eosinophils Relative: 0 %
HCT: 25.3 % — ABNORMAL LOW (ref 39.0–52.0)
Hemoglobin: 8.2 g/dL — ABNORMAL LOW (ref 13.0–17.0)
Immature Granulocytes: 3 %
Lymphocytes Relative: 3 %
Lymphs Abs: 0.8 10*3/uL (ref 0.7–4.0)
MCH: 30.7 pg (ref 26.0–34.0)
MCHC: 32.4 g/dL (ref 30.0–36.0)
MCV: 94.8 fL (ref 80.0–100.0)
Monocytes Absolute: 1.8 10*3/uL — ABNORMAL HIGH (ref 0.1–1.0)
Monocytes Relative: 6 %
Neutro Abs: 24.5 10*3/uL — ABNORMAL HIGH (ref 1.7–7.7)
Neutrophils Relative %: 88 %
Platelets: 397 10*3/uL (ref 150–400)
RBC: 2.67 MIL/uL — ABNORMAL LOW (ref 4.22–5.81)
RDW: 12.1 % (ref 11.5–15.5)
WBC: 27.8 10*3/uL — ABNORMAL HIGH (ref 4.0–10.5)
nRBC: 0 % (ref 0.0–0.2)

## 2021-04-17 LAB — VANCOMYCIN, PEAK: Vancomycin Pk: 40 ug/mL (ref 30–40)

## 2021-04-17 LAB — C-REACTIVE PROTEIN: CRP: 20.8 mg/dL — ABNORMAL HIGH (ref <1.0)

## 2021-04-17 LAB — MAGNESIUM: Magnesium: 2 mg/dL (ref 1.7–2.4)

## 2021-04-17 NOTE — Plan of Care (Signed)
  Problem: Education: Goal: Knowledge of General Education information will improve Description: Including pain rating scale, medication(s)/side effects and non-pharmacologic comfort measures 04/17/2021 0535 by Windell Norfolk, RN Outcome: Progressing 04/17/2021 0534 by Windell Norfolk, RN Outcome: Progressing   Problem: Health Behavior/Discharge Planning: Goal: Ability to manage health-related needs will improve 04/17/2021 0535 by Windell Norfolk, RN Outcome: Progressing 04/17/2021 0534 by Windell Norfolk, RN Outcome: Progressing   Problem: Clinical Measurements: Goal: Ability to maintain clinical measurements within normal limits will improve 04/17/2021 0535 by Windell Norfolk, RN Outcome: Progressing 04/17/2021 0534 by Windell Norfolk, RN Outcome: Progressing Goal: Will remain free from infection 04/17/2021 0535 by Windell Norfolk, RN Outcome: Progressing 04/17/2021 0534 by Windell Norfolk, RN Outcome: Progressing Goal: Diagnostic test results will improve 04/17/2021 0535 by Windell Norfolk, RN Outcome: Progressing 04/17/2021 0534 by Windell Norfolk, RN Outcome: Progressing Goal: Respiratory complications will improve 04/17/2021 0535 by Windell Norfolk, RN Outcome: Progressing 04/17/2021 0534 by Windell Norfolk, RN Outcome: Progressing Goal: Cardiovascular complication will be avoided 04/17/2021 0535 by Windell Norfolk, RN Outcome: Progressing 04/17/2021 0534 by Windell Norfolk, RN Outcome: Progressing   Problem: Activity: Goal: Risk for activity intolerance will decrease 04/17/2021 0535 by Windell Norfolk, RN Outcome: Progressing 04/17/2021 0534 by Windell Norfolk, RN Outcome: Progressing   Problem: Nutrition: Goal: Adequate nutrition will be maintained 04/17/2021 0535 by Windell Norfolk, RN Outcome: Progressing 04/17/2021 0534 by Windell Norfolk, RN Outcome: Progressing   Problem: Coping: Goal: Level of anxiety will decrease 04/17/2021 0535 by Windell Norfolk, RN Outcome:  Progressing 04/17/2021 0534 by Windell Norfolk, RN Outcome: Progressing   Problem: Elimination: Goal: Will not experience complications related to bowel motility 04/17/2021 0535 by Windell Norfolk, RN Outcome: Progressing 04/17/2021 0534 by Windell Norfolk, RN Outcome: Progressing Goal: Will not experience complications related to urinary retention 04/17/2021 0535 by Windell Norfolk, RN Outcome: Progressing 04/17/2021 0534 by Windell Norfolk, RN Outcome: Progressing   Problem: Pain Managment: Goal: General experience of comfort will improve 04/17/2021 0535 by Windell Norfolk, RN Outcome: Progressing 04/17/2021 0534 by Windell Norfolk, RN Outcome: Progressing   Problem: Safety: Goal: Ability to remain free from injury will improve 04/17/2021 0535 by Windell Norfolk, RN Outcome: Progressing 04/17/2021 0534 by Windell Norfolk, RN Outcome: Progressing   Problem: Skin Integrity: Goal: Risk for impaired skin integrity will decrease 04/17/2021 0535 by Windell Norfolk, RN Outcome: Progressing 04/17/2021 0534 by Windell Norfolk, RN Outcome: Progressing

## 2021-04-17 NOTE — Progress Notes (Signed)
Patient ID: [name], male   DOB: 1966/08/18, 55 y.o.   MRN: [id number]  PROGRESS NOTE    [name]  EPP:[id number] DOB: 1966-11-04 DOA: 04/12/2021 PCP: [name], MD   Brief Narrative:   55 y.o. male with medical history significant for DM type II, HTN, PAD with history of left partial fifth metatarsal base resection July 2021 after presenting with gangrene presented with worsening right foot pain and worsening foot ulcer despite undergoing excisional debridement on 04/05/2021 by podiatry and oral doxycycline.  On presentation, he had WBCs of 26,000 with creatinine of 1.38, up from 1.02 which is his baseline.  X-ray of the right foot showed soft tissue ulceration of lateral aspect of midfoot with no evidence of underlying osteomyelitis with diffuse soft tissue edema.  He was started on broad-spectrum antibiotics.  Podiatry and vascular surgery were consulted.  Assessment & Plan:   Cellulitis of right foot/diabetic right foot ulcer infection with osteomyelitis PAD with history of left metatarsal base amputation Leukocytosis -Patient presented with worsening right foot infected ulcer despite undergoing wound debridement on 04/05/2021 and outpatient doxycycline -Currently on broad-spectrum antibiotics Rocephin, Flagyl and vancomycin.   -MRI of the right foot was consistent with osteomyelitis. -Status post aortogram and right lower extremity angiogram done by vascular surgery on 04/14/2021.  Vascular surgery has signed off.  -No temperature spikes over the last 24 hours.  Blood cultures negative so far -Continue rosuvastatin for PAD.  Not on antiplatelets -Still has significant leukocytosis.  Monitor -Status post fifth ray amputation of right foot and I&D of right foot on 04/16/2021 by podiatry.  Follow or cultures.  Wound care as per podiatry recommendations.  Follow further podiatry recommendations.  Diabetes mellitus type II with hyperglycemia -A1c 9.9.  Continue CBGs with SSI.  Continue  Levemir.  Hypertension -Monitor blood pressure.  Continue irbesartan/hydrochlorothiazide/nebivolol  Acute kidney injury -Presented with creatinine of 1.38: Baseline of around 1 to 2 months ago.  Treated with IV fluids.  Resolved.  Anemia -Unknown cause.  Hemoglobin stable.  Outpatient follow-up.  Hypokalemia -Improved  DVT prophylaxis: SCDs Code Status: Full Family Communication: Wife at bedside on 04/14/2021 Disposition Plan: Status is: Inpatient  Remains inpatient appropriate because:Inpatient level of care appropriate due to severity of illness   Dispo: The patient is from: Home              Anticipated d/c is to: Home              Patient currently is not medically stable to d/c.   Difficult to place patient No  Consultants: Podiatry/vascular surgery  Procedures:   aortogram and right lower extremity angiogram done by vascular surgery on 04/14/2021 fifth ray amputation of right foot and I&D of right foot on 04/16/2021   Antimicrobials: Rocephin and vancomycin from 04/12/2021 onwards.  Flagyl from 04/13/2021 onwards   Subjective: Patient seen and examined at bedside.  No overnight fever, vomiting or abdominal pain reported.  Complains of right foot pain.   Objective: Vitals:   04/16/21 1703 04/16/21 1943 04/17/21 0008 04/17/21 0436  BP: 117/64 115/71 112/68 (!) 108/59  Pulse: 72 70 66 66  Resp: 16 18 18 18   Temp: 97.7 F (36.5 C) 98 F (36.7 C) 98.1 F (36.7 C) 98 F (36.7 C)  TempSrc: Oral     SpO2: 99% 97% 99% 97%  Weight:      Height:        Intake/Output Summary (Last 24 hours) at 04/17/2021 0750 Last  data filed at 04/17/2021 0600 Gross per 24 hour  Intake 1878.38 ml  Output 2000 ml  Net -121.62 ml   Filed Weights   04/13/21 0116 04/14/21 1640  Weight: 90.8 kg 90.8 kg    Examination:  General exam: On room air currently.  No distress.   Respiratory system: Bilateral decreased breath sounds at bases  cardiovascular system: S1-S2 heard, rate  controlled gastrointestinal system: Abdomen is distended slightly, soft and nontender.  Normal bowel sounds heard  extremities: No clubbing; trace lower extremity edema present.  Right foot dressing present  Data Reviewed: I have personally reviewed following labs and imaging studies  CBC: Recent Labs  Lab 04/12/21 1631 04/13/21 0511 04/14/21 0547 04/15/21 0527 04/16/21 0530 04/17/21 0535  WBC 26.8* 25.6* 28.8* 24.3* 24.3* 27.8*  NEUTROABS 23.7*  --  24.9* 20.8* 20.7* 24.5*  HGB 11.1* 9.7* 9.1* 9.0* 9.0* 8.2*  HCT 33.5* 29.6* 27.5* 27.0* 26.5* 25.3*  MCV 91.5 92.8 91.7 91.8 91.4 94.8  PLT 420* 343 361 417* 395 562   Basic Metabolic Panel: Recent Labs  Lab 04/13/21 0511 04/14/21 0547 04/15/21 0527 04/16/21 0530 04/17/21 0535  NA 135 133* 137 136 135  K 3.6 3.1* 3.5 3.3* 4.1  CL 101 101 107 107 107  CO2 18* 20* 21* 21* 18*  GLUCOSE 236* 171* 130* 141* 251*  BUN 50* 42* 35* 30* 32*  CREATININE 1.19 1.05 0.87 0.93 0.99  CALCIUM 8.6* 8.6* 8.7* 8.5* 8.4*  MG  --  2.2 2.1 1.9 2.0   GFR: Estimated Creatinine Clearance: 93.6 mL/min (by C-G formula based on SCr of 0.99 mg/dL). Liver Function Tests: Recent Labs  Lab 04/12/21 1631 04/13/21 0511  AST 12* 8*  ALT 11 9  ALKPHOS 86 75  BILITOT 3.7* 2.6*  PROT 8.0 6.6  ALBUMIN 2.7* 2.0*   No results for input(s): LIPASE, AMYLASE in the last 168 hours. No results for input(s): AMMONIA in the last 168 hours. Coagulation Profile: No results for input(s): INR, PROTIME in the last 168 hours. Cardiac Enzymes: No results for input(s): CKTOTAL, CKMB, CKMBINDEX, TROPONINI in the last 168 hours. BNP (last 3 results) No results for input(s): PROBNP in the last 8760 hours. HbA1C: No results for input(s): HGBA1C in the last 72 hours. CBG: Recent Labs  Lab 04/16/21 0814 04/16/21 1147 04/16/21 1438 04/16/21 1612 04/16/21 2105  GLUCAP 146* 131* 131* 155* 276*   Lipid Profile: No results for input(s): CHOL, HDL, LDLCALC,  TRIG, CHOLHDL, LDLDIRECT in the last 72 hours. Thyroid Function Tests: No results for input(s): TSH, T4TOTAL, FREET4, T3FREE, THYROIDAB in the last 72 hours. Anemia Panel: No results for input(s): VITAMINB12, FOLATE, FERRITIN, TIBC, IRON, RETICCTPCT in the last 72 hours. Sepsis Labs: Recent Labs  Lab 04/12/21 1631 04/12/21 2339  LATICACIDVEN 1.8 1.1    Recent Results (from the past 240 hour(s))  SARS CORONAVIRUS 2 (TAT 6-24 HRS) Nasopharyngeal Nasopharyngeal Swab     Status: None   Collection Time: 04/12/21  6:30 PM   Specimen: Nasopharyngeal Swab  Result Value Ref Range Status   SARS Coronavirus 2 NEGATIVE NEGATIVE Final    Comment: (NOTE) SARS-CoV-2 target nucleic acids are NOT DETECTED.  The SARS-CoV-2 RNA is generally detectable in upper and lower respiratory specimens during the acute phase of infection. Negative results do not preclude SARS-CoV-2 infection, do not rule out co-infections with other pathogens, and should not be used as the sole basis for treatment or other patient management decisions. Negative results must be combined with  clinical observations, patient history, and epidemiological information. The expected result is Negative.  Fact Sheet for Patients: SugarRoll.be  Fact Sheet for Healthcare Providers: https://www.woods-mathews.com/  This test is not yet approved or cleared by the [place] FDA and  has been authorized for detection and/or diagnosis of SARS-CoV-2 by FDA under an Emergency Use Authorization (EUA). This EUA will remain  in effect (meaning this test can be used) for the duration of the COVID-19 declaration under Se ction 564(b)(1) of the Act, 21 U.S.C. section 360bbb-3(b)(1), unless the authorization is terminated or revoked sooner.  Performed at [facility], [place] [address], [place], [place] [address]   Aerobic Culture w Gram Stain (superficial specimen)     Status: None (Preliminary  result)   Collection Time: 04/13/21  8:56 AM   Specimen: Wound  Result Value Ref Range Status   Specimen Description WOUND RIGHT FOOT  Final   Special Requests NONE  Final   Gram Stain   Final    FEW WBC PRESENT,BOTH PMN AND MONONUCLEAR FEW GRAM POSITIVE COCCI RARE GRAM VARIABLE ROD    Culture   Final    RARE STREPTOCOCCUS AGALACTIAE TESTING AGAINST S. AGALACTIAE NOT ROUTINELY PERFORMED DUE TO PREDICTABILITY OF AMP/PEN/VAN SUSCEPTIBILITY. CULTURE REINCUBATED FOR BETTER GROWTH Performed at [facility], [place] [address] [place], [place] [address]    Report Status PENDING  Incomplete  CULTURE, BLOOD (ROUTINE X 2) w Reflex to ID Panel     Status: None (Preliminary result)   Collection Time: 04/14/21  8:27 AM   Specimen: BLOOD  Result Value Ref Range Status   Specimen Description BLOOD LEFT HAND  Final   Special Requests   Final    BOTTLES DRAWN AEROBIC AND ANAEROBIC Blood Culture adequate volume   Culture   Final    NO GROWTH 3 DAYS Performed at [facility], [address] [place], [place] [address]    Report Status PENDING  Incomplete  CULTURE, BLOOD (ROUTINE X 2) w Reflex to ID Panel     Status: None (Preliminary result)   Collection Time: 04/14/21  8:28 AM   Specimen: BLOOD  Result Value Ref Range Status   Specimen Description BLOOD  RIGHT HAND  Final   Special Requests   Final    BOTTLES DRAWN AEROBIC AND ANAEROBIC Blood Culture adequate volume   Culture   Final    NO GROWTH 3 DAYS Performed at [facility], [address] [place], [place] [address]    Report Status PENDING  Incomplete  Aerobic/Anaerobic Culture w Gram Stain (surgical/deep wound)     Status: None (Preliminary result)   Collection Time: 04/16/21  1:47 PM   Specimen: PATH Other; Abscess  Result Value Ref Range Status   Specimen Description ABSCESS RIGHT FOOT  Final   Special Requests SWAB SPEC A  Final   Gram Stain   Final    ABUNDANT WBC PRESENT, PREDOMINANTLY PMN RARE  GRAM POSITIVE COCCI IN PAIRS Performed at [facility], [place] [address], [place], [place] [address]    Culture PENDING  Incomplete   Report Status PENDING  Incomplete         Radiology Studies: No results found.      Scheduled Meds: . gentamicin ointment   Topical BID  . irbesartan  300 mg Oral Daily   And  . hydrochlorothiazide  25 mg Oral Daily  . insulin aspart  0-15 Units Subcutaneous TID WC  . insulin aspart  0-5 Units Subcutaneous QHS  . insulin detemir  15 Units Subcutaneous Daily  . nebivolol  5 mg Oral Daily  . rosuvastatin  40 mg Oral Daily   Continuous Infusions: . cefTRIAXone (ROCEPHIN)  IV 200 mL/hr at 04/17/21 0407  . metronidazole 500 mg (04/17/21 0627)  . vancomycin Stopped (04/17/21 0049)          Aline August, MD [facility] 04/17/2021, 7:50 AM

## 2021-04-17 NOTE — Progress Notes (Signed)
   PODIATRY PROGRESS NOTE  NAME [name] MRN [id number] DOB August 06, 1966 DOA 04/12/2021   Reason for consult: OM RT FOOT Chief Complaint  Patient presents with  . Foot Pain        Subjective: Patient s/p fifth ray amputation with incision and drainage RT foot DOS: 04/16/2021.  Patient resting comfortably in bed.  He states that he feels much better and that pressure has been alleviated from his foot.  The dressings are intact with minimal strikethrough.  Objective: Upon removal of the dressings there is significant improvement with the edema and swelling in the foot in general.  Minimal purulence noted.  No malodor noted.  Minimal bleeding noted.  Amputation site left open due to the heavy amount of purulence and necrotic debris mostly throughout the plantar compartments of the foot intraoperatively.  There is some concern with the plantar flap that does appear to have some ischemic changes and vascular compromise.  Patient has known history of peripheral vascular disease status post angiogram RLE 04/14/2021.   ASSESSMENT/PLAN OF CARE S/p 5th ray amputation with incision and drainage RT foot -I explained to the patient today this is a limb salvage condition that does not exclude him from possible below-knee amputation in the future.  Intraoperatively there was heavy abscess and purulence throughout most of the plantar compartments of the foot with extensive necrotic tissue.   -Dressings changed today.  The amputation site was packed with saline soaked gauze. - Cultures pending - Planning for return to the OR with washout and debridement and possible primary closure tentatively planned for Tuesday evening - Continue IV antibiotics -Podiatry will continue to follow    Thank you for the consult.  Please contact me directly with any questions or concerns.  Cell [phone]   Edrick Kins, DPM [facility]  Dr. Edrick Kins, DPM    [address] [place],  [address]                Office [phone]  Fax [phone]

## 2021-04-18 LAB — CBC WITH DIFFERENTIAL/PLATELET
Abs Immature Granulocytes: 0.76 10*3/uL — ABNORMAL HIGH (ref 0.00–0.07)
Basophils Absolute: 0.1 10*3/uL (ref 0.0–0.1)
Basophils Relative: 0 %
Eosinophils Absolute: 0 10*3/uL (ref 0.0–0.5)
Eosinophils Relative: 0 %
HCT: 27.7 % — ABNORMAL LOW (ref 39.0–52.0)
Hemoglobin: 9.1 g/dL — ABNORMAL LOW (ref 13.0–17.0)
Immature Granulocytes: 4 %
Lymphocytes Relative: 6 %
Lymphs Abs: 1.3 10*3/uL (ref 0.7–4.0)
MCH: 30.8 pg (ref 26.0–34.0)
MCHC: 32.9 g/dL (ref 30.0–36.0)
MCV: 93.9 fL (ref 80.0–100.0)
Monocytes Absolute: 1.5 10*3/uL — ABNORMAL HIGH (ref 0.1–1.0)
Monocytes Relative: 7 %
Neutro Abs: 17.8 10*3/uL — ABNORMAL HIGH (ref 1.7–7.7)
Neutrophils Relative %: 83 %
Platelets: 538 10*3/uL — ABNORMAL HIGH (ref 150–400)
RBC: 2.95 MIL/uL — ABNORMAL LOW (ref 4.22–5.81)
RDW: 12.2 % (ref 11.5–15.5)
WBC: 21.5 10*3/uL — ABNORMAL HIGH (ref 4.0–10.5)
nRBC: 0.1 % (ref 0.0–0.2)

## 2021-04-18 LAB — BASIC METABOLIC PANEL
Anion gap: 9 (ref 5–15)
BUN: 40 mg/dL — ABNORMAL HIGH (ref 6–20)
CO2: 20 mmol/L — ABNORMAL LOW (ref 22–32)
Calcium: 8.5 mg/dL — ABNORMAL LOW (ref 8.9–10.3)
Chloride: 105 mmol/L (ref 98–111)
Creatinine, Ser: 1.07 mg/dL (ref 0.61–1.24)
GFR, Estimated: >60 mL/min (ref >60)
Glucose, Bld: 226 mg/dL — ABNORMAL HIGH (ref 70–99)
Potassium: 3.5 mmol/L (ref 3.5–5.1)
Sodium: 134 mmol/L — ABNORMAL LOW (ref 135–145)

## 2021-04-18 LAB — GLUCOSE, CAPILLARY
Glucose-Capillary: 205 mg/dL — ABNORMAL HIGH (ref 70–99)
Glucose-Capillary: 239 mg/dL — ABNORMAL HIGH (ref 70–99)
Glucose-Capillary: 171 mg/dL — ABNORMAL HIGH (ref 70–99)
Glucose-Capillary: 223 mg/dL — ABNORMAL HIGH (ref 70–99)

## 2021-04-18 LAB — C-REACTIVE PROTEIN: CRP: 14.8 mg/dL — ABNORMAL HIGH (ref <1.0)

## 2021-04-18 LAB — MAGNESIUM: Magnesium: 2 mg/dL (ref 1.7–2.4)

## 2021-04-18 MED ORDER — VANCOMYCIN HCL 1500 MG/300ML IV SOLN
1500.0000 mg | INTRAVENOUS | Status: DC
  Administered 2021-04-18: 1500 mg via INTRAVENOUS
  Filled 2021-04-18 (×2): qty 300

## 2021-04-18 MED ORDER — INSULIN DETEMIR 100 UNIT/ML ~~LOC~~ SOLN
20.0000 [IU] | Freq: Every day | SUBCUTANEOUS | Status: DC
  Administered 2021-04-18 – 2021-04-19 (×2): 20 [IU] via SUBCUTANEOUS
  Filled 2021-04-18 (×3): qty 0.2

## 2021-04-18 MED ORDER — VANCOMYCIN HCL 1500 MG/300ML IV SOLN
1500.0000 mg | INTRAVENOUS | Status: DC
  Filled 2021-04-18: qty 300

## 2021-04-18 NOTE — H&P (View-Only) (Signed)
Subjective: POD #1 s/p right fifth ray resection, incision and drainage.  He is returning the operating tomorrow Dr. Evans for delayed primary closure, washout and debridement of the wound.  Patient states that overall he is feeling better.  He states that he is having prior to surgery has resolved.  He is eager to have surgery tomorrow so he can go and start his recovery and go home.  He states he does not we will change the bandage today.  Denies any fevers, chills, nausea, vomiting.   Objective: AAO x3, NAD Bandage clean, dry, intact without any strikethrough. No pain with calf compression, swelling, warmth, erythema  Assessment: POD # 2 s/p right foot fifth ray resection, I&D, debridement  Plan: WBC still elevated at 21.5 however afebrile.  Recommended dressing change today however he declined this.  Offered pain medicine that she has been using still declined.  He states it was too painful to change and the only way he wants it changed is if he is asleep. He is scheduled for surgery with Dr. Evans tomorrow for wound debridement, possible primary closure. NPO after breakfast as surgery will be after 5pm Tuesday.  We discussed the surgery including repeat debridement, washout and hopeful delayed primary closure of the wound.  We discussed the risk of surgery including, but not limited to, spread of infection, delayed or nonhealing, amputation as well as general risks of surgery occluding stroke, heart attack, death.  Agrees and wants to go and proceed with surgery tomorrow.  Surgery will not be till after 5 PM tomorrow so discussed n.p.o. after breakfast but he states that he does not want to do this and wants to stay n.p.o. all day as he has been told previously he is not allowed to eat prior.   Antibiotics per ID   Scherry Laverne, DPM 

## 2021-04-18 NOTE — Progress Notes (Signed)
Patient ID: [name], male   DOB: 1966/04/20, 55 y.o.   MRN: [id number]  PROGRESS NOTE    [name]  [name] DOB: 1966-03-11 DOA: 04/12/2021 PCP: [name], MD   Brief Narrative:   55 y.o. male with medical history significant for DM type II, HTN, PAD with history of left partial fifth metatarsal base resection July 2021 after presenting with gangrene presented with worsening right foot pain and worsening foot ulcer despite undergoing excisional debridement on 04/05/2021 by podiatry and oral doxycycline.  On presentation, he had WBCs of 26,000 with creatinine of 1.38, up from 1.02 which is his baseline.  X-ray of the right foot showed soft tissue ulceration of lateral aspect of midfoot with no evidence of underlying osteomyelitis with diffuse soft tissue edema.  He was started on broad-spectrum antibiotics.  Podiatry and vascular surgery were consulted.  Assessment & Plan:   Cellulitis of right foot/diabetic right foot ulcer infection with osteomyelitis PAD with history of left metatarsal base amputation Leukocytosis -Patient presented with worsening right foot infected ulcer despite undergoing wound debridement on 04/05/2021 and outpatient doxycycline -Currently on broad-spectrum antibiotics Rocephin, Flagyl and vancomycin.   -MRI of the right foot was consistent with osteomyelitis.  -Status post aortogram and right lower extremity angiogram done by vascular surgery on 04/14/2021.  Vascular surgery has signed off.  -No temperature spikes over the last 24 hours.  Blood cultures negative so far -Continue rosuvastatin for PAD.  Not on antiplatelets -WBCs improving.  Monitor -Status post fifth ray amputation of right foot and I&D of right foot on 04/16/2021 by podiatry.  OR cultures growing rare strep agalactiae.  Wound care as per podiatry recommendations.  Podiatry is planning for more surgical intervention probably on Tuesday evening. -will consult ID as well  Diabetes mellitus  type II with hyperglycemia -A1c 9.9.  Continue CBGs with SSI.  Increase Levemir to 20 units daily.  Hypertension -Monitor blood pressure.  Continue irbesartan/hydrochlorothiazide/nebivolol  Acute kidney injury -Presented with creatinine of 1.38: Baseline of around 1 to 2 months ago.  Treated with IV fluids.  Resolved.  Anemia -Unknown cause.  Hemoglobin stable.  Outpatient follow-up.  Hypokalemia -Improved  Thrombocytosis -Possibly reactive.  Monitor  DVT prophylaxis: SCDs Code Status: Full Family Communication: Wife at bedside on 04/14/2021 Disposition Plan: Status is: Inpatient  Remains inpatient appropriate because:Inpatient level of care appropriate due to severity of illness   Dispo: The patient is from: Home              Anticipated d/c is to: Home              Patient currently is not medically stable to d/c.   Difficult to place patient No  Consultants: Podiatry/vascular surgery  Procedures:   aortogram and right lower extremity angiogram done by vascular surgery on 04/14/2021 fifth ray amputation of right foot and I&D of right foot on 04/16/2021   Antimicrobials: Rocephin and vancomycin from 04/12/2021 onwards.  Flagyl from 04/13/2021 onwards   Subjective: Patient seen and examined at bedside.  Still complains of intermittent right foot pain.  No overnight vomiting, fever, worsening shortness of breath.  Objective: Vitals:   04/17/21 0753 04/17/21 1556 04/17/21 1936 04/18/21 0354  BP: (!) 146/83 110/61 (!) 100/56 125/66  Pulse: 68 74 70 70  Resp: 16 (!) 22 17 16   Temp: 98.4 F (36.9 C) 97.7 F (36.5 C) 98 F (36.7 C) 98 F (36.7 C)  TempSrc:   Oral Oral  SpO2: 100%  95% 98% 91%  Weight:      Height:        Intake/Output Summary (Last 24 hours) at 04/18/2021 0759 Last data filed at 04/17/2021 1900 Gross per 24 hour  Intake 720 ml  Output --  Net 720 ml   Filed Weights   04/13/21 0116 04/14/21 1640  Weight: 90.8 kg 90.8 kg    Examination:  General  exam: No acute distress.  Currently on room air.   Respiratory system: Decreased breath sounds at bases bilaterally, no wheezing cardiovascular system: Rate controlled, S1-S2 heard gastrointestinal system: Abdomen is mildly distended, soft and nontender.  Bowel sounds are heard  extremities: Mild lower extremity edema present; no cyanosis.  Right foot dressing present  Data Reviewed: I have personally reviewed following labs and imaging studies  CBC: Recent Labs  Lab 04/14/21 0547 04/15/21 0527 04/16/21 0530 04/17/21 0535 04/18/21 0528  WBC 28.8* 24.3* 24.3* 27.8* 21.5*  NEUTROABS 24.9* 20.8* 20.7* 24.5* 17.8*  HGB 9.1* 9.0* 9.0* 8.2* 9.1*  HCT 27.5* 27.0* 26.5* 25.3* 27.7*  MCV 91.7 91.8 91.4 94.8 93.9  PLT 361 417* 395 397 161*   Basic Metabolic Panel: Recent Labs  Lab 04/14/21 0547 04/15/21 0527 04/16/21 0530 04/17/21 0535 04/18/21 0528  NA 133* 137 136 135 134*  K 3.1* 3.5 3.3* 4.1 3.5  CL 101 107 107 107 105  CO2 20* 21* 21* 18* 20*  GLUCOSE 171* 130* 141* 251* 226*  BUN 42* 35* 30* 32* 40*  CREATININE 1.05 0.87 0.93 0.99 1.07  CALCIUM 8.6* 8.7* 8.5* 8.4* 8.5*  MG 2.2 2.1 1.9 2.0 2.0   GFR: Estimated Creatinine Clearance: 86.6 mL/min (by C-G formula based on SCr of 1.07 mg/dL). Liver Function Tests: Recent Labs  Lab 04/12/21 1631 04/13/21 0511  AST 12* 8*  ALT 11 9  ALKPHOS 86 75  BILITOT 3.7* 2.6*  PROT 8.0 6.6  ALBUMIN 2.7* 2.0*   No results for input(s): LIPASE, AMYLASE in the last 168 hours. No results for input(s): AMMONIA in the last 168 hours. Coagulation Profile: No results for input(s): INR, PROTIME in the last 168 hours. Cardiac Enzymes: No results for input(s): CKTOTAL, CKMB, CKMBINDEX, TROPONINI in the last 168 hours. BNP (last 3 results) No results for input(s): PROBNP in the last 8760 hours. HbA1C: No results for input(s): HGBA1C in the last 72 hours. CBG: Recent Labs  Lab 04/17/21 0752 04/17/21 1203 04/17/21 1558 04/17/21 2121  04/18/21 0726  GLUCAP 218* 216* 221* 287* 205*   Lipid Profile: No results for input(s): CHOL, HDL, LDLCALC, TRIG, CHOLHDL, LDLDIRECT in the last 72 hours. Thyroid Function Tests: No results for input(s): TSH, T4TOTAL, FREET4, T3FREE, THYROIDAB in the last 72 hours. Anemia Panel: No results for input(s): VITAMINB12, FOLATE, FERRITIN, TIBC, IRON, RETICCTPCT in the last 72 hours. Sepsis Labs: Recent Labs  Lab 04/12/21 1631 04/12/21 2339  LATICACIDVEN 1.8 1.1    Recent Results (from the past 240 hour(s))  SARS CORONAVIRUS 2 (TAT 6-24 HRS) Nasopharyngeal Nasopharyngeal Swab     Status: None   Collection Time: 04/12/21  6:30 PM   Specimen: Nasopharyngeal Swab  Result Value Ref Range Status   SARS Coronavirus 2 NEGATIVE NEGATIVE Final    Comment: (NOTE) SARS-CoV-2 target nucleic acids are NOT DETECTED.  The SARS-CoV-2 RNA is generally detectable in upper and lower respiratory specimens during the acute phase of infection. Negative results do not preclude SARS-CoV-2 infection, do not rule out co-infections with other pathogens, and should not be used as  the sole basis for treatment or other patient management decisions. Negative results must be combined with clinical observations, patient history, and epidemiological information. The expected result is Negative.  Fact Sheet for Patients: SugarRoll.be  Fact Sheet for Healthcare Providers: https://www.woods-mathews.com/  This test is not yet approved or cleared by the [place] FDA and  has been authorized for detection and/or diagnosis of SARS-CoV-2 by FDA under an Emergency Use Authorization (EUA). This EUA will remain  in effect (meaning this test can be used) for the duration of the COVID-19 declaration under Se ction 564(b)(1) of the Act, 21 U.S.C. section 360bbb-3(b)(1), unless the authorization is terminated or revoked sooner.  Performed at [facility], [place]  [address] [facility] [facility] [facility], [place] [address]   Aerobic Culture w Gram Stain (superficial specimen)     Status: None   Collection Time: 04/13/21  8:56 AM   Specimen: Wound  Result Value Ref Range Status   Specimen Description WOUND RIGHT FOOT  Final   Special Requests NONE  Final   Gram Stain   Final    FEW WBC PRESENT,BOTH PMN AND MONONUCLEAR FEW GRAM POSITIVE COCCI RARE GRAM VARIABLE ROD    Culture   Final    RARE STREPTOCOCCUS AGALACTIAE TESTING AGAINST S. AGALACTIAE NOT ROUTINELY PERFORMED DUE TO PREDICTABILITY OF AMP/PEN/VAN SUSCEPTIBILITY. WITIHIN MIXED ORGANISMS Performed at [facility], [place] [address] [place], [place] [address]    Report Status 04/17/2021 FINAL  Final  CULTURE, BLOOD (ROUTINE X 2) w Reflex to ID Panel     Status: None (Preliminary result)   Collection Time: 04/14/21  8:27 AM   Specimen: BLOOD  Result Value Ref Range Status   Specimen Description BLOOD LEFT HAND  Final   Special Requests   Final    BOTTLES DRAWN AEROBIC AND ANAEROBIC Blood Culture adequate volume   Culture   Final    NO GROWTH 4 DAYS Performed at [facility], [address] [place], [place] [address]    Report Status PENDING  Incomplete  CULTURE, BLOOD (ROUTINE X 2) w Reflex to ID Panel     Status: None (Preliminary result)   Collection Time: 04/14/21  8:28 AM   Specimen: BLOOD  Result Value Ref Range Status   Specimen Description BLOOD  RIGHT HAND  Final   Special Requests   Final    BOTTLES DRAWN AEROBIC AND ANAEROBIC Blood Culture adequate volume   Culture   Final    NO GROWTH 4 DAYS Performed at [facility], [address], [place], [place] [address]    Report Status PENDING  Incomplete  Aerobic/Anaerobic Culture w Gram Stain (surgical/deep wound)     Status: None (Preliminary result)   Collection Time: 04/16/21  1:47 PM   Specimen: PATH Other; Abscess  Result Value Ref Range Status   Specimen Description ABSCESS RIGHT FOOT  Final   Special Requests SWAB  SPEC A  Final   Gram Stain   Final    ABUNDANT WBC PRESENT, PREDOMINANTLY PMN RARE GRAM POSITIVE COCCI IN PAIRS    Culture   Final    NO GROWTH < 24 HOURS Performed at [facility], [place] [address], [place], [place] [address]    Report Status PENDING  Incomplete         Radiology Studies: No results found.      Scheduled Meds: . gentamicin ointment   Topical BID  . irbesartan  300 mg Oral Daily   And  .  hydrochlorothiazide  25 mg Oral Daily  . insulin aspart  0-15 Units Subcutaneous TID WC  . insulin aspart  0-5 Units Subcutaneous QHS  . insulin detemir  15 Units Subcutaneous Daily  . nebivolol  5 mg Oral Daily  . rosuvastatin  40 mg Oral Daily   Continuous Infusions: . cefTRIAXone (ROCEPHIN)  IV 1 g (04/18/21 0351)  . metronidazole 500 mg (04/18/21 0728)  . vancomycin            Aline August, MD [facility] 04/18/2021, 7:59 AM

## 2021-04-18 NOTE — Progress Notes (Signed)
Pharmacy Antibiotic Note  [name] is a 55 y.o. male admitted on 04/12/2021 with cellulitis.  Pharmacy has been consulted for Vancomycin dosing.  Pt is also currently on Ceftriaxone 1 gm q24h.  Scr: 1.38>>1.19>>1.05>> 0.87   Plan: Change vanc to 1250 mg IV q12h per improved renal function Goal AUC 400-550 Expected AUC: 412 SCr used: 0.87  Pharmacy will continue to follow and adjust abx dosing if warranted.  05/09 Vancomycin 05/08 1219 Vanc 1250 mg given 05/08 1447 Vpk 40 05/08 2307 Vtr 31 05/08 2327 Vanc 1250 mg given 05/09 0002 Vanc stopped, ~ 500 mg given  Ordered Vancomycin 1500 mg q24h starting 5/9 1200 Goal AUC 400-550 Expected AUC: 508   Height: 6' (182.9 cm) Weight: 90.8 kg (200 lb 2.8 oz) IBW/kg (Calculated) : 77.6  Temp (24hrs), Avg:98 F (36.7 C), Min:97.7 F (36.5 C), Max:98.4 F (36.9 C)  Recent Labs  Lab 04/12/21 1631 04/12/21 2339 04/13/21 0511 04/14/21 0547 04/15/21 0527 04/16/21 0530 04/17/21 0535 04/17/21 1447 04/17/21 2307  WBC 26.8*  --  25.6* 28.8* 24.3* 24.3* 27.8*  --   --   CREATININE 1.38*  --  1.19 1.05 0.87 0.93 0.99  --   --   LATICACIDVEN 1.8 1.1  --   --   --   --   --   --   --   VANCOTROUGH  --   --   --   --   --   --   --   --  31*  VANCOPEAK  --   --   --   --   --   --   --  40  --     Estimated Creatinine Clearance: 93.6 mL/min (by C-G formula based on SCr of 0.99 mg/dL).    No Known Allergies  Antimicrobials this admission: 5/3 Flagyl >> x 1 5/3 Cefepime >> x 1 5/4 Ceftriaxone >> 5/3 Vancomycin >>  Microbiology results: 5/4 Wound right foot pending  Thank you for allowing pharmacy to be a part of this patient's care.  Renda Rolls, PharmD, [facility] 04/18/2021 12:28 AM

## 2021-04-18 NOTE — Consult Note (Signed)
Infectious Disease     Reason for Consult: DFI    Referring Physician: Dr Starla Link Date of Admission:  04/12/2021   Principal Problem:   Cellulitis of right foot Active Problems:   Hypertension   Hyperglycemia due to type 2 diabetes mellitus ([place])   Diabetic ulcer of foot with fat layer exposed ([place])   PAD (peripheral artery disease) ([place])   AKI (acute kidney injury) ([place])   Anemia   HPI: [name] is a 55 y.o. male admitted May 3 with progressive ulcer at the base of the right foot.  He does have a history of diabetes hypertension and peripheral artery disease and is status post left partial fifth metatarsal amputation in July 2021.  He had been seen as an outpatient by podiatry and underwent excisional debridement and was prescribed doxycycline however the wound worsened.  On admission he was afebrile but his white count was 26,000.  X-ray of the foot showed soft tissue ulceration but no evidence of underlying osteo-.  He was started on cefepime vancomycin and metronidazole.  His CRP was markedly elevated at 25.  A1c was 9.9.  He underwent MRI of the foot on April May 4.  This showed concerns for proximal fifth metatarsal osteomyelitis.  He was seen by podiatry and vascular.  On May 5 he underwent angiography this was negative for any focal stenosis.  On May 7 he had amputation of the right fifth ray and incision and drainage done.  Heavy purulence was noted throughout the compartments of the foot both dorsal and plantar.  Necrotic tissue was removed.  Initial culture from May 4 showed gram-positive cocci and gram variable rods.  Growing strep agalactiae within mixed organisms.  Blood cultures were negative.  Cultures from the abscess of the right foot on May 7 showed abundant white cells and rare gram-positive cocci in pairs.  He has been on ceftriaxone-changed from cefepime on May 3, metronidazole and vancomycin. Today he complains of continued pain in foot. He states yest had severe pain with  dressing changes. No fevers.   Past Medical History:  Diagnosis Date  . Diabetes mellitus without complication ([place])   . Hyperlipidemia   . Hypertension    Past Surgical History:  Procedure Laterality Date  . AMPUTATION Right 04/16/2021   Procedure: AMPUTATION  RIGHT 5TH RAY, INCISION AND DRAINAGE;  Surgeon: Edrick Kins, DPM;  Location: [facility];  Service: Podiatry;  Laterality: Right;  . BONE BIOPSY Left 06/27/2020   Procedure: BONE BIOPSY;  Surgeon: Caroline More, DPM;  Location: [facility] OR[facility];  Service: Podiatry;  Laterality: Left;  . INCISION AND DRAINAGE Left 06/27/2020   Procedure: INCISION AND DRAINAGE;  Surgeon: Caroline More, DPM;  Location: [facility];  Service: Podiatry;  Laterality: Left;  . LOWER EXTREMITY ANGIOGRAPHY Left 06/30/2020   Procedure: Lower Extremity Angiography;  Surgeon: Algernon Huxley, MD;  Location: [place] [facility];  Service: Cardiovascular;  Laterality: Left;  . LOWER EXTREMITY ANGIOGRAPHY Right 04/14/2021   Procedure: Lower Extremity Angiography;  Surgeon: Algernon Huxley, MD;  Location: [facility];  Service: Cardiovascular;  Laterality: Right;  . NO PAST SURGERIES     Social History   Tobacco Use  . Smoking status: Never Smoker  . Smokeless tobacco: Never Used  Vaping Use  . Vaping Use: Never used  Substance Use Topics  . Alcohol use: Yes    Alcohol/week: 6.0 - 12.0 standard drinks    Types: 6 - 12 Cans of beer per  week    Comment: Occasional  . Drug use: No   Family History  Problem Relation Age of Onset  . Heart disease Father   . Heart attack Father   . Healthy Mother   . Prostate cancer Neg Hx   . Kidney cancer Neg Hx   . Bladder Cancer Neg Hx     Allergies: No Known Allergies  Current antibiotics: Antibiotics Given (last 72 hours)    Date/Time Action Medication Dose Rate   04/15/21 1412 New Bag/Given   vancomycin (VANCOREADY) IVPB 1250 mg/250 mL 1,250 mg 166.7 mL/hr   04/15/21 2124 Given   metroNIDAZOLE (FLAGYL) tablet 500 mg  500 mg    04/16/21 0043 New Bag/Given   vancomycin (VANCOREADY) IVPB 1250 mg/250 mL 1,250 mg 166.7 mL/hr   04/16/21 0407 New Bag/Given   cefTRIAXone (ROCEPHIN) 1 g in sodium chloride 0.9 % 100 mL IVPB 1 g 200 mL/hr   04/16/21 0643 New Bag/Given   metroNIDAZOLE (FLAGYL) IVPB 500 mg 500 mg 100 mL/hr   04/16/21 1556 New Bag/Given  [pt off unit in OR]   vancomycin (VANCOREADY) IVPB 1250 mg/250 mL 1,250 mg 166.7 mL/hr   04/16/21 1728 New Bag/Given   metroNIDAZOLE (FLAGYL) IVPB 500 mg 500 mg 100 mL/hr   04/16/21 2200 New Bag/Given   metroNIDAZOLE (FLAGYL) IVPB 500 mg 500 mg 100 mL/hr   04/16/21 2316 New Bag/Given   vancomycin (VANCOREADY) IVPB 1250 mg/250 mL 1,250 mg 166.7 mL/hr   04/17/21 0357 New Bag/Given   cefTRIAXone (ROCEPHIN) 1 g in sodium chloride 0.9 % 100 mL IVPB 1 g 200 mL/hr   04/17/21 0093 New Bag/Given   metroNIDAZOLE (FLAGYL) IVPB 500 mg 500 mg 100 mL/hr   04/17/21 1219 New Bag/Given   vancomycin (VANCOREADY) IVPB 1250 mg/250 mL 1,250 mg 166.7 mL/hr   04/17/21 1453 New Bag/Given   metroNIDAZOLE (FLAGYL) IVPB 500 mg 500 mg 100 mL/hr   04/17/21 2211 New Bag/Given   metroNIDAZOLE (FLAGYL) IVPB 500 mg 500 mg 100 mL/hr   04/17/21 2327 New Bag/Given   vancomycin (VANCOREADY) IVPB 1250 mg/250 mL 1,250 mg 166.7 mL/hr   04/18/21 0351 New Bag/Given   cefTRIAXone (ROCEPHIN) 1 g in sodium chloride 0.9 % 100 mL IVPB 1 g 200 mL/hr   04/18/21 8182 New Bag/Given   metroNIDAZOLE (FLAGYL) IVPB 500 mg 500 mg 100 mL/hr      MEDICATIONS: . gentamicin ointment   Topical BID  . irbesartan  300 mg Oral Daily   And  . hydrochlorothiazide  25 mg Oral Daily  . insulin aspart  0-15 Units Subcutaneous TID WC  . insulin aspart  0-5 Units Subcutaneous QHS  . insulin detemir  20 Units Subcutaneous Daily  . nebivolol  5 mg Oral Daily  . rosuvastatin  40 mg Oral Daily    Review of Systems - 11 systems reviewed and negative per HPI   OBJECTIVE: Temp:  [97.7 F (36.5 C)-98.7 F (37.1 C)]  98.7 F (37.1 C) (05/09 0844) Pulse Rate:  [68-74] 68 (05/09 0844) Resp:  [16-22] 17 (05/09 0844) BP: (100-125)/(56-66) 116/62 (05/09 0844) SpO2:  [91 %-98 %] 97 % (05/09 0844)  Physical Exam  Constitutional: He is oriented to person, place, and time. He appears well-developed and well-nourished. No distress.  HENT:  Mouth/Throat: Oropharynx is clear and moist. No oropharyngeal exudate.  Cardiovascular: Normal rate, regular rhythm and normal heart sounds. Exam reveals no gallop and no friction rub.  No murmur heard.  Pulmonary/Chest: Effort normal and breath sounds  normal. No respiratory distress. He has no wheezes.  Abdominal: Soft. Bowel sounds are normal. He exhibits no distension. There is no tenderness.  Lymphadenopathy:  He has no cervical adenopathy.  Neurological: He is alert and oriented to person, place, and time.  Skin: Foot wrapped and pt refuses exam due to pain Psychiatric: He has a normal mood and affect. His behavior is normal.         LABS: Results for orders placed or performed during the hospital encounter of 04/12/21 (from the past 48 hour(s))  Glucose, capillary     Status: Abnormal   Collection Time: 04/16/21  2:38 PM  Result Value Ref Range   Glucose-Capillary 131 (H) 70 - 99 mg/dL    Comment: Glucose reference range applies only to samples taken after fasting for at least 8 hours.  Glucose, capillary     Status: Abnormal   Collection Time: 04/16/21  4:12 PM  Result Value Ref Range   Glucose-Capillary 155 (H) 70 - 99 mg/dL    Comment: Glucose reference range applies only to samples taken after fasting for at least 8 hours.  Glucose, capillary     Status: Abnormal   Collection Time: 04/16/21  9:05 PM  Result Value Ref Range   Glucose-Capillary 276 (H) 70 - 99 mg/dL    Comment: Glucose reference range applies only to samples taken after fasting for at least 8 hours.   Comment 1 Notify RN    Comment 2 Document in Chart   CBC with Differential/Platelet      Status: Abnormal   Collection Time: 04/17/21  5:35 AM  Result Value Ref Range   WBC 27.8 (H) 4.0 - 10.5 K/uL   RBC 2.67 (L) 4.22 - 5.81 MIL/uL   Hemoglobin 8.2 (L) 13.0 - 17.0 g/dL   HCT 25.3 (L) 39.0 - 52.0 %   MCV 94.8 80.0 - 100.0 fL   MCH 30.7 26.0 - 34.0 pg   MCHC 32.4 30.0 - 36.0 g/dL   RDW 12.1 11.5 - 15.5 %   Platelets 397 150 - 400 K/uL   nRBC 0.0 0.0 - 0.2 %   Neutrophils Relative % 88 %   Neutro Abs 24.5 (H) 1.7 - 7.7 K/uL   Lymphocytes Relative 3 %   Lymphs Abs 0.8 0.7 - 4.0 K/uL   Monocytes Relative 6 %   Monocytes Absolute 1.8 (H) 0.1 - 1.0 K/uL   Eosinophils Relative 0 %   Eosinophils Absolute 0.0 0.0 - 0.5 K/uL   Basophils Relative 0 %   Basophils Absolute 0.1 0.0 - 0.1 K/uL   WBC Morphology MORPHOLOGY UNREMARKABLE    RBC Morphology MORPHOLOGY UNREMARKABLE    Smear Review Reviewed    Immature Granulocytes 3 %   Abs Immature Granulocytes 0.75 (H) 0.00 - 0.07 K/uL    Comment: Performed at [facility], [address] [place], [place] [address]  Basic metabolic panel     Status: Abnormal   Collection Time: 04/17/21  5:35 AM  Result Value Ref Range   Sodium 135 135 - 145 mmol/L   Potassium 4.1 3.5 - 5.1 mmol/L   Chloride 107 98 - 111 mmol/L   CO2 18 (L) 22 - 32 mmol/L   Glucose, Bld 251 (H) 70 - 99 mg/dL    Comment: Glucose reference range applies only to samples taken after fasting for at least 8 hours.   BUN 32 (H) 6 - 20 mg/dL   Creatinine, Ser 0.99 0.61 - 1.24 mg/dL  Calcium 8.4 (L) 8.9 - 10.3 mg/dL   GFR, Estimated >60 >60 mL/min    Comment: (NOTE) Calculated using the CKD-EPI Creatinine Equation (2021)    Anion gap 10 5 - 15    Comment: Performed at [facility], [address] [place], [place] [address]  Magnesium     Status: None   Collection Time: 04/17/21  5:35 AM  Result Value Ref Range   Magnesium 2.0 1.7 - 2.4 mg/dL    Comment: Performed at [facility], [address] [place], [place] [address]   C-reactive protein     Status: Abnormal   Collection Time: 04/17/21  5:35 AM  Result Value Ref Range   CRP 20.8 (H) <1.0 mg/dL    Comment: Performed at [facility], [place] [address] [place], [place] [address]  Glucose, capillary     Status: Abnormal   Collection Time: 04/17/21  7:52 AM  Result Value Ref Range   Glucose-Capillary 218 (H) 70 - 99 mg/dL    Comment: Glucose reference range applies only to samples taken after fasting for at least 8 hours.  Glucose, capillary     Status: Abnormal   Collection Time: 04/17/21 12:03 PM  Result Value Ref Range   Glucose-Capillary 216 (H) 70 - 99 mg/dL    Comment: Glucose reference range applies only to samples taken after fasting for at least 8 hours.  Vancomycin, peak     Status: None   Collection Time: 04/17/21  2:47 PM  Result Value Ref Range   Vancomycin Pk 40 30 - 40 ug/mL    Comment: Performed at [facility], [address] [place], [place] [address]  Glucose, capillary     Status: Abnormal   Collection Time: 04/17/21  3:58 PM  Result Value Ref Range   Glucose-Capillary 221 (H) 70 - 99 mg/dL    Comment: Glucose reference range applies only to samples taken after fasting for at least 8 hours.  Glucose, capillary     Status: Abnormal   Collection Time: 04/17/21  9:21 PM  Result Value Ref Range   Glucose-Capillary 287 (H) 70 - 99 mg/dL    Comment: Glucose reference range applies only to samples taken after fasting for at least 8 hours.  Vancomycin, trough     Status: Abnormal   Collection Time: 04/17/21 11:07 PM  Result Value Ref Range   Vancomycin Tr 31 (HH) 15 - 20 ug/mL    Comment: CRITICAL RESULT CALLED TO, READ BACK BY AND VERIFIED WITH NATHAN BELUE AT 2354 04/17/21 MF Performed at [facility], [address], [place], [place] [address]   Basic metabolic panel     Status: Abnormal   Collection Time: 04/18/21  5:28 AM  Result Value Ref Range   Sodium 134 (L) 135 - 145 mmol/L   Potassium 3.5 3.5 -  5.1 mmol/L   Chloride 105 98 - 111 mmol/L   CO2 20 (L) 22 - 32 mmol/L   Glucose, Bld 226 (H) 70 - 99 mg/dL    Comment: Glucose reference range applies only to samples taken after fasting for at least 8 hours.   BUN 40 (H) 6 - 20 mg/dL   Creatinine, Ser 1.07 0.61 - 1.24 mg/dL   Calcium 8.5 (L) 8.9 - 10.3 mg/dL   GFR, Estimated >60 >60 mL/min    Comment: (NOTE) Calculated using the CKD-EPI Creatinine Equation (2021)    Anion gap 9 5 - 15    Comment: Performed  at [facility], [address] [place], [place] [address]  CBC with Differential/Platelet     Status: Abnormal   Collection Time: 04/18/21  5:28 AM  Result Value Ref Range   WBC 21.5 (H) 4.0 - 10.5 K/uL   RBC 2.95 (L) 4.22 - 5.81 MIL/uL   Hemoglobin 9.1 (L) 13.0 - 17.0 g/dL   HCT 27.7 (L) 39.0 - 52.0 %   MCV 93.9 80.0 - 100.0 fL   MCH 30.8 26.0 - 34.0 pg   MCHC 32.9 30.0 - 36.0 g/dL   RDW 12.2 11.5 - 15.5 %   Platelets 538 (H) 150 - 400 K/uL   nRBC 0.1 0.0 - 0.2 %   Neutrophils Relative % 83 %   Neutro Abs 17.8 (H) 1.7 - 7.7 K/uL   Lymphocytes Relative 6 %   Lymphs Abs 1.3 0.7 - 4.0 K/uL   Monocytes Relative 7 %   Monocytes Absolute 1.5 (H) 0.1 - 1.0 K/uL   Eosinophils Relative 0 %   Eosinophils Absolute 0.0 0.0 - 0.5 K/uL   Basophils Relative 0 %   Basophils Absolute 0.1 0.0 - 0.1 K/uL   Immature Granulocytes 4 %   Abs Immature Granulocytes 0.76 (H) 0.00 - 0.07 K/uL    Comment: Performed at [facility], [address] [place], [place] [address]  Magnesium     Status: None   Collection Time: 04/18/21  5:28 AM  Result Value Ref Range   Magnesium 2.0 1.7 - 2.4 mg/dL    Comment: Performed at [facility], [place], [place] [address]  C-reactive protein     Status: Abnormal   Collection Time: 04/18/21  5:28 AM  Result Value Ref Range   CRP 14.8 (H) <1.0 mg/dL    Comment: Performed at [facility], [address] [place], [place] [address]  Glucose, capillary      Status: Abnormal   Collection Time: 04/18/21  7:26 AM  Result Value Ref Range   Glucose-Capillary 205 (H) 70 - 99 mg/dL    Comment: Glucose reference range applies only to samples taken after fasting for at least 8 hours.  Glucose, capillary     Status: Abnormal   Collection Time: 04/18/21 12:53 PM  Result Value Ref Range   Glucose-Capillary 239 (H) 70 - 99 mg/dL    Comment: Glucose reference range applies only to samples taken after fasting for at least 8 hours.   No components found for: ESR, C REACTIVE PROTEIN MICRO: Recent Results (from the past 720 hour(s))  SARS CORONAVIRUS 2 (TAT 6-24 HRS) Nasopharyngeal Nasopharyngeal Swab     Status: None   Collection Time: 04/12/21  6:30 PM   Specimen: Nasopharyngeal Swab  Result Value Ref Range Status   SARS Coronavirus 2 NEGATIVE NEGATIVE Final    Comment: (NOTE) SARS-CoV-2 target nucleic acids are NOT DETECTED.  The SARS-CoV-2 RNA is generally detectable in upper and lower respiratory specimens during the acute phase of infection. Negative results do not preclude SARS-CoV-2 infection, do not rule out co-infections with other pathogens, and should not be used as the sole basis for treatment or other patient management decisions. Negative results must be combined with clinical observations, patient history, and epidemiological information. The expected result is Negative.  Fact Sheet for Patients: SugarRoll.be  Fact Sheet for Healthcare Providers: https://www.woods-mathews.com/  This test is not yet approved or cleared by the [place] FDA and  has been authorized for detection and/or diagnosis of SARS-CoV-2  by FDA under an Emergency Use Authorization (EUA). This EUA will remain  in effect (meaning this test can be used) for the duration of the COVID-19 declaration under Se ction 564(b)(1) of the Act, 21 U.S.C. section 360bbb-3(b)(1), unless the authorization is terminated or revoked  sooner.  Performed at [facility], [place] [address] [place], [place] [address]   Aerobic Culture w Gram Stain (superficial specimen)     Status: None   Collection Time: 04/13/21  8:56 AM   Specimen: Wound  Result Value Ref Range Status   Specimen Description WOUND RIGHT FOOT  Final   Special Requests NONE  Final   Gram Stain   Final    FEW WBC PRESENT,BOTH PMN AND MONONUCLEAR FEW GRAM POSITIVE COCCI RARE GRAM VARIABLE ROD    Culture   Final    RARE STREPTOCOCCUS AGALACTIAE TESTING AGAINST S. AGALACTIAE NOT ROUTINELY PERFORMED DUE TO PREDICTABILITY OF AMP/PEN/VAN SUSCEPTIBILITY. WITIHIN MIXED ORGANISMS Performed at [facility], [place] [address] [place], [place] [address]    Report Status 04/17/2021 FINAL  Final  CULTURE, BLOOD (ROUTINE X 2) w Reflex to ID Panel     Status: None (Preliminary result)   Collection Time: 04/14/21  8:27 AM   Specimen: BLOOD  Result Value Ref Range Status   Specimen Description BLOOD LEFT HAND  Final   Special Requests   Final    BOTTLES DRAWN AEROBIC AND ANAEROBIC Blood Culture adequate volume   Culture   Final    NO GROWTH 4 DAYS Performed at [facility], [address], [place], [place] [address]    Report Status PENDING  Incomplete  CULTURE, BLOOD (ROUTINE X 2) w Reflex to ID Panel     Status: None (Preliminary result)   Collection Time: 04/14/21  8:28 AM   Specimen: BLOOD  Result Value Ref Range Status   Specimen Description BLOOD  RIGHT HAND  Final   Special Requests   Final    BOTTLES DRAWN AEROBIC AND ANAEROBIC Blood Culture adequate volume   Culture   Final    NO GROWTH 4 DAYS Performed at [facility], [address] [place], [place] [address]    Report Status PENDING  Incomplete  Aerobic/Anaerobic Culture w Gram Stain (surgical/deep wound)     Status: None (Preliminary result)   Collection Time: 04/16/21  1:47 PM   Specimen: PATH Other; Abscess  Result Value Ref Range Status   Specimen  Description ABSCESS RIGHT FOOT  Final   Special Requests SWAB SPEC A  Final   Gram Stain   Final    ABUNDANT WBC PRESENT, PREDOMINANTLY PMN RARE GRAM POSITIVE COCCI IN PAIRS    Culture   Final    NO GROWTH 2 DAYS Performed at [facility], [place] [address], [place], [place] [address]    Report Status PENDING  Incomplete    IMAGING: MR FOOT RIGHT W WO CONTRAST  Result Date: 04/13/2021 CLINICAL DATA:  Diabetic patient with a skin ulceration on the lateral aspect of the right foot. Elevated white blood cell count. EXAM: MRI OF THE RIGHT FOREFOOT WITHOUT AND WITH CONTRAST TECHNIQUE: Multiplanar, multisequence MR imaging of the right forefoot was performed before and after the administration of intravenous contrast. CONTRAST:  9 mL GADAVIST IV SOLN COMPARISON:  Plain films right foot 04/12/2021. FINDINGS: Bones/Joint/Cartilage There is mild marrow edema and enhancement the lateral aspect of the proximal 2 cm of the fifth metatarsal consistent with osteomyelitis. Signal change and  enhancement are subjacent to a skin ulceration. Bone marrow signal is otherwise normal. No joint effusion is identified. Ligaments Intact. Muscles and Tendons Intact. No intramuscular fluid collection. Intermediate increased T2 signal in intrinsic musculature of the foot is most consistent with diabetic myopathy. Soft tissues Skin ulceration is seen at the base of the fifth metatarsal. No underlying abscess. There is a small amount of gas in soft tissues subjacent to the wound. Subcutaneous edema and enhancement about the foot are consistent with cellulitis. IMPRESSION: Skin ulceration just peripheral to the base of the fifth metatarsal with signal change and enhancement in the proximal 2 cm of the fifth metatarsal consistent with osteomyelitis. Negative for abscess or septic joint. Subcutaneous edema and enhancement about the foot most consistent with cellulitis. Electronically Signed   By: Inge Rise M.D.   On:  04/13/2021 16:12   PERIPHERAL VASCULAR CATHETERIZATION  Result Date: 04/14/2021 See op note  US ARTERIAL LOWER EXTREMITY DUPLEX RIGHT(NON-ABI)  Result Date: 04/14/2021 CLINICAL DATA:  55 year old with right foot ulcer. EXAM: RIGHT LOWER EXTREMITY ARTERIAL DUPLEX SCAN TECHNIQUE: Gray-scale sonography as well as color Doppler and duplex ultrasound was performed to evaluate the lower extremity arteries including the common, superficial and profunda femoral arteries, popliteal artery and calf arteries. COMPARISON:  None. FINDINGS: Right/Left Lower Extremity ABI: Not obtained Inflow: Normal common femoral arterial waveforms and velocities. No evidence of inflow (aortoiliac) disease. Outflow: Normal profunda femoral, superficial femoral and popliteal arterial waveforms and velocities. No focal elevation of the PSV to suggest stenosis. Runoff: Normal posterior tibial, peroneal and anterior tibial arterial waveforms and velocities. IMPRESSION: Normal right lower extremity arterial duplex. Electronically Signed   By: Markus Daft M.D.   On: 04/14/2021 09:34   DG Foot Complete Right  Result Date: 04/12/2021 CLINICAL DATA:  Right foot pain, infection EXAM: RIGHT FOOT COMPLETE - 3+ VIEW COMPARISON:  04/05/2021 FINDINGS: Frontal, oblique, and lateral views of the right foot are obtained. No fracture, subluxation, or dislocation. Ulceration within the lateral midfoot overlying the base of the fifth metatarsal. Minimal subcutaneous gas. No underlying bony destruction or periosteal reaction to suggest osteomyelitis. There is diffuse soft tissue swelling throughout the remainder of the forefoot and midfoot. IMPRESSION: 1. Soft tissue ulceration lateral aspect midfoot, with no evidence of underlying osteomyelitis. 2. Diffuse soft tissue edema consistent with cellulitis. Electronically Signed   By: Randa Ngo M.D.   On: 04/12/2021 18:48   DG Foot Complete Right  Result Date: 04/05/2021 Please see detailed radiograph  report in office note.   Assessment:   [name] is a 55 y.o. male with diabetic foot osteomyelitis of the right foot in a patient with poorly controlled diabetes with an A1c of 9.9.  He has undergone MRI which showed osteomyelitis of the right fifth metatarsal.  He is now status post amputation of the right fifth metatarsal with extensive findings of purulence.  He would likely need repeat surgery.  He has had an angiogram which was negative for any stenosis.  He has been on initially cefepime and metronidazole and vancomycin.  The cefepime was recently changed to ceftriaxone.  His white count remains elevated at 21.  He did initially have fevers but those seem to be improving. Cxs mixed but with strep agalactae.  Of note he was on doxycycline at the time of admission so cultures may be affected.  Recommendations Repeat debridement tomorrow. Path is pending Given extent of infection will likely need IV abx initially for 2-6 weeks with final duration  based on path results, findings at surgery tomorrow and clinical response.  Thank you very much for allowing me to participate in the care of this patient. Please call with questions.   Cheral Marker. Ola Spurr, MD

## 2021-04-18 NOTE — Progress Notes (Signed)
Subjective: POD #1 s/p right fifth ray resection, incision and drainage.  He is returning the operating tomorrow Dr. Amalia Hailey for delayed primary closure, washout and debridement of the wound.  Patient states that overall he is feeling better.  He states that he is having prior to surgery has resolved.  He is eager to have surgery tomorrow so he can go and start his recovery and go home.  He states he does not we will change the bandage today.  Denies any fevers, chills, nausea, vomiting.   Objective: AAO x3, NAD Bandage clean, dry, intact without any strikethrough. No pain with calf compression, swelling, warmth, erythema  Assessment: POD # 2 s/p right foot fifth ray resection, I&D, debridement  Plan: WBC still elevated at 21.5 however afebrile.  Recommended dressing change today however he declined this.  Offered pain medicine that she has been using still declined.  He states it was too painful to change and the only way he wants it changed is if he is asleep. He is scheduled for surgery with Dr. Amalia Hailey tomorrow for wound debridement, possible primary closure. NPO after breakfast as surgery will be after 5pm Tuesday.  We discussed the surgery including repeat debridement, washout and hopeful delayed primary closure of the wound.  We discussed the risk of surgery including, but not limited to, spread of infection, delayed or nonhealing, amputation as well as general risks of surgery occluding stroke, heart attack, death.  Agrees and wants to go and proceed with surgery tomorrow.  Surgery will not be till after 5 PM tomorrow so discussed n.p.o. after breakfast but he states that he does not want to do this and wants to stay n.p.o. all day as he has been told previously he is not allowed to eat prior.   Antibiotics per ID   [name], DPM

## 2021-04-18 NOTE — Progress Notes (Signed)
Inpatient Diabetes Program Recommendations  AACE/ADA: New Consensus Statement on Inpatient Glycemic Control   Target Ranges:  Prepandial:   less than 140 mg/dL      Peak postprandial:   less than 180 mg/dL (1-2 hours)      Critically ill patients:  140 - 180 mg/dL   Results for [name], [name] (MRN [id number]) as of 04/18/2021 11:57  Ref. Range 04/17/2021 07:52 04/17/2021 12:03 04/17/2021 15:58 04/17/2021 21:21 04/18/2021 07:26  Glucose-Capillary Latest Ref Range: 70 - 99 mg/dL 218 (H) 216 (H) 221 (H) 287 (H) 205 (H)   Review of Glycemic Control  Current orders for Inpatient glycemic control: Levemir 20 units daily, Novolog 0-15 units TID with meals, Novolog 0-5 units QHS  Inpatient Diabetes Program Recommendations:    Insulin: Noted Levemir increased from 15 to 20 units daily today. Please consider ordering Novolog 4 units TID with meals for meal coverage if patient eats at least 50% of meals..  Thanks, Barnie Alderman, RN, MSN, CDE Diabetes Coordinator Inpatient Diabetes Program [phone] (Team Pager from 8am to 5pm)

## 2021-04-18 NOTE — Plan of Care (Signed)

## 2021-04-19 ENCOUNTER — Telehealth: Payer: Self-pay

## 2021-04-19 ENCOUNTER — Ambulatory Visit: Payer: Managed Care, Other (non HMO) | Admitting: Podiatry

## 2021-04-19 ENCOUNTER — Inpatient Hospital Stay: Payer: Managed Care, Other (non HMO)

## 2021-04-19 ENCOUNTER — Encounter
Admission: EM | Disposition: A | Discharge status: Home Health | Payer: Self-pay | Source: Home / Self Care | Attending: Internal Medicine

## 2021-04-19 ENCOUNTER — Encounter: Payer: Self-pay | Admitting: Internal Medicine

## 2021-04-19 DIAGNOSIS — I96 Gangrene, not elsewhere classified: Secondary | ICD-10-CM

## 2021-04-19 DIAGNOSIS — L03115 Cellulitis of right lower limb: Secondary | ICD-10-CM

## 2021-04-19 HISTORY — PX: WOUND DEBRIDEMENT: SHX247

## 2021-04-19 LAB — BASIC METABOLIC PANEL
Anion gap: 6 (ref 5–15)
BUN: 29 mg/dL — ABNORMAL HIGH (ref 6–20)
CO2: 21 mmol/L — ABNORMAL LOW (ref 22–32)
Calcium: 8.4 mg/dL — ABNORMAL LOW (ref 8.9–10.3)
Chloride: 108 mmol/L (ref 98–111)
Creatinine, Ser: 0.91 mg/dL (ref 0.61–1.24)
GFR, Estimated: >60 mL/min (ref >60)
Glucose, Bld: 136 mg/dL — ABNORMAL HIGH (ref 70–99)
Potassium: 3.2 mmol/L — ABNORMAL LOW (ref 3.5–5.1)
Sodium: 135 mmol/L (ref 135–145)

## 2021-04-19 LAB — CBC WITH DIFFERENTIAL/PLATELET
Abs Immature Granulocytes: 0.49 10*3/uL — ABNORMAL HIGH (ref 0.00–0.07)
Basophils Absolute: 0 10*3/uL (ref 0.0–0.1)
Basophils Relative: 0 %
Eosinophils Absolute: 0 10*3/uL (ref 0.0–0.5)
Eosinophils Relative: 0 %
HCT: 22.4 % — ABNORMAL LOW (ref 39.0–52.0)
Hemoglobin: 7.5 g/dL — ABNORMAL LOW (ref 13.0–17.0)
Immature Granulocytes: 3 %
Lymphocytes Relative: 8 %
Lymphs Abs: 1.4 10*3/uL (ref 0.7–4.0)
MCH: 30.6 pg (ref 26.0–34.0)
MCHC: 33.5 g/dL (ref 30.0–36.0)
MCV: 91.4 fL (ref 80.0–100.0)
Monocytes Absolute: 1.7 10*3/uL — ABNORMAL HIGH (ref 0.1–1.0)
Monocytes Relative: 9 %
Neutro Abs: 14.2 10*3/uL — ABNORMAL HIGH (ref 1.7–7.7)
Neutrophils Relative %: 80 %
Platelets: 471 10*3/uL — ABNORMAL HIGH (ref 150–400)
RBC: 2.45 MIL/uL — ABNORMAL LOW (ref 4.22–5.81)
RDW: 12.4 % (ref 11.5–15.5)
WBC: 17.9 10*3/uL — ABNORMAL HIGH (ref 4.0–10.5)
nRBC: 0 % (ref 0.0–0.2)

## 2021-04-19 LAB — GLUCOSE, CAPILLARY
Glucose-Capillary: 112 mg/dL — ABNORMAL HIGH (ref 70–99)
Glucose-Capillary: 126 mg/dL — ABNORMAL HIGH (ref 70–99)
Glucose-Capillary: 123 mg/dL — ABNORMAL HIGH (ref 70–99)
Glucose-Capillary: 183 mg/dL — ABNORMAL HIGH (ref 70–99)
Glucose-Capillary: 298 mg/dL — ABNORMAL HIGH (ref 70–99)

## 2021-04-19 LAB — C-REACTIVE PROTEIN: CRP: 12 mg/dL — ABNORMAL HIGH (ref <1.0)

## 2021-04-19 LAB — MAGNESIUM: Magnesium: 1.8 mg/dL (ref 1.7–2.4)

## 2021-04-19 SURGERY — DEBRIDEMENT, WOUND
Anesthesia: General | Laterality: Right

## 2021-04-19 MED ORDER — CHLORHEXIDINE GLUCONATE 0.12 % MT SOLN
OROMUCOSAL | Status: AC
  Administered 2021-04-19: 15 mL via OROMUCOSAL
  Filled 2021-04-19: qty 15

## 2021-04-19 MED ORDER — CHLORHEXIDINE GLUCONATE 0.12 % MT SOLN: 15.0000 mL | Freq: Once | OROMUCOSAL | Status: AC

## 2021-04-19 MED ORDER — BUPIVACAINE HCL 0.5 % IJ SOLN
INTRAMUSCULAR | Status: DC | PRN
  Administered 2021-04-19: 10 mL

## 2021-04-19 MED ORDER — ONDANSETRON HCL 4 MG/2ML IJ SOLN
INTRAMUSCULAR | Status: DC | PRN
  Administered 2021-04-19: 4 mg via INTRAVENOUS

## 2021-04-19 MED ORDER — MIDAZOLAM HCL 2 MG/2ML IJ SOLN
INTRAMUSCULAR | Status: AC
  Filled 2021-04-19: qty 2

## 2021-04-19 MED ORDER — DEXAMETHASONE SODIUM PHOSPHATE 10 MG/ML IJ SOLN
INTRAMUSCULAR | Status: AC
  Filled 2021-04-19: qty 1

## 2021-04-19 MED ORDER — PHENYLEPHRINE HCL (PRESSORS) 10 MG/ML IV SOLN
INTRAVENOUS | Status: DC | PRN
  Administered 2021-04-19 (×2): 100 ug via INTRAVENOUS

## 2021-04-19 MED ORDER — FENTANYL CITRATE (PF) 100 MCG/2ML IJ SOLN
INTRAMUSCULAR | Status: AC
  Filled 2021-04-19: qty 2

## 2021-04-19 MED ORDER — DEXMEDETOMIDINE (PRECEDEX) IN NS 20 MCG/5ML (4 MCG/ML) IV SYRINGE
PREFILLED_SYRINGE | INTRAVENOUS | Status: DC | PRN
  Administered 2021-04-19: 8 ug via INTRAVENOUS

## 2021-04-19 MED ORDER — EPHEDRINE SULFATE 50 MG/ML IJ SOLN
INTRAMUSCULAR | Status: DC | PRN
  Administered 2021-04-19 (×3): 5 mg via INTRAVENOUS

## 2021-04-19 MED ORDER — MIDAZOLAM HCL 2 MG/2ML IJ SOLN
INTRAMUSCULAR | Status: DC | PRN
  Administered 2021-04-19: 2 mg via INTRAVENOUS

## 2021-04-19 MED ORDER — DEXAMETHASONE SODIUM PHOSPHATE 10 MG/ML IJ SOLN
INTRAMUSCULAR | Status: DC | PRN
  Administered 2021-04-19: 4 mg via INTRAVENOUS

## 2021-04-19 MED ORDER — ACETAMINOPHEN 10 MG/ML IV SOLN
INTRAVENOUS | Status: AC
  Filled 2021-04-19: qty 100

## 2021-04-19 MED ORDER — LIDOCAINE HCL 1 % IJ SOLN
INTRAMUSCULAR | Status: DC | PRN
  Administered 2021-04-19: 10 mL

## 2021-04-19 MED ORDER — FENTANYL CITRATE (PF) 100 MCG/2ML IJ SOLN
INTRAMUSCULAR | Status: DC | PRN
  Administered 2021-04-19 (×4): 25 ug via INTRAVENOUS

## 2021-04-19 MED ORDER — SODIUM CHLORIDE 0.9 % IV SOLN: INTRAVENOUS | Status: DC

## 2021-04-19 MED ORDER — CHLORHEXIDINE GLUCONATE CLOTH 2 % EX PADS: 6.0000 | MEDICATED_PAD | Freq: Once | CUTANEOUS | Status: DC

## 2021-04-19 MED ORDER — POTASSIUM CHLORIDE CRYS ER 20 MEQ PO TBCR
40.0000 meq | EXTENDED_RELEASE_TABLET | ORAL | Status: AC
  Administered 2021-04-19: 40 meq via ORAL
  Filled 2021-04-19: qty 2

## 2021-04-19 MED ORDER — LIDOCAINE HCL (PF) 2 % IJ SOLN
INTRAMUSCULAR | Status: AC
  Filled 2021-04-19: qty 5

## 2021-04-19 MED ORDER — LIDOCAINE HCL (CARDIAC) PF 100 MG/5ML IV SOSY
PREFILLED_SYRINGE | INTRAVENOUS | Status: DC | PRN
  Administered 2021-04-19: 100 mg via INTRAVENOUS

## 2021-04-19 MED ORDER — ONDANSETRON HCL 4 MG/2ML IJ SOLN
INTRAMUSCULAR | Status: AC
  Filled 2021-04-19: qty 2

## 2021-04-19 MED ORDER — ONDANSETRON HCL 4 MG/2ML IJ SOLN: 4.0000 mg | Freq: Once | INTRAMUSCULAR | Status: DC | PRN

## 2021-04-19 MED ORDER — BUPIVACAINE HCL (PF) 0.5 % IJ SOLN
INTRAMUSCULAR | Status: AC
  Filled 2021-04-19: qty 30

## 2021-04-19 MED ORDER — INSULIN ASPART 100 UNIT/ML IJ SOLN
0.0000 [IU] | Freq: Three times a day (TID) | INTRAMUSCULAR | Status: DC
  Administered 2021-04-19: 3 [IU] via SUBCUTANEOUS
  Administered 2021-04-20: 2 [IU] via SUBCUTANEOUS
  Administered 2021-04-20: 5 [IU] via SUBCUTANEOUS
  Administered 2021-04-20: 2 [IU] via SUBCUTANEOUS
  Administered 2021-04-22: 5 [IU] via SUBCUTANEOUS
  Administered 2021-04-22: 8 [IU] via SUBCUTANEOUS
  Filled 2021-04-19 (×6): qty 1

## 2021-04-19 MED ORDER — ACETAMINOPHEN 10 MG/ML IV SOLN
INTRAVENOUS | Status: DC | PRN
  Administered 2021-04-19: 1000 mg via INTRAVENOUS

## 2021-04-19 MED ORDER — LIDOCAINE HCL (PF) 1 % IJ SOLN
INTRAMUSCULAR | Status: AC
  Filled 2021-04-19: qty 30

## 2021-04-19 MED ORDER — FENTANYL CITRATE (PF) 100 MCG/2ML IJ SOLN
25.0000 ug | INTRAMUSCULAR | Status: DC | PRN
  Administered 2021-04-19: 50 ug via INTRAVENOUS

## 2021-04-19 MED ORDER — MEPERIDINE HCL 50 MG/ML IJ SOLN: 6.2500 mg | INTRAMUSCULAR | Status: DC | PRN

## 2021-04-19 MED ORDER — PROPOFOL 10 MG/ML IV BOLUS
INTRAVENOUS | Status: DC | PRN
  Administered 2021-04-19: 150 mg via INTRAVENOUS

## 2021-04-19 SURGICAL SUPPLY — 57 items
BNDG COHESIVE 4X5 TAN STRL (GAUZE/BANDAGES/DRESSINGS) ×2 IMPLANT
BNDG COHESIVE 6X5 TAN STRL LF (GAUZE/BANDAGES/DRESSINGS) ×1 IMPLANT
BNDG CONFORM 2 STRL LF (GAUZE/BANDAGES/DRESSINGS) ×1 IMPLANT
BNDG CONFORM 3 STRL LF (GAUZE/BANDAGES/DRESSINGS) ×1 IMPLANT
BNDG ELASTIC 4X5.8 VLCR STR LF (GAUZE/BANDAGES/DRESSINGS) ×2 IMPLANT
BNDG ESMARK 4X12 TAN STRL LF (GAUZE/BANDAGES/DRESSINGS) ×1 IMPLANT
BNDG GAUZE 4.5X4.1 6PLY STRL (MISCELLANEOUS) ×2 IMPLANT
COVER WAND RF STERILE (DRAPES) ×1 IMPLANT
CUFF TOURN SGL QUICK 18X4 (TOURNIQUET CUFF) IMPLANT
CUFF TOURN SGL QUICK 24 (TOURNIQUET CUFF)
CUFF TRNQT CYL 24X4X16.5-23 (TOURNIQUET CUFF) IMPLANT
DRSG ADAPTIC 3X8 NADH LF (GAUZE/BANDAGES/DRESSINGS) ×2 IMPLANT
DURAPREP 26ML APPLICATOR (WOUND CARE) ×1 IMPLANT
ELECT REM PT RETURN 9FT ADLT (ELECTROSURGICAL) ×2
ELECTRODE REM PT RTRN 9FT ADLT (ELECTROSURGICAL) ×1 IMPLANT
GAUZE PACKING 1/4 X5 YD (GAUZE/BANDAGES/DRESSINGS) IMPLANT
GAUZE PACKING IODOFORM 1X5 (PACKING) ×1 IMPLANT
GAUZE SPONGE 4X4 12PLY STRL (GAUZE/BANDAGES/DRESSINGS) ×2 IMPLANT
GLOVE BIOGEL M 8.0 STRL (GLOVE) ×2 IMPLANT
GLOVE SRG 8 PF TXTR STRL LF DI (GLOVE) ×1 IMPLANT
GLOVE SURG UNDER POLY LF SZ8 (GLOVE) ×1
GOWN STRL REUS W/ TWL XL LVL3 (GOWN DISPOSABLE) ×1 IMPLANT
GOWN STRL REUS W/TWL MED LVL3 (GOWN DISPOSABLE) ×2 IMPLANT
GOWN STRL REUS W/TWL XL LVL3 (GOWN DISPOSABLE) ×1
HANDPIECE VERSAJET DEBRIDEMENT (MISCELLANEOUS) IMPLANT
IV NS 1000ML (IV SOLUTION)
IV NS 1000ML BAXH (IV SOLUTION) ×1 IMPLANT
IV NS IRRIG 3000ML ARTHROMATIC (IV SOLUTION) ×1 IMPLANT
KIT TURNOVER KIT A (KITS) ×2 IMPLANT
LABEL OR SOLS (LABEL) ×2 IMPLANT
MANIFOLD NEPTUNE II (INSTRUMENTS) ×2 IMPLANT
MICROMATRIX 1000MG (Tissue) ×2 IMPLANT
NDL FILTER BLUNT 18X1 1/2 (NEEDLE) ×1 IMPLANT
NDL HYPO 25X1 1.5 SAFETY (NEEDLE) ×1 IMPLANT
NEEDLE FILTER BLUNT 18X 1/2SAF (NEEDLE) ×1
NEEDLE FILTER BLUNT 18X1 1/2 (NEEDLE) ×1 IMPLANT
NEEDLE HYPO 25X1 1.5 SAFETY (NEEDLE) ×2 IMPLANT
NS IRRIG 500ML POUR BTL (IV SOLUTION) ×2 IMPLANT
PACK EXTREMITY ARMC (MISCELLANEOUS) ×2 IMPLANT
PAD ABD DERMACEA PRESS 5X9 (GAUZE/BANDAGES/DRESSINGS) ×2 IMPLANT
PULSAVAC PLUS IRRIG FAN TIP (DISPOSABLE) ×2
SOL PREP PVP 2OZ (MISCELLANEOUS) ×2
SOLUTION PARTIC MCRMTRX 1000MG (Tissue) IMPLANT
SOLUTION PREP PVP 2OZ (MISCELLANEOUS) ×1 IMPLANT
STAPLER SKIN PROX 35W (STAPLE) ×1 IMPLANT
STOCKINETTE IMPERVIOUS 9X36 MD (GAUZE/BANDAGES/DRESSINGS) ×2 IMPLANT
SUT ETHILON 2 0 FS 18 (SUTURE) IMPLANT
SUT ETHILON 4-0 (SUTURE)
SUT ETHILON 4-0 FS2 18XMFL BLK (SUTURE)
SUT PROLENE 3 0 PS 2 (SUTURE) IMPLANT
SUT VIC AB 3-0 SH 27 (SUTURE)
SUT VIC AB 3-0 SH 27X BRD (SUTURE) IMPLANT
SUT VIC AB 4-0 FS2 27 (SUTURE) IMPLANT
SUTURE ETHLN 4-0 FS2 18XMF BLK (SUTURE) IMPLANT
SWAB CULTURE AMIES ANAERIB BLU (MISCELLANEOUS) IMPLANT
SYR 10ML LL (SYRINGE) ×4 IMPLANT
TIP FAN IRRIG PULSAVAC PLUS (DISPOSABLE) IMPLANT

## 2021-04-19 NOTE — Anesthesia Postprocedure Evaluation (Signed)
Anesthesia Post Note  Patient: CEASER EBELING  Procedure(s) Performed: DEBRIDEMENT WOUND (Right )  Patient location during evaluation: PACU Anesthesia Type: General Level of consciousness: awake and alert, awake and oriented Pain management: pain level controlled Vital Signs Assessment: post-procedure vital signs reviewed and stable Respiratory status: spontaneous breathing, nonlabored ventilation and respiratory function stable Cardiovascular status: blood pressure returned to baseline and stable Postop Assessment: no apparent nausea or vomiting Anesthetic complications: no   No complications documented.   Last Vitals:  Vitals:   04/19/21 1413 04/19/21 1430  BP:  117/85  Pulse: 73 71  Resp: (!) 22 14  Temp: 36.6 C 36.8 C  SpO2: 98% 98%    Last Pain:  Vitals:   04/19/21 1430  TempSrc: Oral  PainSc:                  Phill Mutter

## 2021-04-19 NOTE — Anesthesia Preprocedure Evaluation (Signed)
Anesthesia Evaluation  Patient identified by MRN, date of birth, ID band Patient awake    Reviewed: Allergy & Precautions, NPO status , Patient's Chart, lab work & pertinent test results, reviewed documented beta blocker date and time   Airway Mallampati: II  TM Distance: >3 FB Neck ROM: Full    Dental no notable dental hx.    Pulmonary neg pulmonary ROS,    Pulmonary exam normal        Cardiovascular hypertension, Pt. on medications and Pt. on home beta blockers + Peripheral Vascular Disease  Normal cardiovascular exam     Neuro/Psych negative neurological ROS  negative psych ROS   GI/Hepatic negative GI ROS, Neg liver ROS,   Endo/Other  diabetes, Poorly Controlled, Type 2  Renal/GU Renal disease     Musculoskeletal negative musculoskeletal ROS (+)   Abdominal Normal abdominal exam  (+)   Peds  Hematology  (+) anemia ,   Anesthesia Other Findings Past Medical History: No date: Diabetes mellitus without complication (HCC) No date: Hyperlipidemia No date: Hypertension   Reproductive/Obstetrics                             Anesthesia Physical  Anesthesia Plan  ASA: III  Anesthesia Plan: General   Post-op Pain Management:    Induction: Intravenous  PONV Risk Score and Plan: 2  Airway Management Planned: LMA  Additional Equipment:   Intra-op Plan:   Post-operative Plan: Extubation in OR  Informed Consent: I have reviewed the patients History and Physical, chart, labs and discussed the procedure including the risks, benefits and alternatives for the proposed anesthesia with the patient or authorized representative who has indicated his/her understanding and acceptance.     Dental advisory given  Plan Discussed with: CRNA, Surgeon and Anesthesiologist  Anesthesia Plan Comments:         Anesthesia Quick Evaluation                                  Anesthesia  Evaluation  Patient identified by MRN, date of birth, ID band Patient awake    Reviewed: Allergy & Precautions, H&P , NPO status , Patient's Chart, lab work & pertinent test results, reviewed documented beta blocker date and time   History of Anesthesia Complications Negative for: history of anesthetic complications  Airway Mallampati: I  TM Distance: >3 FB Neck ROM: full    Dental  (+) Dental Advidsory Given, Teeth Intact   Pulmonary neg pulmonary ROS,    Pulmonary exam normal breath sounds clear to auscultation       Cardiovascular Exercise Tolerance: Good hypertension, (-) angina(-) Past MI and (-) Cardiac Stents Normal cardiovascular exam(-) dysrhythmias (-) Valvular Problems/Murmurs Rhythm:regular Rate:Normal     Neuro/Psych negative neurological ROS  negative psych ROS   GI/Hepatic negative GI ROS, Neg liver ROS,   Endo/Other  diabetes  Renal/GU CRFRenal disease  negative genitourinary   Musculoskeletal   Abdominal   Peds  Hematology negative hematology ROS (+)   Anesthesia Other Findings Past Medical History: No date: Diabetes mellitus without complication (HCC) No date: Hyperlipidemia No date: Hypertension   Reproductive/Obstetrics negative OB ROS                            Anesthesia Physical Anesthesia Plan  ASA: III and emergent  Anesthesia Plan: General  Post-op Pain Management:    Induction: Intravenous and Rapid sequence  PONV Risk Score and Plan: 2 and Ondansetron, Dexamethasone, Midazolam and Treatment may vary due to age or medical condition  Airway Management Planned: Oral ETT  Additional Equipment:   Intra-op Plan:   Post-operative Plan: Extubation in OR  Informed Consent: I have reviewed the patients History and Physical, chart, labs and discussed the procedure including the risks, benefits and alternatives for the proposed anesthesia with the patient or authorized representative who has  indicated his/her understanding and acceptance.     Dental Advisory Given  Plan Discussed with: Anesthesiologist, CRNA and Surgeon  Anesthesia Plan Comments: (Patient refused COVID testing, but the case was emergent thus we proceeded as a suspected positive.  Patient will receive a rapid sequence intubation to avoid aerosolization, but no cricoid will be done as we are not trying to prevent aspiration.)      Anesthesia Quick Evaluation   

## 2021-04-19 NOTE — Progress Notes (Signed)
PT Cancellation Note  Patient Details Name: [name] MRN: [id number] DOB: 14-Dec-1965   Cancelled Treatment:    Reason Eval/Treat Not Completed: Patient not medically ready.  Pt currently in PACU--s/p procedure today.  Will re-attempt PT evaluation tomorrow.  Leitha Bleak, PT 04/19/21, 1:36 PM

## 2021-04-19 NOTE — Anesthesia Procedure Notes (Signed)
Procedure Name: Intubation Date/Time: 04/19/2021 12:24 PM Performed by: Phill Mutter, MD Pre-anesthesia Checklist: Patient identified, Emergency Drugs available, Suction available and Patient being monitored Patient Re-evaluated:Patient Re-evaluated prior to induction Oxygen Delivery Method: Circle system utilized Preoxygenation: Pre-oxygenation with 100% oxygen Induction Type: IV induction Ventilation: Mask ventilation without difficulty LMA: LMA inserted LMA Size: 4.0 Tube type: Oral Placement Confirmation: positive ETCO2 and breath sounds checked- equal and bilateral Tube secured with: Tape Dental Injury: Teeth and Oropharynx as per pre-operative assessment

## 2021-04-19 NOTE — Plan of Care (Signed)

## 2021-04-19 NOTE — Brief Op Note (Signed)
04/19/2021  1:29 PM  PATIENT:  [name]  55 y.o. male  PRE-OPERATIVE DIAGNOSIS:  Right foot ulcer  POST-OPERATIVE DIAGNOSIS:  Right foot ulcer  PROCEDURE:  Procedure(s): DEBRIDEMENT WOUND (Right)  SURGEON:  Surgeon(s) and Role:    Edrick Kins, DPM - Primary  PHYSICIAN ASSISTANT:   ASSISTANTS: none   ANESTHESIA:   local and general  EBL:  10 mL   BLOOD ADMINISTERED:none  DRAINS: none   LOCAL MEDICATIONS USED:  MARCAINE    and XYLOCAINE   SPECIMEN:  No Specimen  DISPOSITION OF SPECIMEN:  N/A  COUNTS:  YES  TOURNIQUET:  * Missing tourniquet times found for documented tourniquets in log: [id number] *  DICTATION: .Dragon Dictation  PLAN OF CARE: Admit to inpatient   PATIENT DISPOSITION:  PACU - hemodynamically stable.   Delay start of Pharmacological VTE agent (>24hrs) due to surgical blood loss or risk of bleeding: not applicable

## 2021-04-19 NOTE — Progress Notes (Signed)
[place] INFECTIOUS DISEASE PROGRESS NOTE Date of Admission:  04/12/2021     ID: [name] is a 55 y.o. male with  Diabetic foot infection Principal Problem:   Cellulitis of right foot Active Problems:   Hypertension   Hyperglycemia due to type 2 diabetes mellitus ([place])   Diabetic ulcer of foot with fat layer exposed ([place])   PAD (peripheral artery disease) ([place])   AKI (acute kidney injury) ([place])   Anemia   Subjective: S/p repeat surg. Op note pendiong. Pt resting   ROS  Eleven systems are reviewed and negative except per hpi  Medications:  Antibiotics Given (last 72 hours)    Date/Time Action Medication Dose Rate   04/16/21 1556 New Bag/Given  [pt off unit in OR]   vancomycin (VANCOREADY) IVPB 1250 mg/250 mL 1,250 mg 166.7 mL/hr   04/16/21 1728 New Bag/Given   [MAR Hold] metroNIDAZOLE (FLAGYL) IVPB 500 mg (MAR Hold since Tue 04/19/2021 at 1145.Hold Reason: Transfer to a Procedural area.) 500 mg 100 mL/hr   04/16/21 2200 New Bag/Given   [MAR Hold] metroNIDAZOLE (FLAGYL) IVPB 500 mg (MAR Hold since Tue 04/19/2021 at 1145.Hold Reason: Transfer to a Procedural area.) 500 mg 100 mL/hr   04/16/21 2316 New Bag/Given   vancomycin (VANCOREADY) IVPB 1250 mg/250 mL 1,250 mg 166.7 mL/hr   04/17/21 0357 New Bag/Given   [MAR Hold] cefTRIAXone (ROCEPHIN) 1 g in sodium chloride 0.9 % 100 mL IVPB (MAR Hold since Tue 04/19/2021 at 1145.Hold Reason: Transfer to a Procedural area.) 1 g 200 mL/hr   04/17/21 0627 New Bag/Given   [MAR Hold] metroNIDAZOLE (FLAGYL) IVPB 500 mg (MAR Hold since Tue 04/19/2021 at 1145.Hold Reason: Transfer to a Procedural area.) 500 mg 100 mL/hr   04/17/21 1219 New Bag/Given   vancomycin (VANCOREADY) IVPB 1250 mg/250 mL 1,250 mg 166.7 mL/hr   04/17/21 1453 New Bag/Given   [MAR Hold] metroNIDAZOLE (FLAGYL) IVPB 500 mg (MAR Hold since Tue 04/19/2021 at 1145.Hold Reason: Transfer to a Procedural area.) 500 mg 100 mL/hr   04/17/21 2211 New Bag/Given   [MAR Hold]  metroNIDAZOLE (FLAGYL) IVPB 500 mg (MAR Hold since Tue 04/19/2021 at 1145.Hold Reason: Transfer to a Procedural area.) 500 mg 100 mL/hr   04/17/21 2327 New Bag/Given   vancomycin (VANCOREADY) IVPB 1250 mg/250 mL 1,250 mg 166.7 mL/hr   04/18/21 0351 New Bag/Given   [MAR Hold] cefTRIAXone (ROCEPHIN) 1 g in sodium chloride 0.9 % 100 mL IVPB (MAR Hold since Tue 04/19/2021 at 1145.Hold Reason: Transfer to a Procedural area.) 1 g 200 mL/hr   04/18/21 0728 New Bag/Given   [MAR Hold] metroNIDAZOLE (FLAGYL) IVPB 500 mg (MAR Hold since Tue 04/19/2021 at 1145.Hold Reason: Transfer to a Procedural area.) 500 mg 100 mL/hr   04/18/21 1531 New Bag/Given   [MAR Hold] metroNIDAZOLE (FLAGYL) IVPB 500 mg (MAR Hold since Tue 04/19/2021 at 1145.Hold Reason: Transfer to a Procedural area.) 500 mg 100 mL/hr   04/18/21 2146 New Bag/Given   [MAR Hold] vancomycin (VANCOREADY) IVPB 1500 mg/300 mL (MAR Hold since Tue 04/19/2021 at 1145.Hold Reason: Transfer to a Procedural area.) 1,500 mg 150 mL/hr   04/18/21 2332 New Bag/Given   [MAR Hold] metroNIDAZOLE (FLAGYL) IVPB 500 mg (MAR Hold since Tue 04/19/2021 at 1145.Hold Reason: Transfer to a Procedural area.) 500 mg 100 mL/hr   04/19/21 0427 New Bag/Given   [MAR Hold] cefTRIAXone (ROCEPHIN) 1 g in sodium chloride 0.9 % 100 mL IVPB (MAR Hold since Tue 04/19/2021 at 1145.Hold Reason: Transfer to a Procedural  area.) 1 g 200 mL/hr   04/19/21 0653 New Bag/Given   [MAR Hold] metroNIDAZOLE (FLAGYL) IVPB 500 mg (MAR Hold since Tue 04/19/2021 at 1145.Hold Reason: Transfer to a Procedural area.) 500 mg 100 mL/hr     . Chlorhexidine Gluconate Cloth  6 each Topical Once   And  . Chlorhexidine Gluconate Cloth  6 each Topical Once  . fentaNYL      . [MAR Hold] gentamicin ointment   Topical BID  . [MAR Hold] irbesartan  300 mg Oral Daily   And  . [MAR Hold] hydrochlorothiazide  25 mg Oral Daily  . [MAR Hold] insulin aspart  0-15 Units Subcutaneous TID WC  . [MAR Hold] insulin aspart  0-5  Units Subcutaneous QHS  . [MAR Hold] insulin detemir  20 Units Subcutaneous Daily  . [MAR Hold] nebivolol  5 mg Oral Daily  . [MAR Hold] potassium chloride  40 mEq Oral Q4H  . [MAR Hold] rosuvastatin  40 mg Oral Daily    Objective: Vital signs in last 24 hours: Temp:  [97.6 F (36.4 C)-99.4 F (37.4 C)] 97.6 F (36.4 C) (05/10 1305) Pulse Rate:  [66-74] 73 (05/10 1345) Resp:  [15-21] 15 (05/10 1345) BP: (98-136)/(56-80) 130/69 (05/10 1345) SpO2:  [96 %-100 %] 96 % (05/10 1345) Weight:  [90.8 kg] 90.8 kg (05/10 1146) Constitutional: He is oriented to person, place, and time. He appears well-developed and well-nourished. No distress.  HENT:  Mouth/Throat: Oropharynx is clear and moist. No oropharyngeal exudate.  Cardiovascular: Normal rate, regular rhythm and normal heart sounds. Exam reveals no gallop and no friction rub.  No murmur heard.  Pulmonary/Chest: Effort normal and breath sounds normal. No respiratory distress. He has no wheezes.  Abdominal: Soft. Bowel sounds are normal. He exhibits no distension. There is no tenderness.  Lymphadenopathy:  He has no cervical adenopathy.  Neurological: He is alert and oriented to person, place, and time.  Skin: Foot wrapped and pt refuses exam due to pain Psychiatric: He has a normal mood and affect. His behavior is normal.    Lab Results Recent Labs    04/18/21 0528 04/19/21 0312  WBC 21.5* 17.9*  HGB 9.1* 7.5*  HCT 27.7* 22.4*  NA 134* 135  K 3.5 3.2*  CL 105 108  CO2 20* 21*  BUN 40* 29*  CREATININE 1.07 0.91    Microbiology: Results for orders placed or performed during the hospital encounter of 04/12/21  SARS CORONAVIRUS 2 (TAT 6-24 HRS) Nasopharyngeal Nasopharyngeal Swab     Status: None   Collection Time: 04/12/21  6:30 PM   Specimen: Nasopharyngeal Swab  Result Value Ref Range Status   SARS Coronavirus 2 NEGATIVE NEGATIVE Final    Comment: (NOTE) SARS-CoV-2 target nucleic acids are NOT DETECTED.  The  SARS-CoV-2 RNA is generally detectable in upper and lower respiratory specimens during the acute phase of infection. Negative results do not preclude SARS-CoV-2 infection, do not rule out co-infections with other pathogens, and should not be used as the sole basis for treatment or other patient management decisions. Negative results must be combined with clinical observations, patient history, and epidemiological information. The expected result is Negative.  Fact Sheet for Patients: SugarRoll.be  Fact Sheet for Healthcare Providers: https://www.woods-mathews.com/  This test is not yet approved or cleared by the [place] FDA and  has been authorized for detection and/or diagnosis of SARS-CoV-2 by FDA under an Emergency Use Authorization (EUA). This EUA will remain  in effect (meaning this test can be  used) for the duration of the COVID-19 declaration under Se ction 564(b)(1) of the Act, 21 U.S.C. section 360bbb-3(b)(1), unless the authorization is terminated or revoked sooner.  Performed at [facility], [place] [address], [place], [place] [address]   Aerobic Culture w Gram Stain (superficial specimen)     Status: None   Collection Time: 04/13/21  8:56 AM   Specimen: Wound  Result Value Ref Range Status   Specimen Description WOUND RIGHT FOOT  Final   Special Requests NONE  Final   Gram Stain   Final    FEW WBC PRESENT,BOTH PMN AND MONONUCLEAR FEW GRAM POSITIVE COCCI RARE GRAM VARIABLE ROD    Culture   Final    RARE STREPTOCOCCUS AGALACTIAE TESTING AGAINST S. AGALACTIAE NOT ROUTINELY PERFORMED DUE TO PREDICTABILITY OF AMP/PEN/VAN SUSCEPTIBILITY. WITIHIN MIXED ORGANISMS Performed at [facility], [place] [address] [place], [place] [address]    Report Status 04/17/2021 FINAL  Final  CULTURE, BLOOD (ROUTINE X 2) w Reflex to ID Panel     Status: None (Preliminary result)   Collection Time: 04/14/21  8:27 AM   Specimen:  BLOOD  Result Value Ref Range Status   Specimen Description BLOOD LEFT HAND  Final   Special Requests   Final    BOTTLES DRAWN AEROBIC AND ANAEROBIC Blood Culture adequate volume   Culture   Final    NO GROWTH 4 DAYS Performed at [facility], [address] [place], [place] [address]    Report Status PENDING  Incomplete  CULTURE, BLOOD (ROUTINE X 2) w Reflex to ID Panel     Status: None (Preliminary result)   Collection Time: 04/14/21  8:28 AM   Specimen: BLOOD  Result Value Ref Range Status   Specimen Description BLOOD  RIGHT HAND  Final   Special Requests   Final    BOTTLES DRAWN AEROBIC AND ANAEROBIC Blood Culture adequate volume   Culture   Final    NO GROWTH 4 DAYS Performed at [facility], [address] [place], [place] [address]    Report Status PENDING  Incomplete  Aerobic/Anaerobic Culture w Gram Stain (surgical/deep wound)     Status: None (Preliminary result)   Collection Time: 04/16/21  1:47 PM   Specimen: PATH Other; Abscess  Result Value Ref Range Status   Specimen Description ABSCESS RIGHT FOOT  Final   Special Requests SWAB SPEC A  Final   Gram Stain   Final    ABUNDANT WBC PRESENT, PREDOMINANTLY PMN RARE GRAM POSITIVE COCCI IN PAIRS    Culture   Final    NO GROWTH 3 DAYS NO ANAEROBES ISOLATED; CULTURE IN PROGRESS FOR 5 DAYS Performed at [facility], [place] [address] [place], [place] [address]    Report Status PENDING  Incomplete    Studies/Results: No results found.  Assessment/Plan: [name] is a 55 y.o. male with diabetic foot osteomyelitis of the right foot in a patient with poorly controlled diabetes with an A1c of 9.9.  He has undergone MRI which showed osteomyelitis of the right fifth metatarsal.  He is now status post amputation of the right fifth metatarsal with extensive findings of purulence.  He would likely need repeat surgery.  He has had an angiogram which was negative for any stenosis.  He has been on  initially cefepime and metronidazole and vancomycin.  The cefepime was recently changed to ceftriaxone.  His white count remains elevated at 21.  He did initially  have fevers but those seem to be improving. Cxs mixed but with strep agalactae.  Of note he was on doxycycline at the time of admission so cultures may be affected. 5/10- repeat debridement done today. WBC down to 17. Cxs with grp B strep within mixed organisms. No  MRSA, NO psuedomonas  Recommendations Path is pending Can dc vanco as no MRSA found but monitor closely as was on doxy at time of culture.  Given extent of infection will likely need IV abx initially for 2-6 weeks with final duration based on path results, findings at surgery today and clinical response. I discussed this with patient and will need to review podiatry op note and review with them  Thank you very much for the consult. Will follow with you.  Leonel Ramsay   04/19/2021, 1:47 PM

## 2021-04-19 NOTE — Interval H&P Note (Signed)
History and Physical Interval Note:  04/19/2021 8:04 AM  [name]  has presented today for surgery, with the diagnosis of Right foot ulcer.  The various methods of treatment have been discussed with the patient and family. After consideration of risks, benefits and other options for treatment, the patient has consented to  Procedure(s): DEBRIDEMENT WOUND (Right) as a surgical intervention.  The patient's history has been reviewed, patient examined, no change in status, stable for surgery.  I have reviewed the patient's chart and labs.  Questions were answered to the patient's satisfaction.     Edrick Kins

## 2021-04-19 NOTE — Telephone Encounter (Signed)
Spoke with wife and I have scheduled him an appointment with dr Ancil Boozer for 5.19.2022 Copied from [place] [phone]. Topic: General - Other >> Apr 19, 2021  2:54 PM Celene Kras wrote: Reason for CRM: Pts wife called stating that the pt is currently in the hospital and they had to amputate his right pinky toe. She states that the pt has been advised by provider to contact PCP in order to get started with setting up disability. Please advise.

## 2021-04-19 NOTE — Transfer of Care (Signed)
Immediate Anesthesia Transfer of Care Note  Patient: [name]  Procedure(s) Performed: DEBRIDEMENT WOUND (Right )  Patient Location: PACU  Anesthesia Type:General  Level of Consciousness: drowsy  Airway & Oxygen Therapy: Patient Spontanous Breathing and Patient connected to face mask oxygen  Post-op Assessment: Report given to RN and Post -op Vital signs reviewed and stable  Post vital signs: Reviewed and stable  Last Vitals:  Vitals Value Taken Time  BP 98/56 04/19/21 1305  Temp    Pulse 73 04/19/21 1308  Resp 17 04/19/21 1308  SpO2 100 % 04/19/21 1308  Vitals shown include unvalidated device data.  Last Pain:  Vitals:   04/19/21 1146  TempSrc: Oral  PainSc: 0-No pain      Patients Stated Pain Goal: 1 (88/50/27 7412)  Complications: No complications documented.

## 2021-04-19 NOTE — Progress Notes (Addendum)
Patient ID: [name], male   DOB: [birth year], 55 y.o.   MRN: [id number]  PROGRESS NOTE    [name]  [id number] DOB: January 16, 1966 DOA: 04/12/2021 PCP: [name], MD   Brief Narrative:   55 y.o. male with medical history significant for DM type II, HTN, PAD with history of left partial fifth metatarsal base resection July 2021 after presenting with gangrene presented with worsening right foot pain and worsening foot ulcer despite undergoing excisional debridement on 04/05/2021 by podiatry and oral doxycycline.  On presentation, he had WBCs of 26,000 with creatinine of 1.38, up from 1.02 which is his baseline.  X-ray of the right foot showed soft tissue ulceration of lateral aspect of midfoot with no evidence of underlying osteomyelitis with diffuse soft tissue edema.  He was started on broad-spectrum antibiotics.  Podiatry and vascular surgery were consulted.  He underwent aortogram and right lower extremity angiogram on 04/14/2021.  He underwent fifth ray amputation of right foot and I&D of right foot on 04/16/2021 by podiatry.  ID was consulted on 04/18/2021.  Assessment & Plan:   Cellulitis of right foot/diabetic right foot ulcer infection with osteomyelitis PAD with history of left metatarsal base amputation Leukocytosis -Patient presented with worsening right foot infected ulcer despite undergoing wound debridement on 04/05/2021 and outpatient doxycycline -Currently on broad-spectrum antibiotics Rocephin, Flagyl and vancomycin.   -MRI of the right foot was consistent with osteomyelitis.  -Status post aortogram and right lower extremity angiogram done by vascular surgery on 04/14/2021.  Vascular surgery has signed off.  -No temperature spikes over the last 24 hours.  Blood cultures negative so far -Continue rosuvastatin for PAD.  Not on antiplatelets -WBCs improving.  Monitor -Status post fifth ray amputation of right foot and I&D of right foot on 04/16/2021 by podiatry.  OR cultures  growing rare strep agalactiae.  Wound care as per podiatry recommendations.  Podiatry is planning for more surgical intervention today evening. -ID following.  Diabetes mellitus type II with hyperglycemia -A1c 9.9.  Continue CBGs with SSI.  Continue Levemir.  Hypertension -Monitor blood pressure.  Continue irbesartan/hydrochlorothiazide/nebivolol  Acute kidney injury -Presented with creatinine of 1.38: Baseline of around 1 to 2 months ago.  Treated with IV fluids.  Resolved.  Anemia -Unknown cause.  Hemoglobin 7.5 today.  No signs of bleeding.  Transfuse if hemoglobin is less than 7.  Outpatient follow-up.  Hypokalemia -Replace.  Repeat a.m. labs  Thrombocytosis -Possibly reactive.  Monitor  DVT prophylaxis: SCDs Code Status: Full Family Communication: Wife at bedside on 04/14/2021 Disposition Plan: Status is: Inpatient  Remains inpatient appropriate because:Inpatient level of care appropriate due to severity of illness   Dispo: The patient is from: Home              Anticipated d/c is to: Home              Patient currently is not medically stable to d/c.   Difficult to place patient No  Consultants: Podiatry/vascular surgery/ID  Procedures:   aortogram and right lower extremity angiogram done by vascular surgery on 04/14/2021 fifth ray amputation of right foot and I&D of right foot on 04/16/2021   Antimicrobials: Rocephin and vancomycin from 04/12/2021 onwards.  Flagyl from 04/13/2021 onwards   Subjective: Patient seen and examined at bedside.  Denies fever, worsening abdominal pain, vomiting.  Still complains of intermittent right foot pain. Objective: Vitals:   04/18/21 0844 04/18/21 1538 04/18/21 2052 04/19/21 0415  BP: 116/62 122/70 119/67 (!) 111/58  Pulse: 68 74 71 68  Resp: 17 18 17 17   Temp: 98.7 F (37.1 C) 98.3 F (36.8 C) 98.3 F (36.8 C) 99.1 F (37.3 C)  TempSrc:      SpO2: 97% 99% 98% 99%  Weight:      Height:        Intake/Output Summary (Last 24  hours) at 04/19/2021 0740 Last data filed at 04/19/2021 0300 Gross per 24 hour  Intake 894.65 ml  Output --  Net 894.65 ml   Filed Weights   04/13/21 0116 04/14/21 1640  Weight: 90.8 kg 90.8 kg    Examination:  General exam: On room air currently.  No distress. Respiratory system: Bilateral decreased breath sounds at bases with some scattered crackles  cardiovascular system: S1-S2 heard, rate controlled gastrointestinal system: Abdomen is distended slightly, soft and nontender.  Normal bowel sounds heard  extremities: No clubbing; trace lower extremity edema present.  Right foot dressing present  Data Reviewed: I have personally reviewed following labs and imaging studies  CBC: Recent Labs  Lab 04/15/21 0527 04/16/21 0530 04/17/21 0535 04/18/21 0528 04/19/21 0312  WBC 24.3* 24.3* 27.8* 21.5* 17.9*  NEUTROABS 20.8* 20.7* 24.5* 17.8* 14.2*  HGB 9.0* 9.0* 8.2* 9.1* 7.5*  HCT 27.0* 26.5* 25.3* 27.7* 22.4*  MCV 91.8 91.4 94.8 93.9 91.4  PLT 417* 395 397 538* 884*   Basic Metabolic Panel: Recent Labs  Lab 04/15/21 0527 04/16/21 0530 04/17/21 0535 04/18/21 0528 04/19/21 0312  NA 137 136 135 134* 135  K 3.5 3.3* 4.1 3.5 3.2*  CL 107 107 107 105 108  CO2 21* 21* 18* 20* 21*  GLUCOSE 130* 141* 251* 226* 136*  BUN 35* 30* 32* 40* 29*  CREATININE 0.87 0.93 0.99 1.07 0.91  CALCIUM 8.7* 8.5* 8.4* 8.5* 8.4*  MG 2.1 1.9 2.0 2.0 1.8   GFR: Estimated Creatinine Clearance: 101.9 mL/min (by C-G formula based on SCr of 0.91 mg/dL). Liver Function Tests: Recent Labs  Lab 04/12/21 1631 04/13/21 0511  AST 12* 8*  ALT 11 9  ALKPHOS 86 75  BILITOT 3.7* 2.6*  PROT 8.0 6.6  ALBUMIN 2.7* 2.0*   No results for input(s): LIPASE, AMYLASE in the last 168 hours. No results for input(s): AMMONIA in the last 168 hours. Coagulation Profile: No results for input(s): INR, PROTIME in the last 168 hours. Cardiac Enzymes: No results for input(s): CKTOTAL, CKMB, CKMBINDEX, TROPONINI in  the last 168 hours. BNP (last 3 results) No results for input(s): PROBNP in the last 8760 hours. HbA1C: No results for input(s): HGBA1C in the last 72 hours. CBG: Recent Labs  Lab 04/17/21 2121 04/18/21 0726 04/18/21 1253 04/18/21 1639 04/18/21 2052  GLUCAP 287* 205* 239* 171* 223*   Lipid Profile: No results for input(s): CHOL, HDL, LDLCALC, TRIG, CHOLHDL, LDLDIRECT in the last 72 hours. Thyroid Function Tests: No results for input(s): TSH, T4TOTAL, FREET4, T3FREE, THYROIDAB in the last 72 hours. Anemia Panel: No results for input(s): VITAMINB12, FOLATE, FERRITIN, TIBC, IRON, RETICCTPCT in the last 72 hours. Sepsis Labs: Recent Labs  Lab 04/12/21 1631 04/12/21 2339  LATICACIDVEN 1.8 1.1    Recent Results (from the past 240 hour(s))  SARS CORONAVIRUS 2 (TAT 6-24 HRS) Nasopharyngeal Nasopharyngeal Swab     Status: None   Collection Time: 04/12/21  6:30 PM   Specimen: Nasopharyngeal Swab  Result Value Ref Range Status   SARS Coronavirus 2 NEGATIVE NEGATIVE Final    Comment: (NOTE) SARS-CoV-2 target nucleic acids are NOT DETECTED.  The  SARS-CoV-2 RNA is generally detectable in upper and lower respiratory specimens during the acute phase of infection. Negative results do not preclude SARS-CoV-2 infection, do not rule out co-infections with other pathogens, and should not be used as the sole basis for treatment or other patient management decisions. Negative results must be combined with clinical observations, patient history, and epidemiological information. The expected result is Negative.  Fact Sheet for Patients: SugarRoll.be  Fact Sheet for Healthcare Providers: https://www.woods-mathews.com/  This test is not yet approved or cleared by the [place] FDA and  has been authorized for detection and/or diagnosis of SARS-CoV-2 by FDA under an Emergency Use Authorization (EUA). This EUA will remain  in effect (meaning this  test can be used) for the duration of the COVID-19 declaration under Se ction 564(b)(1) of the Act, 21 U.S.C. section 360bbb-3(b)(1), unless the authorization is terminated or revoked sooner.  Performed at [facility], [place] [address] [place], [place] [address]   Aerobic Culture w Gram Stain (superficial specimen)     Status: None   Collection Time: 04/13/21  8:56 AM   Specimen: Wound  Result Value Ref Range Status   Specimen Description WOUND RIGHT FOOT  Final   Special Requests NONE  Final   Gram Stain   Final    FEW WBC PRESENT,BOTH PMN AND MONONUCLEAR FEW GRAM POSITIVE COCCI RARE GRAM VARIABLE ROD    Culture   Final    RARE STREPTOCOCCUS AGALACTIAE TESTING AGAINST S. AGALACTIAE NOT ROUTINELY PERFORMED DUE TO PREDICTABILITY OF AMP/PEN/VAN SUSCEPTIBILITY. WITIHIN MIXED ORGANISMS Performed at [facility], [place] [address] [place], [place] [address]    Report Status 04/17/2021 FINAL  Final  CULTURE, BLOOD (ROUTINE X 2) w Reflex to ID Panel     Status: None (Preliminary result)   Collection Time: 04/14/21  8:27 AM   Specimen: BLOOD  Result Value Ref Range Status   Specimen Description BLOOD LEFT HAND  Final   Special Requests   Final    BOTTLES DRAWN AEROBIC AND ANAEROBIC Blood Culture adequate volume   Culture   Final    NO GROWTH 4 DAYS Performed at [facility], [address] [place], [place] [address]    Report Status PENDING  Incomplete  CULTURE, BLOOD (ROUTINE X 2) w Reflex to ID Panel     Status: None (Preliminary result)   Collection Time: 04/14/21  8:28 AM   Specimen: BLOOD  Result Value Ref Range Status   Specimen Description BLOOD  RIGHT HAND  Final   Special Requests   Final    BOTTLES DRAWN AEROBIC AND ANAEROBIC Blood Culture adequate volume   Culture   Final    NO GROWTH 4 DAYS Performed at [facility], [address] [place], [place] [address]    Report Status PENDING  Incomplete  Aerobic/Anaerobic Culture w Gram  Stain (surgical/deep wound)     Status: None (Preliminary result)   Collection Time: 04/16/21  1:47 PM   Specimen: PATH Other; Abscess  Result Value Ref Range Status   Specimen Description ABSCESS RIGHT FOOT  Final   Special Requests SWAB SPEC A  Final   Gram Stain   Final    ABUNDANT WBC PRESENT, PREDOMINANTLY PMN RARE GRAM POSITIVE COCCI IN PAIRS    Culture   Final    NO GROWTH 2 DAYS NO ANAEROBES ISOLATED; CULTURE IN PROGRESS FOR 5 DAYS Performed at [facility], [place] [address], [place], [place] [address]  Report Status PENDING  Incomplete         Radiology Studies: No results found.      Scheduled Meds: . Chlorhexidine Gluconate Cloth  6 each Topical Once   And  . Chlorhexidine Gluconate Cloth  6 each Topical Once  . gentamicin ointment   Topical BID  . irbesartan  300 mg Oral Daily   And  . hydrochlorothiazide  25 mg Oral Daily  . insulin aspart  0-15 Units Subcutaneous TID WC  . insulin aspart  0-5 Units Subcutaneous QHS  . insulin detemir  20 Units Subcutaneous Daily  . nebivolol  5 mg Oral Daily  . rosuvastatin  40 mg Oral Daily   Continuous Infusions: . cefTRIAXone (ROCEPHIN)  IV 1 g (04/19/21 0427)  . metronidazole 500 mg (04/19/21 0653)  . vancomycin 1,500 mg (04/18/21 2146)          Aline August, MD [facility] 04/19/2021, 7:40 AM

## 2021-04-19 NOTE — Progress Notes (Signed)
Inpatient Diabetes Program Recommendations  AACE/ADA: New Consensus Statement on Inpatient Glycemic Control  Target Ranges:  Prepandial:   less than 140 mg/dL      Peak postprandial:   less than 180 mg/dL (1-2 hours)      Critically ill patients:  140 - 180 mg/dL   Results for SEAN, MALINOWSKI (MRN [id number]) as of 04/19/2021 10:56  Ref. Range 04/18/2021 07:26 04/18/2021 12:53 04/18/2021 16:39 04/18/2021 20:52 04/19/2021 08:04  Glucose-Capillary Latest Ref Range: 70 - 99 mg/dL 205 (H) 239 (H) 171 (H) 223 (H) 112 (H)   Review of Glycemic Control  Current orders for Inpatient glycemic control: Levemir 20 units daily, Novolog 0-5 units QHS  Inpatient Diabetes Program Recommendations:    Insulin: Please reorder Novolog 0-15 units TID for correction. Once diet is resumed, may want to consider ordering Novolog 3 units TID with meals for meal coverage if patient eats at least 50% of meals  Thanks, Barnie Alderman, RN, MSN, CDE Diabetes Coordinator Inpatient Diabetes Program [phone] (Team Pager from 8am to 5pm)

## 2021-04-19 NOTE — Plan of Care (Signed)
  Problem: Education: Goal: Knowledge of General Education information will improve Description: Including pain rating scale, medication(s)/side effects and non-pharmacologic comfort measures 04/19/2021 0314 by Bonner Puna, RN Outcome: Progressing 04/19/2021 0310 by Bonner Puna, RN Outcome: Progressing   Problem: Health Behavior/Discharge Planning: Goal: Ability to manage health-related needs will improve 04/19/2021 0314 by Bonner Puna, RN Outcome: Progressing 04/19/2021 0310 by Bonner Puna, RN Outcome: Progressing   Problem: Clinical Measurements: Goal: Ability to maintain clinical measurements within normal limits will improve 04/19/2021 0314 by Bonner Puna, RN Outcome: Progressing 04/19/2021 0310 by Bonner Puna, RN Outcome: Progressing Goal: Will remain free from infection 04/19/2021 0314 by Bonner Puna, RN Outcome: Progressing 04/19/2021 0310 by Bonner Puna, RN Outcome: Progressing Goal: Diagnostic test results will improve 04/19/2021 0314 by Bonner Puna, RN Outcome: Progressing 04/19/2021 0310 by Bonner Puna, RN Outcome: Progressing Goal: Respiratory complications will improve 04/19/2021 0314 by Bonner Puna, RN Outcome: Progressing 04/19/2021 0310 by Bonner Puna, RN Outcome: Progressing Goal: Cardiovascular complication will be avoided 04/19/2021 0314 by Bonner Puna, RN Outcome: Progressing 04/19/2021 0310 by Bonner Puna, RN Outcome: Progressing   Problem: Activity: Goal: Risk for activity intolerance will decrease 04/19/2021 0314 by Bonner Puna, RN Outcome: Progressing 04/19/2021 0310 by Bonner Puna, RN Outcome: Progressing   Problem: Nutrition: Goal: Adequate nutrition will be maintained 04/19/2021 0314 by Bonner Puna, RN Outcome: Progressing 04/19/2021 0310 by Bonner Puna, RN Outcome: Progressing   Problem: Coping: Goal: Level of anxiety will decrease 04/19/2021 0314 by Bonner Puna, RN Outcome: Progressing 04/19/2021 0310 by Bonner Puna, RN Outcome: Progressing    Problem: Elimination: Goal: Will not experience complications related to bowel motility 04/19/2021 0314 by Bonner Puna, RN Outcome: Progressing 04/19/2021 0310 by Bonner Puna, RN Outcome: Progressing Goal: Will not experience complications related to urinary retention 04/19/2021 0314 by Bonner Puna, RN Outcome: Progressing 04/19/2021 0310 by Bonner Puna, RN Outcome: Progressing   Problem: Pain Managment: Goal: General experience of comfort will improve 04/19/2021 0314 by Bonner Puna, RN Outcome: Progressing 04/19/2021 0310 by Bonner Puna, RN Outcome: Progressing   Problem: Safety: Goal: Ability to remain free from injury will improve 04/19/2021 0314 by Bonner Puna, RN Outcome: Progressing 04/19/2021 0310 by Bonner Puna, RN Outcome: Progressing   Problem: Skin Integrity: Goal: Risk for impaired skin integrity will decrease 04/19/2021 0314 by Bonner Puna, RN Outcome: Progressing 04/19/2021 0310 by Bonner Puna, RN Outcome: Progressing

## 2021-04-20 ENCOUNTER — Encounter: Payer: Self-pay | Admitting: Podiatry

## 2021-04-20 ENCOUNTER — Inpatient Hospital Stay: Payer: Self-pay

## 2021-04-20 LAB — CBC WITH DIFFERENTIAL/PLATELET
Abs Immature Granulocytes: 0.4 10*3/uL — ABNORMAL HIGH (ref 0.00–0.07)
Basophils Absolute: 0 10*3/uL (ref 0.0–0.1)
Basophils Relative: 0 %
Eosinophils Absolute: 0 10*3/uL (ref 0.0–0.5)
Eosinophils Relative: 0 %
HCT: 23.7 % — ABNORMAL LOW (ref 39.0–52.0)
Hemoglobin: 7.3 g/dL — ABNORMAL LOW (ref 13.0–17.0)
Immature Granulocytes: 3 %
Lymphocytes Relative: 7 %
Lymphs Abs: 1.1 10*3/uL (ref 0.7–4.0)
MCH: 29.4 pg (ref 26.0–34.0)
MCHC: 30.8 g/dL (ref 30.0–36.0)
MCV: 95.6 fL (ref 80.0–100.0)
Monocytes Absolute: 1.1 10*3/uL — ABNORMAL HIGH (ref 0.1–1.0)
Monocytes Relative: 7 %
Neutro Abs: 13.6 10*3/uL — ABNORMAL HIGH (ref 1.7–7.7)
Neutrophils Relative %: 83 %
Platelets: 428 10*3/uL — ABNORMAL HIGH (ref 150–400)
RBC: 2.48 MIL/uL — ABNORMAL LOW (ref 4.22–5.81)
RDW: 12.5 % (ref 11.5–15.5)
WBC: 16.3 10*3/uL — ABNORMAL HIGH (ref 4.0–10.5)
nRBC: 0 % (ref 0.0–0.2)

## 2021-04-20 LAB — C-REACTIVE PROTEIN: CRP: 11.3 mg/dL — ABNORMAL HIGH (ref <1.0)

## 2021-04-20 LAB — BASIC METABOLIC PANEL
Anion gap: 7 (ref 5–15)
BUN: 23 mg/dL — ABNORMAL HIGH (ref 6–20)
CO2: 21 mmol/L — ABNORMAL LOW (ref 22–32)
Calcium: 8.1 mg/dL — ABNORMAL LOW (ref 8.9–10.3)
Chloride: 108 mmol/L (ref 98–111)
Creatinine, Ser: 0.88 mg/dL (ref 0.61–1.24)
GFR, Estimated: >60 mL/min (ref >60)
Glucose, Bld: 236 mg/dL — ABNORMAL HIGH (ref 70–99)
Potassium: 3.8 mmol/L (ref 3.5–5.1)
Sodium: 136 mmol/L (ref 135–145)

## 2021-04-20 LAB — GLUCOSE, CAPILLARY
Glucose-Capillary: 226 mg/dL — ABNORMAL HIGH (ref 70–99)
Glucose-Capillary: 176 mg/dL — ABNORMAL HIGH (ref 70–99)
Glucose-Capillary: 145 mg/dL — ABNORMAL HIGH (ref 70–99)
Glucose-Capillary: 108 mg/dL — ABNORMAL HIGH (ref 70–99)

## 2021-04-20 LAB — SURGICAL PATHOLOGY

## 2021-04-20 LAB — MAGNESIUM: Magnesium: 2 mg/dL (ref 1.7–2.4)

## 2021-04-20 LAB — CULTURE, BLOOD (ROUTINE X 2)
Culture: NO GROWTH
Culture: NO GROWTH
Special Requests: ADEQUATE
Special Requests: ADEQUATE

## 2021-04-20 MED ORDER — SODIUM CHLORIDE 0.9 % IV SOLN
2.0000 g | INTRAVENOUS | Status: DC
  Administered 2021-04-21 – 2021-04-22 (×2): 2 g via INTRAVENOUS
  Filled 2021-04-20 (×3): qty 20

## 2021-04-20 MED ORDER — INSULIN DETEMIR 100 UNIT/ML ~~LOC~~ SOLN
30.0000 [IU] | Freq: Every day | SUBCUTANEOUS | Status: DC
  Administered 2021-04-20 – 2021-04-22 (×2): 30 [IU] via SUBCUTANEOUS
  Filled 2021-04-20 (×4): qty 0.3

## 2021-04-20 NOTE — Progress Notes (Signed)
Inpatient Diabetes Program Recommendations  AACE/ADA: New Consensus Statement on Inpatient Glycemic Control (2015)  Target Ranges:  Prepandial:   less than 140 mg/dL      Peak postprandial:   less than 180 mg/dL (1-2 hours)      Critically ill patients:  140 - 180 mg/dL  Results for [name], [name] (MRN [id number]) as of 04/20/2021 09:45  Ref. Range 04/19/2021 08:04 04/19/2021 11:59 04/19/2021 13:09 04/19/2021 15:49 04/19/2021 21:18 04/20/2021 08:36  Glucose-Capillary Latest Ref Range: 70 - 99 mg/dL 112 (H) 126 (H) 123 (H) 183 (H) 298 (H) 226 (H)    Review of Glycemic Control  Current orders for Inpatient glycemic control: Levemir 30 units daily, Novolog 0-15 units TID with meals, Novolog 0-5 units QHS  Inpatient Diabetes Program Recommendations:    Insulin: Noted patient received Levemir 20 units on 04/19/21 and also received Decadron 4 mg x1 on 04/19/21 which is contributing to hyperglycemia. Fasting glucose 226 mg/dl today. Per podiatry note on 04/19/21, "Since the patient does not opt for BKA, recommend an additional irrigation and debridement prior to discharge.  Tentatively planned for 04/21/2021, Thursday". May want to consider decreasing Levemir back down to 20 units daily and ordering Novolog 3 units TID with meals for meal coverage if patient eats at least 50% of meals.  Thanks, Barnie Alderman, RN, MSN, CDE Diabetes Coordinator Inpatient Diabetes Program [phone] (Team Pager from 8am to 5pm)

## 2021-04-20 NOTE — Evaluation (Signed)
Physical Therapy Evaluation Patient Details Name: [name] MRN: [id number] DOB: [birth year] Today's Date: 04/20/2021   History of Present Illness  Pt admitted with R foot wound and pain; wound to R lateral foot.  Pt admitted with cellulitis of R foot, diabetes foot ulcer (R foot with infection), PAD with h/o L metatarsal base amputation, hyperglycemia, htn, AKI, and anemia.  Pt s/p diagnostic R LE angiogram 5/5.  Diabetic ulcer lateral aspect R foot (+) osteomyelitis.  S/p 5th ray R amputation and I&D 5/7.  S/p R foot ulcer wound debridement 5/10.  PMH includes DM, htn, HLD, h/o L partial 5th metatarsal base resection July 2021.  Clinical Impression  Prior to hospital admission, pt was independent with ambulation; lives with his wife in 1 level home with 5-6 STE B railings.  Currently pt is independent with bed mobility; modified independent with transfers using RW; and CGA ambulating 30 feet and then 20 feet with RW use.  Pt preferring to be R heel WB'ing while ambulating (in R LE boot) to protect recent surgery; pt steady ambulating with RW; limited distance per pt preference.  Pt reporting 5/10 R foot pain end of session (nurse notified of pt's request for pain meds).  Pt would benefit from skilled PT to address noted impairments and functional limitations (see below for any additional details).  Upon hospital discharge, no further PT needs anticipated.    Follow Up Recommendations No PT follow up    Equipment Recommendations  Rolling walker with 5" wheels;3in1 (PT)    Recommendations for Other Services       Precautions / Restrictions Precautions Precaution Comments: Elevate operative extremity on 2 pillows at all times Restrictions Weight Bearing Restrictions: Yes RLE Weight Bearing: Weight bearing as tolerated Other Position/Activity Restrictions: WBAT in Cam boot with assistance of walker      Mobility  Bed Mobility Overal bed mobility: Independent             General  bed mobility comments: no difficulties noted    Transfers Overall transfer level: Modified independent Equipment used: Rolling walker (2 wheeled)             General transfer comment: mild increased effort to stand up to walker; steady  Ambulation/Gait Ambulation/Gait assistance: Min guard Gait Distance (Feet):  (30 feet; 20 feet) Assistive device: Rolling walker (2 wheeled) Gait Pattern/deviations: Step-to pattern Gait velocity: decreased   General Gait Details: pt heel WB'ing through R LE in order to protect R foot amputation; steady with RW use  Stairs            Wheelchair Mobility    Modified Rankin (Stroke Patients Only)       Balance Overall balance assessment: Needs assistance Sitting-balance support: No upper extremity supported;Feet supported Sitting balance-Leahy Scale: Normal Sitting balance - Comments: steady sitting reaching outside BOS   Standing balance support: Single extremity supported Standing balance-Leahy Scale: Fair Standing balance comment: steady static standing with single UE support                             Pertinent Vitals/Pain Pain Assessment: 0-10 Pain Score: 5  Pain Location: R foot Pain Descriptors / Indicators: Aching;Tender Pain Intervention(s): Limited activity within patient's tolerance;Monitored during session;Repositioned;Patient requesting pain meds-RN notified;Other (comment) (R LE elevated in bed)  Vitals (HR and O2 on room air) stable and WFL throughout treatment session.    Home Living Family/patient expects to be discharged to::  Private residence Living Arrangements: Spouse/significant other Available Help at Discharge: Family;Available PRN/intermittently Type of Home: House Home Access: Stairs to enter Entrance Stairs-Rails: Right;Left;Can reach both Entrance Stairs-Number of Steps: 5-6 Home Layout: One level Home Equipment: None      Prior Function Level of Independence: Independent          Comments: No falls in past 6 months.     Hand Dominance        Extremity/Trunk Assessment   Upper Extremity Assessment Upper Extremity Assessment: Overall WFL for tasks assessed    Lower Extremity Assessment Lower Extremity Assessment: RLE deficits/detail (L LE WFL) RLE Deficits / Details: at least 3/5 AROM hip flexion, knee flexion/extension, and DF/PF    Cervical / Trunk Assessment Cervical / Trunk Assessment: Normal  Communication   Communication: No difficulties  Cognition Arousal/Alertness: Awake/alert Behavior During Therapy: WFL for tasks assessed/performed Overall Cognitive Status: Within Functional Limits for tasks assessed                                        General Comments General comments (skin integrity, edema, etc.): R foot dressing in place (no drainage noted beginning/end of session)  Nursing called therapist that pt's cam boot was in pt's room; nursing cleared pt for participation in physical therapy.  Pt agreeable to PT session.    Exercises     Assessment/Plan    PT Assessment Patient needs continued PT services  PT Problem List Decreased strength;Decreased activity tolerance;Decreased balance;Decreased mobility;Decreased knowledge of use of DME;Decreased knowledge of precautions;Pain       PT Treatment Interventions DME instruction;Gait training;Stair training;Functional mobility training;Therapeutic activities;Therapeutic exercise;Balance training;Patient/family education    PT Goals (Current goals can be found in the Care Plan section)  Acute Rehab PT Goals Patient Stated Goal: to go home PT Goal Formulation: With patient Time For Goal Achievement: 05/04/21 Potential to Achieve Goals: Good    Frequency Min 2X/week   Barriers to discharge        Co-evaluation               AM-PAC PT "6 Clicks" Mobility  Outcome Measure Help needed turning from your back to your side while in a flat bed without using  bedrails?: None Help needed moving from lying on your back to sitting on the side of a flat bed without using bedrails?: None Help needed moving to and from a bed to a chair (including a wheelchair)?: A Little Help needed standing up from a chair using your arms (e.g., wheelchair or bedside chair)?: A Little Help needed to walk in hospital room?: A Little Help needed climbing 3-5 steps with a railing? : A Little 6 Click Score: 20    End of Session Equipment Utilized During Treatment: Gait belt Activity Tolerance: Patient tolerated treatment well Patient left: in bed;with call bell/phone within reach;Other (comment) (R LE elevated in bed) Nurse Communication: Mobility status;Precautions;Patient requests pain meds;Weight bearing status;Other (comment) (Cam boot) PT Visit Diagnosis: Other abnormalities of gait and mobility (R26.89);Muscle weakness (generalized) (M62.81);Pain Pain - Right/Left: Right Pain - part of body: Ankle and joints of foot    Time: 4098-1191 PT Time Calculation (min) (ACUTE ONLY): 33 min   Charges:   PT Evaluation $PT Eval Low Complexity: 1 Low PT Treatments $Gait Training: 8-22 mins       Leitha Bleak, PT 04/20/21, 1:38 PM

## 2021-04-20 NOTE — TOC Progression Note (Signed)
Transition of Care [facility]) - Progression Note    Patient Details  Name: [name] MRN: [id number] Date of Birth: [birth year]  Transition of [facility]) CM/SW [place], RN Phone Number: 04/20/2021, 3:20 PM  Clinical Narrative:   [facility] with [facility] for nursing to manage the home ABX         Expected Discharge Plan and Services                                                 Social Determinants of Health (SDOH) Interventions    Readmission Risk Interventions No flowsheet data found.

## 2021-04-20 NOTE — Progress Notes (Signed)
PICC order received. Primary RN aware PICC will not be placed today.

## 2021-04-20 NOTE — Progress Notes (Signed)
   PODIATRY PROGRESS NOTE  NAME [name] MRN [id number] DOB 1966/05/26 DOA 04/12/2021   Reason for consult: OM RT FOOT Chief Complaint  Patient presents with  . Foot Pain     ASSESSMENT/PLAN OF CARE S/p 5th ray amputation with incision and drainage RT foot.  -Followed up with patient this evening after surgery.  Return to the OR with irrigation and debridement performed today at noon.  - Explained to the patient the extent of the infection and the poor prognosis for the foot and healing in general.  Explained that this was a very aggressive infection with heavy amounts of necrotic debris and purulence throughout all compartments of the foot.  Had a very honest conversation regarding the option for BKA vs. limb salvage.  The patient does not want to pursue BKA.  I explained that limb salvage will be a long road to recovery up to a year or longer given the extent of infection and there are no guarantees.  Patient understands. - Since the patient does not opt for BKA, recommend an additional irrigation and debridement prior to discharge.  Tentatively planned for 04/21/2021, Thursday. - Recommend IV PICC line and ID consult for antibiotic management -Weightbearing as tolerated in the cam boot with the assistance of a walker.  Cam boot orders placed  -We will continue to follow while inpatient  Please contact me directly with any questions or concerns.  Cell [phone]   Edrick Kins, DPM [facility]  Dr. Edrick Kins, DPM    [address], [place] [address]                Office [phone]  Fax [phone]

## 2021-04-20 NOTE — Progress Notes (Signed)
Patient ID: [name], male   DOB: 1966/01/14, 55 y.o.   MRN: [id number]  PROGRESS NOTE    [name]  [id number] DOB: 07-03-1966 DOA: 04/12/2021 PCP: [name], MD   Brief Narrative:   55 y.o. male with medical history significant for DM type II, HTN, PAD with history of left partial fifth metatarsal base resection July 2021 after presenting with gangrene presented with worsening right foot pain and worsening foot ulcer despite undergoing excisional debridement on 04/05/2021 by podiatry and oral doxycycline.  On presentation, he had WBCs of 26,000 with creatinine of 1.38, up from 1.02 which is his baseline.  X-ray of the right foot showed soft tissue ulceration of lateral aspect of midfoot with no evidence of underlying osteomyelitis with diffuse soft tissue edema.  He was started on broad-spectrum antibiotics.  Podiatry and vascular surgery were consulted.  He underwent aortogram and right lower extremity angiogram on 04/14/2021.  He underwent fifth ray amputation of right foot and I&D of right foot on 04/16/2021 by podiatry.  ID was consulted on 04/18/2021.  Assessment & Plan:   Cellulitis of right foot/diabetic right foot ulcer infection with osteomyelitis PAD with history of left metatarsal base amputation Leukocytosis -Patient presented with worsening right foot infected ulcer despite undergoing wound debridement on 04/05/2021 and outpatient doxycycline   -MRI of the right foot was consistent with osteomyelitis.  -Status post aortogram and right lower extremity angiogram done by vascular surgery on 04/14/2021.  Vascular surgery has signed off.  Blood cultures negative so far -Continue rosuvastatin for PAD.  Not on antiplatelets -WBCs improving.  -Status post fifth ray amputation of right foot and I&D of right foot on 04/16/2021 by podiatry.  OR cultures growing rare strep agalactiae.  Status post another debridement of the wound on 04/19/2021 and next is planned on 04/21/2021.  Blood  culture remain negative.  Wound care as per podiatry recommendations.  ID on board.  Vancomycin discontinued since there was no evidence of MRSA.  Patient on Rocephin and Flagyl.  ID recommends at least 2 to 6 weeks of IV antibiotics however they will recommend final plans.  Diabetes mellitus type II with hyperglycemia: Blood sugar slightly elevated however I will continue current Levemir and SSI as he is scheduled for another debridement tomorrow and may need to be n.p.o. from midnight.  Hypertension -Monitor blood pressure.  Continue irbesartan/hydrochlorothiazide/nebivolol  Acute kidney injury -Presented with creatinine of 1.38: Baseline of around 1 to 2 months ago.  Treated with IV fluids.  Resolved.  Anemia -Unknown cause.  Hemoglobin dropped to 7.3.  Still no indication of transfusion.  Check in the morning.  Hypokalemia: Resolved  Thrombocytosis -Possibly reactive.  Monitor  DVT prophylaxis: SCDs Code Status: Full Family Communication: None present at bedside. Disposition Plan: Status is: Inpatient  Remains inpatient appropriate because:Inpatient level of care appropriate due to severity of illness   Dispo: The patient is from: Home              Anticipated d/c is to: Home              Patient currently is not medically stable to d/c.   Difficult to place patient No  Consultants: Podiatry/vascular surgery/ID  Procedures:   aortogram and right lower extremity angiogram done by vascular surgery on 04/14/2021 fifth ray amputation of right foot and I&D of right foot on 04/16/2021   Antimicrobials: Rocephin and vancomycin from 04/12/2021 onwards.  Flagyl from 04/13/2021 onwards   Subjective: Patient seen  and examined.  He has no complaints.  During the whole encounter, patient did not take his eyes off from the TV.  Would not make face-to-face contact despite of my request.  Would intentionally not answer the question until asked twice.  Wants to go home.  But understands that  podiatry wants him to stay in the hospital.  Objective: Vitals:   04/20/21 0106 04/20/21 0602 04/20/21 0736 04/20/21 1156  BP: 110/64 119/75 131/73 124/80  Pulse: 64 63 62 65  Resp: 18 16 18 18   Temp: (!) 97.4 F (36.3 C) 97.8 F (36.6 C) 98 F (36.7 C) 98 F (36.7 C)  TempSrc: Oral Oral    SpO2: 100% 100% 99% 99%  Weight:      Height:        Intake/Output Summary (Last 24 hours) at 04/20/2021 1400 Last data filed at 04/20/2021 1135 Gross per 24 hour  Intake 227.48 ml  Output 800 ml  Net -572.52 ml   Filed Weights   04/13/21 0116 04/14/21 1640 04/19/21 1146  Weight: 90.8 kg 90.8 kg 90.8 kg    Examination:  General exam: Appears calm and comfortable  Respiratory system: Clear to auscultation. Respiratory effort normal. Cardiovascular system: S1 & S2 heard, RRR. No JVD, murmurs, rubs, gallops or clicks. No pedal edema. Gastrointestinal system: Abdomen is nondistended, soft and nontender. No organomegaly or masses felt. Normal bowel sounds heard. Central nervous system: Alert and oriented. No focal neurological deficits. Extremities: Symmetric 5 x 5 power. Skin: Right foot dressing.  Direct visualization deferred.  Data Reviewed: I have personally reviewed following labs and imaging studies  CBC: Recent Labs  Lab 04/16/21 0530 04/17/21 0535 04/18/21 0528 04/19/21 0312 04/20/21 0607  WBC 24.3* 27.8* 21.5* 17.9* 16.3*  NEUTROABS 20.7* 24.5* 17.8* 14.2* 13.6*  HGB 9.0* 8.2* 9.1* 7.5* 7.3*  HCT 26.5* 25.3* 27.7* 22.4* 23.7*  MCV 91.4 94.8 93.9 91.4 95.6  PLT 395 397 538* 471* 626*   Basic Metabolic Panel: Recent Labs  Lab 04/16/21 0530 04/17/21 0535 04/18/21 0528 04/19/21 0312 04/20/21 0607  NA 136 135 134* 135 136  K 3.3* 4.1 3.5 3.2* 3.8  CL 107 107 105 108 108  CO2 21* 18* 20* 21* 21*  GLUCOSE 141* 251* 226* 136* 236*  BUN 30* 32* 40* 29* 23*  CREATININE 0.93 0.99 1.07 0.91 0.88  CALCIUM 8.5* 8.4* 8.5* 8.4* 8.1*  MG 1.9 2.0 2.0 1.8 2.0    GFR: Estimated Creatinine Clearance: 105.3 mL/min (by C-G formula based on SCr of 0.88 mg/dL). Liver Function Tests: No results for input(s): AST, ALT, ALKPHOS, BILITOT, PROT, ALBUMIN in the last 168 hours. No results for input(s): LIPASE, AMYLASE in the last 168 hours. No results for input(s): AMMONIA in the last 168 hours. Coagulation Profile: No results for input(s): INR, PROTIME in the last 168 hours. Cardiac Enzymes: No results for input(s): CKTOTAL, CKMB, CKMBINDEX, TROPONINI in the last 168 hours. BNP (last 3 results) No results for input(s): PROBNP in the last 8760 hours. HbA1C: No results for input(s): HGBA1C in the last 72 hours. CBG: Recent Labs  Lab 04/19/21 1309 04/19/21 1549 04/19/21 2118 04/20/21 0836 04/20/21 1305  GLUCAP 123* 183* 298* 226* 176*   Lipid Profile: No results for input(s): CHOL, HDL, LDLCALC, TRIG, CHOLHDL, LDLDIRECT in the last 72 hours. Thyroid Function Tests: No results for input(s): TSH, T4TOTAL, FREET4, T3FREE, THYROIDAB in the last 72 hours. Anemia Panel: No results for input(s): VITAMINB12, FOLATE, FERRITIN, TIBC, IRON, RETICCTPCT in the last  72 hours. Sepsis Labs: No results for input(s): PROCALCITON, LATICACIDVEN in the last 168 hours.  Recent Results (from the past 240 hour(s))  SARS CORONAVIRUS 2 (TAT 6-24 HRS) Nasopharyngeal Nasopharyngeal Swab     Status: None   Collection Time: 04/12/21  6:30 PM   Specimen: Nasopharyngeal Swab  Result Value Ref Range Status   SARS Coronavirus 2 NEGATIVE NEGATIVE Final    Comment: (NOTE) SARS-CoV-2 target nucleic acids are NOT DETECTED.  The SARS-CoV-2 RNA is generally detectable in upper and lower respiratory specimens during the acute phase of infection. Negative results do not preclude SARS-CoV-2 infection, do not rule out co-infections with other pathogens, and should not be used as the sole basis for treatment or other patient management decisions. Negative results must be combined  with clinical observations, patient history, and epidemiological information. The expected result is Negative.  Fact Sheet for Patients: SugarRoll.be  Fact Sheet for Healthcare Providers: https://www.woods-mathews.com/  This test is not yet approved or cleared by the [place] FDA and  has been authorized for detection and/or diagnosis of SARS-CoV-2 by FDA under an Emergency Use Authorization (EUA). This EUA will remain  in effect (meaning this test can be used) for the duration of the COVID-19 declaration under Se ction 564(b)(1) of the Act, 21 U.S.C. section 360bbb-3(b)(1), unless the authorization is terminated or revoked sooner.  Performed at [facility], [place] [address] [place], [place] [address]   Aerobic Culture w Gram Stain (superficial specimen)     Status: None   Collection Time: 04/13/21  8:56 AM   Specimen: Wound  Result Value Ref Range Status   Specimen Description WOUND RIGHT FOOT  Final   Special Requests NONE  Final   Gram Stain   Final    FEW WBC PRESENT,BOTH PMN AND MONONUCLEAR FEW GRAM POSITIVE COCCI RARE GRAM VARIABLE ROD    Culture   Final    RARE STREPTOCOCCUS AGALACTIAE TESTING AGAINST S. AGALACTIAE NOT ROUTINELY PERFORMED DUE TO PREDICTABILITY OF AMP/PEN/VAN SUSCEPTIBILITY. WITIHIN MIXED ORGANISMS Performed at [facility], [place] [address], [place], [place] [address]    Report Status 04/17/2021 FINAL  Final  CULTURE, BLOOD (ROUTINE X 2) w Reflex to ID Panel     Status: None   Collection Time: 04/14/21  8:27 AM   Specimen: BLOOD  Result Value Ref Range Status   Specimen Description BLOOD LEFT HAND  Final   Special Requests   Final    BOTTLES DRAWN AEROBIC AND ANAEROBIC Blood Culture adequate volume   Culture   Final    NO GROWTH 6 DAYS Performed at [facility], [address] [place], [place] [address]    Report Status 04/20/2021 FINAL  Final  CULTURE, BLOOD (ROUTINE X 2) w  Reflex to ID Panel     Status: None   Collection Time: 04/14/21  8:28 AM   Specimen: BLOOD  Result Value Ref Range Status   Specimen Description BLOOD  RIGHT HAND  Final   Special Requests   Final    BOTTLES DRAWN AEROBIC AND ANAEROBIC Blood Culture adequate volume   Culture   Final    NO GROWTH 6 DAYS Performed at [facility], [address] [place], [place] [address]    Report Status 04/20/2021 FINAL  Final  Aerobic/Anaerobic Culture w Gram Stain (surgical/deep wound)     Status: None (Preliminary result)   Collection Time: 04/16/21  1:47 PM   Specimen: PATH Other; Abscess  Result Value Ref  Range Status   Specimen Description ABSCESS RIGHT FOOT  Final   Special Requests SWAB SPEC A  Final   Gram Stain   Final    ABUNDANT WBC PRESENT, PREDOMINANTLY PMN RARE GRAM POSITIVE COCCI IN PAIRS    Culture   Final    NO GROWTH 4 DAYS NO ANAEROBES ISOLATED; CULTURE IN PROGRESS FOR 5 DAYS Performed at [facility], [place] [address], [place],  [address]    Report Status PENDING  Incomplete         Radiology Studies: No results found.      Scheduled Meds: . gentamicin ointment   Topical BID  . irbesartan  300 mg Oral Daily   And  . hydrochlorothiazide  25 mg Oral Daily  . insulin aspart  0-15 Units Subcutaneous TID WC  . insulin aspart  0-5 Units Subcutaneous QHS  . insulin detemir  30 Units Subcutaneous Daily  . nebivolol  5 mg Oral Daily  . rosuvastatin  40 mg Oral Daily   Continuous Infusions: . sodium chloride Stopped (04/19/21 1641)  . cefTRIAXone (ROCEPHIN)  IV Stopped (04/20/21 0401)  . metronidazole 500 mg (04/20/21 3491)          Darliss Cheney, MD [facility] 04/20/2021, 2:00 PM

## 2021-04-20 NOTE — H&P (View-Only) (Signed)
   PODIATRY PROGRESS NOTE  NAME [name] MRN [id number] DOB 08/02/1966 DOA 04/12/2021   Reason for consult: OM RT FOOT Chief Complaint  Patient presents with  . Foot Pain     ASSESSMENT/PLAN OF CARE S/p 5th ray amputation with incision and drainage RT foot.  -Followed up with patient this evening after surgery.  Return to the OR with irrigation and debridement performed today at noon.  - Explained to the patient the extent of the infection and the poor prognosis for the foot and healing in general.  Explained that this was a very aggressive infection with heavy amounts of necrotic debris and purulence throughout all compartments of the foot.  Had a very honest conversation regarding the option for BKA vs. limb salvage.  The patient does not want to pursue BKA.  I explained that limb salvage will be a long road to recovery up to a year or longer given the extent of infection and there are no guarantees.  Patient understands. - Since the patient does not opt for BKA, recommend an additional irrigation and debridement prior to discharge.  Tentatively planned for 04/21/2021, Thursday. - Recommend IV PICC line and ID consult for antibiotic management -Weightbearing as tolerated in the cam boot with the assistance of a walker.  Cam boot orders placed  -We will continue to follow while inpatient  Please contact me directly with any questions or concerns.  Cell [phone]   [name] M. [name], DPM [facility]  Dr. Alexsandro Salek M. Jelesa Mangini, DPM    [address]                                        [place], [place] [address]                Office [phone]  Fax [phone]     

## 2021-04-20 NOTE — Progress Notes (Signed)
[facility] INFECTIOUS DISEASE PROGRESS NOTE Date of Admission:  04/12/2021     ID: LOGUN COLAVITO is a 55 y.o. male with  Diabetic foot infection Principal Problem:   Cellulitis of right foot Active Problems:   Hypertension   Hyperglycemia due to type 2 diabetes mellitus ([place])   Diabetic ulcer of foot with fat layer exposed ([place])   PAD (peripheral artery disease) ([place])   AKI (acute kidney injury) ([place])   Anemia   Subjective: No fevers, wbc down more to 16. Remains on ceftriaxone and flagyl.  ROS  Eleven systems are reviewed and negative except per hpi  Medications:  Antibiotics Given (last 72 hours)    Date/Time Action Medication Dose Rate   04/17/21 1453 New Bag/Given   metroNIDAZOLE (FLAGYL) IVPB 500 mg 500 mg 100 mL/hr   04/17/21 2211 New Bag/Given   metroNIDAZOLE (FLAGYL) IVPB 500 mg 500 mg 100 mL/hr   04/17/21 2327 New Bag/Given   vancomycin (VANCOREADY) IVPB 1250 mg/250 mL 1,250 mg 166.7 mL/hr   04/18/21 0351 New Bag/Given   cefTRIAXone (ROCEPHIN) 1 g in sodium chloride 0.9 % 100 mL IVPB 1 g 200 mL/hr   04/18/21 0728 New Bag/Given   metroNIDAZOLE (FLAGYL) IVPB 500 mg 500 mg 100 mL/hr   04/18/21 1531 New Bag/Given   metroNIDAZOLE (FLAGYL) IVPB 500 mg 500 mg 100 mL/hr   04/18/21 2146 New Bag/Given   vancomycin (VANCOREADY) IVPB 1500 mg/300 mL 1,500 mg 150 mL/hr   04/18/21 2332 New Bag/Given   metroNIDAZOLE (FLAGYL) IVPB 500 mg 500 mg 100 mL/hr   04/19/21 0427 New Bag/Given   cefTRIAXone (ROCEPHIN) 1 g in sodium chloride 0.9 % 100 mL IVPB 1 g 200 mL/hr   04/19/21 0653 New Bag/Given   metroNIDAZOLE (FLAGYL) IVPB 500 mg 500 mg 100 mL/hr   04/19/21 1534 New Bag/Given   metroNIDAZOLE (FLAGYL) IVPB 500 mg 500 mg 100 mL/hr   04/19/21 2230 New Bag/Given   metroNIDAZOLE (FLAGYL) IVPB 500 mg 500 mg 100 mL/hr   04/20/21 0331 New Bag/Given   cefTRIAXone (ROCEPHIN) 1 g in sodium chloride 0.9 % 100 mL IVPB 1 g 200 mL/hr   04/20/21 7846 New Bag/Given   metroNIDAZOLE (FLAGYL)  IVPB 500 mg 500 mg 100 mL/hr     . gentamicin ointment   Topical BID  . irbesartan  300 mg Oral Daily   And  . hydrochlorothiazide  25 mg Oral Daily  . insulin aspart  0-15 Units Subcutaneous TID WC  . insulin aspart  0-5 Units Subcutaneous QHS  . insulin detemir  30 Units Subcutaneous Daily  . nebivolol  5 mg Oral Daily  . rosuvastatin  40 mg Oral Daily    Objective: Vital signs in last 24 hours: Temp:  [97.4 F (36.3 C)-98.2 F (36.8 C)] 98 F (36.7 C) (05/11 1156) Pulse Rate:  [62-76] 65 (05/11 1156) Resp:  [16-20] 18 (05/11 1156) BP: (110-145)/(64-85) 124/80 (05/11 1156) SpO2:  [98 %-100 %] 99 % (05/11 1156) Constitutional: He is oriented to person, place, and time. He appears well-developed and well-nourished. No distress.  HENT:  Mouth/Throat: Oropharynx is clear and moist. No oropharyngeal exudate.  Cardiovascular: Normal rate, regular rhythm and normal heart sounds. Exam reveals no gallop and no friction rub.  No murmur heard.  Pulmonary/Chest: Effort normal and breath sounds normal. No respiratory distress. He has no wheezes.  Abdominal: Soft. Bowel sounds are normal. He exhibits no distension. There is no tenderness.  Lymphadenopathy:  He has no cervical adenopathy.  Neurological: He is alert and oriented to person, place, and time.  Skin: Foot wrapped and pt refuses exam due to pain Psychiatric: He has a normal mood and affect. His behavior is normal.    Lab Results Recent Labs    04/19/21 0312 04/20/21 0607  WBC 17.9* 16.3*  HGB 7.5* 7.3*  HCT 22.4* 23.7*  NA 135 136  K 3.2* 3.8  CL 108 108  CO2 21* 21*  BUN 29* 23*  CREATININE 0.91 0.88    Microbiology: Results for orders placed or performed during the hospital encounter of 04/12/21  SARS CORONAVIRUS 2 (TAT 6-24 HRS) Nasopharyngeal Nasopharyngeal Swab     Status: None   Collection Time: 04/12/21  6:30 PM   Specimen: Nasopharyngeal Swab  Result Value Ref Range Status   SARS Coronavirus 2  NEGATIVE NEGATIVE Final    Comment: (NOTE) SARS-CoV-2 target nucleic acids are NOT DETECTED.  The SARS-CoV-2 RNA is generally detectable in upper and lower respiratory specimens during the acute phase of infection. Negative results do not preclude SARS-CoV-2 infection, do not rule out co-infections with other pathogens, and should not be used as the sole basis for treatment or other patient management decisions. Negative results must be combined with clinical observations, patient history, and epidemiological information. The expected result is Negative.  Fact Sheet for Patients: SugarRoll.be  Fact Sheet for Healthcare Providers: https://www.woods-mathews.com/  This test is not yet approved or cleared by the [place] FDA and  has been authorized for detection and/or diagnosis of SARS-CoV-2 by FDA under an Emergency Use Authorization (EUA). This EUA will remain  in effect (meaning this test can be used) for the duration of the COVID-19 declaration under Se ction 564(b)(1) of the Act, 21 U.S.C. section 360bbb-3(b)(1), unless the authorization is terminated or revoked sooner.  Performed at [facility], [place] [address] [place], [place] [address]   Aerobic Culture w Gram Stain (superficial specimen)     Status: None   Collection Time: 04/13/21  8:56 AM   Specimen: Wound  Result Value Ref Range Status   Specimen Description WOUND RIGHT FOOT  Final   Special Requests NONE  Final   Gram Stain   Final    FEW WBC PRESENT,BOTH PMN AND MONONUCLEAR FEW GRAM POSITIVE COCCI RARE GRAM VARIABLE ROD    Culture   Final    RARE STREPTOCOCCUS AGALACTIAE TESTING AGAINST S. AGALACTIAE NOT ROUTINELY PERFORMED DUE TO PREDICTABILITY OF AMP/PEN/VAN SUSCEPTIBILITY. WITIHIN MIXED ORGANISMS Performed at [facility], [place] [address] [place], [place] [address]    Report Status 04/17/2021 FINAL  Final  CULTURE, BLOOD (ROUTINE X 2) w Reflex to  ID Panel     Status: None   Collection Time: 04/14/21  8:27 AM   Specimen: BLOOD  Result Value Ref Range Status   Specimen Description BLOOD LEFT HAND  Final   Special Requests   Final    BOTTLES DRAWN AEROBIC AND ANAEROBIC Blood Culture adequate volume   Culture   Final    NO GROWTH 6 DAYS Performed at [facility], [address] [place], [place] [address]    Report Status 04/20/2021 FINAL  Final  CULTURE, BLOOD (ROUTINE X 2) w Reflex to ID Panel     Status: None   Collection Time: 04/14/21  8:28 AM   Specimen: BLOOD  Result Value Ref Range Status   Specimen Description BLOOD  RIGHT HAND  Final   Special Requests   Final  BOTTLES DRAWN AEROBIC AND ANAEROBIC Blood Culture adequate volume   Culture   Final    NO GROWTH 6 DAYS Performed at [facility], [address] [place], [place] [address]    Report Status 04/20/2021 FINAL  Final  Aerobic/Anaerobic Culture w Gram Stain (surgical/deep wound)     Status: None (Preliminary result)   Collection Time: 04/16/21  1:47 PM   Specimen: PATH Other; Abscess  Result Value Ref Range Status   Specimen Description ABSCESS RIGHT FOOT  Final   Special Requests SWAB SPEC A  Final   Gram Stain   Final    ABUNDANT WBC PRESENT, PREDOMINANTLY PMN RARE GRAM POSITIVE COCCI IN PAIRS    Culture   Final    NO GROWTH 4 DAYS NO ANAEROBES ISOLATED; CULTURE IN PROGRESS FOR 5 DAYS Performed at [facility], [place] [address] [place], [place] [address]    Report Status PENDING  Incomplete    Studies/Results: No results found.  Assessment/Plan: LEOPOLDO MAZZIE is a 55 y.o. male with diabetic foot osteomyelitis of the right foot in a patient with poorly controlled diabetes with an A1c of 9.9.  He has undergone MRI which showed osteomyelitis of the right fifth metatarsal.  He is now status post amputation of the right fifth metatarsal with extensive findings of purulence.  He would likely need repeat surgery.  He has had an  angiogram which was negative for any stenosis.  He has been on initially cefepime and metronidazole and vancomycin.  The cefepime was recently changed to ceftriaxone.  His white count remains elevated at 21.  He did initially have fevers but those seem to be improving. Cxs mixed but with strep agalactae.  Of note he was on doxycycline at the time of admission so cultures may be affected. 5/10- repeat debridement done today. WBC down to 17. Cxs with grp B strep within mixed organisms. No  MRSA, NO psuedomonas 5/11- extensive continued infection and necrotic tissue found at surgery yesterday. Poor prognosis for limb [place] but pt declines BKA. Stopped vanco 5/10  as no MRSA found  Recommendations Path is pending Planned for repeat I and D tomorrow. Given extent of infection and findings at surgery 5/10 will need min 6 weeks IV abx    Will plan of IV ceftriaxone (increase to 2 gm from 1 gm q 24) and oral flagyl  Thank you very much for the consult. Will follow with you.  Leonel Ramsay   04/20/2021, 2:26 PM

## 2021-04-21 ENCOUNTER — Encounter
Admission: EM | Disposition: A | Discharge status: Home Health | Payer: Self-pay | Source: Home / Self Care | Attending: Internal Medicine

## 2021-04-21 ENCOUNTER — Inpatient Hospital Stay: Payer: Managed Care, Other (non HMO) | Admitting: Certified Registered"

## 2021-04-21 DIAGNOSIS — I96 Gangrene, not elsewhere classified: Secondary | ICD-10-CM

## 2021-04-21 DIAGNOSIS — L03115 Cellulitis of right lower limb: Secondary | ICD-10-CM

## 2021-04-21 HISTORY — PX: APPLICATION OF WOUND VAC: SHX5189

## 2021-04-21 HISTORY — PX: IRRIGATION AND DEBRIDEMENT FOOT: SHX6602

## 2021-04-21 LAB — CBC WITH DIFFERENTIAL/PLATELET
Abs Immature Granulocytes: 0.33 10*3/uL — ABNORMAL HIGH (ref 0.00–0.07)
Basophils Absolute: 0 10*3/uL (ref 0.0–0.1)
Basophils Relative: 0 %
Eosinophils Absolute: 0 10*3/uL (ref 0.0–0.5)
Eosinophils Relative: 0 %
HCT: 23.5 % — ABNORMAL LOW (ref 39.0–52.0)
Hemoglobin: 7.6 g/dL — ABNORMAL LOW (ref 13.0–17.0)
Immature Granulocytes: 2 %
Lymphocytes Relative: 10 %
Lymphs Abs: 1.5 10*3/uL (ref 0.7–4.0)
MCH: 30.6 pg (ref 26.0–34.0)
MCHC: 32.3 g/dL (ref 30.0–36.0)
MCV: 94.8 fL (ref 80.0–100.0)
Monocytes Absolute: 1.4 10*3/uL — ABNORMAL HIGH (ref 0.1–1.0)
Monocytes Relative: 9 %
Neutro Abs: 12.4 10*3/uL — ABNORMAL HIGH (ref 1.7–7.7)
Neutrophils Relative %: 79 %
Platelets: 464 10*3/uL — ABNORMAL HIGH (ref 150–400)
RBC: 2.48 MIL/uL — ABNORMAL LOW (ref 4.22–5.81)
RDW: 12.5 % (ref 11.5–15.5)
WBC: 15.8 10*3/uL — ABNORMAL HIGH (ref 4.0–10.5)
nRBC: 0 % (ref 0.0–0.2)

## 2021-04-21 LAB — GLUCOSE, CAPILLARY
Glucose-Capillary: 78 mg/dL (ref 70–99)
Glucose-Capillary: 79 mg/dL (ref 70–99)
Glucose-Capillary: 85 mg/dL (ref 70–99)
Glucose-Capillary: 95 mg/dL (ref 70–99)
Glucose-Capillary: 167 mg/dL — ABNORMAL HIGH (ref 70–99)

## 2021-04-21 LAB — BASIC METABOLIC PANEL
Anion gap: 9 (ref 5–15)
BUN: 17 mg/dL (ref 6–20)
CO2: 20 mmol/L — ABNORMAL LOW (ref 22–32)
Calcium: 8.2 mg/dL — ABNORMAL LOW (ref 8.9–10.3)
Chloride: 108 mmol/L (ref 98–111)
Creatinine, Ser: 0.73 mg/dL (ref 0.61–1.24)
GFR, Estimated: >60 mL/min (ref >60)
Glucose, Bld: 65 mg/dL — ABNORMAL LOW (ref 70–99)
Potassium: 3.6 mmol/L (ref 3.5–5.1)
Sodium: 137 mmol/L (ref 135–145)

## 2021-04-21 LAB — AEROBIC/ANAEROBIC CULTURE W GRAM STAIN (SURGICAL/DEEP WOUND): Culture: NO GROWTH

## 2021-04-21 SURGERY — IRRIGATION AND DEBRIDEMENT FOOT
Anesthesia: General | Site: Foot | Laterality: Right

## 2021-04-21 MED ORDER — LIDOCAINE HCL (PF) 1 % IJ SOLN
INTRAMUSCULAR | Status: AC
  Filled 2021-04-21: qty 30

## 2021-04-21 MED ORDER — MIDAZOLAM HCL 2 MG/2ML IJ SOLN
INTRAMUSCULAR | Status: DC | PRN
  Administered 2021-04-21 (×2): 1 mg via INTRAVENOUS

## 2021-04-21 MED ORDER — LIDOCAINE HCL (CARDIAC) PF 100 MG/5ML IV SOSY
PREFILLED_SYRINGE | INTRAVENOUS | Status: DC | PRN
  Administered 2021-04-21: 80 mg via INTRAVENOUS

## 2021-04-21 MED ORDER — BUPIVACAINE HCL 0.5 % IJ SOLN
INTRAMUSCULAR | Status: DC | PRN
  Administered 2021-04-21: 10 mL

## 2021-04-21 MED ORDER — SODIUM CHLORIDE 0.9% FLUSH
10.0000 mL | Freq: Two times a day (BID) | INTRAVENOUS | Status: DC
  Administered 2021-04-21 – 2021-04-22 (×3): 10 mL

## 2021-04-21 MED ORDER — CHLORHEXIDINE GLUCONATE CLOTH 2 % EX PADS
6.0000 | MEDICATED_PAD | Freq: Every day | CUTANEOUS | Status: DC
  Administered 2021-04-21 – 2021-04-22 (×2): 6 via TOPICAL

## 2021-04-21 MED ORDER — MEPERIDINE HCL 25 MG/ML IJ SOLN: 6.2500 mg | INTRAMUSCULAR | Status: DC | PRN

## 2021-04-21 MED ORDER — FENTANYL CITRATE (PF) 100 MCG/2ML IJ SOLN
25.0000 ug | INTRAMUSCULAR | Status: DC | PRN
  Administered 2021-04-21: 25 ug via INTRAVENOUS

## 2021-04-21 MED ORDER — EPHEDRINE SULFATE 50 MG/ML IJ SOLN
INTRAMUSCULAR | Status: DC | PRN
  Administered 2021-04-21: 10 mg via INTRAVENOUS

## 2021-04-21 MED ORDER — MIDAZOLAM HCL 2 MG/2ML IJ SOLN
INTRAMUSCULAR | Status: AC
  Filled 2021-04-21: qty 2

## 2021-04-21 MED ORDER — ONDANSETRON HCL 4 MG/2ML IJ SOLN
INTRAMUSCULAR | Status: DC | PRN
  Administered 2021-04-21: 4 mg via INTRAVENOUS

## 2021-04-21 MED ORDER — LIDOCAINE HCL 1 % IJ SOLN
INTRAMUSCULAR | Status: DC | PRN
  Administered 2021-04-21: 10 mL

## 2021-04-21 MED ORDER — FENTANYL CITRATE (PF) 100 MCG/2ML IJ SOLN
INTRAMUSCULAR | Status: AC
  Filled 2021-04-21: qty 2

## 2021-04-21 MED ORDER — SODIUM CHLORIDE 0.9% FLUSH: 10.0000 mL | INTRAVENOUS | Status: DC | PRN

## 2021-04-21 MED ORDER — PROPOFOL 10 MG/ML IV BOLUS
INTRAVENOUS | Status: DC | PRN
  Administered 2021-04-21: 120 mg via INTRAVENOUS

## 2021-04-21 MED ORDER — FENTANYL CITRATE (PF) 100 MCG/2ML IJ SOLN
INTRAMUSCULAR | Status: DC | PRN
  Administered 2021-04-21 (×2): 25 ug via INTRAVENOUS

## 2021-04-21 MED ORDER — BUPIVACAINE HCL (PF) 0.5 % IJ SOLN
INTRAMUSCULAR | Status: AC
  Filled 2021-04-21: qty 30

## 2021-04-21 MED ORDER — FENTANYL CITRATE (PF) 100 MCG/2ML IJ SOLN
INTRAMUSCULAR | Status: AC
  Administered 2021-04-21: 25 ug via INTRAVENOUS
  Filled 2021-04-21: qty 2

## 2021-04-21 MED ORDER — PHENYLEPHRINE HCL (PRESSORS) 10 MG/ML IV SOLN
INTRAVENOUS | Status: DC | PRN
  Administered 2021-04-21 (×4): 100 ug via INTRAVENOUS

## 2021-04-21 SURGICAL SUPPLY — 60 items
BNDG COHESIVE 4X5 TAN STRL (GAUZE/BANDAGES/DRESSINGS) ×1 IMPLANT
BNDG COHESIVE 6X5 TAN STRL LF (GAUZE/BANDAGES/DRESSINGS) ×2 IMPLANT
BNDG CONFORM 2 STRL LF (GAUZE/BANDAGES/DRESSINGS) ×1 IMPLANT
BNDG CONFORM 3 STRL LF (GAUZE/BANDAGES/DRESSINGS) ×1 IMPLANT
BNDG ELASTIC 4X5.8 VLCR STR LF (GAUZE/BANDAGES/DRESSINGS) ×2 IMPLANT
BNDG ESMARK 4X12 TAN STRL LF (GAUZE/BANDAGES/DRESSINGS) ×2 IMPLANT
BNDG GAUZE 4.5X4.1 6PLY STRL (MISCELLANEOUS) ×1 IMPLANT
CANISTER SUCT VAC XL PIRAHNA (CANNISTER) ×1 IMPLANT
COVER WAND RF STERILE (DRAPES) ×2 IMPLANT
CUFF TOURN SGL QUICK 18X4 (TOURNIQUET CUFF) ×1 IMPLANT
CUFF TOURN SGL QUICK 24 (TOURNIQUET CUFF)
CUFF TRNQT CYL 24X4X16.5-23 (TOURNIQUET CUFF) IMPLANT
DRAPE FOR WOUND VAC UNIT (DRAPES) ×1 IMPLANT
DRSG ADAPTIC 3X8 NADH LF (GAUZE/BANDAGES/DRESSINGS) ×2 IMPLANT
DURAPREP 26ML APPLICATOR (WOUND CARE) ×1 IMPLANT
ELECT REM PT RETURN 9FT ADLT (ELECTROSURGICAL) ×2
ELECTRODE REM PT RTRN 9FT ADLT (ELECTROSURGICAL) ×1 IMPLANT
GAUZE PACKING 1/4 X5 YD (GAUZE/BANDAGES/DRESSINGS) IMPLANT
GAUZE PACKING IODOFORM 1X5 (PACKING) ×1 IMPLANT
GAUZE SPONGE 4X4 12PLY STRL (GAUZE/BANDAGES/DRESSINGS) ×2 IMPLANT
GLOVE BIOGEL M 8.0 STRL (GLOVE) ×2 IMPLANT
GLOVE SRG 8 PF TXTR STRL LF DI (GLOVE) ×1 IMPLANT
GLOVE SURG UNDER POLY LF SZ8 (GLOVE) ×1
GOWN STRL REUS W/ TWL XL LVL3 (GOWN DISPOSABLE) ×1 IMPLANT
GOWN STRL REUS W/TWL MED LVL3 (GOWN DISPOSABLE) ×2 IMPLANT
GOWN STRL REUS W/TWL XL LVL3 (GOWN DISPOSABLE) ×1
HANDPIECE VERSAJET DEBRIDEMENT (MISCELLANEOUS) ×1 IMPLANT
HEMOSTAT SURGICEL 2X3 (HEMOSTASIS) ×1 IMPLANT
IV NS 1000ML (IV SOLUTION) ×1
IV NS 1000ML BAXH (IV SOLUTION) ×1 IMPLANT
IV NS IRRIG 3000ML ARTHROMATIC (IV SOLUTION) ×1 IMPLANT
KIT DRSG VAC SLVR GRANUFM (MISCELLANEOUS) ×1 IMPLANT
KIT TURNOVER KIT A (KITS) ×2 IMPLANT
LABEL OR SOLS (LABEL) ×2 IMPLANT
MANIFOLD NEPTUNE II (INSTRUMENTS) ×2 IMPLANT
NDL FILTER BLUNT 18X1 1/2 (NEEDLE) ×1 IMPLANT
NDL HYPO 25X1 1.5 SAFETY (NEEDLE) ×1 IMPLANT
NEEDLE FILTER BLUNT 18X 1/2SAF (NEEDLE) ×1
NEEDLE FILTER BLUNT 18X1 1/2 (NEEDLE) ×1 IMPLANT
NEEDLE HYPO 25X1 1.5 SAFETY (NEEDLE) ×2 IMPLANT
NS IRRIG 500ML POUR BTL (IV SOLUTION) ×2 IMPLANT
PACK EXTREMITY ARMC (MISCELLANEOUS) ×2 IMPLANT
PAD ABD DERMACEA PRESS 5X9 (GAUZE/BANDAGES/DRESSINGS) ×1 IMPLANT
PULSAVAC PLUS IRRIG FAN TIP (DISPOSABLE)
SOL PREP PVP 2OZ (MISCELLANEOUS) ×4
SOLUTION PREP PVP 2OZ (MISCELLANEOUS) ×1 IMPLANT
SPONGE LAP 18X18 RF (DISPOSABLE) ×1 IMPLANT
STAPLER SKIN PROX 35W (STAPLE) IMPLANT
STOCKINETTE IMPERVIOUS 9X36 MD (GAUZE/BANDAGES/DRESSINGS) ×2 IMPLANT
SUT ETHILON 2 0 FS 18 (SUTURE) IMPLANT
SUT ETHILON 4-0 (SUTURE)
SUT ETHILON 4-0 FS2 18XMFL BLK (SUTURE)
SUT PROLENE 3 0 PS 2 (SUTURE) IMPLANT
SUT VIC AB 3-0 SH 27 (SUTURE)
SUT VIC AB 3-0 SH 27X BRD (SUTURE) IMPLANT
SUT VIC AB 4-0 FS2 27 (SUTURE) IMPLANT
SUTURE ETHLN 4-0 FS2 18XMF BLK (SUTURE) IMPLANT
SWAB CULTURE AMIES ANAERIB BLU (MISCELLANEOUS) IMPLANT
SYR 10ML LL (SYRINGE) ×4 IMPLANT
TIP FAN IRRIG PULSAVAC PLUS (DISPOSABLE) IMPLANT

## 2021-04-21 NOTE — Progress Notes (Signed)
Infectious Disease Long Term IV Antibiotic Orders [name] 1966-02-12  Diagnosis:  Diabetic foot severe infection with osteomyelits, s/p 5th MT amputation and debridement x 2 with extensive necrotic tissue  Culture results Results for orders placed or performed during the hospital encounter of 04/12/21  SARS CORONAVIRUS 2 (TAT 6-24 HRS) Nasopharyngeal Nasopharyngeal Swab     Status: None   Collection Time: 04/12/21  6:30 PM   Specimen: Nasopharyngeal Swab  Result Value Ref Range Status   SARS Coronavirus 2 NEGATIVE NEGATIVE Final    Comment: (NOTE) SARS-CoV-2 target nucleic acids are NOT DETECTED.  The SARS-CoV-2 RNA is generally detectable in upper and lower respiratory specimens during the acute phase of infection. Negative results do not preclude SARS-CoV-2 infection, do not rule out co-infections with other pathogens, and should not be used as the sole basis for treatment or other patient management decisions. Negative results must be combined with clinical observations, patient history, and epidemiological information. The expected result is Negative.  Fact Sheet for Patients: SugarRoll.be  Fact Sheet for Healthcare Providers: https://www.woods-mathews.com/  This test is not yet approved or cleared by the [place] FDA and  has been authorized for detection and/or diagnosis of SARS-CoV-2 by FDA under an Emergency Use Authorization (EUA). This EUA will remain  in effect (meaning this test can be used) for the duration of the COVID-19 declaration under Se ction 564(b)(1) of the Act, 21 U.S.C. section 360bbb-3(b)(1), unless the authorization is terminated or revoked sooner.  Performed at [facility], [place] [address], [place], [place] [address]   Aerobic Culture w Gram Stain (superficial specimen)     Status: None   Collection Time: 04/13/21  8:56 AM   Specimen: Wound  Result Value Ref Range Status   Specimen  Description WOUND RIGHT FOOT  Final   Special Requests NONE  Final   Gram Stain   Final    FEW WBC PRESENT,BOTH PMN AND MONONUCLEAR FEW GRAM POSITIVE COCCI RARE GRAM VARIABLE ROD    Culture   Final    RARE STREPTOCOCCUS AGALACTIAE TESTING AGAINST S. AGALACTIAE NOT ROUTINELY PERFORMED DUE TO PREDICTABILITY OF AMP/PEN/VAN SUSCEPTIBILITY. WITIHIN MIXED ORGANISMS Performed at [facility], [place] [address] [place], [place] [address]    Report Status 04/17/2021 FINAL  Final  CULTURE, BLOOD (ROUTINE X 2) w Reflex to ID Panel     Status: None   Collection Time: 04/14/21  8:27 AM   Specimen: BLOOD  Result Value Ref Range Status   Specimen Description BLOOD LEFT HAND  Final   Special Requests   Final    BOTTLES DRAWN AEROBIC AND ANAEROBIC Blood Culture adequate volume   Culture   Final    NO GROWTH 6 DAYS Performed at [facility], [place] [place], [place] [address]    Report Status 04/20/2021 FINAL  Final  CULTURE, BLOOD (ROUTINE X 2) w Reflex to ID Panel     Status: None   Collection Time: 04/14/21  8:28 AM   Specimen: BLOOD  Result Value Ref Range Status   Specimen Description BLOOD  RIGHT HAND  Final   Special Requests   Final    BOTTLES DRAWN AEROBIC AND ANAEROBIC Blood Culture adequate volume   Culture   Final    NO GROWTH 6 DAYS Performed at [facility], [address], [place], [place] [address]    Report Status 04/20/2021 FINAL  Final  Aerobic/Anaerobic Culture w Gram Stain (surgical/deep wound)  Status: None (Preliminary result)   Collection Time: 04/16/21  1:47 PM   Specimen: PATH Other; Abscess  Result Value Ref Range Status   Specimen Description ABSCESS RIGHT FOOT  Final   Special Requests SWAB SPEC A  Final   Gram Stain   Final    ABUNDANT WBC PRESENT, PREDOMINANTLY PMN RARE GRAM POSITIVE COCCI IN PAIRS    Culture   Final    NO GROWTH 4 DAYS NO ANAEROBES ISOLATED; CULTURE IN PROGRESS FOR 5 DAYS Performed at [facility], [place] [address] [place], [place] [address]    Report Status PENDING  Incomplete     LABS Lab Results  Component Value Date   CREATININE 0.73 04/21/2021   Lab Results  Component Value Date   WBC 15.8 (H) 04/21/2021   HGB 7.6 (L) 04/21/2021   HCT 23.5 (L) 04/21/2021   MCV 94.8 04/21/2021   PLT 464 (H) 04/21/2021   Lab Results  Component Value Date   ESRSEDRATE 68 (H) 06/26/2020   Lab Results  Component Value Date   CRP 11.3 (H) 04/20/2021    Allergies: No Known Allergies  Discharge antibiotics Ceftriaxone 2 grams every 24 hours Oral flagyl 500 mg TID   PICC Care per protocol Labs weekly while on IV antibiotics -FAX weekly labs to [id number] CBC w diff   Comprehensive met panel CRP   Planned duration of antibiotics 6 weeks from last surgery   Stop dateJune 23 Follow up clinic date  3 weeks   Leonel Ramsay, MD

## 2021-04-21 NOTE — Anesthesia Procedure Notes (Signed)
Procedure Name: LMA Insertion Date/Time: 04/21/2021 3:03 PM Performed by: Lerry Liner, CRNA Pre-anesthesia Checklist: Patient identified, Emergency Drugs available, Suction available and Patient being monitored Patient Re-evaluated:Patient Re-evaluated prior to induction Oxygen Delivery Method: Circle system utilized Preoxygenation: Pre-oxygenation with 100% oxygen Induction Type: IV induction Ventilation: Mask ventilation without difficulty LMA: LMA inserted LMA Size: 4.5 Number of attempts: 1 Tube secured with: Tape Dental Injury: Teeth and Oropharynx as per pre-operative assessment

## 2021-04-21 NOTE — TOC Progression Note (Addendum)
Transition of Care [facility]) - Progression Note    Patient Details  Name: [name] MRN: [id number] Date of Birth: 1965-12-28  Transition of Care [facility]) CM/SW [place], RN Phone Number: 04/21/2021, 11:13 AM  Clinical Narrative:    Patient will have a Wound vac that will need to be changed twice a week, I called Olivia Mackie with [employer] and arranged for the wound Vac, the order is on the chart for the provider to sign.        Expected Discharge Plan and Services                                                 Social Determinants of Health (SDOH) Interventions    Readmission Risk Interventions No flowsheet data found.

## 2021-04-21 NOTE — Interval H&P Note (Signed)
History and Physical Interval Note:  04/21/2021 1:33 PM  [name]  has presented today for surgery, with the diagnosis of Right Foot Infection.  The various methods of treatment have been discussed with the patient and family. After consideration of risks, benefits and other options for treatment, the patient has consented to  Procedure(s): IRRIGATION AND DEBRIDEMENT FOOT (Right) APPLICATION OF WOUND VAC (Right) as a surgical intervention.  The patient's history has been reviewed, patient examined, no change in status, stable for surgery.  I have reviewed the patient's chart and labs.  Questions were answered to the patient's satisfaction.     [name]

## 2021-04-21 NOTE — Anesthesia Preprocedure Evaluation (Addendum)
Anesthesia Evaluation  Patient identified by MRN, date of birth, ID band Patient awake    Reviewed: Allergy & Precautions, NPO status , Patient's Chart, lab work & pertinent test results, reviewed documented beta blocker date and time   Airway Mallampati: II  TM Distance: >3 FB Neck ROM: Full    Dental no notable dental hx.    Pulmonary neg pulmonary ROS,    Pulmonary exam normal        Cardiovascular hypertension, Pt. on medications and Pt. on home beta blockers + Peripheral Vascular Disease  Normal cardiovascular exam     Neuro/Psych negative neurological ROS  negative psych ROS   GI/Hepatic negative GI ROS, Neg liver ROS,   Endo/Other  diabetes, Poorly Controlled, Type 2  Renal/GU Renal disease     Musculoskeletal negative musculoskeletal ROS (+)   Abdominal Normal abdominal exam  (+)   Peds  Hematology  (+) anemia ,   Anesthesia Other Findings Past Medical History: No date: Diabetes mellitus without complication (HCC) No date: Hyperlipidemia No date: Hypertension   Reproductive/Obstetrics                            Anesthesia Physical  Anesthesia Plan  ASA: III  Anesthesia Plan: General   Post-op Pain Management:    Induction: Intravenous  PONV Risk Score and Plan: 2  Airway Management Planned: LMA  Additional Equipment:   Intra-op Plan:   Post-operative Plan: Extubation in OR  Informed Consent: I have reviewed the patients History and Physical, chart, labs and discussed the procedure including the risks, benefits and alternatives for the proposed anesthesia with the patient or authorized representative who has indicated his/her understanding and acceptance.     Dental advisory given  Plan Discussed with: CRNA, Surgeon and Anesthesiologist  Anesthesia Plan Comments:         Anesthesia Quick Evaluation                                  Anesthesia Evaluation   Patient identified by MRN, date of birth, ID band Patient awake    Reviewed: Allergy & Precautions, H&P , NPO status , Patient's Chart, lab work & pertinent test results, reviewed documented beta blocker date and time   History of Anesthesia Complications Negative for: history of anesthetic complications  Airway Mallampati: I  TM Distance: >3 FB Neck ROM: full    Dental  (+) Dental Advidsory Given, Teeth Intact   Pulmonary neg pulmonary ROS,    Pulmonary exam normal breath sounds clear to auscultation       Cardiovascular Exercise Tolerance: Good hypertension, (-) angina(-) Past MI and (-) Cardiac Stents Normal cardiovascular exam(-) dysrhythmias (-) Valvular Problems/Murmurs Rhythm:regular Rate:Normal     Neuro/Psych negative neurological ROS  negative psych ROS   GI/Hepatic negative GI ROS, Neg liver ROS,   Endo/Other  diabetes  Renal/GU CRFRenal disease  negative genitourinary   Musculoskeletal   Abdominal   Peds  Hematology negative hematology ROS (+)   Anesthesia Other Findings Past Medical History: No date: Diabetes mellitus without complication (HCC) No date: Hyperlipidemia No date: Hypertension   Reproductive/Obstetrics negative OB ROS                            Anesthesia Physical Anesthesia Plan  ASA: III and emergent  Anesthesia Plan: General  Post-op Pain Management:    Induction: Intravenous and Rapid sequence  PONV Risk Score and Plan: 2 and Ondansetron, Dexamethasone, Midazolam and Treatment may vary due to age or medical condition  Airway Management Planned: Oral ETT  Additional Equipment:   Intra-op Plan:   Post-operative Plan: Extubation in OR  Informed Consent: I have reviewed the patients History and Physical, chart, labs and discussed the procedure including the risks, benefits and alternatives for the proposed anesthesia with the patient or authorized representative who has  indicated his/her understanding and acceptance.     Dental Advisory Given  Plan Discussed with: Anesthesiologist, CRNA and Surgeon  Anesthesia Plan Comments: (Patient refused COVID testing, but the case was emergent thus we proceeded as a suspected positive.  Patient will receive a rapid sequence intubation to avoid aerosolization, but no cricoid will be done as we are not trying to prevent aspiration.)      Anesthesia Quick Evaluation

## 2021-04-21 NOTE — Progress Notes (Signed)
PHARMACY CONSULT NOTE FOR:  OUTPATIENT  PARENTERAL ANTIBIOTIC THERAPY (OPAT)  Indication: diabetic foot infection with osteomyelitis Regimen: ceftriaxone 2gm IV q24h End date: 06/02/2021  Also metronidazole 500mg  PO TID until 06/02/2021  IV antibiotic discharge orders are pended. To discharging provider:  please sign these orders via discharge navigator,  Select New Orders & click on the button choice - Manage This Unsigned Work.     Thank you for allowing pharmacy to be a part of this patient's care.  Doreene Eland, PharmD, BCPS.   Work Cell: [phone] 04/21/2021 1:32 PM

## 2021-04-21 NOTE — TOC Progression Note (Addendum)
Transition of Care [facility]) - Progression Note    Patient Details  Name: [name] MRN: [id number] Date of Birth: February 05, 1966  Transition of Care [facility]) CM/SW [place], RN Phone Number: 04/21/2021, 3:35 PM  Clinical Narrative:     The patient will need wound vac changed twice a week, [facility] does not do wound vac, therefore the patient will need [place], I contacted [facility], they are unable to accept the patient, I contacted [facility], they are unable to accept the patient, I contacted [facility] they are unable to accept the patient, I contacted [facility], awaiting a responce [facility] is out of network with [facility]ella Scheuermann, [facility]es is out of network with Christella Scheuermann, Nanine Means is out of network as well   [name] has accepted the patient for nursing    Expected Discharge Plan and Services                                                 Social Determinants of Health (SDOH) Interventions    Readmission Risk Interventions No flowsheet data found.

## 2021-04-21 NOTE — Progress Notes (Signed)
Patient ID: [name], male   DOB: 1966/03/03, 55 y.o.   MRN: [id number]  PROGRESS NOTE    [name]  ION:[id number] DOB: February 27, 1966 DOA: 04/12/2021 PCP: [name], MD   Brief Narrative:   55 y.o. male with medical history significant for DM type II, HTN, PAD with history of left partial fifth metatarsal base resection July 2021 after presenting with gangrene presented with worsening right foot pain and worsening foot ulcer despite undergoing excisional debridement on 04/05/2021 by podiatry and oral doxycycline.  On presentation, he had WBCs of 26,000 with creatinine of 1.38, up from 1.02 which is his baseline.  X-ray of the right foot showed soft tissue ulceration of lateral aspect of midfoot with no evidence of underlying osteomyelitis with diffuse soft tissue edema.  He was started on broad-spectrum antibiotics.  Podiatry and vascular surgery were consulted.  He underwent aortogram and right lower extremity angiogram on 04/14/2021.  He underwent fifth ray amputation of right foot and I&D of right foot on 04/16/2021 by podiatry.  ID was consulted on 04/18/2021.  Assessment & Plan:   Cellulitis of right foot/diabetic right foot ulcer infection with osteomyelitis of right fifth metatarsal PAD with history of left metatarsal base amputation Leukocytosis -Patient presented with worsening right foot infected ulcer despite undergoing wound debridement on 04/05/2021 and outpatient doxycycline   -MRI of the right foot was consistent with osteomyelitis.  -Status post aortogram and right lower extremity angiogram done by vascular surgery on 04/14/2021.  Vascular surgery has signed off.  Blood cultures negative so far -Continue rosuvastatin for PAD.  Not on antiplatelets -WBCs improving.  -Status post fifth ray amputation of right foot and I&D of right foot on 04/16/2021 by podiatry.  OR cultures growing rare strep agalactiae.  Status post another debridement of the wound on 04/19/2021 and next is planned  on 04/21/2021.  Blood culture remain negative.  Wound care as per podiatry recommendations.  ID on board.  Vancomycin discontinued since there was no evidence of MRSA.  Patient on Rocephin and Flagyl.  ID recommends to continue both antibiotics till 06/02/2021  Diabetes mellitus type II with hyperglycemia: Continue insulin and Levemir and sliding scale for now  Hypertension -Monitor blood pressure.  Continue irbesartan/hydrochlorothiazide/nebivolol  Acute kidney injury -Presented with creatinine of 1.38: Baseline of around 1 to 2 months ago.  Treated with IV fluids.  Resolved.  Anemia -Unknown cause.  Hemoglobin 7.6.  Still no indication of transfusion.  Check in the morning.  Hypokalemia: Resolved  Thrombocytosis -Possibly reactive.  Monitor  DVT prophylaxis: SCDs Code Status: Full Family Communication: None present at bedside. Disposition Plan: Status is: Inpatient  Remains inpatient appropriate because:Inpatient level of care appropriate due to severity of illness   Dispo: The patient is from: Home              Anticipated d/c is to: Home with home health for wound VAC and PICC line care              Patient currently is not medically stable to d/c.   Difficult to place patient No  Consultants: Podiatry/vascular surgery/ID  Procedures:   aortogram and right lower extremity angiogram done by vascular surgery on 04/14/2021 fifth ray amputation of right foot and I&D of right foot on 04/16/2021   Antimicrobials: Rocephin and vancomycin from 04/12/2021 onwards.  Flagyl from 04/13/2021 onwards   Subjective: Is been repeatedly Asking to go home.  I have requested him to stay overnight to make sure  he gets his I&D done and getting PICC line for long-term IV antibiotics.  Objective: Vitals:   04/20/21 1941 04/21/21 0309 04/21/21 0817 04/21/21 1151  BP: 109/71 136/75 127/73 (!) 144/80  Pulse: 69 76 69 70  Resp: 18 20 18 18   Temp: 99.5 F (37.5 C) 99 F (37.2 C) 99.6 F (37.6 C)  99.4 F (37.4 C)  TempSrc: Oral Oral    SpO2: 99% 95% 99% 100%  Weight:      Height:        Intake/Output Summary (Last 24 hours) at 04/21/2021 1405 Last data filed at 04/21/2021 1233 Gross per 24 hour  Intake 25 ml  Output 600 ml  Net -575 ml   Filed Weights   04/13/21 0116 04/14/21 1640 04/19/21 1146  Weight: 90.8 kg 90.8 kg 90.8 kg    Examination:  General exam: Appears calm and comfortable  Respiratory system: Clear to auscultation. Respiratory effort normal. Cardiovascular system: S1 & S2 heard, RRR. No JVD, murmurs, rubs, gallops or clicks. No pedal edema. Gastrointestinal system: Abdomen is nondistended, soft and nontender. No organomegaly or masses felt. Normal bowel sounds heard. Central nervous system: Alert and oriented. No focal neurological deficits. Extremities: Symmetric 5 x 5 power. Skin: Right foot dressing.  Direct visualization deferred.  Data Reviewed: I have personally reviewed following labs and imaging studies  CBC: Recent Labs  Lab 04/17/21 0535 04/18/21 0528 04/19/21 0312 04/20/21 0607 04/21/21 0527  WBC 27.8* 21.5* 17.9* 16.3* 15.8*  NEUTROABS 24.5* 17.8* 14.2* 13.6* 12.4*  HGB 8.2* 9.1* 7.5* 7.3* 7.6*  HCT 25.3* 27.7* 22.4* 23.7* 23.5*  MCV 94.8 93.9 91.4 95.6 94.8  PLT 397 538* 471* 428* 762*   Basic Metabolic Panel: Recent Labs  Lab 04/16/21 0530 04/17/21 0535 04/18/21 0528 04/19/21 0312 04/20/21 0607 04/21/21 0527  NA 136 135 134* 135 136 137  K 3.3* 4.1 3.5 3.2* 3.8 3.6  CL 107 107 105 108 108 108  CO2 21* 18* 20* 21* 21* 20*  GLUCOSE 141* 251* 226* 136* 236* 65*  BUN 30* 32* 40* 29* 23* 17  CREATININE 0.93 0.99 1.07 0.91 0.88 0.73  CALCIUM 8.5* 8.4* 8.5* 8.4* 8.1* 8.2*  MG 1.9 2.0 2.0 1.8 2.0  --    GFR: Estimated Creatinine Clearance: 115.9 mL/min (by C-G formula based on SCr of 0.73 mg/dL). Liver Function Tests: No results for input(s): AST, ALT, ALKPHOS, BILITOT, PROT, ALBUMIN in the last 168 hours. No results for  input(s): LIPASE, AMYLASE in the last 168 hours. No results for input(s): AMMONIA in the last 168 hours. Coagulation Profile: No results for input(s): INR, PROTIME in the last 168 hours. Cardiac Enzymes: No results for input(s): CKTOTAL, CKMB, CKMBINDEX, TROPONINI in the last 168 hours. BNP (last 3 results) No results for input(s): PROBNP in the last 8760 hours. HbA1C: No results for input(s): HGBA1C in the last 72 hours. CBG: Recent Labs  Lab 04/20/21 1637 04/20/21 2102 04/21/21 0819 04/21/21 1152 04/21/21 1335  GLUCAP 145* 108* 78 79 85   Lipid Profile: No results for input(s): CHOL, HDL, LDLCALC, TRIG, CHOLHDL, LDLDIRECT in the last 72 hours. Thyroid Function Tests: No results for input(s): TSH, T4TOTAL, FREET4, T3FREE, THYROIDAB in the last 72 hours. Anemia Panel: No results for input(s): VITAMINB12, FOLATE, FERRITIN, TIBC, IRON, RETICCTPCT in the last 72 hours. Sepsis Labs: No results for input(s): PROCALCITON, LATICACIDVEN in the last 168 hours.  Recent Results (from the past 240 hour(s))  SARS CORONAVIRUS 2 (TAT 6-24 HRS) Nasopharyngeal Nasopharyngeal Swab  Status: None   Collection Time: 04/12/21  6:30 PM   Specimen: Nasopharyngeal Swab  Result Value Ref Range Status   SARS Coronavirus 2 NEGATIVE NEGATIVE Final    Comment: (NOTE) SARS-CoV-2 target nucleic acids are NOT DETECTED.  The SARS-CoV-2 RNA is generally detectable in upper and lower respiratory specimens during the acute phase of infection. Negative results do not preclude SARS-CoV-2 infection, do not rule out co-infections with other pathogens, and should not be used as the sole basis for treatment or other patient management decisions. Negative results must be combined with clinical observations, patient history, and epidemiological information. The expected result is Negative.  Fact Sheet for Patients: SugarRoll.be  Fact Sheet for Healthcare  Providers: https://www.woods-mathews.com/  This test is not yet approved or cleared by the [place] FDA and  has been authorized for detection and/or diagnosis of SARS-CoV-2 by FDA under an Emergency Use Authorization (EUA). This EUA will remain  in effect (meaning this test can be used) for the duration of the COVID-19 declaration under Se ction 564(b)(1) of the Act, 21 U.S.C. section 360bbb-3(b)(1), unless the authorization is terminated or revoked sooner.  Performed at [facility], [place] [address] [place], [place] [address]   Aerobic Culture w Gram Stain (superficial specimen)     Status: None   Collection Time: 04/13/21  8:56 AM   Specimen: Wound  Result Value Ref Range Status   Specimen Description WOUND RIGHT FOOT  Final   Special Requests NONE  Final   Gram Stain   Final    FEW WBC PRESENT,BOTH PMN AND MONONUCLEAR FEW GRAM POSITIVE COCCI RARE GRAM VARIABLE ROD    Culture   Final    RARE STREPTOCOCCUS AGALACTIAE TESTING AGAINST S. AGALACTIAE NOT ROUTINELY PERFORMED DUE TO PREDICTABILITY OF AMP/PEN/VAN SUSCEPTIBILITY. WITIHIN MIXED ORGANISMS Performed at [facility], [place] [address], [place], [place] [address]    Report Status 04/17/2021 FINAL  Final  CULTURE, BLOOD (ROUTINE X 2) w Reflex to ID Panel     Status: None   Collection Time: 04/14/21  8:27 AM   Specimen: BLOOD  Result Value Ref Range Status   Specimen Description BLOOD LEFT HAND  Final   Special Requests   Final    BOTTLES DRAWN AEROBIC AND ANAEROBIC Blood Culture adequate volume   Culture   Final    NO GROWTH 6 DAYS Performed at [facility], [address] [place], [place] [address]    Report Status 04/20/2021 FINAL  Final  CULTURE, BLOOD (ROUTINE X 2) w Reflex to ID Panel     Status: None   Collection Time: 04/14/21  8:28 AM   Specimen: BLOOD  Result Value Ref Range Status   Specimen Description BLOOD  RIGHT HAND  Final   Special Requests   Final    BOTTLES  DRAWN AEROBIC AND ANAEROBIC Blood Culture adequate volume   Culture   Final    NO GROWTH 6 DAYS Performed at [facility], [address], [place], [place] [address]    Report Status 04/20/2021 FINAL  Final  Aerobic/Anaerobic Culture w Gram Stain (surgical/deep wound)     Status: None (Preliminary result)   Collection Time: 04/16/21  1:47 PM   Specimen: PATH Other; Abscess  Result Value Ref Range Status   Specimen Description ABSCESS RIGHT FOOT  Final   Special Requests SWAB SPEC A  Final   Gram Stain   Final    ABUNDANT WBC PRESENT, PREDOMINANTLY PMN RARE GRAM  POSITIVE COCCI IN PAIRS    Culture   Final    NO GROWTH 4 DAYS NO ANAEROBES ISOLATED; CULTURE IN PROGRESS FOR 5 DAYS Performed at [facility], [place] [address] [place], [place] [address]    Report Status PENDING  Incomplete         Radiology Studies: [place] EKG SITE RITE  Result Date: 04/20/2021 If Site Rite image not attached, placement could not be confirmed due to current cardiac rhythm.       Scheduled Meds: . [MAR Hold] Chlorhexidine Gluconate Cloth  6 each Topical Daily  . [MAR Hold] gentamicin ointment   Topical BID  . [MAR Hold] irbesartan  300 mg Oral Daily   And  . [MAR Hold] hydrochlorothiazide  25 mg Oral Daily  . [MAR Hold] insulin aspart  0-15 Units Subcutaneous TID WC  . [MAR Hold] insulin aspart  0-5 Units Subcutaneous QHS  . [MAR Hold] insulin detemir  30 Units Subcutaneous Daily  . [MAR Hold] nebivolol  5 mg Oral Daily  . [MAR Hold] rosuvastatin  40 mg Oral Daily  . [MAR Hold] sodium chloride flush  10-40 mL Intracatheter Q12H   Continuous Infusions: . sodium chloride 10 mL/hr at 04/21/21 1355  . [MAR Hold] cefTRIAXone (ROCEPHIN)  IV 2 g (04/21/21 1028)  . [MAR Hold] metronidazole 500 mg (04/21/21 0603)          Max Sane, MD [facility] 04/21/2021, 2:05 PM

## 2021-04-21 NOTE — Progress Notes (Signed)
PT Cancellation Note  Patient Details Name: [name] MRN: [id number] DOB: 1966/07/05   Cancelled Treatment:    Reason Eval/Treat Not Completed: Patient at procedure or test/unavailable.  Pt currently off floor for procedure.  Will re-attempt PT treatment session at a later date/time.  Leitha Bleak, PT 04/21/21, 1:55 PM

## 2021-04-21 NOTE — Progress Notes (Signed)
Inpatient Diabetes Program Recommendations  AACE/ADA: New Consensus Statement on Inpatient Glycemic Control (2015)  Target Ranges:  Prepandial:   less than 140 mg/dL      Peak postprandial:   less than 180 mg/dL (1-2 hours)      Critically ill patients:  140 - 180 mg/dL   Lab Results  Component Value Date   GLUCAP 78 04/21/2021   HGBA1C 9.9 (H) 04/12/2021    Review of Glycemic Control Results for YOUCEF, KLAS (MRN [id number]) as of 04/21/2021 10:19  Ref. Range 04/19/2021 13:09 04/19/2021 15:49 04/19/2021 21:18 04/20/2021 08:36 04/20/2021 13:05 04/20/2021 16:37 04/20/2021 21:02 04/21/2021 08:19  Glucose-Capillary Latest Ref Range: 70 - 99 mg/dL 123 (H) 183 (H) 298 (H) 226 (H) 176 (H) 145 (H) 108 (H) 78  Results for KYDEN, POTASH (MRN [id number]) as of 04/21/2021 10:19  Ref. Range 04/21/2021 05:27  Glucose Latest Ref Range: 70 - 99 mg/dL 65 (L)   Diabetes history: DM 2 Home DM Meds: Jardiance 25 mg Daily                              Glipizide 10 mg Daily                             Actos 15 mg Daily  Current Orders: Novolog 0-15 units TID AC + HS                            Levemir 30 units Daily  Inpatient Diabetes Program Recommendations:    Note low fasting CBG this AM.  May consider holding Levemir 30 units until after procedure today.  Communicated with RN and MD.    Thanks,  Adah Perl, RN, BC-ADM Inpatient Diabetes Coordinator Pager [phone] (8a-5p)

## 2021-04-21 NOTE — Op Note (Signed)
OPERATIVE REPORT Patient name: [name] MRN: [id number] DOB: 01-13-1966  DOS:  04/19/2021  Preop Dx: OM RT foot. Abscess RT foot Postop Dx: same  Procedure:  1. Irrigation and debridement right foot. Application of Acell Micromatrix  Surgeon: Edrick Kins DPM  Anesthesia: 50-50 mixture of 2% lidocaine plain with 0.5% Marcaine plain totaling 68mL infiltrated in the patient's RT lower extremity in an ankle block fashion  Hemostasis: No tourniquet utilized  EBL: 20 mL Materials: Acell Micromatrix 1000g Injectables: none Pathology: none  Condition: The patient tolerated the procedure and anesthesia well. No complications noted or reported   Justification for procedure: The patient is a 55 y.o. male PMHx who presents today for surgical correction of abscess with infection right foot. All conservative modalities of been unsuccessful in providing any sort of satisfactory alleviation of symptoms with the patient. The patient was told benefits as well as possible side effects of the surgery. The patient consented for surgical correction. The patient consent form was reviewed. All patient questions were answered. No guarantees were expressed or implied. The patient and the surgeon both signed the patient consent form with the witness present and placed in the patient's chart.   Procedure in Detail: The patient was brought to the operating room, placed in the operating table in the supine position at which time an aseptic scrub and drape were performed about the patient's respective lower extremity after anesthesia was induced as described above. Attention was then directed to the surgical area where procedure number one commenced.  Procedure #1: Irrigation and debridement right foot.  Application of ACell micro matrix Medically necessary excisional debridement including muscle and deep fascial and ligamentous tissue was performed to the right foot using a combination of surgical #15  scalpel and scissors.  Excisional debridement of all the necrotic nonviable tissue down to healthier bleeding viable tissue was performed.  The plantar flap of skin that was created at the last incision and drainage surgery had now necrosed and was completely ischemic and nonviable.  Decision was made to debride and remove this nonviable plantar flap of skin. Measurements of the wound to the plantar aspect of the foot measured approximately 8.0 x 6.0 x 1.0 cm (LxWxD).   There is also a area of drainage to the first interdigital webspace of the right foot.  Attention was directed to this area and a curved hemostat was utilized to perforate the skin in this area.  Heavy amount of purulent drainage was expressed from this area soon as the skin was perforated.  The abscessed area to the first intermetatarsal and interdigital webspace was explored and all purulent drainage was expressed from the area.  Minimal bleeding was noted.  After excisional debridement of all the necrotic nonviable tissue copious irrigation was performed using 3 L of normal saline and pulse lavage.  Any bleeding vessels were cauterized with Bovie.  Again, there was minimal bleeding noted to the foot consistent with patient's given history of critical limb ischemia.  After copious irrigation, 1000 g of ACell micro matrix was placed along the wound surface followed by nonadherent Adaptic.  Dry sterile dressings were then applied to the debridement site about the patient's lower extremity.   The patient was then transferred from the operating room to the recovery room having tolerated the procedure and anesthesia well. All vital signs are stable. After a brief stay in the recovery room the patient was readmitted to inpatient room. Verbal as well as written instructions were provided  for the patient regarding wound care. The patient is to keep the dressings clean dry and intact.   Impression:  Based on today's findings intraoperatively and  the extent of the wound/infection the patient would benefit from below-knee amputation.  I plan on discussing this with the patient later this evening.  Benefits of below-knee amputation would far outweigh the long postoperative recovery and limb salvage which seems bleak at this moment.   Edrick Kins, DPM [facility]  Dr. Edrick Kins, DPM    [address], [place] [address]                Office [phone]  Fax [phone]

## 2021-04-21 NOTE — Progress Notes (Signed)
[facility] INFECTIOUS DISEASE PROGRESS NOTE Date of Admission:  04/12/2021     ID: [name] is a 55 y.o. male with  Diabetic foot infection Principal Problem:   Cellulitis of right foot Active Problems:   Hypertension   Hyperglycemia due to type 2 diabetes mellitus ([place])   Diabetic ulcer of foot with fat layer exposed ([place])   PAD (peripheral artery disease) ([place])   AKI (acute kidney injury) ([place])   Anemia   Subjective: No fevers, wbc down more to 15 Remains on ceftriaxone and flagyl.  ROS  Eleven systems are reviewed and negative except per hpi  Medications:  Antibiotics Given (last 72 hours)    Date/Time Action Medication Dose Rate   04/18/21 1531 New Bag/Given   metroNIDAZOLE (FLAGYL) IVPB 500 mg 500 mg 100 mL/hr   04/18/21 2146 New Bag/Given   vancomycin (VANCOREADY) IVPB 1500 mg/300 mL 1,500 mg 150 mL/hr   04/18/21 2332 New Bag/Given   metroNIDAZOLE (FLAGYL) IVPB 500 mg 500 mg 100 mL/hr   04/19/21 0427 New Bag/Given   cefTRIAXone (ROCEPHIN) 1 g in sodium chloride 0.9 % 100 mL IVPB 1 g 200 mL/hr   04/19/21 0653 New Bag/Given   metroNIDAZOLE (FLAGYL) IVPB 500 mg 500 mg 100 mL/hr   04/19/21 1534 New Bag/Given   metroNIDAZOLE (FLAGYL) IVPB 500 mg 500 mg 100 mL/hr   04/19/21 2230 New Bag/Given   metroNIDAZOLE (FLAGYL) IVPB 500 mg 500 mg 100 mL/hr   04/20/21 0331 New Bag/Given   cefTRIAXone (ROCEPHIN) 1 g in sodium chloride 0.9 % 100 mL IVPB 1 g 200 mL/hr   04/20/21 1884 New Bag/Given   metroNIDAZOLE (FLAGYL) IVPB 500 mg 500 mg 100 mL/hr   04/20/21 1453 New Bag/Given   metroNIDAZOLE (FLAGYL) IVPB 500 mg 500 mg 100 mL/hr   04/20/21 2129 New Bag/Given   metroNIDAZOLE (FLAGYL) IVPB 500 mg 500 mg 100 mL/hr   04/21/21 0603 New Bag/Given   metroNIDAZOLE (FLAGYL) IVPB 500 mg 500 mg 100 mL/hr   04/21/21 1028 New Bag/Given  [new PICC]   cefTRIAXone (ROCEPHIN) 2 g in sodium chloride 0.9 % 100 mL IVPB 2 g 200 mL/hr     . Chlorhexidine Gluconate Cloth  6 each Topical  Daily  . gentamicin ointment   Topical BID  . irbesartan  300 mg Oral Daily   And  . hydrochlorothiazide  25 mg Oral Daily  . insulin aspart  0-15 Units Subcutaneous TID WC  . insulin aspart  0-5 Units Subcutaneous QHS  . insulin detemir  30 Units Subcutaneous Daily  . nebivolol  5 mg Oral Daily  . rosuvastatin  40 mg Oral Daily  . sodium chloride flush  10-40 mL Intracatheter Q12H    Objective: Vital signs in last 24 hours: Temp:  [98 F (36.7 C)-99.6 F (37.6 C)] 99.6 F (37.6 C) (05/12 0817) Pulse Rate:  [65-76] 69 (05/12 0817) Resp:  [14-20] 18 (05/12 0817) BP: (109-136)/(71-80) 127/73 (05/12 0817) SpO2:  [95 %-99 %] 99 % (05/12 0817) Constitutional: He is oriented to person, place, and time. He appears well-developed and well-nourished. No distress.  HENT:  Mouth/Throat: Oropharynx is clear and moist. No oropharyngeal exudate.  Cardiovascular: Normal rate, regular rhythm and normal heart sounds. Exam reveals no gallop and no friction rub.  No murmur heard.  Pulmonary/Chest: Effort normal and breath sounds normal. No respiratory distress. He has no wheezes.  Abdominal: Soft. Bowel sounds are normal. He exhibits no distension. There is no tenderness.  Lymphadenopathy:  He  has no cervical adenopathy.  Neurological: He is alert and oriented to person, place, and time.  Skin: Foot wrapped and pt refuses exam due to pain Psychiatric: He has a normal mood and affect. His behavior is normal.    Lab Results Recent Labs    04/20/21 0607 04/21/21 0527  WBC 16.3* 15.8*  HGB 7.3* 7.6*  HCT 23.7* 23.5*  NA 136 137  K 3.8 3.6  CL 108 108  CO2 21* 20*  BUN 23* 17  CREATININE 0.88 0.73    Microbiology: Results for orders placed or performed during the hospital encounter of 04/12/21  SARS CORONAVIRUS 2 (TAT 6-24 HRS) Nasopharyngeal Nasopharyngeal Swab     Status: None   Collection Time: 04/12/21  6:30 PM   Specimen: Nasopharyngeal Swab  Result Value Ref Range Status    SARS Coronavirus 2 NEGATIVE NEGATIVE Final    Comment: (NOTE) SARS-CoV-2 target nucleic acids are NOT DETECTED.  The SARS-CoV-2 RNA is generally detectable in upper and lower respiratory specimens during the acute phase of infection. Negative results do not preclude SARS-CoV-2 infection, do not rule out co-infections with other pathogens, and should not be used as the sole basis for treatment or other patient management decisions. Negative results must be combined with clinical observations, patient history, and epidemiological information. The expected result is Negative.  Fact Sheet for Patients: SugarRoll.be  Fact Sheet for Healthcare Providers: https://www.woods-mathews.com/  This test is not yet approved or cleared by the [place] FDA and  has been authorized for detection and/or diagnosis of SARS-CoV-2 by FDA under an Emergency Use Authorization (EUA). This EUA will remain  in effect (meaning this test can be used) for the duration of the COVID-19 declaration under Se ction 564(b)(1) of the Act, 21 U.S.C. section 360bbb-3(b)(1), unless the authorization is terminated or revoked sooner.  Performed at [facility], [place] [address], [place], [place] [address]   Aerobic Culture w Gram Stain (superficial specimen)     Status: None   Collection Time: 04/13/21  8:56 AM   Specimen: Wound  Result Value Ref Range Status   Specimen Description WOUND RIGHT FOOT  Final   Special Requests NONE  Final   Gram Stain   Final    FEW WBC PRESENT,BOTH PMN AND MONONUCLEAR FEW GRAM POSITIVE COCCI RARE GRAM VARIABLE ROD    Culture   Final    RARE STREPTOCOCCUS AGALACTIAE TESTING AGAINST S. AGALACTIAE NOT ROUTINELY PERFORMED DUE TO PREDICTABILITY OF AMP/PEN/VAN SUSCEPTIBILITY. WITIHIN MIXED ORGANISMS Performed at [facility], [place] [address], [place], [place] [address]    Report Status 04/17/2021 FINAL  Final  CULTURE, BLOOD  (ROUTINE X 2) w Reflex to ID Panel     Status: None   Collection Time: 04/14/21  8:27 AM   Specimen: BLOOD  Result Value Ref Range Status   Specimen Description BLOOD LEFT HAND  Final   Special Requests   Final    BOTTLES DRAWN AEROBIC AND ANAEROBIC Blood Culture adequate volume   Culture   Final    NO GROWTH 6 DAYS Performed at [facility], [place] [place], [place] [address]    Report Status 04/20/2021 FINAL  Final  CULTURE, BLOOD (ROUTINE X 2) w Reflex to ID Panel     Status: None   Collection Time: 04/14/21  8:28 AM   Specimen: BLOOD  Result Value Ref Range Status   Specimen Description BLOOD  RIGHT HAND  Final   Special Requests  Final    BOTTLES DRAWN AEROBIC AND ANAEROBIC Blood Culture adequate volume   Culture   Final    NO GROWTH 6 DAYS Performed at [facility], [address] [place], [place] [address]    Report Status 04/20/2021 FINAL  Final  Aerobic/Anaerobic Culture w Gram Stain (surgical/deep wound)     Status: None (Preliminary result)   Collection Time: 04/16/21  1:47 PM   Specimen: PATH Other; Abscess  Result Value Ref Range Status   Specimen Description ABSCESS RIGHT FOOT  Final   Special Requests SWAB SPEC A  Final   Gram Stain   Final    ABUNDANT WBC PRESENT, PREDOMINANTLY PMN RARE GRAM POSITIVE COCCI IN PAIRS    Culture   Final    NO GROWTH 4 DAYS NO ANAEROBES ISOLATED; CULTURE IN PROGRESS FOR 5 DAYS Performed at [facility], [place] [address] [place], [place] [address]    Report Status PENDING  Incomplete    Studies/Results: [place] EKG SITE RITE  Result Date: 04/20/2021 If Site Rite image not attached, placement could not be confirmed due to current cardiac rhythm.  PAth DIAGNOSIS:  A. FOOT, RIGHT FIFTH RAY; AMPUTATION:  - ACUTE OSTEOMYELITIS.  - SKIN AND SUBCUTANEOUS TISSUE WITH ULCERATION AND ACUTE SUPPURATIVE  INFLAMMATION, EXTENDING TO INKED MARGIN.  - BONY MARGIN (ARTICULAR SURFACE) FREE OF ACUTE  INFLAMMATION.   Assessment/Plan: CRANFORD BLESSINGER is a 55 y.o. male with diabetic foot osteomyelitis of the right foot in a patient with poorly controlled diabetes with an A1c of 9.9.  He has undergone MRI which showed osteomyelitis of the right fifth metatarsal.  He is now status post amputation of the right fifth metatarsal with extensive findings of purulence.  He would likely need repeat surgery.  He has had an angiogram which was negative for any stenosis.  He has been on initially cefepime and metronidazole and vancomycin.  The cefepime was recently changed to ceftriaxone.  His white count remains elevated at 21.  He did initially have fevers but those seem to be improving. Cxs mixed but with strep agalactae.  Of note he was on doxycycline at the time of admission so cultures may be affected. 5/10- repeat debridement done today. WBC down to 17. Cxs with grp B strep within mixed organisms. No  MRSA, NO psuedomonas 5/11- extensive continued infection and necrotic tissue found at surgery yesterday. Poor prognosis for limb salave but pt declines BKA. Stopped vanco 5/10  as no MRSA found 5/12- path with acute infection to soft tissue margins but bony margins neg  Recommendations Picc in place ? Planned for repeat I and D . Given extent of infection and findings at surgery 5/10 will need min 6 weeks IV abx    Will plan of IV ceftriaxone (increase to 2 gm from 1 gm q 24) and oral flagyl  Thank you very much for the consult. Will follow with you.  Leonel Ramsay   04/21/2021, 11:12 AM

## 2021-04-21 NOTE — Progress Notes (Signed)
Peripherally Inserted Central Catheter Placement  The IV Nurse has discussed with the patient and/or persons authorized to consent for the patient, the purpose of this procedure and the potential benefits and risks involved with this procedure.  The benefits include less needle sticks, lab draws from the catheter, and the patient may be discharged home with the catheter. Risks include, but not limited to, infection, bleeding, blood clot (thrombus formation), and puncture of an artery; nerve damage and irregular heartbeat and possibility to perform a PICC exchange if needed/ordered by physician.  Alternatives to this procedure were also discussed.  [place] [facility] PICC patient education guide, fact sheet on infection prevention and patient information card has been provided to patient /or left at bedside.    PICC Placement Documentation  PICC Single Lumen 35/70/17 Right Basilic 42 cm 0 cm (Active)  Indication for Insertion or Continuance of Line Home intravenous therapies (PICC only) 04/21/21 1016  Exposed Catheter (cm) 0 cm 04/21/21 1016  Site Assessment Clean;Dry;Intact 04/21/21 1016  Line Status Flushed;Blood return noted;Saline locked 04/21/21 1016  Dressing Type Transparent 04/21/21 1016  Dressing Status Clean;Dry;Intact 04/21/21 1016  Antimicrobial disc in place? Yes 04/21/21 1016  Dressing Intervention New dressing;Other (Comment) 04/21/21 1016  Dressing Change Due 04/28/21 04/21/21 1016       [name] 04/21/2021, 10:17 AM

## 2021-04-21 NOTE — Brief Op Note (Signed)
04/21/2021  4:23 PM  PATIENT:  [name]  55 y.o. male  PRE-OPERATIVE DIAGNOSIS:  Right Foot Infection  POST-OPERATIVE DIAGNOSIS:  Right Foot Infection  PROCEDURE:  Procedure(s): IRRIGATION AND DEBRIDEMENT FOOT (Right) APPLICATION OF WOUND VAC (Right)  SURGEON:  Surgeon(s) and Role:    Edrick Kins, DPM - Primary  PHYSICIAN ASSISTANT:   ASSISTANTS: none   ANESTHESIA:   local and general  EBL:  3 mL   BLOOD ADMINISTERED:none  DRAINS: none   LOCAL MEDICATIONS USED:  MARCAINE    and LIDOCAINE   SPECIMEN:  No Specimen  DISPOSITION OF SPECIMEN:  N/A  COUNTS:  YES  TOURNIQUET:  * Missing tourniquet times found for documented tourniquets in log: [id number] *  DICTATION: .Dragon Dictation  PLAN OF CARE: Admit to inpatient   PATIENT DISPOSITION:  PACU - hemodynamically stable.   Delay start of Pharmacological VTE agent (>24hrs) due to surgical blood loss or risk of bleeding: no

## 2021-04-21 NOTE — Anesthesia Postprocedure Evaluation (Signed)
Anesthesia Post Note  Patient: [name]  Procedure(s) Performed: IRRIGATION AND DEBRIDEMENT FOOT (Right Foot) APPLICATION OF WOUND VAC (Right Foot)  Patient location during evaluation: PACU Anesthesia Type: General Level of consciousness: awake and alert Pain management: pain level controlled Vital Signs Assessment: post-procedure vital signs reviewed and stable Respiratory status: spontaneous breathing, nonlabored ventilation, respiratory function stable and patient connected to nasal cannula oxygen Cardiovascular status: blood pressure returned to baseline and stable Postop Assessment: no apparent nausea or vomiting Anesthetic complications: no   No complications documented.   Last Vitals:  Vitals:   04/21/21 1715 04/21/21 1747  BP: 140/78 136/75  Pulse: 79 79  Resp: 16 16  Temp: 36.7 C 36.8 C  SpO2: 92%     Last Pain:  Vitals:   04/21/21 1747  TempSrc: Oral  PainSc:                  Arita Miss

## 2021-04-21 NOTE — Transfer of Care (Signed)
Immediate Anesthesia Transfer of Care Note  Patient: [name]  Procedure(s) Performed: IRRIGATION AND DEBRIDEMENT FOOT (Right Foot) APPLICATION OF WOUND VAC (Right Foot)  Patient Location: PACU  Anesthesia Type:General  Level of Consciousness: drowsy  Airway & Oxygen Therapy: Patient Spontanous Breathing and Patient connected to face mask oxygen  Post-op Assessment: Report given to RN  Post vital signs: stable  Last Vitals:  Vitals Value Taken Time  BP    Temp    Pulse 82 04/21/21 1629  Resp 28 04/21/21 1629  SpO2 97 % 04/21/21 1629  Vitals shown include unvalidated device data.  Last Pain:  Vitals:   04/21/21 0309  TempSrc: Oral  PainSc:       Patients Stated Pain Goal: 1 (76/19/50 9326)  Complications: No complications documented.

## 2021-04-22 ENCOUNTER — Encounter: Payer: Self-pay | Admitting: Podiatry

## 2021-04-22 ENCOUNTER — Telehealth: Payer: Self-pay | Admitting: Podiatry

## 2021-04-22 DIAGNOSIS — I96 Gangrene, not elsewhere classified: Secondary | ICD-10-CM

## 2021-04-22 LAB — CBC WITH DIFFERENTIAL/PLATELET
Abs Immature Granulocytes: 0.17 10*3/uL — ABNORMAL HIGH (ref 0.00–0.07)
Basophils Absolute: 0 10*3/uL (ref 0.0–0.1)
Basophils Relative: 0 %
Eosinophils Absolute: 0 10*3/uL (ref 0.0–0.5)
Eosinophils Relative: 0 %
HCT: 23 % — ABNORMAL LOW (ref 39.0–52.0)
Hemoglobin: 7.3 g/dL — ABNORMAL LOW (ref 13.0–17.0)
Immature Granulocytes: 2 %
Lymphocytes Relative: 8 %
Lymphs Abs: 0.9 10*3/uL (ref 0.7–4.0)
MCH: 30.4 pg (ref 26.0–34.0)
MCHC: 31.7 g/dL (ref 30.0–36.0)
MCV: 95.8 fL (ref 80.0–100.0)
Monocytes Absolute: 0.9 10*3/uL (ref 0.1–1.0)
Monocytes Relative: 8 %
Neutro Abs: 9.2 10*3/uL — ABNORMAL HIGH (ref 1.7–7.7)
Neutrophils Relative %: 82 %
Platelets: 404 10*3/uL — ABNORMAL HIGH (ref 150–400)
RBC: 2.4 MIL/uL — ABNORMAL LOW (ref 4.22–5.81)
RDW: 12.8 % (ref 11.5–15.5)
WBC: 11.3 10*3/uL — ABNORMAL HIGH (ref 4.0–10.5)
nRBC: 0 % (ref 0.0–0.2)

## 2021-04-22 LAB — BASIC METABOLIC PANEL
Anion gap: 6 (ref 5–15)
BUN: 17 mg/dL (ref 6–20)
CO2: 20 mmol/L — ABNORMAL LOW (ref 22–32)
Calcium: 7.9 mg/dL — ABNORMAL LOW (ref 8.9–10.3)
Chloride: 111 mmol/L (ref 98–111)
Creatinine, Ser: 0.85 mg/dL (ref 0.61–1.24)
GFR, Estimated: >60 mL/min (ref >60)
Glucose, Bld: 250 mg/dL — ABNORMAL HIGH (ref 70–99)
Potassium: 4 mmol/L (ref 3.5–5.1)
Sodium: 137 mmol/L (ref 135–145)

## 2021-04-22 LAB — GLUCOSE, CAPILLARY
Glucose-Capillary: 243 mg/dL — ABNORMAL HIGH (ref 70–99)
Glucose-Capillary: 266 mg/dL — ABNORMAL HIGH (ref 70–99)

## 2021-04-22 MED ORDER — CEFTRIAXONE IV (FOR PTA / DISCHARGE USE ONLY): 2.0000 g | INTRAVENOUS | 0 refills | Status: AC

## 2021-04-22 MED ORDER — IBUPROFEN 600 MG PO TABS
600.0000 mg | ORAL_TABLET | Freq: Three times a day (TID) | ORAL | 0 refills | Status: DC | PRN
Start: 2021-04-22 — End: 2021-11-09

## 2021-04-22 NOTE — TOC Transition Note (Signed)
Transition of Care [facility]) - CM/SW Discharge Note   Patient Details  Name: [name] MRN: [id number] Date of Birth: Jul 21, 1966  [facility] CM/SW Contact:  Su Hilt, RN Phone Number: 04/22/2021, 10:31 AM   Clinical Narrative:    Sent Dr Amalia Hailey message thru secure chat requesting the measurements of the wound for the Wound Vac, I called the surgeons office and requested the measurements The will call me back with the measurements for the wound vac, provided my contact information         Patient Goals and CMS Choice        Discharge Placement                       Discharge Plan and Services                                     Social Determinants of Health (SDOH) Interventions     Readmission Risk Interventions No flowsheet data found.

## 2021-04-22 NOTE — Discharge Instructions (Signed)
Cellulitis, Adult  Cellulitis is a skin infection. The infected area is often warm, red, swollen, and sore. It occurs most often in the arms and lower legs. It is very important to get treated for this condition. What are the causes? This condition is caused by bacteria. The bacteria enter through a break in the skin, such as a cut, burn, insect bite, open sore, or crack. What increases the risk? This condition is more likely to occur in people who:  Have a weak body defense system (immune system).  Have open cuts, burns, bites, or scrapes on the skin.  Are older than 54 years of age.  Have a blood sugar problem (diabetes).  Have a long-lasting (chronic) liver disease (cirrhosis) or kidney disease.  Are very overweight (obese).  Have a skin problem, such as: ? Itchy rash (eczema). ? Slow movement of blood in the veins (venous stasis). ? Fluid buildup below the skin (edema).  Have been treated with high-energy rays (radiation).  Use IV drugs. What are the signs or symptoms? Symptoms of this condition include:  Skin that is: ? Red. ? Streaking. ? Spotting. ? Swollen. ? Sore or painful when you touch it. ? Warm.  A fever.  Chills.  Blisters. How is this diagnosed? This condition is diagnosed based on:  Medical history.  Physical exam.  Blood tests.  Imaging tests. How is this treated? Treatment for this condition may include:  Medicines to treat infections or allergies.  Home care, such as: ? Rest. ? Placing cold or warm cloths (compresses) on the skin.  Hospital care, if the condition is very bad. Follow these instructions at home: Medicines  Take over-the-counter and prescription medicines only as told by your doctor.  If you were prescribed an antibiotic medicine, take it as told by your doctor. Do not stop taking it even if you start to feel better. General instructions  Drink enough fluid to keep your pee (urine) pale yellow.  Do not touch  or rub the infected area.  Raise (elevate) the infected area above the level of your heart while you are sitting or lying down.  Place cold or warm cloths on the area as told by your doctor.  Keep all follow-up visits as told by your doctor. This is important.   Contact a doctor if:  You have a fever.  You do not start to get better after 1-2 days of treatment.  Your bone or joint under the infected area starts to hurt after the skin has healed.  Your infection comes back. This can happen in the same area or another area.  You have a swollen bump in the area.  You have new symptoms.  You feel ill and have muscle aches and pains. Get help right away if:  Your symptoms get worse.  You feel very sleepy.  You throw up (vomit) or have watery poop (diarrhea) for a long time.  You see red streaks coming from the area.  Your red area gets larger.  Your red area turns dark in color. These symptoms may represent a serious problem that is an emergency. Do not wait to see if the symptoms will go away. Get medical help right away. Call your local emergency services (911 in the [place] Do not drive yourself to the hospital. Summary  Cellulitis is a skin infection. The area is often warm, red, swollen, and sore.  This condition is treated with medicines, rest, and cold and warm cloths.  Take all medicines   only as told by your doctor.  Tell your doctor if symptoms do not start to get better after 1-2 days of treatment. This information is not intended to replace advice given to you by your health care provider. Make sure you discuss any questions you have with your health care provider. Document Revised: 04/18/2018 Document Reviewed: 04/18/2018 Elsevier Patient Education  2021 [place].  Negative Pressure Wound Therapy Home Guide Negative pressure wound therapy (NPWT) uses a sponge or foam-like material (dressing) placed on or inside the wound. The wound is then covered and  sealed with a cover dressing that sticks to your skin (is adhesive). This keeps air out. A tube is attached to the cover dressing, and this tube connects to a small pump. The pump sucks fluid and germs from the wound. NPWT helps to increase blood flow to the wound and heal it from the inside. What are the risks? NPWT is usually safe to use. However, problems can occur, including:  Skin irritation from the dressing adhesive.  Bleeding.  Infection.  Dehydration. Wounds with large amounts of drainage can cause excessive fluid loss.  Pain. Supplies needed:  A disposable garbage bag.  Soap and water, or hand sanitizer.  Wound cleanser or salt-water solution (saline).  New sponge and cover dressing.  Protective clothing.  Gauze pad.  Vinyl gloves.  Tape.  Skin protectant. This may be a wipe, film, or spray.  Clean or germ-free (sterile) scissors.  Eye protection. How to change your dressing Prepare to change your dressing 1. If told by your health care provider, take pain medicine 30 minutes before changing the dressing. 2. Wash your hands with soap and water. Dry your hands with a clean towel. If soap and water are not available, use hand sanitizer. 3. Set up a clean station for wound care. 4. Open the dressing package so that the sponge dressing remains on the inside of the package. 5. Wear gloves, protective clothing, and eye protection.   Remove old dressing 1. Turn off the pump and disconnect the tubing from the dressing. 2. Carefully remove the adhesive cover dressing in the direction of your hair growth. 3. Remove the sponge dressing that is inside the wound. If the sponge sticks, use a wound cleanser or saline solution to wet the sponge and help it come off more easily. 4. Throw the old sponge and cover dressing supplies into the garbage bag. 5. Remove your gloves by grabbing the cuff and turning the glove inside out. Place the gloves in the trash  immediately. 6. Wash your hands with soap and water. Dry your hands with a clean towel. If soap and water are not available, use hand sanitizer.   Clean your wound  Wear gloves, protective clothing, and eye protection. Follow your health care provider's instructions on how to clean your wound. You may be told to: 1. Clean the wound using a saline solution or a wound cleanser and a clean gauze pad. 2. Pat the wound dry with a gauze pad. Do not rub the wound. 3. Throw the gauze pad into the garbage bag. 4. Remove your gloves by grabbing the cuff and turning the glove inside out. Place the gloves in the trash immediately. 5. Wash your hands with soap and water. Dry your hands with a clean towel. If soap and water are not available, use hand sanitizer. Apply new dressing  Wear gloves, protective clothing, and eye protection. 1. If told by your health care provider, apply a skin protectant  to any skin that will be exposed to adhesive. Let the skin protectant dry. 2. Cut a piece of new sponge dressing and put it on or in the wound. 3. Using clean scissors, cut a nickel-sized hole in the new cover dressing. 4. Apply the cover dressing. 5. Attach the suction tube over the hole in the cover dressing. 6. Take off your gloves. Put them in the plastic bag with the old dressing. Tie the bag shut and throw it away. 7. Wash your hands with soap and water. Dry your hands with a clean towel. If soap and water are not available, use hand sanitizer. 8. Turn the pump back on. The sponge dressing should collapse. Do not change the settings on the machine without talking to a health care provider. 9. Replace the container in the pump that collects fluid if it is full. Replace the container per the manufacturer's instructions or at least once a week, even if it is not full. General tips and recommendations If the alarm sounds:  Stay calm.  Do not turn off the pump or do anything with the dressing.  Reasons  the alarm may go off: ? The battery is low. Change the battery or plug the device into electrical power. ? The dressing has a leak. Find the leak and put tape over the leak. ? The fluid collection container is full. Change the fluid container.  Call your health care provider right away if you cannot fix the problem.  Explain to your health care provider what is happening. Follow his or her instructions. General instructions  Do not turn off the pump unless told to do so by your health care provider.  Do not turn off the pump for more than 2 hours. If the pump is off for more than 2 hours, the dressing will need to be changed.  If your health care provider says it is okay to shower: ? Do not take the pump into the shower. ? Make sure the wound dressing is protected and sealed. The wound dressing must stay dry.  Check frequently that the machine indicates that therapy is on and that all clamps are open.  Do not use over-the-counter medicated or antiseptic creams, sprays, liquids, or dressings unless your health care provider approves. Contact a health care provider if:  You have new pain.  You develop irritation, a rash, or itching around the wound or dressing.  You see new black or yellow tissue in your wound.  The dressing changes are painful or cause bleeding.  The pump has been off for more than 2 hours, and you do not know how to change the dressing.  The pump alarm goes off, and you do not know what to do. Get help right away if:  You have a lot of bleeding.  The wound breaks open.  You have severe pain.  You have signs of infection, such as: ? More redness, swelling, or pain. ? More fluid or blood. ? Warmth. ? Pus or a bad smell. ? Red streaks leading from the wound. ? A fever.  You see a sudden change in the color or texture of the drainage.  You have signs of dehydration, such as: ? Little or no tears, urine, or sweat. ? Muscle cramps. ? Very dry  mouth. ? Headache. ? Dizziness. Summary  Negative pressure wound therapy (NPWT) is a device that helps your wound heal.  Set up a clean station for wound care. Your health care provider will tell  you what supplies to use.  Follow your health care provider's instructions on how to clean your wound and how to change the dressing.  Contact a health care provider if you have new pain, an irritation, or a rash, or if the alarm goes off and you do not know what to do.  Get help right away if you have a lot of bleeding, your wound breaks open, or you have severe pain. Also, get help if you have signs of infection. This information is not intended to replace advice given to you by your health care provider. Make sure you discuss any questions you have with your health care provider. Document Revised: 02/02/2020 Document Reviewed: 02/14/2019 Elsevier Patient Education  [place].

## 2021-04-22 NOTE — TOC Progression Note (Addendum)
Transition of Care [facility]) - Progression Note    Patient Details  Name: [name] MRN: [id number] Date of Birth: 1966/11/02  Transition of [facility]) CM/SW [place], RN Phone Number: 04/22/2021, 1:05 PM  Clinical Narrative:    Notified [facility] with wound measurements, 10.0X 8.0 X 1.0, Notified Rhonda with [employer] that the patient needs a WC and the orders are in, it will be brought to his room prior to DC        Expected Discharge Plan and Services           Expected Discharge Date: 04/22/21                                     Social Determinants of Health (SDOH) Interventions    Readmission Risk Interventions No flowsheet data found.

## 2021-04-22 NOTE — Progress Notes (Signed)
Wound vac  Supplies received, RLE Boot in place, wound vac in place, all persona belongings taken by patients spouse. Pt off unit via [place], accompanied by this RN. 04/22/21 4:34 PM Pamelia Hoit Neftaly Inzunza

## 2021-04-22 NOTE — Progress Notes (Signed)
[facility] INFECTIOUS DISEASE PROGRESS NOTE Date of Admission:  04/12/2021     ID: CONN TROMBETTA is a 55 y.o. male with  Diabetic foot infection Principal Problem:   Cellulitis of right foot Active Problems:   Hypertension   Hyperglycemia due to type 2 diabetes mellitus ([place])   Diabetic ulcer of foot with fat layer exposed ([place])   PAD (peripheral artery disease) ([place])   AKI (acute kidney injury) ([place])   Anemia   Gangrene of right foot ([facility])   Subjective: No fevers, s/p repeat surgy 5/12 with wound vac placed  ROS  Eleven systems are reviewed and negative except per hpi  Medications:  Antibiotics Given (last 72 hours)    Date/Time Action Medication Dose Rate   04/19/21 1534 New Bag/Given   metroNIDAZOLE (FLAGYL) IVPB 500 mg 500 mg 100 mL/hr   04/19/21 2230 New Bag/Given   metroNIDAZOLE (FLAGYL) IVPB 500 mg 500 mg 100 mL/hr   04/20/21 0331 New Bag/Given   cefTRIAXone (ROCEPHIN) 1 g in sodium chloride 0.9 % 100 mL IVPB 1 g 200 mL/hr   04/20/21 0350 New Bag/Given   metroNIDAZOLE (FLAGYL) IVPB 500 mg 500 mg 100 mL/hr   04/20/21 1453 New Bag/Given   metroNIDAZOLE (FLAGYL) IVPB 500 mg 500 mg 100 mL/hr   04/20/21 2129 New Bag/Given   metroNIDAZOLE (FLAGYL) IVPB 500 mg 500 mg 100 mL/hr   04/21/21 0603 New Bag/Given   metroNIDAZOLE (FLAGYL) IVPB 500 mg 500 mg 100 mL/hr   04/21/21 1028 New Bag/Given  [new PICC]   cefTRIAXone (ROCEPHIN) 2 g in sodium chloride 0.9 % 100 mL IVPB 2 g 200 mL/hr   04/21/21 2128 New Bag/Given   metroNIDAZOLE (FLAGYL) IVPB 500 mg 500 mg 100 mL/hr   04/22/21 0938 New Bag/Given   metroNIDAZOLE (FLAGYL) IVPB 500 mg 500 mg 100 mL/hr   04/22/21 0934 New Bag/Given   cefTRIAXone (ROCEPHIN) 2 g in sodium chloride 0.9 % 100 mL IVPB 2 g 200 mL/hr     . Chlorhexidine Gluconate Cloth  6 each Topical Daily  . gentamicin ointment   Topical BID  . irbesartan  300 mg Oral Daily   And  . hydrochlorothiazide  25 mg Oral Daily  . insulin aspart  0-15 Units  Subcutaneous TID WC  . insulin aspart  0-5 Units Subcutaneous QHS  . insulin detemir  30 Units Subcutaneous Daily  . nebivolol  5 mg Oral Daily  . rosuvastatin  40 mg Oral Daily  . sodium chloride flush  10-40 mL Intracatheter Q12H    Objective: Vital signs in last 24 hours: Temp:  [97.8 F (36.6 C)-99 F (37.2 C)] 98.3 F (36.8 C) (05/13 0803) Pulse Rate:  [75-88] 75 (05/13 0803) Resp:  [7-28] 19 (05/13 0416) BP: (99-148)/(57-92) 128/74 (05/13 0803) SpO2:  [91 %-100 %] 100 % (05/13 0803) Constitutional: He is oriented to person, place, and time. He appears well-developed and well-nourished. No distress.  HENT:  Mouth/Throat: Oropharynx is clear and moist. No oropharyngeal exudate.  Cardiovascular: Normal rate, regular rhythm and normal heart sounds. Exam reveals no gallop and no friction rub.  No murmur heard.  Pulmonary/Chest: Effort normal and breath sounds normal. No respiratory distress. He has no wheezes.  Abdominal: Soft. Bowel sounds are normal. He exhibits no distension. There is no tenderness.  Lymphadenopathy:  He has no cervical adenopathy.  Neurological: He is alert and oriented to person, place, and time.  Skin: Foot wrapped - wound vac in place Psychiatric: He has a normal  mood and affect. His behavior is normal.    Lab Results Recent Labs    04/21/21 0527 04/22/21 0559  WBC 15.8* 11.3*  HGB 7.6* 7.3*  HCT 23.5* 23.0*  NA 137 137  K 3.6 4.0  CL 108 111  CO2 20* 20*  BUN 17 17  CREATININE 0.73 0.85    Microbiology: Results for orders placed or performed during the hospital encounter of 04/12/21  SARS CORONAVIRUS 2 (TAT 6-24 HRS) Nasopharyngeal Nasopharyngeal Swab     Status: None   Collection Time: 04/12/21  6:30 PM   Specimen: Nasopharyngeal Swab  Result Value Ref Range Status   SARS Coronavirus 2 NEGATIVE NEGATIVE Final    Comment: (NOTE) SARS-CoV-2 target nucleic acids are NOT DETECTED.  The SARS-CoV-2 RNA is generally detectable in upper and  lower respiratory specimens during the acute phase of infection. Negative results do not preclude SARS-CoV-2 infection, do not rule out co-infections with other pathogens, and should not be used as the sole basis for treatment or other patient management decisions. Negative results must be combined with clinical observations, patient history, and epidemiological information. The expected result is Negative.  Fact Sheet for Patients: SugarRoll.be  Fact Sheet for Healthcare Providers: https://www.woods-mathews.com/  This test is not yet approved or cleared by the [place] FDA and  has been authorized for detection and/or diagnosis of SARS-CoV-2 by FDA under an Emergency Use Authorization (EUA). This EUA will remain  in effect (meaning this test can be used) for the duration of the COVID-19 declaration under Se ction 564(b)(1) of the Act, 21 U.S.C. section 360bbb-3(b)(1), unless the authorization is terminated or revoked sooner.  Performed at [facility], [place] [address], [place], [place] [address]   Aerobic Culture w Gram Stain (superficial specimen)     Status: None   Collection Time: 04/13/21  8:56 AM   Specimen: Wound  Result Value Ref Range Status   Specimen Description WOUND RIGHT FOOT  Final   Special Requests NONE  Final   Gram Stain   Final    FEW WBC PRESENT,BOTH PMN AND MONONUCLEAR FEW GRAM POSITIVE COCCI RARE GRAM VARIABLE ROD    Culture   Final    RARE STREPTOCOCCUS AGALACTIAE TESTING AGAINST S. AGALACTIAE NOT ROUTINELY PERFORMED DUE TO PREDICTABILITY OF AMP/PEN/VAN SUSCEPTIBILITY. WITIHIN MIXED ORGANISMS Performed at [facility], [place] [address], [place], [place] [address]    Report Status 04/17/2021 FINAL  Final  CULTURE, BLOOD (ROUTINE X 2) w Reflex to ID Panel     Status: None   Collection Time: 04/14/21  8:27 AM   Specimen: BLOOD  Result Value Ref Range Status   Specimen Description BLOOD LEFT HAND   Final   Special Requests   Final    BOTTLES DRAWN AEROBIC AND ANAEROBIC Blood Culture adequate volume   Culture   Final    NO GROWTH 6 DAYS Performed at [facility], [place] [place], [place] [address]    Report Status 04/20/2021 FINAL  Final  CULTURE, BLOOD (ROUTINE X 2) w Reflex to ID Panel     Status: None   Collection Time: 04/14/21  8:28 AM   Specimen: BLOOD  Result Value Ref Range Status   Specimen Description BLOOD  RIGHT HAND  Final   Special Requests   Final    BOTTLES DRAWN AEROBIC AND ANAEROBIC Blood Culture adequate volume   Culture   Final    NO GROWTH 6 DAYS Performed at [facility],  [place], [place] [address]    Report Status 04/20/2021 FINAL  Final  Aerobic/Anaerobic Culture w Gram Stain (surgical/deep wound)     Status: None   Collection Time: 04/16/21  1:47 PM   Specimen: PATH Other; Abscess  Result Value Ref Range Status   Specimen Description ABSCESS RIGHT FOOT  Final   Special Requests SWAB SPEC A  Final   Gram Stain   Final    ABUNDANT WBC PRESENT, PREDOMINANTLY PMN RARE GRAM POSITIVE COCCI IN PAIRS    Culture   Final    No growth aerobically or anaerobically. Performed at [facility], [place] [address] [place], [place] [address]    Report Status 04/21/2021 FINAL  Final    Studies/Results: [place] EKG SITE RITE  Result Date: 04/20/2021 If Site Rite image not attached, placement could not be confirmed due to current cardiac rhythm.  PAth DIAGNOSIS:  A. FOOT, RIGHT FIFTH RAY; AMPUTATION:  - ACUTE OSTEOMYELITIS.  - SKIN AND SUBCUTANEOUS TISSUE WITH ULCERATION AND ACUTE SUPPURATIVE  INFLAMMATION, EXTENDING TO INKED MARGIN.  - BONY MARGIN (ARTICULAR SURFACE) FREE OF ACUTE INFLAMMATION.   Assessment/Plan: CHANTZ MONTEFUSCO is a 56 y.o. male with diabetic foot osteomyelitis of the right foot in a patient with poorly controlled diabetes with an A1c of 9.9.  He has undergone MRI which showed osteomyelitis of the  right fifth metatarsal.  He is now status post amputation of the right fifth metatarsal with extensive findings of purulence.  He would likely need repeat surgery.  He has had an angiogram which was negative for any stenosis.  He has been on initially cefepime and metronidazole and vancomycin.  The cefepime was recently changed to ceftriaxone.  His white count remains elevated at 21.  He did initially have fevers but those seem to be improving. Cxs mixed but with strep agalactae.  Of note he was on doxycycline at the time of admission so cultures may be affected. 5/10- repeat debridement done today. WBC down to 17. Cxs with grp B strep within mixed organisms. No  MRSA, NO psuedomonas 5/11- extensive continued infection and necrotic tissue found at surgery yesterday. Poor prognosis for limb [place] but pt declines BKA. Stopped vanco 5/10  as no MRSA found 5/12- path with acute infection to soft tissue margins but bony margins neg 5/13 - repeat debridement 5/12  Recommendations Given extent of infection and findings at surgery 5/10 will need min 6 weeks IV abx    Will plan of IV ceftriaxone 2 gm q 24 and oral flagyl 500 TID Cont wound vac See opat orders Thank you very much for the consult. Will follow with you.  Leonel Ramsay   04/22/2021, 1:50 PM

## 2021-04-22 NOTE — Progress Notes (Signed)
Inpatient Diabetes Program Recommendations  AACE/ADA: New Consensus Statement on Inpatient Glycemic Control   Target Ranges:  Prepandial:   less than 140 mg/dL      Peak postprandial:   less than 180 mg/dL (1-2 hours)      Critically ill patients:  140 - 180 mg/dL  Results for RAWLINS, STUARD (MRN [id number]) as of 04/22/2021 11:18  Ref. Range 04/21/2021 08:19 04/21/2021 11:52 04/21/2021 13:35 04/21/2021 16:32 04/21/2021 20:56 04/22/2021 08:05  Glucose-Capillary Latest Ref Range: 70 - 99 mg/dL 78 79 85 95 167 (H) 243 (H)  Novolog 5 units  Levemir 30 units   Results for ZOHAIR, EPP (MRN [id number]) as of 04/22/2021 11:18  Ref. Range 04/20/2021 08:36 04/20/2021 13:05 04/20/2021 16:37 04/20/2021 21:02  Glucose-Capillary Latest Ref Range: 70 - 99 mg/dL 226 (H)  Novolog 5 units  Levemir 30 units 176 (H)  Novolog 2 units 145 (H)  Novolog 2 units 108 (H)   Review of Glycemic Control  Current orders for Inpatient glycemic control: Levemir 30 units daily, Novolog 0-15 units TID with meals, Novolog 0-5 units QHS  Inpatient Diabetes Program Recommendations:    Insulin: No Levemir given at all on 04/21/21 and fasting glucose 243 mg/dl today as result. Noted Levemir 30 units already given today. If patient has any issues at all with hypoglycemia, may want to consider decreasing Levemir.  Thanks, Barnie Alderman, RN, MSN, CDE Diabetes Coordinator Inpatient Diabetes Program [phone] (Team Pager from 8am to 5pm)

## 2021-04-22 NOTE — Progress Notes (Signed)
Pt being discharged home with home health, RUE single lumen PICC dressing CDI, flushed and clamped. Discharge instructions and education provided, discharge paperwork given to patient, patient verbalizes understanding of discharge instructions, spouse at bedside, WC delivered to room, waiting for woundvac to be delivered.

## 2021-04-22 NOTE — Progress Notes (Signed)
Physical Therapy Treatment Patient Details Name: [name] MRN: [id number] DOB: Aug 08, 1966 Today's Date: 04/22/2021    History of Present Illness Pt admitted with R foot wound and pain; wound to R lateral foot.  Pt admitted with cellulitis of R foot, diabetes foot ulcer (R foot with infection), PAD with h/o L metatarsal base amputation, hyperglycemia, htn, AKI, and anemia.  Pt s/p diagnostic R LE angiogram 5/5.  Diabetic ulcer lateral aspect R foot (+) osteomyelitis.  S/p 5th ray R amputation and I&D 5/7.  S/p R foot ulcer wound debridement 5/10.  PMH includes DM, htn, HLD, h/o L partial 5th metatarsal base resection July 2021.    PT Comments    Pt now with orders for (R LE) minimal WB'ing in a cam walker, for transition purposes only, with the assistance of a walker.  Pt and pt's wife educated on new [place] and activity restrictions.  Per discussion with MD Evans, manual w/c recommended d/t pt's new activity restrictions and WB'ing precautions.  Pt declined any mobility during session (even with pt's wife's encouragement) but agreeable to learning how to safely use manual w/c (via education).  Therapist educated pt and pt's wife on how to safely use manual w/c (including brake use) and adjust manual w/c for appropriate fit; also educated pt and pt's wife on how to safely propel manual w/c (including tight spaces); also educated pt and pt's wife on how to safely bump pt up/down stairs (3 to enter home) in manual w/c with strong 2 person assist.  Pt and pt's wife report no questions/concerns regarding manual w/c use, managing at home, functional mobility, [place] precautions, and activity restrictions.  Pt reports having RW at home and care management notified of pt's need for [facility].  Pt reporting no questions/concerns for home discharge today.   Follow Up Recommendations  No PT follow up     Equipment Recommendations  Rolling walker with 5" wheels;3in1 (PT);Wheelchair (measurements PT);Wheelchair  cushion (measurements PT)    Recommendations for Other Services       Precautions / Restrictions Precautions Precautions: Fall Precaution Comments: Elevate operative extremity on 2 pillows at all times Restrictions Weight Bearing Restrictions: Yes Other Position/Activity Restrictions: R LE:  Minimal WB'ing in a cam walker, for transition purposes only, with the assistance of a walker    Mobility  Bed Mobility               General bed mobility comments: Pt declined any mobility during session    Transfers                    Ambulation/Gait                 Stairs             Wheelchair Mobility    Modified Rankin (Stroke Patients Only)       Balance Overall balance assessment: Needs assistance Sitting-balance support: No upper extremity supported;Feet supported Sitting balance-Leahy Scale: Normal Sitting balance - Comments: steady sitting reaching outside BOS                                    Cognition Arousal/Alertness: Awake/alert Behavior During Therapy: WFL for tasks assessed/performed Overall Cognitive Status: Within Functional Limits for tasks assessed  Exercises Other Exercises Other Exercises: W/c use/management/safety    General Comments General comments (skin integrity, edema, etc.): R foot dressings and wound vac in place      Pertinent Vitals/Pain Pain Assessment: No/denies pain Pain Location: R foot Pain Intervention(s): Limited activity within patient's tolerance;Monitored during session    Home Living                      Prior Function            PT Goals (current goals can now be found in the care plan section) Acute Rehab PT Goals Patient Stated Goal: to go home PT Goal Formulation: With patient Time For Goal Achievement: 05/04/21 Potential to Achieve Goals: Good Progress towards PT goals: Not progressing toward goals -  comment (pt declined any mobility during session)    Frequency    Min 2X/week      PT Plan Current plan remains appropriate    Co-evaluation              AM-PAC PT "6 Clicks" Mobility   Outcome Measure  Help needed turning from your back to your side while in a flat bed without using bedrails?: None Help needed moving from lying on your back to sitting on the side of a flat bed without using bedrails?: None Help needed moving to and from a bed to a chair (including a wheelchair)?: A Little Help needed standing up from a chair using your arms (e.g., wheelchair or bedside chair)?: A Little Help needed to walk in hospital room?: A Little Help needed climbing 3-5 steps with a railing? : A Lot 6 Click Score: 19    End of Session   Activity Tolerance: Patient tolerated treatment well Patient left: in bed;with call bell/phone within reach;Other (comment) (R LE elevated in bed) Nurse Communication: Precautions;Mobility status;Weight bearing status PT Visit Diagnosis: Other abnormalities of gait and mobility (R26.89);Muscle weakness (generalized) (M62.81);Pain Pain - Right/Left: Right Pain - part of body: Ankle and joints of foot     Time: 1414 [phone] ([phone]) PT Time Calculation (min) (ACUTE ONLY): 35 min  Charges:  $Wheel Chair Management: 23-37 mins                    Leitha Bleak, PT 04/22/21, 3:43 PM

## 2021-04-22 NOTE — Telephone Encounter (Signed)
Calling to get measurements for wound vac; not in the surgical notes. Please advise.

## 2021-04-24 NOTE — Discharge Summary (Signed)
[place] [facility] [place] NAME: [name]    MR#:  [id number]  DATE OF BIRTH:  [birth year]  DATE OF ADMISSION:  04/12/2021   ADMITTING PHYSICIAN: Athena Masse, MD  DATE OF DISCHARGE: 04/22/2021  4:57 PM  PRIMARY CARE PHYSICIAN: [name], MD   ADMISSION DIAGNOSIS:  Cellulitis [L03.90] Gangrene of right foot ([place]) [I96] DISCHARGE DIAGNOSIS:  Principal Problem:   Cellulitis of right foot Active Problems:   Hypertension   Hyperglycemia due to type 2 diabetes mellitus ([place])   Diabetic ulcer of foot with fat layer exposed ([place])   PAD (peripheral artery disease) ([facility])   AKI (acute kidney injury) ([place])   Anemia   Gangrene of right foot ([place])  SECONDARY DIAGNOSIS:   Past Medical History:  Diagnosis Date  . Diabetes mellitus without complication ([place])   . Hyperlipidemia   . Hypertension    HOSPITAL COURSE:  55 y.o.malewith medical history significant forDM type II, HTN, PAD with history of left partial fifth metatarsal base resection July 2021 after presenting with gangrene presented with worsening right foot pain and worsening foot ulcer despite undergoing excisional debridement on 04/05/2021 by podiatry and oral doxycycline.  He was admitted for osteomyelitis.   Cellulitis of right foot/diabetic right foot ulcer infection with osteomyelitis of right fifth metatarsal PAD with history of left metatarsal base amputation Leukocytosis -Patient presented with worsening right foot infected ulcer despite undergoing wound debridement on 04/05/2021 and outpatient doxycycline   -MRI of the right foot was consistent with osteomyelitis.  -Status post aortogram and right lower extremity angiogram done by vascular surgery on 04/14/2021.   -WBCs improving.  -Status post fifth ray amputation of right foot and I&D of right foot on 04/16/2021 by podiatry.  OR cultures growing rare strep agalactiae.  Status post another debridement of the wound on 04/19/2021 and  on 04/21/2021.  Blood culture remain negative. - continue IV Rocephin and PO Flagyl till 06/02/2021 per ID - wound vac need to be changed 2x/week  Diabetes mellitus type II with hyperglycemia: Continue home regimen at discharge  Hypertension -Controlled on home regimen  Acute kidney injury -Presented with creatinine of 1.38: Improved with IV hydration  Anemia of chronic disease -No signs or symptoms of bleeding.  Hemoglobin 7.3 on the day of discharge  Hypokalemia: Resolved  Thrombocytosis -Likely reactive.    DISCHARGE CONDITIONS:  Stable CONSULTS OBTAINED:   DRUG ALLERGIES:  No Known Allergies DISCHARGE MEDICATIONS:   Allergies as of 04/22/2021   No Known Allergies     Medication List    STOP taking these medications   doxycycline 100 MG tablet Commonly known as: VIBRA-TABS   Vitamin D (Ergocalciferol) 1.25 MG (50000 UNIT) Caps capsule Commonly known as: DRISDOL     TAKE these medications   aspirin EC 81 MG tablet Take 81 mg by mouth daily.   cefTRIAXone  IVPB Commonly known as: ROCEPHIN Inject 2 g into the vein daily. Indication: diabetic foot infection with osteomyelitis First Dose: Yes Last Day of Therapy:  06/02/2021 Labs - Once weekly:  CBC/D, CMP, and CRP Method of administration: IV Push Method of administration may be changed at the discretion of home infusion pharmacist based upon assessment of the patient and/or caregiver's ability to self-administer the medication ordered.   empagliflozin 25 MG Tabs tablet Commonly known as: Jardiance Take 1 tablet (25 mg total) by mouth daily.   glipiZIDE 10 MG 24 hr tablet Commonly known  as: GLUCOTROL XL Take 1 tablet (10 mg total) by mouth daily with breakfast.   ibuprofen 600 MG tablet Commonly known as: ADVIL Take 1 tablet (600 mg total) by mouth every 8 (eight) hours as needed.   nebivolol 5 MG tablet Commonly known as: Bystolic Take 1 tablet (5 mg total) by mouth daily.   pioglitazone 15 MG  tablet Commonly known as: Actos Take 1 tablet (15 mg total) by mouth daily.   rosuvastatin 40 MG tablet Commonly known as: CRESTOR Take 1 tablet (40 mg total) by mouth daily.   torsemide 10 MG tablet Commonly known as: DEMADEX Take 10 mg by mouth daily.   valsartan-hydrochlorothiazide 320-25 MG tablet Commonly known as: DIOVAN-HCT Take 1 tablet by mouth daily.            Discharge Care Instructions  (From admission, onward)         Start     Ordered   04/22/21 0000  Change dressing on IV access line weekly and PRN  (Home infusion instructions - [facility] )        04/22/21 0856   04/22/21 0000  Discharge wound care:       Comments: wound vac will need to be changed 2x/week   04/22/21 0856         DISCHARGE INSTRUCTIONS:  Peak line care as ordered.  Wound VAC will need to be changed 2X/week DIET:  Cardiac diet DISCHARGE CONDITION:  Stable ACTIVITY:  Activity as tolerated OXYGEN:  Home Oxygen: No.  Oxygen Delivery: room air DISCHARGE LOCATION:  home with home health PT, OT, RN  If you experience worsening of your admission symptoms, develop shortness of breath, life threatening emergency, suicidal or homicidal thoughts you must seek medical attention immediately by calling 911 or calling your MD immediately  if symptoms less severe.  You Must read complete instructions/literature along with all the possible adverse reactions/side effects for all the Medicines you take and that have been prescribed to you. Take any new Medicines after you have completely understood and accpet all the possible adverse reactions/side effects.   Please note  You were cared for by a hospitalist during your hospital stay. If you have any questions about your discharge medications or the care you received while you were in the hospital after you are discharged, you can call the unit and asked to speak with the hospitalist on call if the hospitalist that took care of you is  not available. Once you are discharged, your primary care physician will handle any further medical issues. Please note that NO REFILLS for any discharge medications will be authorized once you are discharged, as it is imperative that you return to your primary care physician (or establish a relationship with a primary care physician if you do not have one) for your aftercare needs so that they can reassess your need for medications and monitor your lab values.    On the day of Discharge:  VITAL SIGNS:  Blood pressure (!) 149/82, pulse 66, temperature 98.4 F (36.9 C), resp. rate 17, height 6' (1.829 m), weight 90.8 kg, SpO2 99 %. PHYSICAL EXAMINATION:  GENERAL:  55 y.o.-year-old patient lying in the bed with no acute distress.  EYES: Pupils equal, round, reactive to light and accommodation. No scleral icterus. Extraocular muscles intact.  HEENT: Head atraumatic, normocephalic. Oropharynx and nasopharynx clear.  NECK:  Supple, no jugular venous distention. No thyroid enlargement, no tenderness.  LUNGS: Normal breath sounds bilaterally, no wheezing, rales,rhonchi  or crepitation. No use of accessory muscles of respiration.  CARDIOVASCULAR: S1, S2 normal. No murmurs, rubs, or gallops.  ABDOMEN: Soft, non-tender, non-distended. Bowel sounds present. No organomegaly or mass.  EXTREMITIES: No pedal edema, cyanosis, or clubbing.  NEUROLOGIC: Cranial nerves II through XII are intact. Muscle strength 5/5 in all extremities. Sensation intact. Gait not checked.  PSYCHIATRIC: The patient is alert and oriented x 3.  SKIN: No obvious rash, lesion, or ulcer.  DATA REVIEW:   CBC Recent Labs  Lab 04/22/21 0559  WBC 11.3*  HGB 7.3*  HCT 23.0*  PLT 404*    Chemistries  Recent Labs  Lab 04/20/21 0607 04/21/21 0527 04/22/21 0559  NA 136   < > 137  K 3.8   < > 4.0  CL 108   < > 111  CO2 21*   < > 20*  GLUCOSE 236*   < > 250*  BUN 23*   < > 17  CREATININE 0.88   < > 0.85  CALCIUM 8.1*   < >  7.9*  MG 2.0  --   --    < > = values in this interval not displayed.     Outpatient follow-up  Follow-up Information    Dew, Erskine Squibb, MD In 3 months.   Specialties: Vascular Surgery, Radiology, Interventional Cardiology Why: Can see Dew or Arna Medici. Seen as consult. Will need ABI with visit. Contact information: [place] [address] [phone]        [name], MD. Go on 04/28/2021.   Specialty: Family Medicine Why: 11:40 am Contact information: [address] [place] [address] [phone]        Edrick Kins, DPM. Go on 04/29/2021.   Specialty: Podiatry Why: 9:15 am [employer] information: [address] [place] [address] [place] [address] [phone]               30 Day Unplanned Readmission Risk Score   Flowsheet Row ED to Hosp-Admission (Discharged) from 04/12/2021 in [facility] (1A)  30 Day Unplanned Readmission Risk Score (%) 9.73 Filed at 04/22/2021 1600     This score is the patient's risk of an unplanned readmission within 30 days of being discharged (0 -100%). The score is based on dignosis, age, lab data, medications, orders, and past utilization.   Low:  0-14.9   Medium: 15-21.9   High: 22-29.9   Extreme: 30 and above         Management plans discussed with the patient, family and they are in agreement.  CODE STATUS: Prior   TOTAL TIME TAKING CARE OF THIS PATIENT: 45 minutes.    Max Sane M.D on 04/24/2021 at 6:31 PM  [facility]   CC: Primary care physician; [name], MD   Note: This dictation was prepared with Dragon dictation along with smaller phrase technology. Any transcriptional errors that result from this process are unintentional.

## 2021-04-25 ENCOUNTER — Telehealth: Payer: Self-pay

## 2021-04-25 ENCOUNTER — Telehealth (INDEPENDENT_AMBULATORY_CARE_PROVIDER_SITE_OTHER): Payer: Self-pay

## 2021-04-25 ENCOUNTER — Other Ambulatory Visit: Payer: Self-pay | Admitting: Family Medicine

## 2021-04-25 DIAGNOSIS — E559 Vitamin D deficiency, unspecified: Secondary | ICD-10-CM

## 2021-04-25 NOTE — Op Note (Signed)
OPERATIVE REPORT Patient name: [name] MRN: [id number] DOB: 05/18/1966  DOS: 04/21/2021  Preop Dx: Ulcer/abscess right foot Postop Dx: same  Procedure:  1.  Irrigation and excisional debridement right foot; including muscle and fascia 2.  Application of negative pressure wound VAC  Surgeon: Edrick Kins DPM  Anesthesia: 50-50 mixture of 2% lidocaine plain with 0.5% Marcaine plain totaling 20 mL infiltrated in the patient's right lower extremity in an ankle block fashion  Hemostasis: None  EBL: 50 mL Materials: KCI negative pressure wound VAC Injectables: None Pathology: None  Condition: The patient tolerated the procedure and anesthesia well. No complications noted or reported   Justification for procedure: The patient is a 55 y.o. male PMHx diabetes mellitus uncontrolled who presents today return to the OR for surgical correction of abscess/ulcer right foot. All conservative modalities of been unsuccessful in providing any sort of satisfactory alleviation of symptoms with the patient. The patient was told benefits as well as possible side effects of the surgery. The patient consented for surgical correction. The patient consent form was reviewed. All patient questions were answered. No guarantees were expressed or implied. The patient and the surgeon both signed the patient consent form with the witness present and placed in the patient's chart.   Procedure in Detail: The patient was brought to the operating room, placed in the operating table in the supine position at which time an aseptic scrub and drape were performed about the patient's respective lower extremity after anesthesia was induced as described above. Attention was then directed to the surgical area where procedure number one commenced.  Procedure #1: Irrigation and excisional debridement right foot; including muscle and fascia Attention was directed to the open wound of the right foot where medically  necessary excisional debridement including muscle and deep fascial tissue was performed using a surgical #15 scalpel and Versajet.  Excisional debridement of all the necrotic nonviable tissue down to healthier bleeding viable tissue was performed with postdebridement measurement same as pre-.  The wound measures approximately 10 cm x 8 cm x 1 cm.  All compartments of the foot were further explored to ensure that there was no remaining abscess within the foot.  There is minimal purulence noted.  Minimal bleeding is well noted to the distal toes of the foot.  Attention was also directed to the first interdigital webspace where previous incision and drainage had been performed.  Additional exploration of the area ensure that there was no remaining purulent abscess in the intermetatarsal space.  This area was packed with iodoform packing  Procedure #2: Application of negative pressure wound VAC right foot After appropriate debridement and irrigation, the foot was then prepared for application of negative pressure wound VAC. KCI negative pressure wound VAC was applied in standard manner to the right foot ulcer.  It was connected to the negative pressure VAC to ensure there were no leaks.  System check satisfactory.  Dry sterile compressive dressings were then applied to all previously mentioned incision sites about the patient's lower extremity.  The patient was then transferred from the operating room to the recovery room having tolerated the procedure and anesthesia well. All vital signs are stable. After a brief stay in the recovery room the patient was readmitted to inpatient room.    Impression: Patient remains high risk and high likelihood for below-knee amputation.  Minimal circulation noted to the distal forefoot.  Extensive wound complicated by uncontrolled diabetes mellitus and peripheral vascular disease provides a bleak  likelihood of wound healing.  Edrick Kins, DPM [facility]  Dr. Edrick Kins, DPM    [address] [place], [place] [address]                Office [phone]  Fax [phone]

## 2021-04-25 NOTE — Telephone Encounter (Signed)
Lori with [facility] called stated that she done a start of care plan with patient today and took wound vac off,  She stated that there is bone exposed and a lot of black liquid in the canister.  She requested a call back to discuss.  Please call [phone]

## 2021-04-25 NOTE — Telephone Encounter (Signed)
The pt's home care nurse called and said that once she removed the wound Vac that bone was exposed she wanted to know how should she dress the would she did not re-apply the Wound Vac  She also stated that the pt has a wound between his great toe and second toe. I made The NP Eulogio Ditch aware  per  Her his chart said that per record review the pt's surgical intervention have been done by podiatry  So she was instructed to contact podiatry for futher wound care instructions.

## 2021-04-26 NOTE — Telephone Encounter (Signed)
I spoke with [name] and notified her of instructions per Dr. Amalia Hailey to replace wound vac.  She stated that she was unable to get a good seal on vac yesterday and her and another nurse was going to try to get it sealed today.  I informed her that if they cannot get a seal to call office immediately and ask to speak to me for instructions from Dr. Amalia Hailey.  She verbalized understanding

## 2021-04-26 NOTE — Telephone Encounter (Signed)
Patient refused BKA. Wound vac is the only viable option for the foot at the moment. Continue with Wound Vac orders. - Dr. Amalia Hailey

## 2021-04-27 NOTE — Progress Notes (Signed)
Name: [name]   MRN: [id number]    DOB: May 26, 1966   Date:04/27/2021       Progress Note  [facility] follow up   I connected with  [name]  on 04/27/21 at 11:40 AM EDT by a video  encounter application that I am speaking with the correct person using two identifiers.  I discussed the limitations of evaluation and management by telemedicine and the availability of in person appointments. The patient expressed understanding and agreed to proceed with the virtual visit  Staff also discussed with the patient that there may be a patient responsible charge related to this service. Patient Location: at home  Provider Location: [facility]  Additional Individuals present: wife   HPI  Admission date: 04/12/2021 Discharge date: 04/22/2021  Medication reconciliation was done with patient   Patient developed ulceration of left foot a few weeks prior to admission to [facility]. He had a history of partial amputation of left 5th metatarsal and the wound was on the area. Upond admission his white count was over 21 thousand, CRP was high, he was anemic. Glucose was elevated and A1C 9.9% improved from 03/02 when it was 11.7%. He had vascular consult, ID, podiatrist consultations. He had angiography that did not show significant blockage, he was diagnosed with osteomyelitis and abscess of right foot. He had a firth Ray amputation and I&D abscess  done on 04/16/2021 and went back to OR a few more times for debridement and irrigation , he also had a wound vac applied prior to discharge  DM: during his stay he was on given insulin to control glucose. He has not been checking his glucose since he has been at home but asked for insulin. Explained that we can try insulin once he starts logging his levels twice daily, we may start with basal and stop glipizide. He will return with sugar logs so we can change his therapy within the next few weeks.   He denies any pain, he is walking on his  heels and is avoiding leaving his house. He has follow up with Dr. Amalia Hailey ( podiatrist ) tomorrow, also scheduled to see Dr. Lucky Cowboy ( vascular surgeon) and Dr. Ola Spurr ( ID ) soon.   He has home health at his house every other day changing his dressing and per note it is draining dark material and bone is now exposed. Patient is not wiling to having an amputation but is going to see Dr. Amalia Hailey tomorrow   Discussed getting lab work done. He will be going to Dr. Amalia Hailey tomorrow and may be able to stop at [facility], we may also be able to get home health nurse to draw labs at his house, he will have to call us back with the name of his home health agency  Patient Active Problem List   Diagnosis Date Noted  . Gangrene of right foot ([place])   . Cellulitis of right foot 04/12/2021  . PAD (peripheral artery disease) ([place]) 04/12/2021  . AKI (acute kidney injury) ([place]) 04/12/2021  . Anemia 04/12/2021  . Localized edema 10/14/2019  . Proteinuria 10/14/2019  . Secondary renal hyperparathyroidism ([place]) 03/19/2019  . Diabetic ulcer of foot with fat layer exposed ([place]) 12/18/2018  . Type 2 diabetes mellitus with chronic kidney disease and hypertension ([place]) 12/18/2018  . Dyslipidemia associated with type 2 diabetes mellitus ([place]) 07/03/2018  . Vitamin D deficiency 04/11/2018  . ED (erectile dysfunction) 04/10/2018  . Lower urinary tract  symptoms (LUTS) 02/09/2017  . Hypertension 07/05/2015  . Hyperglycemia due to type 2 diabetes mellitus ([place]) 07/05/2015  . HLD (hyperlipidemia) 07/05/2015    Past Surgical History:  Procedure Laterality Date  . AMPUTATION Right 04/16/2021   Procedure: AMPUTATION  RIGHT 5TH RAY, INCISION AND DRAINAGE;  Surgeon: Edrick Kins, DPM;  Location: [facility] ORS;  Service: Podiatry;  Laterality: Right;  . APPLICATION OF WOUND VAC Right 04/21/2021   Procedure: APPLICATION OF WOUND VAC;  Surgeon: Edrick Kins, DPM;  Location: [facility] OR[facility];  Service: Podiatry;  Laterality: Right;  . BONE  BIOPSY Left 06/27/2020   Procedure: BONE BIOPSY;  Surgeon: Caroline More, DPM;  Location: [facility] OR[facility];  Service: Podiatry;  Laterality: Left;  . INCISION AND DRAINAGE Left 06/27/2020   Procedure: INCISION AND DRAINAGE;  Surgeon: Caroline More, DPM;  Location: [facility] OR[facility];  Service: Podiatry;  Laterality: Left;  . IRRIGATION AND DEBRIDEMENT FOOT Right 04/21/2021   Procedure: IRRIGATION AND DEBRIDEMENT FOOT;  Surgeon: Edrick Kins, DPM;  Location: [facility] OR[facility];  Service: Podiatry;  Laterality: Right;  . LOWER EXTREMITY ANGIOGRAPHY Left 06/30/2020   Procedure: Lower Extremity Angiography;  Surgeon: Algernon Huxley, MD;  Location: [place] [facility];  Service: Cardiovascular;  Laterality: Left;  . LOWER EXTREMITY ANGIOGRAPHY Right 04/14/2021   Procedure: Lower Extremity Angiography;  Surgeon: Algernon Huxley, MD;  Location: [place] [facility];  Service: Cardiovascular;  Laterality: Right;  . NO PAST SURGERIES    . WOUND DEBRIDEMENT Right 04/19/2021   Procedure: DEBRIDEMENT WOUND;  Surgeon: Edrick Kins, DPM;  Location: [facility];  Service: Podiatry;  Laterality: Right;    Family History  Problem Relation Age of Onset  . Heart disease Father   . Heart attack Father   . Healthy Mother   . Prostate cancer Neg Hx   . Kidney cancer Neg Hx   . Bladder Cancer Neg Hx      Current Outpatient Medications:  .  aspirin EC 81 MG tablet, Take 81 mg by mouth daily., Disp: , Rfl:  .  cefTRIAXone (ROCEPHIN) IVPB, Inject 2 g into the vein daily. Indication: diabetic foot infection with osteomyelitis First Dose: Yes Last Day of Therapy:  06/02/2021 Labs - Once weekly:  CBC/D, CMP, and CRP Method of administration: IV Push Method of administration may be changed at the discretion of home infusion pharmacist based upon assessment of the patient and/or caregiver's ability to self-administer the medication ordered., Disp: 41 Units, Rfl: 0 .  empagliflozin (JARDIANCE) 25 MG TABS tablet, Take 1 tablet (25 mg total) by mouth daily.,  Disp: 90 tablet, Rfl: 0 .  glipiZIDE (GLUCOTROL XL) 10 MG 24 hr tablet, Take 1 tablet (10 mg total) by mouth daily with breakfast., Disp: 90 tablet, Rfl: 0 .  ibuprofen (ADVIL) 600 MG tablet, Take 1 tablet (600 mg total) by mouth every 8 (eight) hours as needed., Disp: 30 tablet, Rfl: 0 .  nebivolol (BYSTOLIC) 5 MG tablet, Take 1 tablet (5 mg total) by mouth daily., Disp: 90 tablet, Rfl: 0 .  pioglitazone (ACTOS) 15 MG tablet, Take 1 tablet (15 mg total) by mouth daily., Disp: 90 tablet, Rfl: 0 .  rosuvastatin (CRESTOR) 40 MG tablet, Take 1 tablet (40 mg total) by mouth daily., Disp: 90 tablet, Rfl: 0 .  torsemide (DEMADEX) 10 MG tablet, Take 10 mg by mouth daily., Disp: , Rfl:  .  valsartan-hydrochlorothiazide (DIOVAN-HCT) 320-25 MG tablet, Take 1 tablet by mouth daily., Disp: 90 tablet, Rfl: 0  No  Known Allergies  I personally reviewed active problem list, medication list, allergies, family history, social history, health maintenance with the patient/caregiver today.   ROS  Ten systems reviewed and is negative except as mentioned in HPI   Objective  Virtual encounter, vitals not obtained.  There is no height or weight on file to calculate BMI.  Physical Exam  Awake, alert and oriented   PHQ2/9: Depression screen [facility] 2/9 02/09/2021 10/11/2020 02/27/2020 11/26/2019 07/02/2019  Decreased Interest 0 0 0 0 0  Down, Depressed, Hopeless 0 0 0 0 0  PHQ - 2 Score 0 0 0 0 0  Altered sleeping - - 0 0 0  Tired, decreased energy - - 0 0 0  Change in appetite - - 0 0 0  Feeling bad or failure about yourself  - - 0 0 0  Trouble concentrating - - 0 0 0  Moving slowly or fidgety/restless - - 0 0 0  Suicidal thoughts - - 0 0 0  PHQ-9 Score - - 0 0 0  Difficult doing work/chores - - Not difficult at all - Not difficult at all   PHQ-2/9 Result is negative.    Fall Risk: Fall Risk  02/09/2021 10/11/2020 02/27/2020 11/26/2019 07/02/2019  Falls in the past year? 0 0 0 0 0  Number falls in past yr: 0  0 0 0 0  Injury with Fall? 0 0 0 0 0    Assessment & Plan  1. Vitamin D deficiency  - Vitamin D, Ergocalciferol, (DRISDOL) 1.25 MG (50000 UNIT) CAPS capsule; Take 1 capsule (50,000 Units total) by mouth every 7 (seven) days.  Dispense: 12 capsule; Refill: 0  2. History of amputation of lesser toe, left (HCC)  - blood glucose meter kit and supplies; 1 each by Other route in the morning, at noon, and at bedtime. Dispense based on patient and insurance preference. Use up to four times daily as directed. (FOR ICD-10 E10.9, E11.9).  Dispense: 1 each; Refill: 0  3. Diabetic foot infection ([place])  - blood glucose meter kit and supplies; 1 each by Other route in the morning, at noon, and at bedtime. Dispense based on patient and insurance preference. Use up to four times daily as directed. (FOR ICD-10 E10.9, E11.9).  Dispense: 1 each; Refill: 0 - Comprehensive metabolic panel  4. PAD (peripheral artery disease) ([place])   5. Osteomyelitis of toe of left foot ([place])   6. Anemia, unspecified type  - CBC with Differential/Platelet  7. B12 deficiency  - CBC with Differential/Platelet - Cyanocobalamin (B-12) 1000 MCG SUBL; Place 1 tablet under the tongue daily.  Dispense: 100 tablet; Refill: 1 I discussed the assessment and treatment plan with the patient. The patient was provided an opportunity to ask questions and all were answered. The patient agreed with the plan and demonstrated an understanding of the instructions.  The patient was advised to call back or seek an in-person evaluation if the symptoms worsen or if the condition fails to improve as anticipated.  I provided 25  minutes of non-face-to-face time during this encounter.

## 2021-04-28 ENCOUNTER — Telehealth: Payer: Self-pay

## 2021-04-28 ENCOUNTER — Ambulatory Visit (INDEPENDENT_AMBULATORY_CARE_PROVIDER_SITE_OTHER): Payer: Managed Care, Other (non HMO) | Admitting: Family Medicine

## 2021-04-28 ENCOUNTER — Encounter: Payer: Self-pay | Admitting: Family Medicine

## 2021-04-28 DIAGNOSIS — L089 Local infection of the skin and subcutaneous tissue, unspecified: Secondary | ICD-10-CM

## 2021-04-28 DIAGNOSIS — Z89422 Acquired absence of other left toe(s): Secondary | ICD-10-CM

## 2021-04-28 DIAGNOSIS — E559 Vitamin D deficiency, unspecified: Secondary | ICD-10-CM | POA: Diagnosis not present

## 2021-04-28 DIAGNOSIS — E538 Deficiency of other specified B group vitamins: Secondary | ICD-10-CM

## 2021-04-28 DIAGNOSIS — I739 Peripheral vascular disease, unspecified: Secondary | ICD-10-CM

## 2021-04-28 DIAGNOSIS — M869 Osteomyelitis, unspecified: Secondary | ICD-10-CM

## 2021-04-28 DIAGNOSIS — D649 Anemia, unspecified: Secondary | ICD-10-CM

## 2021-04-28 DIAGNOSIS — E11628 Type 2 diabetes mellitus with other skin complications: Secondary | ICD-10-CM | POA: Diagnosis not present

## 2021-04-28 MED ORDER — VITAMIN D (ERGOCALCIFEROL) 1.25 MG (50000 UNIT) PO CAPS: 50000.0000 [IU] | ORAL_CAPSULE | ORAL | 0 refills | Status: DC

## 2021-04-28 MED ORDER — BLOOD GLUCOSE METER KIT: 1.0000 | PACK | Freq: Three times a day (TID) | 0 refills | Status: DC

## 2021-04-28 MED ORDER — B-12 1000 MCG SL SUBL: 1.0000 | SUBLINGUAL_TABLET | Freq: Every day | SUBLINGUAL | 1 refills | Status: DC

## 2021-04-28 NOTE — Telephone Encounter (Signed)
Spoke with patient per Dr. Ancil Boozer and informed him that she would like for him to start taking B12 sub-lingually 1000 mcg daily, due to patient's b12 being very low. Also informed patient that per Dr. Ancil Boozer we have faxed over an order to [facility] for him to have his labs drawn on tomorrow.  I informed patient that the [facility] is across from Dr. Rebekah [place] office.  Patient verbalized understanding.

## 2021-04-29 ENCOUNTER — Ambulatory Visit (INDEPENDENT_AMBULATORY_CARE_PROVIDER_SITE_OTHER): Payer: Managed Care, Other (non HMO) | Admitting: Podiatry

## 2021-04-29 ENCOUNTER — Ambulatory Visit (INDEPENDENT_AMBULATORY_CARE_PROVIDER_SITE_OTHER): Payer: Managed Care, Other (non HMO)

## 2021-04-29 ENCOUNTER — Other Ambulatory Visit: Payer: Self-pay

## 2021-04-29 DIAGNOSIS — L97512 Non-pressure chronic ulcer of other part of right foot with fat layer exposed: Secondary | ICD-10-CM | POA: Diagnosis not present

## 2021-04-29 DIAGNOSIS — E0843 Diabetes mellitus due to underlying condition with diabetic autonomic (poly)neuropathy: Secondary | ICD-10-CM

## 2021-04-30 LAB — COMPREHENSIVE METABOLIC PANEL
ALT: 12 IU/L (ref 0–44)
AST: 9 IU/L (ref 0–40)
Albumin/Globulin Ratio: 0.9 — ABNORMAL LOW (ref 1.2–2.2)
Albumin: 3.1 g/dL — ABNORMAL LOW (ref 3.8–4.9)
Alkaline Phosphatase: 86 IU/L (ref 44–121)
BUN/Creatinine Ratio: 20 (ref 9–20)
BUN: 19 mg/dL (ref 6–24)
Bilirubin Total: 0.3 mg/dL (ref 0.0–1.2)
CO2: 21 mmol/L (ref 20–29)
Calcium: 9.1 mg/dL (ref 8.7–10.2)
Chloride: 100 mmol/L (ref 96–106)
Creatinine, Ser: 0.93 mg/dL (ref 0.76–1.27)
Globulin, Total: 3.6 g/dL (ref 1.5–4.5)
Glucose: 236 mg/dL — ABNORMAL HIGH (ref 65–99)
Potassium: 3.6 mmol/L (ref 3.5–5.2)
Sodium: 139 mmol/L (ref 134–144)
Total Protein: 6.7 g/dL (ref 6.0–8.5)
eGFR: 98 mL/min/{1.73_m2} (ref >59)

## 2021-04-30 LAB — CBC WITH DIFFERENTIAL/PLATELET
Basophils Absolute: 0 10*3/uL (ref 0.0–0.2)
Basos: 0 %
EOS (ABSOLUTE): 0 10*3/uL (ref 0.0–0.4)
Eos: 0 %
Hematocrit: 28.9 % — ABNORMAL LOW (ref 37.5–51.0)
Hemoglobin: 9.2 g/dL — ABNORMAL LOW (ref 13.0–17.7)
Immature Grans (Abs): 0.1 10*3/uL (ref 0.0–0.1)
Immature Granulocytes: 1 %
Lymphocytes Absolute: 0.8 10*3/uL (ref 0.7–3.1)
Lymphs: 10 %
MCH: 29.8 pg (ref 26.6–33.0)
MCHC: 31.8 g/dL (ref 31.5–35.7)
MCV: 94 fL (ref 79–97)
Monocytes Absolute: 0.5 10*3/uL (ref 0.1–0.9)
Monocytes: 7 %
Neutrophils Absolute: 6.6 10*3/uL (ref 1.4–7.0)
Neutrophils: 82 %
Platelets: 379 10*3/uL (ref 150–450)
RBC: 3.09 x10E6/uL — ABNORMAL LOW (ref 4.14–5.80)
RDW: 12.8 % (ref 11.6–15.4)
WBC: 8 10*3/uL (ref 3.4–10.8)

## 2021-05-02 ENCOUNTER — Telehealth: Payer: Self-pay | Admitting: Family Medicine

## 2021-05-02 ENCOUNTER — Other Ambulatory Visit: Payer: Self-pay

## 2021-05-02 DIAGNOSIS — E11628 Type 2 diabetes mellitus with other skin complications: Secondary | ICD-10-CM

## 2021-05-02 MED ORDER — CONTOUR NEXT ONE DEVI: 1.0000 | Freq: Once | 0 refills | Status: AC

## 2021-05-02 MED ORDER — CONTOUR NEXT TEST VI STRP: ORAL_STRIP | 12 refills | Status: DC

## 2021-05-02 MED ORDER — CONTOUR NEXT CONTROL NORMAL VI SOLN: 1.0000 | 3 refills | Status: DC

## 2021-05-02 NOTE — Telephone Encounter (Signed)
Kerri from [employer] (Tourist information centre manager) called to share this information  Best contact: [phone] direct extension [phone]  Pt needs Glucometer Contour with lancets/test strips to test his blood sugar  [facility] stated that his insurance will cover the Contour specifically 100% Fax: [phone] Phone: [phone]

## 2021-05-09 NOTE — Progress Notes (Signed)
   Subjective:  Patient presents today status post multiple irrigations and debridements of a severe left foot cellulitis/abscess secondary to diabetes mellitus; uncontrolled.  Last inpatient surgery was performed 04/21/2021 where irrigation debridement was performed with application of negative pressure wound VAC.  DOS: 04/21/2021.  While inpatient below-knee amputation was recommended however the patient refused.  He presents today for follow-up treatment and evaluation.  He currently has PICC line in place.  Wound VAC is also in place.  He has a nurse coming to the home every 2-3 days for wound VAC changes.  Past Medical History:  Diagnosis Date  . Diabetes mellitus without complication ([place])   . Hyperlipidemia   . Hypertension         Objective/Physical Exam Large open wound across the plantar aspect of the midfoot.  There is exposed bone to the cuboid.  Intermittent granulation and fibrotic tissue noted.  Periwound is macerated.  There is capillary refill to the toes.  Radiographic exam Absence of the fifth ray noted.  Otherwise normal osseous mineralization.  No gas within the tissues.  Assessment: 1. s/p multiple irrigation and debridements with subsequent application of wound VAC. DOS: 04/21/2021   Plan of Care:  1. Patient was evaluated. X-rays reviewed 2.  Today dry sterile dressings were applied to allow the maceration to air out and dry somewhat before reapplication of the wound VAC.  The nurse comes out to the house this following Monday for reapplication of the wound VAC. 3.  Continue IV antibiotics as per infectious disease 4.  Explained to the patient today that he remains very high risk for below-knee amputation.  There is some positive signs of granulation tissue formation. 5.  Return to clinic in 2 weeks   Edrick Kins, DPM [facility]  Dr. Edrick Kins, DPM    [address], [place] [address]                 Office [phone]  Fax [phone]

## 2021-05-12 ENCOUNTER — Other Ambulatory Visit: Payer: Self-pay | Admitting: Family Medicine

## 2021-05-12 DIAGNOSIS — E1169 Type 2 diabetes mellitus with other specified complication: Secondary | ICD-10-CM

## 2021-05-12 DIAGNOSIS — I10 Essential (primary) hypertension: Secondary | ICD-10-CM

## 2021-05-17 ENCOUNTER — Ambulatory Visit (INDEPENDENT_AMBULATORY_CARE_PROVIDER_SITE_OTHER): Payer: Managed Care, Other (non HMO) | Admitting: Podiatry

## 2021-05-17 ENCOUNTER — Other Ambulatory Visit: Payer: Self-pay

## 2021-05-17 DIAGNOSIS — L97512 Non-pressure chronic ulcer of other part of right foot with fat layer exposed: Secondary | ICD-10-CM

## 2021-05-17 DIAGNOSIS — I739 Peripheral vascular disease, unspecified: Secondary | ICD-10-CM

## 2021-05-17 DIAGNOSIS — E0843 Diabetes mellitus due to underlying condition with diabetic autonomic (poly)neuropathy: Secondary | ICD-10-CM

## 2021-05-17 NOTE — Progress Notes (Addendum)
   Subjective:  Patient presents today status post multiple irrigations and debridements of a severe left foot cellulitis/abscess secondary to diabetes mellitus; uncontrolled.  Patient has been having the nurse come to the home every 2-3 days for wound VAC changes.  He is still on his PICC line for the IV antibiotics and saw Dr. Ola Spurr yesterday.   Last inpatient surgery was performed 04/21/2021 where irrigation debridement was performed with application of negative pressure wound VAC.  DOS: 04/21/2021.  While inpatient below-knee amputation was recommended however the patient refused.  He presents today for follow-up treatment and evaluation.   Past Medical History:  Diagnosis Date   Diabetes mellitus without complication ([place])    Hyperlipidemia    Hypertension            Objective/Physical Exam Large open wound across the plantar aspect of the midfoot that measures approximately 15 cm x 10 cm x 1.0 cm.  There is exposed bone to the cuboid and fourth metatarsal head.  There does appear to be good granulation tissue that is improving.  Intermittent fibrotic tissue also noted.  Periwound is macerated.  There is capillary refill to the toes.  There is also an eschar noted to the dorsal lateral aspect of the foot approximately 4 cm in diameter.  Probing wound also noted to the first interdigital webspace extending into the deeper compartment of the foot.  There is no purulence noted.  There is exposed bone and fascial tissue.  No malodor noted.  Moderate serosanguineous drainage.  Assessment: 1. s/p multiple irrigation and debridements with subsequent application of wound VAC. DOS: 04/21/2021   Plan of Care:  1. Patient was evaluated. 2.  Medically necessary excisional debridement including muscle and deep fascial tissue was performed using a tissue nipper with postdebridement measurement same as pre-.  Excisional debridement of all necrotic nonviable tissue down to healthier bleeding  viable tissue was performed.   3.  Continue IV antibiotics as per infectious disease 4.  Patient continues to remain very high risk for below-knee amputation.  There is some positive signs of granulation tissue formation, however there still remains osteomyelitis of the foot which will undoubtedly lead to amputation at some point in the near future. Patient refuses amputation.   5.  Continue home health wound VAC dressing changes.  Nurses coming tomorrow for the dressing change 6.  Return to clinic in 3 weeks   Edrick Kins, DPM [facility]  Dr. Edrick Kins, DPM    [address], [place] [address]                Office [phone]  Fax [phone]

## 2021-05-20 ENCOUNTER — Other Ambulatory Visit: Payer: Self-pay | Admitting: Family Medicine

## 2021-05-20 DIAGNOSIS — E1169 Type 2 diabetes mellitus with other specified complication: Secondary | ICD-10-CM

## 2021-05-20 DIAGNOSIS — E118 Type 2 diabetes mellitus with unspecified complications: Secondary | ICD-10-CM

## 2021-05-20 DIAGNOSIS — E669 Obesity, unspecified: Secondary | ICD-10-CM

## 2021-05-23 ENCOUNTER — Ambulatory Visit: Payer: Managed Care, Other (non HMO) | Admitting: Family Medicine

## 2021-05-23 NOTE — Progress Notes (Deleted)
Name: [name]   MRN: [id number]    DOB: [birth year]   Date:05/23/2021       Progress Note  Subjective  Chief Complaint  Follow up   HPI  DMII: he lost to follow up again, last visit last Summer. He has been out of medications. He does not check his glucose at home. Refuses to use injectable medications.  He has a history of osteomyelitis, used to see Dr. Luana Shu but states no longer having problems. Last visit with  Dr. Candiss Norse was Summer 2021 , explained importance of follow up. BP is not at goal today. He has  CKI/HTN/DM and secondary hyperparathyroidism. Marland Kitchen He denies polyphagia, polydipsia or polyuria He states he has been taking medications as prescribed, but based on his medication refills he has not been very compliant    Macro albuminuria with secondary hyperparathyroidism: we will recheck labs today    HTN: He is on Norvasc and valsartan hctz , bp is not at goal, we will add bystolic today    Dyslipidemia: he is on Crestor, currently taking it daily, no side effects we will recheck labs  Patient Active Problem List   Diagnosis Date Noted   Gangrene of right foot ([place])    Cellulitis of right foot 04/12/2021   PAD (peripheral artery disease) ([place]) 04/12/2021   AKI (acute kidney injury) ([place]) 04/12/2021   Anemia 04/12/2021   Localized edema 10/14/2019   Proteinuria 10/14/2019   Secondary renal hyperparathyroidism ([place]) 03/19/2019   Diabetic ulcer of foot with fat layer exposed ([place]) 12/18/2018   Type 2 diabetes mellitus with chronic kidney disease and hypertension ([place]) 12/18/2018   Dyslipidemia associated with type 2 diabetes mellitus ([place]) 07/03/2018   Vitamin D deficiency 04/11/2018   ED (erectile dysfunction) 04/10/2018   Lower urinary tract symptoms (LUTS) 02/09/2017   Hypertension 07/05/2015   Hyperglycemia due to type 2 diabetes mellitus ([place]) 07/05/2015   HLD (hyperlipidemia) 07/05/2015    Past Surgical History:  Procedure Laterality Date   AMPUTATION Right  04/16/2021   Procedure: AMPUTATION  RIGHT 5TH RAY, INCISION AND DRAINAGE;  Surgeon: Edrick Kins, DPM;  Location: [facility];  Service: Podiatry;  Laterality: Right;   APPLICATION OF WOUND VAC Right 04/21/2021   Procedure: APPLICATION OF WOUND VAC;  Surgeon: Edrick Kins, DPM;  Location: [facility];  Service: Podiatry;  Laterality: Right;   BONE BIOPSY Left 06/27/2020   Procedure: BONE BIOPSY;  Surgeon: Caroline More, DPM;  Location: [facility];  Service: Podiatry;  Laterality: Left;   INCISION AND DRAINAGE Left 06/27/2020   Procedure: INCISION AND DRAINAGE;  Surgeon: Caroline More, DPM;  Location: [facility];  Service: Podiatry;  Laterality: Left;   IRRIGATION AND DEBRIDEMENT FOOT Right 04/21/2021   Procedure: IRRIGATION AND DEBRIDEMENT FOOT;  Surgeon: Edrick Kins, DPM;  Location: [facility];  Service: Podiatry;  Laterality: Right;   LOWER EXTREMITY ANGIOGRAPHY Left 06/30/2020   Procedure: Lower Extremity Angiography;  Surgeon: Algernon Huxley, MD;  Location: [facility];  Service: Cardiovascular;  Laterality: Left;   LOWER EXTREMITY ANGIOGRAPHY Right 04/14/2021   Procedure: Lower Extremity Angiography;  Surgeon: Algernon Huxley, MD;  Location: [facility];  Service: Cardiovascular;  Laterality: Right;   NO PAST SURGERIES     WOUND DEBRIDEMENT Right 04/19/2021   Procedure: DEBRIDEMENT WOUND;  Surgeon: Edrick Kins, DPM;  Location: [facility];  Service: Podiatry;  Laterality: Right;    Family History  Problem Relation Age of Onset  Heart disease Father    Heart attack Father    Healthy Mother    Prostate cancer Neg Hx    Kidney cancer Neg Hx    Bladder Cancer Neg Hx     Social History   Tobacco Use   Smoking status: Never   Smokeless tobacco: Never  Substance Use Topics   Alcohol use: Yes    Alcohol/week: 6.0 - 12.0 standard drinks    Types: 6 - 12 Cans of beer per week    Comment: Occasional     Current Outpatient Medications:    Accu-Chek Softclix Lancets lancets,  SMARTSIG:2 Topical Twice Daily, Disp: , Rfl:    aspirin EC 81 MG tablet, Take 81 mg by mouth daily., Disp: , Rfl:    Blood Glucose Calibration (CONTOUR NEXT CONTROL) Normal SOLN, 1 Bottle by In Vitro route every 30 (thirty) days., Disp: 1 each, Rfl: 3   blood glucose meter kit and supplies, 1 each by Other route in the morning, at noon, and at bedtime. Dispense based on patient and insurance preference. Use up to four times daily as directed. (FOR ICD-10 E10.9, E11.9)., Disp: 1 each, Rfl: 0   cefTRIAXone (ROCEPHIN) IVPB, Inject 2 g into the vein daily. Indication: diabetic foot infection with osteomyelitis First Dose: Yes Last Day of Therapy:  06/02/2021 Labs - Once weekly:  CBC/D, CMP, and CRP Method of administration: IV Push Method of administration may be changed at the discretion of home infusion pharmacist based upon assessment of the patient and/or caregiver's ability to self-administer the medication ordered., Disp: 41 Units, Rfl: 0   Cyanocobalamin (B-12) 1000 MCG SUBL, Place 1 tablet under the tongue daily., Disp: 100 tablet, Rfl: 1   empagliflozin (JARDIANCE) 25 MG TABS tablet, Take 1 tablet (25 mg total) by mouth daily., Disp: 90 tablet, Rfl: 0   glipiZIDE (GLUCOTROL XL) 10 MG 24 hr tablet, TAKE 1 TABLET(10 MG) BY MOUTH DAILY WITH BREAKFAST, Disp: 90 tablet, Rfl: 0   glucose blood (CONTOUR NEXT TEST) test strip, Use as instructed, Disp: 100 each, Rfl: 12   ibuprofen (ADVIL) 600 MG tablet, Take 1 tablet (600 mg total) by mouth every 8 (eight) hours as needed., Disp: 30 tablet, Rfl: 0   nebivolol (BYSTOLIC) 5 MG tablet, TAKE 1 TABLET(5 MG) BY MOUTH DAILY, Disp: 90 tablet, Rfl: 0   pioglitazone (ACTOS) 15 MG tablet, Take 1 tablet (15 mg total) by mouth daily., Disp: 90 tablet, Rfl: 0   rosuvastatin (CRESTOR) 40 MG tablet, TAKE 1 TABLET(40 MG) BY MOUTH DAILY, Disp: 90 tablet, Rfl: 0   torsemide (DEMADEX) 10 MG tablet, Take 10 mg by mouth daily., Disp: , Rfl:    valsartan-hydrochlorothiazide  (DIOVAN-HCT) 320-25 MG tablet, TAKE 1 TABLET BY MOUTH DAILY, Disp: 90 tablet, Rfl: 0   Vitamin D, Ergocalciferol, (DRISDOL) 1.25 MG (50000 UNIT) CAPS capsule, Take 1 capsule (50,000 Units total) by mouth every 7 (seven) days., Disp: 12 capsule, Rfl: 0  No Known Allergies  I personally reviewed {Reviewed:[id number]} with the patient/caregiver today.   ROS  ***  Objective  There were no vitals filed for this visit.  There is no height or weight on file to calculate BMI.  Physical Exam ***  Recent Results (from the past 2160 hour(s))  Lactic acid, plasma     Status: None   Collection Time: 04/12/21  4:31 PM  Result Value Ref Range   Lactic Acid, Venous 1.8 0.5 - 1.9 mmol/L    Comment: Performed at [facility],  [place], [place] [address]  Comprehensive metabolic panel     Status: Abnormal   Collection Time: 04/12/21  4:31 PM  Result Value Ref Range   Sodium 132 (L) 135 - 145 mmol/L   Potassium 3.9 3.5 - 5.1 mmol/L   Chloride 95 (L) 98 - 111 mmol/L   CO2 18 (L) 22 - 32 mmol/L   Glucose, Bld 417 (H) 70 - 99 mg/dL    Comment: Glucose reference range applies only to samples taken after fasting for at least 8 hours.   BUN 53 (H) 6 - 20 mg/dL   Creatinine, Ser 1.38 (H) 0.61 - 1.24 mg/dL   Calcium 9.3 8.9 - 10.3 mg/dL   Total Protein 8.0 6.5 - 8.1 g/dL   Albumin 2.7 (L) 3.5 - 5.0 g/dL   AST 12 (L) 15 - 41 U/L   ALT 11 0 - 44 U/L   Alkaline Phosphatase 86 38 - 126 U/L   Total Bilirubin 3.7 (H) 0.3 - 1.2 mg/dL   GFR, Estimated >60 >60 mL/min    Comment: (NOTE) Calculated using the CKD-EPI Creatinine Equation (2021)    Anion gap 19 (H) 5 - 15    Comment: Performed at [facility], [address] [place], [place] [address]  CBC with Differential     Status: Abnormal   Collection Time: 04/12/21  4:31 PM  Result Value Ref Range   WBC 26.8 (H) 4.0 - 10.5 K/uL   RBC 3.66 (L) 4.22 - 5.81 MIL/uL   Hemoglobin 11.1 (L) 13.0 - 17.0 g/dL   HCT 33.5 (L)  39.0 - 52.0 %   MCV 91.5 80.0 - 100.0 fL   MCH 30.3 26.0 - 34.0 pg   MCHC 33.1 30.0 - 36.0 g/dL   RDW 11.2 (L) 11.5 - 15.5 %   Platelets 420 (H) 150 - 400 K/uL   nRBC 0.0 0.0 - 0.2 %   Neutrophils Relative % 89 %   Neutro Abs 23.7 (H) 1.7 - 7.7 K/uL   Lymphocytes Relative 2 %   Lymphs Abs 0.6 (L) 0.7 - 4.0 K/uL   Monocytes Relative 8 %   Monocytes Absolute 2.1 (H) 0.1 - 1.0 K/uL   Eosinophils Relative 0 %   Eosinophils Absolute 0.0 0.0 - 0.5 K/uL   Basophils Relative 0 %   Basophils Absolute 0.1 0.0 - 0.1 K/uL   Immature Granulocytes 1 %   Abs Immature Granulocytes 0.28 (H) 0.00 - 0.07 K/uL    Comment: Performed at [facility], [address] [place], [place] [address]  SARS CORONAVIRUS 2 (TAT 6-24 HRS) Nasopharyngeal Nasopharyngeal Swab     Status: None   Collection Time: 04/12/21  6:30 PM   Specimen: Nasopharyngeal Swab  Result Value Ref Range   SARS Coronavirus 2 NEGATIVE NEGATIVE    Comment: (NOTE) SARS-CoV-2 target nucleic acids are NOT DETECTED.  The SARS-CoV-2 RNA is generally detectable in upper and lower respiratory specimens during the acute phase of infection. Negative results do not preclude SARS-CoV-2 infection, do not rule out co-infections with other pathogens, and should not be used as the sole basis for treatment or other patient management decisions. Negative results must be combined with clinical observations, patient history, and epidemiological information. The expected result is Negative.  Fact Sheet for Patients: SugarRoll.be  Fact Sheet for Healthcare Providers: https://www.woods-mathews.com/  This test is not yet approved or cleared by the [place] FDA and  has been authorized for detection and/or diagnosis of  SARS-CoV-2 by FDA under an Emergency Use Authorization (EUA). This EUA will remain  in effect (meaning this test can be used) for the duration of the COVID-19 declaration under Se  ction 564(b)(1) of the Act, 21 U.S.C. section 360bbb-3(b)(1), unless the authorization is terminated or revoked sooner.  Performed at [facility], [place] [address], [place], [place] [address]   CBG monitoring, ED     Status: Abnormal   Collection Time: 04/12/21 11:23 PM  Result Value Ref Range   Glucose-Capillary 295 (H) 70 - 99 mg/dL    Comment: Glucose reference range applies only to samples taken after fasting for at least 8 hours.  Lactic acid, plasma     Status: None   Collection Time: 04/12/21 11:39 PM  Result Value Ref Range   Lactic Acid, Venous 1.1 0.5 - 1.9 mmol/L    Comment: Performed at [facility], [address] [place], [place] [address]  Urinalysis, Complete w Microscopic     Status: Abnormal   Collection Time: 04/12/21 11:39 PM  Result Value Ref Range   Color, Urine YELLOW (A) YELLOW   APPearance CLEAR (A) CLEAR   Specific Gravity, Urine 1.023 1.005 - 1.030   pH 5.0 5.0 - 8.0   Glucose, UA >=500 (A) NEGATIVE mg/dL   Hgb urine dipstick SMALL (A) NEGATIVE   Bilirubin Urine NEGATIVE NEGATIVE   Ketones, ur 20 (A) NEGATIVE mg/dL   Protein, ur 30 (A) NEGATIVE mg/dL   Nitrite NEGATIVE NEGATIVE   Leukocytes,Ua NEGATIVE NEGATIVE   RBC / HPF 0-5 0 - 5 RBC/hpf   WBC, UA 0-5 0 - 5 WBC/hpf   Bacteria, UA NONE SEEN NONE SEEN   Squamous Epithelial / LPF 0-5 0 - 5   Mucus PRESENT     Comment: Performed at [facility], [address] [place], [place] [address]  Beta-hydroxybutyric acid     Status: Abnormal   Collection Time: 04/12/21 11:39 PM  Result Value Ref Range   Beta-Hydroxybutyric Acid 5.46 (H) 0.05 - 0.27 mmol/L    Comment: RESULT CONFIRMED BY MANUAL DILUTION SKL Performed at [facility], [address] [place], [place] [address]   Hemoglobin A1c     Status: Abnormal   Collection Time: 04/12/21 11:39 PM  Result Value Ref Range   Hgb A1c MFr Bld 9.9 (H) 4.8 - 5.6 %    Comment: (NOTE) Pre diabetes:           5.7%-6.4%  Diabetes:              >6.4%  Glycemic control for   <7.0% adults with diabetes    Mean Plasma Glucose 237.43 mg/dL    Comment: Performed at [facility] [address] [place], [place] [address]  Comprehensive metabolic panel     Status: Abnormal   Collection Time: 04/13/21  5:11 AM  Result Value Ref Range   Sodium 135 135 - 145 mmol/L   Potassium 3.6 3.5 - 5.1 mmol/L   Chloride 101 98 - 111 mmol/L   CO2 18 (L) 22 - 32 mmol/L   Glucose, Bld 236 (H) 70 - 99 mg/dL    Comment: Glucose reference range applies only to samples taken after fasting for at least 8 hours.   BUN 50 (H) 6 - 20 mg/dL   Creatinine, Ser 1.19 0.61 - 1.24 mg/dL   Calcium 8.6 (L) 8.9 - 10.3 mg/dL   Total Protein 6.6 6.5 - 8.1 g/dL   Albumin  2.0 (L) 3.5 - 5.0 g/dL   AST 8 (L) 15 - 41 U/L   ALT 9 0 - 44 U/L   Alkaline Phosphatase 75 38 - 126 U/L   Total Bilirubin 2.6 (H) 0.3 - 1.2 mg/dL   GFR, Estimated >60 >60 mL/min    Comment: (NOTE) Calculated using the CKD-EPI Creatinine Equation (2021)    Anion gap 16 (H) 5 - 15    Comment: Performed at [facility], [address] [place], [place] [address]  CBC     Status: Abnormal   Collection Time: 04/13/21  5:11 AM  Result Value Ref Range   WBC 25.6 (H) 4.0 - 10.5 K/uL   RBC 3.19 (L) 4.22 - 5.81 MIL/uL   Hemoglobin 9.7 (L) 13.0 - 17.0 g/dL   HCT 29.6 (L) 39.0 - 52.0 %   MCV 92.8 80.0 - 100.0 fL   MCH 30.4 26.0 - 34.0 pg   MCHC 32.8 30.0 - 36.0 g/dL   RDW 11.2 (L) 11.5 - 15.5 %   Platelets 343 150 - 400 K/uL   nRBC 0.0 0.0 - 0.2 %    Comment: Performed at [facility], [address] [place], [place] [address]  Vitamin B12     Status: None   Collection Time: 04/13/21  5:11 AM  Result Value Ref Range   Vitamin B-12 231 180 - 914 pg/mL    Comment: (NOTE) This assay is not validated for testing neonatal or myeloproliferative syndrome specimens for Vitamin B12 levels. Performed at [facility], [place] [address], [place], [place] [address]   Iron and TIBC     Status: Abnormal   Collection Time: 04/13/21  5:11 AM  Result Value Ref Range   Iron 30 (L) 45 - 182 ug/dL   TIBC 168 (L) 250 - 450 ug/dL   Saturation Ratios 18 17.9 - 39.5 %   UIBC 138 ug/dL    Comment: Performed at [facility], [address] [place], [place] [address]  Ferritin     Status: Abnormal   Collection Time: 04/13/21  5:11 AM  Result Value Ref Range   Ferritin 1,118 (H) 24 - 336 ng/mL    Comment: Performed at [facility], [address], [place], [place] [address]  Folate     Status: None   Collection Time: 04/13/21  5:11 AM  Result Value Ref Range   Folate 14.3 >5.9 ng/mL    Comment: Performed at [facility], [address] [place], [place] [address]  Glucose, capillary     Status: Abnormal   Collection Time: 04/13/21  8:21 AM  Result Value Ref Range   Glucose-Capillary 217 (H) 70 - 99 mg/dL    Comment: Glucose reference range applies only to samples taken after fasting for at least 8 hours.  Aerobic Culture w Gram Stain (superficial specimen)     Status: None   Collection Time: 04/13/21  8:56 AM   Specimen: Wound  Result Value Ref Range   Specimen Description WOUND RIGHT FOOT    Special Requests NONE    Gram Stain      FEW WBC PRESENT,BOTH PMN AND MONONUCLEAR FEW GRAM POSITIVE COCCI RARE GRAM VARIABLE ROD    Culture      RARE STREPTOCOCCUS AGALACTIAE TESTING AGAINST S. AGALACTIAE NOT ROUTINELY PERFORMED DUE TO PREDICTABILITY OF AMP/PEN/VAN SUSCEPTIBILITY. WITIHIN MIXED ORGANISMS Performed at [facility], [place] [address] [place], [place] [address]    Report Status  04/17/2021 FINAL   Glucose, capillary     Status: Abnormal   Collection Time: 04/13/21 11:35 AM  Result Value Ref Range   Glucose-Capillary 259 (H) 70 - 99 mg/dL    Comment: Glucose reference range applies only to samples taken after fasting for at least 8 hours.  Glucose, capillary     Status: Abnormal    Collection Time: 04/13/21  4:33 PM  Result Value Ref Range   Glucose-Capillary 228 (H) 70 - 99 mg/dL    Comment: Glucose reference range applies only to samples taken after fasting for at least 8 hours.  Glucose, capillary     Status: Abnormal   Collection Time: 04/13/21  9:19 PM  Result Value Ref Range   Glucose-Capillary 235 (H) 70 - 99 mg/dL    Comment: Glucose reference range applies only to samples taken after fasting for at least 8 hours.  CBC with Differential/Platelet     Status: Abnormal   Collection Time: 04/14/21  5:47 AM  Result Value Ref Range   WBC 28.8 (H) 4.0 - 10.5 K/uL   RBC 3.00 (L) 4.22 - 5.81 MIL/uL   Hemoglobin 9.1 (L) 13.0 - 17.0 g/dL   HCT 27.5 (L) 39.0 - 52.0 %   MCV 91.7 80.0 - 100.0 fL   MCH 30.3 26.0 - 34.0 pg   MCHC 33.1 30.0 - 36.0 g/dL   RDW 11.6 11.5 - 15.5 %   Platelets 361 150 - 400 K/uL   nRBC 0.0 0.0 - 0.2 %   Neutrophils Relative % 87 %   Neutro Abs 24.9 (H) 1.7 - 7.7 K/uL   Lymphocytes Relative 3 %   Lymphs Abs 0.9 0.7 - 4.0 K/uL   Monocytes Relative 8 %   Monocytes Absolute 2.3 (H) 0.1 - 1.0 K/uL   Eosinophils Relative 0 %   Eosinophils Absolute 0.0 0.0 - 0.5 K/uL   Basophils Relative 0 %   Basophils Absolute 0.1 0.0 - 0.1 K/uL   WBC Morphology MORPHOLOGY UNREMARKABLE    RBC Morphology MORPHOLOGY UNREMARKABLE    Smear Review Normal platelet morphology    Immature Granulocytes 2 %   Abs Immature Granulocytes 0.66 (H) 0.00 - 0.07 K/uL    Comment: Performed at [facility], [address] [place], [place] [address]  Basic metabolic panel     Status: Abnormal   Collection Time: 04/14/21  5:47 AM  Result Value Ref Range   Sodium 133 (L) 135 - 145 mmol/L   Potassium 3.1 (L) 3.5 - 5.1 mmol/L   Chloride 101 98 - 111 mmol/L   CO2 20 (L) 22 - 32 mmol/L   Glucose, Bld 171 (H) 70 - 99 mg/dL    Comment: Glucose reference range applies only to samples taken after fasting for at least 8 hours.   BUN 42 (H) 6 - 20 mg/dL   Creatinine,  Ser 1.05 0.61 - 1.24 mg/dL   Calcium 8.6 (L) 8.9 - 10.3 mg/dL   GFR, Estimated >60 >60 mL/min    Comment: (NOTE) Calculated using the CKD-EPI Creatinine Equation (2021)    Anion gap 12 5 - 15    Comment: Performed at [facility], [address] [place], [place] [address]  Magnesium     Status: None   Collection Time: 04/14/21  5:47 AM  Result Value Ref Range   Magnesium 2.2 1.7 - 2.4 mg/dL    Comment: Performed at [facility], [address], [place], [place] [address]  C-reactive protein  Status: Abnormal   Collection Time: 04/14/21  5:47 AM  Result Value Ref Range   CRP 25.4 (H) <1.0 mg/dL    Comment: Performed at [facility] [address] [place], [place] [address]  Glucose, capillary     Status: Abnormal   Collection Time: 04/14/21  7:54 AM  Result Value Ref Range   Glucose-Capillary 181 (H) 70 - 99 mg/dL    Comment: Glucose reference range applies only to samples taken after fasting for at least 8 hours.   Comment 1 Notify RN    Comment 2 Document in Chart   CULTURE, BLOOD (ROUTINE X 2) w Reflex to ID Panel     Status: None   Collection Time: 04/14/21  8:27 AM   Specimen: BLOOD  Result Value Ref Range   Specimen Description BLOOD LEFT HAND    Special Requests      BOTTLES DRAWN AEROBIC AND ANAEROBIC Blood Culture adequate volume   Culture      NO GROWTH 6 DAYS Performed at [facility], [address] [place], [place] [address]    Report Status 04/20/2021 FINAL   CULTURE, BLOOD (ROUTINE X 2) w Reflex to ID Panel     Status: None   Collection Time: 04/14/21  8:28 AM   Specimen: BLOOD  Result Value Ref Range   Specimen Description BLOOD  RIGHT HAND    Special Requests      BOTTLES DRAWN AEROBIC AND ANAEROBIC Blood Culture adequate volume   Culture      NO GROWTH 6 DAYS Performed at [facility], [address], [place], [place] [address]    Report Status 04/20/2021 FINAL   Glucose, capillary     Status:  Abnormal   Collection Time: 04/14/21 12:03 PM  Result Value Ref Range   Glucose-Capillary 159 (H) 70 - 99 mg/dL    Comment: Glucose reference range applies only to samples taken after fasting for at least 8 hours.   Comment 1 Notify RN    Comment 2 Document in Chart   Glucose, capillary     Status: Abnormal   Collection Time: 04/14/21  3:45 PM  Result Value Ref Range   Glucose-Capillary 145 (H) 70 - 99 mg/dL    Comment: Glucose reference range applies only to samples taken after fasting for at least 8 hours.   Comment 1 Notify RN    Comment 2 Document in Chart   Glucose, capillary     Status: Abnormal   Collection Time: 04/14/21  6:29 PM  Result Value Ref Range   Glucose-Capillary 131 (H) 70 - 99 mg/dL    Comment: Glucose reference range applies only to samples taken after fasting for at least 8 hours.  Glucose, capillary     Status: Abnormal   Collection Time: 04/14/21  9:24 PM  Result Value Ref Range   Glucose-Capillary 143 (H) 70 - 99 mg/dL    Comment: Glucose reference range applies only to samples taken after fasting for at least 8 hours.  CBC with Differential/Platelet     Status: Abnormal   Collection Time: 04/15/21  5:27 AM  Result Value Ref Range   WBC 24.3 (H) 4.0 - 10.5 K/uL   RBC 2.94 (L) 4.22 - 5.81 MIL/uL   Hemoglobin 9.0 (L) 13.0 - 17.0 g/dL   HCT 27.0 (L) 39.0 - 52.0 %   MCV 91.8 80.0 - 100.0 fL   MCH 30.6 26.0 - 34.0 pg   MCHC 33.3 30.0 -  36.0 g/dL   RDW 11.7 11.5 - 15.5 %   Platelets 417 (H) 150 - 400 K/uL   nRBC 0.0 0.0 - 0.2 %   Neutrophils Relative % 86 %   Neutro Abs 20.8 (H) 1.7 - 7.7 K/uL   Lymphocytes Relative 4 %   Lymphs Abs 1.0 0.7 - 4.0 K/uL   Monocytes Relative 8 %   Monocytes Absolute 2.0 (H) 0.1 - 1.0 K/uL   Eosinophils Relative 0 %   Eosinophils Absolute 0.0 0.0 - 0.5 K/uL   Basophils Relative 0 %   Basophils Absolute 0.1 0.0 - 0.1 K/uL   Immature Granulocytes 2 %   Abs Immature Granulocytes 0.43 (H) 0.00 - 0.07 K/uL    Comment:  Performed at [facility], [address], [place], [place] [address]  Basic metabolic panel     Status: Abnormal   Collection Time: 04/15/21  5:27 AM  Result Value Ref Range   Sodium 137 135 - 145 mmol/L   Potassium 3.5 3.5 - 5.1 mmol/L   Chloride 107 98 - 111 mmol/L   CO2 21 (L) 22 - 32 mmol/L   Glucose, Bld 130 (H) 70 - 99 mg/dL    Comment: Glucose reference range applies only to samples taken after fasting for at least 8 hours.   BUN 35 (H) 6 - 20 mg/dL   Creatinine, Ser 0.87 0.61 - 1.24 mg/dL   Calcium 8.7 (L) 8.9 - 10.3 mg/dL   GFR, Estimated >60 >60 mL/min    Comment: (NOTE) Calculated using the CKD-EPI Creatinine Equation (2021)    Anion gap 9 5 - 15    Comment: Performed at [facility], [address] [place], [place] [address]  Magnesium     Status: None   Collection Time: 04/15/21  5:27 AM  Result Value Ref Range   Magnesium 2.1 1.7 - 2.4 mg/dL    Comment: Performed at [facility], [address] [place], [place] [address]  C-reactive protein     Status: Abnormal   Collection Time: 04/15/21  5:27 AM  Result Value Ref Range   CRP 26.1 (H) <1.0 mg/dL    Comment: Performed at [facility] [address], [place], [place] [address]  Glucose, capillary     Status: Abnormal   Collection Time: 04/15/21  8:04 AM  Result Value Ref Range   Glucose-Capillary 126 (H) 70 - 99 mg/dL    Comment: Glucose reference range applies only to samples taken after fasting for at least 8 hours.  Glucose, capillary     Status: Abnormal   Collection Time: 04/15/21 11:17 AM  Result Value Ref Range   Glucose-Capillary 177 (H) 70 - 99 mg/dL    Comment: Glucose reference range applies only to samples taken after fasting for at least 8 hours.  Glucose, capillary     Status: Abnormal   Collection Time: 04/15/21  4:43 PM  Result Value Ref Range   Glucose-Capillary 166 (H) 70 - 99 mg/dL    Comment: Glucose reference range applies only to samples taken after  fasting for at least 8 hours.  Glucose, capillary     Status: Abnormal   Collection Time: 04/15/21  9:28 PM  Result Value Ref Range   Glucose-Capillary 173 (H) 70 - 99 mg/dL    Comment: Glucose reference range applies only to samples taken after fasting for at least 8 hours.  CBC with Differential/Platelet     Status: Abnormal   Collection Time:  04/16/21  5:30 AM  Result Value Ref Range   WBC 24.3 (H) 4.0 - 10.5 K/uL   RBC 2.90 (L) 4.22 - 5.81 MIL/uL   Hemoglobin 9.0 (L) 13.0 - 17.0 g/dL   HCT 26.5 (L) 39.0 - 52.0 %   MCV 91.4 80.0 - 100.0 fL   MCH 31.0 26.0 - 34.0 pg   MCHC 34.0 30.0 - 36.0 g/dL   RDW 11.9 11.5 - 15.5 %   Platelets 395 150 - 400 K/uL   nRBC 0.0 0.0 - 0.2 %   Neutrophils Relative % 86 %   Neutro Abs 20.7 (H) 1.7 - 7.7 K/uL   Lymphocytes Relative 4 %   Lymphs Abs 1.0 0.7 - 4.0 K/uL   Monocytes Relative 7 %   Monocytes Absolute 1.8 (H) 0.1 - 1.0 K/uL   Eosinophils Relative 0 %   Eosinophils Absolute 0.0 0.0 - 0.5 K/uL   Basophils Relative 0 %   Basophils Absolute 0.1 0.0 - 0.1 K/uL   Immature Granulocytes 3 %   Abs Immature Granulocytes 0.65 (H) 0.00 - 0.07 K/uL    Comment: Performed at [facility], [address] [place], [place] [address]  Basic metabolic panel     Status: Abnormal   Collection Time: 04/16/21  5:30 AM  Result Value Ref Range   Sodium 136 135 - 145 mmol/L   Potassium 3.3 (L) 3.5 - 5.1 mmol/L   Chloride 107 98 - 111 mmol/L   CO2 21 (L) 22 - 32 mmol/L   Glucose, Bld 141 (H) 70 - 99 mg/dL    Comment: Glucose reference range applies only to samples taken after fasting for at least 8 hours.   BUN 30 (H) 6 - 20 mg/dL   Creatinine, Ser 0.93 0.61 - 1.24 mg/dL   Calcium 8.5 (L) 8.9 - 10.3 mg/dL   GFR, Estimated >60 >60 mL/min    Comment: (NOTE) Calculated using the CKD-EPI Creatinine Equation (2021)    Anion gap 8 5 - 15    Comment: Performed at [facility], [address] [place], [place] [address]  Magnesium      Status: None   Collection Time: 04/16/21  5:30 AM  Result Value Ref Range   Magnesium 1.9 1.7 - 2.4 mg/dL    Comment: Performed at [facility], [address] [place], [place] [address]  C-reactive protein     Status: Abnormal   Collection Time: 04/16/21  5:30 AM  Result Value Ref Range   CRP 21.3 (H) <1.0 mg/dL    Comment: Performed at [facility] [address], [place], [place] [address]  Glucose, capillary     Status: Abnormal   Collection Time: 04/16/21  8:14 AM  Result Value Ref Range   Glucose-Capillary 146 (H) 70 - 99 mg/dL    Comment: Glucose reference range applies only to samples taken after fasting for at least 8 hours.  Glucose, capillary     Status: Abnormal   Collection Time: 04/16/21 11:47 AM  Result Value Ref Range   Glucose-Capillary 131 (H) 70 - 99 mg/dL    Comment: Glucose reference range applies only to samples taken after fasting for at least 8 hours.  Surgical pathology     Status: None   Collection Time: 04/16/21  1:46 PM  Result Value Ref Range   SURGICAL PATHOLOGY      SURGICAL PATHOLOGY CASE: [id number] PATIENT: [facility] Surgical Pathology Report     Specimen Submitted: A.  5th ray, right foot  Clinical History: Right foot osteomyelitis    DIAGNOSIS: A. FOOT, RIGHT FIFTH RAY; AMPUTATION: - ACUTE OSTEOMYELITIS. - SKIN AND SUBCUTANEOUS TISSUE WITH ULCERATION AND ACUTE SUPPURATIVE INFLAMMATION, EXTENDING TO INKED MARGIN. - BONY MARGIN (ARTICULAR SURFACE) FREE OF ACUTE INFLAMMATION.  GROSS DESCRIPTION: A. Labeled: Right fifth ray Received: Formalin Collection time: 2:12 PM on 04/16/2021 Placed into formalin time: 2:12 PM on 04/16/2021 Size: 12.4 x 3.7 x 3.4 cm Description of lesion(s): Received is a toe and lateral portion of foot. The toe has a toenail.  The lateral aspect of the foot has a 6.4 x 2.6 cm area of sloughing tan skin with scattered areas of brown discoloration.  At the distal aspect of the area of  sloughing, there is a 1.5 x 0.6 cm shallow area of ulceration.  The remaining skin is tan-brown and smooth. P roximal margin: The proximal margin is inked blue.  The area of sloughing skin and ulceration are focally abutting the closest skin and soft tissue resection margins.  Additionally, soft tissue resection margins have scattered areas of tan purulent exudate.  The bone at the resection margin is disarticulated and firm. Bone: The bone is diffusely tan, firm, and grossly unremarkable.  No distinct areas of softening are grossly appreciated. Other findings: None grossly identified.  Block summary: 1 - 2 - disarticulated bone at resection margin, perpendicularly sectioned and submitted entirely 3 - representative sloughing skin discoloration and ulceration perpendicularly sectioned to resection margin 4 - representative purulent exudate at resection margin 5 - representative bone underlying area of skin discoloration (distal aspect of specimen) 6 - representative bone underlying area of ulceration (proximal aspect of specimen)  Tissue decalcification: Yes, cassettes 1, 2 , 5, and 6  RB 04/18/2021   Final Diagnosis performed by Allena Napoleon, MD.   Electronically signed 04/20/2021 2:27:47PM The electronic signature indicates that the named Attending Pathologist has evaluated the specimen Technical component performed at [facility], [address] [place], [place] [address] Lab: [phone] Dir: Rush Farmer, MD, MMM  Professional component performed at [facility], [facility], [place], [place], [place] [address] Lab: [phone] Dir: Dellia Nims. Rubinas, MD   Aerobic/Anaerobic Culture w Gram Stain (surgical/deep wound)     Status: None   Collection Time: 04/16/21  1:47 PM   Specimen: PATH Other; Abscess  Result Value Ref Range   Specimen Description ABSCESS RIGHT FOOT    Special Requests SWAB SPEC A    Gram Stain      ABUNDANT WBC PRESENT, PREDOMINANTLY  PMN RARE GRAM POSITIVE COCCI IN PAIRS    Culture      No growth aerobically or anaerobically. Performed at [facility], [place] [address], [place], [place] [address]    Report Status 04/21/2021 FINAL   Glucose, capillary     Status: Abnormal   Collection Time: 04/16/21  2:38 PM  Result Value Ref Range   Glucose-Capillary 131 (H) 70 - 99 mg/dL    Comment: Glucose reference range applies only to samples taken after fasting for at least 8 hours.  Glucose, capillary     Status: Abnormal   Collection Time: 04/16/21  4:12 PM  Result Value Ref Range   Glucose-Capillary 155 (H) 70 - 99 mg/dL    Comment: Glucose reference range applies only to samples taken after fasting for at least 8 hours.  Glucose, capillary     Status: Abnormal   Collection Time: 04/16/21  9:05 PM  Result Value Ref Range  Glucose-Capillary 276 (H) 70 - 99 mg/dL    Comment: Glucose reference range applies only to samples taken after fasting for at least 8 hours.   Comment 1 Notify RN    Comment 2 Document in Chart   CBC with Differential/Platelet     Status: Abnormal   Collection Time: 04/17/21  5:35 AM  Result Value Ref Range   WBC 27.8 (H) 4.0 - 10.5 K/uL   RBC 2.67 (L) 4.22 - 5.81 MIL/uL   Hemoglobin 8.2 (L) 13.0 - 17.0 g/dL   HCT 25.3 (L) 39.0 - 52.0 %   MCV 94.8 80.0 - 100.0 fL   MCH 30.7 26.0 - 34.0 pg   MCHC 32.4 30.0 - 36.0 g/dL   RDW 12.1 11.5 - 15.5 %   Platelets 397 150 - 400 K/uL   nRBC 0.0 0.0 - 0.2 %   Neutrophils Relative % 88 %   Neutro Abs 24.5 (H) 1.7 - 7.7 K/uL   Lymphocytes Relative 3 %   Lymphs Abs 0.8 0.7 - 4.0 K/uL   Monocytes Relative 6 %   Monocytes Absolute 1.8 (H) 0.1 - 1.0 K/uL   Eosinophils Relative 0 %   Eosinophils Absolute 0.0 0.0 - 0.5 K/uL   Basophils Relative 0 %   Basophils Absolute 0.1 0.0 - 0.1 K/uL   WBC Morphology MORPHOLOGY UNREMARKABLE    RBC Morphology MORPHOLOGY UNREMARKABLE    Smear Review Reviewed    Immature Granulocytes 3 %   Abs Immature Granulocytes  0.75 (H) 0.00 - 0.07 K/uL    Comment: Performed at [facility], [address] [place], [place] [address]  Basic metabolic panel     Status: Abnormal   Collection Time: 04/17/21  5:35 AM  Result Value Ref Range   Sodium 135 135 - 145 mmol/L   Potassium 4.1 3.5 - 5.1 mmol/L   Chloride 107 98 - 111 mmol/L   CO2 18 (L) 22 - 32 mmol/L   Glucose, Bld 251 (H) 70 - 99 mg/dL    Comment: Glucose reference range applies only to samples taken after fasting for at least 8 hours.   BUN 32 (H) 6 - 20 mg/dL   Creatinine, Ser 0.99 0.61 - 1.24 mg/dL   Calcium 8.4 (L) 8.9 - 10.3 mg/dL   GFR, Estimated >60 >60 mL/min    Comment: (NOTE) Calculated using the CKD-EPI Creatinine Equation (2021)    Anion gap 10 5 - 15    Comment: Performed at [facility], [address] [place], [place] [address]  Magnesium     Status: None   Collection Time: 04/17/21  5:35 AM  Result Value Ref Range   Magnesium 2.0 1.7 - 2.4 mg/dL    Comment: Performed at [facility], [address] [place], [place] [address]  C-reactive protein     Status: Abnormal   Collection Time: 04/17/21  5:35 AM  Result Value Ref Range   CRP 20.8 (H) <1.0 mg/dL    Comment: Performed at [facility] [address], [place], [place] [address]  Glucose, capillary     Status: Abnormal   Collection Time: 04/17/21  7:52 AM  Result Value Ref Range   Glucose-Capillary 218 (H) 70 - 99 mg/dL    Comment: Glucose reference range applies only to samples taken after fasting for at least 8 hours.  Glucose, capillary     Status: Abnormal   Collection Time: 04/17/21 12:03 PM  Result Value Ref Range  Glucose-Capillary 216 (H) 70 - 99 mg/dL    Comment: Glucose reference range applies only to samples taken after fasting for at least 8 hours.  Vancomycin, peak     Status: None   Collection Time: 04/17/21  2:47 PM  Result Value Ref Range   Vancomycin Pk 40 30 - 40 ug/mL    Comment: Performed at [facility],  [address] [place], [place] [address]  Glucose, capillary     Status: Abnormal   Collection Time: 04/17/21  3:58 PM  Result Value Ref Range   Glucose-Capillary 221 (H) 70 - 99 mg/dL    Comment: Glucose reference range applies only to samples taken after fasting for at least 8 hours.  Glucose, capillary     Status: Abnormal   Collection Time: 04/17/21  9:21 PM  Result Value Ref Range   Glucose-Capillary 287 (H) 70 - 99 mg/dL    Comment: Glucose reference range applies only to samples taken after fasting for at least 8 hours.  Vancomycin, trough     Status: Abnormal   Collection Time: 04/17/21 11:07 PM  Result Value Ref Range   Vancomycin Tr 31 (HH) 15 - 20 ug/mL    Comment: CRITICAL RESULT CALLED TO, READ BACK BY AND VERIFIED WITH NATHAN BELUE AT 2354 04/17/21 MF Performed at [facility], [address], [place], [place] [address]   Basic metabolic panel     Status: Abnormal   Collection Time: 04/18/21  5:28 AM  Result Value Ref Range   Sodium 134 (L) 135 - 145 mmol/L   Potassium 3.5 3.5 - 5.1 mmol/L   Chloride 105 98 - 111 mmol/L   CO2 20 (L) 22 - 32 mmol/L   Glucose, Bld 226 (H) 70 - 99 mg/dL    Comment: Glucose reference range applies only to samples taken after fasting for at least 8 hours.   BUN 40 (H) 6 - 20 mg/dL   Creatinine, Ser 1.07 0.61 - 1.24 mg/dL   Calcium 8.5 (L) 8.9 - 10.3 mg/dL   GFR, Estimated >60 >60 mL/min    Comment: (NOTE) Calculated using the CKD-EPI Creatinine Equation (2021)    Anion gap 9 5 - 15    Comment: Performed at [facility], [address] [place], [place] [address]  CBC with Differential/Platelet     Status: Abnormal   Collection Time: 04/18/21  5:28 AM  Result Value Ref Range   WBC 21.5 (H) 4.0 - 10.5 K/uL   RBC 2.95 (L) 4.22 - 5.81 MIL/uL   Hemoglobin 9.1 (L) 13.0 - 17.0 g/dL   HCT 27.7 (L) 39.0 - 52.0 %   MCV 93.9 80.0 - 100.0 fL   MCH 30.8 26.0 - 34.0 pg   MCHC 32.9 30.0 - 36.0 g/dL   RDW 12.2 11.5 - 15.5  %   Platelets 538 (H) 150 - 400 K/uL   nRBC 0.1 0.0 - 0.2 %   Neutrophils Relative % 83 %   Neutro Abs 17.8 (H) 1.7 - 7.7 K/uL   Lymphocytes Relative 6 %   Lymphs Abs 1.3 0.7 - 4.0 K/uL   Monocytes Relative 7 %   Monocytes Absolute 1.5 (H) 0.1 - 1.0 K/uL   Eosinophils Relative 0 %   Eosinophils Absolute 0.0 0.0 - 0.5 K/uL   Basophils Relative 0 %   Basophils Absolute 0.1 0.0 - 0.1 K/uL   Immature Granulocytes 4 %   Abs Immature Granulocytes 0.76 (H) 0.00 - 0.07 K/uL  Comment: Performed at [facility], [address] [place], [place] [address]  Magnesium     Status: None   Collection Time: 04/18/21  5:28 AM  Result Value Ref Range   Magnesium 2.0 1.7 - 2.4 mg/dL    Comment: Performed at [facility], [place], [place] [address]  C-reactive protein     Status: Abnormal   Collection Time: 04/18/21  5:28 AM  Result Value Ref Range   CRP 14.8 (H) <1.0 mg/dL    Comment: Performed at [facility] [address], [place], [place] [address]  Glucose, capillary     Status: Abnormal   Collection Time: 04/18/21  7:26 AM  Result Value Ref Range   Glucose-Capillary 205 (H) 70 - 99 mg/dL    Comment: Glucose reference range applies only to samples taken after fasting for at least 8 hours.  Glucose, capillary     Status: Abnormal   Collection Time: 04/18/21 12:53 PM  Result Value Ref Range   Glucose-Capillary 239 (H) 70 - 99 mg/dL    Comment: Glucose reference range applies only to samples taken after fasting for at least 8 hours.  Glucose, capillary     Status: Abnormal   Collection Time: 04/18/21  4:39 PM  Result Value Ref Range   Glucose-Capillary 171 (H) 70 - 99 mg/dL    Comment: Glucose reference range applies only to samples taken after fasting for at least 8 hours.   Comment 1 Notify RN   Glucose, capillary     Status: Abnormal   Collection Time: 04/18/21  8:52 PM  Result Value Ref Range   Glucose-Capillary 223 (H) 70 - 99 mg/dL     Comment: Glucose reference range applies only to samples taken after fasting for at least 8 hours.   Comment 1 Notify RN   Basic metabolic panel     Status: Abnormal   Collection Time: 04/19/21  3:12 AM  Result Value Ref Range   Sodium 135 135 - 145 mmol/L   Potassium 3.2 (L) 3.5 - 5.1 mmol/L   Chloride 108 98 - 111 mmol/L   CO2 21 (L) 22 - 32 mmol/L   Glucose, Bld 136 (H) 70 - 99 mg/dL    Comment: Glucose reference range applies only to samples taken after fasting for at least 8 hours.   BUN 29 (H) 6 - 20 mg/dL   Creatinine, Ser 0.91 0.61 - 1.24 mg/dL   Calcium 8.4 (L) 8.9 - 10.3 mg/dL   GFR, Estimated >60 >60 mL/min    Comment: (NOTE) Calculated using the CKD-EPI Creatinine Equation (2021)    Anion gap 6 5 - 15    Comment: Performed at [facility] [address] [place], [place] [address]  CBC with Differential/Platelet     Status: Abnormal   Collection Time: 04/19/21  3:12 AM  Result Value Ref Range   WBC 17.9 (H) 4.0 - 10.5 K/uL   RBC 2.45 (L) 4.22 - 5.81 MIL/uL   Hemoglobin 7.5 (L) 13.0 - 17.0 g/dL   HCT 22.4 (L) 39.0 - 52.0 %   MCV 91.4 80.0 - 100.0 fL   MCH 30.6 26.0 - 34.0 pg   MCHC 33.5 30.0 - 36.0 g/dL   RDW 12.4 11.5 - 15.5 %   Platelets 471 (H) 150 - 400 K/uL   nRBC 0.0 0.0 - 0.2 %   Neutrophils Relative % 80 %   Neutro Abs 14.2 (H) 1.7 - 7.7 K/uL  Lymphocytes Relative 8 %   Lymphs Abs 1.4 0.7 - 4.0 K/uL   Monocytes Relative 9 %   Monocytes Absolute 1.7 (H) 0.1 - 1.0 K/uL   Eosinophils Relative 0 %   Eosinophils Absolute 0.0 0.0 - 0.5 K/uL   Basophils Relative 0 %   Basophils Absolute 0.0 0.0 - 0.1 K/uL   Immature Granulocytes 3 %   Abs Immature Granulocytes 0.49 (H) 0.00 - 0.07 K/uL    Comment: Performed at [facility], [address], [place], [place] [address]  Magnesium     Status: None   Collection Time: 04/19/21  3:12 AM  Result Value Ref Range   Magnesium 1.8 1.7 - 2.4 mg/dL    Comment: Performed at [facility],  [address] [place], [place] [address]  C-reactive protein     Status: Abnormal   Collection Time: 04/19/21  3:12 AM  Result Value Ref Range   CRP 12.0 (H) <1.0 mg/dL    Comment: Performed at [facility], [place] [address] [name], [place] [address]  Glucose, capillary     Status: Abnormal   Collection Time: 04/19/21  8:04 AM  Result Value Ref Range   Glucose-Capillary 112 (H) 70 - 99 mg/dL    Comment: Glucose reference range applies only to samples taken after fasting for at least 8 hours.  Glucose, capillary     Status: Abnormal   Collection Time: 04/19/21 11:59 AM  Result Value Ref Range   Glucose-Capillary 126 (H) 70 - 99 mg/dL    Comment: Glucose reference range applies only to samples taken after fasting for at least 8 hours.  Glucose, capillary     Status: Abnormal   Collection Time: 04/19/21  1:09 PM  Result Value Ref Range   Glucose-Capillary 123 (H) 70 - 99 mg/dL    Comment: Glucose reference range applies only to samples taken after fasting for at least 8 hours.  Glucose, capillary     Status: Abnormal   Collection Time: 04/19/21  3:49 PM  Result Value Ref Range   Glucose-Capillary 183 (H) 70 - 99 mg/dL    Comment: Glucose reference range applies only to samples taken after fasting for at least 8 hours.  Glucose, capillary     Status: Abnormal   Collection Time: 04/19/21  9:18 PM  Result Value Ref Range   Glucose-Capillary 298 (H) 70 - 99 mg/dL    Comment: Glucose reference range applies only to samples taken after fasting for at least 8 hours.   Comment 1 Notify RN   Basic metabolic panel     Status: Abnormal   Collection Time: 04/20/21  6:07 AM  Result Value Ref Range   Sodium 136 135 - 145 mmol/L   Potassium 3.8 3.5 - 5.1 mmol/L   Chloride 108 98 - 111 mmol/L   CO2 21 (L) 22 - 32 mmol/L   Glucose, Bld 236 (H) 70 - 99 mg/dL    Comment: Glucose reference range applies only to samples taken after fasting for at least 8 hours.   BUN 23 (H) 6 - 20 mg/dL    Creatinine, Ser 0.88 0.61 - 1.24 mg/dL   Calcium 8.1 (L) 8.9 - 10.3 mg/dL   GFR, Estimated >60 >60 mL/min    Comment: (NOTE) Calculated using the CKD-EPI Creatinine Equation (2021)    Anion gap 7 5 - 15    Comment: Performed at [facility], [address], [place], [place] [address]  CBC with  Differential/Platelet     Status: Abnormal   Collection Time: 04/20/21  6:07 AM  Result Value Ref Range   WBC 16.3 (H) 4.0 - 10.5 K/uL   RBC 2.48 (L) 4.22 - 5.81 MIL/uL   Hemoglobin 7.3 (L) 13.0 - 17.0 g/dL   HCT 23.7 (L) 39.0 - 52.0 %   MCV 95.6 80.0 - 100.0 fL   MCH 29.4 26.0 - 34.0 pg   MCHC 30.8 30.0 - 36.0 g/dL   RDW 12.5 11.5 - 15.5 %   Platelets 428 (H) 150 - 400 K/uL   nRBC 0.0 0.0 - 0.2 %   Neutrophils Relative % 83 %   Neutro Abs 13.6 (H) 1.7 - 7.7 K/uL   Lymphocytes Relative 7 %   Lymphs Abs 1.1 0.7 - 4.0 K/uL   Monocytes Relative 7 %   Monocytes Absolute 1.1 (H) 0.1 - 1.0 K/uL   Eosinophils Relative 0 %   Eosinophils Absolute 0.0 0.0 - 0.5 K/uL   Basophils Relative 0 %   Basophils Absolute 0.0 0.0 - 0.1 K/uL   Immature Granulocytes 3 %   Abs Immature Granulocytes 0.40 (H) 0.00 - 0.07 K/uL    Comment: Performed at [facility], [address] [place], [place] [address]  Magnesium     Status: None   Collection Time: 04/20/21  6:07 AM  Result Value Ref Range   Magnesium 2.0 1.7 - 2.4 mg/dL    Comment: Performed at [facility], [address] [place], [place] [address]  C-reactive protein     Status: Abnormal   Collection Time: 04/20/21  6:07 AM  Result Value Ref Range   CRP 11.3 (H) <1.0 mg/dL    Comment: Performed at [facility], [address], [place], [place] [address]  Glucose, capillary     Status: Abnormal   Collection Time: 04/20/21  8:36 AM  Result Value Ref Range   Glucose-Capillary 226 (H) 70 - 99 mg/dL    Comment: Glucose reference range applies only to samples taken after fasting for at least 8 hours.  Glucose,  capillary     Status: Abnormal   Collection Time: 04/20/21  1:05 PM  Result Value Ref Range   Glucose-Capillary 176 (H) 70 - 99 mg/dL    Comment: Glucose reference range applies only to samples taken after fasting for at least 8 hours.  Glucose, capillary     Status: Abnormal   Collection Time: 04/20/21  4:37 PM  Result Value Ref Range   Glucose-Capillary 145 (H) 70 - 99 mg/dL    Comment: Glucose reference range applies only to samples taken after fasting for at least 8 hours.  Glucose, capillary     Status: Abnormal   Collection Time: 04/20/21  9:02 PM  Result Value Ref Range   Glucose-Capillary 108 (H) 70 - 99 mg/dL    Comment: Glucose reference range applies only to samples taken after fasting for at least 8 hours.   Comment 1 Notify RN   Basic metabolic panel     Status: Abnormal   Collection Time: 04/21/21  5:27 AM  Result Value Ref Range   Sodium 137 135 - 145 mmol/L   Potassium 3.6 3.5 - 5.1 mmol/L   Chloride 108 98 - 111 mmol/L   CO2 20 (L) 22 - 32 mmol/L   Glucose, Bld 65 (L) 70 - 99 mg/dL    Comment: Glucose reference range applies only to samples taken after fasting for at least 8 hours.  BUN 17 6 - 20 mg/dL   Creatinine, Ser 0.73 0.61 - 1.24 mg/dL   Calcium 8.2 (L) 8.9 - 10.3 mg/dL   GFR, Estimated >60 >60 mL/min    Comment: (NOTE) Calculated using the CKD-EPI Creatinine Equation (2021)    Anion gap 9 5 - 15    Comment: Performed at [facility], [address] [place],  [address]  CBC with Differential/Platelet     Status: Abnormal   Collection Time: 04/21/21  5:27 AM  Result Value Ref Range   WBC 15.8 (H) 4.0 - 10.5 K/uL   RBC 2.48 (L) 4.22 - 5.81 MIL/uL   Hemoglobin 7.6 (L) 13.0 - 17.0 g/dL   HCT 23.5 (L) 39.0 - 52.0 %   MCV 94.8 80.0 - 100.0 fL   MCH 30.6 26.0 - 34.0 pg   MCHC 32.3 30.0 - 36.0 g/dL   RDW 12.5 11.5 - 15.5 %   Platelets 464 (H) 150 - 400 K/uL   nRBC 0.0 0.0 - 0.2 %   Neutrophils Relative % 79 %   Neutro Abs 12.4 (H) 1.7  - 7.7 K/uL   Lymphocytes Relative 10 %   Lymphs Abs 1.5 0.7 - 4.0 K/uL   Monocytes Relative 9 %   Monocytes Absolute 1.4 (H) 0.1 - 1.0 K/uL   Eosinophils Relative 0 %   Eosinophils Absolute 0.0 0.0 - 0.5 K/uL   Basophils Relative 0 %   Basophils Absolute 0.0 0.0 - 0.1 K/uL   Immature Granulocytes 2 %   Abs Immature Granulocytes 0.33 (H) 0.00 - 0.07 K/uL    Comment: Performed at [facility], [address] [place], [place] [address]  Glucose, capillary     Status: None   Collection Time: 04/21/21  8:19 AM  Result Value Ref Range   Glucose-Capillary 78 70 - 99 mg/dL    Comment: Glucose reference range applies only to samples taken after fasting for at least 8 hours.  Glucose, capillary     Status: None   Collection Time: 04/21/21 11:52 AM  Result Value Ref Range   Glucose-Capillary 79 70 - 99 mg/dL    Comment: Glucose reference range applies only to samples taken after fasting for at least 8 hours.  Glucose, capillary     Status: None   Collection Time: 04/21/21  1:35 PM  Result Value Ref Range   Glucose-Capillary 85 70 - 99 mg/dL    Comment: Glucose reference range applies only to samples taken after fasting for at least 8 hours.  Glucose, capillary     Status: None   Collection Time: 04/21/21  4:32 PM  Result Value Ref Range   Glucose-Capillary 95 70 - 99 mg/dL    Comment: Glucose reference range applies only to samples taken after fasting for at least 8 hours.  Glucose, capillary     Status: Abnormal   Collection Time: 04/21/21  8:56 PM  Result Value Ref Range   Glucose-Capillary 167 (H) 70 - 99 mg/dL    Comment: Glucose reference range applies only to samples taken after fasting for at least 8 hours.   Comment 1 Notify RN   CBC with Differential/Platelet     Status: Abnormal   Collection Time: 04/22/21  5:59 AM  Result Value Ref Range   WBC 11.3 (H) 4.0 - 10.5 K/uL   RBC 2.40 (L) 4.22 - 5.81 MIL/uL   Hemoglobin 7.3 (L) 13.0 - 17.0 g/dL   HCT 23.0 (L) 39.0 -  52.0 %  MCV 95.8 80.0 - 100.0 fL   MCH 30.4 26.0 - 34.0 pg   MCHC 31.7 30.0 - 36.0 g/dL   RDW 12.8 11.5 - 15.5 %   Platelets 404 (H) 150 - 400 K/uL   nRBC 0.0 0.0 - 0.2 %   Neutrophils Relative % 82 %   Neutro Abs 9.2 (H) 1.7 - 7.7 K/uL   Lymphocytes Relative 8 %   Lymphs Abs 0.9 0.7 - 4.0 K/uL   Monocytes Relative 8 %   Monocytes Absolute 0.9 0.1 - 1.0 K/uL   Eosinophils Relative 0 %   Eosinophils Absolute 0.0 0.0 - 0.5 K/uL   Basophils Relative 0 %   Basophils Absolute 0.0 0.0 - 0.1 K/uL   Immature Granulocytes 2 %   Abs Immature Granulocytes 0.17 (H) 0.00 - 0.07 K/uL    Comment: Performed at [facility], [address], [place], [place] [address]  Basic metabolic panel     Status: Abnormal   Collection Time: 04/22/21  5:59 AM  Result Value Ref Range   Sodium 137 135 - 145 mmol/L   Potassium 4.0 3.5 - 5.1 mmol/L   Chloride 111 98 - 111 mmol/L   CO2 20 (L) 22 - 32 mmol/L   Glucose, Bld 250 (H) 70 - 99 mg/dL    Comment: Glucose reference range applies only to samples taken after fasting for at least 8 hours.   BUN 17 6 - 20 mg/dL   Creatinine, Ser 0.85 0.61 - 1.24 mg/dL   Calcium 7.9 (L) 8.9 - 10.3 mg/dL   GFR, Estimated >60 >60 mL/min    Comment: (NOTE) Calculated using the CKD-EPI Creatinine Equation (2021)    Anion gap 6 5 - 15    Comment: Performed at [facility], [address] [place],  [address]  Glucose, capillary     Status: Abnormal   Collection Time: 04/22/21  8:05 AM  Result Value Ref Range   Glucose-Capillary 243 (H) 70 - 99 mg/dL    Comment: Glucose reference range applies only to samples taken after fasting for at least 8 hours.  Glucose, capillary     Status: Abnormal   Collection Time: 04/22/21 12:48 PM  Result Value Ref Range   Glucose-Capillary 266 (H) 70 - 99 mg/dL    Comment: Glucose reference range applies only to samples taken after fasting for at least 8 hours.  CBC with Differential/Platelet     Status: Abnormal    Collection Time: 04/29/21 10:25 AM  Result Value Ref Range   WBC 8.0 3.4 - 10.8 x10E3/uL   RBC 3.09 (L) 4.14 - 5.80 x10E6/uL   Hemoglobin 9.2 (L) 13.0 - 17.7 g/dL   Hematocrit 28.9 (L) 37.5 - 51.0 %   MCV 94 79 - 97 fL   MCH 29.8 26.6 - 33.0 pg   MCHC 31.8 31.5 - 35.7 g/dL   RDW 12.8 11.6 - 15.4 %   Platelets 379 150 - 450 x10E3/uL   Neutrophils 82 Not Estab. %   Lymphs 10 Not Estab. %   Monocytes 7 Not Estab. %   Eos 0 Not Estab. %   Basos 0 Not Estab. %   Neutrophils Absolute 6.6 1.4 - 7.0 x10E3/uL   Lymphocytes Absolute 0.8 0.7 - 3.1 x10E3/uL   Monocytes Absolute 0.5 0.1 - 0.9 x10E3/uL   EOS (ABSOLUTE) 0.0 0.0 - 0.4 x10E3/uL   Basophils Absolute 0.0 0.0 - 0.2 x10E3/uL   Immature Granulocytes 1 Not Estab. %   Immature  Grans (Abs) 0.1 0.0 - 0.1 x10E3/uL  Comprehensive metabolic panel     Status: Abnormal   Collection Time: 04/29/21 10:25 AM  Result Value Ref Range   Glucose 236 (H) 65 - 99 mg/dL   BUN 19 6 - 24 mg/dL   Creatinine, Ser 0.93 0.76 - 1.27 mg/dL   eGFR 98 >59 mL/min/1.73   BUN/Creatinine Ratio 20 9 - 20   Sodium 139 134 - 144 mmol/L   Potassium 3.6 3.5 - 5.2 mmol/L   Chloride 100 96 - 106 mmol/L   CO2 21 20 - 29 mmol/L   Calcium 9.1 8.7 - 10.2 mg/dL   Total Protein 6.7 6.0 - 8.5 g/dL   Albumin 3.1 (L) 3.8 - 4.9 g/dL   Globulin, Total 3.6 1.5 - 4.5 g/dL   Albumin/Globulin Ratio 0.9 (L) 1.2 - 2.2   Bilirubin Total 0.3 0.0 - 1.2 mg/dL   Alkaline Phosphatase 86 44 - 121 IU/L   AST 9 0 - 40 IU/L   ALT 12 0 - 44 IU/L    Diabetic Foot Exam: Diabetic Foot Exam - Simple   No data filed    ***  PHQ2/9: Depression screen [facility] 2/9 04/28/2021 02/09/2021 10/11/2020 02/27/2020 11/26/2019  Decreased Interest 0 0 0 0 0  Down, Depressed, Hopeless 0 0 0 0 0  PHQ - 2 Score 0 0 0 0 0  Altered sleeping - - - 0 0  Tired, decreased energy - - - 0 0  Change in appetite - - - 0 0  Feeling bad or failure about yourself  - - - 0 0  Trouble concentrating - - - 0 0  Moving  slowly or fidgety/restless - - - 0 0  Suicidal thoughts - - - 0 0  PHQ-9 Score - - - 0 0  Difficult doing work/chores - - - Not difficult at all -    phq 9 is {gen pos [id number]}  Fall Risk: Fall Risk  04/28/2021 02/09/2021 10/11/2020 02/27/2020 11/26/2019  Falls in the past year? 0 0 0 0 0  Number falls in past yr: 0 0 0 0 0  Injury with Fall? 0 0 0 0 0   ***   Functional Status Survey:     Assessment & Plan  There are no diagnoses linked to this encounter.

## 2021-05-31 ENCOUNTER — Telehealth: Payer: Self-pay

## 2021-05-31 ENCOUNTER — Telehealth: Payer: Self-pay | Admitting: Podiatry

## 2021-05-31 NOTE — Telephone Encounter (Signed)
Infectious Disease contacted [place] to say his glucose was over 500, he no showed his 05/23/21 appointment. Per Dr. Ancil Boozer he needs a follow up. Please schedule, thank you!

## 2021-05-31 NOTE — Telephone Encounter (Signed)
Sent DR. Ola Spurr a message through epic to have him call me. If he calls the office again, please get a call back number. - Dr. Amalia Hailey

## 2021-05-31 NOTE — Telephone Encounter (Signed)
Pt has an appt with Dr Ky Barban on Monday 06/06/21

## 2021-05-31 NOTE — Telephone Encounter (Signed)
Dr.Fitzgerald with infectious disease called and stated he needed Dr. Amalia Hailey to call to discuss this case.

## 2021-06-01 ENCOUNTER — Telehealth: Payer: Self-pay | Admitting: *Deleted

## 2021-06-01 NOTE — Telephone Encounter (Signed)
Spoke with Dr. Ola Spurr. Thanks, Dr. Amalia Hailey

## 2021-06-01 NOTE — Telephone Encounter (Signed)
"  His foot is getting worse.  There's new wounds that are developing on top of the foot.  I'm not sure what to do about those.  I feel that the wound vac, the drape is keeping it too moist and wet.  The skin around the wound, even with the Betadine, is still grey and macerated.  Just from Medicare standards, you have to show some kind of improvement every two to three weeks or you have to change the wound care.  I don't know what to do about this wound.  I need some advice or suggestions that anyone has to give.

## 2021-06-02 NOTE — Telephone Encounter (Signed)
Spoke with patient. They are scheduled to come in Tuesday. It sounds like it is just maceration of skin coming from the wound vac. Recommend that if there is a concern for acute infection to go immediately to ED. At that point he will be looking at [place]. Thanks, Dr. Amalia Hailey

## 2021-06-03 ENCOUNTER — Telehealth: Payer: Self-pay | Admitting: *Deleted

## 2021-06-03 NOTE — Telephone Encounter (Signed)
"  I've already left one message about [name].  He belongs to Dr. Amalia Hailey.  I really need to talk to someone about his wound.  It looks really bad.  The wound vac is doing nothing but keeping it wetter.  So, I just applied wet to dry.  I did it the other day and I'm going to do it again today.  More wounds are popping up.  Please give me a call."  I am returning your call.  Dr. Amalia Hailey spoke to the patient on 06/02/2021.  He said he spoke with the patient. He said he's scheduled to come in on Tuesday.  He said it sounds like it is just maceration of skin coming from the wound vac. He recommended that if there is a concern for acute infection to go immediately to ED.  At that point he will be looking at [name].  "Okay, he's coming to see him on Tuesday.  There's no sign of infection.  He's on IV antibiotics.  I'll make note of it.  I'll give [name] a call and let him know to look for an signs of infection and if he does, to go to the ED.  I will continue to do the wet to dry in the meantime.  Thanks for calling me back."

## 2021-06-06 ENCOUNTER — Ambulatory Visit: Payer: Managed Care, Other (non HMO) | Admitting: Family Medicine

## 2021-06-07 ENCOUNTER — Ambulatory Visit (INDEPENDENT_AMBULATORY_CARE_PROVIDER_SITE_OTHER): Payer: Managed Care, Other (non HMO) | Admitting: Podiatry

## 2021-06-07 ENCOUNTER — Other Ambulatory Visit: Payer: Self-pay

## 2021-06-07 DIAGNOSIS — I739 Peripheral vascular disease, unspecified: Secondary | ICD-10-CM

## 2021-06-07 DIAGNOSIS — E0843 Diabetes mellitus due to underlying condition with diabetic autonomic (poly)neuropathy: Secondary | ICD-10-CM

## 2021-06-07 DIAGNOSIS — L97512 Non-pressure chronic ulcer of other part of right foot with fat layer exposed: Secondary | ICD-10-CM | POA: Diagnosis not present

## 2021-06-07 DIAGNOSIS — M86271 Subacute osteomyelitis, right ankle and foot: Secondary | ICD-10-CM

## 2021-06-07 NOTE — Progress Notes (Signed)
   Subjective:  Patient presents today status post multiple irrigations and debridements of a severe left foot cellulitis/abscess secondary to diabetes mellitus; uncontrolled.  Patient has been having the nurse come to the home every 2-3 days for wound VAC changes.  He is still on his PICC line for the IV antibiotics and saw Dr. Ola Spurr yesterday.   Last inpatient surgery was performed 04/21/2021 where irrigation debridement was performed with application of negative pressure wound VAC.  DOS: 04/21/2021.  While inpatient below-knee amputation was recommended however the patient refused.   We have been applying negative pressure wound VAC to the foot through home health nurse dressing changes.  He is being managed with infectious disease for antibiotic management.  He presents today for follow-up treatment and evaluation.   Past Medical History:  Diagnosis Date   Diabetes mellitus without complication ([place])    Hyperlipidemia    Hypertension      Objective/Physical Exam Large open wound across the plantar aspect of the midfoot that measures approximately 15 cm x 10 cm x 1.0 cm.  Currently there is no identifiable exposed bone it appears granulation tissue has covered these areas.  There does appear to be good granulation tissue that is improving.  Intermittent fibrotic tissue also noted.  Periwound is macerated.  There is capillary refill to the toes.  There is also an eschar noted to the dorsal lateral aspect of the foot approximately 4 cm in diameter.  Probing wound also noted to the first interdigital webspace extending into the deeper compartment of the foot. There is exposed no bone and fascial tissue.  No malodor noted.  Moderate serosanguineous drainage.  Assessment: 1. s/p multiple irrigation and debridements with subsequent application of wound VAC. DOS: 04/21/2021   Plan of Care:  1. Patient was evaluated. 2.  Medically necessary excisional debridement including muscle and deep  fascial tissue was performed using a tissue nipper with postdebridement measurement same as pre-.  Excisional debridement of all necrotic nonviable tissue down to healthier bleeding viable tissue was performed.  Also the overlying eschar to the dorsal foot wounds were debrided today.  Purulence was drained from these areas.  Saline wet-to-dry dressings were applied. 3.  Continue antibiotic management as per infectious disease 4.  Patient continues to remain very high risk for below-knee amputation.  Although there is some positive signs of granulation tissue formation, there still remains osteomyelitis and additional necrotic debris throughout the wounds of the foot of the foot which will undoubtedly lead to amputation at some point in the near future. Patient refuses amputation.   5.  At the moment we will discontinue the negative pressure wound VAC.  Recommend wet-to-dry saline gauze dressings.  6.  Return to clinic in 3 weeks   Edrick Kins, DPM [facility]  Dr. Edrick Kins, DPM    [address],  [address]                Office [phone]  Fax [phone]

## 2021-06-09 ENCOUNTER — Telehealth: Payer: Self-pay | Admitting: Podiatry

## 2021-06-09 NOTE — Telephone Encounter (Signed)
[facility] nurse a [employer] called wanting to know the wound care orders for Mr.Holness.   Call back Number [phone]

## 2021-06-10 NOTE — Telephone Encounter (Signed)
Left message on voicemail: Saline wet-to-dry gauze.  Paint periwound with Betadine.  Apply large Kerlix and Ace wrap.  Thanks, Dr. Amalia Hailey

## 2021-06-14 ENCOUNTER — Telehealth: Payer: Self-pay | Admitting: *Deleted

## 2021-06-14 NOTE — Telephone Encounter (Signed)
[name] w/ [employer](formerly [employer]) is calling([phone]) to get last office visit notes. Patient had contacted them and said that he was having some issues w/ wound, moisture, sores and possible maggots in wound. Faxed last office visit [phone]) , called and ok'd by patient.

## 2021-06-14 NOTE — Telephone Encounter (Signed)
Called and spoke with [facility] nurse(Lori), gave verbal orders per Dr Amalia Hailey, understood and wanted to make the physician aware that this will be done daily.

## 2021-06-14 NOTE — Telephone Encounter (Signed)
Thank you :)

## 2021-06-20 ENCOUNTER — Other Ambulatory Visit: Payer: Self-pay | Admitting: Family Medicine

## 2021-06-20 DIAGNOSIS — E118 Type 2 diabetes mellitus with unspecified complications: Secondary | ICD-10-CM

## 2021-06-20 DIAGNOSIS — E1169 Type 2 diabetes mellitus with other specified complication: Secondary | ICD-10-CM

## 2021-06-21 ENCOUNTER — Other Ambulatory Visit: Payer: Self-pay | Admitting: Family Medicine

## 2021-06-21 DIAGNOSIS — E11628 Type 2 diabetes mellitus with other skin complications: Secondary | ICD-10-CM

## 2021-06-27 ENCOUNTER — Ambulatory Visit: Payer: Managed Care, Other (non HMO) | Admitting: Family Medicine

## 2021-06-28 ENCOUNTER — Other Ambulatory Visit: Payer: Self-pay

## 2021-06-28 ENCOUNTER — Ambulatory Visit (INDEPENDENT_AMBULATORY_CARE_PROVIDER_SITE_OTHER): Payer: Managed Care, Other (non HMO) | Admitting: Podiatry

## 2021-06-28 DIAGNOSIS — L97512 Non-pressure chronic ulcer of other part of right foot with fat layer exposed: Secondary | ICD-10-CM

## 2021-06-28 DIAGNOSIS — E08621 Diabetes mellitus due to underlying condition with foot ulcer: Secondary | ICD-10-CM | POA: Diagnosis not present

## 2021-06-28 DIAGNOSIS — E0843 Diabetes mellitus due to underlying condition with diabetic autonomic (poly)neuropathy: Secondary | ICD-10-CM | POA: Diagnosis not present

## 2021-06-28 DIAGNOSIS — I739 Peripheral vascular disease, unspecified: Secondary | ICD-10-CM

## 2021-06-28 DIAGNOSIS — L97412 Non-pressure chronic ulcer of right heel and midfoot with fat layer exposed: Secondary | ICD-10-CM

## 2021-06-28 NOTE — Progress Notes (Signed)
   Subjective:  Patient presents today status post multiple irrigations and debridements of a severe left foot cellulitis/abscess secondary to diabetes mellitus; uncontrolled.  Patient has been having the nurse come to the home 2x/week for dressing changes.  Currently the nurses applying wet-to-dry gauze dressings.  We have discontinued the wound VAC since the depth of the wound has improved significantly.  He is still on his PICC line for the IV antibiotics and is managed by Dr. Ola Spurr, infectious disease   Last inpatient surgery was performed 04/21/2021 where irrigation debridement was performed with application of negative pressure wound VAC.  DOS: 04/21/2021.  While inpatient below-knee amputation was recommended however the patient refused.   Over the past 3 weeks we have been applying saline wet-to-dry dressings through the home health nurse dressing changes.  He is being managed with infectious disease for antibiotic management.  He presents today for follow-up treatment and evaluation  Past Medical History:  Diagnosis Date   Diabetes mellitus without complication ([place])    Hyperlipidemia    Hypertension          Objective/Physical Exam Wound #1: Large open wound across the plantar aspect of the midfoot that measures approximately 12 cm x 6 cm x 0.1 cm.    Wound #2: There is also an open wound to the dorsal lateral aspect of the foot approximately 3.0 x 2.0 x 0.5 cm.   Please see above picture.  Currently there is no identifiable exposed bone it appears granulation tissue has covered these areas.  Hypergranular tissue noted.  Heavy serosanguineous drainage noted.  Intermittent fibrotic tissue also noted along the wound edges.  There is capillary refill to the toes.  Assessment: 1. s/p multiple irrigation and debridements with subsequent application of wound VAC. DOS: 04/21/2021 2.  Diabetic foot ulcers x2 right foot 3.  Diabetes mellitus type 2; uncontrolled; with peripheral  polyneuropathy   Plan of Care:  1. Patient was evaluated. 2.  Saline wet-to-dry dressings were applied today.  Continue saline wet-to-dry dressings 2 times per week with the home health nurse  3.  Continue antibiotic management as per infectious disease 4.  Patient continues to remain very high risk for below-knee amputation.    5.  Orders placed for wound care supplies 6.  Today we are going to request authorization for application of skin graft.  The patient has now had this wound for greater than 6 weeks and conservative routine debridements has failed.  This is medically necessary to allow improved closure of the wound. 7.  Return to clinic in 3 weeks for possible application of wound graft   Edrick Kins, DPM [facility]  Dr. Edrick Kins, DPM    [address], [place] [address]                Office [phone]  Fax [phone]

## 2021-07-19 ENCOUNTER — Ambulatory Visit: Payer: Managed Care, Other (non HMO) | Admitting: Podiatry

## 2021-07-19 ENCOUNTER — Ambulatory Visit (INDEPENDENT_AMBULATORY_CARE_PROVIDER_SITE_OTHER): Payer: Managed Care, Other (non HMO)

## 2021-07-19 ENCOUNTER — Other Ambulatory Visit: Payer: Self-pay | Admitting: Family Medicine

## 2021-07-19 ENCOUNTER — Other Ambulatory Visit: Payer: Self-pay

## 2021-07-19 DIAGNOSIS — L97412 Non-pressure chronic ulcer of right heel and midfoot with fat layer exposed: Secondary | ICD-10-CM

## 2021-07-19 DIAGNOSIS — E08621 Diabetes mellitus due to underlying condition with foot ulcer: Secondary | ICD-10-CM

## 2021-07-19 DIAGNOSIS — L97512 Non-pressure chronic ulcer of other part of right foot with fat layer exposed: Secondary | ICD-10-CM

## 2021-07-19 DIAGNOSIS — E559 Vitamin D deficiency, unspecified: Secondary | ICD-10-CM

## 2021-07-19 NOTE — Progress Notes (Signed)
   Subjective:  Patient presents today status post multiple irrigations and debridements of a severe left foot cellulitis/abscess secondary to diabetes mellitus; uncontrolled.  Patient no longer having a nurse come to the home 2 times per week.   Last inpatient surgery was performed 04/21/2021 where irrigation debridement was performed with application of negative pressure wound VAC.  DOS: 04/21/2021.  While inpatient below-knee amputation was recommended however the patient refused.   Patient has been applying saline wet-to-dry dressings through the home health nurse dressing changes.  He is being managed with infectious disease for antibiotic management.  He presents today for follow-up treatment and evaluation  Past Medical History:  Diagnosis Date   Diabetes mellitus without complication ([place])    Hyperlipidemia    Hypertension         Objective/Physical Exam Wound #1: Large open wound across the plantar aspect of the midfoot that measures approximately 11 cm x 6 cm x 0.2 cm.    Wound #2: There is also an open wound to the dorsal lateral aspect of the foot approximately 2.5 x 2.0 x 0.3 cm.   Wound #3 noted to the first interdigital webspace measuring approximately 3 cm in length x0.5 cm in width x1 cm in depth.  Please see above picture.  Currently there is no identifiable exposed bone it appears granulation tissue has covered these areas.  Hypergranular tissue noted.  Heavy serosanguineous drainage noted.  Intermittent fibrotic tissue also noted along the wound edges.  There is capillary refill to the toes.  Assessment: 1. s/p multiple irrigation and debridements with subsequent application of wound VAC. DOS: 04/21/2021 2.  Diabetic foot ulcers x2 right foot 3.  Diabetes mellitus type 2; uncontrolled; with peripheral polyneuropathy   Plan of Care:  1. Patient was evaluated. 2.  Betadine wet-to-dry dressings were applied today.  Continue Betadine wet-to-dry dressings daily  3.   Continue antibiotic management as per infectious disease 4.  Patient continues to remain very high risk for below-knee amputation.    5.  Orders placed for wound care supplies again today 6.  Medically necessary excisional debridement including subcutaneous tissue was performed using a tissue nipper.  Excisional debridement of all necrotic nonviable tissue down to healthy bleeding viable tissue was performed with postdebridement measurement same as pre- 7.  Return to clinic 3 weeks   Edrick Kins, DPM [facility]  Dr. Edrick Kins, DPM    [address], [place] [address]                Office [phone]  Fax [phone]

## 2021-07-19 NOTE — Telephone Encounter (Signed)
Requested medications are due for refill today.  yes  Requested medications are on the active medications list.  yes  Last refill. 04/28/2021  Future visit scheduled.   no  Notes to clinic.  Medication not delegated.

## 2021-07-20 NOTE — Telephone Encounter (Signed)
Last appt was 04/28/21 virtual and no upcoming appt scheduled

## 2021-07-22 ENCOUNTER — Telehealth: Payer: Self-pay | Admitting: *Deleted

## 2021-07-22 NOTE — Telephone Encounter (Signed)
"  When I went to the client's home today, he said he received some Hydrothera Blue from Dr. Amalia Hailey to put on his wound.  I just need clarification of what the new wound care orders are so I can do them.  How many times a week and what he wants done.  Let me know by Monday afternoon."

## 2021-07-25 NOTE — Telephone Encounter (Signed)
Hydrofera blue over the entire wounds every other day with dry sterile compressive dressings. - Dr. Amalia Hailey

## 2021-07-27 ENCOUNTER — Telehealth: Payer: Self-pay | Admitting: Podiatry

## 2021-07-27 NOTE — Telephone Encounter (Signed)
Estill Bamberg could you fax wound care orders or call Cecille Rubin? -Apply Hydrofera blue to entire ulcer areas every other day with dry sterile dressings.  Dr. Amalia Hailey

## 2021-07-27 NOTE — Telephone Encounter (Signed)
URGENT MESSAGE- Cecille Rubin from [facility] called stating she has left messages they need wound care orders from Dr Amalia Hailey. Cecille Rubin states they need them today or they can't go to patients house and treat him. Lori's number is [phone] she states you can leave a message on her VM if she doesn't answer with the orders.

## 2021-08-03 NOTE — Telephone Encounter (Signed)
I attempted to call her and relay Dr. Amalia Hailey' response.    Estill Bamberg please follow-up on this and apologize for the delay in the response.)

## 2021-08-05 ENCOUNTER — Encounter: Payer: Self-pay | Admitting: Podiatry

## 2021-08-09 ENCOUNTER — Ambulatory Visit (INDEPENDENT_AMBULATORY_CARE_PROVIDER_SITE_OTHER): Payer: Managed Care, Other (non HMO) | Admitting: Podiatry

## 2021-08-09 ENCOUNTER — Other Ambulatory Visit: Payer: Self-pay

## 2021-08-09 ENCOUNTER — Ambulatory Visit (INDEPENDENT_AMBULATORY_CARE_PROVIDER_SITE_OTHER): Payer: Managed Care, Other (non HMO)

## 2021-08-09 ENCOUNTER — Encounter: Payer: Self-pay | Admitting: Podiatry

## 2021-08-09 DIAGNOSIS — L97412 Non-pressure chronic ulcer of right heel and midfoot with fat layer exposed: Secondary | ICD-10-CM

## 2021-08-09 DIAGNOSIS — E08621 Diabetes mellitus due to underlying condition with foot ulcer: Secondary | ICD-10-CM

## 2021-08-09 DIAGNOSIS — I739 Peripheral vascular disease, unspecified: Secondary | ICD-10-CM | POA: Diagnosis not present

## 2021-08-09 NOTE — Progress Notes (Signed)
   Subjective:  Patient presents today status post multiple irrigations and debridements of a severe left foot cellulitis/abscess secondary to diabetes mellitus; uncontrolled.  Patient has resumed having a nurse come to the home 2 times per week.  Last inpatient surgery was performed 04/21/2021 where irrigation debridement was performed with application of negative pressure wound VAC.  DOS: 04/21/2021.  While inpatient below-knee amputation was recommended however the patient refused.   Patient has been applying saline wet-to-dry dressings through the home health nurse dressing changes.  He is being managed with infectious disease for antibiotic management.  He presents today for follow-up treatment and evaluation  Past Medical History:  Diagnosis Date   Diabetes mellitus without complication ([place])    Hyperlipidemia    Hypertension    Objective/Physical Exam Wound #1: Large open wound across the plantar aspect of the midfoot that measures approximately 12.0 x 9.0 x 0.3 cm.    Wound #2: There is also an open wound to the dorsal lateral aspect of the foot approximately 2.5 x 2.0 x 0.3 cm.   Wound #3 noted to the first interdigital webspace measuring approximately 3 cm in length x0.5 cm in width x1 cm in depth.  Currently there is no identifiable exposed bone it appears granulation tissue has covered these areas.  Hypergranular tissue noted.  Heavy serosanguineous drainage noted.  Intermittent fibrotic tissue also noted along the wound edges.  There is capillary refill to the toes.  Assessment: 1. s/p multiple irrigation and debridements with subsequent application of wound VAC. DOS: 04/21/2021 2.  Diabetic foot ulcers x2 right foot 3.  Diabetes mellitus type 2; uncontrolled; with peripheral polyneuropathy   Plan of Care:  1. Patient was evaluated. 2.  Betadine wet-to-dry dressings were applied today.  Continue Betadine wet-to-dry dressings daily  3.  Continue antibiotic management as per  infectious disease 4.  Patient continues to remain very high risk for below-knee amputation.    5.  Patient has wound supplies at home 6.  Medically necessary excisional debridement including muscle and deep fascial tissue was performed using a tissue nipper.  Excisional debridement of all necrotic nonviable tissue down to healthy bleeding viable tissue was performed with postdebridement measurement same as pre- 7.  Return to clinic 2 weeks for possible application of skin graft if approved.   Edrick Kins, DPM [facility]  Dr. Edrick Kins, DPM    [address], [place] [address]                Office [phone]  Fax [phone]

## 2021-08-17 ENCOUNTER — Encounter: Payer: Self-pay | Admitting: Podiatry

## 2021-08-19 ENCOUNTER — Other Ambulatory Visit: Payer: Self-pay | Admitting: Sports Medicine

## 2021-08-19 ENCOUNTER — Telehealth: Payer: Self-pay | Admitting: Podiatry

## 2021-08-19 MED ORDER — CIPROFLOXACIN HCL 500 MG PO TABS: 500.0000 mg | ORAL_TABLET | Freq: Two times a day (BID) | ORAL | 0 refills | Status: AC

## 2021-08-19 MED ORDER — DOXYCYCLINE HYCLATE 100 MG PO TABS: 100.0000 mg | ORAL_TABLET | Freq: Two times a day (BID) | ORAL | 0 refills | Status: DC

## 2021-08-19 NOTE — Progress Notes (Signed)
Refilled doxycycline and Cipro; previously patient has been on these 2 antibiotics per infectious disease if drainage or symptoms worsen patient should report to the ER.

## 2021-08-19 NOTE — Telephone Encounter (Signed)
Lori the [facility] wound RN called stating pt has green discharge out of surgery area. Cecille Rubin wants to know what the MD wants to do.

## 2021-08-23 ENCOUNTER — Ambulatory Visit (INDEPENDENT_AMBULATORY_CARE_PROVIDER_SITE_OTHER): Payer: Managed Care, Other (non HMO) | Admitting: Podiatry

## 2021-08-23 ENCOUNTER — Encounter (INDEPENDENT_AMBULATORY_CARE_PROVIDER_SITE_OTHER): Payer: Self-pay

## 2021-08-23 ENCOUNTER — Other Ambulatory Visit: Payer: Self-pay

## 2021-08-23 DIAGNOSIS — E11621 Type 2 diabetes mellitus with foot ulcer: Secondary | ICD-10-CM | POA: Diagnosis not present

## 2021-08-23 DIAGNOSIS — L97412 Non-pressure chronic ulcer of right heel and midfoot with fat layer exposed: Secondary | ICD-10-CM

## 2021-08-23 DIAGNOSIS — E08621 Diabetes mellitus due to underlying condition with foot ulcer: Secondary | ICD-10-CM

## 2021-08-23 NOTE — Progress Notes (Signed)
   Subjective:  Patient presents today status post multiple irrigations and debridements of a severe left foot cellulitis/abscess secondary to diabetes mellitus; h/o uncontrolled.  Last inpatient surgery was performed 04/21/2021 where irrigation debridement was performed with application of negative pressure wound VAC.  DOS: 04/21/2021.  While inpatient below-knee amputation was recommended however the patient refused.   Patient has been applying saline wet-to-dry dressings through the home health nurse dressing changes.  He is being managed with infectious disease for antibiotic management.  He presents today for follow-up treatment and evaluation.  Last visit authorization request for Apligraf was sent and approved.  He presents today to have the Apligraf applied  Past Medical History:  Diagnosis Date   Diabetes mellitus without complication ([place])    Hyperlipidemia    Hypertension       Objective/Physical Exam Wound #1: Large open wound across the plantar aspect of the midfoot that measures approximately 12.0 x 9.0 x 0.3 cm.    Wound #2 noted to the dorsal lateral aspect of the foot has healed.  Complete reepithelialization has occurred.  Wound #3 noted to the first interdigital webspace measuring approximately 3 cm in length x0.5 cm in width x1 cm in depth.  Currently there is no identifiable exposed bone it appears granulation tissue has covered these areas.  Hypergranular tissue noted.  Improved serosanguineous drainage noted.  Intermittent fibrotic tissue also noted along the wound edges.  There is capillary refill to the toes.  Assessment: 1. s/p multiple irrigation and debridements with subsequent application of wound VAC. DOS: 04/21/2021 2.  Diabetic foot ulcers x2 right foot 3.  Diabetes mellitus type 2; uncontrolled; with peripheral polyneuropathy   Plan of Care:  1. Patient was evaluated. 2.  Medically necessary application of 88 cm of Apligraf was applied to the wounds to a  total wound measurement area of 108 cm.  Reinforced and secured with Steri-Strips followed by dry sterile dressing.  Please note this graft is medically necessary.  The patient has failed all conservative modalities and graft is warranted at this time to aid in wound healing. 3.  Instructions to keep clean dry and intact x1 week 4.  Return to clinic in 1 week for reapplication of graft  Apligraf LOT [id number] EXP 2021-09-03  Apligraf LOT [id number] EXP 2021-09-03  Edrick Kins, DPM [facility]  Dr. Edrick Kins, DPM    [address], [place] [address]                Office [phone]  Fax [phone]

## 2021-08-30 ENCOUNTER — Encounter: Payer: Self-pay | Admitting: Podiatry

## 2021-08-30 ENCOUNTER — Ambulatory Visit (INDEPENDENT_AMBULATORY_CARE_PROVIDER_SITE_OTHER): Payer: Managed Care, Other (non HMO) | Admitting: Podiatry

## 2021-08-30 ENCOUNTER — Other Ambulatory Visit: Payer: Self-pay

## 2021-08-30 ENCOUNTER — Encounter (INDEPENDENT_AMBULATORY_CARE_PROVIDER_SITE_OTHER): Payer: Self-pay

## 2021-08-30 VITALS — Temp 97.7°F

## 2021-08-30 DIAGNOSIS — L97412 Non-pressure chronic ulcer of right heel and midfoot with fat layer exposed: Secondary | ICD-10-CM

## 2021-08-30 DIAGNOSIS — E08621 Diabetes mellitus due to underlying condition with foot ulcer: Secondary | ICD-10-CM | POA: Diagnosis not present

## 2021-08-30 NOTE — Progress Notes (Signed)
   Subjective:  Patient presents today for follow-up evaluation of an ulcer to the plantar aspect of the right foot.  Last visit on 08/23/2021 Apligraf was applied.  He has kept the dressing clean dry and intact for the past week.  He presents for further treatment and evaluation  Past Medical History:  Diagnosis Date   Diabetes mellitus without complication ([place])    Hyperlipidemia    Hypertension    Objective/Physical Exam Wound #1: Large open wound across the plantar aspect of the midfoot that measures approximately 12.0 x 6.5 x 0.3 cm.    Wound #2: There is also an open wound to the dorsal lateral aspect of the foot which has healed  Wound #3 noted to the first interdigital webspace measuring approximately 3 cm in length x0.5 cm in width x1 cm in depth.  No significant change  Currently there is no identifiable exposed bone it appears granulation tissue has covered these areas.  Hypergranular tissue noted.  Heavy serosanguineous drainage noted.  Intermittent fibrotic tissue also noted along the wound edges.  There is capillary refill to the toes.  Assessment: 1. s/p multiple irrigation and debridements with subsequent application of wound VAC. DOS: 04/21/2021 2.  Diabetic foot ulcers x2 right foot 3.  Diabetes mellitus type 2; uncontrolled; with peripheral polyneuropathy   Plan of Care:  1. Patient was evaluated. 2.  Medically necessary excisional debridement including subcutaneous tissue was performed using a tissue nipper.  Excisional debridement of all necrotic nonviable tissue down to healthy bleeding viable tissue was performed with postdebridement measurement same as pre- 3.  Graft reapplication pending approval.  In the meantime we will have the patient change the dressings daily with dry sterile dressings 4.  Return to clinic in 1 week for graft reapplication.  I will not be in the office next week so he will likely have graft application by another provider. 5.  Follow-up with  me 2 weeks   Edrick Kins, DPM [facility]  Dr. Edrick Kins, DPM    [address], [place] [address]                Office [phone]  Fax [phone]

## 2021-08-31 ENCOUNTER — Telehealth: Payer: Self-pay | Admitting: Podiatry

## 2021-08-31 NOTE — Telephone Encounter (Signed)
URGENT Pt. Home nurse  called a few times to check on what she needs to do to care for him she wants you to call her at [phone] Lori is her name

## 2021-09-05 ENCOUNTER — Telehealth: Payer: Self-pay

## 2021-09-05 MED ORDER — GENTAMICIN SULFATE 0.1 % EX OINT: 1.0000 "application " | TOPICAL_OINTMENT | Freq: Two times a day (BID) | CUTANEOUS | 0 refills | Status: DC

## 2021-09-05 NOTE — Telephone Encounter (Signed)
Dr. Amalia Hailey patient called, stated that when changing the dressing yesterday, he noticed a small amount of greenish discharge on the bandage with an odor.  He denies any fever/chills, s/s of infection.  He was wanting to know if he needed an antibiotic or continue with daily dressing changes until seen.  He has an appt with you this coming Thursday.  Please advise

## 2021-09-05 NOTE — Telephone Encounter (Signed)
"  I'm calling to see if Angie got in contact with the doctor.  My wife saw some greens discharge when she was changing my dressing.  I want to know if I need to be on an antibiotic.  She said she was going to ask Dr. Sherryle Lis."  Dr. Sherryle Lis has not responded yet.  He's in our [place] office seeing patients.  He will respond.  He may send a prescription directly to your pharmacy which is [facility] corner of [address].  "Murray, thank you."

## 2021-09-05 NOTE — Telephone Encounter (Signed)
Spoke w/ patient and Rx for gentamicin ointment sent to pharmacy, no systemic symptoms or indication for PO abx yet

## 2021-09-05 NOTE — Addendum Note (Signed)
Addended bySherryle Lis, Karanvir Balderston R on: 09/05/2021 05:37 PM   Modules accepted: Orders

## 2021-09-08 ENCOUNTER — Ambulatory Visit (INDEPENDENT_AMBULATORY_CARE_PROVIDER_SITE_OTHER): Payer: Managed Care, Other (non HMO) | Admitting: Podiatry

## 2021-09-08 ENCOUNTER — Other Ambulatory Visit: Payer: Self-pay

## 2021-09-08 DIAGNOSIS — L97412 Non-pressure chronic ulcer of right heel and midfoot with fat layer exposed: Secondary | ICD-10-CM

## 2021-09-08 DIAGNOSIS — E08621 Diabetes mellitus due to underlying condition with foot ulcer: Secondary | ICD-10-CM

## 2021-09-09 NOTE — Progress Notes (Signed)
  Subjective:  Patient ID: [name], male    DOB: May 03, 1966,  MRN: [id number]  Chief Complaint  Patient presents with   Diabetic Ulcer     wound care right foot    55 y.o. male presents with the above complaint. History confirmed with patient.  He has had ongoing wound care with Dr. Amalia Hailey and they have been applying Apligraf.  Unfortunate does not have approval today for Apligraf medication.  They have been using gentamicin cream which I prescribed for him over the phone this week.  Objective:  Physical Exam: warm, good capillary refill, no trophic changes or ulcerative lesions, normal DP and PT pulses, and normal sensory exam.  Large plantar forefoot and midfoot ulceration of the right foot measuring 12.5 x 4.5 x 0.3 cm with surrounding hyperkeratosis significant granular bed.  No signs of infection.  No green drainage.  Assessment:   1. Diabetic ulcer of right midfoot associated with diabetes mellitus due to underlying condition, with fat layer exposed ([place])      Plan:  Patient was evaluated and treated and all questions answered.  Ulcer right foot -We discussed the etiology and factors that are a part of the wound healing process.  We also discussed the risk of infection both soft tissue and osteomyelitis from open ulceration.  Discussed the risk of limb loss if this happens or worsens. -Debridement as below. -Dressed with Silvadene, DSD. -Continue off-loading with surgical shoe. -Continue gentamicin cream at home for now  Procedure: Excisional Debridement of Wound Rationale: Removal of non-viable soft tissue from the wound to promote healing.  Anesthesia: none Post-Debridement Wound Measurements: 12.5 cm x 4.5 cm x 0.3 cm  Type of Debridement: Sharp Excisional Tissue Removed: Non-viable soft tissue Depth of Debridement: subcutaneous tissue. Technique: Sharp excisional debridement to bleeding, viable wound base.  Dressing: Dry, sterile, compression  dressing. Disposition: Patient tolerated procedure well.       Return in about 1 week (around 09/15/2021) for wound care.

## 2021-09-13 ENCOUNTER — Other Ambulatory Visit: Payer: Self-pay

## 2021-09-13 ENCOUNTER — Ambulatory Visit (INDEPENDENT_AMBULATORY_CARE_PROVIDER_SITE_OTHER): Payer: Managed Care, Other (non HMO) | Admitting: Podiatry

## 2021-09-13 DIAGNOSIS — E08621 Diabetes mellitus due to underlying condition with foot ulcer: Secondary | ICD-10-CM

## 2021-09-13 DIAGNOSIS — L97412 Non-pressure chronic ulcer of right heel and midfoot with fat layer exposed: Secondary | ICD-10-CM | POA: Diagnosis not present

## 2021-09-13 NOTE — Progress Notes (Signed)
   Subjective:  Patient presents today for follow-up evaluation of an ulcer to the plantar aspect of the right foot.  Last visit on 08/23/2021 Apligraf was applied.  Graft reapplication is pending authorization.  Patient has been changing the dressings daily with the gentamicin cream. Past Medical History:  Diagnosis Date   Diabetes mellitus without complication ([place])    Hyperlipidemia    Hypertension    Objective/Physical Exam Wound #1: Large open wound across the plantar aspect of the midfoot that measures approximately 12.0 x 6.4 x 0.3 cm.    Wound #2: There is also an open wound to the dorsal lateral aspect of the foot which has healed  Wound #3 noted to the first interdigital webspace measuring approximately 2.5 cm in length x0.5 cm in width x1 cm in depth.  No significant change  Currently there is no identifiable exposed bone it appears granulation tissue has covered these areas.  Hypergranular tissue noted.  Heavy serosanguineous drainage noted.  Intermittent fibrotic tissue also noted along the wound edges.  There is capillary refill to the toes.  Assessment: 1. s/p multiple irrigation and debridements with subsequent application of wound VAC. DOS: 04/21/2021 2.  Diabetic foot ulcers x2 right foot 3.  Diabetes mellitus type 2; uncontrolled; with peripheral polyneuropathy   Plan of Care:  1. Patient was evaluated. 2.  Medically necessary excisional debridement including muscle and deep fascial tissue was performed using a tissue nipper.  Excisional debridement of all necrotic nonviable tissue down to healthy bleeding viable tissue was performed with postdebridement measurement same as pre- 3.  Graft reapplication pending approval.  In the meantime we will have the patient change the dressings daily with dry sterile dressings and gentamicin cream that was prescribed last visit.  Continue daily 4.  Letter of medical necessity completed today and sent to insurance for consideration and  authorization of the graft  5.  Return to clinic in 1 week for graft reapplication if the graft is approved.     Edrick Kins, DPM [facility]  Dr. Edrick Kins, DPM    [address], [place] [address]                Office [phone]  Fax [phone]

## 2021-09-20 ENCOUNTER — Ambulatory Visit (INDEPENDENT_AMBULATORY_CARE_PROVIDER_SITE_OTHER): Payer: Managed Care, Other (non HMO) | Admitting: Podiatry

## 2021-09-20 ENCOUNTER — Other Ambulatory Visit: Payer: Self-pay

## 2021-09-20 DIAGNOSIS — L97412 Non-pressure chronic ulcer of right heel and midfoot with fat layer exposed: Secondary | ICD-10-CM

## 2021-09-20 DIAGNOSIS — E08621 Diabetes mellitus due to underlying condition with foot ulcer: Secondary | ICD-10-CM | POA: Diagnosis not present

## 2021-09-20 DIAGNOSIS — I739 Peripheral vascular disease, unspecified: Secondary | ICD-10-CM

## 2021-09-20 NOTE — Progress Notes (Signed)
   Subjective:  Patient presents today for follow-up evaluation of an ulcer to the plantar aspect of the right foot.  Last visit on 08/23/2021 Apligraf was applied.  Graft reapplication is pending authorization.  Patient has been changing the dressings daily with the gentamicin cream. Past Medical History:  Diagnosis Date   Diabetes mellitus without complication ([place])    Hyperlipidemia    Hypertension    Objective/Physical Exam Wound #1: Large open wound across the plantar aspect of the midfoot that measures approximately 12.0 x 6.4 x 0.3 cm.    Wound #2: There is also an open wound to the dorsal lateral aspect of the foot which has healed  Wound #3 noted to the first interdigital webspace measuring approximately 2.5 cm in length x0.5 cm in width x1 cm in depth.  No significant change  Currently there is no identifiable exposed bone it appears granulation tissue has covered these areas.  Hypergranular tissue noted.  Heavy serosanguineous drainage noted.  Intermittent fibrotic tissue also noted along the wound edges.  There is capillary refill to the toes.  Assessment: 1. s/p multiple irrigation and debridements with subsequent application of wound VAC. DOS: 04/21/2021 2.  Diabetic foot ulcers x2 right foot 3.  Diabetes mellitus type 2; uncontrolled; with peripheral polyneuropathy   Plan of Care:  1. Patient was evaluated. 2.  Medically necessary excisional debridement including muscle and deep fascial tissue was performed using a tissue nipper.  Excisional debridement of all necrotic nonviable tissue down to healthy bleeding viable tissue was performed with postdebridement measurement same as pre- 3.  Graft reapplication pending approval.  In the meantime we will have the patient change the dressings daily with dry sterile dressings and gentamicin cream that was prescribed last visit.  Continue daily 4.  Letter of medical necessity completed today and sent to insurance for consideration and  authorization of the graft  5.  Approval for the graft apparently was obtained today.  We are going to go ahead and schedule for the patient to return in 1 week for graft application   Edrick Kins, DPM [facility]  Dr. Edrick Kins, DPM    [address], [place] [address]                Office [phone]  Fax [phone]

## 2021-09-22 ENCOUNTER — Other Ambulatory Visit: Payer: Self-pay | Admitting: Family Medicine

## 2021-09-22 DIAGNOSIS — E785 Hyperlipidemia, unspecified: Secondary | ICD-10-CM

## 2021-09-22 DIAGNOSIS — E1169 Type 2 diabetes mellitus with other specified complication: Secondary | ICD-10-CM

## 2021-09-22 DIAGNOSIS — I10 Essential (primary) hypertension: Secondary | ICD-10-CM

## 2021-09-22 DIAGNOSIS — E118 Type 2 diabetes mellitus with unspecified complications: Secondary | ICD-10-CM

## 2021-09-22 DIAGNOSIS — E669 Obesity, unspecified: Secondary | ICD-10-CM

## 2021-09-27 ENCOUNTER — Other Ambulatory Visit: Payer: Self-pay

## 2021-09-27 ENCOUNTER — Ambulatory Visit (INDEPENDENT_AMBULATORY_CARE_PROVIDER_SITE_OTHER): Payer: Managed Care, Other (non HMO) | Admitting: Podiatry

## 2021-09-27 DIAGNOSIS — L97412 Non-pressure chronic ulcer of right heel and midfoot with fat layer exposed: Secondary | ICD-10-CM

## 2021-09-27 DIAGNOSIS — E08621 Diabetes mellitus due to underlying condition with foot ulcer: Secondary | ICD-10-CM | POA: Diagnosis not present

## 2021-09-27 DIAGNOSIS — I739 Peripheral vascular disease, unspecified: Secondary | ICD-10-CM

## 2021-09-27 NOTE — Progress Notes (Signed)
   Subjective:  Patient presents today status post multiple irrigations and debridements of a severe left foot cellulitis/abscess secondary to diabetes mellitus; h/o uncontrolled.  Last inpatient surgery was performed 04/21/2021 where irrigation debridement was performed with application of negative pressure wound VAC.  DOS: 04/21/2021.  While inpatient below-knee amputation was recommended however the patient refused.   Patient had Apligraf application performed on 08/23/2021 and there was significant improvement of the wound at that time.  Just last week he was reauthorized for a second application.  Today he presents to have that application performed  Past Medical History:  Diagnosis Date   Diabetes mellitus without complication ([place])    Hyperlipidemia    Hypertension        Objective/Physical Exam Wound #1: Large open wound across the plantar aspect of the midfoot that measures approximately 11.0 x 4.0 x 0.2 cm.    Wound #2 noted to the dorsal lateral aspect of the foot has healed.  Complete reepithelialization has occurred.  Wound #3 noted to the first interdigital webspace measuring approximately 3 cm in length x0.5 cm in width x1 cm in depth.  Currently there is no identifiable exposed bone it appears granulation tissue has covered these areas.  Hypergranular tissue noted.  Improved serosanguineous drainage noted.  Intermittent fibrotic tissue also noted along the wound edges.  There is capillary refill to the toes.  Assessment: 1. s/p multiple irrigation and debridements with subsequent application of wound VAC. DOS: 04/21/2021 2.  Diabetic foot ulcers x2 right foot 3.  Diabetes mellitus type 2; uncontrolled; with peripheral polyneuropathy   Plan of Care:  1. Patient was evaluated. 2.  Medically necessary application of 88 cm of Apligraf was applied to the wounds to a total wound measurement area of 108 cm.  Reinforced and secured with Steri-Strips followed by dry sterile  dressing.  Please note this graft is medically necessary.  The patient has failed all conservative modalities and graft is warranted at this time to aid in wound healing. 3.  Instructions to keep clean dry and intact x1 week 4.  Return to clinic in 1 week for reapplication of graft  Apligraf LOT [id number] EXP 2021-10-08  Apligraf LOT [id number] EXP 2021-10-08  Edrick Kins, DPM [facility]  Dr. Edrick Kins, DPM    [address], [place] [address]                Office [phone]  Fax [phone]

## 2021-10-04 ENCOUNTER — Encounter (INDEPENDENT_AMBULATORY_CARE_PROVIDER_SITE_OTHER): Payer: Self-pay

## 2021-10-04 ENCOUNTER — Ambulatory Visit (INDEPENDENT_AMBULATORY_CARE_PROVIDER_SITE_OTHER): Payer: Managed Care, Other (non HMO) | Admitting: Podiatry

## 2021-10-04 ENCOUNTER — Other Ambulatory Visit: Payer: Self-pay

## 2021-10-04 DIAGNOSIS — L97412 Non-pressure chronic ulcer of right heel and midfoot with fat layer exposed: Secondary | ICD-10-CM | POA: Diagnosis not present

## 2021-10-04 DIAGNOSIS — E08621 Diabetes mellitus due to underlying condition with foot ulcer: Secondary | ICD-10-CM

## 2021-10-11 ENCOUNTER — Other Ambulatory Visit: Payer: Self-pay

## 2021-10-11 ENCOUNTER — Ambulatory Visit (INDEPENDENT_AMBULATORY_CARE_PROVIDER_SITE_OTHER): Payer: Managed Care, Other (non HMO) | Admitting: Podiatry

## 2021-10-11 ENCOUNTER — Encounter (INDEPENDENT_AMBULATORY_CARE_PROVIDER_SITE_OTHER): Payer: Self-pay

## 2021-10-11 DIAGNOSIS — L97412 Non-pressure chronic ulcer of right heel and midfoot with fat layer exposed: Secondary | ICD-10-CM | POA: Diagnosis not present

## 2021-10-11 DIAGNOSIS — E08621 Diabetes mellitus due to underlying condition with foot ulcer: Secondary | ICD-10-CM | POA: Diagnosis not present

## 2021-10-11 NOTE — Progress Notes (Signed)
   Subjective:  Patient presents today status post multiple irrigations and debridements of a severe left foot cellulitis/abscess secondary to diabetes mellitus; h/o uncontrolled.  Last inpatient surgery was performed 04/21/2021 where irrigation debridement was performed with application of negative pressure wound VAC.  DOS: 04/21/2021.  While inpatient below-knee amputation was recommended however the patient refused.   Patient had Apligraf application performed on 09/27/2021 and there was significant improvement of the wound at that time.  Patient presents today to have graft reapplication performed. Past Medical History:  Diagnosis Date   Diabetes mellitus without complication ([place])    Hyperlipidemia    Hypertension        Objective/Physical Exam Wound #1: Large open wound across the plantar aspect of the midfoot that measures approximately 10.0x3.5 x 0.2 cm.  Significant improvement since last visit.  Central bridging of the wound is noted with good healthy viable skin.  Wound #2 noted to the dorsal lateral aspect of the foot has healed.  Complete reepithelialization has occurred.  Wound #3 noted to the first interdigital webspace measuring approximately 3 cm in length x0.5 cm in width x1 cm in depth.  Currently there is no identifiable exposed bone it appears granulation tissue has covered these areas.  Hypergranular tissue noted.  Improved serosanguineous drainage noted.  Intermittent fibrotic tissue also noted along the wound edges.  There is capillary refill to the toes.  Assessment: 1. s/p multiple irrigation and debridements with subsequent application of wound VAC. DOS: 04/21/2021 2.  Diabetic foot ulcers x2 right foot 3.  Diabetes mellitus type 2; uncontrolled; with peripheral polyneuropathy   Plan of Care:  1. Patient was evaluated. 2.  Medically necessary application of 88 cm of Apligraf was applied to the wounds to a total wound measurement area of 108 cm.  Reinforced  and secured with Steri-Strips followed by dry sterile dressing.  Please note this graft is medically necessary.  The patient has failed all conservative modalities and graft is warranted at this time to aid in wound healing. 3.  Instructions to keep clean dry and intact x1 week 4.  Return to clinic in 1 week for reapplication of graft  Apligraf LOT [id number] EXP 2021-10-19  Apligraf LOT [id number] EXP 2021-10-19  Edrick Kins, DPM [facility]  Dr. Edrick Kins, DPM    [address], [place] [address]                Office [phone]  Fax [phone]

## 2021-10-16 NOTE — Progress Notes (Addendum)
   Subjective:  Patient presents today status post multiple irrigations and debridements of a severe left foot cellulitis/abscess secondary to diabetes mellitus; h/o uncontrolled.  Last inpatient surgery was performed 04/21/2021 where irrigation debridement was performed with application of negative pressure wound VAC.  DOS: 04/21/2021.  While inpatient below-knee amputation was recommended however the patient refused.   Patient has had great success with the Apligraf application that we have been applying.  We have been applying every other week.  Past Medical History:  Diagnosis Date   Diabetes mellitus without complication ([place])    Hyperlipidemia    Hypertension        Objective/Physical Exam Wound #1: Large open wound across the plantar aspect of the midfoot that measures approximately 11.0 x 4.0 x 0.2 cm.    Wound #2 noted to the dorsal lateral aspect of the foot has healed.  Complete reepithelialization has occurred.  Wound #3 noted to the first interdigital webspace measuring approximately 3 cm in length x0.5 cm in width x1 cm in depth.  Currently there is no identifiable exposed bone it appears granulation tissue has covered these areas.  Hypergranular tissue noted.  Improved serosanguineous drainage noted.  Intermittent fibrotic tissue also noted along the wound edges.  There is capillary refill to the toes.  Assessment: 1. s/p multiple irrigation and debridements with subsequent application of wound VAC. DOS: 04/21/2021 2.  Diabetic foot ulcers x2 right foot 3.  Diabetes mellitus type 2; uncontrolled; with peripheral polyneuropathy   Plan of Care:  1. Patient was evaluated. 2.  Very light debridement of the area was performed today.  We left the majority of the graft intact 3.  Instructions to keep clean dry and intact x1 week 4.  Return to clinic in 1 week for reapplication of graft   Edrick Kins, DPM [facility]  Dr. Edrick Kins, DPM    [address], [place] [address]                Office [phone]  Fax [phone]

## 2021-10-16 NOTE — Addendum Note (Signed)
Addended by: Edrick Kins on: 10/16/2021 12:44 PM   Modules accepted: Level of Service

## 2021-10-18 ENCOUNTER — Other Ambulatory Visit: Payer: Self-pay

## 2021-10-18 ENCOUNTER — Ambulatory Visit (INDEPENDENT_AMBULATORY_CARE_PROVIDER_SITE_OTHER): Payer: Managed Care, Other (non HMO) | Admitting: Podiatry

## 2021-10-18 DIAGNOSIS — E08621 Diabetes mellitus due to underlying condition with foot ulcer: Secondary | ICD-10-CM

## 2021-10-18 DIAGNOSIS — L97412 Non-pressure chronic ulcer of right heel and midfoot with fat layer exposed: Secondary | ICD-10-CM | POA: Diagnosis not present

## 2021-10-18 NOTE — Progress Notes (Signed)
   Subjective:  Patient presents today status post multiple irrigations and debridements of a severe left foot cellulitis/abscess secondary to diabetes mellitus; h/o uncontrolled.  Last inpatient surgery was performed 04/21/2021 where irrigation debridement was performed with application of negative pressure wound VAC.  DOS: 04/21/2021.  While inpatient below-knee amputation was recommended however the patient refused.   Patient had Apligraf application performed on 09/27/2021 and there was significant improvement of the wound at that time.  Patient presents today to have graft reapplication performed. Past Medical History:  Diagnosis Date   Diabetes mellitus without complication ([place])    Hyperlipidemia    Hypertension       Objective/Physical Exam Wound #1: Large open wound across the plantar aspect of the midfoot that measures approximately 10.0x3.5 x 0.2 cm.  Significant improvement since last visit.  Central bridging of the wound is noted with good healthy viable skin.  Wound #2 noted to the dorsal lateral aspect of the foot has healed.  Complete reepithelialization has occurred.  Wound #3 noted to the first interdigital webspace measuring approximately 3 cm in length x0.5 cm in width x1 cm in depth.  Currently there is no identifiable exposed bone it appears granulation tissue has covered these areas.  Hypergranular tissue noted.  Improved serosanguineous drainage noted.  Intermittent fibrotic tissue also noted along the wound edges.  There is capillary refill to the toes.  Assessment: 1. s/p multiple irrigation and debridements with subsequent application of wound VAC. DOS: 04/21/2021 2.  Diabetic foot ulcers x2 right foot 3.  Diabetes mellitus type 2; uncontrolled; with peripheral polyneuropathy   Plan of Care:  1. Patient was evaluated. 2.  Medically necessary application of 88 cm of Apligraf was applied to the wounds to a total wound measurement area of 108 cm.  Reinforced and  secured with Steri-Strips followed by dry sterile dressing.  Please note this graft is medically necessary.  The patient has failed all conservative modalities and graft is warranted at this time to aid in wound healing. 3.  Instructions to keep clean dry and intact x1 week 4.  Return to clinic in 1 week for reapplication of graft  Apligraf LOT [id number] EXP 2021-10-27  Apligraf LOT [id number] EXP 2021-10-27  Edrick Kins, DPM [facility]  Dr. Edrick Kins, DPM    [address], [place] [address]                Office [phone]  Fax [phone]

## 2021-10-20 ENCOUNTER — Telehealth: Payer: Self-pay | Admitting: *Deleted

## 2021-10-20 NOTE — Telephone Encounter (Signed)
[name] said that she has to sign him up for 60 days and then place on hold, medicare guidelines,wanted to make doctor aware.

## 2021-10-20 NOTE — Telephone Encounter (Signed)
Please put on hold for now. Ammie can you reach out and let her know. Thanks, Dr. Amalia Hailey

## 2021-10-20 NOTE — Telephone Encounter (Signed)
[name], nurse w/ [name] is calling to request clarification on whether not to sign up patient for another 60 days.  The patient mentioned that no wound care being done on right foot,doing a skin graft. Does she proceed, put on hold or discharge patient? Patient may need wound care supplies afterwards as well. Please contact today: [phone] Please advise.

## 2021-10-21 NOTE — Telephone Encounter (Signed)
Okay, thanks.-Dr. Amalia Hailey

## 2021-10-25 ENCOUNTER — Other Ambulatory Visit: Payer: Self-pay

## 2021-10-25 ENCOUNTER — Ambulatory Visit (INDEPENDENT_AMBULATORY_CARE_PROVIDER_SITE_OTHER): Payer: Managed Care, Other (non HMO) | Admitting: Podiatry

## 2021-10-25 DIAGNOSIS — E08621 Diabetes mellitus due to underlying condition with foot ulcer: Secondary | ICD-10-CM

## 2021-10-25 DIAGNOSIS — L97412 Non-pressure chronic ulcer of right heel and midfoot with fat layer exposed: Secondary | ICD-10-CM | POA: Diagnosis not present

## 2021-10-25 NOTE — Progress Notes (Signed)
   Subjective:  Patient presents today status post multiple irrigations and debridements of a severe left foot cellulitis/abscess secondary to diabetes mellitus; h/o uncontrolled.  Last inpatient surgery was performed 04/21/2021 where irrigation debridement was performed with application of negative pressure wound VAC.  DOS: 04/21/2021.  While inpatient below-knee amputation was recommended however the patient refused.   Patient had Apligraf applications and there has been significant improvement of the wound at that time.  Patient presents today to have graft reapplication performed however unfortunately today there is no graft present.  Past Medical History:  Diagnosis Date   Diabetes mellitus without complication ([place])    Hyperlipidemia    Hypertension        Objective/Physical Exam Wound #1: Large open wound across the plantar aspect of the midfoot that measures approximately 10.0x3.5 x 0.2 cm.  Significant improvement since last visit.  Central bridging of the wound is noted with good healthy viable skin.  Wound #2 noted to the dorsal lateral aspect of the foot has healed.  Complete reepithelialization has occurred.  Wound #3 noted to the first interdigital webspace measuring approximately 3 cm in length x0.5 cm in width x0.5 cm in depth.  Overall there is some improvement there as well with improved maceration and decreased drainage  Currently there is no identifiable exposed bone it appears granulation tissue has covered these areas.  Hypergranular tissue noted.  Improved serosanguineous drainage noted.  Intermittent fibrotic tissue also noted along the wound edges.  There is capillary refill to the toes.  Assessment: 1. s/p multiple irrigation and debridements with subsequent application of wound VAC. DOS: 04/21/2021 2.  Diabetic foot ulcers x2 right foot 3.  Diabetes mellitus type 2; uncontrolled; with peripheral polyneuropathy   Plan of Care:  1. Patient was evaluated. 2.   Since there is no graft present today we will go ahead and allow the patient to change the dressings this week with dry sterile dressings daily.  Recommend daily foot soaks and Epson salt with Betadine. 3.  Continue minimal weightbearing 4.  Return to clinic in 1 week for graft reapplication  Edrick Kins, DPM [facility]  Dr. Edrick Kins, DPM    [address], [place] [address]                Office [phone]  Fax [phone]

## 2021-10-28 ENCOUNTER — Telehealth: Payer: Self-pay | Admitting: *Deleted

## 2021-10-28 NOTE — Telephone Encounter (Signed)
"  I just want to know if I can put on a shoe for a day."

## 2021-10-31 NOTE — Telephone Encounter (Signed)
That is fine.  Thanks, Dr. Chesnie Capell

## 2021-10-31 NOTE — Telephone Encounter (Signed)
I left him a message that Dr. Amalia Hailey said it's okay for him to wear a regular shoe for a day.

## 2021-11-01 ENCOUNTER — Encounter: Payer: Self-pay | Admitting: Podiatry

## 2021-11-01 ENCOUNTER — Ambulatory Visit (INDEPENDENT_AMBULATORY_CARE_PROVIDER_SITE_OTHER): Payer: Managed Care, Other (non HMO) | Admitting: Podiatry

## 2021-11-01 ENCOUNTER — Other Ambulatory Visit: Payer: Self-pay

## 2021-11-01 DIAGNOSIS — E0843 Diabetes mellitus due to underlying condition with diabetic autonomic (poly)neuropathy: Secondary | ICD-10-CM

## 2021-11-01 DIAGNOSIS — L97512 Non-pressure chronic ulcer of other part of right foot with fat layer exposed: Secondary | ICD-10-CM

## 2021-11-08 ENCOUNTER — Ambulatory Visit (INDEPENDENT_AMBULATORY_CARE_PROVIDER_SITE_OTHER): Payer: Managed Care, Other (non HMO) | Admitting: Podiatry

## 2021-11-08 ENCOUNTER — Other Ambulatory Visit: Payer: Self-pay

## 2021-11-08 ENCOUNTER — Ambulatory Visit (INDEPENDENT_AMBULATORY_CARE_PROVIDER_SITE_OTHER): Payer: Managed Care, Other (non HMO)

## 2021-11-08 DIAGNOSIS — L97412 Non-pressure chronic ulcer of right heel and midfoot with fat layer exposed: Secondary | ICD-10-CM

## 2021-11-08 DIAGNOSIS — E08621 Diabetes mellitus due to underlying condition with foot ulcer: Secondary | ICD-10-CM

## 2021-11-08 NOTE — Progress Notes (Signed)
   Subjective:  Patient presents today status post multiple irrigations and debridements of a severe left foot cellulitis/abscess secondary to diabetes mellitus; h/o uncontrolled.  Last inpatient surgery was performed 04/21/2021 where irrigation debridement was performed with application of negative pressure wound VAC.  DOS: 04/21/2021.  While inpatient below-knee amputation was recommended however the patient refused.   Patient had Apligraf applications and there has been significant improvement of the wound at that time.  Patient presents today for follow-up evaluation and treatment  Past Medical History:  Diagnosis Date   Diabetes mellitus without complication ([place])    Hyperlipidemia    Hypertension     Objective/Physical Exam Wound #1: Large open plantar wound has a significant amount of reepithelialization across the plantar aspect of the wound.  There is only small intermittent areas of exposed subcutaneous tissue.  The largest portion of the wound to the proximal lateral foot is still exposed with hypergranular tissue.  Again, there is no exposed bone muscle tendon ligament or joint.  Overall there is significant improvement with every application of the graft  Wound #2 noted to the dorsal lateral aspect of the foot has healed.  Complete reepithelialization has occurred.  Wound #3 noted to the first interdigital webspace measuring approximately 0.5 x 0.2 x 0.2 cm in depth.  Overall there is significant improvement there as well with improved maceration and decreased drainage  Currently there is no identifiable exposed bone it appears granulation tissue has covered these areas.  Hypergranular tissue noted.  Improved serosanguineous drainage noted.  Intermittent fibrotic tissue also noted along the wound edges.  There is capillary refill to the toes.  Radiographic exam RT foot 11/08/2021: Absence of the fifth ray noted.  Adductovarus deformity noted.  Diffuse degenerative changes noted  throughout the TMT and MTP joints.  There is concern for chronic osteomyelitis however there is no significant degenerative changes since x-rays were taken 08/09/2021.  There actually appears to be some osseous stabilization and restructuring along the fourth metatarsal.  Assessment: 1. s/p multiple irrigation and debridements with subsequent application of wound VAC. DOS: 04/21/2021 2.  Diabetic foot ulcers x2 right foot 3.  Diabetes mellitus type 2; uncontrolled; with peripheral polyneuropathy   Plan of Care:  1. Patient was evaluated.  Medically necessary excisional debridement including subcutaneous tissue was performed using a tissue nipper.  Excisional debridement of all necrotic nonviable tissue down to healthier bleeding viable tissue was performed with postdebridement measurement sinus. 2.  Since there is no graft present today we will go ahead and allow the patient to change the dressings this week with dry sterile dressings daily.  Recommend daily foot soaks and Epson salt with Betadine. 3.  Continue minimal weightbearing 4.  Return to clinic in 1 week for graft reapplication  Edrick Kins, DPM [facility]  Dr. Edrick Kins, DPM    [address], [place] [address]                Office [phone]  Fax [phone]

## 2021-11-08 NOTE — Progress Notes (Signed)
Name: [name]   MRN: [id number]    DOB: 12-15-1965   Date:11/09/2021       Progress Note  Subjective  Chief Complaint  Medication Refill  HPI  DMII: he is not compliant with office visits and has been out of medications for the past 2 weeks. He had a foot infection, amputation of 5 th toe and ulceration of foot. He is still under the care of Dr. Amalia Hailey, wearing a boot today on right foot. He states glucose at home has been high 100 to low 200 's. He denies polyphagia, polydipsia or polyuria. He has associated HTN, dyslipidemia, albuminuria, PAD. Explained importance of dietary changes and compliance with visits and medications. We will try Rybelsus, lower dose of glipizide, Actos and Jardiance . Return in 2 weeks for follow up   Macro albuminuria with secondary hyperparathyroidism: we will recheck labs today , he lost to follow up with nephrologist . He denies pruritis,. He has vitamin D deficiency and we will recheck labs for that also  Anemia: high ferritin and low B12, we will recheck levels, denies pica.    HTN: He is currently off all his bp medication, he was on Bystolic 5 mg and Diovan 726/20 mg , today bp is only 140/84, we will resume only ARB - Diovan 160 mg without HCTZ and follow up in 6 weeks for follow up   Dyslipidemia: he states he still has Crestor at home, last rx was in March and only 90 pills, we will recheck labs. He denies side effects    Patient Active Problem List   Diagnosis Date Noted   Gangrene of right foot ([place])    Cellulitis of right foot 04/12/2021   PAD (peripheral artery disease) ([place]) 04/12/2021   AKI (acute kidney injury) ([place]) 04/12/2021   Anemia 04/12/2021   Localized edema 10/14/2019   Proteinuria 10/14/2019   Secondary renal hyperparathyroidism ([place]) 03/19/2019   Diabetic ulcer of foot with fat layer exposed ([place]) 12/18/2018   Type 2 diabetes mellitus with chronic kidney disease and hypertension 12/18/2018   Dyslipidemia associated with type  2 diabetes mellitus ([place]) 07/03/2018   Vitamin D deficiency 04/11/2018   ED (erectile dysfunction) 04/10/2018   Lower urinary tract symptoms (LUTS) 02/09/2017   Hypertension 07/05/2015   Hyperglycemia due to type 2 diabetes mellitus ([place]) 07/05/2015   HLD (hyperlipidemia) 07/05/2015    Past Surgical History:  Procedure Laterality Date   AMPUTATION Right 04/16/2021   Procedure: AMPUTATION  RIGHT 5TH RAY, INCISION AND DRAINAGE;  Surgeon: Edrick Kins, DPM;  Location: [facility];  Service: Podiatry;  Laterality: Right;   APPLICATION OF WOUND VAC Right 04/21/2021   Procedure: APPLICATION OF WOUND VAC;  Surgeon: Edrick Kins, DPM;  Location: [facility];  Service: Podiatry;  Laterality: Right;   BONE BIOPSY Left 06/27/2020   Procedure: BONE BIOPSY;  Surgeon: Caroline More, DPM;  Location: [facility];  Service: Podiatry;  Laterality: Left;   INCISION AND DRAINAGE Left 06/27/2020   Procedure: INCISION AND DRAINAGE;  Surgeon: Caroline More, DPM;  Location: [facility];  Service: Podiatry;  Laterality: Left;   IRRIGATION AND DEBRIDEMENT FOOT Right 04/21/2021   Procedure: IRRIGATION AND DEBRIDEMENT FOOT;  Surgeon: Edrick Kins, DPM;  Location: [facility];  Service: Podiatry;  Laterality: Right;   LOWER EXTREMITY ANGIOGRAPHY Left 06/30/2020   Procedure: Lower Extremity Angiography;  Surgeon: Algernon Huxley, MD;  Location: [facility];  Service: Cardiovascular;  Laterality: Left;   LOWER EXTREMITY ANGIOGRAPHY  Right 04/14/2021   Procedure: Lower Extremity Angiography;  Surgeon: Algernon Huxley, MD;  Location: [facility];  Service: Cardiovascular;  Laterality: Right;   NO PAST SURGERIES     WOUND DEBRIDEMENT Right 04/19/2021   Procedure: DEBRIDEMENT WOUND;  Surgeon: Edrick Kins, DPM;  Location: [facility];  Service: Podiatry;  Laterality: Right;    Family History  Problem Relation Age of Onset   Heart disease Father    Heart attack Father    Healthy Mother    Prostate cancer Neg Hx    Kidney  cancer Neg Hx    Bladder Cancer Neg Hx     Social History   Tobacco Use   Smoking status: Never   Smokeless tobacco: Never  Substance Use Topics   Alcohol use: Yes    Alcohol/week: 6.0 - 12.0 standard drinks    Types: 6 - 12 Cans of beer per week    Comment: Occasional     Current Outpatient Medications:    ACCU-CHEK GUIDE test strip, USE TO TEST BLOOD SUGAR UP TO FOUR TIMES DAILY AS DIRECTED, Disp: 300 strip, Rfl: 1   Accu-Chek Softclix Lancets lancets, USE TO TEST BLOOD SUGAR UP TO FOUR TIMES DAILY AS DIRECTED, Disp: 300 each, Rfl: 1   aspirin EC 81 MG tablet, Take 81 mg by mouth daily., Disp: , Rfl:    Blood Glucose Calibration (CONTOUR NEXT CONTROL) Normal SOLN, 1 Bottle by In Vitro route every 30 (thirty) days., Disp: 1 each, Rfl: 3   blood glucose meter kit and supplies, 1 each by Other route in the morning, at noon, and at bedtime. Dispense based on patient and insurance preference. Use up to four times daily as directed. (FOR ICD-10 E10.9, E11.9)., Disp: 1 each, Rfl: 0   Cyanocobalamin (B-12) 1000 MCG SUBL, Place 1 tablet under the tongue daily., Disp: 100 tablet, Rfl: 1   Semaglutide (RYBELSUS) 7 MG TABS, Take 7 mg by mouth daily., Disp: 90 tablet, Rfl: 0   torsemide (DEMADEX) 10 MG tablet, Take 10 mg by mouth daily., Disp: , Rfl:    valsartan (DIOVAN) 160 MG tablet, Take 1 tablet (160 mg total) by mouth daily., Disp: 90 tablet, Rfl: 0   empagliflozin (JARDIANCE) 25 MG TABS tablet, Take 1 tablet (25 mg total) by mouth daily., Disp: 90 tablet, Rfl: 0   glipiZIDE (GLUCOTROL XL) 5 MG 24 hr tablet, Take 1 tablet (5 mg total) by mouth daily with breakfast., Disp: 90 tablet, Rfl: 0   pioglitazone (ACTOS) 15 MG tablet, Take 1 tablet (15 mg total) by mouth daily., Disp: 90 tablet, Rfl: 0   rosuvastatin (CRESTOR) 40 MG tablet, Take 1 tablet (40 mg total) by mouth daily., Disp: 90 tablet, Rfl: 0   Vitamin D, Ergocalciferol, (DRISDOL) 1.25 MG (50000 UNIT) CAPS capsule, Take 1 capsule  (50,000 Units total) by mouth every 7 (seven) days., Disp: 12 capsule, Rfl: 0  No Known Allergies  I personally reviewed active problem list, medication list, allergies, family history, social history, health maintenance with the patient/caregiver today.   ROS  Constitutional: Negative for fever , positive for  weight change.  Respiratory: Negative for cough and shortness of breath.   Cardiovascular: Negative for chest pain or palpitations.  Gastrointestinal: Negative for abdominal pain, no bowel changes.  Musculoskeletal: Negative for gait problem or joint swelling.  Skin:positive for rash right lowe leg, anterior tibia - likely from friction no signs of infection  Neurological: Negative for dizziness or headache.  No  other specific complaints in a complete review of systems (except as listed in HPI above).   Objective  Vitals:   11/09/21 0825  BP: 140/84  Pulse: 91  Resp: 16  Temp: 97.8 F (36.6 C)  SpO2: 99%  Weight: 225 lb (102.1 kg)  Height: _0  (1.803 m)    Body mass index is 31.38 kg/m.  Physical Exam  Constitutional: Patient appears well-developed and well-nourished. Obese  No distress.  HEENT: head atraumatic, normocephalic, pupils equal and reactive to light,neck supple Cardiovascular: Normal rate, regular rhythm and normal heart sounds.  No murmur heard. No BLE edema. Pulmonary/Chest: Effort normal and breath sounds normal. No respiratory distress. Abdominal: Soft.  There is no tenderness. Psychiatric: Patient has a normal mood and affect. behavior is normal. Judgment and thought content normal.    PHQ2/9: Depression screen [facility] 2/9 11/09/2021 04/28/2021 02/09/2021 10/11/2020 02/27/2020  Decreased Interest 0 0 0 0 0  Down, Depressed, Hopeless 0 0 0 0 0  PHQ - 2 Score 0 0 0 0 0  Altered sleeping 0 - - - 0  Tired, decreased energy 0 - - - 0  Change in appetite 0 - - - 0  Feeling bad or failure about yourself  0 - - - 0  Trouble concentrating 0 - - - 0   Moving slowly or fidgety/restless 0 - - - 0  Suicidal thoughts 0 - - - 0  PHQ-9 Score 0 - - - 0  Difficult doing work/chores - - - - Not difficult at all    phq 9 is negative   Fall Risk: Fall Risk  11/09/2021 04/28/2021 02/09/2021 10/11/2020 02/27/2020  Falls in the past year? 0 0 0 0 0  Number falls in past yr: 0 0 0 0 0  Injury with Fall? 0 0 0 0 0  Risk for fall due to : No Fall Risks - - - -  Follow up Falls prevention discussed - - - -      Functional Status Survey: Is the patient deaf or have difficulty hearing?: No Does the patient have difficulty seeing, even when wearing glasses/contacts?: No Does the patient have difficulty concentrating, remembering, or making decisions?: No Does the patient have difficulty walking or climbing stairs?: No Does the patient have difficulty dressing or bathing?: No Does the patient have difficulty doing errands alone such as visiting a doctor's office or shopping?: No    Assessment & Plan  1. Type 2 diabetes with complication (HCC)  - POCT HgB A1C - Microalbumin / creatinine urine ratio - COMPLETE METABOLIC PANEL WITH GFR - glipiZIDE (GLUCOTROL XL) 5 MG 24 hr tablet; Take 1 tablet (5 mg total) by mouth daily with breakfast.  Dispense: 90 tablet; Refill: 0 - empagliflozin (JARDIANCE) 25 MG TABS tablet; Take 1 tablet (25 mg total) by mouth daily.  Dispense: 90 tablet; Refill: 0 - Semaglutide (RYBELSUS) 7 MG TABS; Take 7 mg by mouth daily.  Dispense: 90 tablet; Refill: 0  2. PAD (peripheral artery disease) ([place])  Discussed follow up with vascular surgeon but he is not interested at this time  3. Dyslipidemia associated with type 2 diabetes mellitus (HCC)  - Lipid panel - glipiZIDE (GLUCOTROL XL) 5 MG 24 hr tablet; Take 1 tablet (5 mg total) by mouth daily with breakfast.  Dispense: 90 tablet; Refill: 0 - empagliflozin (JARDIANCE) 25 MG TABS tablet; Take 1 tablet (25 mg total) by mouth daily.  Dispense: 90 tablet; Refill: 0 -  pioglitazone (ACTOS) 15 MG  tablet; Take 1 tablet (15 mg total) by mouth daily.  Dispense: 90 tablet; Refill: 0 - rosuvastatin (CRESTOR) 40 MG tablet; Take 1 tablet (40 mg total) by mouth daily.  Dispense: 90 tablet; Refill: 0  4. Diabetic foot infection ([place])  He states recently released from ID   5. Essential hypertension  - COMPLETE METABOLIC PANEL WITH GFR - CBC with Differential/Platelet - valsartan (DIOVAN) 160 MG tablet; Take 1 tablet (160 mg total) by mouth daily.  Dispense: 90 tablet; Refill: 0  6. History of amputation of lesser toe, left ([place])   7. Vitamin D deficiency  - Vitamin D, Ergocalciferol, (DRISDOL) 1.25 MG (50000 UNIT) CAPS capsule; Take 1 capsule (50,000 Units total) by mouth every 7 (seven) days.  Dispense: 12 capsule; Refill: 0 - VITAMIN D 25 Hydroxy (Vit-D Deficiency, Fractures)  8. B12 deficiency  - Vitamin B12  9. Secondary renal hyperparathyroidism (HCC)  - Microalbumin / creatinine urine ratio - Parathyroid hormone, intact (no Ca)  10. Type 2 diabetes mellitus with obesity (HCC)  - glipiZIDE (GLUCOTROL XL) 5 MG 24 hr tablet; Take 1 tablet (5 mg total) by mouth daily with breakfast.  Dispense: 90 tablet; Refill: 0 - empagliflozin (JARDIANCE) 25 MG TABS tablet; Take 1 tablet (25 mg total) by mouth daily.  Dispense: 90 tablet; Refill: 0  11. Elevated ferritin  - Iron, TIBC and Ferritin Panel  12. Hypertension associated with diabetes ([place])  - Microalbumin / creatinine urine ratio - COMPLETE METABOLIC PANEL WITH GFR    12. Needs flu shot  refused  13. Need for shingles vaccine  - Zoster Vaccine Adjuvanted [facility]) injection; Inject 0.5 mLs into the muscle once for 1 dose.  Dispense: 0.5 mL; Refill: 1

## 2021-11-09 ENCOUNTER — Encounter: Payer: Self-pay | Admitting: Family Medicine

## 2021-11-09 ENCOUNTER — Ambulatory Visit (INDEPENDENT_AMBULATORY_CARE_PROVIDER_SITE_OTHER): Payer: Managed Care, Other (non HMO) | Admitting: Family Medicine

## 2021-11-09 VITALS — BP 140/84 | HR 91 | Temp 97.8°F | Resp 16 | Ht 71.0 in | Wt 225.0 lb

## 2021-11-09 DIAGNOSIS — I10 Essential (primary) hypertension: Secondary | ICD-10-CM | POA: Diagnosis not present

## 2021-11-09 DIAGNOSIS — E118 Type 2 diabetes mellitus with unspecified complications: Secondary | ICD-10-CM | POA: Diagnosis not present

## 2021-11-09 DIAGNOSIS — E669 Obesity, unspecified: Secondary | ICD-10-CM

## 2021-11-09 DIAGNOSIS — E1159 Type 2 diabetes mellitus with other circulatory complications: Secondary | ICD-10-CM

## 2021-11-09 DIAGNOSIS — I739 Peripheral vascular disease, unspecified: Secondary | ICD-10-CM

## 2021-11-09 DIAGNOSIS — I152 Hypertension secondary to endocrine disorders: Secondary | ICD-10-CM

## 2021-11-09 DIAGNOSIS — Z23 Encounter for immunization: Secondary | ICD-10-CM

## 2021-11-09 DIAGNOSIS — N2581 Secondary hyperparathyroidism of renal origin: Secondary | ICD-10-CM

## 2021-11-09 DIAGNOSIS — E559 Vitamin D deficiency, unspecified: Secondary | ICD-10-CM

## 2021-11-09 DIAGNOSIS — E538 Deficiency of other specified B group vitamins: Secondary | ICD-10-CM

## 2021-11-09 DIAGNOSIS — E1169 Type 2 diabetes mellitus with other specified complication: Secondary | ICD-10-CM

## 2021-11-09 DIAGNOSIS — R7989 Other specified abnormal findings of blood chemistry: Secondary | ICD-10-CM

## 2021-11-09 DIAGNOSIS — Z89422 Acquired absence of other left toe(s): Secondary | ICD-10-CM

## 2021-11-09 DIAGNOSIS — E785 Hyperlipidemia, unspecified: Secondary | ICD-10-CM

## 2021-11-09 LAB — POCT GLYCOSYLATED HEMOGLOBIN (HGB A1C): Hemoglobin A1C: 8.9 % — AB (ref 4.0–5.6)

## 2021-11-09 MED ORDER — GLIPIZIDE ER 5 MG PO TB24: 5.0000 mg | ORAL_TABLET | Freq: Every day | ORAL | 0 refills | Status: DC

## 2021-11-09 MED ORDER — VITAMIN D (ERGOCALCIFEROL) 1.25 MG (50000 UNIT) PO CAPS: 50000.0000 [IU] | ORAL_CAPSULE | ORAL | 0 refills | Status: DC

## 2021-11-09 MED ORDER — ROSUVASTATIN CALCIUM 40 MG PO TABS: 40.0000 mg | ORAL_TABLET | Freq: Every day | ORAL | 0 refills | Status: DC

## 2021-11-09 MED ORDER — EMPAGLIFLOZIN 25 MG PO TABS: 25.0000 mg | ORAL_TABLET | Freq: Every day | ORAL | 0 refills | Status: DC

## 2021-11-09 MED ORDER — SHINGRIX 50 MCG/0.5ML IM SUSR: 0.5000 mL | Freq: Once | INTRAMUSCULAR | 1 refills | Status: AC

## 2021-11-09 MED ORDER — PIOGLITAZONE HCL 15 MG PO TABS: 15.0000 mg | ORAL_TABLET | Freq: Every day | ORAL | 0 refills | Status: DC

## 2021-11-09 MED ORDER — RYBELSUS 7 MG PO TABS: 7.0000 mg | ORAL_TABLET | Freq: Every day | ORAL | 0 refills | Status: DC

## 2021-11-09 MED ORDER — VALSARTAN 160 MG PO TABS: 160.0000 mg | ORAL_TABLET | Freq: Every day | ORAL | 0 refills | Status: DC

## 2021-11-09 NOTE — Progress Notes (Signed)
   Subjective:  Patient presents today status post multiple irrigations and debridements of a severe left foot cellulitis/abscess secondary to diabetes mellitus; h/o uncontrolled.  Last inpatient surgery was performed 04/21/2021 where irrigation debridement was performed with application of negative pressure wound VAC.  DOS: 04/21/2021.  While inpatient below-knee amputation was recommended however the patient refused.   Patient has had routine application of Apligraf and demonstrated significant improvement of the wounds.  Patient presents today to have graft reapplication performed. Past Medical History:  Diagnosis Date   Diabetes mellitus without complication ([place])    Hyperlipidemia    Hypertension      Objective/Physical Exam Wound #1: Large open wound across the plantar aspect of the midfoot that measures approximately 10.0x3.5 x 0.2 cm.  Significant improvement since last visit.  There continues to be increased central bridging of the wound is noted with good healthy viable skin.  Wound #2 noted to the dorsal lateral aspect of the foot has healed.  Complete reepithelialization has occurred.  Wound #3 noted to the first interdigital webspace measuring approximately 2.0 cm in length x0.5 cm in width x1 cm in depth.  Currently there is no identifiable exposed bone it appears granulation tissue has covered these areas.  Hypergranular tissue noted.  Improved serosanguineous drainage noted.  Intermittent fibrotic tissue also noted along the wound edges.  There is capillary refill to the toes.  Assessment: 1. s/p multiple irrigation and debridements with subsequent application of wound VAC. DOS: 04/21/2021 2.  Diabetic foot ulcers x2 right foot 3.  Diabetes mellitus type 2; uncontrolled; with peripheral polyneuropathy   Plan of Care:  1. Patient was evaluated. 2.  Medically necessary application of 88 cm of Apligraf was applied to the wounds to a total wound measurement area of 108 cm.   Reinforced and secured with Steri-Strips followed by dry sterile dressing.  Please note this graft is medically necessary.  The patient has failed all conservative modalities and graft is warranted at this time to aid in wound healing. 3.  Instructions to keep clean dry and intact x1 week 4.  Return to clinic in 1 week for reevaluation  Apligraf LOT [id number] EXP 2021-11-02  Apligraf LOT [id number] EXP 2021-11-02   Edrick Kins, DPM [facility]  Dr. Edrick Kins, DPM    [address], [place] [address]                Office [phone]  Fax [phone]

## 2021-11-10 LAB — COMPLETE METABOLIC PANEL WITHOUT GFR
AG Ratio: 1.1 (calc) (ref 1.0–2.5)
ALT: 13 U/L (ref 9–46)
AST: 10 U/L (ref 10–35)
Albumin: 3.2 g/dL — ABNORMAL LOW (ref 3.6–5.1)
Alkaline phosphatase (APISO): 70 U/L (ref 35–144)
BUN: 18 mg/dL (ref 7–25)
CO2: 26 mmol/L (ref 20–32)
Calcium: 9.2 mg/dL (ref 8.6–10.3)
Chloride: 100 mmol/L (ref 98–110)
Creat: 0.79 mg/dL (ref 0.70–1.30)
Globulin: 2.9 g/dL (ref 1.9–3.7)
Glucose, Bld: 282 mg/dL — ABNORMAL HIGH (ref 65–99)
Potassium: 4.4 mmol/L (ref 3.5–5.3)
Sodium: 136 mmol/L (ref 135–146)
Total Bilirubin: 0.9 mg/dL (ref 0.2–1.2)
Total Protein: 6.1 g/dL (ref 6.1–8.1)
eGFR: 106 mL/min/1.73m2

## 2021-11-10 LAB — CBC WITH DIFFERENTIAL/PLATELET
Absolute Monocytes: 378 {cells}/uL (ref 200–950)
Basophils Absolute: 37 {cells}/uL (ref 0–200)
Basophils Relative: 0.6 %
Eosinophils Absolute: 68 {cells}/uL (ref 15–500)
Eosinophils Relative: 1.1 %
HCT: 42.7 % (ref 38.5–50.0)
Hemoglobin: 14.1 g/dL (ref 13.2–17.1)
Lymphs Abs: 936 {cells}/uL (ref 850–3900)
MCH: 30.7 pg (ref 27.0–33.0)
MCHC: 33 g/dL (ref 32.0–36.0)
MCV: 92.8 fL (ref 80.0–100.0)
MPV: 10 fL (ref 7.5–12.5)
Monocytes Relative: 6.1 %
Neutro Abs: 4780 {cells}/uL (ref 1500–7800)
Neutrophils Relative %: 77.1 %
Platelets: 273 Thousand/uL (ref 140–400)
RBC: 4.6 Million/uL (ref 4.20–5.80)
RDW: 11.7 % (ref 11.0–15.0)
Total Lymphocyte: 15.1 %
WBC: 6.2 Thousand/uL (ref 3.8–10.8)

## 2021-11-10 LAB — MICROALBUMIN / CREATININE URINE RATIO
Creatinine, Urine: 42 mg/dL (ref 20–320)
Microalb Creat Ratio: 8462 ug/mg{creat} — ABNORMAL HIGH
Microalb, Ur: 355.4 mg/dL

## 2021-11-10 LAB — LIPID PANEL
Cholesterol: 168 mg/dL
HDL: 71 mg/dL
LDL Cholesterol (Calc): 79 mg/dL
Non-HDL Cholesterol (Calc): 97 mg/dL
Total CHOL/HDL Ratio: 2.4 (calc)
Triglycerides: 96 mg/dL

## 2021-11-10 LAB — PARATHYROID HORMONE, INTACT (NO CA): PTH: 72 pg/mL (ref 16–77)

## 2021-11-10 LAB — IRON,TIBC AND FERRITIN PANEL
%SAT: 13 % (calc) — ABNORMAL LOW (ref 20–48)
Ferritin: 68 ng/mL (ref 38–380)
Iron: 42 ug/dL — ABNORMAL LOW (ref 50–180)
TIBC: 314 mcg/dL (calc) (ref 250–425)

## 2021-11-10 LAB — VITAMIN B12: Vitamin B-12: 404 pg/mL (ref 200–1100)

## 2021-11-10 LAB — VITAMIN D 25 HYDROXY (VIT D DEFICIENCY, FRACTURES): Vit D, 25-Hydroxy: 50 ng/mL (ref 30–100)

## 2021-11-15 ENCOUNTER — Encounter (INDEPENDENT_AMBULATORY_CARE_PROVIDER_SITE_OTHER): Payer: Self-pay

## 2021-11-15 ENCOUNTER — Telehealth: Payer: Self-pay | Admitting: Podiatry

## 2021-11-15 ENCOUNTER — Ambulatory Visit (INDEPENDENT_AMBULATORY_CARE_PROVIDER_SITE_OTHER): Payer: Managed Care, Other (non HMO) | Admitting: Podiatry

## 2021-11-15 ENCOUNTER — Other Ambulatory Visit: Payer: Self-pay

## 2021-11-15 DIAGNOSIS — L97512 Non-pressure chronic ulcer of other part of right foot with fat layer exposed: Secondary | ICD-10-CM | POA: Diagnosis not present

## 2021-11-15 DIAGNOSIS — E0843 Diabetes mellitus due to underlying condition with diabetic autonomic (poly)neuropathy: Secondary | ICD-10-CM | POA: Diagnosis not present

## 2021-11-15 NOTE — Telephone Encounter (Signed)
Cecille Rubin with [name] called , they have put his home services on hold because of him getting the skin grafts, should they resume services tomorrow or keep them on hold until a later date? Please advise

## 2021-11-15 NOTE — Telephone Encounter (Signed)
Keep on hold. Graft was reapplied today. - Dr. Amalia Hailey

## 2021-11-15 NOTE — Progress Notes (Signed)
   Subjective:  Patient presents today status post multiple irrigations and debridements of a severe left foot cellulitis/abscess secondary to diabetes mellitus; h/o uncontrolled.  Last inpatient surgery was performed 04/21/2021 where irrigation debridement was performed with application of negative pressure wound VAC.  DOS: 04/21/2021.  While inpatient below-knee amputation was recommended however the patient refused.   Patient has had routine application of Apligraf and demonstrated significant improvement of the wounds.  Patient presents today to have graft reapplication performed. Past Medical History:  Diagnosis Date   Diabetes mellitus without complication ([place])    Hyperlipidemia    Hypertension      Objective/Physical Exam Wound #1: Large open wound across the plantar aspect of the midfoot that has created a good healthy viable skin bridge across the central portion of the wound.  The most proximal wound measures approximately 10 cm x 3.5 cm.  There continues to demonstrate good improvement in healing of the wound.  Wound #2 noted to the dorsal lateral aspect of the foot has healed.  Complete reepithelialization has occurred.  Wound #3 noted to the first interdigital webspace measuring approximately 1.0 cm in length x0.5 cm in width x 0.5 cm in depth.  Currently there is no identifiable exposed bone it appears granulation tissue has covered these areas.  Hypergranular tissue noted.  Improved serosanguineous drainage noted.  Intermittent fibrotic tissue also noted along the wound edges.  There is capillary refill to the toes.  Assessment: 1. s/p multiple irrigation and debridements with subsequent application of wound VAC. DOS: 04/21/2021 2.  Diabetic foot ulcers x2 right foot 3.  Diabetes mellitus type 2; uncontrolled; with peripheral polyneuropathy   Plan of Care:  1. Patient was evaluated. 2.  Medically necessary application of 44 cm of Apligraf was applied to the wounds to a  total wound measurement area of 40   cm.  Reinforced and secured with Steri-Strips followed by dry sterile dressing.  Please note this graft is medically necessary.  The patient has failed all conservative modalities and graft is warranted at this time to aid in wound healing. 3.  Instructions to keep clean dry and intact x1 week 4.  Return to clinic in 1 week for reevaluation  Apligraf LOT [id number] EXP 2021-11-24    Edrick Kins, DPM [facility]  Dr. Edrick Kins, DPM    [address], [place] [address]                Office [phone]  Fax [phone]

## 2021-11-16 NOTE — Telephone Encounter (Signed)
Notified [employer]

## 2021-11-22 ENCOUNTER — Ambulatory Visit (INDEPENDENT_AMBULATORY_CARE_PROVIDER_SITE_OTHER): Payer: Managed Care, Other (non HMO) | Admitting: Podiatry

## 2021-11-22 ENCOUNTER — Other Ambulatory Visit: Payer: Self-pay

## 2021-11-22 DIAGNOSIS — L97512 Non-pressure chronic ulcer of other part of right foot with fat layer exposed: Secondary | ICD-10-CM

## 2021-11-22 DIAGNOSIS — E0843 Diabetes mellitus due to underlying condition with diabetic autonomic (poly)neuropathy: Secondary | ICD-10-CM

## 2021-11-22 NOTE — Progress Notes (Signed)
° °  Subjective:  Patient presents today status post multiple irrigations and debridements of a severe left foot cellulitis/abscess secondary to diabetes mellitus; h/o uncontrolled.  Last inpatient surgery was performed 04/21/2021 where irrigation debridement was performed with application of negative pressure wound VAC.  DOS: 04/21/2021.  While inpatient below-knee amputation was recommended however the patient refused.   Patient had Apligraf applications and there continues to be significant improvement of the wound at that time.  Patient presents today for follow-up evaluation and treatment  Past Medical History:  Diagnosis Date   Diabetes mellitus without complication ([place])    Hyperlipidemia    Hypertension     Objective/Physical Exam Wound #1: Large open plantar wound has a significant amount of reepithelialization across the plantar aspect of the wound.  The plantar wound to the right foot is at about 50% healed.  Good reepithelialization has occurred to the distal portion of the wound.  There remains a very granular portion to the proximal wound that remains.  moderate serous drainage.  Periwound integrity is intact  Wound #2 noted to the dorsal lateral aspect of the foot has healed.  Complete reepithelialization has occurred.  Wound #3 noted to the first interdigital webspace measuring approximately 0.5 x 0.2 x 0.2 cm in depth.  The wound is very stable and mostly unchanged since last visit  Currently there is no identifiable exposed bone it appears granulation tissue has covered these areas.  Hypergranular tissue noted.  Improved serosanguineous drainage noted.  Intermittent fibrotic tissue also noted along the wound edges.  There is capillary refill to the toes.  Radiographic exam RT foot 11/08/2021: Absence of the fifth ray noted.  Adductovarus deformity noted.  Diffuse degenerative changes noted throughout the TMT and MTP joints.  There is concern for chronic osteomyelitis however  there is no significant degenerative changes since x-rays were taken 08/09/2021.  There actually appears to be some osseous stabilization and restructuring along the fourth metatarsal.  Assessment: 1. s/p multiple irrigation and debridements with subsequent application of wound VAC. DOS: 04/21/2021 2.  Diabetic foot ulcers x2 right foot 3.  Diabetes mellitus type 2; uncontrolled; with peripheral polyneuropathy   Plan of Care:  1. Patient was evaluated.  Medically necessary excisional debridement including muscle and deep fascial tissue was performed using a tissue nipper.  Excisional debridement of all necrotic nonviable tissue down to healthier bleeding viable tissue was performed with postdebridement measurement sinus. 2.  allow the patient to change the dressings this week with dry sterile dressings daily.  Recommend daily foot soaks and Epson salt with Betadine. 3.  Continue minimal weightbearing 4.  Return to clinic in 1 week for graft reapplication  Edrick Kins, DPM [facility]  Dr. Edrick Kins, DPM    [address], [place] [address]                Office [phone]  Fax [phone]

## 2021-11-29 ENCOUNTER — Ambulatory Visit (INDEPENDENT_AMBULATORY_CARE_PROVIDER_SITE_OTHER): Payer: Managed Care, Other (non HMO) | Admitting: Podiatry

## 2021-11-29 ENCOUNTER — Other Ambulatory Visit: Payer: Self-pay

## 2021-11-29 DIAGNOSIS — E0843 Diabetes mellitus due to underlying condition with diabetic autonomic (poly)neuropathy: Secondary | ICD-10-CM

## 2021-11-29 DIAGNOSIS — L97512 Non-pressure chronic ulcer of other part of right foot with fat layer exposed: Secondary | ICD-10-CM

## 2021-11-29 NOTE — Progress Notes (Signed)
Subjective:  Patient presents today status post multiple irrigations and debridements of a severe left foot cellulitis/abscess secondary to diabetes mellitus; h/o uncontrolled.  Last inpatient surgery was performed 04/21/2021 where irrigation debridement was performed with application of negative pressure wound VAC.  DOS: 04/21/2021.  While inpatient below-knee amputation was recommended however the patient refused.   Patient has now had 5 applications of the graft to the right foot.  He was only approved for insurance for 5 applications.  We did not apply any graft last week or today.  He presents for follow-up treatment and evaluation  Past Medical History:  Diagnosis Date   Diabetes mellitus without complication ([place])    Hyperlipidemia    Hypertension      Objective/Physical Exam Wound #1: Along the plantar aspect of the right foot has demonstrated significant improvement and reepithelialization of skin.  Graft incorporation has demonstrated great reduction of the open wound.  Please see above noted photo.  There is a portion of full-thickness wound to the plantar lateral aspect of the midfoot with serosanguineous drainage.  Granular wound base noted.  Periwound is macerated and callused.  No significant malodor noted.  There is no exposed bone muscle tendon ligament or joint.  Wound #2 noted to the dorsal lateral aspect of the foot has healed.  Complete reepithelialization has occurred.  Wound #3 noted to the first interdigital webspace measuring approximately 0.3 x 0.1 x 0.2 cm in depth.  The wound is very stable and continues to demonstrate slow steady improvement  Currently there is no identifiable exposed bone it appears granulation tissue has covered these areas.  Hypergranular tissue noted.  Improved serosanguineous drainage noted.  Intermittent fibrotic tissue also noted along the wound edges.  There is capillary refill to the toes.  Radiographic exam RT foot 11/08/2021: Absence  of the fifth ray noted.  Adductovarus deformity noted.  Diffuse degenerative changes noted throughout the TMT and MTP joints.  There is concern for chronic osteomyelitis however there is no significant degenerative changes since x-rays were taken 08/09/2021.  There actually appears to be some osseous stabilization and restructuring along the fourth metatarsal.  Assessment: 1. s/p multiple irrigation and debridements with subsequent application of wound VAC. DOS: 04/21/2021 2.  Diabetic foot ulcers x2 right foot 3.  Diabetes mellitus type 2; uncontrolled; with peripheral polyneuropathy   Plan of Care:  1. Patient was evaluated.  Medically necessary excisional debridement including subcutaneous tissue was performed using a tissue nipper.  Excisional debridement of all necrotic nonviable tissue down to healthier bleeding viable tissue was performed with postdebridement measurement same as pre- 2.  According to graft records the patient has only been approved for 5 applications of the graft.  We have exhausted the 5 applications.  We will go ahead and request new authorization to apply additional grafts.  The patient has done incredibly well with the grafts with drastic improvement of the wound.  3.  In the meantime continue dry sterile dressings 4.  Return to clinic in 2 weeks  Edrick Kins, DPM [facility]  Dr. Edrick Kins, DPM    [address] [place] [address]                Office [phone]  Fax (  [phone] ° ° ° ° ° ° ° ° ° ° ° ° °

## 2021-11-30 ENCOUNTER — Telehealth: Payer: Self-pay | Admitting: *Deleted

## 2021-11-30 NOTE — Telephone Encounter (Signed)
"  Calling about some outstanding orders for [name] for Dr. Daylene Katayama.  I spoke to [name] last week about these same orders.  She asked me to re-fax them.  I did that and we still have not received them back signed and dated.  Now it's 1-2 months, that's Medicare compliance.  Give me a call back as soon as possible."

## 2021-12-04 NOTE — Telephone Encounter (Signed)
Delydia I won't be back in the [place] office until this next Friday. Could you have them refax it and have me sign it Friday? Thanks and Merry Christmas!!!

## 2021-12-08 ENCOUNTER — Telehealth: Payer: Self-pay | Admitting: *Deleted

## 2021-12-08 NOTE — Telephone Encounter (Signed)
"  I'm calling about an outstanding orders we have for [name].  Call me back as soon as possible."

## 2021-12-09 ENCOUNTER — Telehealth: Payer: Self-pay | Admitting: Family Medicine

## 2021-12-09 DIAGNOSIS — E118 Type 2 diabetes mellitus with unspecified complications: Secondary | ICD-10-CM

## 2021-12-13 ENCOUNTER — Other Ambulatory Visit: Payer: Self-pay

## 2021-12-13 ENCOUNTER — Ambulatory Visit (INDEPENDENT_AMBULATORY_CARE_PROVIDER_SITE_OTHER): Payer: Managed Care, Other (non HMO) | Admitting: Podiatry

## 2021-12-13 VITALS — Temp 98.0°F

## 2021-12-13 DIAGNOSIS — E0843 Diabetes mellitus due to underlying condition with diabetic autonomic (poly)neuropathy: Secondary | ICD-10-CM

## 2021-12-13 DIAGNOSIS — L97512 Non-pressure chronic ulcer of other part of right foot with fat layer exposed: Secondary | ICD-10-CM | POA: Diagnosis not present

## 2021-12-13 MED ORDER — RYBELSUS 7 MG PO TABS: 1.0000 | ORAL_TABLET | Freq: Every day | ORAL | 0 refills | Status: DC

## 2021-12-13 NOTE — Telephone Encounter (Signed)
Pt called saying the insurance only wants to pay for a 90 day supply.  It will not pay for 30 days.  [facility]  [address] [employer]

## 2021-12-13 NOTE — Addendum Note (Signed)
Addended by: Teodora Medici on: 12/13/2021 12:05 PM   Modules accepted: Orders

## 2021-12-15 ENCOUNTER — Other Ambulatory Visit: Payer: Self-pay

## 2021-12-15 ENCOUNTER — Ambulatory Visit: Payer: Self-pay | Admitting: *Deleted

## 2021-12-15 ENCOUNTER — Telehealth: Payer: Self-pay | Admitting: Family Medicine

## 2021-12-15 ENCOUNTER — Emergency Department
Admission: EM | Admit: 2021-12-15 | Discharge: 2021-12-15 | Disposition: A | Discharge status: Home / Self Care | Payer: Managed Care, Other (non HMO) | Attending: Emergency Medicine | Admitting: Emergency Medicine

## 2021-12-15 DIAGNOSIS — I10 Essential (primary) hypertension: Secondary | ICD-10-CM | POA: Insufficient documentation

## 2021-12-15 DIAGNOSIS — R030 Elevated blood-pressure reading, without diagnosis of hypertension: Secondary | ICD-10-CM | POA: Diagnosis present

## 2021-12-15 MED ORDER — VALSARTAN-HYDROCHLOROTHIAZIDE 320-25 MG PO TABS: 1.0000 | ORAL_TABLET | Freq: Every day | ORAL | 2 refills | Status: DC

## 2021-12-15 MED ORDER — NEBIVOLOL HCL 5 MG PO TABS: 5.0000 mg | ORAL_TABLET | Freq: Every day | ORAL | 2 refills | Status: DC

## 2021-12-15 NOTE — Telephone Encounter (Signed)
Call patient to inform him we do not have any samples in office, but did look up the medication through goodrx. The coupon for [facility] shows he will get at a lower cost $56.29. Pt states he understands and will give it a try.

## 2021-12-15 NOTE — ED Triage Notes (Signed)
Pt to ED for HTN, states recent change in meds NAD noted Denies pain/other complaints

## 2021-12-15 NOTE — ED Provider Notes (Signed)
° °  [facility] Provider Note    Event Date/Time   First MD Initiated Contact with Patient 12/15/21 1020     (approximate)   History   Hypertension   HPI  [name] is a 56 y.o. male who presents with complaints of high blood pressure.  Patient reports his blood pressures been higher over the last 1 to 2 months.  He recently had surgery of his right foot and his physician adjusted his blood pressure medications.  He feels well has no complaints.     Physical Exam   Triage Vital Signs: ED Triage Vitals [12/15/21 0921]  Enc Vitals Group     BP (!) 198/109     Pulse Rate 85     Resp 20     Temp 97.8 F (36.6 C)     Temp Source Oral     SpO2 97 %     Weight 98 kg (216 lb)     Height 1.803 m (5\' 11" )     Head Circumference      Peak Flow      Pain Score 0     Pain Loc      Pain Edu?      Excl. in [place]?     Most recent vital signs: Vitals:   12/15/21 0921  BP: (!) 198/109  Pulse: 85  Resp: 20  Temp: 97.8 F (36.6 C)  SpO2: 97%     General: Awake, no distress.  CV:  Good peripheral perfusion.  Resp:  Normal effort.  Abd:  No distention.  Other:     ED Results / Procedures / Treatments   Labs (all labs ordered are listed, but only abnormal results are displayed) Labs Reviewed - No data to display   EKG    RADIOLOGY     PROCEDURES:  Critical Care performed:   Procedures   MEDICATIONS ORDERED IN ED: Medications - No data to display   IMPRESSION / MDM / [place] / ED COURSE  I reviewed the triage vital signs and the nursing notes.  Patient presents with asymptomatic elevated blood pressure.  Review of records demonstrates his blood pressure has been elevated over the last month.  Reviewed PCP visit on November 30 where his physician held 2 of his blood pressure medications  Given the patient is asymptomatic and has no complaints we will restart the patient on his prior blood pressure medications which  seem to have managed his blood pressure well.  He will follow-up with his PCP.          FINAL CLINICAL IMPRESSION(S) / ED DIAGNOSES   Final diagnoses:  Hypertension, unspecified type     Rx / DC Orders   ED Discharge Orders          Ordered    nebivolol (BYSTOLIC) 5 MG tablet  Daily        12/15/21 1029    valsartan-hydrochlorothiazide (DIOVAN HCT) 320-25 MG tablet  Daily        12/15/21 1029             Note:  This document was prepared using Dragon voice recognition software and may include unintentional dictation errors.   Lavonia Drafts, MD 12/15/21 [phone]

## 2021-12-15 NOTE — Telephone Encounter (Signed)
°  Chief Complaint: elevated BP after medication change done Symptoms: BP 212/116 Frequency: This morning and since med change Pertinent Negatives: Patient denies headaches, dizziness, or visual changes.  Disposition: [] ED /[x] Urgent Care (no appt availability in office) / [] Appointment(In office/virtual)/ []  [place] [facility]/ [] Home Care/ [] Refused Recommended Disposition /[] [place] [facility]/ []  Follow-up with PCP Additional Notes: I called into the office and spoke with Melissa to see if he could be worked in today however Dr. Ancil Boozer is out all week and they are booked today.   Urgent recommended.   Pt was agreeable to going to the urgent care now.

## 2021-12-15 NOTE — Telephone Encounter (Signed)
Reason for Disposition  [5] Systolic BP  >= 537 OR Diastolic >= 482 AND [7] having NO cardiac or neurologic symptoms  Answer Assessment - Initial Assessment Questions 1. BLOOD PRESSURE: "What is the blood pressure?" "Did you take at least two measurements 5 minutes apart?"     Pt calling in  c/o BP being elevated.  Dr. Ancil Boozer changed my BP medications.   She put me on valsartan.   It's not working.   I've taken this before and it did not work.   I need something changed.  2. ONSET: "When did you take your blood pressure?"     This morning 212/116.  No headaches, dizziness or visual changes.  3. HOW: "How did you obtain the blood pressure?" (e.g., visiting nurse, automatic home BP monitor)     Home machine upper arm 4. HISTORY: "Do you have a history of high blood pressure?"     Yes 5. MEDICATIONS: "Are you taking any medications for blood pressure?" "Have you missed any doses recently?"     Valsartan 6. OTHER SYMPTOMS: "Do you have any symptoms?" (e.g., headache, chest pain, blurred vision, difficulty breathing, weakness)     Denies symptoms 7. PREGNANCY: "Is there any chance you are pregnant?" "When was your last menstrual period?"     *No Answer*  Protocols used: Blood Pressure - High-A-AH

## 2021-12-15 NOTE — Telephone Encounter (Signed)
The Rx for nebivolol (BYSTOLIC) 5 MG tablet will cost over $500 / pt asked if office has any coupons or samples of this medication or if another option can be prescribed / please advise / pt received this Rx from the ER

## 2021-12-19 NOTE — Telephone Encounter (Signed)
[name] faxed the orders to them last week.

## 2021-12-21 ENCOUNTER — Encounter: Payer: Self-pay | Admitting: Family Medicine

## 2021-12-21 ENCOUNTER — Ambulatory Visit: Payer: Managed Care, Other (non HMO) | Admitting: Family Medicine

## 2021-12-21 VITALS — BP 136/72 | HR 85 | Temp 98.2°F | Resp 16 | Ht 71.0 in | Wt 191.0 lb

## 2021-12-21 DIAGNOSIS — E118 Type 2 diabetes mellitus with unspecified complications: Secondary | ICD-10-CM | POA: Diagnosis not present

## 2021-12-21 DIAGNOSIS — H6123 Impacted cerumen, bilateral: Secondary | ICD-10-CM | POA: Diagnosis not present

## 2021-12-21 DIAGNOSIS — I152 Hypertension secondary to endocrine disorders: Secondary | ICD-10-CM

## 2021-12-21 DIAGNOSIS — I10 Essential (primary) hypertension: Secondary | ICD-10-CM

## 2021-12-21 DIAGNOSIS — Z89422 Acquired absence of other left toe(s): Secondary | ICD-10-CM | POA: Diagnosis not present

## 2021-12-21 DIAGNOSIS — E1159 Type 2 diabetes mellitus with other circulatory complications: Secondary | ICD-10-CM | POA: Diagnosis not present

## 2021-12-21 NOTE — Progress Notes (Signed)
Name: [name]   MRN: [id number]    DOB: 1965-12-14   Date:12/21/2021       Progress Note  Subjective  Chief Complaint  Follow Up  HPI  DMII: he was seen in Nov and since his visit he has been taking Glipizide, Actos , Jardiance and tolerating the new rx of Rybelsus. He states has been under 180 fasting now. He states usually 130-160's now. He is more compliant with statin therapy. He is trying to follow a diabetic diet . He has macro albuminuria and has seen nephrologist in Dec. He denies polyphagia, polydipsia or polyuria at this time.   HTN:he went to [facility] earlier January because his bp was elevated, Dr. Corky Downs resumed Diovan hctz 320/25, continues Bystolic 5 mg and bp has been controlled again. No chest pain or palpitation    Dyslipidemia: taking Crestor daily, Last LDL still not at goal but down from 162 to 79, we will recheck before adding another medication. No myalgia.   Ear fullness: he feels like he is under water, going on for a few weeks, he is concerned it may be cerumen impaction , no tinnitus    Patient Active Problem List   Diagnosis Date Noted   PAD (peripheral artery disease) ([place]) 04/12/2021   Anemia 04/12/2021   Proteinuria 10/14/2019   Secondary renal hyperparathyroidism ([place]) 03/19/2019   Diabetic ulcer of foot with fat layer exposed ([place]) 12/18/2018   Type 2 diabetes mellitus with chronic kidney disease and hypertension 12/18/2018   Dyslipidemia associated with type 2 diabetes mellitus ([place]) 07/03/2018   Vitamin D deficiency 04/11/2018   ED (erectile dysfunction) 04/10/2018   Lower urinary tract symptoms (LUTS) 02/09/2017   Hypertension 07/05/2015   Hyperglycemia due to type 2 diabetes mellitus ([place]) 07/05/2015   HLD (hyperlipidemia) 07/05/2015    Past Surgical History:  Procedure Laterality Date   AMPUTATION Right 04/16/2021   Procedure: AMPUTATION  RIGHT 5TH RAY, INCISION AND DRAINAGE;  Surgeon: Edrick Kins, DPM;  Location: [facility];  Service:  Podiatry;  Laterality: Right;   APPLICATION OF WOUND VAC Right 04/21/2021   Procedure: APPLICATION OF WOUND VAC;  Surgeon: Edrick Kins, DPM;  Location: [facility];  Service: Podiatry;  Laterality: Right;   BONE BIOPSY Left 06/27/2020   Procedure: BONE BIOPSY;  Surgeon: Caroline More, DPM;  Location: [facility];  Service: Podiatry;  Laterality: Left;   INCISION AND DRAINAGE Left 06/27/2020   Procedure: INCISION AND DRAINAGE;  Surgeon: Caroline More, DPM;  Location: [facility];  Service: Podiatry;  Laterality: Left;   IRRIGATION AND DEBRIDEMENT FOOT Right 04/21/2021   Procedure: IRRIGATION AND DEBRIDEMENT FOOT;  Surgeon: Edrick Kins, DPM;  Location: [facility];  Service: Podiatry;  Laterality: Right;   LOWER EXTREMITY ANGIOGRAPHY Left 06/30/2020   Procedure: Lower Extremity Angiography;  Surgeon: Algernon Huxley, MD;  Location: [facility];  Service: Cardiovascular;  Laterality: Left;   LOWER EXTREMITY ANGIOGRAPHY Right 04/14/2021   Procedure: Lower Extremity Angiography;  Surgeon: Algernon Huxley, MD;  Location: [facility];  Service: Cardiovascular;  Laterality: Right;   NO PAST SURGERIES     WOUND DEBRIDEMENT Right 04/19/2021   Procedure: DEBRIDEMENT WOUND;  Surgeon: Edrick Kins, DPM;  Location: [facility];  Service: Podiatry;  Laterality: Right;    Family History  Problem Relation Age of Onset   Heart disease Father    Heart attack Father    Healthy Mother    Prostate cancer Neg Hx  Kidney cancer Neg Hx    Bladder Cancer Neg Hx     Social History   Tobacco Use   Smoking status: Never   Smokeless tobacco: Never  Substance Use Topics   Alcohol use: Yes    Alcohol/week: 6.0 - 12.0 standard drinks    Types: 6 - 12 Cans of beer per week    Comment: Occasional     Current Outpatient Medications:    ACCU-CHEK GUIDE test strip, USE TO TEST BLOOD SUGAR UP TO FOUR TIMES DAILY AS DIRECTED, Disp: 300 strip, Rfl: 1   Accu-Chek Softclix Lancets lancets, USE TO TEST BLOOD SUGAR UP  TO FOUR TIMES DAILY AS DIRECTED, Disp: 300 each, Rfl: 1   aspirin EC 81 MG tablet, Take 81 mg by mouth daily., Disp: , Rfl:    Blood Glucose Calibration (CONTOUR NEXT CONTROL) Normal SOLN, 1 Bottle by In Vitro route every 30 (thirty) days., Disp: 1 each, Rfl: 3   blood glucose meter kit and supplies, 1 each by Other route in the morning, at noon, and at bedtime. Dispense based on patient and insurance preference. Use up to four times daily as directed. (FOR ICD-10 E10.9, E11.9)., Disp: 1 each, Rfl: 0   Cyanocobalamin (B-12) 1000 MCG SUBL, Place 1 tablet under the tongue daily., Disp: 100 tablet, Rfl: 1   empagliflozin (JARDIANCE) 25 MG TABS tablet, Take 1 tablet (25 mg total) by mouth daily., Disp: 90 tablet, Rfl: 0   glipiZIDE (GLUCOTROL XL) 5 MG 24 hr tablet, Take 1 tablet (5 mg total) by mouth daily with breakfast., Disp: 90 tablet, Rfl: 0   nebivolol (BYSTOLIC) 5 MG tablet, Take 1 tablet (5 mg total) by mouth daily., Disp: 30 tablet, Rfl: 2   pioglitazone (ACTOS) 15 MG tablet, Take 1 tablet (15 mg total) by mouth daily., Disp: 90 tablet, Rfl: 0   rosuvastatin (CRESTOR) 40 MG tablet, Take 1 tablet (40 mg total) by mouth daily., Disp: 90 tablet, Rfl: 0   Semaglutide (RYBELSUS) 7 MG TABS, Take 1 tablet by mouth daily., Disp: 90 tablet, Rfl: 0   torsemide (DEMADEX) 10 MG tablet, Take 10 mg by mouth daily., Disp: , Rfl:    valsartan (DIOVAN) 160 MG tablet, Take 1 tablet (160 mg total) by mouth daily., Disp: 90 tablet, Rfl: 0   valsartan-hydrochlorothiazide (DIOVAN HCT) 320-25 MG tablet, Take 1 tablet by mouth daily., Disp: 30 tablet, Rfl: 2   Vitamin D, Ergocalciferol, (DRISDOL) 1.25 MG (50000 UNIT) CAPS capsule, Take 1 capsule (50,000 Units total) by mouth every 7 (seven) days., Disp: 12 capsule, Rfl: 0  No Known Allergies  I personally reviewed active problem list, medication list, allergies, family history, social history, health maintenance with the patient/caregiver  today.   ROS  Constitutional: Negative for fever or weight change.  Respiratory: Negative for cough and shortness of breath.   Cardiovascular: Negative for chest pain or palpitations.  Gastrointestinal: Negative for abdominal pain, no bowel changes.  Musculoskeletal: Negative for gait problem or joint swelling.  Skin: Negative for rash.  Neurological: Negative for dizziness or headache.  No other specific complaints in a complete review of systems (except as listed in HPI above).   Objective  Vitals:   12/21/21 1436  BP: 136/72  Pulse: 85  Resp: 16  Temp: 98.2 F (36.8 C)  SpO2: 97%  Weight: 191 lb (86.6 kg)  Height: '5\' 11"'  (1.803 m)    Body mass index is 26.64 kg/m.  Physical Exam  Constitutional: Patient appears well-developed and  well-nourished.  No distress.  HEENT: head atraumatic, normocephalic, pupils equal and reactive to light, ears wax on both ear cannals, neck supple Cardiovascular: Normal rate, regular rhythm and normal heart sounds.  No murmur heard. No BLE edema. Pulmonary/Chest: Effort normal and breath sounds normal. No respiratory distress. Abdominal: Soft.  There is no tenderness. Psychiatric: Patient has a normal mood and affect. behavior is normal. Judgment and thought content normal.   Recent Results (from the past 2160 hour(s))  POCT HgB A1C     Status: Abnormal   Collection Time: 11/09/21  8:39 AM  Result Value Ref Range   Hemoglobin A1C 8.9 (A) 4.0 - 5.6 %   HbA1c POC (<> result, manual entry)     HbA1c, POC (prediabetic range)     HbA1c, POC (controlled diabetic range)    Lipid panel     Status: None   Collection Time: 11/09/21  9:02 AM  Result Value Ref Range   Cholesterol 168 <200 mg/dL   HDL 71 > OR = 40 mg/dL   Triglycerides 96 <150 mg/dL   LDL Cholesterol (Calc) 79 mg/dL (calc)    Comment: Reference range: <100 . Desirable range <100 mg/dL for primary prevention;   <70 mg/dL for patients with CHD or diabetic patients  with > or  = 2 CHD risk factors. Marland Kitchen LDL-C is now calculated using the Martin-Hopkins  calculation, which is a validated novel method providing  better accuracy than the Friedewald equation in the  estimation of LDL-C.  Cresenciano Genre et al. Annamaria Helling. 4315;400(86): 2061-2068  (http://education.[facility].com/faq/FAQ164)    Total CHOL/HDL Ratio 2.4 <5.0 (calc)   Non-HDL Cholesterol (Calc) 97 <130 mg/dL (calc)    Comment: For patients with diabetes plus 1 major ASCVD risk  factor, treating to a non-HDL-C goal of <100 mg/dL  (LDL-C of <70 mg/dL) is considered a therapeutic  option.   Microalbumin / creatinine urine ratio     Status: Abnormal   Collection Time: 11/09/21  9:02 AM  Result Value Ref Range   Creatinine, Urine 42 20 - 320 mg/dL   Microalb, Ur 355.4 mg/dL    Comment: Verified by repeat analysis. Marland Kitchen Reference Range Not established    Microalb Creat Ratio 8,462 (H) <30 mcg/mg creat    Comment: . The ADA defines abnormalities in albumin excretion as follows: Marland Kitchen Albuminuria Category        Result (mcg/mg creatinine) . Normal to Mildly increased   <30 Moderately increased         30-299  Severely increased           > OR = 300 . The ADA recommends that at least two of three specimens collected within a 3-6 month period be abnormal before considering a patient to be within a diagnostic category.   COMPLETE METABOLIC PANEL WITH GFR     Status: Abnormal   Collection Time: 11/09/21  9:02 AM  Result Value Ref Range   Glucose, Bld 282 (H) 65 - 99 mg/dL    Comment: .            Fasting reference interval . For someone without known diabetes, a glucose value >125 mg/dL indicates that they may have diabetes and this should be confirmed with a follow-up test. .    BUN 18 7 - 25 mg/dL   Creat 0.79 0.70 - 1.30 mg/dL   eGFR 106 > OR = 60 mL/min/1.63m    Comment: The eGFR is based on the CKD-EPI 2021 equation. To calculate  the new eGFR from a previous Creatinine or Cystatin C result,  go to https://www.kidney.org/professionals/ kdoqi/gfr%5Fcalculator    BUN/Creatinine Ratio NOT APPLICABLE 6 - 22 (calc)   Sodium 136 135 - 146 mmol/L   Potassium 4.4 3.5 - 5.3 mmol/L   Chloride 100 98 - 110 mmol/L   CO2 26 20 - 32 mmol/L   Calcium 9.2 8.6 - 10.3 mg/dL   Total Protein 6.1 6.1 - 8.1 g/dL   Albumin 3.2 (L) 3.6 - 5.1 g/dL   Globulin 2.9 1.9 - 3.7 g/dL (calc)   AG Ratio 1.1 1.0 - 2.5 (calc)   Total Bilirubin 0.9 0.2 - 1.2 mg/dL   Alkaline phosphatase (APISO) 70 35 - 144 U/L   AST 10 10 - 35 U/L   ALT 13 9 - 46 U/L  CBC with Differential/Platelet     Status: None   Collection Time: 11/09/21  9:02 AM  Result Value Ref Range   WBC 6.2 3.8 - 10.8 Thousand/uL   RBC 4.60 4.20 - 5.80 Million/uL   Hemoglobin 14.1 13.2 - 17.1 g/dL   HCT 42.7 38.5 - 50.0 %   MCV 92.8 80.0 - 100.0 fL   MCH 30.7 27.0 - 33.0 pg   MCHC 33.0 32.0 - 36.0 g/dL   RDW 11.7 11.0 - 15.0 %   Platelets 273 140 - 400 Thousand/uL   MPV 10.0 7.5 - 12.5 fL   Neutro Abs 4,780 1,500 - 7,800 cells/uL   Lymphs Abs 936 850 - 3,900 cells/uL   Absolute Monocytes 378 200 - 950 cells/uL   Eosinophils Absolute 68 15 - 500 cells/uL   Basophils Absolute 37 0 - 200 cells/uL   Neutrophils Relative % 77.1 %   Total Lymphocyte 15.1 %   Monocytes Relative 6.1 %   Eosinophils Relative 1.1 %   Basophils Relative 0.6 %  Iron, TIBC and Ferritin Panel     Status: Abnormal   Collection Time: 11/09/21  9:02 AM  Result Value Ref Range   Iron 42 (L) 50 - 180 mcg/dL   TIBC 314 250 - 425 mcg/dL (calc)   %SAT 13 (L) 20 - 48 % (calc)   Ferritin 68 38 - 380 ng/mL  Vitamin B12     Status: None   Collection Time: 11/09/21  9:02 AM  Result Value Ref Range   Vitamin B-12 404 200 - 1,100 pg/mL  Parathyroid hormone, intact (no Ca)     Status: None   Collection Time: 11/09/21  9:02 AM  Result Value Ref Range   PTH 72 16 - 77 pg/mL    Comment: . Interpretive Guide    Intact PTH           Calcium ------------------    ----------            ------- Normal Parathyroid    Normal               Normal Hypoparathyroidism    Low or Low Normal    Low Hyperparathyroidism    Primary            Normal or High       High    Secondary          High                 Normal or Low    Tertiary           High  High Non-Parathyroid    Hypercalcemia      Low or Low Normal    High .   VITAMIN D 25 Hydroxy (Vit-D Deficiency, Fractures)     Status: None   Collection Time: 11/09/21  9:02 AM  Result Value Ref Range   Vit D, 25-Hydroxy 50 30 - 100 ng/mL    Comment: Vitamin D Status         25-OH Vitamin D: . Deficiency:                    <20 ng/mL Insufficiency:             20 - 29 ng/mL Optimal:                 > or = 30 ng/mL . For 25-OH Vitamin D testing on patients on  D2-supplementation and patients for whom quantitation  of D2 and D3 fractions is required, the Quest[facility]ureD(TM) 25-OH VIT D, (D2,D3), LC/MS/MS is recommended: order  code [phone] (patients >71yr). See Note 1 . Note 1 . For additional information, please refer to  http://education.[facility].com/faq/FAQ199  (This link is being provided for informational/ educational purposes only.)     PHQ2/9: Depression [facility] 12/21/2021 11/09/2021 04/28/2021 02/09/2021 10/11/2020  Decreased Interest 0 0 0 0 0  Down, Depressed, Hopeless 0 0 0 0 0  PHQ - 2 Score 0 0 0 0 0  Altered sleeping 0 0 - - -  Tired, decreased energy 0 0 - - -  Change in appetite 0 0 - - -  Feeling bad or failure about yourself  0 0 - - -  Trouble concentrating 0 0 - - -  Moving slowly or fidgety/restless 0 0 - - -  Suicidal thoughts 0 0 - - -  PHQ-9 Score 0 0 - - -  Difficult doing work/chores - - - - -  Some recent data might be hidden    phq 9 is negative   Fall Risk: Fall Risk  12/21/2021 11/09/2021 04/28/2021 02/09/2021 10/11/2020  Falls in the past year? 0 0 0 0 0  Number falls in past yr: 0 0 0 0 0  Injury with Fall? 0 0 0 0 0  Risk for fall due to : No Fall  Risks No Fall Risks - - -  Follow up Falls prevention discussed Falls prevention discussed - - -      Functional Status Survey: Is the patient deaf or have difficulty hearing?: No Does the patient have difficulty seeing, even when wearing glasses/contacts?: No Does the patient have difficulty concentrating, remembering, or making decisions?: No Does the patient have difficulty walking or climbing stairs?: No Does the patient have difficulty dressing or bathing?: No Does the patient have difficulty doing errands alone such as visiting a doctor's office or shopping?: No    Assessment & Plan  1. Type 2 diabetes with complication (HCC)  Return in 2 months   2. History of amputation of lesser toe, left ([place]  He states feeling better   3. Essential hypertension   4. Hypertension associated with diabetes ([place]   5. Bilateral impacted cerumen  - Ear Lavage - explained it may be eustachian tube dysfunction and if no improvement call back for rx of flonase  Verbal consent given Possible side effects discussed with patient Ears were  lavaged with warm water and peroxide  Patient tolerated procedure well No complications

## 2021-12-24 NOTE — Progress Notes (Signed)
Subjective:  Patient presents today for follow-up evaluation of an ulcer to the plantar aspect of the right foot.  Patient has shown significant improvement over the past several months.  We have applied Apligraf here in the office with healing of the majority of the ulcer.  The Apligraf applications have been exhausted.  He presents for follow-up treatment and evaluation  Past Medical History:  Diagnosis Date   Diabetes mellitus without complication ([place])    Hyperlipidemia    Hypertension    Past Surgical History:  Procedure Laterality Date   AMPUTATION Right 04/16/2021   Procedure: AMPUTATION  RIGHT 5TH RAY, INCISION AND DRAINAGE;  Surgeon: Edrick Kins, DPM;  Location: [facility];  Service: Podiatry;  Laterality: Right;   APPLICATION OF WOUND VAC Right 04/21/2021   Procedure: APPLICATION OF WOUND VAC;  Surgeon: Edrick Kins, DPM;  Location: [facility];  Service: Podiatry;  Laterality: Right;   BONE BIOPSY Left 06/27/2020   Procedure: BONE BIOPSY;  Surgeon: Caroline More, DPM;  Location: [facility];  Service: Podiatry;  Laterality: Left;   INCISION AND DRAINAGE Left 06/27/2020   Procedure: INCISION AND DRAINAGE;  Surgeon: Caroline More, DPM;  Location: [facility];  Service: Podiatry;  Laterality: Left;   IRRIGATION AND DEBRIDEMENT FOOT Right 04/21/2021   Procedure: IRRIGATION AND DEBRIDEMENT FOOT;  Surgeon: Edrick Kins, DPM;  Location: [facility];  Service: Podiatry;  Laterality: Right;   LOWER EXTREMITY ANGIOGRAPHY Left 06/30/2020   Procedure: Lower Extremity Angiography;  Surgeon: Algernon Huxley, MD;  Location: [facility];  Service: Cardiovascular;  Laterality: Left;   LOWER EXTREMITY ANGIOGRAPHY Right 04/14/2021   Procedure: Lower Extremity Angiography;  Surgeon: Algernon Huxley, MD;  Location: [facility];  Service: Cardiovascular;  Laterality: Right;   NO PAST SURGERIES     WOUND DEBRIDEMENT Right 04/19/2021   Procedure: DEBRIDEMENT WOUND;  Surgeon: Edrick Kins, DPM;   Location: [facility];  Service: Podiatry;  Laterality: Right;   No Known Allergies  Objective/Physical Exam Wound #1: Along the plantar aspect of the right forefoot has demonstrated significant improvement and reepithelialization of skin.  Graft incorporation has demonstrated great reduction of the open wound.    There is a portion of full-thickness wound to the plantar lateral aspect of the midfoot with serosanguineous drainage.  Granular wound base noted.  Periwound is macerated and callused.  No significant malodor noted.  There is no exposed bone muscle tendon ligament or joint.  The wound measures approximately 3.5 x 2.5 x 0.3 cm  Wound also noted to the first interdigital webspace measuring approximately 0.3 x 0.1 x 0.2 cm in depth.  The wound is very stable and continues to demonstrate slow steady improvement  Radiographic exam RT foot 11/08/2021: Absence of the fifth ray noted.  Adductovarus deformity noted.  Diffuse degenerative changes noted throughout the TMT and MTP joints.  There is concern for chronic osteomyelitis however there is no significant degenerative changes since x-rays were taken 08/09/2021.  There actually appears to be some osseous stabilization and restructuring along the fourth metatarsal.  Assessment: 1.  Diabetic foot ulcers x2 right foot 2.  Diabetes mellitus type 2; uncontrolled; with peripheral polyneuropathy   Plan of Care:  1. Patient was evaluated.  Medically necessary excisional debridement including subcutaneous tissue was performed using a tissue nipper.  Excisional debridement of all necrotic nonviable tissue down to healthier bleeding viable tissue was performed with postdebridement measurement same as pre- 2.  Puracol collagen wound  dressing was applied and provided for the patient to apply daily 3.  Continue cam boot.  Minimal weightbearing as tolerated 4.  Return to clinic in 2 weeks  Edrick Kins, DPM [facility]  Dr. Edrick Kins, DPM    [address], [place] [address]                Office [phone]  Fax [phone]

## 2021-12-27 ENCOUNTER — Encounter: Payer: Self-pay | Admitting: Podiatry

## 2021-12-27 ENCOUNTER — Ambulatory Visit (INDEPENDENT_AMBULATORY_CARE_PROVIDER_SITE_OTHER): Payer: Managed Care, Other (non HMO) | Admitting: Podiatry

## 2021-12-27 ENCOUNTER — Other Ambulatory Visit: Payer: Self-pay

## 2021-12-27 DIAGNOSIS — E0843 Diabetes mellitus due to underlying condition with diabetic autonomic (poly)neuropathy: Secondary | ICD-10-CM | POA: Diagnosis not present

## 2021-12-27 DIAGNOSIS — L97512 Non-pressure chronic ulcer of other part of right foot with fat layer exposed: Secondary | ICD-10-CM

## 2022-01-02 DIAGNOSIS — M79676 Pain in unspecified toe(s): Secondary | ICD-10-CM

## 2022-01-05 NOTE — Progress Notes (Signed)
Subjective:  Patient presents today for follow-up evaluation of an ulcer to the plantar aspect of the right foot.  Patient states that he is doing well.  The wound is stable.  He has been applying the collagen as instructed.  No new complaints at this time Past Medical History:  Diagnosis Date   Diabetes mellitus without complication ([place])    Hyperlipidemia    Hypertension    Past Surgical History:  Procedure Laterality Date   AMPUTATION Right 04/16/2021   Procedure: AMPUTATION  RIGHT 5TH RAY, INCISION AND DRAINAGE;  Surgeon: Edrick Kins, DPM;  Location: [facility];  Service: Podiatry;  Laterality: Right;   APPLICATION OF WOUND VAC Right 04/21/2021   Procedure: APPLICATION OF WOUND VAC;  Surgeon: Edrick Kins, DPM;  Location: [facility];  Service: Podiatry;  Laterality: Right;   BONE BIOPSY Left 06/27/2020   Procedure: BONE BIOPSY;  Surgeon: Caroline More, DPM;  Location: [facility];  Service: Podiatry;  Laterality: Left;   INCISION AND DRAINAGE Left 06/27/2020   Procedure: INCISION AND DRAINAGE;  Surgeon: Caroline More, DPM;  Location: [facility];  Service: Podiatry;  Laterality: Left;   IRRIGATION AND DEBRIDEMENT FOOT Right 04/21/2021   Procedure: IRRIGATION AND DEBRIDEMENT FOOT;  Surgeon: Edrick Kins, DPM;  Location: [facility];  Service: Podiatry;  Laterality: Right;   LOWER EXTREMITY ANGIOGRAPHY Left 06/30/2020   Procedure: Lower Extremity Angiography;  Surgeon: Algernon Huxley, MD;  Location: [facility];  Service: Cardiovascular;  Laterality: Left;   LOWER EXTREMITY ANGIOGRAPHY Right 04/14/2021   Procedure: Lower Extremity Angiography;  Surgeon: Algernon Huxley, MD;  Location: [facility];  Service: Cardiovascular;  Laterality: Right;   NO PAST SURGERIES     WOUND DEBRIDEMENT Right 04/19/2021   Procedure: DEBRIDEMENT WOUND;  Surgeon: Edrick Kins, DPM;  Location: [facility];  Service: Podiatry;  Laterality: Right;   No Known Allergies  Objective/Physical Exam Wound #1: Along  the plantar aspect of the right forefoot has demonstrated significant improvement and reepithelialization of skin.  Graft incorporation has demonstrated great reduction of the open wound.    There continues to be a portion of full-thickness wound to the plantar lateral aspect of the midfoot with serosanguineous drainage.  Granular wound base noted.  Periwound is macerated and callused.  No significant malodor noted.  There is no exposed bone muscle tendon ligament or joint.  The wound measures approximately 3.5 x 2.5 x 0.3 cm  Wound also noted to the first interdigital webspace measuring approximately 0.2 x 0.1 x 0.2 cm in depth.  The wound is very stable and continues to demonstrate slow steady improvement  Radiographic exam RT foot 11/08/2021: Absence of the fifth ray noted.  Adductovarus deformity noted.  Diffuse degenerative changes noted throughout the TMT and MTP joints.  There is concern for chronic osteomyelitis however there is no significant degenerative changes since x-rays were taken 08/09/2021.  There actually appears to be some osseous stabilization and restructuring along the fourth metatarsal.  Assessment: 1.  Diabetic foot ulcers x2 right foot 2.  Diabetes mellitus type 2; uncontrolled; with peripheral polyneuropathy   Plan of Care:  1. Patient was evaluated.  Medically necessary excisional debridement including subcutaneous tissue was performed using a tissue nipper.  Excisional debridement of all necrotic nonviable tissue down to healthier bleeding viable tissue was performed with postdebridement measurement same as pre- 2.  Puracol collagen wound dressing was applied and provided for the patient to apply daily.  Continue  collagen dressings daily 3.  Continue cam boot.  Minimal weightbearing as tolerated 4.  Return to clinic in 2 weeks  Edrick Kins, DPM [facility]  Dr. Edrick Kins, DPM    [address], [place] [address]                Office [phone]  Fax [phone]

## 2022-01-10 ENCOUNTER — Other Ambulatory Visit: Payer: Self-pay

## 2022-01-10 ENCOUNTER — Ambulatory Visit (INDEPENDENT_AMBULATORY_CARE_PROVIDER_SITE_OTHER): Payer: Managed Care, Other (non HMO) | Admitting: Podiatry

## 2022-01-10 ENCOUNTER — Encounter: Payer: Self-pay | Admitting: Podiatry

## 2022-01-10 DIAGNOSIS — L97512 Non-pressure chronic ulcer of other part of right foot with fat layer exposed: Secondary | ICD-10-CM | POA: Diagnosis not present

## 2022-01-10 DIAGNOSIS — E0843 Diabetes mellitus due to underlying condition with diabetic autonomic (poly)neuropathy: Secondary | ICD-10-CM

## 2022-01-10 NOTE — Progress Notes (Signed)
Subjective:  Patient presents today for follow-up evaluation of an ulcer to the plantar aspect of the right foot.  Patient states that he is doing well.  The wound is stable.  He has been applying the collagen as instructed.  No new complaints at this time Past Medical History:  Diagnosis Date   Diabetes mellitus without complication ([place])    Hyperlipidemia    Hypertension    Past Surgical History:  Procedure Laterality Date   AMPUTATION Right 04/16/2021   Procedure: AMPUTATION  RIGHT 5TH RAY, INCISION AND DRAINAGE;  Surgeon: Edrick Kins, DPM;  Location: [facility];  Service: Podiatry;  Laterality: Right;   APPLICATION OF WOUND VAC Right 04/21/2021   Procedure: APPLICATION OF WOUND VAC;  Surgeon: Edrick Kins, DPM;  Location: [facility];  Service: Podiatry;  Laterality: Right;   BONE BIOPSY Left 06/27/2020   Procedure: BONE BIOPSY;  Surgeon: Caroline More, DPM;  Location: [facility];  Service: Podiatry;  Laterality: Left;   INCISION AND DRAINAGE Left 06/27/2020   Procedure: INCISION AND DRAINAGE;  Surgeon: Caroline More, DPM;  Location: [facility];  Service: Podiatry;  Laterality: Left;   IRRIGATION AND DEBRIDEMENT FOOT Right 04/21/2021   Procedure: IRRIGATION AND DEBRIDEMENT FOOT;  Surgeon: Edrick Kins, DPM;  Location: [facility];  Service: Podiatry;  Laterality: Right;   LOWER EXTREMITY ANGIOGRAPHY Left 06/30/2020   Procedure: Lower Extremity Angiography;  Surgeon: Algernon Huxley, MD;  Location: [place] [facility];  Service: Cardiovascular;  Laterality: Left;   LOWER EXTREMITY ANGIOGRAPHY Right 04/14/2021   Procedure: Lower Extremity Angiography;  Surgeon: Algernon Huxley, MD;  Location: [place] [facility];  Service: Cardiovascular;  Laterality: Right;   NO PAST SURGERIES     WOUND DEBRIDEMENT Right 04/19/2021   Procedure: DEBRIDEMENT WOUND;  Surgeon: Edrick Kins, DPM;  Location: [facility];  Service: Podiatry;  Laterality: Right;   No Known Allergies  Objective/Physical Exam Wound #1: Along  the plantar aspect of the right forefoot has demonstrated significant improvement and reepithelialization of skin.  Graft incorporation has demonstrated great reduction of the open wound.    There continues to be a portion of full-thickness wound to the plantar lateral aspect of the midfoot with serosanguineous drainage.  Granular wound base noted.  Periwound is macerated and callused.  No significant malodor noted.  There is no exposed bone muscle tendon ligament or joint.  The wound measures approximately 3.5 x 2.5 x 0.3 cm  Wound also noted to the first interdigital webspace measuring approximately 0.2 x 0.1 x 0.2 cm in depth.  The wound is very stable and continues to demonstrate slow steady improvement  Radiographic exam RT foot 11/08/2021: Absence of the fifth ray noted.  Adductovarus deformity noted.  Diffuse degenerative changes noted throughout the TMT and MTP joints.  There is concern for chronic osteomyelitis however there is no significant degenerative changes since x-rays were taken 08/09/2021.  There actually appears to be some osseous stabilization and restructuring along the fourth metatarsal.  Assessment: 1.  Diabetic foot ulcers x2 right foot 2.  Diabetes mellitus type 2; uncontrolled; with peripheral polyneuropathy   Plan of Care:  1. Patient was evaluated.  Medically necessary excisional debridement including subcutaneous tissue was performed using a tissue nipper.  Excisional debridement of all necrotic nonviable tissue down to healthier bleeding viable tissue was performed with postdebridement measurement same as pre- 2.  Puracol collagen wound dressing was applied and provided for the patient to apply daily.  Continue  collagen dressings daily 3.  Continue cam boot.  Minimal weightbearing as tolerated 4.  Order placed for dressing supplies to  [facility]  5.  Also we are going to see if we can get the patient approved a Walking [employer] for  offloading of wounds.  6. return to clinic in 2 weeks  Edrick Kins, DPM [facility]  Dr. Edrick Kins, DPM    [address], [place] [address]                Office [phone]  Fax [phone]

## 2022-01-11 NOTE — Progress Notes (Signed)
01/12/2022 4:07 PM   Illene Dillon 08-09-1966 [id number]  Referring provider: [name], MD [address] [place],  [place] [address]  Chief Complaint  Patient presents with   Benign Prostatic Hypertrophy   Urological history: 1. BPH with LU TS -PSA pending -I PSS 8/4  2. ED -contributing factors of age, BPH, DM, HTN, HLD, alcohol abuse and blood pressure medications -SHIM 4   HPI: [name] is a 56 y.o. male who presents today for prostate check and ED.  He has a weak urinary stream and hesitancy intermittently.  Patient denies any modifying or aggravating factors.  Patient denies any gross hematuria, dysuria or suprapubic/flank pain.  Patient denies any fevers, chills, nausea or vomiting.     IPSS     Row Name 01/12/22 1500         International Prostate Symptom Score   How often have you had the sensation of not emptying your bladder? Not at All     How often have you had to urinate less than every two hours? Not at All     How often have you found you stopped and started again several times when you urinated? About half the time     How often have you found it difficult to postpone urination? Not at All     How often have you had a weak urinary stream? About half the time     How often have you had to strain to start urination? Not at All     How many times did you typically get up at night to urinate? 2 Times     Total IPSS Score 8       Quality of Life due to urinary symptoms   If you were to spend the rest of your life with your urinary condition just the way it is now how would you feel about that? Mostly Disatisfied              Score:  1-7 Mild 8-19 Moderate 20-35 Severe   Patient still having spontaneous erections.   He denies any pain or curvature with erections.  He has not had an erection in one year.  He has tried sildenafil and tadalafil in the past and states he reinfected.  He is wondering about Levitra.   SHIM      Row Name 01/12/22 1513         SHIM: Over the last 6 months:   How do you rate your confidence that you could get and keep an erection? Low     When you had erections with sexual stimulation, how often were your erections hard enough for penetration (entering your partner)? Almost Never or Never     During sexual intercourse, how often were you able to maintain your erection after you had penetrated (entered) your partner? Almost Never or Never       SHIM Total Score   SHIM 4              Score: 1-7 Severe ED 8-11 Moderate ED 12-16 Mild-Moderate ED 17-21 Mild ED 22-25 No ED     PMH: Past Medical History:  Diagnosis Date   Diabetes mellitus without complication ([place])    Hyperlipidemia    Hypertension     Surgical History: Past Surgical History:  Procedure Laterality Date   AMPUTATION Right 04/16/2021   Procedure: AMPUTATION  RIGHT 5TH RAY, INCISION AND DRAINAGE;  Surgeon: Edrick Kins, DPM;  Location: [facility];  Service: Podiatry;  Laterality: Right;   APPLICATION OF WOUND VAC Right 04/21/2021   Procedure: APPLICATION OF WOUND VAC;  Surgeon: Edrick Kins, DPM;  Location: [facility];  Service: Podiatry;  Laterality: Right;   BONE BIOPSY Left 06/27/2020   Procedure: BONE BIOPSY;  Surgeon: Caroline More, DPM;  Location: [facility];  Service: Podiatry;  Laterality: Left;   INCISION AND DRAINAGE Left 06/27/2020   Procedure: INCISION AND DRAINAGE;  Surgeon: Caroline More, DPM;  Location: [facility];  Service: Podiatry;  Laterality: Left;   IRRIGATION AND DEBRIDEMENT FOOT Right 04/21/2021   Procedure: IRRIGATION AND DEBRIDEMENT FOOT;  Surgeon: Edrick Kins, DPM;  Location: [facility];  Service: Podiatry;  Laterality: Right;   LOWER EXTREMITY ANGIOGRAPHY Left 06/30/2020   Procedure: Lower Extremity Angiography;  Surgeon: Algernon Huxley, MD;  Location: [place] [facility];  Service: Cardiovascular;  Laterality: Left;   LOWER EXTREMITY ANGIOGRAPHY Right 04/14/2021   Procedure: Lower  Extremity Angiography;  Surgeon: Algernon Huxley, MD;  Location: [place] [facility];  Service: Cardiovascular;  Laterality: Right;   NO PAST SURGERIES     WOUND DEBRIDEMENT Right 04/19/2021   Procedure: DEBRIDEMENT WOUND;  Surgeon: Edrick Kins, DPM;  Location: [facility];  Service: Podiatry;  Laterality: Right;    Home Medications:  Allergies as of 01/12/2022   No Known Allergies      Medication List        Accurate as of January 12, 2022  4:07 PM. If you have any questions, ask your nurse or doctor.          Accu-Chek Guide test strip Generic drug: glucose blood USE TO TEST BLOOD SUGAR UP TO FOUR TIMES DAILY AS DIRECTED   Accu-Chek Softclix Lancets lancets USE TO TEST BLOOD SUGAR UP TO FOUR TIMES DAILY AS DIRECTED   aspirin EC 81 MG tablet Take 81 mg by mouth daily.   B-12 1000 MCG Subl Place 1 tablet under the tongue daily.   blood glucose meter kit and supplies 1 each by Other route in the morning, at noon, and at bedtime. Dispense based on patient and insurance preference. Use up to four times daily as directed. (FOR ICD-10 E10.9, E11.9).   Contour Next Control Normal Soln 1 Bottle by In Vitro route every 30 (thirty) days.   empagliflozin 25 MG Tabs tablet Commonly known as: Jardiance Take 1 tablet (25 mg total) by mouth daily.   glipiZIDE 5 MG 24 hr tablet Commonly known as: GLUCOTROL XL Take 1 tablet (5 mg total) by mouth daily with breakfast.   nebivolol 5 MG tablet Commonly known as: Bystolic Take 1 tablet (5 mg total) by mouth daily.   pioglitazone 15 MG tablet Commonly known as: Actos Take 1 tablet (15 mg total) by mouth daily.   rosuvastatin 40 MG tablet Commonly known as: CRESTOR Take 1 tablet (40 mg total) by mouth daily.   Rybelsus 7 MG Tabs Generic drug: Semaglutide Take 1 tablet by mouth daily.   sildenafil 100 MG tablet Commonly known as: VIAGRA Take 1 tablet (100 mg total) by mouth daily as needed for erectile dysfunction. Take two  hours prior to intercourse on an empty stomach Started by: Zara Council, PA-C   torsemide 10 MG tablet Commonly known as: DEMADEX Take 10 mg by mouth daily.   valsartan-hydrochlorothiazide 320-25 MG tablet Commonly known as: Diovan HCT Take 1 tablet by mouth daily.   Vitamin D (Ergocalciferol) 1.25 MG (50000 UNIT) Caps capsule  Commonly known as: DRISDOL Take 1 capsule (50,000 Units total) by mouth every 7 (seven) days.        Allergies: No Known Allergies  Family History: Family History  Problem Relation Age of Onset   Heart disease Father    Heart attack Father    Healthy Mother    Prostate cancer Neg Hx    Kidney cancer Neg Hx    Bladder Cancer Neg Hx     Social History:  reports that he has never smoked. He has never used smokeless tobacco. He reports current alcohol use of about 6.0 - 12.0 standard drinks per week. He reports that he does not use drugs.  ROS: Pertinent ROS in HPI  Physical Exam: BP (!) 215/129    Pulse 83    Ht '5\' 11"'  (1.803 m)    Wt 191 lb (86.6 kg)    BMI 26.64 kg/m   Constitutional:  Well nourished. Alert and oriented, No acute distress. HEENT: [place] AT, mask in place.  Trachea midline Cardiovascular: No clubbing, cyanosis, or edema. Respiratory: Normal respiratory effort, no increased work of breathing. GU: No CVA tenderness.  No bladder fullness or masses.  Patient with circumcised phallus.  Urethral meatus is patent.  No penile discharge. No penile lesions or rashes. Scrotum without lesions, cysts, rashes and/or edema.  Testicles are located scrotally bilaterally. No masses are appreciated in the testicles. Left and right epididymis are normal. Rectal: Patient with  normal sphincter tone. Anus and perineum without scarring or rashes. No rectal masses are appreciated. Prostate is approximately 50 grams, no nodules are appreciated. Seminal vesicles could not be palpated Neurologic: Grossly intact, no focal deficits, moving all 4  extremities. Psychiatric: Normal mood and affect.  Laboratory Data: Lab Results  Component Value Date   WBC 6.2 11/09/2021   HGB 14.1 11/09/2021   HCT 42.7 11/09/2021   MCV 92.8 11/09/2021   PLT 273 11/09/2021    Lab Results  Component Value Date   CREATININE 0.79 11/09/2021     Lab Results  Component Value Date   HGBA1C 8.9 (A) 11/09/2021       Component Value Date/Time   CHOL 168 11/09/2021 0902   CHOL 247 (H) 06/19/2016 1655   HDL 71 11/09/2021 0902   HDL 60 06/19/2016 1655   CHOLHDL 2.4 11/09/2021 0902   LDLCALC 79 11/09/2021 0902    Lab Results  Component Value Date   AST 10 11/09/2021   Lab Results  Component Value Date   ALT 13 11/09/2021    Urinalysis    Component Value Date/Time   COLORURINE YELLOW (A) 04/12/2021 2339   APPEARANCEUR CLEAR (A) 04/12/2021 2339   LABSPEC 1.023 04/12/2021 2339   PHURINE 5.0 04/12/2021 2339   GLUCOSEU >=500 (A) 04/12/2021 2339   HGBUR SMALL (A) 04/12/2021 2339   BILIRUBINUR NEGATIVE 04/12/2021 2339   KETONESUR 20 (A) 04/12/2021 2339   PROTEINUR 30 (A) 04/12/2021 2339   NITRITE NEGATIVE 04/12/2021 2339   LEUKOCYTESUR NEGATIVE 04/12/2021 2339  I have reviewed the labs.   Pertinent Imaging: N/A  Assessment & Plan:    1. BPH with LUTS -PSA pending -DRE benign -symptoms -intermittent weak urinary stream and hesitancy -continue conservative management, avoiding bladder irritants and timed voiding's  2. Erectile dysfunction - I explained to the patient that in order to achieve an erection it takes good functioning of the nervous system (parasympathetic and rs, sympathetic, sensory and motor), good blood flow into the erectile tissue of the penis and  a desire to have sex - I explained that conditions like diabetes, hypertension, coronary artery disease, peripheral vascular disease, smoking, alcohol consumption, age, sleep apnea and BPH can diminish the ability to have an erection - we will obtain a serum  testosterone level at this time; if it is abnormal we will need to repeat the study for confirmation  - A recent study published in Sex Med 2018 Apr 13 revealed moderate to vigorous aerobic exercise for 40 minutes 4 times per week can decrease erectile problems caused by physical inactivity, obesity, hypertension, metabolic syndrome and/or cardiovascular diseases  - We discussed trying a different PDE5 inhibitor, intra-urethral suppositories, intracavernous vasoactive drug injection therapy, vacuum constriction device and penile prosthesis implantation   -Explained that he had been on PDE 5 inhibitors in the past without effectiveness -He is not interested in injections, so would like to retrial Viagra sent a prescription for sildenafil to [facility] -Sildenafil 100 mg, 1 tablets two hours prior to intercourse on an empty stomach, # 23; he is warned not to take medications that contain nitrates.  I also advised him of the side effects, such as: headache, flushing, dyspepsia, abnormal vision, nasal congestion, back pain, myalgia, nausea, dizziness, and rash.    Return for Pending PSA testosterone results.  These notes generated with voice recognition software. I apologize for typographical errors.  Zara Council, PA-C  [facility] [address]  [place], [place] [address] [phone]

## 2022-01-12 ENCOUNTER — Other Ambulatory Visit: Payer: Self-pay

## 2022-01-12 ENCOUNTER — Ambulatory Visit: Payer: Managed Care, Other (non HMO) | Admitting: Urology

## 2022-01-12 ENCOUNTER — Encounter: Payer: Self-pay | Admitting: Urology

## 2022-01-12 VITALS — BP 192/108 | HR 83 | Ht 71.0 in | Wt 191.0 lb

## 2022-01-12 DIAGNOSIS — N529 Male erectile dysfunction, unspecified: Secondary | ICD-10-CM

## 2022-01-12 DIAGNOSIS — N401 Enlarged prostate with lower urinary tract symptoms: Secondary | ICD-10-CM

## 2022-01-12 DIAGNOSIS — N138 Other obstructive and reflux uropathy: Secondary | ICD-10-CM | POA: Diagnosis not present

## 2022-01-12 MED ORDER — SILDENAFIL CITRATE 100 MG PO TABS
100.0000 mg | ORAL_TABLET | Freq: Every day | ORAL | 3 refills | Status: DC | PRN
Start: 2022-01-12 — End: 2022-06-05

## 2022-01-13 ENCOUNTER — Other Ambulatory Visit: Payer: Self-pay | Admitting: *Deleted

## 2022-01-13 ENCOUNTER — Other Ambulatory Visit: Payer: Self-pay | Admitting: Family Medicine

## 2022-01-13 DIAGNOSIS — N529 Male erectile dysfunction, unspecified: Secondary | ICD-10-CM

## 2022-01-13 DIAGNOSIS — N138 Other obstructive and reflux uropathy: Secondary | ICD-10-CM

## 2022-01-13 LAB — PSA: Prostate Specific Ag, Serum: 0.6 ng/mL (ref 0.0–4.0)

## 2022-01-13 LAB — TESTOSTERONE: Testosterone: 210 ng/dL — ABNORMAL LOW (ref 264–916)

## 2022-01-13 MED ORDER — NEBIVOLOL HCL 5 MG PO TABS: 5.0000 mg | ORAL_TABLET | Freq: Every day | ORAL | 1 refills | Status: DC

## 2022-01-13 NOTE — Telephone Encounter (Signed)
Copied from [place] [id number]. Topic: General - Other >> Jan 13, 2022  8:14 AM Leward Quan A wrote: Reason for CRM: Patient called in to inquire of Dr Ancil Boozer nurse about getting an Rx for nebivolol (BYSTOLIC) 5 MG tablet sent to [facility] [place]     Phone:  [phone]  Fax:  [phone] since it is cheaper at this pharmacy. Please advise and call patient at Ph# [phone]

## 2022-01-13 NOTE — Telephone Encounter (Signed)
Requested medication (s) are due for refill today: Yes  Requested medication (s) are on the active medication list:Yes  Last refill:  12/15/21  Future visit scheduled: Yes  Notes to clinic:  Unable to refill per protocol, last refill by provider in the ED. Patient is requesting refill today, if possible, cheaper at [facility]     Requested Prescriptions  Pending Prescriptions Disp Refills   nebivolol (BYSTOLIC) 5 MG tablet 30 tablet 2    Sig: Take 1 tablet (5 mg total) by mouth daily.     Cardiovascular: Beta Blockers 3 Failed - 01/13/2022  4:15 PM      Failed - Last BP in normal range    BP Readings from Last 1 Encounters:  01/12/22 (!) 192/108          Passed - Cr in normal range and within 360 days    Creat  Date Value Ref Range Status  11/09/2021 0.79 0.70 - 1.30 mg/dL Final   Creatinine, Urine  Date Value Ref Range Status  11/09/2021 42 20 - 320 mg/dL Final          Passed - AST in normal range and within 360 days    AST  Date Value Ref Range Status  11/09/2021 10 10 - 35 U/L Final          Passed - ALT in normal range and within 360 days    ALT  Date Value Ref Range Status  11/09/2021 13 9 - 46 U/L Final          Passed - Last Heart Rate in normal range    Pulse Readings from Last 1 Encounters:  01/12/22 83          Passed - Valid encounter within last 6 months    Recent Outpatient Visits           3 weeks ago Type 2 diabetes with complication [facility])   [facility] [name], MD   2 months ago Type 2 diabetes with complication [facility])   [facility] [name], MD   8 months ago Vitamin D deficiency   [facility] [place], Drue Stager, MD   11 months ago Type 2 diabetes with complication [facility])   [facility] [name], MD   1 year ago Essential hypertension   [facility] [name], MD       Future Appointments              In 1 month Ancil Boozer, Drue Stager, MD [facility], [facility]

## 2022-01-13 NOTE — Telephone Encounter (Signed)
Patient checking on the status of message below and would like to know if Rx will be sent in today to   [facility] [place] Phone:  [phone]  Fax:  [phone]

## 2022-01-17 ENCOUNTER — Other Ambulatory Visit: Payer: Self-pay

## 2022-01-17 ENCOUNTER — Other Ambulatory Visit: Payer: Managed Care, Other (non HMO)

## 2022-01-17 DIAGNOSIS — N529 Male erectile dysfunction, unspecified: Secondary | ICD-10-CM

## 2022-01-17 DIAGNOSIS — N138 Other obstructive and reflux uropathy: Secondary | ICD-10-CM

## 2022-01-17 DIAGNOSIS — N401 Enlarged prostate with lower urinary tract symptoms: Secondary | ICD-10-CM

## 2022-01-18 LAB — TESTOSTERONE: Testosterone: 151 ng/dL — ABNORMAL LOW (ref 264–916)

## 2022-01-23 ENCOUNTER — Other Ambulatory Visit: Payer: Self-pay | Admitting: Family Medicine

## 2022-01-23 DIAGNOSIS — E559 Vitamin D deficiency, unspecified: Secondary | ICD-10-CM

## 2022-01-24 ENCOUNTER — Ambulatory Visit: Payer: Managed Care, Other (non HMO) | Admitting: Podiatry

## 2022-01-24 ENCOUNTER — Other Ambulatory Visit: Payer: Self-pay

## 2022-01-24 ENCOUNTER — Encounter: Payer: Self-pay | Admitting: Podiatry

## 2022-01-24 ENCOUNTER — Telehealth: Payer: Self-pay | Admitting: Urology

## 2022-01-24 ENCOUNTER — Other Ambulatory Visit: Payer: Self-pay | Admitting: *Deleted

## 2022-01-24 DIAGNOSIS — L97512 Non-pressure chronic ulcer of other part of right foot with fat layer exposed: Secondary | ICD-10-CM

## 2022-01-24 DIAGNOSIS — E0843 Diabetes mellitus due to underlying condition with diabetic autonomic (poly)neuropathy: Secondary | ICD-10-CM

## 2022-01-24 DIAGNOSIS — R7989 Other specified abnormal findings of blood chemistry: Secondary | ICD-10-CM

## 2022-01-24 DIAGNOSIS — N138 Other obstructive and reflux uropathy: Secondary | ICD-10-CM

## 2022-01-24 NOTE — Telephone Encounter (Signed)
Pt called office stating he has had his testosterone checked twice and wants to know what his next steps are.

## 2022-01-24 NOTE — Telephone Encounter (Signed)
See result note.  

## 2022-01-27 ENCOUNTER — Other Ambulatory Visit: Payer: Self-pay

## 2022-01-27 ENCOUNTER — Other Ambulatory Visit: Payer: Managed Care, Other (non HMO)

## 2022-01-27 DIAGNOSIS — R7989 Other specified abnormal findings of blood chemistry: Secondary | ICD-10-CM

## 2022-01-28 LAB — TESTOSTERONE: Testosterone: 234 ng/dL — ABNORMAL LOW (ref 264–916)

## 2022-02-01 NOTE — Progress Notes (Signed)
Subjective:  Patient presents today for follow-up evaluation of an ulcer to the plantar aspect of the right foot.  Patient states that he is doing well.  The wound is stable.  He has been applying the collagen as instructed.  No new complaints at this time  Past Medical History:  Diagnosis Date   Diabetes mellitus without complication ([place])    Hyperlipidemia    Hypertension    Past Surgical History:  Procedure Laterality Date   AMPUTATION Right 04/16/2021   Procedure: AMPUTATION  RIGHT 5TH RAY, INCISION AND DRAINAGE;  Surgeon: Edrick Kins, DPM;  Location: [facility];  Service: Podiatry;  Laterality: Right;   APPLICATION OF WOUND VAC Right 04/21/2021   Procedure: APPLICATION OF WOUND VAC;  Surgeon: Edrick Kins, DPM;  Location: [facility];  Service: Podiatry;  Laterality: Right;   BONE BIOPSY Left 06/27/2020   Procedure: BONE BIOPSY;  Surgeon: Caroline More, DPM;  Location: [facility];  Service: Podiatry;  Laterality: Left;   INCISION AND DRAINAGE Left 06/27/2020   Procedure: INCISION AND DRAINAGE;  Surgeon: Caroline More, DPM;  Location: [facility];  Service: Podiatry;  Laterality: Left;   IRRIGATION AND DEBRIDEMENT FOOT Right 04/21/2021   Procedure: IRRIGATION AND DEBRIDEMENT FOOT;  Surgeon: Edrick Kins, DPM;  Location: [facility];  Service: Podiatry;  Laterality: Right;   LOWER EXTREMITY ANGIOGRAPHY Left 06/30/2020   Procedure: Lower Extremity Angiography;  Surgeon: Algernon Huxley, MD;  Location: [place] [facility];  Service: Cardiovascular;  Laterality: Left;   LOWER EXTREMITY ANGIOGRAPHY Right 04/14/2021   Procedure: Lower Extremity Angiography;  Surgeon: Algernon Huxley, MD;  Location: [place] [facility];  Service: Cardiovascular;  Laterality: Right;   NO PAST SURGERIES     WOUND DEBRIDEMENT Right 04/19/2021   Procedure: DEBRIDEMENT WOUND;  Surgeon: Edrick Kins, DPM;  Location: [facility];  Service: Podiatry;  Laterality: Right;   No Known Allergies     Objective/Physical Exam Wound  #1: Along the plantar aspect of the right forefoot has demonstrated significant improvement and reepithelialization of skin.  Graft incorporation has demonstrated great reduction of the open wound.    There continues to be a portion of full-thickness wound to the plantar lateral aspect of the midfoot with serosanguineous drainage.  Granular wound base noted.  Periwound is macerated and callused.  No significant malodor noted.  There is no exposed bone muscle tendon ligament or joint.  The wound measures approximately 3.0 x 2.2 x 0.3 cm  Wound also noted to the first interdigital webspace measuring approximately 0.2 x 0.1 x 0.1 cm in depth.  The wound is very stable and continues to demonstrate slow steady improvement  Assessment: 1.  Diabetic foot ulcers x2 right foot 2.  Diabetes mellitus type 2; uncontrolled; with peripheral polyneuropathy   Plan of Care:  1. Patient was evaluated.  Medically necessary excisional debridement including subcutaneous tissue was performed using a tissue nipper.  Excisional debridement of all necrotic nonviable tissue down to healthier bleeding viable tissue was performed with postdebridement measurement same as pre- 2.  Puracol collagen wound dressing was applied and provided for the patient to apply daily.  Continue collagen dressings daily 3.  Continue minimal weightbearing as tolerated 4.  Order placed for dressing supplies to  [facility] pending 5.  Continue Defender Walking Boot to offload pressure from the wound 6. return to clinic in 2 weeks for follow-up x-ray  Edrick Kins, [place]  Dr. Edrick Kins, DPM    [address], [place] [address]                Office [phone]  Fax [phone]

## 2022-02-07 ENCOUNTER — Ambulatory Visit (INDEPENDENT_AMBULATORY_CARE_PROVIDER_SITE_OTHER): Payer: Managed Care, Other (non HMO)

## 2022-02-07 ENCOUNTER — Encounter: Payer: Self-pay | Admitting: Podiatry

## 2022-02-07 ENCOUNTER — Ambulatory Visit (INDEPENDENT_AMBULATORY_CARE_PROVIDER_SITE_OTHER): Payer: Managed Care, Other (non HMO) | Admitting: Podiatry

## 2022-02-07 ENCOUNTER — Other Ambulatory Visit: Payer: Self-pay

## 2022-02-07 DIAGNOSIS — E0843 Diabetes mellitus due to underlying condition with diabetic autonomic (poly)neuropathy: Secondary | ICD-10-CM | POA: Diagnosis not present

## 2022-02-07 DIAGNOSIS — L97512 Non-pressure chronic ulcer of other part of right foot with fat layer exposed: Secondary | ICD-10-CM

## 2022-02-07 NOTE — Progress Notes (Signed)
Subjective:  Patient presents today for follow-up evaluation of an ulcer to the plantar aspect of the right foot.  Patient states that he is doing well.  The wound is stable.  He has been applying the collagen as instructed.  No new complaints at this time  Past Medical History:  Diagnosis Date   Diabetes mellitus without complication ([place])    Hyperlipidemia    Hypertension    Past Surgical History:  Procedure Laterality Date   AMPUTATION Right 04/16/2021   Procedure: AMPUTATION  RIGHT 5TH RAY, INCISION AND DRAINAGE;  Surgeon: Edrick Kins, DPM;  Location: [facility];  Service: Podiatry;  Laterality: Right;   APPLICATION OF WOUND VAC Right 04/21/2021   Procedure: APPLICATION OF WOUND VAC;  Surgeon: Edrick Kins, DPM;  Location: [facility];  Service: Podiatry;  Laterality: Right;   BONE BIOPSY Left 06/27/2020   Procedure: BONE BIOPSY;  Surgeon: Caroline More, DPM;  Location: [facility];  Service: Podiatry;  Laterality: Left;   INCISION AND DRAINAGE Left 06/27/2020   Procedure: INCISION AND DRAINAGE;  Surgeon: Caroline More, DPM;  Location: [facility];  Service: Podiatry;  Laterality: Left;   IRRIGATION AND DEBRIDEMENT FOOT Right 04/21/2021   Procedure: IRRIGATION AND DEBRIDEMENT FOOT;  Surgeon: Edrick Kins, DPM;  Location: [facility];  Service: Podiatry;  Laterality: Right;   LOWER EXTREMITY ANGIOGRAPHY Left 06/30/2020   Procedure: Lower Extremity Angiography;  Surgeon: Algernon Huxley, MD;  Location: [facility];  Service: Cardiovascular;  Laterality: Left;   LOWER EXTREMITY ANGIOGRAPHY Right 04/14/2021   Procedure: Lower Extremity Angiography;  Surgeon: Algernon Huxley, MD;  Location: [facility];  Service: Cardiovascular;  Laterality: Right;   NO PAST SURGERIES     WOUND DEBRIDEMENT Right 04/19/2021   Procedure: DEBRIDEMENT WOUND;  Surgeon: Edrick Kins, DPM;  Location: [facility];  Service: Podiatry;  Laterality: Right;   No Known Allergies     Objective/Physical Exam Wound  #1: Along the plantar aspect of the right forefoot has demonstrated significant improvement and reepithelialization of skin.  Graft incorporation has demonstrated great reduction of the open wound.    There continues to be a portion of full-thickness wound to the plantar lateral aspect of the midfoot with serosanguineous drainage.  Granular wound base noted.  Periwound is macerated and callused.  No significant malodor noted.  There is no exposed bone muscle tendon ligament or joint.  The wound measures approximately 3.0 x 2.2 x 0.3 cm  Wound also noted to the first interdigital webspace measuring approximately 0.2 x 0.1 x 0.1 cm in depth.  The wound is very stable and continues to demonstrate slow steady improvement  Radiographic exam RT foot 02/07/2022 Cavus foot type noted.  Normal osseous mineralization.  No significant change since last x-rays taken.  There continues to be some osseous erosion and cortical irregularity to the cuboid and fifth metatarsal tubercle.  Mostly unchanged from x-rays on 11/08/2021.  No acute fractures identified  Assessment: 1.  Diabetic foot ulcers x2 right foot 2.  Diabetes mellitus type 2; uncontrolled; with peripheral polyneuropathy   Plan of Care:  1. Patient was evaluated.  Medically necessary excisional debridement including subcutaneous tissue was performed using a tissue nipper.  Excisional debridement of all necrotic nonviable tissue down to healthier bleeding viable tissue was performed with postdebridement measurement same as pre- 2.  Collagen wound dressing with Hydrofera Blue was applied followed by dry sterile dressings.  Continue Puracol collagen wound dressing and Hydrofera  Blue at home daily  3.  Continue minimal weightbearing as tolerated  4.  Continue Defender Walking Boot to offload pressure from the wound 5.  Return to clinic 2 weeks  Edrick Kins, DPM [facility]  Dr. Edrick Kins, DPM    [address], [place] [address]                Office [phone]  Fax [phone]

## 2022-02-08 NOTE — Progress Notes (Signed)
? ? ?02/09/2022 ?3:18 PM  ? ?[name] ?09/05/1966 ?[id number] ? ?Referring provider: [name], MD ?[address] ?[address] ?[place],  [place] [address] ? ?Chief Complaint  ?Patient presents with  ? Hypogonadism  ? ?Urological history: ?1. BPH with LU TS ?-PSA 0.6 01/2022 ?-I PSS 8/4 ? ?2. ED ?-contributing factors of age, BPH, DM, HTN, HLD, alcohol abuse and blood pressure medications ?-SHIM 4 ? ? ?HPI: ?[name] is a 56 y.o. male who presents today for discussion for TRT. ? ?Component ?    Latest Ref Rng & Units 01/12/2022 01/17/2022 01/27/2022  ?Testosterone ?    264 - 916 ng/dL 210 (L) 151 (L) 234 (L)  ? ?Patient had 2 morning testosterones 2 days apart that were below 300.  He does have issues with erectile dysfunction and fatigue and concentration issues ? ?PMH: ?Past Medical History:  ?Diagnosis Date  ? Diabetes mellitus without complication ([place])   ? Hyperlipidemia   ? Hypertension   ? ? ?Surgical History: ?Past Surgical History:  ?Procedure Laterality Date  ? AMPUTATION Right 04/16/2021  ? Procedure: AMPUTATION  RIGHT 5TH RAY, INCISION AND DRAINAGE;  Surgeon: Edrick Kins, DPM;  Location: [facility];  Service: Podiatry;  Laterality: Right;  ? APPLICATION OF WOUND VAC Right 04/21/2021  ? Procedure: APPLICATION OF WOUND VAC;  Surgeon: Edrick Kins, DPM;  Location: [facility];  Service: Podiatry;  Laterality: Right;  ? BONE BIOPSY Left 06/27/2020  ? Procedure: BONE BIOPSY;  Surgeon: Caroline More, DPM;  Location: [facility];  Service: Podiatry;  Laterality: Left;  ? INCISION AND DRAINAGE Left 06/27/2020  ? Procedure: INCISION AND DRAINAGE;  Surgeon: Caroline More, DPM;  Location: [facility];  Service: Podiatry;  Laterality: Left;  ? IRRIGATION AND DEBRIDEMENT FOOT Right 04/21/2021  ? Procedure: IRRIGATION AND DEBRIDEMENT FOOT;  Surgeon: Edrick Kins, DPM;  Location: [facility];  Service: Podiatry;  Laterality: Right;  ? LOWER EXTREMITY ANGIOGRAPHY Left 06/30/2020  ? Procedure: Lower Extremity Angiography;   Surgeon: Algernon Huxley, MD;  Location: [facility];  Service: Cardiovascular;  Laterality: Left;  ? LOWER EXTREMITY ANGIOGRAPHY Right 04/14/2021  ? Procedure: Lower Extremity Angiography;  Surgeon: Algernon Huxley, MD;  Location: [facility];  Service: Cardiovascular;  Laterality: Right;  ? NO PAST SURGERIES    ? WOUND DEBRIDEMENT Right 04/19/2021  ? Procedure: DEBRIDEMENT WOUND;  Surgeon: Edrick Kins, DPM;  Location: [facility];  Service: Podiatry;  Laterality: Right;  ? ? ?Home Medications:  ?Allergies as of 02/09/2022   ?No Known Allergies ?  ? ?  ?Medication List  ?  ? ?  ? Accurate as of February 09, 2022  3:18 PM. If you have any questions, ask your nurse or doctor.  ?  ?  ? ?  ? ?Accu-Chek Guide test strip ?Generic drug: glucose blood ?USE TO TEST BLOOD SUGAR UP TO FOUR TIMES DAILY AS DIRECTED ?  ?Accu-Chek Softclix Lancets lancets ?USE TO TEST BLOOD SUGAR UP TO FOUR TIMES DAILY AS DIRECTED ?  ?aspirin EC 81 MG tablet ?Take 81 mg by mouth daily. ?  ?B-12 1000 MCG Subl ?Place 1 tablet under the tongue daily. ?  ?blood glucose meter kit and supplies ?1 each by Other route in the morning, at noon, and at bedtime. Dispense based on patient and insurance preference. Use up to four times daily as directed. (FOR ICD-10 E10.9, E11.9). ?  ?Contour Next Control Normal Soln ?1 Bottle by In Vitro  route every 30 (thirty) days. ?  ?empagliflozin 25 MG Tabs tablet ?Commonly known as: Jardiance ?Take 1 tablet (25 mg total) by mouth daily. ?  ?glipiZIDE 5 MG 24 hr tablet ?Commonly known as: GLUCOTROL XL ?Take 1 tablet (5 mg total) by mouth daily with breakfast. ?  ?nebivolol 5 MG tablet ?Commonly known as: Bystolic ?Take 1 tablet (5 mg total) by mouth daily. ?  ?pioglitazone 15 MG tablet ?Commonly known as: Actos ?Take 1 tablet (15 mg total) by mouth daily. ?  ?rosuvastatin 40 MG tablet ?Commonly known as: CRESTOR ?Take 1 tablet (40 mg total) by mouth daily. ?  ?Rybelsus 7 MG Tabs ?Generic drug: Semaglutide ?Take 1  tablet by mouth daily. ?  ?sildenafil 100 MG tablet ?Commonly known as: VIAGRA ?Take 1 tablet (100 mg total) by mouth daily as needed for erectile dysfunction. Take two hours prior to intercourse on an empty stomach ?  ?torsemide 10 MG tablet ?Commonly known as: DEMADEX ?Take 10 mg by mouth daily. ?  ?valsartan-hydrochlorothiazide 320-25 MG tablet ?Commonly known as: Diovan HCT ?Take 1 tablet by mouth daily. ?  ?Vitamin D (Ergocalciferol) 1.25 MG (50000 UNIT) Caps capsule ?Commonly known as: DRISDOL ?TAKE 1 CAPSULE BY MOUTH EVERY 7 DAYS ?  ? ?  ? ? ?Allergies: No Known Allergies ? ?Family History: ?Family History  ?Problem Relation Age of Onset  ? Heart disease Father   ? Heart attack Father   ? Healthy Mother   ? Prostate cancer Neg Hx   ? Kidney cancer Neg Hx   ? Bladder Cancer Neg Hx   ? ? ?Social History:  reports that he has never smoked. He has never used smokeless tobacco. He reports current alcohol use of about 6.0 - 12.0 standard drinks per week. He reports that he does not use drugs. ? ?ROS: ?Pertinent ROS in HPI ? ?Physical Exam: ?BP (!) 185/91   Pulse 76   Ht _0  (1.803 m)   Wt 203 lb (92.1 kg)   BMI 28.31 kg/m?   ?Constitutional:  Well nourished. Alert and oriented, No acute distress. ?HEENT: [place] AT, moist mucus membranes.  Trachea midline ?Cardiovascular: No clubbing, cyanosis, or edema. ?Respiratory: Normal respiratory effort, no increased work of breathing. ?Neurologic: Grossly intact, no focal deficits, moving all 4 extremities. ?Psychiatric: Normal mood and affect.  ? ?Laboratory Data: ?N/A ? ? ?Pertinent Imaging: ?N/A ? ?Assessment & Plan:   ? ?1. Testosterone deficiency ?-Significant symptoms ?-Potential side effects of testosterone replacement were discussed including stimulation of benign prostatic growth with lower urinary tract symptoms; erythrocytosis; edema; gynecomastia; worsening sleep apnea; venous thromboembolism; testicular atrophy and infertility. Recent studies suggesting an  increased incidence of heart attack and stroke in patients taking testosterone was discussed. He was informed there is conflicting evidence regarding the impact of testosterone therapy on cardiovascular risk. The theoretical risk of growth stimulation of an undetected prostate cancer was also discussed.  He was informed that current evidence does not provide any definitive answers regarding the risks of testosterone therapy on prostate cancer and cardiovascular disease. The need for periodic monitoring of his testosterone level, PSA, hematocrit and DRE was discussed. ?-After discussing possible side effects of testosterone therapy, he has decided he would like to table the issue and continue exercising and have his testosterone level rechecked in 3 months to see if it improves with exercise alone ?-LH ?-Prolactin ? ?Return in about 3 months (around 05/12/2022) for testosterone level . ? ?These notes generated with voice recognition software. I  apologize for typographical errors. ? ?Aniya Jolicoeur, PA-C ? ?[place] ?[place], [place] [address] ?[phone] ?  ?I spent 15 minutes on the day of the encounter to include pre-visit record review, face-to-face time with the patient, and post-visit ordering of tests.  ?

## 2022-02-09 ENCOUNTER — Encounter: Payer: Self-pay | Admitting: Urology

## 2022-02-09 ENCOUNTER — Other Ambulatory Visit: Payer: Self-pay

## 2022-02-09 ENCOUNTER — Ambulatory Visit (INDEPENDENT_AMBULATORY_CARE_PROVIDER_SITE_OTHER): Payer: Managed Care, Other (non HMO) | Admitting: Urology

## 2022-02-09 VITALS — BP 185/91 | HR 76 | Ht 71.0 in | Wt 203.0 lb

## 2022-02-09 DIAGNOSIS — E349 Endocrine disorder, unspecified: Secondary | ICD-10-CM

## 2022-02-10 ENCOUNTER — Telehealth: Payer: Self-pay

## 2022-02-10 ENCOUNTER — Other Ambulatory Visit: Payer: Self-pay

## 2022-02-10 DIAGNOSIS — E349 Endocrine disorder, unspecified: Secondary | ICD-10-CM

## 2022-02-10 LAB — LUTEINIZING HORMONE: LH: 8.6 m[IU]/mL (ref 1.7–8.6)

## 2022-02-10 LAB — PROLACTIN: Prolactin: 18.4 ng/mL — ABNORMAL HIGH (ref 4.0–15.2)

## 2022-02-10 NOTE — Telephone Encounter (Signed)
-----   Message from Nori Riis, PA-C sent at 02/10/2022 10:34 AM EST ----- ?Please let Mr. Moyers know that his prolactin level is elevated and we need to repeat the blood work for confirmation. ?

## 2022-02-10 NOTE — Telephone Encounter (Signed)
-----   Message from Nori Riis, PA-C sent at 02/10/2022 10:34 AM EST ----- ?Please let Mr. Deisher know that his prolactin level is elevated and we need to repeat the blood work for confirmation. ?

## 2022-02-10 NOTE — Telephone Encounter (Signed)
ERROR; see separate encounter. ?

## 2022-02-10 NOTE — Telephone Encounter (Signed)
Notified pt as advised. Appt made for repeat Prolactin. Advised pt Larene Beach would follow up with him pending those results. Pt voiced understanding.  ?

## 2022-02-14 ENCOUNTER — Encounter: Payer: Self-pay | Admitting: Family Medicine

## 2022-02-14 ENCOUNTER — Ambulatory Visit: Payer: Managed Care, Other (non HMO) | Admitting: Family Medicine

## 2022-02-14 ENCOUNTER — Ambulatory Visit: Payer: Self-pay | Admitting: *Deleted

## 2022-02-14 VITALS — BP 184/112 | HR 77 | Temp 97.9°F | Resp 16 | Ht 71.0 in | Wt 197.9 lb

## 2022-02-14 DIAGNOSIS — E1129 Type 2 diabetes mellitus with other diabetic kidney complication: Secondary | ICD-10-CM | POA: Diagnosis not present

## 2022-02-14 DIAGNOSIS — I10 Essential (primary) hypertension: Secondary | ICD-10-CM

## 2022-02-14 DIAGNOSIS — R809 Proteinuria, unspecified: Secondary | ICD-10-CM

## 2022-02-14 DIAGNOSIS — H6122 Impacted cerumen, left ear: Secondary | ICD-10-CM | POA: Diagnosis not present

## 2022-02-14 MED ORDER — AMLODIPINE BESYLATE 5 MG PO TABS: 5.0000 mg | ORAL_TABLET | Freq: Every day | ORAL | 0 refills | Status: DC

## 2022-02-14 NOTE — Progress Notes (Signed)
? ? ?  SUBJECTIVE:  ? ?CHIEF COMPLAINT / HPI:  ? ?EAR COMPLAINT ?Duration:  today ?Involved ear(s): left  ?Pain: no ?Fever: no ?Otorrhea: no ?Upper respiratory infection symptoms: no ?Pruritus: no ?Hearing loss: yes ?Water immersion no ?Using Q-tips: yes ?Recurrent otitis media: no ?Treatments attempted: none ? ?Hypertension: ?- Medications: diovan, bystolic ?- Compliance: good ?- Checking BP at home: yes, 170-200s SBP ?- Denies any SOB, CP, vision changes, medication SE. ?- previously on amlodipine, stopped for hypotension. Does not think he had increased LE swelling with this. ?- last saw Nephro 11/2021, considering starting [place] but wants DM better controlled first. F/u in July. ? ? ?OBJECTIVE:  ? ?BP (!) 184/112   Pulse 77   Temp 97.9 ?F (36.6 ?C) (Oral)   Resp 16   Ht '5\' 11"'$  (1.803 m)   Wt 197 lb 14.4 oz (89.8 kg)   SpO2 98%   BMI 27.60 kg/m?   ?Gen: well appearing, in NAD ?HEENT: L TM view obstructed by cerumen. R TM visible without bulging, erythema, purulence.  ?Card: RRR ?Lungs: CTAB ?Ext: WWP, no edema ? ? ?ASSESSMENT/PLAN:  ? ?Hypertension ?Chronic, uncontrolled. Restart amlodipine. Obtaining labs. F/u in 1 month. ?  ?Impacted cerumen ?Improved s/p lavage but still with impaction. Recommend debrox ear drops. Can try re-lavage at follow up if still needed. ? ?Myles Gip, DO ?

## 2022-02-14 NOTE — Assessment & Plan Note (Signed)
Chronic, uncontrolled. Restart amlodipine. Obtaining labs. F/u in 1 month. ?

## 2022-02-14 NOTE — Patient Instructions (Addendum)
It was great to see you! ? ?Our plans for today:  ?- We are restarting amlodipine for your blood pressure. ?- Use generic debrox ear drops for your ear wax. You can get this over the counter and is usually in a green box.  ?- Come back in 1 month. ? ?We are checking some labs today, we will release these results to your MyChart. ? ?Take care and seek immediate care sooner if you develop any concerns.  ? ?Dr. Ky Barban ? ? ? ?

## 2022-02-14 NOTE — Telephone Encounter (Signed)
?  Chief Complaint: Decreased hearing left ear, ringing ?Symptoms: "Like ear is clogged, can't hear as well, high pitched noise in left ear. ?Frequency: this AM ?Pertinent Negatives: Patient denies dizziness, pain ?Disposition: '[]'$ ED /'[]'$ Urgent Care (no appt availability in office) / '[x]'$ Appointment(In office/virtual)/ '[]'$  [facility]/ '[]'$ Home Care/ '[]'$ Refused Recommended Disposition /'[]'$ [facility]/ '[]'$  Follow-up with PCP ?Additional Notes: Appt secured for today at 1100 ?Reason for Disposition ? [1] Tinnitus (ringing, hissing, beating) AND [2] only or mainly in 1 ear ? ?Answer Assessment - Initial Assessment Questions ?1. DESCRIPTION: "What type of hearing problem are you having? Describe it for me." (e.g., complete hearing loss, partial loss) ?    Left ear, hearing loss, "High pitch noise" ?2. LOCATION: "One or both ears?" If one, ask: "Which ear?" ?    One, left ?3. SEVERITY: "Can you hear anything?" If Yes, ask: "What can you hear?" (e.g., ticking watch, whisper, talking) ?  - MILD:  Difficulty hearing soft speech, quiet library sounds, or speech from a distance or over background noise. ?  - MODERATE: Difficulty hearing normal speech even at closed distances. ?  - SEVERE: Unable to hear most normal conversation and talking; only able to hear loud sounds such as an alarm clock. ?    no ?4. ONSET: "When did this begin?" "Did it start suddenly or come on gradually?" ?    This AM ?5. PATTERN: "Does this come and go, or has it been constant since it started?" ?    Constant ?6. PAIN: "Is there any pain in your ear(s)?"  (Scale 1-10; or mild, moderate, severe) ?  - NONE (0): no pain ?  - MILD (1-3): doesn't interfere with normal activities  ?  - MODERATE (4-7): interferes with normal activities or awakens from sleep  ?  - SEVERE (8-10): excruciating pain, unable to do any normal activities  ?    No  pain ?7. CAUSE: "What do you think is causing this hearing problem?" ?    Maybe wax ?8. OTHER SYMPTOMS:  "Do you have any other symptoms?" (e.g., dizziness, ringing in ears) ?    "Feel something sticky in ear"  Runny nose ? ?Protocols used: Hearing Loss or Change-A-AH ? ?

## 2022-02-16 LAB — BASIC METABOLIC PANEL
BUN/Creatinine Ratio: 29 — ABNORMAL HIGH (ref 9–20)
BUN: 38 mg/dL — ABNORMAL HIGH (ref 6–24)
CO2: 21 mmol/L (ref 20–29)
Calcium: 10.4 mg/dL — ABNORMAL HIGH (ref 8.7–10.2)
Chloride: 96 mmol/L (ref 96–106)
Creatinine, Ser: 1.33 mg/dL — ABNORMAL HIGH (ref 0.76–1.27)
Glucose: 170 mg/dL — ABNORMAL HIGH (ref 70–99)
Potassium: 4.4 mmol/L (ref 3.5–5.2)
Sodium: 147 mmol/L — ABNORMAL HIGH (ref 134–144)
eGFR: 63 mL/min/{1.73_m2} (ref >59)

## 2022-02-17 ENCOUNTER — Emergency Department
Admission: EM | Admit: 2022-02-17 | Discharge: 2022-02-17 | Disposition: A | Discharge status: Home / Self Care | Payer: Managed Care, Other (non HMO) | Attending: Emergency Medicine | Admitting: Emergency Medicine

## 2022-02-17 ENCOUNTER — Other Ambulatory Visit: Payer: Self-pay

## 2022-02-17 ENCOUNTER — Encounter: Payer: Self-pay | Admitting: Emergency Medicine

## 2022-02-17 DIAGNOSIS — H9202 Otalgia, left ear: Secondary | ICD-10-CM | POA: Insufficient documentation

## 2022-02-17 MED ORDER — CETIRIZINE HCL 10 MG PO TABS: 10.0000 mg | ORAL_TABLET | Freq: Every day | ORAL | 1 refills | Status: DC

## 2022-02-17 NOTE — ED Notes (Signed)
See triage note  presents with left ear pain  states he is having decreased hearing  no fever ?

## 2022-02-17 NOTE — Discharge Instructions (Addendum)
Take 10 mg of Zyrtec once daily. ?Take one spray of Flonase each side.  ?

## 2022-02-17 NOTE — ED Provider Notes (Signed)
? ?[facility] ?Provider Note ? ?Patient Contact: 3:17 PM (approximate) ? ? ?History  ? ?Otalgia ? ? ?HPI ? ?[name] is a 56 y.o. male presents to the emergency department with left ear pain.  Patient states his hearing is muffled.  He has had recent cerumen disimpaction but states that his muffled hearing has persisted.  No discharge from the left ear.  No fever or chills. ? ?  ? ? ?Physical Exam  ? ?Triage Vital Signs: ?ED Triage Vitals  ?Enc Vitals Group  ?   BP --   ?   Pulse Rate 02/17/22 1450 80  ?   Resp 02/17/22 1450 16  ?   Temp 02/17/22 1450 97.9 ?F (36.6 ?C)  ?   Temp Source 02/17/22 1450 Oral  ?   SpO2 02/17/22 1450 100 %  ?   Weight 02/17/22 1454 197 lb 12 oz (89.7 kg)  ?   Height 02/17/22 1446 '5\' 11"'$  (1.803 m)  ?   Head Circumference --   ?   Peak Flow --   ?   Pain Score 02/17/22 1445 1  ?   Pain Loc --   ?   Pain Edu? --   ?   Excl. in [place]? --   ? ? ?Most recent vital signs: ?Vitals:  ? 02/17/22 1450  ?Pulse: 80  ?Resp: 16  ?Temp: 97.9 ?F (36.6 ?C)  ?SpO2: 100%  ? ? ? ?General: Alert and in no acute distress. ?Eyes:  PERRL. EOMI. ?Head: No acute traumatic findings ?ENT: ?     Ears: Left TM is effused.  ?     Nose: No congestion/rhinnorhea. ?     Mouth/Throat: Mucous membranes are moist. ?Neck: No stridor. No cervical spine tenderness to palpation. ?Cardiovascular:  Good peripheral perfusion ?Respiratory: Normal respiratory effort without tachypnea or retractions. Lungs CTAB. Good air entry to the bases with no decreased or absent breath sounds. ?Gastrointestinal: Bowel sounds ?4 quadrants. Soft and nontender to palpation. No guarding or rigidity. No palpable masses. No distention. No CVA tenderness. ?Musculoskeletal: Full range of motion to all extremities.  ?Neurologic:  No gross focal neurologic deficits are appreciated.  ?Skin:   No rash noted ?Other: ? ? ?ED Results / Procedures / Treatments  ? ?Labs ?(all labs ordered are listed, but only abnormal results are  displayed) ?Labs Reviewed - No data to display ? ? ? ? ?PROCEDURES: ? ?Critical Care performed: No ? ?Procedures ? ? ?MEDICATIONS ORDERED IN ED: ?Medications - No data to display ? ? ?IMPRESSION / MDM / ASSESSMENT AND PLAN / ED COURSE  ?I reviewed the triage vital signs and the nursing notes. ?             ?               ? ?Differential diagnosis includes, but is not limited to, middle ear effusion, otitis media ? ?56 year old male presents to the emergency department with left ear pain. ? ?Vital signs are reassuring at triage.  On physical exam, patient was alert, active and nontoxic-appearing.  Suspect eustachian tube dysfunction and middle ear effusion.  We will treat with Zyrtec and Flonase. ?  ? ? ?FINAL CLINICAL IMPRESSION(S) / ED DIAGNOSES  ? ?Final diagnoses:  ?Left ear pain  ? ? ? ?Rx / DC Orders  ? ?ED Discharge Orders   ? ?      Ordered  ?  cetirizine (ZYRTEC ALLERGY) 10 MG tablet  Daily       ?  02/17/22 1515  ? ?  ?  ? ?  ? ? ? ?Note:  This document was prepared using Dragon voice recognition software and may include unintentional dictation errors. ?  ?Lannie Fields, PA-C ?02/17/22 1523 ? ?  ?Naaman Plummer, MD ?02/17/22 1555 ? ?

## 2022-02-17 NOTE — ED Triage Notes (Signed)
Pt comes into the ED via POV c/o left ear pain that started earlier this week.  ?

## 2022-02-20 ENCOUNTER — Other Ambulatory Visit: Payer: Managed Care, Other (non HMO)

## 2022-02-20 ENCOUNTER — Other Ambulatory Visit: Payer: Self-pay

## 2022-02-20 DIAGNOSIS — E349 Endocrine disorder, unspecified: Secondary | ICD-10-CM

## 2022-02-21 ENCOUNTER — Telehealth: Payer: Self-pay | Admitting: Family Medicine

## 2022-02-21 ENCOUNTER — Ambulatory Visit: Payer: Managed Care, Other (non HMO) | Admitting: Podiatry

## 2022-02-21 DIAGNOSIS — E1169 Type 2 diabetes mellitus with other specified complication: Secondary | ICD-10-CM

## 2022-02-21 DIAGNOSIS — E118 Type 2 diabetes mellitus with unspecified complications: Secondary | ICD-10-CM

## 2022-02-21 LAB — PROLACTIN: Prolactin: 10.9 ng/mL (ref 4.0–15.2)

## 2022-02-22 NOTE — Telephone Encounter (Signed)
Pt states he has been out of his medications for a week. ?empagliflozin (JARDIANCE) 25 MG TABS tablet ?glipiZIDE (GLUCOTROL XL) 5 MG 24 hr tablet ?Pt states he thought the pharmacy had reached out sooner to [place], but they did not. ? ?[id number] - [place], [place] ?

## 2022-02-23 NOTE — Telephone Encounter (Signed)
Copied from [place] [id number]. Topic: General - Other >> Feb 23, 2022  1:45 PM Loma Boston wrote: [phone] just spoke with front desk, pt has been without diabetic since 14th and very frustrated as has an appt and was seen in Jan. Assured that Dr Ancil Boozer  nurse will return call as soon as she is finished triaging. Pt best# [phone]

## 2022-02-24 ENCOUNTER — Other Ambulatory Visit: Payer: Self-pay | Admitting: Family Medicine

## 2022-02-24 DIAGNOSIS — E118 Type 2 diabetes mellitus with unspecified complications: Secondary | ICD-10-CM

## 2022-02-24 DIAGNOSIS — E1169 Type 2 diabetes mellitus with other specified complication: Secondary | ICD-10-CM

## 2022-02-24 DIAGNOSIS — M79676 Pain in unspecified toe(s): Secondary | ICD-10-CM

## 2022-02-24 MED ORDER — GLIPIZIDE ER 5 MG PO TB24: 5.0000 mg | ORAL_TABLET | Freq: Every day | ORAL | 1 refills | Status: DC

## 2022-02-24 MED ORDER — EMPAGLIFLOZIN 25 MG PO TABS: ORAL_TABLET | ORAL | 1 refills | Status: DC

## 2022-02-24 NOTE — Telephone Encounter (Signed)
Patient called in frustrated that he waited on his medication and that one was called in and its only 30 days and his insurance will not cover it JARDIANCE 25 MG TABS tablet  and glipiZIDE (GLUCOTROL XL) 5 MG 24 hr tablet need 90 day Rx please  ?

## 2022-02-27 ENCOUNTER — Ambulatory Visit: Payer: Managed Care, Other (non HMO) | Admitting: Family Medicine

## 2022-03-07 ENCOUNTER — Ambulatory Visit: Payer: Managed Care, Other (non HMO) | Admitting: Podiatry

## 2022-03-07 ENCOUNTER — Other Ambulatory Visit: Payer: Self-pay

## 2022-03-07 DIAGNOSIS — L97512 Non-pressure chronic ulcer of other part of right foot with fat layer exposed: Secondary | ICD-10-CM

## 2022-03-07 DIAGNOSIS — E0843 Diabetes mellitus due to underlying condition with diabetic autonomic (poly)neuropathy: Secondary | ICD-10-CM

## 2022-03-07 NOTE — Progress Notes (Signed)
? ?Subjective:  ?Patient presents today for follow-up evaluation of an ulcer to the plantar aspect of the right foot.  Patient states that he is doing well.  The wound is stable.  He has been applying the collagen as instructed.  No new complaints at this time ? ?Past Medical History:  ?Diagnosis Date  ? Diabetes mellitus without complication ([place])   ? Hyperlipidemia   ? Hypertension   ? ?Past Surgical History:  ?Procedure Laterality Date  ? AMPUTATION Right 04/16/2021  ? Procedure: AMPUTATION  RIGHT 5TH RAY, INCISION AND DRAINAGE;  Surgeon: Edrick Kins, DPM;  Location: [facility];  Service: Podiatry;  Laterality: Right;  ? APPLICATION OF WOUND VAC Right 04/21/2021  ? Procedure: APPLICATION OF WOUND VAC;  Surgeon: Edrick Kins, DPM;  Location: [facility];  Service: Podiatry;  Laterality: Right;  ? BONE BIOPSY Left 06/27/2020  ? Procedure: BONE BIOPSY;  Surgeon: Caroline More, DPM;  Location: [facility];  Service: Podiatry;  Laterality: Left;  ? INCISION AND DRAINAGE Left 06/27/2020  ? Procedure: INCISION AND DRAINAGE;  Surgeon: Caroline More, DPM;  Location: [facility];  Service: Podiatry;  Laterality: Left;  ? IRRIGATION AND DEBRIDEMENT FOOT Right 04/21/2021  ? Procedure: IRRIGATION AND DEBRIDEMENT FOOT;  Surgeon: Edrick Kins, DPM;  Location: [facility];  Service: Podiatry;  Laterality: Right;  ? LOWER EXTREMITY ANGIOGRAPHY Left 06/30/2020  ? Procedure: Lower Extremity Angiography;  Surgeon: Algernon Huxley, MD;  Location: [place] [facility];  Service: Cardiovascular;  Laterality: Left;  ? LOWER EXTREMITY ANGIOGRAPHY Right 04/14/2021  ? Procedure: Lower Extremity Angiography;  Surgeon: Algernon Huxley, MD;  Location: [place] [facility];  Service: Cardiovascular;  Laterality: Right;  ? NO PAST SURGERIES    ? WOUND DEBRIDEMENT Right 04/19/2021  ? Procedure: DEBRIDEMENT WOUND;  Surgeon: Edrick Kins, DPM;  Location: [facility];  Service: Podiatry;  Laterality: Right;  ? ?No Known Allergies ? ? ? ? ?Objective/Physical Exam ?Wound  #1: Along the plantar aspect of the right forefoot has demonstrated significant improvement and reepithelialization of skin.  Graft incorporation has demonstrated great reduction of the open wound.   ? ?There continues to be a portion of full-thickness wound to the plantar lateral aspect of the midfoot with serosanguineous drainage.  Granular wound base noted.  Periwound is macerated and callused.  No significant malodor noted.  There is no exposed bone muscle tendon ligament or joint.  The wound measures approximately 3.0 x 2.2 x 0.3 cm ? ?Wound also noted to the first interdigital webspace measuring approximately 0.2 x 0.1 x 0.1 cm in depth.  The wound is very stable and continues to demonstrate slow steady improvement ? ?Radiographic exam RT foot 02/07/2022 ?Cavus foot type noted.  Normal osseous mineralization.  No significant change since last x-rays taken.  There continues to be some osseous erosion and cortical irregularity to the cuboid and fifth metatarsal tubercle.  Mostly unchanged from x-rays on 11/08/2021.  No acute fractures identified ? ?Assessment: ?1.  Diabetic foot ulcers x2 right foot ?2.  Diabetes mellitus type 2; uncontrolled; with peripheral polyneuropathy ? ? ?Plan of Care:  ?1. Patient was evaluated.  Medically necessary excisional debridement including subcutaneous tissue was performed using a tissue nipper.  Excisional debridement of all necrotic nonviable tissue down to healthier bleeding viable tissue was performed with postdebridement measurement same as pre- ?2.  Collagen wound dressing with Hydrofera Blue was applied followed by dry sterile dressings.  Continue Puracol collagen wound dressing and Hydrofera  Blue at home daily  ?3.  Continue minimal weightbearing as tolerated  ?4.  Continue Defender Walking Boot to offload pressure from the wound ?5.  Return to clinic 2 weeks ? ?Edrick Kins, DPM ?[place] ? ?Dr. Edrick Kins, DPM  ?  ?[address]                                     ?[place], [place] [address]                ?Office [phone]  ?Fax [phone] ? ? ? ? ? ? ? ? ? ? ? ? ?

## 2022-03-10 ENCOUNTER — Other Ambulatory Visit: Payer: Self-pay | Admitting: Family Medicine

## 2022-03-10 DIAGNOSIS — E1169 Type 2 diabetes mellitus with other specified complication: Secondary | ICD-10-CM

## 2022-03-14 ENCOUNTER — Ambulatory Visit: Payer: Managed Care, Other (non HMO) | Admitting: Family Medicine

## 2022-03-20 ENCOUNTER — Other Ambulatory Visit: Payer: Self-pay | Admitting: Family Medicine

## 2022-04-04 ENCOUNTER — Ambulatory Visit (INDEPENDENT_AMBULATORY_CARE_PROVIDER_SITE_OTHER): Payer: Managed Care, Other (non HMO) | Admitting: Podiatry

## 2022-04-04 ENCOUNTER — Other Ambulatory Visit: Payer: Self-pay | Admitting: Podiatry

## 2022-04-04 DIAGNOSIS — E0843 Diabetes mellitus due to underlying condition with diabetic autonomic (poly)neuropathy: Secondary | ICD-10-CM | POA: Diagnosis not present

## 2022-04-04 DIAGNOSIS — L97512 Non-pressure chronic ulcer of other part of right foot with fat layer exposed: Secondary | ICD-10-CM | POA: Diagnosis not present

## 2022-04-04 DIAGNOSIS — M86671 Other chronic osteomyelitis, right ankle and foot: Secondary | ICD-10-CM

## 2022-04-04 NOTE — Progress Notes (Signed)
? ?Subjective:  ?Patient presents today for follow-up evaluation of an ulcer to the plantar aspect of the right foot.  Patient continues to do well.  They have been changing the dressings and applying collagen daily to the wound.  He is also wearing the Defender offloading cam boot.  No new complaints at this time ? ?Past Medical History:  ?Diagnosis Date  ? Diabetes mellitus without complication ([place])   ? Hyperlipidemia   ? Hypertension   ? ?Past Surgical History:  ?Procedure Laterality Date  ? AMPUTATION Right 04/16/2021  ? Procedure: AMPUTATION  RIGHT 5TH RAY, INCISION AND DRAINAGE;  Surgeon: Edrick Kins, DPM;  Location: [facility];  Service: Podiatry;  Laterality: Right;  ? APPLICATION OF WOUND VAC Right 04/21/2021  ? Procedure: APPLICATION OF WOUND VAC;  Surgeon: Edrick Kins, DPM;  Location: [facility];  Service: Podiatry;  Laterality: Right;  ? BONE BIOPSY Left 06/27/2020  ? Procedure: BONE BIOPSY;  Surgeon: Caroline More, DPM;  Location: [facility];  Service: Podiatry;  Laterality: Left;  ? INCISION AND DRAINAGE Left 06/27/2020  ? Procedure: INCISION AND DRAINAGE;  Surgeon: Caroline More, DPM;  Location: [facility];  Service: Podiatry;  Laterality: Left;  ? IRRIGATION AND DEBRIDEMENT FOOT Right 04/21/2021  ? Procedure: IRRIGATION AND DEBRIDEMENT FOOT;  Surgeon: Edrick Kins, DPM;  Location: [facility];  Service: Podiatry;  Laterality: Right;  ? LOWER EXTREMITY ANGIOGRAPHY Left 06/30/2020  ? Procedure: Lower Extremity Angiography;  Surgeon: Algernon Huxley, MD;  Location: [place] [facility];  Service: Cardiovascular;  Laterality: Left;  ? LOWER EXTREMITY ANGIOGRAPHY Right 04/14/2021  ? Procedure: Lower Extremity Angiography;  Surgeon: Algernon Huxley, MD;  Location: [place] [facility];  Service: Cardiovascular;  Laterality: Right;  ? NO PAST SURGERIES    ? WOUND DEBRIDEMENT Right 04/19/2021  ? Procedure: DEBRIDEMENT WOUND;  Surgeon: Edrick Kins, DPM;  Location: [facility];  Service: Podiatry;  Laterality: Right;  ? ?No  Known Allergies ? ? ? ? ? ?Objective/Physical Exam ?Wounds noted along the plantar lateral aspect of the patient's right foot.  The most posterior wound does actually probe to bone in the deeper portion.  Overall the most proximal wound measures approximately 5.0 x 6.0 x 0.4 cm.  There is a moderate amount of slough fibrin and necrotic tissue noted.  Wound base is red with granulation tissue mixed throughout.  No malodor.  Moderate serosanguineous drainage. ? ?Radiographic exam RT foot 02/07/2022 ?Cavus foot type noted.  Normal osseous mineralization.  No significant change since last x-rays taken.  There continues to be some osseous erosion and cortical irregularity to the cuboid and fifth metatarsal tubercle.  Mostly unchanged from x-rays on 11/08/2021.  No acute fractures identified ? ?Assessment: ?1.  Diabetic foot ulcers x2 right foot ?2.  Diabetes mellitus type 2; uncontrolled; with peripheral polyneuropathy ? ? ?Plan of Care:  ?1. Patient was evaluated.  Medically necessary excisional debridement including muscle and deep fascial tissue was performed using a tissue nipper.  Excisional debridement of all necrotic nonviable tissue down to healthier bleeding viable tissue was performed with postdebridement measurement same as pre- ?2.  For now we are going to recommend Betadine daily to the wound with dry sterile dressings ?3.  Continue minimal weightbearing as tolerated in the Defender Walking Boot to offload pressure from the wound ?4.  Cultures were taken of the deeper portion of the wound and sent to pathology for culture and sensitivity.  We will wait to prescribe antibiotics  until we get the results.  ?5.  We did discuss other treatment options and referrals including return to infectious disease as well as the wound care center.  Patient currently declined both.   ?6.  Return to clinic 3 weeks ? ?Edrick Kins, DPM ?[place] ? ?Dr. Edrick Kins, DPM  ?  ?[address]                                     ?[place], [place] [address]                ?Office [phone]  ?Fax [phone] ? ? ? ? ? ? ? ? ? ? ? ? ?

## 2022-04-05 ENCOUNTER — Other Ambulatory Visit: Payer: Self-pay | Admitting: Family Medicine

## 2022-04-05 DIAGNOSIS — E559 Vitamin D deficiency, unspecified: Secondary | ICD-10-CM

## 2022-04-10 ENCOUNTER — Other Ambulatory Visit: Payer: Self-pay | Admitting: Podiatry

## 2022-04-10 MED ORDER — AMOXICILLIN-POT CLAVULANATE 875-125 MG PO TABS: 1.0000 | ORAL_TABLET | Freq: Two times a day (BID) | ORAL | 1 refills | Status: DC

## 2022-04-10 NOTE — Progress Notes (Signed)
As per cultures taken last visit.  Augmentin 875/125 mg #21 refill ?

## 2022-04-11 LAB — HM DIABETES EYE EXAM

## 2022-04-12 LAB — WOUND CULTURE

## 2022-04-14 ENCOUNTER — Other Ambulatory Visit: Payer: Self-pay | Admitting: Podiatry

## 2022-04-25 ENCOUNTER — Ambulatory Visit: Payer: Managed Care, Other (non HMO) | Admitting: Podiatry

## 2022-04-25 DIAGNOSIS — E0843 Diabetes mellitus due to underlying condition with diabetic autonomic (poly)neuropathy: Secondary | ICD-10-CM | POA: Diagnosis not present

## 2022-04-25 DIAGNOSIS — L97512 Non-pressure chronic ulcer of other part of right foot with fat layer exposed: Secondary | ICD-10-CM | POA: Diagnosis not present

## 2022-04-25 NOTE — Progress Notes (Signed)
? ?Subjective:  ?Patient presents today for follow-up evaluation of an ulcer to the plantar aspect of the right foot.  Patient continues to do well.  They have been changing the dressings and applying collagen daily to the wound.  He is also wearing the Defender offloading cam boot.  No new complaints at this time ? ?Past Medical History:  ?Diagnosis Date  ? Diabetes mellitus without complication ([place])   ? Hyperlipidemia   ? Hypertension   ? ?Past Surgical History:  ?Procedure Laterality Date  ? AMPUTATION Right 04/16/2021  ? Procedure: AMPUTATION  RIGHT 5TH RAY, INCISION AND DRAINAGE;  Surgeon: Edrick Kins, DPM;  Location: [facility];  Service: Podiatry;  Laterality: Right;  ? APPLICATION OF WOUND VAC Right 04/21/2021  ? Procedure: APPLICATION OF WOUND VAC;  Surgeon: Edrick Kins, DPM;  Location: [facility];  Service: Podiatry;  Laterality: Right;  ? BONE BIOPSY Left 06/27/2020  ? Procedure: BONE BIOPSY;  Surgeon: Caroline More, DPM;  Location: [facility];  Service: Podiatry;  Laterality: Left;  ? INCISION AND DRAINAGE Left 06/27/2020  ? Procedure: INCISION AND DRAINAGE;  Surgeon: Caroline More, DPM;  Location: [facility];  Service: Podiatry;  Laterality: Left;  ? IRRIGATION AND DEBRIDEMENT FOOT Right 04/21/2021  ? Procedure: IRRIGATION AND DEBRIDEMENT FOOT;  Surgeon: Edrick Kins, DPM;  Location: [facility];  Service: Podiatry;  Laterality: Right;  ? LOWER EXTREMITY ANGIOGRAPHY Left 06/30/2020  ? Procedure: Lower Extremity Angiography;  Surgeon: Algernon Huxley, MD;  Location: [place] [facility];  Service: Cardiovascular;  Laterality: Left;  ? LOWER EXTREMITY ANGIOGRAPHY Right 04/14/2021  ? Procedure: Lower Extremity Angiography;  Surgeon: Algernon Huxley, MD;  Location: [place] [facility];  Service: Cardiovascular;  Laterality: Right;  ? NO PAST SURGERIES    ? WOUND DEBRIDEMENT Right 04/19/2021  ? Procedure: DEBRIDEMENT WOUND;  Surgeon: Edrick Kins, DPM;  Location: [facility];  Service: Podiatry;  Laterality: Right;  ? ?No  Known Allergies ? ? ? ? ?Objective/Physical Exam ?There is only a single solitary wound noted to the plantar aspect of the right foot measuring approximately 2.5 x 2.0 x 0.3 cm.  To the noted ulceration there is some superficial eschar to the posterior portion.  The remaining wound base is mostly granular.  Minimal fibrotic debris.  Minimal serosanguineous drainage noted.  No malodor.  There is no exposed bone muscle tendon ligament or joint.  Periwound is callused with some slight skin maceration.  Please see above noted photo ? ?Radiographic exam RT foot 02/07/2022 ?Cavus foot type noted.  Normal osseous mineralization.  No significant change since last x-rays taken.  There continues to be some osseous erosion and cortical irregularity to the cuboid and fifth metatarsal tubercle.  Mostly unchanged from x-rays on 11/08/2021.  No acute fractures identified ? ?Assessment: ?1.  Diabetic foot ulcers x2 right foot ?2.  Diabetes mellitus type 2; uncontrolled; with peripheral polyneuropathy.  Last A1c 11/09/2021 was 8.9 ? ? ?Plan of Care:  ?1. Patient was evaluated.  Medically necessary excisional debridement including muscle and deep fascial tissue was performed using a tissue nipper.  Excisional debridement of all necrotic nonviable tissue down to healthier bleeding viable tissue was performed with postdebridement measurement same as pre- ?2.  Continue Betadine and dry sterile dressings ?3.  Continue Defender Walking boot ?4.  Overall there is significant improvement with each visit.  We will continue the same regimen of Betadine wet-to-dry dressings daily or every other day ?5.  Return to  clinic 3 weeks at which time we will do follow-up x-rays ? ?Edrick Kins, DPM ?[place] ? ?Dr. Edrick Kins, DPM  ?  ?[address]                                    ?[place], [place] [address]                ?Office [phone]  ?Fax [phone] ? ? ? ? ? ? ? ? ? ? ? ? ?

## 2022-04-27 ENCOUNTER — Other Ambulatory Visit: Payer: Self-pay | Admitting: Family Medicine

## 2022-05-10 ENCOUNTER — Other Ambulatory Visit: Payer: Self-pay

## 2022-05-10 DIAGNOSIS — E349 Endocrine disorder, unspecified: Secondary | ICD-10-CM

## 2022-05-16 ENCOUNTER — Other Ambulatory Visit: Payer: Managed Care, Other (non HMO)

## 2022-05-16 ENCOUNTER — Ambulatory Visit (INDEPENDENT_AMBULATORY_CARE_PROVIDER_SITE_OTHER): Payer: Managed Care, Other (non HMO) | Admitting: Podiatry

## 2022-05-16 DIAGNOSIS — E349 Endocrine disorder, unspecified: Secondary | ICD-10-CM

## 2022-05-16 DIAGNOSIS — L97512 Non-pressure chronic ulcer of other part of right foot with fat layer exposed: Secondary | ICD-10-CM

## 2022-05-16 DIAGNOSIS — E0843 Diabetes mellitus due to underlying condition with diabetic autonomic (poly)neuropathy: Secondary | ICD-10-CM

## 2022-05-16 LAB — HM DIABETES FOOT EXAM

## 2022-05-16 NOTE — Progress Notes (Signed)
Subjective:  Patient presents today for follow-up evaluation of an ulcer to the plantar aspect of the right foot.  Patient continues to do well.  They have been changing the dressings and applying collagen daily to the wound.  He is also wearing the Defender offloading cam boot.  No new complaints at this time  Past Medical History:  Diagnosis Date   Diabetes mellitus without complication ([place])    Hyperlipidemia    Hypertension    Past Surgical History:  Procedure Laterality Date   AMPUTATION Right 04/16/2021   Procedure: AMPUTATION  RIGHT 5TH RAY, INCISION AND DRAINAGE;  Surgeon: Edrick Kins, DPM;  Location: [facility];  Service: Podiatry;  Laterality: Right;   APPLICATION OF WOUND VAC Right 04/21/2021   Procedure: APPLICATION OF WOUND VAC;  Surgeon: Edrick Kins, DPM;  Location: [facility];  Service: Podiatry;  Laterality: Right;   BONE BIOPSY Left 06/27/2020   Procedure: BONE BIOPSY;  Surgeon: Caroline More, DPM;  Location: [facility];  Service: Podiatry;  Laterality: Left;   INCISION AND DRAINAGE Left 06/27/2020   Procedure: INCISION AND DRAINAGE;  Surgeon: Caroline More, DPM;  Location: [facility];  Service: Podiatry;  Laterality: Left;   IRRIGATION AND DEBRIDEMENT FOOT Right 04/21/2021   Procedure: IRRIGATION AND DEBRIDEMENT FOOT;  Surgeon: Edrick Kins, DPM;  Location: [facility];  Service: Podiatry;  Laterality: Right;   LOWER EXTREMITY ANGIOGRAPHY Left 06/30/2020   Procedure: Lower Extremity Angiography;  Surgeon: Algernon Huxley, MD;  Location: [place] [facility];  Service: Cardiovascular;  Laterality: Left;   LOWER EXTREMITY ANGIOGRAPHY Right 04/14/2021   Procedure: Lower Extremity Angiography;  Surgeon: Algernon Huxley, MD;  Location: [facility] [facility];  Service: Cardiovascular;  Laterality: Right;   NO PAST SURGERIES     WOUND DEBRIDEMENT Right 04/19/2021   Procedure: DEBRIDEMENT WOUND;  Surgeon: Edrick Kins, DPM;  Location: [facility];  Service: Podiatry;  Laterality: Right;   No  Known Allergies   Objective/Physical Exam There is only a single solitary wound noted to the plantar aspect of the right foot measuring approximately 3.0x2.5 x 0.3 cm.  There is some slight increase of the size of the wound today.  To the noted ulceration there is some superficial eschar to the posterior portion.  The remaining wound base is mostly granular.  Minimal fibrotic debris.  Minimal serosanguineous drainage noted.  No malodor.  There is no exposed bone muscle tendon ligament or joint.  Periwound is callused with some slight skin maceration.  Please see above noted photo  Radiographic exam RT foot 02/07/2022 Cavus foot type noted.  Normal osseous mineralization.  No significant change since last x-rays taken.  There continues to be some osseous erosion and cortical irregularity to the cuboid and fifth metatarsal tubercle.  Mostly unchanged from x-rays on 11/08/2021.  No acute fractures identified  Assessment: 1.  Diabetic foot ulcers x2 right foot 2.  Diabetes mellitus type 2; uncontrolled; with peripheral polyneuropathy.  Last A1c 11/09/2021 was 8.9   Plan of Care:  1. Patient was evaluated.  Medically necessary excisional debridement including muscle and deep fascial tissue was performed using a tissue nipper.  Excisional debridement of all necrotic nonviable tissue down to healthier bleeding viable tissue was performed with postdebridement measurement same as pre- 2.  Continue Betadine and dry sterile dressings 3.  Continue Defender Walking boot 4.  There continues to be steady improvement.  We will continue Betadine wet-to-dry dressings every other day 5.  Unfortunately  the x-ray machine was down today.  X-rays next visit  Edrick Kins, DPM [facility]  Dr. Edrick Kins, DPM    [address], [place] [address]                Office [phone]  Fax [phone]

## 2022-05-24 ENCOUNTER — Other Ambulatory Visit: Payer: Self-pay | Admitting: Family Medicine

## 2022-05-24 NOTE — Telephone Encounter (Signed)
Requested Prescriptions  Pending Prescriptions Disp Refills  . amLODipine (NORVASC) 5 MG tablet [Pharmacy Med Name: AMLODIPINE BESYLATE '5MG'$  TABLETS] 90 tablet 0    Sig: TAKE 1 TABLET(5 MG) BY MOUTH DAILY     Cardiovascular: Calcium Channel Blockers 2 Failed - 05/24/2022  9:29 AM      Failed - Last BP in normal range    BP Readings from Last 1 Encounters:  02/14/22 (!) 184/112         Passed - Last Heart Rate in normal range    Pulse Readings from Last 1 Encounters:  02/17/22 80         Passed - Valid encounter within last 6 months    Recent Outpatient Visits          3 months ago Primary hypertension   [place], DO   5 months ago Type 2 diabetes with complication [facility])   [facility] [name], MD   6 months ago Type 2 diabetes with complication [facility])   [facility] [name], MD   1 year ago Vitamin D deficiency   [facility] [name], MD   1 year ago Type 2 diabetes with complication [facility])   [facility] [name], MD      Future Appointments            In 1 week [name], MD [facility], [facility]

## 2022-05-25 LAB — TESTOSTERONE

## 2022-06-05 ENCOUNTER — Encounter: Payer: Self-pay | Admitting: Family Medicine

## 2022-06-05 ENCOUNTER — Telehealth: Payer: Self-pay

## 2022-06-05 ENCOUNTER — Ambulatory Visit (INDEPENDENT_AMBULATORY_CARE_PROVIDER_SITE_OTHER): Payer: Managed Care, Other (non HMO) | Admitting: Family Medicine

## 2022-06-05 VITALS — BP 144/88 | HR 57 | Resp 16 | Ht 71.0 in | Wt 203.0 lb

## 2022-06-05 DIAGNOSIS — E113593 Type 2 diabetes mellitus with proliferative diabetic retinopathy without macular edema, bilateral: Secondary | ICD-10-CM | POA: Insufficient documentation

## 2022-06-05 DIAGNOSIS — E1169 Type 2 diabetes mellitus with other specified complication: Secondary | ICD-10-CM | POA: Diagnosis not present

## 2022-06-05 DIAGNOSIS — N2581 Secondary hyperparathyroidism of renal origin: Secondary | ICD-10-CM

## 2022-06-05 DIAGNOSIS — R809 Proteinuria, unspecified: Secondary | ICD-10-CM

## 2022-06-05 DIAGNOSIS — E118 Type 2 diabetes mellitus with unspecified complications: Secondary | ICD-10-CM | POA: Diagnosis not present

## 2022-06-05 DIAGNOSIS — I10 Essential (primary) hypertension: Secondary | ICD-10-CM

## 2022-06-05 DIAGNOSIS — E349 Endocrine disorder, unspecified: Secondary | ICD-10-CM

## 2022-06-05 DIAGNOSIS — Z1211 Encounter for screening for malignant neoplasm of colon: Secondary | ICD-10-CM

## 2022-06-05 DIAGNOSIS — E785 Hyperlipidemia, unspecified: Secondary | ICD-10-CM

## 2022-06-05 DIAGNOSIS — E1129 Type 2 diabetes mellitus with other diabetic kidney complication: Secondary | ICD-10-CM | POA: Diagnosis not present

## 2022-06-05 DIAGNOSIS — I739 Peripheral vascular disease, unspecified: Secondary | ICD-10-CM

## 2022-06-05 LAB — POCT GLYCOSYLATED HEMOGLOBIN (HGB A1C): Hemoglobin A1C: 8.4 % — AB (ref 4.0–5.6)

## 2022-06-05 MED ORDER — ROSUVASTATIN CALCIUM 40 MG PO TABS: ORAL_TABLET | ORAL | 1 refills | Status: DC

## 2022-06-05 MED ORDER — RYBELSUS 14 MG PO TABS: 14.0000 mg | ORAL_TABLET | Freq: Every day | ORAL | Status: DC

## 2022-06-05 MED ORDER — AMLODIPINE BESYLATE 5 MG PO TABS: 5.0000 mg | ORAL_TABLET | Freq: Two times a day (BID) | ORAL | 0 refills | Status: DC

## 2022-06-05 MED ORDER — VALSARTAN-HYDROCHLOROTHIAZIDE 320-25 MG PO TABS: 1.0000 | ORAL_TABLET | Freq: Every day | ORAL | 1 refills | Status: DC

## 2022-06-05 MED ORDER — TADALAFIL 20 MG PO TABS: 20.0000 mg | ORAL_TABLET | Freq: Every day | ORAL | 6 refills | Status: DC | PRN

## 2022-06-06 ENCOUNTER — Telehealth: Payer: Self-pay | Admitting: Family Medicine

## 2022-06-06 NOTE — Telephone Encounter (Signed)
Called patient and let him know per [place] no samples

## 2022-06-06 NOTE — Telephone Encounter (Signed)
Copied from [place] [id number]. Topic: General - Other >> Jun 06, 2022  8:58 AM [place] B wrote: Reason for CRM: Patient called in asking if he can get samples of Rybelsus '14mg'$ . Please call back

## 2022-06-06 NOTE — Telephone Encounter (Signed)
Script sent per [employer]. Patient notified testosterone lab from 2 weeks ago was cancelled per the lab due to insufficient amount of blood collected. Patient needs to have lab redrawn. Order placed and patient scheduled for tomorrow. Patient verbalized understanding

## 2022-06-07 ENCOUNTER — Other Ambulatory Visit: Payer: Managed Care, Other (non HMO)

## 2022-06-07 DIAGNOSIS — E349 Endocrine disorder, unspecified: Secondary | ICD-10-CM

## 2022-06-08 ENCOUNTER — Telehealth: Payer: Self-pay

## 2022-06-08 ENCOUNTER — Other Ambulatory Visit: Payer: Self-pay

## 2022-06-08 DIAGNOSIS — Z1211 Encounter for screening for malignant neoplasm of colon: Secondary | ICD-10-CM

## 2022-06-08 LAB — TESTOSTERONE: Testosterone: 372 ng/dL (ref 264–916)

## 2022-06-08 MED ORDER — NA SULFATE-K SULFATE-MG SULF 17.5-3.13-1.6 GM/177ML PO SOLN: 1.0000 | Freq: Once | ORAL | 0 refills | Status: AC

## 2022-06-08 NOTE — Telephone Encounter (Signed)
Called [facility] and spoke with Venora Maples and added on a free and total ratio , will fax form to sign for add on test

## 2022-06-08 NOTE — Progress Notes (Unsigned)
Gastroenterology Pre-Procedure Review  Request Date: 06/22/2022 Requesting Physician: Dr. Vicente Males  PATIENT REVIEW QUESTIONS: The patient responded to the following health history questions as indicated:    1. Are you having any GI issues? no 2. Do you have a personal history of Polyps? no 3. Do you have a family history of Colon Cancer or Polyps? no 4. Diabetes Mellitus? yes (type 2) 5. Joint replacements in the past 12 months?no 6. Major health problems in the past 3 months?no 7. Any artificial heart valves, MVP, or defibrillator?no    MEDICATIONS & ALLERGIES:    Patient reports the following regarding taking any anticoagulation/antiplatelet therapy:   Plavix, Coumadin, Eliquis, Xarelto, Lovenox, Pradaxa, Brilinta, or Effient? no Aspirin? no  Patient confirms/reports the following medications:  Current Outpatient Medications  Medication Sig Dispense Refill   ACCU-CHEK GUIDE test strip USE TO TEST BLOOD SUGAR UP TO FOUR TIMES DAILY AS DIRECTED 300 strip 1   Accu-Chek Softclix Lancets lancets USE TO TEST BLOOD SUGAR UP TO FOUR TIMES DAILY AS DIRECTED 300 each 1   amLODipine (NORVASC) 5 MG tablet Take 1 tablet (5 mg total) by mouth 2 (two) times daily. 180 tablet 0   Blood Glucose Calibration (CONTOUR NEXT CONTROL) Normal SOLN 1 Bottle by In Vitro route every 30 (thirty) days. 1 each 3   blood glucose meter kit and supplies 1 each by Other route in the morning, at noon, and at bedtime. Dispense based on patient and insurance preference. Use up to four times daily as directed. (FOR ICD-10 E10.9, E11.9). 1 each 0   Cyanocobalamin (B-12) 1000 MCG SUBL Place 1 tablet under the tongue daily. 100 tablet 1   empagliflozin (JARDIANCE) 25 MG TABS tablet TAKE 1 TABLET(25 MG) BY MOUTH DAILY 90 tablet 1   glipiZIDE (GLUCOTROL XL) 5 MG 24 hr tablet Take 1 tablet (5 mg total) by mouth daily with breakfast. 90 tablet 1   rosuvastatin (CRESTOR) 40 MG tablet TAKE 1 TABLET(40 MG) BY MOUTH DAILY 90 tablet 1    Semaglutide (RYBELSUS) 14 MG TABS Take 1 tablet (14 mg total) by mouth daily. 90 tablet 01   tadalafil (CIALIS) 20 MG tablet Take 1 tablet (20 mg total) by mouth daily as needed for erectile dysfunction. 30 tablet 6   torsemide (DEMADEX) 10 MG tablet Take 10 mg by mouth daily.     valsartan-hydrochlorothiazide (DIOVAN-HCT) 320-25 MG tablet Take 1 tablet by mouth daily. 90 tablet 1   No current facility-administered medications for this visit.    Patient confirms/reports the following allergies:  No Known Allergies  No orders of the defined types were placed in this encounter.   AUTHORIZATION INFORMATION Primary Insurance: 1D#: Group #:  Secondary Insurance: 1D#: Group #:  SCHEDULE INFORMATION: Date: 06/22/2022 Time: Location:[facility]

## 2022-06-08 NOTE — Telephone Encounter (Signed)
-----   Message from Nori Riis, PA-C sent at 06/08/2022  7:26 AM EDT ----- Regarding: FW: Can we add a free and total testosterone to this? ----- Message ----- From: Interface, [facility] Lab Results In Sent: 06/08/2022   5:37 AM EDT To: Nori Riis, PA-C

## 2022-06-12 LAB — SPECIMEN STATUS REPORT

## 2022-06-12 LAB — TESTOSTERONE,FREE AND TOTAL
Testosterone, Free: 3.5 pg/mL — ABNORMAL LOW (ref 7.2–24.0)
Testosterone: 347 ng/dL (ref 264–916)

## 2022-06-14 ENCOUNTER — Telehealth: Payer: Self-pay

## 2022-06-14 NOTE — Telephone Encounter (Signed)
Patient notified he states he is not interested in treatment at this time. He states he will just continue with diet and exercise.

## 2022-06-14 NOTE — Telephone Encounter (Signed)
-----   Message from Nori Riis, PA-C sent at 06/13/2022  6:17 AM EDT ----- Please let [name] know that his free testosterone is low, indicating that he has issues with testosterone deficiency.  His testosterone level is within the normal testosterone range of the lab, so he may have to pay out of pocket for testosterone replacement therapy as insurance typically won't cover TRT with normal levels.  If he is interested in starting therapy, we can schedule an office visit to discuss further.

## 2022-06-21 ENCOUNTER — Encounter: Payer: Self-pay | Admitting: Gastroenterology

## 2022-06-22 ENCOUNTER — Ambulatory Visit: Payer: Managed Care, Other (non HMO) | Admitting: General Practice

## 2022-06-22 ENCOUNTER — Ambulatory Visit
Admission: RE | Admit: 2022-06-22 | Discharge: 2022-06-22 | Disposition: A | Discharge status: Home / Self Care | Payer: Managed Care, Other (non HMO) | Attending: Gastroenterology | Admitting: Gastroenterology

## 2022-06-22 ENCOUNTER — Encounter
Admission: RE | Disposition: A | Discharge status: Home / Self Care | Payer: Self-pay | Source: Home / Self Care | Attending: Gastroenterology

## 2022-06-22 DIAGNOSIS — D126 Benign neoplasm of colon, unspecified: Secondary | ICD-10-CM

## 2022-06-22 DIAGNOSIS — K5140 Inflammatory polyps of colon without complications: Secondary | ICD-10-CM | POA: Insufficient documentation

## 2022-06-22 DIAGNOSIS — Z1211 Encounter for screening for malignant neoplasm of colon: Secondary | ICD-10-CM | POA: Insufficient documentation

## 2022-06-22 HISTORY — PX: COLONOSCOPY WITH PROPOFOL: SHX5780

## 2022-06-22 LAB — GLUCOSE, CAPILLARY: Glucose-Capillary: 152 mg/dL — ABNORMAL HIGH (ref 70–99)

## 2022-06-22 SURGERY — COLONOSCOPY WITH PROPOFOL
Anesthesia: General

## 2022-06-22 MED ORDER — LIDOCAINE HCL (CARDIAC) PF 100 MG/5ML IV SOSY
PREFILLED_SYRINGE | INTRAVENOUS | Status: DC | PRN
  Administered 2022-06-22: 50 mg via INTRAVENOUS

## 2022-06-22 MED ORDER — MIDAZOLAM HCL 2 MG/2ML IJ SOLN
INTRAMUSCULAR | Status: AC
  Filled 2022-06-22: qty 2

## 2022-06-22 MED ORDER — FENTANYL CITRATE (PF) 100 MCG/2ML IJ SOLN
INTRAMUSCULAR | Status: AC
  Filled 2022-06-22: qty 2

## 2022-06-22 MED ORDER — PROPOFOL 1000 MG/100ML IV EMUL
INTRAVENOUS | Status: AC
  Filled 2022-06-22: qty 100

## 2022-06-22 MED ORDER — MIDAZOLAM HCL 2 MG/2ML IJ SOLN
INTRAMUSCULAR | Status: DC | PRN
  Administered 2022-06-22: 2 mg via INTRAVENOUS

## 2022-06-22 MED ORDER — PROPOFOL 10 MG/ML IV BOLUS
INTRAVENOUS | Status: DC | PRN
  Administered 2022-06-22: 10 mg via INTRAVENOUS
  Administered 2022-06-22: 40 mg via INTRAVENOUS

## 2022-06-22 MED ORDER — GLYCOPYRROLATE 0.2 MG/ML IJ SOLN
INTRAMUSCULAR | Status: DC | PRN
  Administered 2022-06-22: .2 mg via INTRAVENOUS

## 2022-06-22 MED ORDER — PROPOFOL 500 MG/50ML IV EMUL
INTRAVENOUS | Status: DC | PRN
  Administered 2022-06-22: 50 ug/kg/min via INTRAVENOUS

## 2022-06-22 MED ORDER — SODIUM CHLORIDE 0.9 % IV SOLN: INTRAVENOUS | Status: DC

## 2022-06-22 NOTE — Op Note (Signed)
[facility] Gastroenterology Patient Name: [name] Procedure Date: 06/22/2022 10:03 AM MRN: [id number] Account #: [id number] Date of Birth: [birth year] Admit Type: Outpatient Age: 56 Room: [facility] ENDO ROOM 3 Gender: Male Note Status: Finalized Instrument Name: Park Meo [id number] Procedure:             Colonoscopy Indications:           Screening for colorectal malignant neoplasm Providers:             [name] MD, MD Referring MD:          Bethena Roys. Sowles, MD (Referring MD) Medicines:             Monitored Anesthesia Care Complications:         No immediate complications. Procedure:             Pre-Anesthesia Assessment:                        - Prior to the procedure, a History and Physical was                         performed, and patient medications, allergies and                         sensitivities were reviewed. The patient's tolerance                         of previous anesthesia was reviewed.                        - The risks and benefits of the procedure and the                         sedation options and risks were discussed with the                         patient. All questions were answered and informed                         consent was obtained.                        - ASA Grade Assessment: II - A patient with mild                         systemic disease.                        After obtaining informed consent, the colonoscope was                         passed under direct vision. Throughout the procedure,                         the patient's blood pressure, pulse, and oxygen                         saturations were monitored continuously. The                         Colonoscope was  introduced through the anus and                         advanced to the the cecum, identified by the                         appendiceal orifice. The colonoscopy was performed                         with ease. The patient tolerated the procedure well.                          The quality of the bowel preparation was excellent. Findings:      The perianal and digital rectal examinations were normal.      A 10 mm polyp was found in the proximal ascending colon. The polyp was       sessile. The polyp was removed with a cold snare. Resection and       retrieval were complete.      A 10 mm polyp was found in the distal ascending colon. The polyp was       pedunculated. The polyp was removed with a hot snare. Resection and       retrieval were complete.      The exam was otherwise without abnormality on direct and retroflexion       views. Impression:            - One 10 mm polyp in the proximal ascending colon,                         removed with a cold snare. Resected and retrieved.                        - One 10 mm polyp in the distal ascending colon,                         removed with a hot snare. Resected and retrieved.                        - The examination was otherwise normal on direct and                         retroflexion views. Recommendation:        - Discharge patient to home (with escort).                        - Resume previous diet.                        - Continue present medications.                        - Await pathology results.                        - Repeat colonoscopy in 3 years for surveillance. Procedure Code(s):     --- Professional ---                        [phone], Colonoscopy, flexible; with removal of  tumor(s), polyp(s), or other lesion(s) by snare                         technique Diagnosis Code(s):     --- Professional ---                        Z12.11, Encounter for screening for malignant neoplasm                         of colon                        K63.5, Polyp of colon CPT copyright 2019 American Medical Association. All rights reserved. The codes documented in this report are preliminary and upon coder review may  be revised to meet current compliance  requirements. [name], MD [name] MD, MD 06/22/2022 10:36:32 AM This report has been signed electronically. Number of Addenda: 0 Note Initiated On: 06/22/2022 10:03 AM Scope Withdrawal Time: 0 hours 13 minutes 1 second  Total Procedure Duration: 0 hours 18 minutes 22 seconds  Estimated Blood Loss:  Estimated blood loss: none.      [facility]

## 2022-06-22 NOTE — Anesthesia Postprocedure Evaluation (Signed)
Anesthesia Post Note  Patient: [name]  Procedure(s) Performed: COLONOSCOPY WITH PROPOFOL  Patient location during evaluation: Endoscopy Anesthesia Type: General Level of consciousness: awake and alert Pain management: pain level controlled Vital Signs Assessment: post-procedure vital signs reviewed and stable Respiratory status: spontaneous breathing, nonlabored ventilation, respiratory function stable and patient connected to nasal cannula oxygen Cardiovascular status: blood pressure returned to baseline and stable Postop Assessment: no apparent nausea or vomiting Anesthetic complications: no   No notable events documented.   Last Vitals:  Vitals:   06/22/22 1035 06/22/22 1045  BP: 98/61 (!) 89/76  Pulse: 63   Resp: 18   Temp: (!) 36.1 C   SpO2: 100%     Last Pain:  Vitals:   06/22/22 1045  TempSrc:   PainSc: 0-No pain                 Dimas Millin

## 2022-06-22 NOTE — Anesthesia Preprocedure Evaluation (Signed)
Anesthesia Evaluation  Patient identified by MRN, date of birth, ID band Patient awake    Reviewed: Allergy & Precautions, NPO status , Patient's Chart, lab work & pertinent test results  Airway Mallampati: II  TM Distance: >3 FB Neck ROM: full    Dental  (+) Chipped   Pulmonary neg pulmonary ROS,    Pulmonary exam normal        Cardiovascular hypertension, + Peripheral Vascular Disease  Normal cardiovascular exam     Neuro/Psych negative neurological ROS  negative psych ROS   GI/Hepatic negative GI ROS, Neg liver ROS,   Endo/Other  negative endocrine ROSdiabetes  Renal/GU negative Renal ROS  negative genitourinary   Musculoskeletal   Abdominal   Peds  Hematology negative hematology ROS (+)   Anesthesia Other Findings Past Medical History: No date: Diabetes mellitus without complication (HCC) No date: Hyperlipidemia No date: Hypertension  Past Surgical History: 04/16/2021: AMPUTATION; Right     Comment:  Procedure: AMPUTATION  RIGHT 5TH RAY, INCISION AND               DRAINAGE;  Surgeon: Edrick Kins, DPM;  Location: [facility]               ORS;  Service: Podiatry;  Laterality: Right; 03/12/271: APPLICATION OF WOUND VAC; Right     Comment:  Procedure: APPLICATION OF WOUND VAC;  Surgeon: Edrick Kins, DPM;  Location: [facility] ORS;  Service: Podiatry;                Laterality: Right; 06/27/2020: BONE BIOPSY; Left     Comment:  Procedure: BONE BIOPSY;  Surgeon: Caroline More, DPM;                Location: [facility] ORS;  Service: Podiatry;  Laterality:               Left; 06/27/2020: INCISION AND DRAINAGE; Left     Comment:  Procedure: INCISION AND DRAINAGE;  Surgeon: Caroline More, DPM;  Location: [facility] ORS;  Service: Podiatry;                Laterality: Left; 04/21/2021: IRRIGATION AND DEBRIDEMENT FOOT; Right     Comment:  Procedure: IRRIGATION AND DEBRIDEMENT FOOT;  Surgeon:                Edrick Kins, DPM;  Location: [facility] ORS;  Service:               Podiatry;  Laterality: Right; 06/30/2020: LOWER EXTREMITY ANGIOGRAPHY; Left     Comment:  Procedure: Lower Extremity Angiography;  Surgeon: Algernon Huxley, MD;  Location: [place] CV LAB;  Service:               Cardiovascular;  Laterality: Left; 04/14/2021: LOWER EXTREMITY ANGIOGRAPHY; Right     Comment:  Procedure: Lower Extremity Angiography;  Surgeon: Algernon Huxley, MD;  Location: [place] CV LAB;  Service:               Cardiovascular;  Laterality: Right; No date: NO PAST SURGERIES 04/19/2021: WOUND DEBRIDEMENT; Right     Comment:  Procedure: DEBRIDEMENT WOUND;  Surgeon: Edrick Kins,  DPM;  Location: [facility] ORS;  Service: Podiatry;                Laterality: Right;  BMI    Body Mass Index: 28.31 kg/m      Reproductive/Obstetrics negative OB ROS                             Anesthesia Physical Anesthesia Plan  ASA: 2  Anesthesia Plan: General   Post-op Pain Management:    Induction: Intravenous  PONV Risk Score and Plan: Propofol infusion and TIVA  Airway Management Planned:   Additional Equipment:   Intra-op Plan:   Post-operative Plan:   Informed Consent: I have reviewed the patients History and Physical, chart, labs and discussed the procedure including the risks, benefits and alternatives for the proposed anesthesia with the patient or authorized representative who has indicated his/her understanding and acceptance.     Dental Advisory Given  Plan Discussed with: Anesthesiologist, CRNA and Surgeon  Anesthesia Plan Comments:         Anesthesia Quick Evaluation

## 2022-06-22 NOTE — Transfer of Care (Signed)
Immediate Anesthesia Transfer of Care Note  Patient: [name]  Procedure(s) Performed: COLONOSCOPY WITH PROPOFOL  Patient Location: PACU  Anesthesia Type:General  Level of Consciousness: sedated  Airway & Oxygen Therapy: Patient Spontanous Breathing and Patient connected to nasal cannula oxygen  Post-op Assessment: Report given to RN and Post -op Vital signs reviewed and stable  Post vital signs: Reviewed and stable  Last Vitals:  Vitals Value Taken Time  BP 98/61 06/22/22 1036  Temp 36.1 C 06/22/22 1035  Pulse 60 06/22/22 1036  Resp 17 06/22/22 1036  SpO2 100 % 06/22/22 1036  Vitals shown include unvalidated device data.  Last Pain:  Vitals:   06/22/22 1035  TempSrc: Temporal  PainSc:          Complications: No notable events documented.

## 2022-06-22 NOTE — H&P (Signed)
Zachary Bellows, MD [address] [place], [place], [place], [address] [address], [place], [place], [place], [address] Phone: [phone]  Fax: [phone]  Primary Care Physician:  [name], MD   Pre-Procedure History & Physical: HPI:  [name] is a 56 y.o. male is here for an colonoscopy.   Past Medical History:  Diagnosis Date   Diabetes mellitus without complication ([place])    Hyperlipidemia    Hypertension     Past Surgical History:  Procedure Laterality Date   AMPUTATION Right 04/16/2021   Procedure: AMPUTATION  RIGHT 5TH RAY, INCISION AND DRAINAGE;  Surgeon: Edrick Kins, DPM;  Location: [facility] ORS;  Service: Podiatry;  Laterality: Right;   APPLICATION OF WOUND VAC Right 04/21/2021   Procedure: APPLICATION OF WOUND VAC;  Surgeon: Edrick Kins, DPM;  Location: [facility] ORS;  Service: Podiatry;  Laterality: Right;   BONE BIOPSY Left 06/27/2020   Procedure: BONE BIOPSY;  Surgeon: Caroline More, DPM;  Location: [facility] ORS;  Service: Podiatry;  Laterality: Left;   INCISION AND DRAINAGE Left 06/27/2020   Procedure: INCISION AND DRAINAGE;  Surgeon: Caroline More, DPM;  Location: [facility] ORS;  Service: Podiatry;  Laterality: Left;   IRRIGATION AND DEBRIDEMENT FOOT Right 04/21/2021   Procedure: IRRIGATION AND DEBRIDEMENT FOOT;  Surgeon: Edrick Kins, DPM;  Location: [facility] ORS;  Service: Podiatry;  Laterality: Right;   LOWER EXTREMITY ANGIOGRAPHY Left 06/30/2020   Procedure: Lower Extremity Angiography;  Surgeon: Algernon Huxley, MD;  Location: [place] CV LAB;  Service: Cardiovascular;  Laterality: Left;   LOWER EXTREMITY ANGIOGRAPHY Right 04/14/2021   Procedure: Lower Extremity Angiography;  Surgeon: Algernon Huxley, MD;  Location: [place] CV LAB;  Service: Cardiovascular;  Laterality: Right;   NO PAST SURGERIES     WOUND DEBRIDEMENT Right 04/19/2021   Procedure: DEBRIDEMENT WOUND;  Surgeon: Edrick Kins, DPM;  Location: [facility] ORS;  Service: Podiatry;  Laterality:  Right;    Prior to Admission medications   Medication Sig Start Date End Date Taking? Authorizing Provider  amLODipine (NORVASC) 5 MG tablet Take 1 tablet (5 mg total) by mouth 2 (two) times daily. 06/05/22  Yes Sowles, Drue Stager, MD  empagliflozin (JARDIANCE) 25 MG TABS tablet TAKE 1 TABLET(25 MG) BY MOUTH DAILY 02/24/22  Yes Sowles, Drue Stager, MD  glipiZIDE (GLUCOTROL XL) 5 MG 24 hr tablet Take 1 tablet (5 mg total) by mouth daily with breakfast. 02/24/22  Yes Sowles, Drue Stager, MD  Semaglutide (RYBELSUS) 14 MG TABS Take 1 tablet (14 mg total) by mouth daily. 06/05/22  Yes Sowles, Drue Stager, MD  valsartan-hydrochlorothiazide (DIOVAN-HCT) 320-25 MG tablet Take 1 tablet by mouth daily. 06/05/22  Yes Sowles, Drue Stager, MD  ACCU-CHEK GUIDE test strip USE TO TEST BLOOD SUGAR UP TO FOUR TIMES DAILY AS DIRECTED 06/21/21   [name], MD  Accu-Chek Softclix Lancets lancets USE TO TEST BLOOD SUGAR UP TO FOUR TIMES DAILY AS DIRECTED 06/21/21   Ancil Boozer, Drue Stager, MD  Blood Glucose Calibration (CONTOUR NEXT CONTROL) Normal SOLN 1 Bottle by In Vitro route every 30 (thirty) days. 05/02/21   [name], MD  blood glucose meter kit and supplies 1 each by Other route in the morning, at noon, and at bedtime. Dispense based on patient and insurance preference. Use up to four times daily as directed. (FOR ICD-10 E10.9, E11.9). 04/28/21   [name], MD  Cyanocobalamin (B-12) 1000 MCG SUBL Place 1 tablet under the tongue daily. 04/28/21   [name], MD  rosuvastatin (  CRESTOR) 40 MG tablet TAKE 1 TABLET(40 MG) BY MOUTH DAILY 06/05/22   [name], MD  tadalafil (CIALIS) 20 MG tablet Take 1 tablet (20 mg total) by mouth daily as needed for erectile dysfunction. 06/05/22   Zara Council A, PA-C  torsemide (DEMADEX) 10 MG tablet Take 10 mg by mouth daily.    Murlean Iba, MD    Allergies as of 06/08/2022   (No Known Allergies)    Family History  Problem Relation Age of Onset   Heart disease Father     Heart attack Father    Healthy Mother    Prostate cancer Neg Hx    Kidney cancer Neg Hx    Bladder Cancer Neg Hx     Social History   Socioeconomic History   Marital status: Married    Spouse name: [name]    Number of children: 1   Years of education: Not on file   Highest education level: Not on file  Occupational History   Not on file  Tobacco Use   Smoking status: Never   Smokeless tobacco: Never  Vaping Use   Vaping Use: Never used  Substance and Sexual Activity   Alcohol use: Yes    Alcohol/week: 6.0 - 12.0 standard drinks of alcohol    Types: 6 - 12 Cans of beer per week    Comment: Occasional   Drug use: No   Sexual activity: Yes  Other Topics Concern   Not on file  Social History Narrative   Not on file   Social Determinants of Health   Financial Resource Strain: Low Risk  (07/02/2019)   Overall Financial Resource Strain (CARDIA)    Difficulty of Paying Living Expenses: Not hard at all  Food Insecurity: No Food Insecurity (07/02/2019)   Hunger Vital Sign    Worried About Running Out of Food in the Last Year: Never true    Ran Out of Food in the Last Year: Never true  Transportation Needs: No Transportation Needs (07/02/2019)   PRAPARE - Hydrologist (Medical): No    Lack of Transportation (Non-Medical): No  Physical Activity: Insufficiently Active (07/02/2019)   Exercise Vital Sign    Days of Exercise per Week: 4 days    Minutes of Exercise per Session: 30 min  Stress: No Stress Concern Present (07/02/2019)   [place]    Feeling of Stress : Not at all  Social Connections: Not on file  Intimate Partner Violence: Not At Risk (07/02/2019)   Humiliation, Afraid, Rape, and Kick questionnaire    Fear of Current or Ex-Partner: No    Emotionally Abused: No    Physically Abused: No    Sexually Abused: No    Review of Systems: See HPI, otherwise negative  ROS  Physical Exam: BP 124/86   Pulse 67   Temp (!) 97 F (36.1 C) (Temporal)   Resp 16   Wt 92.1 kg   SpO2 100%   BMI 28.31 kg/m  General:   Alert,  pleasant and cooperative in NAD Head:  Normocephalic and atraumatic. Neck:  Supple; no masses or thyromegaly. Lungs:  Clear throughout to auscultation, normal respiratory effort.    Heart:  +S1, +S2, Regular rate and rhythm, No edema. Abdomen:  Soft, nontender and nondistended. Normal bowel sounds, without guarding, and without rebound.   Neurologic:  Alert and  oriented x4;  grossly normal neurologically.  Impression/Plan: [name] is here  for an colonoscopy to be performed for Screening colonoscopy average risk   Risks, benefits, limitations, and alternatives regarding  colonoscopy have been reviewed with the patient.  Questions have been answered.  All parties agreeable.   Zachary Bellows, MD  06/22/2022, 10:04 AM

## 2022-06-23 ENCOUNTER — Ambulatory Visit: Payer: Managed Care, Other (non HMO) | Admitting: Podiatry

## 2022-06-23 ENCOUNTER — Encounter: Payer: Self-pay | Admitting: Gastroenterology

## 2022-06-23 DIAGNOSIS — L97512 Non-pressure chronic ulcer of other part of right foot with fat layer exposed: Secondary | ICD-10-CM | POA: Diagnosis not present

## 2022-06-23 LAB — SURGICAL PATHOLOGY

## 2022-06-26 ENCOUNTER — Encounter: Payer: Self-pay | Admitting: Gastroenterology

## 2022-06-30 ENCOUNTER — Telehealth: Payer: Self-pay | Admitting: Family Medicine

## 2022-06-30 DIAGNOSIS — E559 Vitamin D deficiency, unspecified: Secondary | ICD-10-CM

## 2022-06-30 NOTE — Telephone Encounter (Signed)
Pts Vitamin D RX was denied / pt states he was never advised that he shouldn't be taking this anymore/ please call pt and advise about vitamin D

## 2022-07-03 NOTE — Telephone Encounter (Signed)
Patient notified

## 2022-07-05 MED ORDER — SANTYL 250 UNIT/GM EX OINT: 1.0000 | TOPICAL_OINTMENT | Freq: Every day | CUTANEOUS | 0 refills | Status: DC

## 2022-07-05 NOTE — Progress Notes (Signed)
Subjective:  Patient presents today for follow-up evaluation of an ulcer to the plantar aspect of the right foot.  Patient continues to do well.  They have been changing the dressings and applying collagen daily to the wound.  He is also wearing the Defender offloading cam boot.  No new complaints at this time  Past Medical History:  Diagnosis Date   Diabetes mellitus without complication ([place])    Hyperlipidemia    Hypertension    Past Surgical History:  Procedure Laterality Date   AMPUTATION Right 04/16/2021   Procedure: AMPUTATION  RIGHT 5TH RAY, INCISION AND DRAINAGE;  Surgeon: Edrick Kins, DPM;  Location: [facility] ORS;  Service: Podiatry;  Laterality: Right;   APPLICATION OF WOUND VAC Right 04/21/2021   Procedure: APPLICATION OF WOUND VAC;  Surgeon: Edrick Kins, DPM;  Location: [facility] ORS;  Service: Podiatry;  Laterality: Right;   BONE BIOPSY Left 06/27/2020   Procedure: BONE BIOPSY;  Surgeon: Caroline More, DPM;  Location: [facility] ORS;  Service: Podiatry;  Laterality: Left;   COLONOSCOPY WITH PROPOFOL N/A 06/22/2022   Procedure: COLONOSCOPY WITH PROPOFOL;  Surgeon: [name], MD;  Location: [facility] ENDOSCOPY;  Service: Gastroenterology;  Laterality: N/A;   INCISION AND DRAINAGE Left 06/27/2020   Procedure: INCISION AND DRAINAGE;  Surgeon: Caroline More, DPM;  Location: [facility] ORS;  Service: Podiatry;  Laterality: Left;   IRRIGATION AND DEBRIDEMENT FOOT Right 04/21/2021   Procedure: IRRIGATION AND DEBRIDEMENT FOOT;  Surgeon: Edrick Kins, DPM;  Location: [facility] ORS;  Service: Podiatry;  Laterality: Right;   LOWER EXTREMITY ANGIOGRAPHY Left 06/30/2020   Procedure: Lower Extremity Angiography;  Surgeon: Algernon Huxley, MD;  Location: [place] [facility];  Service: Cardiovascular;  Laterality: Left;   LOWER EXTREMITY ANGIOGRAPHY Right 04/14/2021   Procedure: Lower Extremity Angiography;  Surgeon: Algernon Huxley, MD;  Location: [place] [facility];  Service: Cardiovascular;  Laterality: Right;   NO PAST  SURGERIES     WOUND DEBRIDEMENT Right 04/19/2021   Procedure: DEBRIDEMENT WOUND;  Surgeon: Edrick Kins, DPM;  Location: [facility] ORS;  Service: Podiatry;  Laterality: Right;   No Known Allergies     Objective/Physical Exam Wound noted to the plantar aspect of the right foot measuring approximately 3.0x3.5 x 0.3 cm appears somewhat stable.  Currently no exposed bone muscle tendon ligament or joint.  Granular wound base.  Moderate serosanguineous drainage.  Please see above noted photo  Radiographic exam RT foot 02/07/2022 Cavus foot type noted.  Normal osseous mineralization.  No significant change since last x-rays taken.  There continues to be some osseous erosion and cortical irregularity to the cuboid and fifth metatarsal tubercle.  Mostly unchanged from x-rays on 11/08/2021.  No acute fractures identified  Assessment: 1.  Diabetic foot ulcers x2 right foot 2.  Diabetes mellitus type 2; uncontrolled; with peripheral polyneuropathy.  Last A1c 06/05/2022 was 8.4   Plan of Care:  1. Patient was evaluated.  Medically necessary excisional debridement including muscle and deep fascial tissue was performed using a tissue nipper.  Excisional debridement of all necrotic nonviable tissue down to healthier bleeding viable tissue was performed with postdebridement measurement same as pre- 2.  Today we are going to modify or change the treatment regimen to Santyl collagenase ointment.  Prescription for Santyl sent to the pharmacy 3.  Continue Defender Walking boot 4.  Order placed to  [facility] for additional wound care supplies  5.  Return to clinic 4 weeks for follow-up x-rays  Dorathy Daft.  Amalia Hailey, DPM [facility]  Dr. Edrick Kins, DPM    [address], [place] [address]                Office [phone]  Fax [phone]

## 2022-08-04 ENCOUNTER — Ambulatory Visit: Payer: Managed Care, Other (non HMO) | Admitting: Podiatry

## 2022-08-25 ENCOUNTER — Other Ambulatory Visit: Payer: Self-pay | Admitting: Family Medicine

## 2022-08-25 DIAGNOSIS — E118 Type 2 diabetes mellitus with unspecified complications: Secondary | ICD-10-CM

## 2022-08-25 DIAGNOSIS — E669 Obesity, unspecified: Secondary | ICD-10-CM

## 2022-08-25 DIAGNOSIS — E1169 Type 2 diabetes mellitus with other specified complication: Secondary | ICD-10-CM

## 2022-08-26 ENCOUNTER — Other Ambulatory Visit: Payer: Self-pay | Admitting: Family Medicine

## 2022-08-26 DIAGNOSIS — E1169 Type 2 diabetes mellitus with other specified complication: Secondary | ICD-10-CM

## 2022-08-26 DIAGNOSIS — E118 Type 2 diabetes mellitus with unspecified complications: Secondary | ICD-10-CM

## 2022-08-28 ENCOUNTER — Telehealth: Payer: Self-pay | Admitting: Family Medicine

## 2022-08-28 NOTE — Telephone Encounter (Signed)
Tried calling patient per Dr. Ancil Boozer but call would not go through, to see if he had enough Jardiance '25mg'$  until his appointment with her on 09/05/2022 @ 3pm due to provider receiving a refill request via fax from [place].  Per Dr. Ancil Boozer patient will need to be seen sooner if he doesn't have enough medication to last until appt.

## 2022-09-04 LAB — HM DIABETES EYE EXAM

## 2022-09-04 NOTE — Progress Notes (Unsigned)
Name: [name]   MRN: [id number]    DOB: 1965-12-14   Date:09/05/2022       Progress Note  Subjective  Chief Complaint  Follow Up  HPI  DMII: since last visit he has been compliant with Glipizide, Jardiance  and Rybelsus 14 mg , he states eating healthier and has been going to the gym, his A1C is down to 7.1 %  FSBS at home has been between 116/170's, not going above 200 like it did in the past. He has macro albuminuria and is under the care of Dr. Candiss Norse. He denies polyphagia, polydipsia or polyuria at this time. He is up to date with eye exam and has retinopathy, still has an ulcer on right heel and under the care of podiatrist, Dr. Amalia Hailey , he has PA and takes statin therapy.BP has improved but not at goal    HTN: bp is not at goal, he is now taking norvasc to 5 mg BID, continue valsartan hctz 320/25 and torsemide prn. He states bp early morning is usually in the 140's, discussed adding clonidine at night but he refused adding a new medication. He states bp oscillates throughout the day. He denies chest pain, palpitation , dizziness or sob  HE has CKI stage III , seeing nephrologist, has secondary hyperparathyroidism    Dyslipidemia: taking Crestor daily, Last LDL still not at goal but down from 162 to 79, we will recheck labs today   He has noticed ear fullness and difficulty hearing on left side for the past couple of months   Patient Active Problem List   Diagnosis Date Noted   Proliferative diabetic retinopathy of both eyes without macular edema associated with type 2 diabetes mellitus ([place]) 06/05/2022   PAD (peripheral artery disease) ([place]) 04/12/2021   Secondary renal hyperparathyroidism ([place]) 03/19/2019   Diabetic ulcer of foot with fat layer exposed ([place]) 12/18/2018   Type 2 diabetes mellitus ([place]) 12/18/2018   Dyslipidemia associated with type 2 diabetes mellitus ([place]) 07/03/2018   Vitamin D deficiency 04/11/2018   ED (erectile dysfunction) 04/10/2018   Lower urinary tract  symptoms (LUTS) 02/09/2017   Hypertension 07/05/2015   Hyperglycemia due to type 2 diabetes mellitus ([place]) 07/05/2015   HLD (hyperlipidemia) 07/05/2015    Past Surgical History:  Procedure Laterality Date   AMPUTATION Right 04/16/2021   Procedure: AMPUTATION  RIGHT 5TH RAY, INCISION AND DRAINAGE;  Surgeon: Edrick Kins, DPM;  Location: [facility]S;  Service: Podiatry;  Laterality: Right;   APPLICATION OF WOUND VAC Right 04/21/2021   Procedure: APPLICATION OF WOUND VAC;  Surgeon: Edrick Kins, DPM;  Location: [facility];  Service: Podiatry;  Laterality: Right;   BONE BIOPSY Left 06/27/2020   Procedure: BONE BIOPSY;  Surgeon: Caroline More, DPM;  Location: [facility];  Service: Podiatry;  Laterality: Left;   COLONOSCOPY WITH PROPOFOL N/A 06/22/2022   Procedure: COLONOSCOPY WITH PROPOFOL;  Surgeon: [name], MD;  Location: [facility];  Service: Gastroenterology;  Laterality: N/A;   INCISION AND DRAINAGE Left 06/27/2020   Procedure: INCISION AND DRAINAGE;  Surgeon: Caroline More, DPM;  Location: [facility];  Service: Podiatry;  Laterality: Left;   IRRIGATION AND DEBRIDEMENT FOOT Right 04/21/2021   Procedure: IRRIGATION AND DEBRIDEMENT FOOT;  Surgeon: Edrick Kins, DPM;  Location: [facility];  Service: Podiatry;  Laterality: Right;   LOWER EXTREMITY ANGIOGRAPHY Left 06/30/2020   Procedure: Lower Extremity Angiography;  Surgeon: Algernon Huxley, MD;  Location: [place] [facility];  Service: Cardiovascular;  Laterality:  Left;   LOWER EXTREMITY ANGIOGRAPHY Right 04/14/2021   Procedure: Lower Extremity Angiography;  Surgeon: Algernon Huxley, MD;  Location: [place] [facility];  Service: Cardiovascular;  Laterality: Right;   NO PAST SURGERIES     WOUND DEBRIDEMENT Right 04/19/2021   Procedure: DEBRIDEMENT WOUND;  Surgeon: Edrick Kins, DPM;  Location: [facility];  Service: Podiatry;  Laterality: Right;    Family History  Problem Relation Age of Onset   Heart disease Father    Heart attack Father    Healthy  Mother    Prostate cancer Neg Hx    Kidney cancer Neg Hx    Bladder Cancer Neg Hx     Social History   Tobacco Use   Smoking status: Never   Smokeless tobacco: Never  Substance Use Topics   Alcohol use: Yes    Alcohol/week: 6.0 - 12.0 standard drinks of alcohol    Types: 6 - 12 Cans of beer per week    Comment: Occasional     Current Outpatient Medications:    ACCU-CHEK GUIDE test strip, USE TO TEST BLOOD SUGAR UP TO FOUR TIMES DAILY AS DIRECTED, Disp: 300 strip, Rfl: 1   Accu-Chek Softclix Lancets lancets, USE TO TEST BLOOD SUGAR UP TO FOUR TIMES DAILY AS DIRECTED, Disp: 300 each, Rfl: 1   amLODipine (NORVASC) 5 MG tablet, Take 1 tablet (5 mg total) by mouth 2 (two) times daily., Disp: 180 tablet, Rfl: 0   Blood Glucose Calibration (CONTOUR NEXT CONTROL) Normal SOLN, 1 Bottle by In Vitro route every 30 (thirty) days., Disp: 1 each, Rfl: 3   blood glucose meter kit and supplies, 1 each by Other route in the morning, at noon, and at bedtime. Dispense based on patient and insurance preference. Use up to four times daily as directed. (FOR ICD-10 E10.9, E11.9)., Disp: 1 each, Rfl: 0   collagenase (SANTYL) 250 UNIT/GM ointment, Apply 1 Application topically daily., Disp: 15 g, Rfl: 0   Cyanocobalamin (B-12) 1000 MCG SUBL, Place 1 tablet under the tongue daily., Disp: 100 tablet, Rfl: 1   glipiZIDE (GLUCOTROL XL) 5 MG 24 hr tablet, TAKE 1 TABLET(5 MG) BY MOUTH DAILY WITH BREAKFAST, Disp: 90 tablet, Rfl: 1   JARDIANCE 25 MG TABS tablet, TAKE 1 TABLET(25 MG) BY MOUTH DAILY, Disp: 30 tablet, Rfl: 0   rosuvastatin (CRESTOR) 40 MG tablet, TAKE 1 TABLET(40 MG) BY MOUTH DAILY, Disp: 90 tablet, Rfl: 1   Semaglutide (RYBELSUS) 14 MG TABS, Take 1 tablet (14 mg total) by mouth daily., Disp: 90 tablet, Rfl: 01   tadalafil (CIALIS) 20 MG tablet, Take 1 tablet (20 mg total) by mouth daily as needed for erectile dysfunction., Disp: 30 tablet, Rfl: 6   torsemide (DEMADEX) 10 MG tablet, Take 10 mg by mouth  daily., Disp: , Rfl:    valsartan-hydrochlorothiazide (DIOVAN-HCT) 320-25 MG tablet, Take 1 tablet by mouth daily., Disp: 90 tablet, Rfl: 1  No Known Allergies  I personally reviewed active problem list, medication list, allergies, family history, social history, health maintenance with the patient/caregiver today.   ROS  Constitutional: Negative for fever or weight change.  Respiratory: Negative for cough and shortness of breath.   Cardiovascular: Negative for chest pain or palpitations.  Gastrointestinal: Negative for abdominal pain, no bowel changes.  Musculoskeletal:Positive  for gait problem or joint swelling.  Skin: Negative for rash.  Neurological: Negative for dizziness or headache.  No other specific complaints in a complete review of systems (except as listed in HPI  above).   Objective  Vitals:   09/05/22 1505 09/05/22 1518  BP: (!) 146/94 (!) 142/88  Pulse: 89   Resp: 16   SpO2: 99%   Weight: 192 lb (87.1 kg)   Height: '5\' 11"'  (1.803 m)     Body mass index is 26.78 kg/m.  Physical Exam  Constitutional: Patient appears well-developed and well-nourished.  No distress.  HEENT: head atraumatic, normocephalic, pupils equal and reactive to light, neck supple, cerumen impaction left ear  Cardiovascular: Normal rate, regular rhythm and normal heart sounds.  No murmur heard. No BLE edema. Pulmonary/Chest: Effort normal and breath sounds normal. No respiratory distress. Abdominal: Soft.  There is no tenderness. Psychiatric: Patient has a normal mood and affect. behavior is normal. Judgment and thought content normal.   Recent Results (from the past 2160 hour(s))  Glucose, capillary     Status: Abnormal   Collection Time: 06/22/22  9:36 AM  Result Value Ref Range   Glucose-Capillary 152 (H) 70 - 99 mg/dL    Comment: Glucose reference range applies only to samples taken after fasting for at least 8 hours.  Surgical pathology     Status: None   Collection Time: 06/22/22  10:22 AM  Result Value Ref Range   SURGICAL PATHOLOGY      SURGICAL PATHOLOGY CASE: [id number] PATIENT: [facility] Surgical Pathology Report     Specimen Submitted: A. Colon polyp x2, ascending; h snare (1) c snare (1)  Clinical History: Colon Cancer screening Z12.11. Colon polyps      DIAGNOSIS: A.  COLON POLYP X 2, ASCENDING; SNARE AND COLD SNARE: - HYPERPLASTIC POLYP. - INFLAMMATORY POLYP. - NEGATIVE FOR DYSPLASIA AND MALIGNANCY.  GROSS DESCRIPTION: A. Labeled: Ascending colon polyp cold snare x 1, polyp hot snare x 1 Received: Formalin Collection time: 10:22 AM on 06/22/2022 Placed into formalin time: 10:22 AM on 06/22/2022 Tissue fragment(s): Multiple Size: Aggregate, 1.8 x 0.6 x 0.1 cm Description: Tan soft tissue fragments Entirely submitted in 1 cassette.  CM 06/22/2022  Final Diagnosis performed by Quay Burow, MD.   Electronically signed 06/23/2022 10:11:45AM The electronic signature indicates that the named Attending Pathologist has evaluated the specimen Technical component performed at [facility], [address], [place], [place] [address] Lab: [phone] Dir: Rush Farmer, MD, MMM  Professional component performed at [facility], [facility], [place], [place], [place] [address] Lab: [phone] Dir: Kathi Simpers, MD   POCT HgB A1C     Status: Abnormal   Collection Time: 09/05/22  3:18 PM  Result Value Ref Range   Hemoglobin A1C 7.1 (A) 4.0 - 5.6 %   HbA1c POC (<> result, manual entry)     HbA1c, POC (prediabetic range)     HbA1c, POC (controlled diabetic range)      PHQ2/9:    09/05/2022    3:15 PM 06/05/2022    3:13 PM 02/14/2022   10:54 AM 12/21/2021    2:35 PM 11/09/2021    8:20 AM  Depression screen PHQ 2/9  Decreased Interest 0 0 0 0 0  Down, Depressed, Hopeless 0 0 0 0 0  PHQ - 2 Score 0 0 0 0 0  Altered sleeping 0  0 0 0  Tired, decreased energy 0  0 0 0  Change in appetite 0  0 0 0  Feeling bad or  failure about yourself  0  0 0 0  Trouble concentrating 0  0 0 0  Moving slowly or fidgety/restless 0  0  0 0  Suicidal thoughts 0  0 0 0  PHQ-9 Score 0  0 0 0  Difficult doing work/chores   Not difficult at all      phq 9 is negative   Fall Risk:    09/05/2022    3:15 PM 06/05/2022    3:13 PM 02/14/2022   10:54 AM 12/21/2021    2:35 PM 11/09/2021    8:20 AM  Wells in the past year? 0 0 0 0 0  Number falls in past yr: 0 0 0 0 0  Injury with Fall? 0 0 0 0 0  Risk for fall due to : No Fall Risks No Fall Risks No Fall Risks No Fall Risks No Fall Risks  Follow up Falls prevention discussed Falls prevention discussed Falls prevention discussed Falls prevention discussed Falls prevention discussed      Functional Status Survey: Is the patient deaf or have difficulty hearing?: No Does the patient have difficulty seeing, even when wearing glasses/contacts?: No Does the patient have difficulty concentrating, remembering, or making decisions?: No Does the patient have difficulty walking or climbing stairs?: No Does the patient have difficulty dressing or bathing?: No Does the patient have difficulty doing errands alone such as visiting a doctor's office or shopping?: No    Assessment & Plan  1. Type 2 diabetes with complication (HCC)  - POCT HgB A1C - empagliflozin (JARDIANCE) 25 MG TABS tablet; Take 1 tablet (25 mg total) by mouth daily.  Dispense: 90 tablet; Refill: 1  2. Proliferative diabetic retinopathy of both eyes without macular edema associated with type 2 diabetes mellitus ([place])   3. Type 2 diabetes mellitus with microalbuminuria, without long-term current use of insulin (HCC)  - COMPLETE METABOLIC PANEL WITH GFR - CBC with Differential/Platelet  4. Dyslipidemia associated with type 2 diabetes mellitus (HCC)  - empagliflozin (JARDIANCE) 25 MG TABS tablet; Take 1 tablet (25 mg total) by mouth daily.  Dispense: 90 tablet; Refill: 1 - Lipid panel  5. PAD  (peripheral artery disease) (HCC)  - Lipid panel  6. Secondary renal hyperparathyroidism ([place])   7. Vitamin D deficiency  - VITAMIN D 25 Hydroxy (Vit-D Deficiency, Fractures)  8. B12 deficiency  - B12 and Folate Panel   9. Hearing loss due to cerumen impaction, left  Verbal consent given Possible side effects discussed with patient Ears were  lavaged with warm water and peroxide  Patient tolerated procedure well No complications

## 2022-09-05 ENCOUNTER — Encounter: Payer: Self-pay | Admitting: Family Medicine

## 2022-09-05 ENCOUNTER — Ambulatory Visit (INDEPENDENT_AMBULATORY_CARE_PROVIDER_SITE_OTHER): Payer: Managed Care, Other (non HMO) | Admitting: Family Medicine

## 2022-09-05 VITALS — BP 142/88 | HR 89 | Resp 16 | Ht 71.0 in | Wt 192.0 lb

## 2022-09-05 DIAGNOSIS — I739 Peripheral vascular disease, unspecified: Secondary | ICD-10-CM | POA: Diagnosis not present

## 2022-09-05 DIAGNOSIS — E118 Type 2 diabetes mellitus with unspecified complications: Secondary | ICD-10-CM | POA: Diagnosis not present

## 2022-09-05 DIAGNOSIS — E538 Deficiency of other specified B group vitamins: Secondary | ICD-10-CM | POA: Insufficient documentation

## 2022-09-05 DIAGNOSIS — R809 Proteinuria, unspecified: Secondary | ICD-10-CM

## 2022-09-05 DIAGNOSIS — E785 Hyperlipidemia, unspecified: Secondary | ICD-10-CM | POA: Diagnosis not present

## 2022-09-05 DIAGNOSIS — H6122 Impacted cerumen, left ear: Secondary | ICD-10-CM | POA: Diagnosis not present

## 2022-09-05 DIAGNOSIS — E113593 Type 2 diabetes mellitus with proliferative diabetic retinopathy without macular edema, bilateral: Secondary | ICD-10-CM | POA: Diagnosis not present

## 2022-09-05 DIAGNOSIS — N2581 Secondary hyperparathyroidism of renal origin: Secondary | ICD-10-CM

## 2022-09-05 DIAGNOSIS — E1129 Type 2 diabetes mellitus with other diabetic kidney complication: Secondary | ICD-10-CM

## 2022-09-05 DIAGNOSIS — E559 Vitamin D deficiency, unspecified: Secondary | ICD-10-CM

## 2022-09-05 DIAGNOSIS — E1169 Type 2 diabetes mellitus with other specified complication: Secondary | ICD-10-CM

## 2022-09-05 LAB — POCT GLYCOSYLATED HEMOGLOBIN (HGB A1C): Hemoglobin A1C: 7.1 % — AB (ref 4.0–5.6)

## 2022-09-05 MED ORDER — EMPAGLIFLOZIN 25 MG PO TABS: 25.0000 mg | ORAL_TABLET | Freq: Every day | ORAL | 1 refills | Status: DC

## 2022-09-05 NOTE — Addendum Note (Signed)
Addended by: Carlene Coria on: 09/05/2022 03:48 PM   Modules accepted: Orders

## 2022-09-07 LAB — COMPREHENSIVE METABOLIC PANEL
ALT: 14 IU/L (ref 0–44)
AST: 13 IU/L (ref 0–40)
Albumin/Globulin Ratio: 1.1 — ABNORMAL LOW (ref 1.2–2.2)
Albumin: 3.8 g/dL (ref 3.8–4.9)
Alkaline Phosphatase: 91 IU/L (ref 44–121)
BUN/Creatinine Ratio: 20 (ref 9–20)
BUN: 25 mg/dL — ABNORMAL HIGH (ref 6–24)
Bilirubin Total: 1.5 mg/dL — ABNORMAL HIGH (ref 0.0–1.2)
CO2: 23 mmol/L (ref 20–29)
Calcium: 10 mg/dL (ref 8.7–10.2)
Chloride: 99 mmol/L (ref 96–106)
Creatinine, Ser: 1.23 mg/dL (ref 0.76–1.27)
Globulin, Total: 3.5 g/dL (ref 1.5–4.5)
Glucose: 224 mg/dL — ABNORMAL HIGH (ref 70–99)
Potassium: 4.4 mmol/L (ref 3.5–5.2)
Sodium: 141 mmol/L (ref 134–144)
Total Protein: 7.3 g/dL (ref 6.0–8.5)
eGFR: 69 mL/min/{1.73_m2} (ref >59)

## 2022-09-07 LAB — LIPID PANEL
Chol/HDL Ratio: 2.5 ratio (ref 0.0–5.0)
Cholesterol, Total: 167 mg/dL (ref 100–199)
HDL: 67 mg/dL (ref >39)
LDL Chol Calc (NIH): 75 mg/dL (ref 0–99)
Triglycerides: 148 mg/dL (ref 0–149)
VLDL Cholesterol Cal: 25 mg/dL (ref 5–40)

## 2022-09-07 LAB — CBC WITH DIFFERENTIAL/PLATELET
Basophils Absolute: 0.1 10*3/uL (ref 0.0–0.2)
Basos: 1 %
EOS (ABSOLUTE): 0 10*3/uL (ref 0.0–0.4)
Eos: 0 %
Hematocrit: 42.4 % (ref 37.5–51.0)
Hemoglobin: 14.2 g/dL (ref 13.0–17.7)
Immature Grans (Abs): 0.1 10*3/uL (ref 0.0–0.1)
Immature Granulocytes: 1 %
Lymphocytes Absolute: 1.3 10*3/uL (ref 0.7–3.1)
Lymphs: 16 %
MCH: 30.1 pg (ref 26.6–33.0)
MCHC: 33.5 g/dL (ref 31.5–35.7)
MCV: 90 fL (ref 79–97)
Monocytes Absolute: 0.6 10*3/uL (ref 0.1–0.9)
Monocytes: 8 %
Neutrophils Absolute: 5.9 10*3/uL (ref 1.4–7.0)
Neutrophils: 74 %
Platelets: 312 10*3/uL (ref 150–450)
RBC: 4.71 x10E6/uL (ref 4.14–5.80)
RDW: 11.6 % (ref 11.6–15.4)
WBC: 7.9 10*3/uL (ref 3.4–10.8)

## 2022-09-07 LAB — B12 AND FOLATE PANEL
Folate: 7.4 ng/mL (ref >3.0)
Vitamin B-12: 488 pg/mL (ref 232–1245)

## 2022-09-07 LAB — VITAMIN D 25 HYDROXY (VIT D DEFICIENCY, FRACTURES): Vit D, 25-Hydroxy: 37.7 ng/mL (ref 30.0–100.0)

## 2022-09-22 ENCOUNTER — Ambulatory Visit: Payer: Managed Care, Other (non HMO)

## 2022-09-22 ENCOUNTER — Ambulatory Visit (INDEPENDENT_AMBULATORY_CARE_PROVIDER_SITE_OTHER): Payer: Managed Care, Other (non HMO)

## 2022-09-22 ENCOUNTER — Ambulatory Visit (INDEPENDENT_AMBULATORY_CARE_PROVIDER_SITE_OTHER): Payer: Managed Care, Other (non HMO) | Admitting: Podiatry

## 2022-09-22 DIAGNOSIS — M86671 Other chronic osteomyelitis, right ankle and foot: Secondary | ICD-10-CM | POA: Diagnosis not present

## 2022-09-22 DIAGNOSIS — L97512 Non-pressure chronic ulcer of other part of right foot with fat layer exposed: Secondary | ICD-10-CM | POA: Diagnosis not present

## 2022-09-23 NOTE — Progress Notes (Addendum)
Chief Complaint  Patient presents with   wound care    Patient is here for follow-up wound care.patient states that he is following directives per the provider.    Subjective:  Patient presents today for follow-up evaluation of an ulcer to the plantar aspect of the right foot.  Patient has not been seen here in the office for about 3 months due to missed appointments.  He has been applying dressings to the foot daily.  He presents for further treatment and evaluation  Past Medical History:  Diagnosis Date   Diabetes mellitus without complication ([place])    Hyperlipidemia    Hypertension    Past Surgical History:  Procedure Laterality Date   AMPUTATION Right 04/16/2021   Procedure: AMPUTATION  RIGHT 5TH RAY, INCISION AND DRAINAGE;  Surgeon: Edrick Kins, DPM;  Location: [facility];  Service: Podiatry;  Laterality: Right;   APPLICATION OF WOUND VAC Right 04/21/2021   Procedure: APPLICATION OF WOUND VAC;  Surgeon: Edrick Kins, DPM;  Location: [facility];  Service: Podiatry;  Laterality: Right;   BONE BIOPSY Left 06/27/2020   Procedure: BONE BIOPSY;  Surgeon: Caroline More, DPM;  Location: [facility];  Service: Podiatry;  Laterality: Left;   COLONOSCOPY WITH PROPOFOL N/A 06/22/2022   Procedure: COLONOSCOPY WITH PROPOFOL;  Surgeon: [name], MD;  Location: [facility] ENDOSCOPY;  Service: Gastroenterology;  Laterality: N/A;   INCISION AND DRAINAGE Left 06/27/2020   Procedure: INCISION AND DRAINAGE;  Surgeon: Caroline More, DPM;  Location: [facility];  Service: Podiatry;  Laterality: Left;   IRRIGATION AND DEBRIDEMENT FOOT Right 04/21/2021   Procedure: IRRIGATION AND DEBRIDEMENT FOOT;  Surgeon: Edrick Kins, DPM;  Location: [facility];  Service: Podiatry;  Laterality: Right;   LOWER EXTREMITY ANGIOGRAPHY Left 06/30/2020   Procedure: Lower Extremity Angiography;  Surgeon: Algernon Huxley, MD;  Location: [place] [facility];  Service: Cardiovascular;  Laterality: Left;   LOWER EXTREMITY ANGIOGRAPHY Right  04/14/2021   Procedure: Lower Extremity Angiography;  Surgeon: Algernon Huxley, MD;  Location: [place] [facility];  Service: Cardiovascular;  Laterality: Right;   NO PAST SURGERIES     WOUND DEBRIDEMENT Right 04/19/2021   Procedure: DEBRIDEMENT WOUND;  Surgeon: Edrick Kins, DPM;  Location: [facility];  Service: Podiatry;  Laterality: Right;   No Known Allergies   06/23/2022   09/22/2022  Objective/Physical Exam Increased deterioration along the plantar aspect of the foot with recurrence of new ulcerations that are developed more distally.  No appreciable purulence however the wounds are deep with hypergranular tissue also noted.  Please see above noted photo.  No appreciable malodor.  Radiographic exam RT foot 09/22/2022 Cavus foot type noted.  Normal osseous mineralization.  Osseous erosion and cortical irregularity to the cuboid and fifth metatarsal tubercle as well as new findings of erosive changes around the MTP joints of the foot consistent with patient's given history of chronic osteomyelitis.  Assessment: 1.  Diabetic foot ulcers right foot 2.  Diabetes mellitus type 2; uncontrolled; with peripheral polyneuropathy.  Last A1c 09/05/2022 7.1 3.  Chronic osteomyelitis RT foot   Plan of Care:  1. Patient was evaluated.  X-rays taken today were reviewed  2.  Medically necessary excisional debridement including muscle and deep fascial tissue was performed using a tissue nipper.  Excisional debridement of all necrotic nonviable tissue down to healthier bleeding viable tissue was performed with postdebridement measurement same as pre- 3.  Dry sterile dressings applied 4.  Continue weightbearing in the defender  offloading walker/boot 5.  Expressed concern with the patient today regarding the deterioration and new ulcer development to the foot.  There is concern also based on x-ray for progressive osteomyelitis of the foot. 6.  Patient has repeatedly refused amputation although I do believe  it is in his best interest given the chronic osteomyelitis.  We have been treating the patient conservatively although I am concerned about deterioration and progression of the wound.  Recommend that he goes immediately to the emergency department if symptoms worsen or if you become systemic with fever chills nausea vomiting. 7.  Patient is amenable to updated MRI.  Order placed for MRI RT foot wo contrast 8.  Return to clinic 3 weeks  Edrick Kins, DPM [facility]  Dr. Edrick Kins, DPM    [address], [place] [address]                Office [phone]  Fax [phone]

## 2022-09-28 ENCOUNTER — Ambulatory Visit: Payer: Managed Care, Other (non HMO) | Admitting: Physician Assistant

## 2022-10-05 ENCOUNTER — Ambulatory Visit: Payer: Managed Care, Other (non HMO)

## 2022-10-05 ENCOUNTER — Telehealth: Payer: Self-pay | Admitting: *Deleted

## 2022-10-05 NOTE — Telephone Encounter (Addendum)
Working on prior authorization for MRI

## 2022-10-06 NOTE — Telephone Encounter (Signed)
Called and has been approved for cpt code: 73718,authorization #- [id number] Case  #: [id number] Valid from 10/06/22-04/04/23 This is with [facility] in [place] only.  Contacted the patient and he said that he wanted to have approved thru [place] call them to try to get it approved in [place] call us back. Called the pre-cert '@[facility]'$  to give this information, spoke with  Lorenza Chick C. And tried to reach out to [place], from pre- cert not available.

## 2022-10-09 NOTE — Telephone Encounter (Signed)
Spoke with patient today and he does not want to have the MRI @ [facility], has cancelled the one in [place], stated that he may have to wait until Jan., will come off wife's insurance, will be able to use his medicaid  as primary but will work something out.

## 2022-10-10 ENCOUNTER — Ambulatory Visit: Payer: Managed Care, Other (non HMO)

## 2022-10-17 ENCOUNTER — Ambulatory Visit: Payer: Managed Care, Other (non HMO)

## 2022-10-25 ENCOUNTER — Other Ambulatory Visit: Payer: Self-pay

## 2022-10-25 ENCOUNTER — Emergency Department
Admission: EM | Admit: 2022-10-25 | Discharge: 2022-10-25 | Disposition: A | Discharge status: Home / Self Care | Payer: Managed Care, Other (non HMO) | Attending: Emergency Medicine | Admitting: Emergency Medicine

## 2022-10-25 DIAGNOSIS — I10 Essential (primary) hypertension: Secondary | ICD-10-CM | POA: Diagnosis not present

## 2022-10-25 DIAGNOSIS — M79662 Pain in left lower leg: Secondary | ICD-10-CM | POA: Insufficient documentation

## 2022-10-25 DIAGNOSIS — M79661 Pain in right lower leg: Secondary | ICD-10-CM | POA: Insufficient documentation

## 2022-10-25 DIAGNOSIS — E119 Type 2 diabetes mellitus without complications: Secondary | ICD-10-CM | POA: Insufficient documentation

## 2022-10-25 DIAGNOSIS — M79604 Pain in right leg: Secondary | ICD-10-CM

## 2022-10-25 NOTE — ED Triage Notes (Signed)
Pt comes with c/o right leg and knee pain. Pt stats this all started awhile back and has been on and off. Pt states worse after exercising and stiffens up.

## 2022-10-25 NOTE — ED Provider Notes (Signed)
[facility] Provider Note    Event Date/Time   First MD Initiated Contact with Patient 10/25/22 1530     (approximate)   History   Leg Pain   HPI  [name] is a 56 y.o. male past medical history of diabetes hypertension hyperlipidemia who presents with leg pain.  Patient tells me that he has had intermittent pain in bilateral knees but right greater than left for some time cramping pain and he feels stiff.  He also has some intermittent pains that shoot up the right leg.  He has known peripheral neuropathy from his diabetes.  He also has chronic ulcers on the right foot that he sees podiatry for this.  Dresses the wounds daily.  Denies fevers or chills.  He does note intermittent swelling of the right leg has been going on for some time.  Improves after he elevates the leg.  Denies shortness of breath or chest pain no history of DVT.     Past Medical History:  Diagnosis Date   Diabetes mellitus without complication ([place])    Hyperlipidemia    Hypertension     Patient Active Problem List   Diagnosis Date Noted   B12 deficiency 09/05/2022   Proliferative diabetic retinopathy of both eyes without macular edema associated with type 2 diabetes mellitus ([place]) 06/05/2022   PAD (peripheral artery disease) ([place]) 04/12/2021   Secondary renal hyperparathyroidism ([place]) 03/19/2019   Diabetic ulcer of foot with fat layer exposed ([place]) 12/18/2018   Dyslipidemia associated with type 2 diabetes mellitus ([place]) 07/03/2018   Vitamin D deficiency 04/11/2018   ED (erectile dysfunction) 04/10/2018   Lower urinary tract symptoms (LUTS) 02/09/2017   Hypertension 07/05/2015   Hyperglycemia due to type 2 diabetes mellitus ([place]) 07/05/2015   HLD (hyperlipidemia) 07/05/2015     Physical Exam  Triage Vital Signs: ED Triage Vitals  Enc Vitals Group     BP 10/25/22 1417 (!) 178/97     Pulse Rate 10/25/22 1417 78     Resp 10/25/22 1417 18     Temp 10/25/22 1417 98 F  (36.7 C)     Temp src --      SpO2 10/25/22 1417 98 %     Weight --      Height --      Head Circumference --      Peak Flow --      Pain Score 10/25/22 1415 0     Pain Loc --      Pain Edu? --      Excl. in [place]? --     Most recent vital signs: Vitals:   10/25/22 1417  BP: (!) 178/97  Pulse: 78  Resp: 18  Temp: 98 F (36.7 C)  SpO2: 98%     General: Awake, no distress.  CV:  Good peripheral perfusion.  Resp:  Normal effort.  Abd:  No distention.  Neuro:             Awake, Alert, Oriented x 3  Other:  There is swelling of the distal right lower extremity with chronic stasis changes, no tenderness compartment soft no erythema or warmth Well-healing ulceration on the plantar aspect of the right foot without surrounding cellulitis or drainage No palpable DP or PT pulse on either lower extremity but the feet are warm and symmetric in temperature and color Patient is able to range both knees bilaterally no effusion no tenderness or asymmetry   ED Results / Procedures /  Treatments  Labs (all labs ordered are listed, but only abnormal results are displayed) Labs Reviewed - No data to display   EKG     RADIOLOGY    PROCEDURES:  Critical Care performed: No  Procedures  MEDICATIONS ORDERED IN ED: Medications - No data to display   IMPRESSION / MDM / [place] / ED COURSE  I reviewed the triage vital signs and the nursing notes.                              Patient's presentation is most consistent with acute, uncomplicated illness.  Differential diagnosis includes, but is not limited to, osteoarthritis, peripheral neuropathy, DVT, chronic arterial insufficiency  The patient is a 56 year old male with diabetes and chronic foot wound on the right plantar surface presents because of leg pain.  Patient endorses off-and-on bilateral knee pain for some time not acute today.  Right seems to be worse than left.  Says that the right knee is actually better  than it had been after he had his amputation on the right toes.  He also endorses some shooting pain up the right leg seems to be worse when he has been on it yesterday there is minimal pain at rest.  He endorses intermittent swelling to the right lower extremity and this again has been a chronic issue.  There was no acute change today patient just thought that he would come get it checked out.  On exam he does have findings to suggest chronic arterial insufficiency with lack of hair on the lower extremities and shiny appearance.  Palpable pulses on either extremity but this is symmetric and the feet are warm and he has good cap refill I suspect microvascular disease secondary to diabetes.  He does have asymmetric swelling involving primarily the right ankle and distal calf but there are chronic skin changes.  He has an ulceration on the plantar surface of the right foot that looks chronic and does not look acutely infected today.  He is seeing podiatry for this scheduled for an outpatient MRI soon.  Patient's knee exam is benign.  Ultimately I suspect that there is likely combination of issues likely some underlying osteoarthritis in the knees causing some of the pain as well as his peripheral neuropathy.  I suspect that the swelling is related to his decreased mobility that leg given he is often in a cam boot and has had amputations of the feet.  With the skin changes and chronic swelling I feel that DVT is less likely.  I have low suspicion for acute limb ischemia given patient has no pain currently and symmetric vascular exam.  Stressed the importance of glucose control and follow-up with primary care doctor and podiatry.  Do not feel that any other urgent work-up is necessary today.  Recommended Tylenol for pain.     FINAL CLINICAL IMPRESSION(S) / ED DIAGNOSES   Final diagnoses:  Pain in both lower extremities     Rx / DC Orders   ED Discharge Orders     None        Note:  This document was  prepared using Dragon voice recognition software and may include unintentional dictation errors.   Rada Hay, MD 10/25/22 [phone]

## 2022-10-25 NOTE — Discharge Instructions (Addendum)
I suspect that you have underlying osteoarthritis in the knees.  Your right lower extremity pain is probably from a combination of pain relating to your altered gait with the wounds on your feet and due to peripheral neuropathy.  You can take Tylenol for pain.  I would like you to follow-up with the vascular doctors as you may need to have additional testing to check the blood supply to your legs.  Please elevate the leg when you are sitting down and wear a compression stocking.

## 2022-10-27 ENCOUNTER — Ambulatory Visit: Payer: Managed Care, Other (non HMO) | Admitting: Podiatry

## 2022-10-30 ENCOUNTER — Encounter: Payer: Managed Care, Other (non HMO) | Attending: Physician Assistant | Admitting: Physician Assistant

## 2022-10-30 DIAGNOSIS — I10 Essential (primary) hypertension: Secondary | ICD-10-CM | POA: Diagnosis not present

## 2022-10-30 DIAGNOSIS — L97512 Non-pressure chronic ulcer of other part of right foot with fat layer exposed: Secondary | ICD-10-CM | POA: Insufficient documentation

## 2022-10-30 DIAGNOSIS — E11621 Type 2 diabetes mellitus with foot ulcer: Secondary | ICD-10-CM | POA: Insufficient documentation

## 2022-10-30 DIAGNOSIS — N400 Enlarged prostate without lower urinary tract symptoms: Secondary | ICD-10-CM | POA: Insufficient documentation

## 2022-10-30 DIAGNOSIS — E785 Hyperlipidemia, unspecified: Secondary | ICD-10-CM | POA: Diagnosis not present

## 2022-10-30 NOTE — Progress Notes (Signed)
DEJOHN, IBARRA ([id number]) [id number] Page 1 of 5 Visit Report for 10/30/2022 Abuse Risk Screen Details Patient Name: Date of Service: [name], [name] 10/30/2022 3:45 PM Medical Record Number: [id number] Patient Account Number: [id number] Date of Birth/Sex: Treating RN: [birth year] (56 y.o. [name] Primary Care Teodoro Jeffreys: [name] Other Clinician: Massie Kluver Referring Celia Friedland: Treating Arieon Scalzo/Extender: Griselda Miner Weeks in Treatment: 0 Abuse Risk Screen Items Answer Electronic Signature(s) Signed: 10/30/2022 4:59:20 PM By: Rosalio Loud MSN RN CNS WTA Entered By: Rosalio Loud on 10/30/2022 16:10:21 -------------------------------------------------------------------------------- Activities of Daily Living Details Patient Name: Date of Service: [name], [name] 10/30/2022 3:45 PM Medical Record Number: [id number] Patient Account Number: [id number] Date of Birth/Sex: Treating RN: 1966-02-09 (56 y.o. [name] Primary Care Shyan Scalisi: [name] Other Clinician: Massie Kluver Referring Cj Beecher: Treating Hridaan Bouse/Extender: Griselda Miner Weeks in Treatment: 0 Activities of Daily Living Items Answer Activities of Daily Living (Please select one for each item) Drive Automobile Completely Able T Medications ake Completely Able Use T elephone Completely Able Care for Appearance Completely Able Use T oilet Completely Able Bath / Shower Completely Able Dress Self Completely Able Feed Self Completely Able Walk Completely Able Get In / Out Bed Completely Able Housework Completely Able Prepare Meals Completely Able Handle Money Completely Able Shop for Self Completely [name], [name] ([id number]) [phone] [id number] Page 2 of 5 Electronic Signature(s) Signed: 10/30/2022 4:59:20 PM By: Rosalio Loud MSN RN CNS WTA Entered By: Rosalio Loud on 10/30/2022  16:10:36 -------------------------------------------------------------------------------- Education Screening Details Patient Name: Date of Service: Zachary, Dillon 10/30/2022 3:45 PM Medical Record Number: [id number] Patient Account Number: [id number] Date of Birth/Sex: Treating RN: [birth year] (56 y.o. [name] Primary Care Darald Uzzle: [name] Other Clinician: Massie Kluver Referring Lucia Mccreadie: Treating Langley Flatley/Extender: Primitivo Gauze in Treatment: 0 Learning Preferences/Education Level/Primary Language Learning Preference: Demonstration Highest Education Level: High School Preferred Language: English Cognitive Barrier Language Barrier: No Translator Needed: No Memory Deficit: No Emotional Barrier: No Cultural/Religious Beliefs Affecting Medical Care: No Physical Barrier Impaired Vision: No Impaired Hearing: No Decreased Hand dexterity: No Knowledge/Comprehension Knowledge Level: High Comprehension Level: High Ability to understand written instructions: High Ability to understand verbal instructions: High Motivation Anxiety Level: Calm Cooperation: Cooperative Education Importance: Acknowledges Need Interest in Health Problems: Asks Questions Perception: Coherent Willingness to Engage in Self-Management High Activities: Readiness to Engage in Self-Management High Activities: Electronic Signature(s) Signed: 10/30/2022 4:59:20 PM By: Rosalio Loud MSN RN CNS WTA Entered By: Rosalio Loud on 10/30/2022 16:11:03 Symonds, Barbara Cower ([id number]) [id number] Page 3 of 5 -------------------------------------------------------------------------------- Fall Risk Assessment Details Patient Name: Date of Service: [name], [name] 10/30/2022 3:45 PM Medical Record Number: [id number] Patient Account Number: [id number] Date of Birth/Sex: Treating RN: 1966/04/21 (56 y.o. [name] Primary Care Ivyonna Hoelzel: [name] Other Clinician: Massie Kluver Referring Isobel Eisenhuth: Treating Leyana Whidden/Extender: Griselda Miner Weeks in Treatment: 0 Fall Risk Assessment Items Have you had 2 or more falls in the last 12 monthso 0 No Have you had any fall that resulted in injury in the last 12 monthso 0 No FALLS RISK SCREEN History of falling - immediate or within 3 months 0 No Secondary diagnosis (Do you have 2 or more medical diagnoseso) 0 No Ambulatory aid None/bed rest/wheelchair/nurse 0 No Crutches/cane/walker 0 No Furniture 0 No Intravenous therapy Access/Saline/Heparin Lock 0 No Gait/Transferring Normal/ bed rest/ wheelchair 0 No Weak (short steps with or without shuffle, stooped but able  to lift head while walking, may seek 0 No support from furniture) Impaired (short steps with shuffle, may have difficulty arising from chair, head down, impaired 0 No balance) Mental Status Oriented to own ability 0 No Electronic Signature(s) Signed: 10/30/2022 4:59:20 PM By: Rosalio Loud MSN RN CNS WTA Entered By: Rosalio Loud on 10/30/2022 16:11:16 -------------------------------------------------------------------------------- Foot Assessment Details Patient Name: Date of Service: Zachary [place]. 10/30/2022 3:45 PM Medical Record Number: [id number] Patient Account Number: [id number] Date of Birth/Sex: Treating RN: 03-Apr-1966 (56 y.o. [name] Primary Care Bryanah Sidell: [name] Other Clinician: Massie Kluver Referring Priscila Bean: Treating Laurencia Roma/Extender: Griselda Miner Weeks in Treatment: 0 Foot Assessment Items Site Locations [place], [place] Carlean Jews ([id number]) [phone] [id number] Page 4 of 5 + = Sensation present, - = Sensation absent, C = Callus, U = Ulcer R = Redness, W = Warmth, M = Maceration, PU = Pre-ulcerative lesion F = Fissure, S = Swelling, D = Dryness Assessment Right: Left: Other Deformity: No No Prior Foot Ulcer: Yes No Prior  Amputation: Yes No Charcot Joint: No No Ambulatory Status: Ambulatory Without Help Gait: Steady Electronic Signature(s) Signed: 10/30/2022 4:59:20 PM By: Rosalio Loud MSN RN CNS WTA Entered By: Rosalio Loud on 10/30/2022 16:13:17 -------------------------------------------------------------------------------- Nutrition Risk Screening Details Patient Name: Date of Service: [name], [name] 10/30/2022 3:45 PM Medical Record Number: [id number] Patient Account Number: [id number] Date of Birth/Sex: Treating RN: Jan 15, 1966 (56 y.o. [name] Primary Care Othman Masur: [name] Other Clinician: Massie Kluver Referring Emanuela Runnion: Treating Milford Cilento/Extender: Griselda Miner Weeks in Treatment: 0 Height (in): 72 Weight (lbs): 190 Body Mass Index (BMI): 25.8 Nutrition Risk Screening Items Score Screening NUTRITION RISK SCREEN: I have an illness or condition that made me change the kind and/or amount of food I eat 0 No I eat fewer than two meals per day 0 No I eat few fruits and vegetables, or milk products 0 No I have three or more drinks of beer, liquor or wine almost every day 0 No I have tooth or mouth problems that make it hard for me to eat 0 No I don't always have enough money to buy the food I need 0 No [name], [name] ([id number]) [id number] Page 5 of 5 I eat alone most of the time 0 No I take three or more different prescribed or over-the-counter drugs a day 0 No Without wanting to, I have lost or gained 10 pounds in the last six months 0 No I am not always physically able to shop, cook and/or feed myself 0 No Nutrition Protocols Good Risk Protocol 0 No interventions needed Moderate Risk Protocol High Risk Proctocol Risk Level: Good Risk Score: 0 Electronic Signature(s) Signed: 10/30/2022 4:59:20 PM By: Rosalio Loud MSN RN CNS WTA Entered By: Rosalio Loud on 10/30/2022 16:11:23

## 2022-10-31 ENCOUNTER — Telehealth: Payer: Self-pay | Admitting: Podiatry

## 2022-10-31 NOTE — Telephone Encounter (Signed)
Patient called and stated that he missed his MRI and they stated he would need a new referral to schedule a new appointment,  Please advise

## 2022-10-31 NOTE — Progress Notes (Signed)
[name], [name] ([id number]) [id number] Page 1 of 9 Visit Report for 10/30/2022 Chief Complaint Document Details Patient Name: Date of Service: SIDI, DZIKOWSKI 10/30/2022 3:45 PM Medical Record Number: [id number] Patient Account Number: [id number] Date of Birth/Sex: Treating RN: [birth year] (56 y.o. Verl Blalock Primary Care Provider: [name] Other Clinician: Massie Kluver Referring Provider: Treating Provider/Extender: Griselda Miner Weeks in Treatment: 0 Information Obtained from: Patient Chief Complaint Right foot ulcers Electronic Signature(s) Signed: 10/30/2022 4:59:20 PM By: Rosalio Loud MSN RN CNS WTA Signed: 10/31/2022 8:14:16 AM By: Worthy Keeler PA-C Previous Signature: 10/30/2022 4:22:59 PM Version By: Worthy Keeler PA-C Entered By: Rosalio Loud on 10/30/2022 16:36:21 -------------------------------------------------------------------------------- HPI Details Patient Name: Date of Service: [name], [name] 10/30/2022 3:45 PM Medical Record Number: [id number] Patient Account Number: [id number] Date of Birth/Sex: Treating RN: [birth year] (56 y.o. Verl Blalock Primary Care Provider: [name] Other Clinician: Massie Kluver Referring Provider: Treating Provider/Extender: Griselda Miner Weeks in Treatment: 0 History of Present Illness HPI Description: ADMISSION 07/07/2020 This is a 56 year old man with type 2 diabetes. He was admitted to hospital from 7/16 through 7/22 having apparently a ulcer on his foot for a prolonged period of time. He was discovered to have an abscess as well as osteomyelitis. This was documented by MRI [see below] he underwent 1/5 metatarsal base resection by Dr. Luana Shu of podiatry. Intraoperative culture showed MSSA. I believe he and another operative debridement at which time cultures showed Streptococcus mitis and group B strep. In the hospital the patient was treated with Vanco and  Zosyn and subsequently Unasyn but he was discharged on Dalavance weekly x5 doses of which he has had 1 dose. He was also reviewed by Dr. Lucky Cowboy while he was in hospital. He had an angiogram that is shown below. 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 The patient was discharged with a wound VAC over the surgical site however no arrangements were made for the wound VAC change. He does not have home health. In epic it shows communication between the infectious disease office and podiatry asking about who would be changing the [facility] and largely he is here for review of the wound and VAC changes. ERON, STAAT ([id number]) [id number] Page 2 of 9 Angiogram 7/21 Aortogram: The renal arteries were widely patent. The aorta and iliac arteries were widely patent without significant stenosis Left lower Extremity: Normal common femoral artery, profunda femoris artery, and superficial femoral and popliteal arteries. The tibials started with a very large vessel going straight down that appeared to be the peroneal proximally and what appeared to be the anterior tibial coming off lateral to this. Following these down to the foot, the large vessel actually coursed medially and the typical course of the posterior tibial artery and was patent into the midfoot where there was small vessel disease at that point. What appeared to be the anterior tibial artery at the takeoff actually coursed as the peroneal artery with a typical termination at the ankle which was feeding some small collaterals into the foot. No true anterior tibial artery was seen. This appeared to be a congenital configuration but no vascular disease requiring treatment was present. MRI 7/16 IMPRESSION: 1. Large area of ulceration over the lateral base the fifth metatarsal with a sinus tract, subcutaneous emphysema and  a loculated abscess as described above. 2. Findings  suggestive of early osteomyelitis involving the fifth metatarsal base. 3. Mild reactive marrow seen within the anterior calcaneus, cuboid, and lateral cuneiform. Past medical history includes type 2 diabetes, hypertension, hyperlipidemia, BPH Readmission: 10-30-2022 patient presents today for initial inspection here in our clinic as a referral from Dr. Amalia Hailey. He is actually supposed to have had an MRI even before coming to [place] but apparently has not. He has wounds on the bottom of his foot 1 that is closer to the forefoot and one that is further down more towards the arch and central portion of his foot. He does have fifth ray amputation. With that being said I feel like his the fifth metatarsal gets the most problematic area based on what I am seeing currently and I am concerned about the possibility of osteomyelitis based on what we see. With that being said I do feel like the patient needs a MRI to be performed soon as possible. We may also need to obtain a bone biopsy and/or culture for confirmation purposes depend on how things proceed. With that being said overall I feel like that the patient's wounds are significant and I think there is a strong possibility that he has osteomyelitis even currently. I think he may be a good hyperbaric oxygen therapy candidate depending on how things proceed. This is definitely a grade 2 diabetic wound at this point possibly grade 3. Patient does have a history of hypertension but really no other major medical problems which is good news. His ABI today was 1. Electronic Signature(s) Signed: 10/30/2022 5:01:32 PM By: Worthy Keeler PA-C Entered By: Worthy Keeler on 10/30/2022 17:01:32 -------------------------------------------------------------------------------- Physical Exam Details Patient Name: Date of Service: [name], [name] 10/30/2022 3:45 PM Medical Record Number: [id number] Patient Account  Number: [id number] Date of Birth/Sex: Treating RN: [birth year] (56 y.o. Verl Blalock Primary Care Provider: [name] Other Clinician: Massie Kluver Referring Provider: Treating Provider/Extender: Griselda Miner Weeks in Treatment: 0 Constitutional patient is hypertensive.. pulse regular and within target range for patient.Marland Kitchen respirations regular, non-labored and within target range for patient.Marland Kitchen temperature within target range for patient.. Well-nourished and well-hydrated in no acute distress. Eyes conjunctiva clear no eyelid edema noted. pupils equal round and reactive to light and accommodation. Ears, Nose, Mouth, and Throat no gross abnormality of ear auricles or external auditory canals. normal hearing noted during conversation. mucus membranes moist. Respiratory normal breathing without difficulty. Cardiovascular 2+ dorsalis pedis/posterior tibialis pulses. no clubbing, cyanosis, significant edema, <3 sec cap refill. Musculoskeletal normal gait and posture. Psychiatric this patient is able to make decisions and demonstrates good insight into disease process. Alert and Oriented x 3. pleasant and cooperative. SUMEET, GETER ([id number]) [id number] Page 3 of 9 Notes Upon inspection patient's wound bed actually showed signs of what appears to be wounds on the metatarsal region of the fifth metatarsal proximal and distal. This is at the site of the fifth ray amputation. He also has a wound that is closer into the forefoot between the first and second metatarsal region just below the toes. Subsequently both areas are quite significant at this point. The one in the fifth metatarsal region is worse however and does appear to be a very complex wound. I think this is a strong possibility of osteomyelitis he is not on any antibiotics currently I Georgina Peer going place him on doxycycline at this point. Electronic Signature(s) Signed: 10/30/2022 5:02:48  PM By: Worthy Keeler PA-C Entered By: Worthy Keeler on 10/30/2022 17:02:48 --------------------------------------------------------------------------------  Physician Orders Details Patient Name: Date of Service: [name], [name] 10/30/2022 3:45 PM Medical Record Number: [id number] Patient Account Number: [id number] Date of Birth/Sex: Treating RN: [birth year] (56 y.o. [place] Meth Primary Care Provider: [name] Other Clinician: Massie Kluver Referring Provider: Treating Provider/Extender: Griselda Miner Weeks in Treatment: 0 Verbal / Phone Orders: No Diagnosis Coding ICD-10 Coding Code Description E11.621 Type 2 diabetes mellitus with foot ulcer L97.512 Non-pressure chronic ulcer of other part of right foot with fat layer exposed I10 Essential (primary) hypertension Follow-up Appointments Return Appointment in 1 week. Bathing/ Shower/ Hygiene May shower; gently cleanse wound with antibacterial soap, rinse and pat dry prior to dressing wounds Wound Treatment Wound #2 - Foot Wound Laterality: Right, Lateral Cleanser: Byram Ancillary Kit - 15 Day Supply (DME) (Generic) 3 x Per Week/30 Days Discharge Instructions: Use supplies as instructed; Kit contains: (15) Saline Bullets; (15) 3x3 Gauze; 15 pr Gloves Cleanser: Wound Cleanser 3 x Per Week/30 Days Discharge Instructions: Wash your hands with soap and water. Remove old dressing, discard into plastic bag and place into trash. Cleanse the wound with Wound Cleanser prior to applying a clean dressing using gauze sponges, not tissues or cotton balls. Do not scrub or use excessive force. Pat dry using gauze sponges, not tissue or cotton balls. Prim Dressing: IODOFLEX 0.9% Cadexomer Iodine Pad (DME) (Dispense As Written) 3 x Per Week/30 Days ary Discharge Instructions: Apply Iodoflex to wound bed only as directed. Secondary Dressing: ABD Pad 5x9 (in/in) 3 x Per Week/30 Days Discharge Instructions: Cover with ABD  pad Secured With: Kerlix Roll Sterile or Non-Sterile 6-ply 4.5x4 (yd/yd) 3 x Per Week/30 Days Discharge Instructions: Apply Kerlix as directed Wound #3 - Foot Wound Laterality: Plantar, Right Cleanser: Byram Ancillary Kit - 15 Day Supply (DME) (Generic) 3 x Per Week/30 Days Discharge Instructions: Use supplies as instructed; Kit contains: (15) Saline Bullets; (15) 3x3 Gauze; 15 pr Gloves Cleanser: Wound Cleanser 3 x Per Week/30 Days Discharge Instructions: Wash your hands with soap and water. Remove old dressing, discard into plastic bag and place into trash. Cleanse the wound with Wound Cleanser prior to applying a clean dressing using gauze sponges, not tissues or cotton balls. Do not scrub or use excessive KYAIR, DITOMMASO ([id number]) [id number] Page 4 of 9 force. Pat dry using gauze sponges, not tissue or cotton balls. Prim Dressing: IODOFLEX 0.9% Cadexomer Iodine Pad (DME) (Dispense As Written) 3 x Per Week/30 Days ary Discharge Instructions: Apply Iodoflex to wound bed only as directed. Secondary Dressing: ABD Pad 5x9 (in/in) 3 x Per Week/30 Days Discharge Instructions: Cover with ABD pad Secured With: Kerlix Roll Sterile or Non-Sterile 6-ply 4.5x4 (yd/yd) 3 x Per Week/30 Days Discharge Instructions: Apply Kerlix as directed Patient Medications llergies: No Known Drug Allergies A Notifications Medication Indication Start End 10/30/2022 doxycycline hyclate DOSE 1 - oral 100 mg capsule - 1 capsule oral twice a day x 30 days Electronic Signature(s) Signed: 10/30/2022 4:50:45 PM By: Rosalio Loud MSN RN CNS WTA Signed: 10/31/2022 8:14:16 AM By: Worthy Keeler PA-C Previous Signature: 10/30/2022 4:49:03 PM Version By: Worthy Keeler PA-C Entered By: Rosalio Loud on 10/30/2022 16:50:44 -------------------------------------------------------------------------------- Problem List Details Patient Name: Date of Service: LENNARD, CAPEK 10/30/2022 3:45  PM Medical Record Number: [id number] Patient Account Number: [id number] Date of Birth/Sex: Treating RN: 07-14-1966 (56 y.o. Verl Blalock Primary Care Provider: [name] Other Clinician: Massie Kluver Referring Provider: Treating Provider/Extender: Griselda Miner Weeks in Treatment: 0  Active Problems ICD-10 Encounter Code Description Active Date MDM Diagnosis E11.621 Type 2 diabetes mellitus with foot ulcer 10/30/2022 No Yes L97.512 Non-pressure chronic ulcer of other part of right foot with fat layer exposed 10/30/2022 No Yes I10 Essential (primary) hypertension 10/30/2022 No Yes Inactive Problems Resolved Problems Electronic Signature(s) Signed: 10/30/2022 4:59:20 PM By: Rosalio Loud MSN RN CNS Budd Palmer, Barbara Cower ([id number]) [id number] Page 5 of 9 Signed: 10/31/2022 8:14:16 AM By: Worthy Keeler PA-C Previous Signature: 10/30/2022 4:24:41 PM Version By: Worthy Keeler PA-C Entered By: Rosalio Loud on 10/30/2022 16:35:59 -------------------------------------------------------------------------------- Progress Note Details Patient Name: Date of Service: [name], [name] 10/30/2022 3:45 PM Medical Record Number: [id number] Patient Account Number: [id number] Date of Birth/Sex: Treating RN: 1966-10-11 (56 y.o. Verl Blalock Primary Care Provider: [name] Other Clinician: Massie Kluver Referring Provider: Treating Provider/Extender: Griselda Miner Weeks in Treatment: 0 Subjective Chief Complaint Information obtained from Patient Right foot ulcers History of Present Illness (HPI) ADMISSION 07/07/2020 This is a 56 year old man with type 2 diabetes. He was admitted to hospital from 7/16 through 7/22 having apparently a ulcer on his foot for a prolonged period of time. He was discovered to have an abscess as well as osteomyelitis. This was documented by MRI [see below] he underwent 1/5 metatarsal base resection by  Dr. Luana Shu of podiatry. Intraoperative culture showed MSSA. I believe he and another operative debridement at which time cultures showed Streptococcus mitis and group B strep. In the hospital the patient was treated with Vanco and Zosyn and subsequently Unasyn but he was discharged on Dalavance weekly x5 doses of which he has had 1 dose. He was also reviewed by Dr. Lucky Cowboy while he was in hospital. He had an angiogram that is shown below. 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 The patient was discharged with a wound VAC over the surgical site however no arrangements were made for the wound VAC change. He does not have home health. In epic it shows communication between the infectious disease office and podiatry asking about who would be changing the [facility] and largely he is here for review of the wound and VAC changes. Angiogram 7/21 Aortogram: The renal arteries were widely patent. The aorta and iliac arteries were widely patent without significant stenosis Left lower Extremity: Normal common femoral artery, profunda femoris artery, and superficial femoral and popliteal arteries. The tibials started with a very large vessel going straight down that appeared to be the peroneal proximally and what appeared to be the anterior tibial coming off lateral to this. Following these down to the foot, the large vessel actually coursed medially and the typical course of the posterior tibial artery and was patent into the midfoot where there was small vessel disease at that point. What appeared to be the anterior tibial artery at the takeoff actually coursed as the peroneal artery with a typical termination at the ankle which was feeding some small collaterals into the foot. No true anterior tibial artery was seen. This appeared to be a congenital configuration but no vascular disease requiring treatment was  present. MRI 7/16 IMPRESSION: 1. Large area of ulceration over the lateral base the fifth metatarsal with a sinus tract, subcutaneous emphysema and a loculated abscess as described above. 2. Findings suggestive of early osteomyelitis involving the fifth metatarsal base. 3. Mild reactive marrow seen within the anterior calcaneus, cuboid,  and lateral cuneiform. Past medical history includes type 2 diabetes, hypertension, hyperlipidemia, BPH Readmission: 10-30-2022 patient presents today for initial inspection here in our clinic as a referral from Dr. Amalia Hailey. He is actually supposed to have had an MRI even before coming to [place] but apparently has not. He has wounds on the bottom of his foot 1 that is closer to the forefoot and one that is further down more towards the arch and central portion of his foot. He does have fifth ray amputation. With that being said I feel like his the fifth metatarsal gets the most problematic area based on what I am seeing currently and I am concerned about the possibility of osteomyelitis based on what we see. With that being said I do feel like the patient needs a MRI to be performed soon as possible. We may also need to obtain a bone biopsy and/or culture for confirmation purposes depend on how things proceed. With that being said overall I feel like that the patient's wounds are significant and I think there is a strong possibility that he has osteomyelitis even currently. I think he may be a good hyperbaric oxygen therapy candidate depending on how things proceed. This is definitely a grade 2 diabetic wound at this point possibly grade 3. Patient does have a history of hypertension but really no other major medical problems which is good news. His ABI today was 1. Patient History Information obtained from Patient. [name], [name] ([id number]) [id number] Page 6 of 9 Allergies No Known Drug Allergies Family History Heart Disease -  Father, Hypertension - Father, No family history of Cancer, Diabetes. Social History Never smoker, Marital Status - Married, Alcohol Use - Moderate, Drug Use - No History, Caffeine Use - Never. Medical History Cardiovascular Patient has history of Hypertension Endocrine Patient has history of Type II Diabetes - A1C 7.1 Hospitalization/Surgery History - [facility]- Left Foot. Medical A Surgical History Notes nd Genitourinary High protein Objective Constitutional patient is hypertensive.. pulse regular and within target range for patient.Marland Kitchen respirations regular, non-labored and within target range for patient.Marland Kitchen temperature within target range for patient.. Well-nourished and well-hydrated in no acute distress. Vitals Time Taken: 3:50 PM, Height: 72 in, Source: Stated, Weight: 190 lbs, BMI: 25.8, Temperature: 98.0 F, Pulse: 83 bpm, Respiratory Rate: 16 breaths/min, Blood Pressure: 178/81 mmHg. Eyes conjunctiva clear no eyelid edema noted. pupils equal round and reactive to light and accommodation. Ears, Nose, Mouth, and Throat no gross abnormality of ear auricles or external auditory canals. normal hearing noted during conversation. mucus membranes moist. Respiratory normal breathing without difficulty. Cardiovascular 2+ dorsalis pedis/posterior tibialis pulses. no clubbing, cyanosis, significant edema, Musculoskeletal normal gait and posture. Psychiatric this patient is able to make decisions and demonstrates good insight into disease process. Alert and Oriented x 3. pleasant and cooperative. General Notes: Upon inspection patient's wound bed actually showed signs of what appears to be wounds on the metatarsal region of the fifth metatarsal proximal and distal. This is at the site of the fifth ray amputation. He also has a wound that is closer into the forefoot between the first and second metatarsal region just below the toes. Subsequently both areas are quite significant at this point.  The one in the fifth metatarsal region is worse however and does appear to be a very complex wound. I think this is a strong possibility of osteomyelitis he is not on any antibiotics currently I Georgina Peer going place him on doxycycline at this point. Integumentary (Hair, Skin) Wound #2  status is Open. Original cause of wound was Gradually Appeared. The date acquired was: 08/13/2022. The wound is located on the Right,Lateral Foot. The wound measures 1.5cm length x 1.5cm width x 0.4cm depth; 1.767cm^2 area and 0.707cm^3 volume. There is bone, muscle, and Fat Layer (Subcutaneous Tissue) exposed. There is no tunneling noted, however, there is undermining starting at 8:00 and ending at 7:00 with a maximum distance of 1cm. There is a medium amount of serous drainage noted. Foul odor after cleansing was noted. There is small (1-33%) red, pink granulation within the wound bed. There is a small (1-33%) amount of necrotic tissue within the wound bed including Adherent Slough and Necrosis of Muscle. Wound #3 status is Open. Original cause of wound was Gradually Appeared. The date acquired was: 08/21/2022. The wound is located on the [place]. The wound measures 0.7cm length x 0.5cm width x 0.2cm depth; 0.275cm^2 area and 0.055cm^3 volume. There is Fat Layer (Subcutaneous Tissue) exposed. There is no tunneling noted, however, there is undermining starting at 11:00 and ending at 1:00 with a maximum distance of 0.3cm. There is a medium amount of serosanguineous drainage noted. There is small (1-33%) red, pink granulation within the wound bed. There is a small (1-33%) amount of necrotic tissue within the wound bed including Adherent Slough. Assessment Active Problems ICD-10 Type 2 diabetes mellitus with foot ulcer MADHAV, MOHON ([id number]) [id number] Page 7 of 9 Non-pressure chronic ulcer of other part of right foot with fat layer exposed Essential (primary)  hypertension Plan Follow-up Appointments: Return Appointment in 1 week. Bathing/ Shower/ Hygiene: May shower; gently cleanse wound with antibacterial soap, rinse and pat dry prior to dressing wounds The following medication(s) was prescribed: doxycycline hyclate oral 100 mg capsule 1 1 capsule oral twice a day x 30 days starting 10/30/2022 WOUND #2: - Foot Wound Laterality: Right, Lateral Cleanser: Byram Ancillary Kit - 15 Day Supply (DME) (Generic) 3 x Per Week/30 Days Discharge Instructions: Use supplies as instructed; Kit contains: (15) Saline Bullets; (15) 3x3 Gauze; 15 pr Gloves Cleanser: Wound Cleanser 3 x Per Week/30 Days Discharge Instructions: Wash your hands with soap and water. Remove old dressing, discard into plastic bag and place into trash. Cleanse the wound with Wound Cleanser prior to applying a clean dressing using gauze sponges, not tissues or cotton balls. Do not scrub or use excessive force. Pat dry using gauze sponges, not tissue or cotton balls. Prim Dressing: IODOFLEX 0.9% Cadexomer Iodine Pad (DME) (Dispense As Written) 3 x Per Week/30 Days ary Discharge Instructions: Apply Iodoflex to wound bed only as directed. Secondary Dressing: ABD Pad 5x9 (in/in) 3 x Per Week/30 Days Discharge Instructions: Cover with ABD pad Secured With: Kerlix Roll Sterile or Non-Sterile 6-ply 4.5x4 (yd/yd) 3 x Per Week/30 Days Discharge Instructions: Apply Kerlix as directed WOUND #3: - Foot Wound Laterality: Plantar, Right Cleanser: Byram Ancillary Kit - 15 Day Supply (DME) (Generic) 3 x Per Week/30 Days Discharge Instructions: Use supplies as instructed; Kit contains: (15) Saline Bullets; (15) 3x3 Gauze; 15 pr Gloves Cleanser: Wound Cleanser 3 x Per Week/30 Days Discharge Instructions: Wash your hands with soap and water. Remove old dressing, discard into plastic bag and place into trash. Cleanse the wound with Wound Cleanser prior to applying a clean dressing using gauze sponges, not  tissues or cotton balls. Do not scrub or use excessive force. Pat dry using gauze sponges, not tissue or cotton balls. Prim Dressing: IODOFLEX 0.9% Cadexomer Iodine Pad (DME) (Dispense As Written) 3 x Per  Week/30 Days ary Discharge Instructions: Apply Iodoflex to wound bed only as directed. Secondary Dressing: ABD Pad 5x9 (in/in) 3 x Per Week/30 Days Discharge Instructions: Cover with ABD pad Secured With: Kerlix Roll Sterile or Non-Sterile 6-ply 4.5x4 (yd/yd) 3 x Per Week/30 Days Discharge Instructions: Apply Kerlix as directed 1. Based on what I see I do believe that the patient may need to undergo some fairly significant debridement that was not undertaken today as I want him to have the MRI which we discussed as well today. It may even require bone debridement we will see how things proceed. 2. I am going to suggest as well that we have the patient continue to take something from an antibiotic standpoint I think doxycycline is probably to be a good option here. 3. I am also can recommend that we have him use Iodoflex as a dressing of choice to help clean up the surface of the wounds I think this will do a good job here for him. 4. I am also can recommend from the standpoint of the MRI that he should contact them today in order to go ahead and get this set up. We need to have this sooner rather than later. The patient voiced understanding. We will see patient back for reevaluation in 1 week here in the clinic. If anything worsens or changes patient will contact our office for additional recommendations. I do believe he may be a case of hyperbaric oxygen. I think limb salvage is going to be what we are looking at here as I feel like there is a greater than 90% chance that he has osteomyelitis based on physical exam findings. Electronic Signature(s) Signed: 10/30/2022 5:03:59 PM By: Worthy Keeler PA-C Entered By: Worthy Keeler on 10/30/2022  17:03:59 -------------------------------------------------------------------------------- ROS/PFSH Details Patient Name: Date of Service: [name], [name] 10/30/2022 3:45 PM Medical Record Number: [id number] Patient Account Number: [id number] Date of Birth/Sex: Treating RN: [birth year] (56 y.o. [place] Meth Primary Care Provider: [name] Other Clinician: Massie Kluver Referring Provider: Treating Provider/Extender: Griselda Miner [place], [place] ([id number]) [phone].pdf Page 8 of 9 Weeks in Treatment: 0 Information Obtained From Patient Cardiovascular Medical History: Positive for: Hypertension Endocrine Medical History: Positive for: Type II Diabetes - A1C 7.1 Time with diabetes: 20 years Treated with: Oral agents Blood sugar tested every day: Yes Tested : Genitourinary Medical History: Past Medical History Notes: High protein Immunizations Pneumococcal Vaccine: Received Pneumococcal Vaccination: No Implantable Devices None Hospitalization / Surgery History Type of Hospitalization/Surgery [facility]- Left Foot Family and Social History Cancer: No; Diabetes: No; Heart Disease: Yes - Father; Hypertension: Yes - Father; Never smoker; Marital Status - Married; Alcohol Use: Moderate; Drug Use: No History; Caffeine Use: Never Electronic Signature(s) Signed: 10/30/2022 4:59:20 PM By: Rosalio Loud MSN RN CNS WTA Signed: 10/31/2022 8:14:16 AM By: Worthy Keeler PA-C Entered By: Rosalio Loud on 10/30/2022 16:10:18 -------------------------------------------------------------------------------- SuperBill Details Patient Name: Date of Service: [name], [name] 10/30/2022 Medical Record Number: [id number] Patient Account Number: [id number] Date of Birth/Sex: Treating RN: [birth year] (56 y.o. [place] Meth Primary Care Provider: [name] Other Clinician: Massie Kluver Referring Provider: Treating Provider/Extender: Griselda Miner Weeks in Treatment: 0 Diagnosis Coding ICD-10 Codes Code Description [phone] Type 2 diabetes mellitus with foot ulcer L97.512 Non-pressure chronic ulcer of other part of right foot with fat layer exposed TALAN, GILDNER ([id number]) [id number] Page 9 of 9 I10 Essential (primary) hypertension Facility Procedures : CPT4 Code: [id number] Description: [phone] -  WOUND CARE VISIT-LEV 5 EST PT Modifier: Quantity: 1 Physician Procedures : CPT4 Code Description Modifier [id number] - WC PHYS LEVEL 4 - EST PT ICD-10 Diagnosis Description E11.621 Type 2 diabetes mellitus with foot ulcer L97.512 Non-pressure chronic ulcer of other part of right foot with fat layer exposed I10 Essential  (primary) hypertension Quantity: 1 Electronic Signature(s) Signed: 10/30/2022 5:04:26 PM By: Worthy Keeler PA-C Previous Signature: 10/30/2022 4:59:20 PM Version By: Rosalio Loud MSN RN CNS WTA Entered By: Worthy Keeler on 10/30/2022 17:04:25

## 2022-10-31 NOTE — Progress Notes (Signed)
TRESTEN, PANTOJA ([id number]) [id number] Page 1 of 12 Visit Report for 10/30/2022 Allergy List Details Patient Name: Date of Service: [name], [name] 10/30/2022 3:45 PM Medical Record Number: [id number] Patient Account Number: [id number] Date of Birth/Sex: Treating RN: Sep 25, 1966 (56 y.o. Seward Meth Primary Care Adithya Difrancesco: [name] Other Clinician: Massie Kluver Referring Ronique Simerly: Treating Denorris Reust/Extender: Griselda Miner Weeks in Treatment: 0 Allergies Active Allergies No Known Drug Allergies Allergy Notes Electronic Signature(s) Signed: 10/30/2022 4:59:20 PM By: Rosalio Loud MSN RN CNS WTA Entered By: Rosalio Loud on 10/30/2022 16:23:33 -------------------------------------------------------------------------------- Arrival Information Details Patient Name: Date of Service: [name], [name] 10/30/2022 3:45 PM Medical Record Number: [id number] Patient Account Number: [id number] Date of Birth/Sex: Treating RN: Apr 23, 1966 (56 y.o. Seward Meth Primary Care Der Gagliano: [name] Other Clinician: Massie Kluver Referring Phylis Javed: Treating Tasman Zapata/Extender: Primitivo Gauze in Treatment: 0 Visit Information Patient Arrived: Ambulatory Arrival Time: 15:48 Accompanied By: wife Transfer Assistance: None Patient Identification Verified: Yes Secondary Verification Process Completed: Yes Patient Requires Transmission-Based Precautions: No Patient Has Alerts: Yes Patient AlertsLANDERS, PRAJAPATI ([id number]) Electronic Signature(s) Signed: 10/30/2022 4:59:20 PM By: Rosalio Loud MSN RN CNS WTA Entered By: Tamala Julian, Vi History Since Last Visit All ordered tests and consults were completed: No Added or deleted any medications: No Any new allergies or adverse reactions: No Had a fall or experienced change in activities of daily living that may affect risk of falls: No Signs or symptoms of abuse/neglect since last  visito No Hospitalized since last visit: No Implantable device outside of the clinic excluding cellular tissue based products placed in the center since last visit: No Pain Present Now: No [id number] Page 2 of 12 cki on 10/30/2022 15:50:31 -------------------------------------------------------------------------------- Clinic Level of Care Assessment Details Patient Name: Date of Service: [name], [name] 10/30/2022 3:45 PM Medical Record Number: [id number] Patient Account Number: [id number] Date of Birth/Sex: Treating RN: [birth year] (56 y.o. Seward Meth Primary Care Rhenda [place]: [name] Other Clinician: Massie Kluver Referring Payden Docter: Treating Tonisha Silvey/Extender: Griselda Miner Weeks in Treatment: 0 Clinic Level of Care Assessment Items TOOL 2 Quantity Score X- 1 0 Use when only an EandM is performed on the INITIAL visit ASSESSMENTS - Nursing Assessment / Reassessment X- 1 20 General Physical Exam (combine w/ comprehensive assessment (listed just below) when performed on new pt. evals) X- 1 25 Comprehensive Assessment (HX, ROS, Risk Assessments, Wounds Hx, etc.) ASSESSMENTS - Wound and Skin A ssessment / Reassessment _0  - 0 Simple Wound Assessment / Reassessment - one wound X- 2 5 Complex Wound Assessment / Reassessment - multiple wounds _1  - 0 Dermatologic / Skin Assessment (not related to wound area) ASSESSMENTS - Ostomy and/or Continence Assessment and Care _2  - 0 Incontinence Assessment and Management _3  - 0 Ostomy Care Assessment and Management (repouching, etc.) PROCESS - Coordination of Care _4  - 0 Simple Patient / Family Education for ongoing care X- 1 20 Complex (extensive) Patient / Family Education for ongoing care X- 1 10 Staff obtains Programmer, systems, Records, T Results / Process Orders est _5  - 0 Staff telephones HHA, Nursing Homes / Clarify orders / etc _6  - 0 Routine Transfer to another Facility (non-emergent  condition) _7  - 0 Routine Hospital Admission (non-emergent condition) _8  - 0 New Admissions / Biomedical engineer / Ordering NPWT Apligraf, etc. , _9  - 0 Emergency Hospital Admission (emergent condition) _10  - 0 Simple Discharge Coordination X- 1 15 Complex (extensive) Discharge Coordination PROCESS - Special Needs _11  -  0 Pediatric / Minor Patient Management _0  - 0 Isolation Patient Management _1  - 0 Hearing / Language / Visual special needs _2  - 0 Assessment of Community assistance (transportation, D/C planning, etc.) _3  - 0 Additional assistance / Altered mentation [name], [name] ([id number]) [id number] Page 3 of 12 _4  - 0 Support Surface(s) Assessment (bed, cushion, seat, etc.) INTERVENTIONS - Wound Cleansing / Measurement X- 1 5 Wound Imaging (photographs - any number of wounds) _5  - 0 Wound Tracing (instead of photographs) _6  - 0 Simple Wound Measurement - one wound X- 2 5 Complex Wound Measurement - multiple wounds _7  - 0 Simple Wound Cleansing - one wound X- 2 5 Complex Wound Cleansing - multiple wounds INTERVENTIONS - Wound Dressings _8  - 0 Small Wound Dressing one or multiple wounds X- 2 15 Medium Wound Dressing one or multiple wounds _9  - 0 Large Wound Dressing one or multiple wounds <[id number]>_6<\/[id number]>_37  - 0 Application of Medications - injection INTERVENTIONS - Miscellaneous _11  - 0 External ear exam _12  - 0 Specimen Collection (cultures, biopsies, blood, body fluids, etc.) _13  - 0 Specimen(s) / Culture(s) sent or taken to Lab for analysis _14  - 0 Patient Transfer (multiple staff / Civil Service fast streamer / Similar devices) _15  - 0 Simple Staple / Suture removal (25 or less) _16  - 0 Complex Staple / Suture removal (26 or more) _17  - 0 Hypo / Hyperglycemic Management (close monitor of Blood Glucose) X- 1 15 Ankle / Brachial Index (ABI) - do not check if billed separately Has the patient been seen at the hospital within the last three years:  Yes Total Score: 170 Level Of Care: New/Established - Level 5 Electronic Signature(s) Signed: 10/30/2022 4:59:20 PM By: Rosalio Loud MSN RN CNS WTA Entered By: Rosalio Loud on 10/30/2022 16:35:18 -------------------------------------------------------------------------------- Encounter Discharge Information Details Patient Name: Date of Service: Zachary Cabot. 10/30/2022 3:45 PM Medical Record Number: [id number] Patient Account Number: [id number] Date of Birth/Sex: Treating RN: 1966-06-08 (56 y.o. Seward Meth Primary Care Orvil Faraone: [name] Other Clinician: Massie Kluver Referring Sherlock Nancarrow: Treating Chelbie Jarnagin/Extender: Primitivo Gauze in Treatment: 0 Encounter Discharge Information Items Discharge Condition: Stable Ambulatory Status: Ambulatory Discharge Destination: Home Transportation: Private Auto Accompanied By: wife Schedule Follow-up AppointmentZAEVION, PARKE ([id number]) [id number] Page 4 of 12 Clinical Summary of Care: Electronic Signature(s) Signed: 10/30/2022 4:51:08 PM By: Rosalio Loud MSN RN CNS WTA Entered By: Rosalio Loud on 10/30/2022 16:51:08 -------------------------------------------------------------------------------- Lower Extremity Assessment Details Patient Name: Date of Service: [name], [name] 10/30/2022 3:45 PM Medical Record Number: [id number] Patient Account Number: [id number] Date of Birth/Sex: Treating RN: Nov 05, 1966 (56 y.o. Seward Meth Primary Care Carsen Machi: [name] Other Clinician: Massie Kluver Referring Jayvier Burgher: Treating Ryer Asato/Extender: Griselda Miner Weeks in Treatment: 0 Vascular Assessment Blood Pressure: Brachial: [Right:160] Ankle: [Right:Posterior Tibial: 160 1.00] Electronic Signature(s) Signed: 10/30/2022 4:59:20 PM By: Rosalio Loud MSN RN CNS WTA Entered By: Rosalio Loud on 10/30/2022  16:23:25 -------------------------------------------------------------------------------- Multi Wound Chart Details Patient Name: Date of Service: [name], [name] 10/30/2022 3:45 PM Medical Record Number: [id number] Patient Account Number: [id number] Date of Birth/Sex: Treating RN: Nov 28, 1966 (56 y.o. Seward Meth Primary Care Byron Peacock: [name] Other Clinician: Massie Kluver Referring Blonnie Maske: Treating Abigail Marsiglia/Extender: Griselda Miner Weeks in Treatment: 0 Vital Signs Height(in): 72 Pulse(bpm): 83 Weight(lbs): 190 Blood Pressure(mmHg): 178/81 Body Mass Index(BMI): 25.8 Temperature(F): 98.0 Respiratory Rate(breaths/min): 16 [2:Photos:] [N/A:N/A [id number] Page 5 of 12] Right, Lateral Foot Right, Plantar Foot N/A Wound Location: Gradually Appeared  Gradually Appeared N/A Wounding Event: Diabetic Wound/Ulcer of the Lower Diabetic Wound/Ulcer of the Lower N/A Primary Etiology: Extremity Extremity Hypertension, Type II Diabetes Hypertension, Type II Diabetes N/A Comorbid History: 08/13/2022 08/21/2022 N/A Date Acquired: 0 0 N/A Weeks of Treatment: Open Open N/A Wound Status: No No N/A Wound Recurrence: 1.5x1.5x0.4 0.7x0.5x0.2 N/A Measurements Dillon x W x D (cm) 1.767 0.275 N/A A (cm) : rea 0.707 0.055 N/A Volume (cm) : 8 11 Starting Position 1 (o'clock): 7 1 Ending Position 1 (o'clock): 1 0.3 Maximum Distance 1 (cm): Yes Yes N/A Undermining: Grade 2 Grade 1 N/A Classification: Medium Medium N/A Exudate A mount: Serous Serosanguineous N/A Exudate Type: amber red, brown N/A Exudate Color: Yes No N/A Foul Odor A Cleansing: fter No N/A N/A Odor A nticipated Due to Product Use: Small (1-33%) Small (1-33%) N/A Granulation A mount: Red, Pink Red, Pink N/A Granulation Quality: Small (1-33%) Small (1-33%) N/A Necrotic A mount: Fat Layer (Subcutaneous Tissue): Yes Fat Layer (Subcutaneous Tissue): Yes  N/A Exposed Structures: Muscle: Yes Fascia: No Bone: Yes Tendon: No Fascia: No Muscle: No Tendon: No Joint: No Joint: No Bone: No Medium (34-66%) Small (1-33%) N/A Epithelialization: Treatment Notes Wound #2 (Foot) Wound Laterality: Right, Lateral Cleanser Byram Ancillary Kit - 15 Day Supply Discharge Instruction: Use supplies as instructed; Kit contains: (15) Saline Bullets; (15) 3x3 Gauze; 15 pr Gloves Wound Cleanser Discharge Instruction: Wash your hands with soap and water. Remove old dressing, discard into plastic bag and place into trash. Cleanse the wound with Wound Cleanser prior to applying a clean dressing using gauze sponges, not tissues or cotton balls. Do not scrub or use excessive force. Pat dry using gauze sponges, not tissue or cotton balls. Peri-Wound Care Topical Primary Dressing IODOFLEX 0.9% Cadexomer Iodine Pad Discharge Instruction: Apply Iodoflex to wound bed only as directed. Secondary Dressing ABD Pad 5x9 (in/in) Discharge Instruction: Cover with ABD pad Secured With Kerlix Roll Sterile or Non-Sterile 6-ply 4.5x4 (yd/yd) Discharge Instruction: Apply Kerlix as directed Compression Wrap Compression Stockings Add-Ons Wound #3 (Foot) Wound Laterality: Plantar, Right Cleanser Byram Ancillary Kit - [address] ILIAS, STCHARLES ([id number]) [id number] Page 6 of 12 Discharge Instruction: Use supplies as instructed; Kit contains: (15) Saline Bullets; (15) 3x3 Gauze; 15 pr Gloves Wound Cleanser Discharge Instruction: Wash your hands with soap and water. Remove old dressing, discard into plastic bag and place into trash. Cleanse the wound with Wound Cleanser prior to applying a clean dressing using gauze sponges, not tissues or cotton balls. Do not scrub or use excessive force. Pat dry using gauze sponges, not tissue or cotton balls. Peri-Wound Care Topical Primary Dressing IODOFLEX 0.9% Cadexomer Iodine Pad Discharge  Instruction: Apply Iodoflex to wound bed only as directed. Secondary Dressing ABD Pad 5x9 (in/in) Discharge Instruction: Cover with ABD pad Secured With Kerlix Roll Sterile or Non-Sterile 6-ply 4.5x4 (yd/yd) Discharge Instruction: Apply Kerlix as directed Compression Wrap Compression Stockings Add-Ons Electronic Signature(s) Signed: 10/30/2022 4:50:24 PM By: Rosalio Loud MSN RN CNS WTA Entered By: Rosalio Loud on 10/30/2022 16:50:24 -------------------------------------------------------------------------------- [place] Details Patient Name: Date of Service: [name], [name] 10/30/2022 3:45 PM Medical Record Number: [id number] Patient Account Number: [id number] Date of Birth/Sex: Treating RN: [birth year] (56 y.o. Seward Meth Primary Care Bennie Scaff: [name] Other Clinician: Massie Kluver Referring Ezri Fanguy: Treating Zhion Pevehouse/Extender: Griselda Miner Weeks in Treatment: 0 Active Inactive Necrotic Tissue Nursing Diagnoses: Impaired tissue integrity related to necrotic/devitalized tissue Knowledge deficit related to management of necrotic/devitalized tissue Goals:  Necrotic/devitalized tissue will be minimized in the wound bed Date Initiated: 10/30/2022 Target Resolution Date: 11/29/2022 Goal Status: Active Patient/caregiver will verbalize understanding of reason and process for debridement of necrotic tissue Date Initiated: 10/30/2022 Target Resolution Date: 11/29/2022 Goal Status: Active Interventions: Assess patient pain level pre-, during and post procedure and prior to discharge [name], [name] ([id number]) [id number] Page 7 of 12 Provide education on necrotic tissue and debridement process Treatment Activities: Apply topical anesthetic as ordered : 10/30/2022 Excisional debridement : 10/30/2022 Notes: Orientation to the Wound Care Program Nursing Diagnoses: Knowledge deficit related to the wound  healing center program Goals: Patient/caregiver will verbalize understanding of the [place] Program Date Initiated: 10/30/2022 Target Resolution Date: 11/18/2022 Goal Status: Active Interventions: Provide education on orientation to the wound center Notes: Wound/Skin Impairment Nursing Diagnoses: Impaired tissue integrity Knowledge deficit related to ulceration/compromised skin integrity Goals: Patient will have a decrease in wound volume by X% from date: (specify in notes) Date Initiated: 10/30/2022 Target Resolution Date: 11/29/2022 Goal Status: Active Patient/caregiver will verbalize understanding of skin care regimen Date Initiated: 10/30/2022 Target Resolution Date: 11/29/2022 Goal Status: Active Ulcer/skin breakdown will have a volume reduction of 30% by week 4 Date Initiated: 10/30/2022 Target Resolution Date: 11/29/2022 Goal Status: Active Ulcer/skin breakdown will have a volume reduction of 50% by week 8 Date Initiated: 10/30/2022 Target Resolution Date: 12/30/2022 Goal Status: Active Ulcer/skin breakdown will have a volume reduction of 80% by week 12 Date Initiated: 10/30/2022 Target Resolution Date: 01/30/2023 Goal Status: Active Ulcer/skin breakdown will heal within 14 weeks Date Initiated: 10/30/2022 Target Resolution Date: 02/13/2023 Goal Status: Active Interventions: Assess patient/caregiver ability to obtain necessary supplies Assess patient/caregiver ability to perform ulcer/skin care regimen upon admission and as needed Assess ulceration(s) every visit Provide education on ulcer and skin care Treatment Activities: Referred to DME Samari Bittinger for dressing supplies : 10/30/2022 Skin care regimen initiated : 10/30/2022 Notes: Electronic Signature(s) Signed: 10/30/2022 4:50:14 PM By: Rosalio Loud MSN RN CNS WTA Entered By: Rosalio Loud on 10/30/2022 16:50:13 [name], [name] ([id number]) [id number] Page 8 of  12 -------------------------------------------------------------------------------- Pain Assessment Details Patient Name: Date of Service: [name], [name] 10/30/2022 3:45 PM Medical Record Number: [id number] Patient Account Number: [id number] Date of Birth/Sex: Treating RN: 1966-09-25 (56 y.o. Seward Meth Primary Care Avaiyah Strubel: [name] Other Clinician: Massie Kluver Referring Faatimah Spielberg: Treating Charm Stenner/Extender: Griselda Miner Weeks in Treatment: 0 Active Problems Location of Pain Severity and Description of Pain Patient Has Paino No Site Locations Pain Management and Medication Current Pain Management: Electronic Signature(s) Signed: 10/30/2022 4:59:20 PM By: Rosalio Loud MSN RN CNS WTA Entered By: Rosalio Loud on 10/30/2022 15:50:43 -------------------------------------------------------------------------------- Patient/Caregiver Education Details Patient Name: Date of Service: Zachary Cabot 11/20/2023andnbsp3:45 PM Medical Record Number: [id number] Patient Account Number: [id number] Date of Birth/Gender: Treating RN: [birth year] (56 y.o. Seward Meth Primary Care Physician: [name] Other Clinician: Massie Kluver Referring Physician: Treating Physician/Extender: Griselda Miner Weeks in Treatment: [place], [place] ([id number]) [phone].pdf Page 9 of 12 Education Assessment Education Provided To: Patient Education Topics Provided Wound/Skin Impairment: Handouts: Caring for Your Ulcer Methods: Explain/Verbal Responses: State content correctly Electronic Signature(s) Signed: 10/30/2022 4:59:20 PM By: Rosalio Loud MSN RN CNS WTA Entered By: Rosalio Loud on 10/30/2022 16:35:53 -------------------------------------------------------------------------------- Wound Assessment Details Patient Name: Date of Service: [name], [name] 10/30/2022 3:45 PM Medical Record Number: [id number] Patient Account  Number: [id number] Date of Birth/Sex: Treating RN: October 03, 1966 (56 y.o. Seward Meth Primary Care  Emmalou Hunger: [name] Other Clinician: Massie Kluver Referring Rheannon Cerney: Treating Lorayne Getchell/Extender: Griselda Miner Weeks in Treatment: 0 Wound Status Wound Number: 2 Primary Etiology: Diabetic Wound/Ulcer of the Lower Extremity Wound Location: Right, Lateral Foot Wound Status: Open Wounding Event: Gradually Appeared Comorbid History: Hypertension, Type II Diabetes Date Acquired: 08/13/2022 Weeks Of Treatment: 0 Clustered Wound: No Photos Wound Measurements Length: (cm) 1.5 Width: (cm) 1.5 Depth: (cm) 0.4 Area: (cm) 1.767 Volume: (cm) 0.707 [place], Jayston Dillon ([id number]) Wound Description Classification: G Exudate Amount: M Exudate Type: S Exudate Color: a Foul Odor After Cleansing: Due to Product Use: Slough/Fibrino % Reduction in Area: % Reduction in Volume: Epithelialization: Medium (34-66%) Tunneling: No Undermining: Yes Starting Position (o'clock): 8 Ending Position (o'clock): 7 Maximum Distance: (cm) 1 [id number].pdf Page 10 of 12 rade 2 edium erous mber Yes No Yes Wound Bed Granulation Amount: Small (1-33%) Exposed Structure Granulation Quality: Red, Pink Fascia Exposed: No Necrotic Amount: Small (1-33%) Fat Layer (Subcutaneous Tissue) Exposed: Yes Necrotic Quality: Adherent Slough Tendon Exposed: No Muscle Exposed: Yes Necrosis of Muscle: Yes Joint Exposed: No Bone Exposed: Yes Treatment Notes Wound #2 (Foot) Wound Laterality: Right, Lateral Cleanser Byram Ancillary Kit - 15 Day Supply Discharge Instruction: Use supplies as instructed; Kit contains: (15) Saline Bullets; (15) 3x3 Gauze; 15 pr Gloves Wound Cleanser Discharge Instruction: Wash your hands with soap and water. Remove old dressing, discard into plastic bag and place into trash. Cleanse the wound with Wound Cleanser prior to applying a clean dressing  using gauze sponges, not tissues or cotton balls. Do not scrub or use excessive force. Pat dry using gauze sponges, not tissue or cotton balls. Peri-Wound Care Topical Primary Dressing IODOFLEX 0.9% Cadexomer Iodine Pad Discharge Instruction: Apply Iodoflex to wound bed only as directed. Secondary Dressing ABD Pad 5x9 (in/in) Discharge Instruction: Cover with ABD pad Secured With Kerlix Roll Sterile or Non-Sterile 6-ply 4.5x4 (yd/yd) Discharge Instruction: Apply Kerlix as directed Compression Wrap Compression Stockings Add-Ons Electronic Signature(s) Signed: 10/30/2022 4:59:20 PM By: Rosalio Loud MSN RN CNS WTA Entered By: Rosalio Loud on 10/30/2022 16:27:31 -------------------------------------------------------------------------------- Wound Assessment Details Patient Name: Date of Service: [name], [name] 10/30/2022 3:45 PM Medical Record Number: [id number] Patient Account Number: [id number] Date of Birth/Sex: Treating RN: [birth year] (56 y.o. Seward Meth Primary Care Byron Tipping: [name] Other Clinician: Massie Kluver Referring Adelynn Gipe: Treating Jourdin Gens/Extender: Griselda Miner Weeks in Treatment: [place], [place] ([id number]) [phone].pdf Page 11 of 12 Wound Status Wound Number: 3 Primary Etiology: Diabetic Wound/Ulcer of the Lower Extremity Wound Location: Right, Plantar Foot Wound Status: Open Wounding Event: Gradually Appeared Comorbid History: Hypertension, Type II Diabetes Date Acquired: 08/21/2022 Weeks Of Treatment: 0 Clustered Wound: No Photos Wound Measurements Length: (cm) 0.7 Width: (cm) 0.5 Depth: (cm) 0.2 Area: (cm) 0.275 Volume: (cm) 0.055 % Reduction in Area: % Reduction in Volume: Epithelialization: Small (1-33%) Tunneling: No Undermining: Yes Starting Position (o'clock): 11 Ending Position (o'clock): 1 Maximum Distance: (cm) 0.3 Wound Description Classification: Grade 1 Exudate Amount:  Medium Exudate Type: Serosanguineous Exudate Color: red, brown Foul Odor After Cleansing: No Slough/Fibrino Yes Wound Bed Granulation Amount: Small (1-33%) Exposed Structure Granulation Quality: Red, Pink Fascia Exposed: No Necrotic Amount: Small (1-33%) Fat Layer (Subcutaneous Tissue) Exposed: Yes Necrotic Quality: Adherent Slough Tendon Exposed: No Muscle Exposed: No Joint Exposed: No Bone Exposed: No Treatment Notes Wound #3 (Foot) Wound Laterality: Plantar, Right Cleanser Byram Ancillary Kit - 15 Day Supply Discharge Instruction: Use supplies as instructed; Kit contains: (15) Saline Bullets; (15)  3x3 Gauze; 15 pr Gloves Wound Cleanser Discharge Instruction: Wash your hands with soap and water. Remove old dressing, discard into plastic bag and place into trash. Cleanse the wound with Wound Cleanser prior to applying a clean dressing using gauze sponges, not tissues or cotton balls. Do not scrub or use excessive force. Pat dry using gauze sponges, not tissue or cotton balls. Peri-Wound Care Topical Primary Dressing IODOFLEX 0.9% Cadexomer Iodine Pad Discharge Instruction: Apply Iodoflex to wound bed only as directed. Secondary Dressing ABD Pad 5x9 (in/in) Discharge Instruction: Cover with ABD pad Secured With BRINTON, BRANDEL ([id number]) [id number].pdf Page 12 of 12 Kerlix Roll Sterile or Non-Sterile 6-ply 4.5x4 (yd/yd) Discharge Instruction: Apply Kerlix as directed Compression Wrap Compression Stockings Add-Ons Electronic Signature(s) Signed: 10/30/2022 4:59:20 PM By: Rosalio Loud MSN RN CNS WTA Entered By: Rosalio Loud on 10/30/2022 16:27:54 -------------------------------------------------------------------------------- [place] Details Patient Name: Date of Service: Zachary Cabot. 10/30/2022 3:45 PM Medical Record Number: [id number] Patient Account Number: [id number] Date of Birth/Sex: Treating RN: January 07, 1966 (56 y.o. Seward Meth Primary  Care Rayvon Brandvold: [name] Other Clinician: Massie Kluver Referring Yukie Bergeron: Treating Deny Chevez/Extender: Griselda Miner Weeks in Treatment: 0 Vital Signs Time Taken: 15:50 Temperature (F): 98.0 Height (in): 72 Pulse (bpm): 83 Source: Stated Respiratory Rate (breaths/min): 16 Weight (lbs): 190 Blood Pressure (mmHg): 178/81 Body Mass Index (BMI): 25.8 Reference Range: 80 - 120 mg / dl Electronic Signature(s) Signed: 10/30/2022 4:59:20 PM By: Rosalio Loud MSN RN CNS WTA Entered By: Rosalio Loud on 10/30/2022 15:53:02

## 2022-11-01 ENCOUNTER — Ambulatory Visit: Payer: Managed Care, Other (non HMO) | Admitting: Internal Medicine

## 2022-11-06 ENCOUNTER — Other Ambulatory Visit: Payer: Self-pay | Admitting: Physician Assistant

## 2022-11-06 ENCOUNTER — Other Ambulatory Visit: Payer: Self-pay | Admitting: Podiatry

## 2022-11-06 ENCOUNTER — Encounter: Payer: Managed Care, Other (non HMO) | Admitting: Physician Assistant

## 2022-11-06 DIAGNOSIS — E11621 Type 2 diabetes mellitus with foot ulcer: Secondary | ICD-10-CM | POA: Diagnosis not present

## 2022-11-06 DIAGNOSIS — M86671 Other chronic osteomyelitis, right ankle and foot: Secondary | ICD-10-CM

## 2022-11-06 DIAGNOSIS — L97512 Non-pressure chronic ulcer of other part of right foot with fat layer exposed: Secondary | ICD-10-CM

## 2022-11-06 NOTE — Telephone Encounter (Signed)
New order placed.  Please notify patient.  Thanks, Dr. Amalia Hailey

## 2022-11-06 NOTE — Progress Notes (Signed)
[name], [name] ([id number]) [id number] Page 1 of 9 Visit Report for 11/06/2022 Chief Complaint Document Details Patient Name: Date of Service: [name], [name] 11/06/2022 3:45 PM Medical Record Number: [id number] Patient Account Number: [id number] Date of Birth/Sex: Treating RN: 11-16-1966 (56 y.o. [name]) [name] Primary Care Provider: Steele Dillon Other Clinician: Massie Dillon Referring Provider: Treating Provider/Extender: [name] Weeks in Treatment: 1 Information Obtained from: Patient Chief Complaint Right foot ulcers Electronic Signature(s) Signed: 11/06/2022 3:52:55 PM By: Worthy Keeler PA-C Entered By: Worthy Keeler on 11/06/2022 15:52:54 -------------------------------------------------------------------------------- Debridement Details Patient Name: Date of Service: [name] 11/06/2022 3:45 PM Medical Record Number: [id number] Patient Account Number: [id number] Date of Birth/Sex: Treating RN: 06-19-1966 (56 y.o. [name] Primary Care Provider: Steele Dillon Other Clinician: Massie Dillon Referring Provider: Treating Provider/Extender: [name] Weeks in Treatment: 1 Debridement Performed for Assessment: Wound #3 Right,Plantar Foot Performed By: Physician Zachary Sams., PA-C Debridement Type: Debridement Severity of Tissue Pre Debridement: Necrosis of muscle Level of Consciousness (Pre-procedure): Awake and Alert Pre-procedure Verification/Time Out Yes - 16:28 Taken: Start Time: 16:28 T Area Debrided (L x W): otal 1 (cm) x 1 (cm) = 1 (cm) Tissue and other material debrided: Viable, Non-Viable, Callus, Slough, Subcutaneous, Slough Level: Skin/Subcutaneous Tissue Debridement Description: Excisional Instrument: Curette Bleeding: Moderate Hemostasis Achieved: Pressure Response to Treatment: Procedure was tolerated well Level of Consciousness (Post- Awake and  Alert procedure): [name], [name] ([id number]) [id number] Page 2 of 9 Post Debridement Measurements of Total Wound Length: (cm) 0.9 Width: (cm) 0.7 Depth: (cm) 0.3 Volume: (cm) 0.148 Character of Wound/Ulcer Post Debridement: Stable Severity of Tissue Post Debridement: Necrosis of muscle Post Procedure Diagnosis Same as Pre-procedure Electronic Signature(s) Signed: 11/06/2022 4:57:16 PM By: Worthy Keeler PA-C Signed: 11/06/2022 5:11:01 PM By: [name] Signed: 11/07/2022 4:00:23 PM By: Zachary Coria RN Entered By: [name] on 11/06/2022 16:29:24 -------------------------------------------------------------------------------- Debridement Details Patient Name: Date of Service: Zachary Cabot. 11/06/2022 3:45 PM Medical Record Number: [id number] Patient Account Number: [id number] Date of Birth/Sex: Treating RN: 1966/02/04 (56 y.o. [name] Primary Care Provider: Steele Dillon Other Clinician: Massie Dillon Referring Provider: Treating Provider/Extender: [name] Weeks in Treatment: 1 Debridement Performed for Assessment: Wound #2 Right,Lateral Foot Performed By: Physician Zachary Sams., PA-C Debridement Type: Debridement Severity of Tissue Pre Debridement: Bone involvement without necrosis Level of Consciousness (Pre-procedure): Awake and Alert Pre-procedure Verification/Time Out Yes - 16:21 Taken: Start Time: 16:21 T Area Debrided (L x W): otal 2.5 (cm) x 3 (cm) = 7.5 (cm) Tissue and other material debrided: Viable, Non-Viable, Bone, Callus, Slough, Subcutaneous, Slough Level: Skin/Subcutaneous Tissue/Muscle/Bone Debridement Description: Excisional Instrument: Curette Specimen: Tissue Culture Number of Specimens T aken: 1 Bleeding: Moderate Hemostasis Achieved: Pressure Response to Treatment: Procedure was tolerated well Level of Consciousness (Post- Awake and Alert procedure): Post Debridement  Measurements of Total Wound Length: (cm) 2 Width: (cm) 2 Depth: (cm) 0.4 Volume: (cm) 1.257 Character of Wound/Ulcer Post Debridement: Stable Severity of Tissue Post Debridement: Bone involvement without necrosis Post Procedure Diagnosis Same as Pre-procedure Notes sample of bone sent for pathology [name], [name] ([id number]) [id number] Page 3 of 9 Electronic Signature(s) Signed: 11/06/2022 4:57:16 PM By: Worthy Keeler PA-C Signed: 11/06/2022 5:11:01 PM By: [name] Signed: 11/07/2022 4:00:23 PM By: Zachary Coria RN Entered By: [name] on 11/06/2022 16:30:09 -------------------------------------------------------------------------------- HPI Details Patient Name: Date of Service: [name], [name] 11/06/2022 3:45 PM Medical Record Number:  [id number] Patient Account Number: [id number] Date of Birth/Sex: Treating RN: 08/26/1966 (56 y.o. [name]) [name] Primary Care Provider: Steele Dillon Other Clinician: Massie Dillon Referring Provider: Treating Provider/Extender: [name] Weeks in Treatment: 1 History of Present Illness HPI Description: ADMISSION 07/07/2020 This is a 56 year old man with type 2 diabetes. He was admitted to hospital from 7/16 through 7/22 having apparently a ulcer on his foot for a prolonged period of time. He was discovered to have an abscess as well as osteomyelitis. This was documented by MRI [see below] he underwent 1/5 metatarsal base resection by Dr. Luana Dillon of podiatry. Intraoperative culture showed MSSA. I believe he and another operative debridement at which time cultures showed Streptococcus mitis and group B strep. In the hospital the patient was treated with Vanco and Zosyn and subsequently Unasyn but he was discharged on Dalavance weekly x5 doses of which he has had 1 dose. He was also reviewed by Dr. Lucky Dillon while he was in hospital. He had an angiogram that is shown below. He has a congenital  abnormality of the vessel flow and may be small vessel disease but was not felt to have anything that required a surgical procedure. The patient was discharged with a wound VAC over the surgical site however no arrangements were made for the wound VAC change. He does not have home health. In epic it shows communication between the infectious disease office and podiatry asking about who would be changing the [facility] and largely he is here for review of the wound and VAC changes. Angiogram 7/21 Aortogram: The renal arteries were widely patent. The aorta and iliac arteries were widely patent without significant stenosis Left lower Extremity: Normal common femoral artery, profunda femoris artery, and superficial femoral and popliteal arteries. The tibials started with a very large vessel going straight down that appeared to be the peroneal proximally and what appeared to be the anterior tibial coming off lateral to this. Following these down to the foot, the large vessel actually coursed medially and the typical course of the posterior tibial artery and was patent into the midfoot where there was small vessel disease at that point. What appeared to be the anterior tibial artery at the takeoff actually coursed as the peroneal artery with a typical termination at the ankle which was feeding some small collaterals into the foot. No true anterior tibial artery was seen. This appeared to be a congenital configuration but no vascular disease requiring treatment was present. MRI 7/16 IMPRESSION: 1. Large area of ulceration over the lateral base the fifth metatarsal with a sinus tract, subcutaneous emphysema and a loculated abscess as described above. 2. Findings suggestive of early osteomyelitis involving the fifth metatarsal base. 3. Mild reactive marrow seen within the anterior calcaneus, cuboid, and lateral cuneiform. Past medical history includes type 2 diabetes, hypertension, hyperlipidemia,  BPH Readmission: 10-30-2022 patient presents today for initial inspection here in our clinic as a referral from Dr. Amalia Hailey. He is actually supposed to have had an MRI even before coming to [place] but apparently has not. He has wounds on the bottom of his foot 1 that is closer to the forefoot and one that is further down more towards the arch and central portion of his foot. He does have fifth ray amputation. With that being said I feel like his the fifth metatarsal gets the most problematic area based on what I am seeing currently and I am concerned about the possibility of osteomyelitis based on what we see. With that  being said I do feel like the patient needs a MRI to be performed soon as possible. We may also need to obtain a bone biopsy and/or culture for confirmation purposes depend on how things proceed. With that being said overall I feel like that the patient's wounds are significant and I think there is a strong possibility that he has osteomyelitis even currently. I think he may be a good hyperbaric oxygen therapy candidate depending on how things proceed. This is definitely a grade 2 diabetic wound at this point possibly grade 3. Patient does have a history of hypertension but really no other major medical problems which is good news. His ABI today was 1. 11-06-2022 upon evaluation today patient's wounds are really doing about the same. I am still pretty concerned about infection here as I discussed with him last time. He has his MRI scheduled for next week on Thursday. That is December 7. In the meantime he is been on the doxycycline I prescribed for him. Also did perform some debridement today and to be honest we were able to get some of the sampling of necrotic bone that I would have sent in for evaluation for osteomyelitis this could help get [place] the big picture along with MRI as far as what exactly is going on. [name], [name] ([id number]) [id number] Page 4 of  9 Electronic Signature(s) Signed: 11/06/2022 4:55:22 PM By: Worthy Keeler PA-C Entered By: Worthy Keeler on 11/06/2022 16:55:22 -------------------------------------------------------------------------------- Physical Exam Details Patient Name: Date of Service: [name], [name] 11/06/2022 3:45 PM Medical Record Number: [id number] Patient Account Number: [id number] Date of Birth/Sex: Treating RN: July 08, 1966 (56 y.o. [name] Primary Care Provider: Steele Dillon Other Clinician: Massie Dillon Referring Provider: Treating Provider/Extender: [name] Weeks in Treatment: 1 Constitutional Well-nourished and well-hydrated in no acute distress. Respiratory normal breathing without difficulty. Psychiatric this patient is able to make decisions and demonstrates good insight into disease process. Alert and Oriented x 3. pleasant and cooperative. Notes Upon inspection patient's wound bed did require sharp debridement at both locations I was able to remove some of the necrotic bone which she tolerated today without complication and postdebridement the wound bed appears to be better that we still have a long ways to go to get this healthy. Electronic Signature(s) Signed: 11/06/2022 4:55:36 PM By: Worthy Keeler PA-C Entered By: Worthy Keeler on 11/06/2022 16:55:36 -------------------------------------------------------------------------------- Physician Orders Details Patient Name: Date of Service: [name], [name] 11/06/2022 3:45 PM Medical Record Number: [id number] Patient Account Number: [id number] Date of Birth/Sex: Treating RN: [birth year] (56 y.o. [name] Primary Care Provider: Steele Dillon Other Clinician: Massie Dillon Referring Provider: Treating Provider/Extender: [name] Weeks in Treatment: 1 Verbal / Phone Orders: No Diagnosis Coding ICD-10 Coding Code Description E11.621 Type 2 diabetes mellitus with foot  ulcer L97.512 Non-pressure chronic ulcer of other part of right foot with fat layer exposed GIULIO, BERTINO ([id number]) [id number] Page 5 of 9 I10 Essential (primary) hypertension Follow-up Appointments Return Appointment in 1 week. Bathing/ Shower/ Hygiene May shower; gently cleanse wound with antibacterial soap, rinse and pat dry prior to dressing wounds Wound Treatment Wound #2 - Foot Wound Laterality: Right, Lateral Cleanser: Byram Ancillary Kit - 15 Day Supply (Generic) 3 x Per Week/30 Days Discharge Instructions: Use supplies as instructed; Kit contains: (15) Saline Bullets; (15) 3x3 Gauze; 15 pr Gloves Cleanser: Wound Cleanser 3 x Per Week/30 Days Discharge Instructions: Wash your hands with soap and water.  Remove old dressing, discard into plastic bag and place into trash. Cleanse the wound with Wound Cleanser prior to applying a clean dressing using gauze sponges, not tissues or cotton balls. Do not scrub or use excessive force. Pat dry using gauze sponges, not tissue or cotton balls. Prim Dressing: IODOFLEX 0.9% Cadexomer Iodine Pad (Dispense As Written) 3 x Per Week/30 Days ary Discharge Instructions: Apply Iodoflex to wound bed only as directed. Secondary Dressing: ABD Pad 5x9 (in/in) 3 x Per Week/30 Days Discharge Instructions: Cover with ABD pad Secured With: Kerlix Roll Sterile or Non-Sterile 6-ply 4.5x4 (yd/yd) 3 x Per Week/30 Days Discharge Instructions: Apply Kerlix as directed Wound #3 - Foot Wound Laterality: Plantar, Right Cleanser: Byram Ancillary Kit - 15 Day Supply (Generic) 3 x Per Week/30 Days Discharge Instructions: Use supplies as instructed; Kit contains: (15) Saline Bullets; (15) 3x3 Gauze; 15 pr Gloves Cleanser: Wound Cleanser 3 x Per Week/30 Days Discharge Instructions: Wash your hands with soap and water. Remove old dressing, discard into plastic bag and place into trash. Cleanse the wound with Wound Cleanser prior to applying a  clean dressing using gauze sponges, not tissues or cotton balls. Do not scrub or use excessive force. Pat dry using gauze sponges, not tissue or cotton balls. Prim Dressing: IODOFLEX 0.9% Cadexomer Iodine Pad (Dispense As Written) 3 x Per Week/30 Days ary Discharge Instructions: Apply Iodoflex to wound bed only as directed. Secondary Dressing: ABD Pad 5x9 (in/in) 3 x Per Week/30 Days Discharge Instructions: Cover with ABD pad Secured With: Kerlix Roll Sterile or Non-Sterile 6-ply 4.5x4 (yd/yd) 3 x Per Week/30 Days Discharge Instructions: Apply Kerlix as directed Laboratory Bacteria identified in Tissue by Biopsy culture (MICRO) - sample of bone sent for pathology right lateral foot LOINC Code: 65784-6 Convenience Name: Biopsy specimen culture Electronic Signature(s) Signed: 11/06/2022 4:57:16 PM By: Worthy Keeler PA-C Signed: 11/06/2022 5:11:01 PM By: [name] Entered By: [name] on 11/06/2022 16:31:41 -------------------------------------------------------------------------------- Problem List Details Patient Name: Date of Service: [name], Zachary NDY L. 11/06/2022 3:45 PM [name] ([id number]) [id number] Page 6 of 9 Medical Record Number: [id number] Patient Account Number: [id number] Date of Birth/Sex: Treating RN: 1966/06/25 (56 y.o. [name]) [name] Primary Care Provider: Steele Dillon Other Clinician: Massie Dillon Referring Provider: Treating Provider/Extender: [name] Weeks in Treatment: 1 Active Problems ICD-10 Encounter Code Description Active Date MDM Diagnosis E11.621 Type 2 diabetes mellitus with foot ulcer 10/30/2022 No Yes L97.512 Non-pressure chronic ulcer of other part of right foot with fat layer exposed 10/30/2022 No Yes I10 Essential (primary) hypertension 10/30/2022 No Yes Inactive Problems Resolved Problems Electronic Signature(s) Signed: 11/06/2022 3:52:39 PM By: Worthy Keeler  PA-C Entered By: Worthy Keeler on 11/06/2022 15:52:38 -------------------------------------------------------------------------------- Progress Note Details Patient Name: Date of Service: [name], [name] 11/06/2022 3:45 PM Medical Record Number: [id number] Patient Account Number: [id number] Date of Birth/Sex: Treating RN: 30-Jul-1966 (56 y.o. [name] Primary Care Provider: Steele Dillon Other Clinician: Massie Dillon Referring Provider: Treating Provider/Extender: [name] Weeks in Treatment: 1 Subjective Chief Complaint Information obtained from Patient Right foot ulcers History of Present Illness (HPI) ADMISSION 07/07/2020 This is a 56 year old man with type 2 diabetes. He was admitted to hospital from 7/16 through 7/22 having apparently a ulcer on his foot for a prolonged period of time. He was discovered to have an abscess as well as osteomyelitis. This was documented by MRI [see below] he underwent 1/5 metatarsal base resection by Dr. Luana Dillon of podiatry.  Intraoperative culture showed MSSA. I believe he and another operative debridement at which time cultures showed Streptococcus mitis and group B strep. In the hospital the patient was treated with Vanco and Zosyn and subsequently Unasyn but he was discharged on Dalavance weekly x5 doses of which he has had 1 dose. He was also reviewed by Dr. Lucky Dillon while he was in hospital. He had an angiogram that is shown below. 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 The patient was discharged with a wound VAC over the surgical site however no arrangements were made for the wound VAC change. He does not have home health. In epic it shows communication between the infectious disease office and podiatry asking about who would be changing the [facility] and largely he is here for review of the wound and VAC  changes. [name], [name] ([id number]) [id number] Page 7 of 9 Angiogram 7/21 Aortogram: The renal arteries were widely patent. The aorta and iliac arteries were widely patent without significant stenosis Left lower Extremity: Normal common femoral artery, profunda femoris artery, and superficial femoral and popliteal arteries. The tibials started with a very large vessel going straight down that appeared to be the peroneal proximally and what appeared to be the anterior tibial coming off lateral to this. Following these down to the foot, the large vessel actually coursed medially and the typical course of the posterior tibial artery and was patent into the midfoot where there was small vessel disease at that point. What appeared to be the anterior tibial artery at the takeoff actually coursed as the peroneal artery with a typical termination at the ankle which was feeding some small collaterals into the foot. No true anterior tibial artery was seen. This appeared to be a congenital configuration but no vascular disease requiring treatment was present. MRI 7/16 IMPRESSION: 1. Large area of ulceration over the lateral base the fifth metatarsal with a sinus tract, subcutaneous emphysema and a loculated abscess as described above. 2. Findings suggestive of early osteomyelitis involving the fifth metatarsal base. 3. Mild reactive marrow seen within the anterior calcaneus, cuboid, and lateral cuneiform. Past medical history includes type 2 diabetes, hypertension, hyperlipidemia, BPH Readmission: 10-30-2022 patient presents today for initial inspection here in our clinic as a referral from Dr. Amalia Hailey. He is actually supposed to have had an MRI even before coming to [place] but apparently has not. He has wounds on the bottom of his foot 1 that is closer to the forefoot and one that is further down more towards the arch and central portion of his foot. He does have fifth ray  amputation. With that being said I feel like his the fifth metatarsal gets the most problematic area based on what I am seeing currently and I am concerned about the possibility of osteomyelitis based on what we see. With that being said I do feel like the patient needs a MRI to be performed soon as possible. We may also need to obtain a bone biopsy and/or culture for confirmation purposes depend on how things proceed. With that being said overall I feel like that the patient's wounds are significant and I think there is a strong possibility that he has osteomyelitis even currently. I think he may be a good hyperbaric oxygen therapy candidate depending on how things proceed. This is definitely a grade 2 diabetic wound at this point possibly grade 3. Patient does  have a history of hypertension but really no other major medical problems which is good news. His ABI today was 1. 11-06-2022 upon evaluation today patient's wounds are really doing about the same. I am still pretty concerned about infection here as I discussed with him last time. He has his MRI scheduled for next week on Thursday. That is December 7. In the meantime he is been on the doxycycline I prescribed for him. Also did perform some debridement today and to be honest we were able to get some of the sampling of necrotic bone that I would have sent in for evaluation for osteomyelitis this could help get [place] the big picture along with MRI as far as what exactly is going on. Objective Constitutional Well-nourished and well-hydrated in no acute distress. Vitals Time Taken: 3:37 PM, Height: 72 in, Weight: 190 lbs, BMI: 25.8, Temperature: 98.6 F, Pulse: 93 bpm, Respiratory Rate: 16 breaths/min, Blood Pressure: 172/100 mmHg. Respiratory normal breathing without difficulty. Psychiatric this patient is able to make decisions and demonstrates good insight into disease process. Alert and Oriented x 3. pleasant and cooperative. General Notes:  Upon inspection patient's wound bed did require sharp debridement at both locations I was able to remove some of the necrotic bone which she tolerated today without complication and postdebridement the wound bed appears to be better that we still have a long ways to go to get this healthy. Integumentary (Hair, Skin) Wound #2 status is Open. Original cause of wound was Gradually Appeared. The date acquired was: 08/13/2022. The wound has been in treatment 1 weeks. The wound is located on the Right,Lateral Foot. The wound measures 1.9cm length x 2cm width x 0.4cm depth; 2.985cm^2 area and 1.194cm^3 volume. There is bone, muscle, and Fat Layer (Subcutaneous Tissue) exposed. There is no tunneling or undermining noted. There is a medium amount of serous drainage noted. Foul odor after cleansing was noted. There is small (1-33%) red, pink granulation within the wound bed. There is a small (1-33%) amount of necrotic tissue within the wound bed including Adherent Slough and Necrosis of Muscle. Wound #3 status is Open. Original cause of wound was Gradually Appeared. The date acquired was: 08/21/2022. The wound has been in treatment 1 weeks. The wound is located on the [place]. The wound measures 0.9cm length x 0.7cm width x 0.2cm depth; 0.495cm^2 area and 0.099cm^3 volume. There is Fat Layer (Subcutaneous Tissue) exposed. There is no tunneling or undermining noted. There is a medium amount of serosanguineous drainage noted. There is small (1-33%) red, pink granulation within the wound bed. There is a small (1-33%) amount of necrotic tissue within the wound bed including Adherent Slough. Assessment Active Problems ICD-10 Type 2 diabetes mellitus with foot ulcer Non-pressure chronic ulcer of other part of right foot with fat layer exposed Essential (primary) hypertension [name], MCBAIN ([id number]) [id number] Page 8 of 9 Procedures Wound #2 Pre-procedure diagnosis of  Wound #2 is a Diabetic Wound/Ulcer of the Lower Extremity located on the Right,Lateral Foot .Severity of Tissue Pre Debridement is: Bone involvement without necrosis. There was a Excisional Skin/Subcutaneous Tissue/Muscle/Bone Debridement with a total area of 7.5 sq cm performed by Zachary Sams., PA-C. With the following instrument(s): Curette to remove Viable and Non-Viable tissue/material. Material removed includes Bone,Callus, Subcutaneous Tissue, and Slough. 1 specimen was taken by a Tissue Culture and sent to the lab per facility protocol. A time out was conducted at 16:21, prior to the start of the procedure. A Moderate amount of bleeding  was controlled with Pressure. The procedure was tolerated well. Post Debridement Measurements: 2cm length x 2cm width x 0.4cm depth; 1.257cm^3 volume. Character of Wound/Ulcer Post Debridement is stable. Severity of Tissue Post Debridement is: Bone involvement without necrosis. Post procedure Diagnosis Wound #2: Same as Pre-Procedure General Notes: sample of bone sent for pathology. Wound #3 Pre-procedure diagnosis of Wound #3 is a Diabetic Wound/Ulcer of the Lower Extremity located on the [place] .Severity of Tissue Pre Debridement is: Necrosis of muscle. There was a Excisional Skin/Subcutaneous Tissue Debridement with a total area of 1 sq cm performed by Zachary Sams., PA-C. With the following instrument(s): Curette to remove Viable and Non-Viable tissue/material. Material removed includes Callus, Subcutaneous Tissue, and Slough. A time out was conducted at 16:28, prior to the start of the procedure. A Moderate amount of bleeding was controlled with Pressure. The procedure was tolerated well. Post Debridement Measurements: 0.9cm length x 0.7cm width x 0.3cm depth; 0.148cm^3 volume. Character of Wound/Ulcer Post Debridement is stable. Severity of Tissue Post Debridement is: Necrosis of muscle. Post procedure Diagnosis Wound #3: Same as  Pre-Procedure Plan Follow-up Appointments: Return Appointment in 1 week. Bathing/ Shower/ Hygiene: May shower; gently cleanse wound with antibacterial soap, rinse and pat dry prior to dressing wounds Laboratory ordered were: Biopsy specimen culture - sample of bone sent for pathology right lateral foot WOUND #2: - Foot Wound Laterality: Right, Lateral Cleanser: Byram Ancillary Kit - 15 Day Supply (Generic) 3 x Per Week/30 Days Discharge Instructions: Use supplies as instructed; Kit contains: (15) Saline Bullets; (15) 3x3 Gauze; 15 pr Gloves Cleanser: Wound Cleanser 3 x Per Week/30 Days Discharge Instructions: Wash your hands with soap and water. Remove old dressing, discard into plastic bag and place into trash. Cleanse the wound with Wound Cleanser prior to applying a clean dressing using gauze sponges, not tissues or cotton balls. Do not scrub or use excessive force. Pat dry using gauze sponges, not tissue or cotton balls. Prim Dressing: IODOFLEX 0.9% Cadexomer Iodine Pad (Dispense As Written) 3 x Per Week/30 Days ary Discharge Instructions: Apply Iodoflex to wound bed only as directed. Secondary Dressing: ABD Pad 5x9 (in/in) 3 x Per Week/30 Days Discharge Instructions: Cover with ABD pad Secured With: Kerlix Roll Sterile or Non-Sterile 6-ply 4.5x4 (yd/yd) 3 x Per Week/30 Days Discharge Instructions: Apply Kerlix as directed WOUND #3: - Foot Wound Laterality: Plantar, Right Cleanser: Byram Ancillary Kit - 15 Day Supply (Generic) 3 x Per Week/30 Days Discharge Instructions: Use supplies as instructed; Kit contains: (15) Saline Bullets; (15) 3x3 Gauze; 15 pr Gloves Cleanser: Wound Cleanser 3 x Per Week/30 Days Discharge Instructions: Wash your hands with soap and water. Remove old dressing, discard into plastic bag and place into trash. Cleanse the wound with Wound Cleanser prior to applying a clean dressing using gauze sponges, not tissues or cotton balls. Do not scrub or use excessive  force. Pat dry using gauze sponges, not tissue or cotton balls. Prim Dressing: IODOFLEX 0.9% Cadexomer Iodine Pad (Dispense As Written) 3 x Per Week/30 Days ary Discharge Instructions: Apply Iodoflex to wound bed only as directed. Secondary Dressing: ABD Pad 5x9 (in/in) 3 x Per Week/30 Days Discharge Instructions: Cover with ABD pad Secured With: Kerlix Roll Sterile or Non-Sterile 6-ply 4.5x4 (yd/yd) 3 x Per Week/30 Days Discharge Instructions: Apply Kerlix as directed 1. I did send sampling of the necrotic bone for testing and again would not be evaluating for signs of osteomyelitis. He also has an MRI disc scheduled for December  7 which also is going to be beneficial for [place] to see where things stand there. 2. I am also can recommend we continue with Iodoflex as well as ABD pad to cover although his weight is as well as I want him to actually switch to changing instead of every other day to try to keep a little bit more dry. This daily We will see patient back for reevaluation in 1 week here in the clinic. If anything worsens or changes patient will contact our office for additional recommendations. Electronic Signature(s) Signed: 11/06/2022 4:56:22 PM By: Worthy Keeler PA-C Entered By: Worthy Keeler on 11/06/2022 16:56:22 [name] ([id number]) [id number] Page 9 of 9 -------------------------------------------------------------------------------- SuperBill Details Patient Name: Date of Service: SALAM, MICUCCI 11/06/2022 Medical Record Number: [id number] Patient Account Number: [id number] Date of Birth/Sex: Treating RN: 11/06/1966 (56 y.o. [name]) [name] Primary Care Provider: Steele Dillon Other Clinician: Massie Dillon Referring Provider: Treating Provider/Extender: [name] Weeks in Treatment: 1 Diagnosis Coding ICD-10 Codes Code Description [phone] Type 2 diabetes mellitus with foot ulcer L97.512 Non-pressure chronic  ulcer of other part of right foot with fat layer exposed I10 Essential (primary) hypertension Facility Procedures : CPT4 Code: [id number] 1 Description: 7681 - DEB SUBQ TISSUE 20 SQ CM/< ICD-10 Diagnosis Description L97.512 Non-pressure chronic ulcer of other part of right foot with fat layer exposed Modifier: Quantity: 1 : CPT4 Code: 15726203 1 Description: 5597 - DEB BONE 20 SQ CM/< ICD-10 Diagnosis Description L97.512 Non-pressure chronic ulcer of other part of right foot with fat layer exposed Modifier: Quantity: 1 Physician Procedures : CPT4: Description Modifier Code [id number] 36468 - WC PHYS SUBQ TISS 20 SQ CM ICD-10 Diagnosis Description L97.512 Non-pressure chronic ulcer of other part of right foot with fat layer exposed Quantity: 1 : CPT4: 0321224 Debridement; bone (includes epidermis, dermis, subQ tissue, muscle and/or fascia, if performed) 1st 20 sqcm or less ICD-10 Diagnosis Description L97.512 Non-pressure chronic ulcer of other part of right foot with fat layer exposed Quantity: 1 Electronic Signature(s) Signed: 11/06/2022 4:56:56 PM By: Worthy Keeler PA-C Entered By: Worthy Keeler on 11/06/2022 16:56:56

## 2022-11-06 NOTE — Progress Notes (Addendum)
Zachary, Dillon ([id number]) [id number] Page 1 of 8 Visit Report for 11/06/2022 Arrival Information Details Patient Name: Date of Service: [name], [name] 11/06/2022 3:45 PM Medical Record Number: [id number] Patient Account Number: [id number] Date of Birth/Sex: Treating RN: [birth year] (56 y.o. Jerilynn Mages) Carlene Coria Primary Care [name]: [name] Other Clinician: Massie Kluver Referring [name]: Treating [name]/Extender: [name]: 1 Visit Information History Since Last Visit All ordered tests and consults were completed: No Patient Arrived: Ambulatory Added or deleted any medications: No Arrival Time: 15:53 Any new allergies or adverse reactions: No Transfer Assistance: None Had a fall or experienced change in No Patient Identification Verified: Yes activities of daily living that may affect Secondary Verification Process Completed: Yes risk of falls: Patient Requires Transmission-Based Precautions: No Signs or symptoms of abuse/neglect since last visito No Patient Has Alerts: Yes Hospitalized since last visit: No Patient Alerts: DMII Implantable device outside of the clinic excluding No cellular tissue based products placed in the center since last visit: Has Dressing in Place as Prescribed: Yes Has Footwear/Offloading in Place as Prescribed: Yes Left: Other:defender boot Pain Present Now: No Electronic Signature(s) Signed: 11/06/2022 5:11:01 PM By: Massie Kluver Entered By: Massie Kluver on 11/06/2022 15:57:16 -------------------------------------------------------------------------------- Clinic Level of Care Assessment Details Patient Name: Date of Service: [name], [name] 11/06/2022 3:45 PM Medical Record Number: [id number] Patient Account Number: [id number] Date of Birth/Sex: Treating RN: 24-Oct-1966 (56 y.o. Oval Linsey Primary Care [name]: [name] Other Clinician: Massie Kluver Referring [name]: Treating [name]/Extender: [name]: 1 Clinic Level of Care Assessment Items TOOL 1 Quantity Score _0  - 0 Use when EandM and Procedure is performed on INITIAL visit ASSESSMENTS - Nursing Assessment / Reassessment _1  - 0 General Physical Exam (combine w/ comprehensive assessment (listed just below) when performed on new pt. evalsCHIGOZIE, [name] ([id number]) [id number] Page 2 of 8 _2  - 0 Comprehensive Assessment (HX, ROS, Risk Assessments, Wounds Hx, etc.) ASSESSMENTS - Wound and Skin Assessment / Reassessment _3  - 0 Dermatologic / Skin Assessment (not related to wound area) ASSESSMENTS - Ostomy and/or Continence Assessment and Care _4  - 0 Incontinence Assessment and Management _5  - 0 Ostomy Care Assessment and Management (repouching, etc.) PROCESS - Coordination of Care _6  - 0 Simple Patient / Family Education for ongoing care _7  - 0 Complex (extensive) Patient / Family Education for ongoing care _8  - 0 Staff obtains Programmer, systems, Records, T Results / Process Orders est _9  - 0 Staff telephones HHA, Nursing Homes / Clarify orders / etc _10  - 0 Routine Transfer to another Facility (non-emergent condition) _11  - 0 Routine Hospital Admission (non-emergent condition) _12  - 0 New Admissions / Biomedical engineer / Ordering NPWT Apligraf, etc. , _13  - 0 Emergency Hospital Admission (emergent condition) PROCESS - Special Needs _14  - 0 Pediatric / Minor Patient Management _15  - 0 Isolation Patient Management _16  - 0 Hearing / Language / Visual special needs _17  - 0 Assessment of Community assistance (transportation, D/C planning, etc.) _18  - 0 Additional assistance / Altered mentation _19  - 0 Support Surface(s) Assessment (bed, cushion, seat, etc.) INTERVENTIONS - Miscellaneous _20  - 0 External ear exam _21  - 0 Patient Transfer (multiple staff / Civil Service fast streamer / Similar devices) _22  -  0 Simple Staple / Suture removal (25 or less) _23  - 0 Complex Staple / Suture removal (26 or more) _24  - 0 Hypo/Hyperglycemic Management (do not check if billed separately) _25  - 0 Ankle / Brachial  Index (ABI) - do not check if billed separately Has the patient been seen at the hospital within the last three years: Yes Total Score: 0 Level Of Care: ____ Electronic Signature(s) Signed: 11/06/2022 5:11:01 PM By: Massie Kluver Entered By: Massie Kluver on 11/06/2022 16:31:47 -------------------------------------------------------------------------------- Encounter Discharge Information Details Patient Name: Date of Service: Zachary Cabot. 11/06/2022 3:45 PM Medical Record Number: [id number] Patient Account Number: [id number] Date of Birth/Sex: Treating RN: 08/07/1966 ([address] y.o. Oval Linsey [place], [place] L ([id number]) [id number] Page 3 of 8 Primary Care Jartavious Mckimmy: [name] Other Clinician: Massie Kluver Referring Denorris Reust: Treating Lestine Rahe/Extender: [name]: 1 Encounter Discharge Information Items Post Procedure Vitals Discharge Condition: Stable Temperature (F): 98.6 Ambulatory Status: Ambulatory Pulse (bpm): 93 Discharge Destination: Home Respiratory Rate (breaths/min): 16 Transportation: Private Auto Blood Pressure (mmHg): 170/100 Accompanied By: wife Schedule Follow-up Appointment: Yes Clinical Summary of Care: Electronic Signature(s) Signed: 11/06/2022 5:11:01 PM By: Massie Kluver Entered By: Massie Kluver on 11/06/2022 16:58:55 -------------------------------------------------------------------------------- Lower Extremity Assessment Details Patient Name: Date of Service: [name], [name] 11/06/2022 3:45 PM Medical Record Number: [id number] Patient Account Number: [id number] Date of Birth/Sex: Treating RN: May 20, 1966 (56 y.o. Jerilynn Mages) Carlene Coria Primary Care Lorel Lembo: [name] Other  Clinician: Massie Kluver Referring Rosana Farnell: Treating Armenta Erskin/Extender: [name]: 1 Electronic Signature(s) Signed: 11/06/2022 5:11:01 PM By: Massie Kluver Signed: 11/07/2022 4:00:23 PM By: Carlene Coria RN Entered By: Massie Kluver on 11/06/2022 16:13:39 -------------------------------------------------------------------------------- Multi Wound Chart Details Patient Name: Date of Service: Zachary Cabot. 11/06/2022 3:45 PM Medical Record Number: [id number] Patient Account Number: [id number] Date of Birth/Sex: Treating RN: 03-20-1966 (56 y.o. Oval Linsey Primary Care Patti Shorb: [name] Other Clinician: Massie Kluver Referring Dreden Rivere: Treating Juliauna Stueve/Extender: [name]: 1 Vital Signs Height(in): 72 Pulse(bpm): 93 Weight(lbs): 190 Blood Pressure(mmHg): 172/100 Body Mass Index(BMI): 25.8 Temperature(F): 98.6 Respiratory Rate(breaths/min): [place], [place] ([id number]) [id number] Page 4 of 8 [2:Photos:] [N/A:N/A] Right, Lateral Foot Right, Plantar Foot N/A Wound Location: Gradually Appeared Gradually Appeared N/A Wounding Event: Diabetic Wound/Ulcer of the Lower Diabetic Wound/Ulcer of the Lower N/A Primary Etiology: Extremity Extremity Hypertension, Type II Diabetes Hypertension, Type II Diabetes N/A Comorbid History: 08/13/2022 08/21/2022 N/A Date Acquired: 1 1 N/A Weeks of Dillon: Open Open N/A Wound Status: No No N/A Wound Recurrence: 1.9x2x0.4 0.9x0.7x0.2 N/A Measurements L x W x D (cm) 2.985 0.495 N/A A (cm) : rea 1.194 0.099 N/A Volume (cm) : -68.90% -80.00% N/A % Reduction in A rea: -68.90% -80.00% N/A % Reduction in Volume: Grade 2 Grade 1 N/A Classification: Medium Medium N/A Exudate A mount: Serous Serosanguineous N/A Exudate Type: amber red, brown N/A Exudate Color: Yes No N/A Foul Odor A Cleansing: fter No N/A  N/A Odor A nticipated Due to Product Use: Small (1-33%) Small (1-33%) N/A Granulation A mount: Red, Pink Red, Pink N/A Granulation Quality: Small (1-33%) Small (1-33%) N/A Necrotic A mount: Fat Layer (Subcutaneous Tissue): Yes Fat Layer (Subcutaneous Tissue): Yes N/A Exposed Structures: Muscle: Yes Fascia: No Bone: Yes Tendon: No Fascia: No Muscle: No Tendon: No Joint: No Joint: No Bone: No Medium (34-66%) Small (1-33%) N/A Epithelialization: Dillon Notes Electronic Signature(s) Signed: 11/06/2022 5:11:01 PM By: Massie Kluver Entered By: Massie Kluver on 11/06/2022 16:13:50 -------------------------------------------------------------------------------- Multi-Disciplinary Care Plan Details Patient Name: Date of Service: [name], [name] 11/06/2022 3:45 PM Medical Record Number: [id number] Patient Account Number: [id number] Date of Birth/Sex: Treating RN: February 23, 1966 (56 y.o. M) Epps,  [facility] Primary Care Dimitrious Micciche: [name] Other Clinician: Massie Kluver Referring Valoria Tamburri: Treating Malakye Nolden/Extender: [name]: 1 Active Inactive [place], [place] ([id number]) [id number] Page 5 of 8 Electronic Signature(s) Signed: 12/15/2022 4:56:43 PM By: Gretta Cool, BSN, RN, CWS, Kim RN, BSN Signed: 01/04/2023 4:03:50 PM By: Carlene Coria RN Previous Signature: 12/15/2022 4:56:00 PM Version By: Gretta Cool, BSN, RN, CWS, Kim RN, BSN Previous Signature: 11/06/2022 5:11:01 PM Version By: Massie Kluver Previous Signature: 11/07/2022 4:00:23 PM Version By: Carlene Coria RN Entered By: Gretta Cool, BSN, RN, CWS, Kim on 12/15/2022 16:56:43 -------------------------------------------------------------------------------- Pain Assessment Details Patient Name: Date of Service: [name], [name] 11/06/2022 3:45 PM Medical Record Number: [id number] Patient Account Number: [id number] Date of Birth/Sex: Treating RN: 06/15/1966 (56 y.o. Oval Linsey Primary Care Kedar Sedano: [name] Other Clinician: Massie Kluver Referring Venita Seng: Treating Jaydyn Bozzo/Extender: [name]: 1 Active Problems Location of Pain Severity and Description of Pain Patient Has Paino No Site Locations Pain Management and Medication Current Pain Management: Electronic Signature(s) Signed: 11/06/2022 5:11:01 PM By: Massie Kluver Signed: 11/07/2022 4:00:23 PM By: Carlene Coria RN Entered By: Massie Kluver on 11/06/2022 16:12:35 Boisselle, Barbara Cower ([id number]) [id number] Page 6 of 8 -------------------------------------------------------------------------------- Patient/Caregiver Education Details Patient Name: Date of Service: [name], [name] 11/27/2023andnbsp3:45 PM Medical Record Number: [id number] Patient Account Number: [id number] Date of Birth/Gender: Treating RN: [birth year] (56 y.o. Jerilynn Mages) Carlene Coria Primary Care Physician: [name] Other Clinician: Massie Kluver Referring Physician: Treating Physician/Extender: Rogelio Seen in Dillon: 1 Education Assessment Education Provided To: Patient and Caregiver Education Topics Provided Hyperbaric Oxygenation: Handouts: Hyperbaric Oxygen Methods: Explain/Verbal Responses: State content correctly Electronic Signature(s) Signed: 11/06/2022 5:11:01 PM By: Massie Kluver Entered By: Massie Kluver on 11/06/2022 16:32:12 -------------------------------------------------------------------------------- Wound Assessment Details Patient Name: Date of Service: [name], [name] 11/06/2022 3:45 PM Medical Record Number: [id number] Patient Account Number: [id number] Date of Birth/Sex: Treating RN: 1966/07/21 (56 y.o. Jerilynn Mages) Carlene Coria Primary Care Tinna Kolker: [name] Other Clinician: Massie Kluver Referring Avelyn Touch: Treating Shanieka Blea/Extender: [name]: 1 Wound  Status Wound Number: 2 Primary Etiology: Diabetic Wound/Ulcer of the Lower Extremity Wound Location: Right, Lateral Foot Wound Status: Open Wounding Event: Gradually Appeared Comorbid History: Hypertension, Type II Diabetes Date Acquired: 08/13/2022 Weeks Of Dillon: 1 Clustered Wound: No Photos Wound Measurements Length: (cm) 1.9 Mcisaac, Smokey L ([id number]) Width: (cm) 2 Depth: (cm) 0.4 Area: (cm) 2.985 Volume: (cm) 1.194 % Reduction in Area: -68.9% [id number] Page 7 of 8 % Reduction in Volume: -68.9% Epithelialization: Medium (34-66%) Tunneling: No Undermining: No Wound Description Classification: Grade 2 Exudate Amount: Medium Exudate Type: Serous Exudate Color: amber Foul Odor After Cleansing: Yes Due to Product Use: No Slough/Fibrino Yes Wound Bed Granulation Amount: Small (1-33%) Exposed Structure Granulation Quality: Red, Pink Fascia Exposed: No Necrotic Amount: Small (1-33%) Fat Layer (Subcutaneous Tissue) Exposed: Yes Necrotic Quality: Adherent Slough Tendon Exposed: No Muscle Exposed: Yes Necrosis of Muscle: Yes Joint Exposed: No Bone Exposed: Yes Electronic Signature(s) Signed: 11/06/2022 5:11:01 PM By: Massie Kluver Signed: 11/07/2022 4:00:23 PM By: Carlene Coria RN Entered By: Massie Kluver on 11/06/2022 16:13:04 -------------------------------------------------------------------------------- Wound Assessment Details Patient Name: Date of Service: [name], [name] 11/06/2022 3:45 PM Medical Record Number: [id number] Patient Account Number: [id number] Date of Birth/Sex: Treating RN: 10/21/1966 (56 y.o. Oval Linsey Primary Care Omran Keelin: [name] Other Clinician: Massie Kluver Referring Kahlan Engebretson: Treating Keysha Damewood/Extender: [name]: 1 Wound Status Wound  Number: 3 Primary Etiology: Diabetic Wound/Ulcer of the Lower Extremity Wound Location: Right, Plantar Foot Wound  Status: Open Wounding Event: Gradually Appeared Comorbid History: Hypertension, Type II Diabetes Date Acquired: 08/21/2022 Weeks Of Dillon: 1 Clustered Wound: No Photos Wound Measurements Length: (cm) 0.9 Width: (cm) 0.7 Pablo, Dennison L ([id number]) Depth: (cm) 0.2 Area: (cm) 0. Volume: (cm) 0. % Reduction in Area: -80% % Reduction in Volume: -80% [id number] Page 8 of 8 Epithelialization: Small (1-33%) 495 Tunneling: No 099 Undermining: No Wound Description Classification: Grade 1 Exudate Amount: Medium Exudate Type: Serosanguineous Exudate Color: red, brown Foul Odor After Cleansing: No Slough/Fibrino Yes Wound Bed Granulation Amount: Small (1-33%) Exposed Structure Granulation Quality: Red, Pink Fascia Exposed: No Necrotic Amount: Small (1-33%) Fat Layer (Subcutaneous Tissue) Exposed: Yes Necrotic Quality: Adherent Slough Tendon Exposed: No Muscle Exposed: No Joint Exposed: No Bone Exposed: No Electronic Signature(s) Signed: 11/06/2022 5:11:01 PM By: Massie Kluver Signed: 11/07/2022 4:00:23 PM By: Carlene Coria RN Entered By: Massie Kluver on 11/06/2022 16:13:30 -------------------------------------------------------------------------------- Vitals Details Patient Name: Date of Service: Zachary Cabot. 11/06/2022 3:45 PM Medical Record Number: [id number] Patient Account Number: [id number] Date of Birth/Sex: Treating RN: 09/21/1966 (56 y.o. Oval Linsey Primary Care Zerick Prevette: [name] Other Clinician: Massie Kluver Referring Angeliah Wisdom: Treating Reginald Weida/Extender: [name]: 1 Vital Signs Time Taken: 15:37 Temperature (F): 98.6 Height (in): 72 Pulse (bpm): 93 Weight (lbs): 190 Respiratory Rate (breaths/min): 16 Body Mass Index (BMI): 25.8 Blood Pressure (mmHg): 172/100 Reference Range: 80 - 120 mg / dl Electronic Signature(s) Signed: 11/06/2022 5:11:01 PM By: Massie Kluver Entered By: Massie Kluver on 11/06/2022 16:12:25

## 2022-11-08 LAB — SURGICAL PATHOLOGY

## 2022-11-11 ENCOUNTER — Other Ambulatory Visit (HOSPITAL_COMMUNITY): Payer: Managed Care, Other (non HMO)

## 2022-11-13 ENCOUNTER — Ambulatory Visit: Payer: Managed Care, Other (non HMO) | Admitting: Physician Assistant

## 2022-11-16 ENCOUNTER — Ambulatory Visit (HOSPITAL_COMMUNITY): Payer: Managed Care, Other (non HMO)

## 2022-11-16 ENCOUNTER — Telehealth: Payer: Self-pay | Admitting: Podiatry

## 2022-11-16 ENCOUNTER — Telehealth: Payer: Self-pay | Admitting: *Deleted

## 2022-11-16 NOTE — Telephone Encounter (Signed)
Pt returning your call about the mri being canceled> Please call him back as soon as you can.

## 2022-11-17 ENCOUNTER — Other Ambulatory Visit: Payer: Self-pay | Admitting: Family Medicine

## 2022-11-17 DIAGNOSIS — I10 Essential (primary) hypertension: Secondary | ICD-10-CM

## 2022-11-17 NOTE — Telephone Encounter (Signed)
error 

## 2022-11-21 ENCOUNTER — Emergency Department: Payer: Managed Care, Other (non HMO)

## 2022-11-21 ENCOUNTER — Other Ambulatory Visit: Payer: Self-pay

## 2022-11-21 ENCOUNTER — Inpatient Hospital Stay
Admission: EM | Admit: 2022-11-21 | Discharge: 2022-11-30 | DRG: 239 | Disposition: A | Discharge status: Home Health | Payer: Managed Care, Other (non HMO) | Attending: Internal Medicine | Admitting: Internal Medicine

## 2022-11-21 ENCOUNTER — Inpatient Hospital Stay: Payer: Managed Care, Other (non HMO)

## 2022-11-21 ENCOUNTER — Ambulatory Visit: Payer: Managed Care, Other (non HMO) | Admitting: Physician Assistant

## 2022-11-21 DIAGNOSIS — A480 Gas gangrene: Secondary | ICD-10-CM | POA: Insufficient documentation

## 2022-11-21 DIAGNOSIS — D6859 Other primary thrombophilia: Secondary | ICD-10-CM | POA: Diagnosis present

## 2022-11-21 DIAGNOSIS — M726 Necrotizing fasciitis: Secondary | ICD-10-CM | POA: Diagnosis present

## 2022-11-21 DIAGNOSIS — L089 Local infection of the skin and subcutaneous tissue, unspecified: Secondary | ICD-10-CM | POA: Diagnosis present

## 2022-11-21 DIAGNOSIS — I959 Hypotension, unspecified: Secondary | ICD-10-CM | POA: Diagnosis not present

## 2022-11-21 DIAGNOSIS — M00871 Arthritis due to other bacteria, right ankle and foot: Secondary | ICD-10-CM | POA: Diagnosis present

## 2022-11-21 DIAGNOSIS — E11621 Type 2 diabetes mellitus with foot ulcer: Secondary | ICD-10-CM | POA: Diagnosis present

## 2022-11-21 DIAGNOSIS — E1169 Type 2 diabetes mellitus with other specified complication: Secondary | ICD-10-CM | POA: Diagnosis present

## 2022-11-21 DIAGNOSIS — I739 Peripheral vascular disease, unspecified: Secondary | ICD-10-CM | POA: Diagnosis present

## 2022-11-21 DIAGNOSIS — I10 Essential (primary) hypertension: Secondary | ICD-10-CM | POA: Diagnosis present

## 2022-11-21 DIAGNOSIS — Z89511 Acquired absence of right leg below knee: Secondary | ICD-10-CM | POA: Diagnosis not present

## 2022-11-21 DIAGNOSIS — Z1611 Resistance to penicillins: Secondary | ICD-10-CM | POA: Diagnosis present

## 2022-11-21 DIAGNOSIS — M86671 Other chronic osteomyelitis, right ankle and foot: Secondary | ICD-10-CM | POA: Diagnosis present

## 2022-11-21 DIAGNOSIS — E11628 Type 2 diabetes mellitus with other skin complications: Secondary | ICD-10-CM | POA: Diagnosis present

## 2022-11-21 DIAGNOSIS — Z8249 Family history of ischemic heart disease and other diseases of the circulatory system: Secondary | ICD-10-CM

## 2022-11-21 DIAGNOSIS — E876 Hypokalemia: Secondary | ICD-10-CM | POA: Diagnosis present

## 2022-11-21 DIAGNOSIS — E1152 Type 2 diabetes mellitus with diabetic peripheral angiopathy with gangrene: Secondary | ICD-10-CM | POA: Diagnosis not present

## 2022-11-21 DIAGNOSIS — E785 Hyperlipidemia, unspecified: Secondary | ICD-10-CM | POA: Diagnosis present

## 2022-11-21 DIAGNOSIS — N179 Acute kidney failure, unspecified: Secondary | ICD-10-CM | POA: Diagnosis present

## 2022-11-21 DIAGNOSIS — L97512 Non-pressure chronic ulcer of other part of right foot with fat layer exposed: Secondary | ICD-10-CM | POA: Diagnosis present

## 2022-11-21 DIAGNOSIS — R630 Anorexia: Secondary | ICD-10-CM | POA: Diagnosis present

## 2022-11-21 DIAGNOSIS — L89312 Pressure ulcer of right buttock, stage 2: Secondary | ICD-10-CM | POA: Diagnosis present

## 2022-11-21 DIAGNOSIS — M8610 Other acute osteomyelitis, unspecified site: Secondary | ICD-10-CM | POA: Diagnosis not present

## 2022-11-21 DIAGNOSIS — Z79899 Other long term (current) drug therapy: Secondary | ICD-10-CM

## 2022-11-21 DIAGNOSIS — E1165 Type 2 diabetes mellitus with hyperglycemia: Secondary | ICD-10-CM | POA: Diagnosis present

## 2022-11-21 DIAGNOSIS — B9620 Unspecified Escherichia coli [E. coli] as the cause of diseases classified elsewhere: Secondary | ICD-10-CM | POA: Diagnosis present

## 2022-11-21 DIAGNOSIS — R627 Adult failure to thrive: Secondary | ICD-10-CM | POA: Diagnosis present

## 2022-11-21 DIAGNOSIS — E872 Acidosis, unspecified: Secondary | ICD-10-CM | POA: Diagnosis present

## 2022-11-21 DIAGNOSIS — I441 Atrioventricular block, second degree: Secondary | ICD-10-CM | POA: Diagnosis present

## 2022-11-21 DIAGNOSIS — Z6823 Body mass index (BMI) 23.0-23.9, adult: Secondary | ICD-10-CM

## 2022-11-21 DIAGNOSIS — Z7984 Long term (current) use of oral hypoglycemic drugs: Secondary | ICD-10-CM

## 2022-11-21 DIAGNOSIS — I2489 Other forms of acute ischemic heart disease: Secondary | ICD-10-CM | POA: Diagnosis present

## 2022-11-21 DIAGNOSIS — D6489 Other specified anemias: Secondary | ICD-10-CM | POA: Diagnosis present

## 2022-11-21 DIAGNOSIS — Z821 Family history of blindness and visual loss: Secondary | ICD-10-CM

## 2022-11-21 DIAGNOSIS — Z818 Family history of other mental and behavioral disorders: Secondary | ICD-10-CM

## 2022-11-21 DIAGNOSIS — D62 Acute posthemorrhagic anemia: Secondary | ICD-10-CM | POA: Diagnosis not present

## 2022-11-21 DIAGNOSIS — Z794 Long term (current) use of insulin: Secondary | ICD-10-CM | POA: Diagnosis not present

## 2022-11-21 DIAGNOSIS — Z9889 Other specified postprocedural states: Secondary | ICD-10-CM | POA: Diagnosis not present

## 2022-11-21 DIAGNOSIS — Z89421 Acquired absence of other right toe(s): Secondary | ICD-10-CM

## 2022-11-21 DIAGNOSIS — R7989 Other specified abnormal findings of blood chemistry: Secondary | ICD-10-CM | POA: Insufficient documentation

## 2022-11-21 DIAGNOSIS — I96 Gangrene, not elsewhere classified: Secondary | ICD-10-CM | POA: Diagnosis not present

## 2022-11-21 DIAGNOSIS — E538 Deficiency of other specified B group vitamins: Secondary | ICD-10-CM | POA: Diagnosis present

## 2022-11-21 DIAGNOSIS — K5900 Constipation, unspecified: Secondary | ICD-10-CM | POA: Diagnosis present

## 2022-11-21 LAB — SEDIMENTATION RATE: Sed Rate: 127 mm/hr — ABNORMAL HIGH (ref 0–20)

## 2022-11-21 LAB — BLOOD GAS, VENOUS
Acid-base deficit: 9.8 mmol/L — ABNORMAL HIGH (ref 0.0–2.0)
Bicarbonate: 15.1 mmol/L — ABNORMAL LOW (ref 20.0–28.0)
O2 Saturation: 47 %
Patient temperature: 37
pCO2, Ven: 30 mmHg — ABNORMAL LOW (ref 44–60)
pH, Ven: 7.31 (ref 7.25–7.43)
pO2, Ven: 35 mmHg (ref 32–45)

## 2022-11-21 LAB — CBC
HCT: 33.2 % — ABNORMAL LOW (ref 39.0–52.0)
Hemoglobin: 10.4 g/dL — ABNORMAL LOW (ref 13.0–17.0)
MCH: 28 pg (ref 26.0–34.0)
MCHC: 31.3 g/dL (ref 30.0–36.0)
MCV: 89.2 fL (ref 80.0–100.0)
Platelets: 416 10*3/uL — ABNORMAL HIGH (ref 150–400)
RBC: 3.72 MIL/uL — ABNORMAL LOW (ref 4.22–5.81)
RDW: 13.7 % (ref 11.5–15.5)
WBC: 16.9 10*3/uL — ABNORMAL HIGH (ref 4.0–10.5)
nRBC: 0 % (ref 0.0–0.2)

## 2022-11-21 LAB — BASIC METABOLIC PANEL
Anion gap: 27 — ABNORMAL HIGH (ref 5–15)
BUN: 52 mg/dL — ABNORMAL HIGH (ref 6–20)
CO2: 15 mmol/L — ABNORMAL LOW (ref 22–32)
Calcium: 10.2 mg/dL (ref 8.9–10.3)
Chloride: 95 mmol/L — ABNORMAL LOW (ref 98–111)
Creatinine, Ser: 1.69 mg/dL — ABNORMAL HIGH (ref 0.61–1.24)
GFR, Estimated: 47 mL/min — ABNORMAL LOW (ref >60)
Glucose, Bld: 215 mg/dL — ABNORMAL HIGH (ref 70–99)
Potassium: 2.6 mmol/L — CL (ref 3.5–5.1)
Sodium: 137 mmol/L (ref 135–145)

## 2022-11-21 LAB — TROPONIN I (HIGH SENSITIVITY)
Troponin I (High Sensitivity): 26 ng/L — ABNORMAL HIGH (ref <18)
Troponin I (High Sensitivity): 29 ng/L — ABNORMAL HIGH (ref <18)

## 2022-11-21 LAB — LACTIC ACID, PLASMA: Lactic Acid, Venous: 1.9 mmol/L (ref 0.5–1.9)

## 2022-11-21 LAB — CBG MONITORING, ED: Glucose-Capillary: 212 mg/dL — ABNORMAL HIGH (ref 70–99)

## 2022-11-21 MED ORDER — INSULIN ASPART 100 UNIT/ML IJ SOLN
0.0000 [IU] | Freq: Three times a day (TID) | INTRAMUSCULAR | Status: DC
  Administered 2022-11-22: 3 [IU] via SUBCUTANEOUS
  Administered 2022-11-22: 7 [IU] via SUBCUTANEOUS
  Administered 2022-11-22 – 2022-11-23 (×2): 2 [IU] via SUBCUTANEOUS
  Administered 2022-11-23 – 2022-11-25 (×3): 3 [IU] via SUBCUTANEOUS
  Administered 2022-11-25: 5 [IU] via SUBCUTANEOUS
  Administered 2022-11-25 – 2022-11-26 (×3): 2 [IU] via SUBCUTANEOUS
  Administered 2022-11-27: 3 [IU] via SUBCUTANEOUS
  Administered 2022-11-27: 2 [IU] via SUBCUTANEOUS
  Administered 2022-11-27: 7 [IU] via SUBCUTANEOUS
  Administered 2022-11-28: 4 [IU] via SUBCUTANEOUS
  Administered 2022-11-28: 1 [IU] via SUBCUTANEOUS
  Administered 2022-11-29: 3 [IU] via SUBCUTANEOUS
  Administered 2022-11-29: 5 [IU] via SUBCUTANEOUS
  Filled 2022-11-21 (×19): qty 1

## 2022-11-21 MED ORDER — VANCOMYCIN HCL 1500 MG/300ML IV SOLN
1500.0000 mg | Freq: Once | INTRAVENOUS | Status: AC
  Administered 2022-11-21: 1500 mg via INTRAVENOUS
  Filled 2022-11-21: qty 300

## 2022-11-21 MED ORDER — POTASSIUM CHLORIDE 10 MEQ/100ML IV SOLN
10.0000 meq | Freq: Once | INTRAVENOUS | Status: AC
  Administered 2022-11-21: 10 meq via INTRAVENOUS
  Filled 2022-11-21: qty 100

## 2022-11-21 MED ORDER — SODIUM CHLORIDE 0.9 % IV BOLUS: 1000.0000 mL | Freq: Once | INTRAVENOUS | Status: DC

## 2022-11-21 MED ORDER — PIPERACILLIN-TAZOBACTAM 3.375 G IVPB 30 MIN
3.3750 g | Freq: Once | INTRAVENOUS | Status: DC
  Filled 2022-11-21: qty 50

## 2022-11-21 MED ORDER — LACTATED RINGERS IV BOLUS
1000.0000 mL | Freq: Once | INTRAVENOUS | Status: AC
  Administered 2022-11-21: 1000 mL via INTRAVENOUS

## 2022-11-21 MED ORDER — ONDANSETRON HCL 4 MG PO TABS: 4.0000 mg | ORAL_TABLET | Freq: Four times a day (QID) | ORAL | Status: AC | PRN

## 2022-11-21 MED ORDER — INSULIN ASPART 100 UNIT/ML IJ SOLN
0.0000 [IU] | Freq: Every day | INTRAMUSCULAR | Status: DC
  Administered 2022-11-21 – 2022-11-27 (×6): 2 [IU] via SUBCUTANEOUS
  Administered 2022-11-28: 4 [IU] via SUBCUTANEOUS
  Filled 2022-11-21 (×7): qty 1

## 2022-11-21 MED ORDER — POTASSIUM CHLORIDE 10 MEQ/100ML IV SOLN
10.0000 meq | Freq: Once | INTRAVENOUS | Status: DC
  Filled 2022-11-21: qty 100

## 2022-11-21 MED ORDER — HYDRALAZINE HCL 20 MG/ML IJ SOLN: 5.0000 mg | Freq: Four times a day (QID) | INTRAMUSCULAR | Status: DC | PRN

## 2022-11-21 MED ORDER — HEPARIN SODIUM (PORCINE) 5000 UNIT/ML IJ SOLN: 5000.0000 [IU] | Freq: Three times a day (TID) | INTRAMUSCULAR | Status: DC

## 2022-11-21 MED ORDER — LACTATED RINGERS IV SOLN: INTRAVENOUS | Status: AC

## 2022-11-21 MED ORDER — ACETAMINOPHEN 325 MG RE SUPP: 650.0000 mg | Freq: Four times a day (QID) | RECTAL | Status: DC | PRN

## 2022-11-21 MED ORDER — ACETAMINOPHEN 325 MG PO TABS: 650.0000 mg | ORAL_TABLET | Freq: Four times a day (QID) | ORAL | Status: DC | PRN

## 2022-11-21 MED ORDER — ONDANSETRON HCL 4 MG/2ML IJ SOLN: 4.0000 mg | Freq: Four times a day (QID) | INTRAMUSCULAR | Status: AC | PRN

## 2022-11-21 MED ORDER — POTASSIUM CHLORIDE CRYS ER 20 MEQ PO TBCR
40.0000 meq | EXTENDED_RELEASE_TABLET | Freq: Once | ORAL | Status: AC
  Administered 2022-11-22: 20 meq via ORAL
  Filled 2022-11-21: qty 2

## 2022-11-21 MED ORDER — METRONIDAZOLE 500 MG/100ML IV SOLN
500.0000 mg | Freq: Once | INTRAVENOUS | Status: AC
  Administered 2022-11-21: 500 mg via INTRAVENOUS
  Filled 2022-11-21: qty 100

## 2022-11-21 MED ORDER — SODIUM CHLORIDE 0.9 % IV SOLN
2.0000 g | Freq: Two times a day (BID) | INTRAVENOUS | Status: DC
  Administered 2022-11-21 – 2022-11-22 (×2): 2 g via INTRAVENOUS
  Filled 2022-11-21 (×2): qty 12.5

## 2022-11-21 MED ORDER — AMLODIPINE BESYLATE 5 MG PO TABS: 5.0000 mg | ORAL_TABLET | Freq: Every day | ORAL | Status: DC

## 2022-11-21 MED ORDER — IOHEXOL 300 MG/ML  SOLN
100.0000 mL | Freq: Once | INTRAMUSCULAR | Status: AC | PRN
  Administered 2022-11-21: 100 mL via INTRAVENOUS

## 2022-11-21 MED ORDER — ROSUVASTATIN CALCIUM 10 MG PO TABS
40.0000 mg | ORAL_TABLET | Freq: Every day | ORAL | Status: DC
  Administered 2022-11-22 – 2022-11-29 (×8): 40 mg via ORAL
  Filled 2022-11-21 (×7): qty 4

## 2022-11-21 MED ORDER — SENNOSIDES-DOCUSATE SODIUM 8.6-50 MG PO TABS: 1.0000 | ORAL_TABLET | Freq: Every evening | ORAL | Status: DC | PRN

## 2022-11-21 MED ORDER — POTASSIUM CHLORIDE 20 MEQ PO PACK: 40.0000 meq | PACK | Freq: Every day | ORAL | Status: DC

## 2022-11-21 NOTE — ED Notes (Signed)
Patient refusing COVID swab. States he does not trust the swabs and will not allow swab to be performed.

## 2022-11-21 NOTE — Consult Note (Addendum)
PODIATRY CONSULTATION  NAME [name] MRN [id number] DOB September 24, 1966 DOA 11/21/2022   Reason for consult: Gas Gangrene RLE Chief Complaint  Patient presents with   Weakness    Consulting physician: Dr. Conni Slipper MD  History of present illness: 56 y.o. male PMHx uncontrolled diabetes mellitus presenting to the emergency department for worsening infection of the right lower extremity.  Patient has noticed a strong malodor.  History of chronic osteomyelitis currently being treated at the [facility] wound care center.  Initial x-rays of the foot demonstrate gas within the soft tissues.  Podiatry was consulted.  Past Medical History:  Diagnosis Date   Diabetes mellitus without complication ([place])    Hyperlipidemia    Hypertension        Latest Ref Rng & Units 11/21/2022    5:53 PM 09/06/2022    2:17 PM 11/09/2021    9:02 AM  CBC  WBC 4.0 - 10.5 K/uL 16.9  7.9  6.2   Hemoglobin 13.0 - 17.0 g/dL 10.4  14.2  14.1   Hematocrit 39.0 - 52.0 % 33.2  42.4  42.7   Platelets 150 - 400 K/uL 416  312  273        Latest Ref Rng & Units 11/21/2022    5:53 PM 09/06/2022    2:17 PM 02/15/2022    4:29 PM  BMP  Glucose 70 - 99 mg/dL 215  224  170   BUN 6 - 20 mg/dL 52  25  38   Creatinine 0.61 - 1.24 mg/dL 1.69  1.23  1.33   BUN/Creat Ratio 9 - '20  20  29   '$ Sodium 135 - 145 mmol/L 137  141  147   Potassium 3.5 - 5.1 mmol/L 2.6  4.4  4.4   Chloride 98 - 111 mmol/L 95  99  96   CO2 22 - 32 mmol/L '15  23  21   '$ Calcium 8.9 - 10.3 mg/dL 10.2  10.0  10.4       Physical Exam: General: The patient is alert and oriented x3 in no acute distress.   Dermatology: Chronic ulcer noted right plantar foot now presenting with strong malodor.   Vascular: Heavy edema noted right lower extremity  Neurological: Diminished via light touch  Musculoskeletal Exam: Prior history of partial amputation right foot   DG FOOT COMPLETE RIGHT IMPRESSION: 1. Extensive soft tissue swelling and subcutaneous gas  within the dorsum of the right forefoot and midfoot. Correlation for aggressive soft tissue infection is recommended. 2. Findings in keeping with osteomyelitis/septic arthritis involving the third metatarsophalangeal joint. 3. Suspected periarticular osteomyelitis involving the base of the fourth metatarsal and cuboid. 4. Right fifth ray amputation.  DG TIBIA/FIBULA RIGHT FINDINGS: There is extensive air identified in the subcutaneous tissues along the lateral aspect of the lower leg extending from the foot. These extend to the level of the knee joint. No bony erosive changes are identified. No definitive erosive changes are seen in the tibia and fibula. Stable changes in the foot are noted similar to that seen earlier on the same day.   IMPRESSION: Progression of subcutaneous air to the level of the knee joint. No abnormality involving the tibia and fibula is seen.  ASSESSMENT/PLAN OF CARE Chronic osteomyelitis right foot with acute onset of gas gangrene extending to the level of the knee  - Discussed with patient the emergent condition and aggressive nature of the infection he is currently dealing with. Explained that this is a life  threatening infection.  - Patient understands and amenable to any treatment protocol necessary including amputation - Discussed with ED Physicians Drs. Cox and [place]. Emergent consult placed to general surgery - Podiatry will sign off  Thank you for the consult.  Please contact me via secure chat with any questions or concerns.    Edrick Kins, DPM [facility]  Dr. Edrick Kins, DPM    [address], [place] [address]                Office [phone]  Fax [phone]

## 2022-11-21 NOTE — ED Notes (Signed)
MD [facility] informed of pt's K+ level

## 2022-11-21 NOTE — ED Triage Notes (Signed)
Pt comes via EMs from home with c/o weakness for 4 days. Pt has right pinkie toe amputated due to diabetes. There is odor to site. Pt doesn't have appetite.

## 2022-11-21 NOTE — Progress Notes (Signed)
Pharmacy Antibiotic Note  [name] is a 56 y.o. male admitted on 11/21/2022 with cellulitis.  Pharmacy has been consulted for cefepime dosing.  Plan: start cefepime 2 grams IV every 12 hours ---follow renal function for needed dose adjustments  Height: '5\' 11"'$  (180.3 cm) Weight: 85.7 kg (189 lb) IBW/kg (Calculated) : 75.3  Temp (24hrs), Avg:98.5 F (36.9 C), Min:98.5 F (36.9 C), Max:98.5 F (36.9 C)  Recent Labs  Lab 11/21/22 1753  WBC 16.9*  CREATININE 1.69*  LATICACIDVEN 1.9    Estimated Creatinine Clearance: 52.6 mL/min (A) (by C-G formula based on SCr of 1.69 mg/dL (H)).    No Known Allergies  Antimicrobials this admission: 12/12 cefepime >>   Thank you for allowing pharmacy to be a part of this patient's care.  Dallie Piles 11/21/2022 9:28 PM

## 2022-11-21 NOTE — Assessment & Plan Note (Addendum)
-   Prerenal etiology -Current BUN 32 with a creatinine of 1.06

## 2022-11-21 NOTE — Assessment & Plan Note (Addendum)
Right lower extremity gas gangrene - General surgery has been consulted, Dr. Dahlia Byes is aware - Status post right above-the-knee amputation -Patient is doing well clinically.  Per patient and wife they do not feel he would be able to participate in CIR level of rehabilitative therapies therefore plan is to discharge to SNF for additional rehab -No longer requiring antibiotics -Continue both oral and IV narcotics as well as nonnarcotic medications for pain

## 2022-11-21 NOTE — Assessment & Plan Note (Addendum)
-   Secondary to demand ischemia

## 2022-11-21 NOTE — Assessment & Plan Note (Addendum)
-   Hold home amlodipine as well as valsartan HCTZ especially in light of hypoperfusion and recent AKI - Hydralazine 5 mg IV every 6 hours as needed for hypertension

## 2022-11-21 NOTE — Assessment & Plan Note (Signed)
-   Resumed home rosuvastatin 40 mg nightly

## 2022-11-21 NOTE — Assessment & Plan Note (Addendum)
Resolved

## 2022-11-21 NOTE — Hospital Course (Addendum)
Mr. Romulo Okray is a 56 year-old male with history of non-insulin-dependent diabetes mellitus, hypertension, hyperlipidemia, who presents to the emergency department for chief concerns of weakness and loss of appetite for 4 days.  Of note he had a right fifth toe amputation due to diabetes.  He endorses odor at the site.  Initial vitals in the ED showed temperature of 98.5, respiration rate of 18, heart rate of 99, blood pressure 125/81, SpO2 of 98% on room air.  Serum sodium is 137, potassium 2.6, chloride of 95, bicarb 15, BUN of 52, serum creatinine 1.69, EGFR 47, nonfasting blood glucose 215, WBC 16.9, hemoglobin 10.4, platelets of 416.  High sensitive troponin was 26.  Lactic acid was 1.9.    Sed rate elevated to 127.  ED treatment: Potassium chloride 40 mill equivalent p.o. one-time dose, potassium chloride 10 mill equivalent IV, 2 doses ordered, vancomycin, metronidazole, Zosyn.  EDP consulted podiatry who will see the patient.

## 2022-11-21 NOTE — Consult Note (Signed)
Patient ID: [name], male   DOB: 11-17-1966, 56 y.o.   MRN: [id number]  HPI [name] is a 56 y.o. male in consultation at the request of Dr. Tobie Poet.  He is an uncontrolled diabetic multiple complications in the right lower extremity of the toes and comes in complaining of pain on his right foot that is intermittent that is mild.  Issue was failure to thrive weakness and loss of appetite. He Did have a CT scan that have personally reviewed showing evidence of gas tracking to the anterior compartment of the proximal tibia and fibula.  Obvious evidence of osteomyelitis and necrotizing infection of the right foot.  Visit shows a white count of 16.9 hemoglobin of 10 BMP with a potassium of 2.6 he is acidotic down to 15 on his CO2, anion of 1.69 and BUN of 52.  Glucose of 215. He has been treated at the wound center without success  HPI  Past Medical History:  Diagnosis Date   Diabetes mellitus without complication ([place])    Hyperlipidemia    Hypertension     Past Surgical History:  Procedure Laterality Date   AMPUTATION Right 04/16/2021   Procedure: AMPUTATION  RIGHT 5TH RAY, INCISION AND DRAINAGE;  Surgeon: Edrick Kins, DPM;  Location: [facility] ORS;  Service: Podiatry;  Laterality: Right;   APPLICATION OF WOUND VAC Right 04/21/2021   Procedure: APPLICATION OF WOUND VAC;  Surgeon: Edrick Kins, DPM;  Location: [facility] OR[facility];  Service: Podiatry;  Laterality: Right;   BONE BIOPSY Left 06/27/2020   Procedure: BONE BIOPSY;  Surgeon: Caroline More, DPM;  Location: [facility] OR[facility];  Service: Podiatry;  Laterality: Left;   COLONOSCOPY WITH PROPOFOL N/A 06/22/2022   Procedure: COLONOSCOPY WITH PROPOFOL;  Surgeon: [name], MD;  Location: [facility];  Service: Gastroenterology;  Laterality: N/A;   INCISION AND DRAINAGE Left 06/27/2020   Procedure: INCISION AND DRAINAGE;  Surgeon: Caroline More, DPM;  Location: [facility] OR[facility];  Service: Podiatry;  Laterality: Left;   IRRIGATION AND DEBRIDEMENT FOOT Right  04/21/2021   Procedure: IRRIGATION AND DEBRIDEMENT FOOT;  Surgeon: Edrick Kins, DPM;  Location: [facility] OR[facility];  Service: Podiatry;  Laterality: Right;   LOWER EXTREMITY ANGIOGRAPHY Left 06/30/2020   Procedure: Lower Extremity Angiography;  Surgeon: Algernon Huxley, MD;  Location: [facility];  Service: Cardiovascular;  Laterality: Left;   LOWER EXTREMITY ANGIOGRAPHY Right 04/14/2021   Procedure: Lower Extremity Angiography;  Surgeon: Algernon Huxley, MD;  Location: [facility];  Service: Cardiovascular;  Laterality: Right;   NO PAST SURGERIES     WOUND DEBRIDEMENT Right 04/19/2021   Procedure: DEBRIDEMENT WOUND;  Surgeon: Edrick Kins, DPM;  Location: [facility];  Service: Podiatry;  Laterality: Right;    Family History  Problem Relation Age of Onset   Heart disease Father    Heart attack Father    Healthy Mother    Prostate cancer Neg Hx    Kidney cancer Neg Hx    Bladder Cancer Neg Hx     Social History Social History   Tobacco Use   Smoking status: Never   Smokeless tobacco: Never  Vaping Use   Vaping Use: Never used  Substance Use Topics   Alcohol use: Yes    Alcohol/week: 6.0 - 12.0 standard drinks of alcohol    Types: 6 - 12 Cans of beer per week    Comment: Occasional   Drug use: No    No Known Allergies  Current Facility-Administered Medications  Medication Dose Route Frequency Provider Last Rate Last Admin   acetaminophen (TYLENOL) tablet 650 mg  650 mg Oral Q6H PRN Cox, Amy N, DO       Or   acetaminophen (TYLENOL) suppository 650 mg  650 mg Rectal Q6H PRN Cox, Amy N, DO       ceFEPIme (MAXIPIME) 2 g in sodium chloride 0.9 % 100 mL IVPB  2 g Intravenous Q12H Cox, Amy N, DO   Stopped at 11/21/22 2150   hydrALAZINE (APRESOLINE) injection 5 mg  5 mg Intravenous Q6H PRN Cox, Amy N, DO       insulin aspart (novoLOG) injection 0-5 Units  0-5 Units Subcutaneous QHS Cox, Amy N, DO   2 Units at 11/21/22 2252   [START ON 11/22/2022] insulin aspart (novoLOG)  injection 0-9 Units  0-9 Units Subcutaneous TID WC Cox, Amy N, DO       iohexol (OMNIPAQUE) 300 MG/ML solution 100 mL  100 mL Intravenous Once PRN Cox, Amy N, DO       lactated ringers infusion   Intravenous Continuous Cox, Amy N, DO       ondansetron (ZOFRAN) tablet 4 mg  4 mg Oral Q6H PRN Cox, Amy N, DO       Or   ondansetron (ZOFRAN) injection 4 mg  4 mg Intravenous Q6H PRN Cox, Amy N, DO       potassium chloride 10 mEq in 100 mL IVPB  10 mEq Intravenous Once Cox, Amy N, DO       potassium chloride SA (KLOR-CON M) CR tablet 40 mEq  40 mEq Oral Once Cox, Amy N, DO       senna-docusate (Senokot-S) tablet 1 tablet  1 tablet Oral QHS PRN Cox, Amy N, DO       vancomycin (VANCOREADY) IVPB 1500 mg/300 mL  1,500 mg Intravenous Once Cox, Amy N, DO       Current Outpatient Medications  Medication Sig Dispense Refill   ACCU-CHEK GUIDE test strip USE TO TEST BLOOD SUGAR UP TO FOUR TIMES DAILY AS DIRECTED 300 strip 1   Accu-Chek Softclix Lancets lancets USE TO TEST BLOOD SUGAR UP TO FOUR TIMES DAILY AS DIRECTED 300 each 1   amLODipine (NORVASC) 5 MG tablet TAKE 1 TABLET(5 MG) BY MOUTH TWICE DAILY 60 tablet 0   Blood Glucose Calibration (CONTOUR NEXT CONTROL) Normal SOLN 1 Bottle by In Vitro route every 30 (thirty) days. 1 each 3   blood glucose meter kit and supplies 1 each by Other route in the morning, at noon, and at bedtime. Dispense based on patient and insurance preference. Use up to four times daily as directed. (FOR ICD-10 E10.9, E11.9). 1 each 0   collagenase (SANTYL) 250 UNIT/GM ointment Apply 1 Application topically daily. 15 g 0   empagliflozin (JARDIANCE) 25 MG TABS tablet Take 1 tablet (25 mg total) by mouth daily. 90 tablet 1   glipiZIDE (GLUCOTROL XL) 5 MG 24 hr tablet TAKE 1 TABLET(5 MG) BY MOUTH DAILY WITH BREAKFAST 90 tablet 1   rosuvastatin (CRESTOR) 40 MG tablet TAKE 1 TABLET(40 MG) BY MOUTH DAILY 90 tablet 1   Semaglutide (RYBELSUS) 14 MG TABS Take 1 tablet (14 mg total) by mouth  daily. 90 tablet 01   tadalafil (CIALIS) 20 MG tablet Take 1 tablet (20 mg total) by mouth daily as needed for erectile dysfunction. 30 tablet 6   torsemide (DEMADEX) 10 MG tablet Take 10 mg by mouth daily.     valsartan-hydrochlorothiazide (  DIOVAN-HCT) 320-25 MG tablet Take 1 tablet by mouth daily. 90 tablet 1     Review of Systems Full ROS  was asked and was negative except for the information on the HPI  Physical Exam Blood pressure 125/81, pulse 99, temperature 97.6 F (36.4 C), temperature source Oral, resp. rate 18, height _0  (1.803 m), weight 85.7 kg, SpO2 98 %. CONSTITUTIONAL: NAD. EYES: Pupils are equal, round, and, Sclera are non-icteric. EARS, NOSE, MOUTH AND THROAT: The oropharynx is clear. The oral mucosa is pink and moist. Hearing is intact to voice. LYMPH NODES:  Lymph nodes in the neck are normal. RESPIRATORY:  Lungs are clear. There is normal respiratory effort, with equal breath sounds bilaterally, and without pathologic use of accessory muscles. CARDIOVASCULAR: Heart is regular without murmurs, gallops, or rubs. GI: The abdomen is  soft, nontender, and nondistended. There are no palpable masses. There is no hepatosplenomegaly. There are normal bowel sounds in all quadrants. GU: Rectal deferred.   Ext: There is evidence of foul  smelling pus coming from the right foot.  He had prior toe amputation, there is swelling . Faint palpable   Chronic ulceration on the plantar aspect NEUROLOGIC: Motor and sensation is grossly normal. Cranial nerves are grossly intact. PSYCH:  Oriented to person, place and time. Affect is normal.  Data Reviewed  I have personally reviewed the patient's imaging, laboratory findings and medical records.    Assessment/Plan 56 year old male with diabetic foot and necrotizing soft tissue in the right leg in need for emergent debridement. Discussed with the patient in detail about his disease process.  He understands this in the emergency.  Will  have to bite the bullet and assume potential risk in order to gain appropriate control and save his life.  Necrotizing gas-forming bacteria are very aggressive and can have significant mortality associated with these infections.  Aggressive fluid resuscitation as well.  Expectant antibiotics have been initiated  HE also understands that he may require amputation guillotine below the knee. I had an extensive discussion with him regarding my thought process.  He is in agreement.  He may need an amputation likely an guillotine amputation and vascular can revise and revascularize if indicated at some point in time for but for now do a damage control surgery is indicated.  I spent 75 minutes in this encounter including coordinating his care, pers reviewing images, counseling his care, placing orders and performing documentation   Caroleen Hamman, MD FACS General Surgeon 11/21/2022, 11:17 PM

## 2022-11-21 NOTE — ED Provider Notes (Signed)
[facility] Provider Note    Event Date/Time   First MD Initiated Contact with Patient 11/21/22 2042     (approximate)   History   Weakness   HPI  [name] is a 56 y.o. male patient with diabetes who comes in complaining of a smelly infected area on his foot.  Patient does have a very purulent odor from his foot and on and wrapping the boot he has a moist brown discharge with a swollen foot.  He is not febrile.  This is apparently been ongoing and getting worse for some time.  Patient however felt very weak today and decided to come in to be checked for the foot and the weakness.      Physical Exam   Triage Vital Signs: ED Triage Vitals  Enc Vitals Group     BP 11/21/22 1744 125/81     Pulse Rate 11/21/22 1744 99     Resp 11/21/22 1744 18     Temp 11/21/22 1744 98.5 F (36.9 C)     Temp src --      SpO2 11/21/22 1744 98 %     Weight 11/21/22 1743 189 lb (85.7 kg)     Height 11/21/22 1743 '5\' 11"'$  (1.803 m)     Head Circumference --      Peak Flow --      Pain Score 11/21/22 1743 0     Pain Loc --      Pain Edu? --      Excl. in [place]? --     Most recent vital signs: Vitals:   11/21/22 1744 11/21/22 2242  BP: 125/81   Pulse: 99   Resp: 18   Temp: 98.5 F (36.9 C) 97.6 F (36.4 C)  SpO2: 98%      General: Awake, no distress.  CV:  Good peripheral perfusion.  Heart regular rate and rhythm no audible murmurs Resp:  Normal effort.  Lungs are clear Abd:  No distention.  Soft and nontender Foot is described in HPI   ED Results / Procedures / Treatments   Labs (all labs ordered are listed, but only abnormal results are displayed) Labs Reviewed  BASIC METABOLIC PANEL - Abnormal; Notable for the following components:      Result Value   Potassium 2.6 (*)    Chloride 95 (*)    CO2 15 (*)    Glucose, Bld 215 (*)    BUN 52 (*)    Creatinine, Ser 1.69 (*)    GFR, Estimated 47 (*)    Anion gap 27 (*)    All other components  within normal limits  CBC - Abnormal; Notable for the following components:   WBC 16.9 (*)    RBC 3.72 (*)    Hemoglobin 10.4 (*)    HCT 33.2 (*)    Platelets 416 (*)    All other components within normal limits  SEDIMENTATION RATE - Abnormal; Notable for the following components:   Sed Rate 127 (*)    All other components within normal limits  BLOOD GAS, VENOUS - Abnormal; Notable for the following components:   pCO2, Ven 30 (*)    Bicarbonate 15.1 (*)    Acid-base deficit 9.8 (*)    All other components within normal limits  CBG MONITORING, ED - Abnormal; Notable for the following components:   Glucose-Capillary 212 (*)    All other components within normal limits  TROPONIN I (HIGH SENSITIVITY) - Abnormal; Notable  for the following components:   Troponin I (High Sensitivity) 29 (*)    All other components within normal limits  TROPONIN I (HIGH SENSITIVITY) - Abnormal; Notable for the following components:   Troponin I (High Sensitivity) 26 (*)    All other components within normal limits  RESP PANEL BY RT-PCR (RSV, FLU A&B, COVID)  RVPGX2  CULTURE, BLOOD (ROUTINE X 2)  CULTURE, BLOOD (ROUTINE X 2)  LACTIC ACID, PLASMA  URINALYSIS, ROUTINE W REFLEX MICROSCOPIC  SEDIMENTATION RATE  BASIC METABOLIC PANEL  CBC  HIV ANTIBODY (ROUTINE TESTING W REFLEX)  MAGNESIUM  PHOSPHORUS     EKG  EKG read interpreted by me shows sinus tachycardia rate of 102 normal axis nonspecific ST-T wave changes apparent first-degree AV block.   RADIOLOGY X-ray of the foot shows osteomyelitis and gas in the soft tissues and fairly large amount of gas.   PROCEDURES:  Critical Care performed: Critical care time 25 minutes.  This includes speaking to the patient and the patient's family and podiatry and the hospitalist.  Additionally of course I reviewed the patient's findings on x-ray and in the lab and ordered antibiotics and potassium etc.  Procedures   MEDICATIONS ORDERED IN  ED: Medications  vancomycin (VANCOREADY) IVPB 1500 mg/300 mL (has no administration in time range)  potassium chloride 10 mEq in 100 mL IVPB (has no administration in time range)  acetaminophen (TYLENOL) tablet 650 mg (has no administration in time range)    Or  acetaminophen (TYLENOL) suppository 650 mg (has no administration in time range)  ondansetron (ZOFRAN) tablet 4 mg (has no administration in time range)    Or  ondansetron (ZOFRAN) injection 4 mg (has no administration in time range)  senna-docusate (Senokot-S) tablet 1 tablet (has no administration in time range)  hydrALAZINE (APRESOLINE) injection 5 mg (has no administration in time range)  insulin aspart (novoLOG) injection 0-9 Units (has no administration in time range)  insulin aspart (novoLOG) injection 0-5 Units (2 Units Subcutaneous Given 11/21/22 2252)  ceFEPIme (MAXIPIME) 2 g in sodium chloride 0.9 % 100 mL IVPB (0 g Intravenous Stopped 11/21/22 2150)  lactated ringers infusion (has no administration in time range)  potassium chloride SA (KLOR-CON M) CR tablet 40 mEq (has no administration in time range)  rosuvastatin (CRESTOR) tablet 40 mg (has no administration in time range)  amLODipine (NORVASC) tablet 5 mg (has no administration in time range)  metroNIDAZOLE (FLAGYL) IVPB 500 mg (500 mg Intravenous New Bag/Given 11/21/22 2153)  potassium chloride 10 mEq in 100 mL IVPB (10 mEq Intravenous New Bag/Given 11/21/22 2200)  lactated ringers bolus 1,000 mL (1,000 mLs Intravenous New Bag/Given 11/21/22 2244)  iohexol (OMNIPAQUE) 300 MG/ML solution 100 mL (100 mLs Intravenous Contrast Given 11/21/22 2318)     IMPRESSION / MDM / ASSESSMENT AND PLAN / ED COURSE  I reviewed the triage vital signs and the nursing notes. Discussed patient with Dr. Amalia Hailey podiatry who is coming in to see the patient also discussed patient with hospitalist as the patient does have AKI and hypokalemia as well.  Differential diagnosis includes, but  is not limited to, gas gangrene is most likely the cause of the patient's weakness.  Have hypokalemia as well however.  Patient also has AKI.  We will check a magnesium try to replete the potassium IV troponin is essentially negative.  Patient is not coughing his lungs are clear  Patient's presentation is most consistent with acute presentation with potential threat to life or bodily function.  he  patient is on the cardiac monitor to evaluate for evidence of arrhythmia and/or significant heart rate changes.  None have been seen  X-ray of the lower leg ordered at the suggestion of podiatry that shows gas tracking all the way up to just below the knee.  Dr. Tobie Poet has contacted general surgery for this.  We have got him on antibiotics.  Most likely he will need an amputation is quite high.      FINAL CLINICAL IMPRESSION(S) / ED DIAGNOSES   Final diagnoses:  Gas gangrene ([place])     Rx / DC Orders   ED Discharge Orders     None        Note:  This document was prepared using Dragon voice recognition software and may include unintentional dictation errors.   Nena Polio, MD 11/21/22 [phone]

## 2022-11-21 NOTE — ED Triage Notes (Signed)
Wife just keeps stating "he is weak and its been going on for awhile and we dont know why".  Patient has boot on right foot and has maldorous odor coming from wound on right foot.  Patient reports his BP was low.  Reports no appetite.

## 2022-11-21 NOTE — H&P (Addendum)
History and Physical   BOMANI OOMMEN QIO:[id number] DOB: [birth year] DOA: 11/21/2022  PCP: [name], MD  Outpatient Specialists: Dr. Amalia Hailey, podiatry Patient coming from: Home  I have personally briefly reviewed patient's old medical records in [place].  Chief Concern: Right leg purulence  HPI: Mr. Kentavius Dettore is a 56 year-old male with history of non-insulin-dependent diabetes mellitus, hypertension, hyperlipidemia, who presents to the emergency department for chief concerns of weakness and loss of appetite for 4 days.  Of note he had a right fifth toe amputation due to diabetes.  He endorses odor at the site.  Initial vitals in the ED showed temperature of 98.5, respiration rate of 18, heart rate of 99, blood pressure 125/81, SpO2 of 98% on room air.  Serum sodium is 137, potassium 2.6, chloride of 95, bicarb 15, BUN of 52, serum creatinine 1.69, EGFR 47, nonfasting blood glucose 215, WBC 16.9, hemoglobin 10.4, platelets of 416.  High sensitive troponin was 26.  Lactic acid was 1.9.    Sed rate elevated to 127.  ED treatment: Potassium chloride 40 mill equivalent p.o. one-time dose, potassium chloride 10 mill equivalent IV, 2 doses ordered, vancomycin, metronidazole, Zosyn.  EDP consulted podiatry who will see the patient. -------------------------- At bedside patient was able to tell me his name, age, he knows he is in the hospital, he knows the current calendar year.  Patient and spouse at bedside reports that his right foot has had increased swelling and purulence for about 1 month.  She denies trauma to his leg.  Patient endorses poor p.o. intake over the last few days.  He denies fever, nausea, vomiting, diarrhea.  He denies dysuria, hematuria, syncope.  Social history: He lives at home with his wife.  He denies tobacco, EtOH, recreational drug use.  ROS: Constitutional: no weight change, no fever ENT/Mouth: no sore throat, no rhinorrhea Eyes: no eye pain, no  vision changes Cardiovascular: no chest pain, no dyspnea,  no edema, no palpitations Respiratory: no cough, no sputum, no wheezing Gastrointestinal: no nausea, no vomiting, no diarrhea, no constipation Genitourinary: no urinary incontinence, no dysuria, no hematuria Musculoskeletal: no arthralgias, no myalgias Skin: + skin lesions, no pruritus, + purulence and odor Neuro: + weakness, no loss of consciousness, no syncope Psych: no anxiety, no depression, + decrease appetite Heme/Lymph: no bruising, no bleeding  ED Course: Discussed with emergency medicine provider, patient requiring hospitalization for chief concerns of gas gangrene of his right foot.  Assessment/Plan  Principal Problem:   Gas gangrene of lower extremity (HCC) Active Problems:   Hypertension   Hyperglycemia due to type 2 diabetes mellitus (HCC)   HLD (hyperlipidemia)   Diabetic ulcer of foot with fat layer exposed ([place])   PAD (peripheral artery disease) (HCC)   AKI (acute kidney injury) ([place])   B12 deficiency   Soft tissue infection of foot   Elevated troponin   Hypokalemia   Assessment and Plan:  * Gas gangrene of lower extremity ([place]) Right lower extremity gas gangrene - General surgery has been consulted, Dr. Dahlia Byes is aware - CT of the lower extremity with contrast from the foot to the hip ordered - Keep n.p.o. - Continue with broad-spectrum antibiotic: Metronidazole, vancomycin, cefepime - Admit to telemetry medical, inpatient  Hypokalemia - Check serum magnesium level - Status post potassium chloride 40 mill equivalent p.o. per EDP and potassium chloride 10 mill equivalent IV, 2 doses ordered - Ordered additional potassium chloride 40 mill equivalent p.o. one-time dose as patient's PIV  is limited  Elevated troponin - I suspect this is secondary to demand ischemia in setting of acute kidney injury - Low clinical suspicion for ACS at this time, patient denies chest pain - Will continue to monitor  patient on telemetry  Soft tissue infection of foot Aggressive soft tissue infection of the right foot Osteomyelitis/septic arthritis involving the third metatarsophalangeal joint - With extensive swelling and subcutaneous gas within the dorsum right forefoot and midfoot - EDP ordered vancomycin, Zosyn, metronidazole - We will continue with vancomycin, cefepime, and metronidazole as patient has acute kidney injury - Podiatry has been consulted and will see the patient - Keep patient n.p.o. - Admit to inpatient, medical telemetry  AKI (acute kidney injury) ([place]) - Mild presumed secondary to poor p.o. intake/prerenal - LR 1 L bolus ordered and LR infusion at 150 mL/h, 1 day ordered to ensure renal perfusion as patient will need a CT of the lower extremity with contrast - AM team to consult nephrology if patient's renal function does not improve - Check BMP in the a.m.  HLD (hyperlipidemia) - Resumed home rosuvastatin 40 mg nightly  Hypertension - Amlodipine 5 mg daily resumed - Hydralazine 5 mg IV every 6 hours as needed for hypertension - Home valsartan-hydrochlorothiazide not resumed on admission due to acute kidney injury and hypokalemia; AM team to resume as appropriate   Chart reviewed.   DVT prophylaxis: TED hose Code Status: Full code Diet: N.p.o. Family Communication: Updated spouse at bedside with patient's permission Disposition Plan: Pending clinical course Consults called: Podiatry, general surgery Admission status: Telemetry medical, inpatient  Past Medical History:  Diagnosis Date   Diabetes mellitus without complication ([place])    Hyperlipidemia    Hypertension    Past Surgical History:  Procedure Laterality Date   AMPUTATION Right 04/16/2021   Procedure: AMPUTATION  RIGHT 5TH RAY, INCISION AND DRAINAGE;  Surgeon: Edrick Kins, DPM;  Location: [facility] ORS;  Service: Podiatry;  Laterality: Right;   APPLICATION OF WOUND VAC Right 04/21/2021   Procedure:  APPLICATION OF WOUND VAC;  Surgeon: Edrick Kins, DPM;  Location: [facility] ORS;  Service: Podiatry;  Laterality: Right;   BONE BIOPSY Left 06/27/2020   Procedure: BONE BIOPSY;  Surgeon: Caroline More, DPM;  Location: [facility] ORS;  Service: Podiatry;  Laterality: Left;   COLONOSCOPY WITH PROPOFOL N/A 06/22/2022   Procedure: COLONOSCOPY WITH PROPOFOL;  Surgeon: [name], MD;  Location: [facility] ENDOSCOPY;  Service: Gastroenterology;  Laterality: N/A;   INCISION AND DRAINAGE Left 06/27/2020   Procedure: INCISION AND DRAINAGE;  Surgeon: Caroline More, DPM;  Location: [facility] ORS;  Service: Podiatry;  Laterality: Left;   IRRIGATION AND DEBRIDEMENT FOOT Right 04/21/2021   Procedure: IRRIGATION AND DEBRIDEMENT FOOT;  Surgeon: Edrick Kins, DPM;  Location: [facility] ORS;  Service: Podiatry;  Laterality: Right;   LOWER EXTREMITY ANGIOGRAPHY Left 06/30/2020   Procedure: Lower Extremity Angiography;  Surgeon: Algernon Huxley, MD;  Location: [place] CV LAB;  Service: Cardiovascular;  Laterality: Left;   LOWER EXTREMITY ANGIOGRAPHY Right 04/14/2021   Procedure: Lower Extremity Angiography;  Surgeon: Algernon Huxley, MD;  Location: [place] [facility];  Service: Cardiovascular;  Laterality: Right;   NO PAST SURGERIES     WOUND DEBRIDEMENT Right 04/19/2021   Procedure: DEBRIDEMENT WOUND;  Surgeon: Edrick Kins, DPM;  Location: [facility] ORS;  Service: Podiatry;  Laterality: Right;   Social History:  reports that he has never smoked. He has never used smokeless tobacco. He reports current alcohol use of  about 6.0 - 12.0 standard drinks of alcohol per week. He reports that he does not use drugs.  No Known Allergies Family History  Problem Relation Age of Onset   Heart disease Father    Heart attack Father    Healthy Mother    Prostate cancer Neg Hx    Kidney cancer Neg Hx    Bladder Cancer Neg Hx    Family history: Family history reviewed and not pertinent  Prior to Admission medications   Medication Sig Start Date End  Date Taking? Authorizing Provider  ACCU-CHEK GUIDE test strip USE TO TEST BLOOD SUGAR UP TO FOUR TIMES DAILY AS DIRECTED 06/21/21   [name], MD  Accu-Chek Softclix Lancets lancets USE TO TEST BLOOD SUGAR UP TO FOUR TIMES DAILY AS DIRECTED 06/21/21   Ancil Boozer, Drue Stager, MD  amLODipine (NORVASC) 5 MG tablet TAKE 1 TABLET(5 MG) BY MOUTH TWICE DAILY 11/17/22   [name], MD  Blood Glucose Calibration (CONTOUR NEXT CONTROL) Normal SOLN 1 Bottle by In Vitro route every 30 (thirty) days. 05/02/21   [name], MD  blood glucose meter kit and supplies 1 each by Other route in the morning, at noon, and at bedtime. Dispense based on patient and insurance preference. Use up to four times daily as directed. (FOR ICD-10 E10.9, E11.9). 04/28/21   [name], MD  collagenase (SANTYL) 250 UNIT/GM ointment Apply 1 Application topically daily. 07/05/22   Edrick Kins, DPM  empagliflozin (JARDIANCE) 25 MG TABS tablet Take 1 tablet (25 mg total) by mouth daily. 09/05/22   Sowles, Drue Stager, MD  glipiZIDE (GLUCOTROL XL) 5 MG 24 hr tablet TAKE 1 TABLET(5 MG) BY MOUTH DAILY WITH BREAKFAST 08/25/22   Ancil Boozer, Drue Stager, MD  rosuvastatin (CRESTOR) 40 MG tablet TAKE 1 TABLET(40 MG) BY MOUTH DAILY 06/05/22   Ancil Boozer, Drue Stager, MD  Semaglutide (RYBELSUS) 14 MG TABS Take 1 tablet (14 mg total) by mouth daily. 06/05/22   [name], MD  tadalafil (CIALIS) 20 MG tablet Take 1 tablet (20 mg total) by mouth daily as needed for erectile dysfunction. 06/05/22   Zara Council A, PA-C  torsemide (DEMADEX) 10 MG tablet Take 10 mg by mouth daily.    Murlean Iba, MD  valsartan-hydrochlorothiazide (DIOVAN-HCT) 320-25 MG tablet Take 1 tablet by mouth daily. 06/05/22   [name], MD   Physical Exam: Vitals:   11/21/22 1743 11/21/22 1744 11/21/22 2242  BP:  125/81   Pulse:  99   Resp:  18   Temp:  98.5 F (36.9 C) 97.6 F (36.4 C)  TempSrc:   Oral  SpO2:  98%   Weight: 85.7 kg    Height: _0  (1.803 m)      Constitutional: appears age-appropriate, NAD, calm, comfortable Eyes: PERRL, lids and conjunctivae normal ENMT: Mucous membranes are moist. Posterior pharynx clear of any exudate or lesions. Age-appropriate dentition. Hearing appropriate Neck: normal, supple, no masses, no thyromegaly Respiratory: clear to auscultation bilaterally, no wheezing, no crackles. Normal respiratory effort. No accessory muscle use.  Cardiovascular: Regular rate and rhythm, no murmurs / rubs / gallops. No extremity edema. 2+ pedal pulses. No carotid bruits.  Abdomen: no tenderness, no masses palpated, no hepatosplenomegaly. Bowel sounds positive.  Musculoskeletal: no clubbing / cyanosis. No joint deformity upper and lower extremities. Good ROM, no contractures, no atrophy. Normal muscle tone.  Skin:     With increase odor Neurologic: Sensation intact. Strength 5/5 in all 4.  Psychiatric: Appropriate judgment and insight. Alert and oriented x 3. Normal  mood.   EKG: independently reviewed, showing sinus tachycardia with rate of 102, possible first-degree AV block  X-rays on Admission: I personally reviewed and I agree with radiologist reading as below.  DG Tibia/Fibula Right  Result Date: 11/21/2022 CLINICAL DATA:  Evaluate gas gangrene in right lower extremity, initial encounter EXAM: RIGHT TIBIA AND FIBULA - 2 VIEW COMPARISON:  Foot films from earlier in the same day. FINDINGS: There is extensive air identified in the subcutaneous tissues along the lateral aspect of the lower leg extending from the foot. These extend to the level of the knee joint. No bony erosive changes are identified. No definitive erosive changes are seen in the tibia and fibula. Stable changes in the foot are noted similar to that seen earlier on the same day. IMPRESSION: Progression of subcutaneous air to the level of the knee joint. No abnormality involving the tibia and fibula is seen. Electronically Signed   By: Inez Catalina M.D.   On:  11/21/2022 21:23   DG Foot Complete Right  Result Date: 11/21/2022 CLINICAL DATA:  Osteomyelitis EXAM: RIGHT FOOT COMPLETE - 3+ VIEW COMPARISON:  None Available. FINDINGS: Right fifth ray amputation has been performed. There is extensive soft tissue swelling of the right foot and extensive subcutaneous gas noted within the dorsum of the right forefoot and midfoot. There is, additionally, subcutaneous gas and erosive changes involving the third metatarsophalangeal joint in keeping with osteomyelitis/septic arthritis in this location. There is periosteal reaction involving the base of the fourth metatarsal as well as periarticular lucencies involving the cuboid and fourth metatarsal suspicious for changes of osteomyelitis in these locations. Advanced vascular calcifications noted. No fracture. IMPRESSION: 1. Extensive soft tissue swelling and subcutaneous gas within the dorsum of the right forefoot and midfoot. Correlation for aggressive soft tissue infection is recommended. 2. Findings in keeping with osteomyelitis/septic arthritis involving the third metatarsophalangeal joint. 3. Suspected periarticular osteomyelitis involving the base of the fourth metatarsal and cuboid. 4. Right fifth ray amputation. Electronically Signed   By: Fidela Salisbury M.D.   On: 11/21/2022 20:05    Labs on Admission: I have personally reviewed following labs  CBC: Recent Labs  Lab 11/21/22 1753  WBC 16.9*  HGB 10.4*  HCT 33.2*  MCV 89.2  PLT 045*   Basic Metabolic Panel: Recent Labs  Lab 11/21/22 1753  NA 137  K 2.6*  CL 95*  CO2 15*  GLUCOSE 215*  BUN 52*  CREATININE 1.69*  CALCIUM 10.2   GFR: Estimated Creatinine Clearance: 52.6 mL/min (A) (by C-G formula based on SCr of 1.69 mg/dL (H)).  CBG: Recent Labs  Lab 11/21/22 1750  GLUCAP 212*   Urine analysis:    Component Value Date/Time   COLORURINE YELLOW (A) 04/12/2021 2339   APPEARANCEUR CLEAR (A) 04/12/2021 2339   LABSPEC 1.023 04/12/2021  2339   PHURINE 5.0 04/12/2021 2339   GLUCOSEU >=500 (A) 04/12/2021 2339   HGBUR SMALL (A) 04/12/2021 2339   BILIRUBINUR NEGATIVE 04/12/2021 2339   KETONESUR 20 (A) 04/12/2021 2339   PROTEINUR 30 (A) 04/12/2021 2339   NITRITE NEGATIVE 04/12/2021 2339   LEUKOCYTESUR NEGATIVE 04/12/2021 2339   CRITICAL CARE Performed by: Dr. Tobie Poet  Total critical care time: 35 minutes  Critical care time was exclusive of separately billable procedures and treating other patients.  Critical care was necessary to treat or prevent imminent or life-threatening deterioration.  Critical care was time spent personally by me on the following activities: development of treatment plan with patient and/or surrogate  as well as nursing, discussions with consultants, evaluation of patient's response to treatment, examination of patient, obtaining history from patient or surrogate, ordering and performing treatments and interventions, ordering and review of laboratory studies, ordering and review of radiographic studies, pulse oximetry and re-evaluation of patient's condition.  This document was prepared using Dragon Voice Recognition software and may include unintentional dictation errors.  Dr. Tobie Poet [facility]  If 7PM-7AM, please contact overnight-coverage provider If 7AM-7PM, please contact day coverage provider [email or web address]  11/21/2022, 11:23 PM

## 2022-11-21 NOTE — Assessment & Plan Note (Addendum)
See documentation under gas gangrene

## 2022-11-22 ENCOUNTER — Inpatient Hospital Stay: Payer: Managed Care, Other (non HMO) | Admitting: Anesthesiology

## 2022-11-22 ENCOUNTER — Encounter
Admission: EM | Disposition: A | Discharge status: Home Health | Payer: Self-pay | Source: Home / Self Care | Attending: Internal Medicine

## 2022-11-22 DIAGNOSIS — M8610 Other acute osteomyelitis, unspecified site: Secondary | ICD-10-CM

## 2022-11-22 DIAGNOSIS — A480 Gas gangrene: Secondary | ICD-10-CM

## 2022-11-22 DIAGNOSIS — Z89511 Acquired absence of right leg below knee: Secondary | ICD-10-CM

## 2022-11-22 DIAGNOSIS — L089 Local infection of the skin and subcutaneous tissue, unspecified: Secondary | ICD-10-CM | POA: Diagnosis not present

## 2022-11-22 DIAGNOSIS — L97512 Non-pressure chronic ulcer of other part of right foot with fat layer exposed: Secondary | ICD-10-CM

## 2022-11-22 DIAGNOSIS — Z9889 Other specified postprocedural states: Secondary | ICD-10-CM | POA: Diagnosis not present

## 2022-11-22 DIAGNOSIS — E11621 Type 2 diabetes mellitus with foot ulcer: Secondary | ICD-10-CM | POA: Diagnosis not present

## 2022-11-22 DIAGNOSIS — Z794 Long term (current) use of insulin: Secondary | ICD-10-CM

## 2022-11-22 HISTORY — PX: I & D EXTREMITY: SHX5045

## 2022-11-22 LAB — CBC
HCT: 26.7 % — ABNORMAL LOW (ref 39.0–52.0)
Hemoglobin: 8.6 g/dL — ABNORMAL LOW (ref 13.0–17.0)
MCH: 28.8 pg (ref 26.0–34.0)
MCHC: 32.2 g/dL (ref 30.0–36.0)
MCV: 89.3 fL (ref 80.0–100.0)
Platelets: 317 10*3/uL (ref 150–400)
RBC: 2.99 MIL/uL — ABNORMAL LOW (ref 4.22–5.81)
RDW: 14 % (ref 11.5–15.5)
WBC: 18.7 10*3/uL — ABNORMAL HIGH (ref 4.0–10.5)
nRBC: 0 % (ref 0.0–0.2)

## 2022-11-22 LAB — GLUCOSE, CAPILLARY
Glucose-Capillary: 222 mg/dL — ABNORMAL HIGH (ref 70–99)
Glucose-Capillary: 186 mg/dL — ABNORMAL HIGH (ref 70–99)
Glucose-Capillary: 226 mg/dL — ABNORMAL HIGH (ref 70–99)
Glucose-Capillary: 300 mg/dL — ABNORMAL HIGH (ref 70–99)
Glucose-Capillary: 191 mg/dL — ABNORMAL HIGH (ref 70–99)
Glucose-Capillary: 166 mg/dL — ABNORMAL HIGH (ref 70–99)

## 2022-11-22 LAB — BASIC METABOLIC PANEL
Anion gap: 20 — ABNORMAL HIGH (ref 5–15)
BUN: 45 mg/dL — ABNORMAL HIGH (ref 6–20)
CO2: 14 mmol/L — ABNORMAL LOW (ref 22–32)
Calcium: 9 mg/dL (ref 8.9–10.3)
Chloride: 103 mmol/L (ref 98–111)
Creatinine, Ser: 1.39 mg/dL — ABNORMAL HIGH (ref 0.61–1.24)
GFR, Estimated: 60 mL/min — ABNORMAL LOW (ref >60)
Glucose, Bld: 205 mg/dL — ABNORMAL HIGH (ref 70–99)
Potassium: 2.8 mmol/L — ABNORMAL LOW (ref 3.5–5.1)
Sodium: 137 mmol/L (ref 135–145)

## 2022-11-22 LAB — MAGNESIUM: Magnesium: 2.2 mg/dL (ref 1.7–2.4)

## 2022-11-22 LAB — CBG MONITORING, ED: Glucose-Capillary: 172 mg/dL — ABNORMAL HIGH (ref 70–99)

## 2022-11-22 LAB — HIV ANTIBODY (ROUTINE TESTING W REFLEX): HIV Screen 4th Generation wRfx: NONREACTIVE

## 2022-11-22 LAB — PHOSPHORUS: Phosphorus: 3.6 mg/dL (ref 2.5–4.6)

## 2022-11-22 LAB — SEDIMENTATION RATE: Sed Rate: 107 mm/hr — ABNORMAL HIGH (ref 0–20)

## 2022-11-22 SURGERY — IRRIGATION AND DEBRIDEMENT EXTREMITY
Anesthesia: General | Site: Leg Lower | Laterality: Right

## 2022-11-22 MED ORDER — METRONIDAZOLE 500 MG/100ML IV SOLN
500.0000 mg | Freq: Two times a day (BID) | INTRAVENOUS | Status: DC
  Administered 2022-11-22 – 2022-11-25 (×7): 500 mg via INTRAVENOUS
  Filled 2022-11-22 (×9): qty 100

## 2022-11-22 MED ORDER — ROCURONIUM BROMIDE 100 MG/10ML IV SOLN
INTRAVENOUS | Status: DC | PRN
  Administered 2022-11-22: 50 mg via INTRAVENOUS
  Administered 2022-11-22: 20 mg via INTRAVENOUS

## 2022-11-22 MED ORDER — MORPHINE SULFATE (PF) 2 MG/ML IV SOLN
2.0000 mg | INTRAVENOUS | Status: DC | PRN
  Administered 2022-11-22 – 2022-11-25 (×4): 2 mg via INTRAVENOUS
  Filled 2022-11-22 (×5): qty 1

## 2022-11-22 MED ORDER — SEVOFLURANE IN SOLN
RESPIRATORY_TRACT | Status: AC
  Filled 2022-11-22: qty 250

## 2022-11-22 MED ORDER — OXYCODONE HCL 5 MG/5ML PO SOLN: 5.0000 mg | Freq: Once | ORAL | Status: AC | PRN

## 2022-11-22 MED ORDER — PHENYLEPHRINE HCL-NACL 20-0.9 MG/250ML-% IV SOLN
INTRAVENOUS | Status: AC
  Filled 2022-11-22: qty 250

## 2022-11-22 MED ORDER — LIDOCAINE HCL (PF) 2 % IJ SOLN
INTRAMUSCULAR | Status: AC
  Filled 2022-11-22: qty 5

## 2022-11-22 MED ORDER — PROPOFOL 10 MG/ML IV BOLUS
INTRAVENOUS | Status: AC
  Filled 2022-11-22: qty 20

## 2022-11-22 MED ORDER — LIDOCAINE HCL (CARDIAC) PF 100 MG/5ML IV SOSY
PREFILLED_SYRINGE | INTRAVENOUS | Status: DC | PRN
  Administered 2022-11-22: 100 mg via INTRAVENOUS

## 2022-11-22 MED ORDER — FENTANYL CITRATE (PF) 100 MCG/2ML IJ SOLN
INTRAMUSCULAR | Status: AC
  Filled 2022-11-22: qty 2

## 2022-11-22 MED ORDER — PHENYLEPHRINE 80 MCG/ML (10ML) SYRINGE FOR IV PUSH (FOR BLOOD PRESSURE SUPPORT)
PREFILLED_SYRINGE | INTRAVENOUS | Status: AC
  Filled 2022-11-22: qty 10

## 2022-11-22 MED ORDER — ACETAMINOPHEN 10 MG/ML IV SOLN
1000.0000 mg | Freq: Once | INTRAVENOUS | Status: DC | PRN
  Administered 2022-11-22: 1000 mg via INTRAVENOUS

## 2022-11-22 MED ORDER — PROMETHAZINE HCL 25 MG/ML IJ SOLN: 6.2500 mg | INTRAMUSCULAR | Status: DC | PRN

## 2022-11-22 MED ORDER — ROCURONIUM BROMIDE 10 MG/ML (PF) SYRINGE
PREFILLED_SYRINGE | INTRAVENOUS | Status: AC
  Filled 2022-11-22: qty 10

## 2022-11-22 MED ORDER — ONDANSETRON HCL 4 MG/2ML IJ SOLN
INTRAMUSCULAR | Status: AC
  Filled 2022-11-22: qty 2

## 2022-11-22 MED ORDER — FENTANYL CITRATE PF 50 MCG/ML IJ SOSY
PREFILLED_SYRINGE | INTRAMUSCULAR | Status: AC
  Filled 2022-11-22: qty 1

## 2022-11-22 MED ORDER — DROPERIDOL 2.5 MG/ML IJ SOLN: 0.6250 mg | Freq: Once | INTRAMUSCULAR | Status: DC | PRN

## 2022-11-22 MED ORDER — ACETAMINOPHEN 10 MG/ML IV SOLN
INTRAVENOUS | Status: AC
  Filled 2022-11-22: qty 100

## 2022-11-22 MED ORDER — PHENYLEPHRINE HCL (PRESSORS) 10 MG/ML IV SOLN
INTRAVENOUS | Status: DC | PRN
  Administered 2022-11-22: 80 ug via INTRAVENOUS
  Administered 2022-11-22: 160 ug via INTRAVENOUS
  Administered 2022-11-22 (×4): 80 ug via INTRAVENOUS

## 2022-11-22 MED ORDER — PROPOFOL 10 MG/ML IV BOLUS
INTRAVENOUS | Status: DC | PRN
  Administered 2022-11-22: 120 mg via INTRAVENOUS

## 2022-11-22 MED ORDER — PHENYLEPHRINE HCL-NACL 20-0.9 MG/250ML-% IV SOLN
INTRAVENOUS | Status: DC | PRN
  Administered 2022-11-22: 25 ug/min via INTRAVENOUS

## 2022-11-22 MED ORDER — HEPARIN SODIUM (PORCINE) 5000 UNIT/ML IJ SOLN
5000.0000 [IU] | Freq: Three times a day (TID) | INTRAMUSCULAR | Status: DC
  Administered 2022-11-22 – 2022-11-30 (×24): 5000 [IU] via SUBCUTANEOUS
  Filled 2022-11-22 (×23): qty 1

## 2022-11-22 MED ORDER — OXYCODONE HCL 5 MG PO TABS
5.0000 mg | ORAL_TABLET | Freq: Once | ORAL | Status: AC | PRN
  Administered 2022-11-22: 5 mg via ORAL

## 2022-11-22 MED ORDER — MIDAZOLAM HCL 2 MG/2ML IJ SOLN
INTRAMUSCULAR | Status: AC
  Filled 2022-11-22: qty 2

## 2022-11-22 MED ORDER — POTASSIUM CHLORIDE CRYS ER 20 MEQ PO TBCR
40.0000 meq | EXTENDED_RELEASE_TABLET | ORAL | Status: AC
  Administered 2022-11-22 (×3): 40 meq via ORAL
  Filled 2022-11-22 (×3): qty 2

## 2022-11-22 MED ORDER — SODIUM CHLORIDE 0.9 % IV SOLN
2.0000 g | Freq: Three times a day (TID) | INTRAVENOUS | Status: DC
  Administered 2022-11-22 – 2022-11-24 (×6): 2 g via INTRAVENOUS
  Filled 2022-11-22 (×2): qty 12.5
  Filled 2022-11-22: qty 2
  Filled 2022-11-22 (×2): qty 12.5
  Filled 2022-11-22 (×2): qty 2

## 2022-11-22 MED ORDER — OXYCODONE HCL 5 MG PO TABS
ORAL_TABLET | ORAL | Status: AC
  Filled 2022-11-22: qty 1

## 2022-11-22 MED ORDER — EPHEDRINE SULFATE (PRESSORS) 50 MG/ML IJ SOLN
INTRAMUSCULAR | Status: DC | PRN
  Administered 2022-11-22 (×2): 10 mg via INTRAVENOUS

## 2022-11-22 MED ORDER — ORAL CARE MOUTH RINSE: 15.0000 mL | OROMUCOSAL | Status: DC | PRN

## 2022-11-22 MED ORDER — SODIUM CHLORIDE 0.9 % IV SOLN: INTRAVENOUS | Status: DC | PRN

## 2022-11-22 MED ORDER — ACETAMINOPHEN 325 MG PO TABS
650.0000 mg | ORAL_TABLET | Freq: Four times a day (QID) | ORAL | Status: DC | PRN
  Administered 2022-11-29 – 2022-11-30 (×3): 650 mg via ORAL
  Filled 2022-11-22 (×3): qty 2

## 2022-11-22 MED ORDER — VANCOMYCIN HCL 1500 MG/300ML IV SOLN
1500.0000 mg | INTRAVENOUS | Status: DC
  Administered 2022-11-23: 1500 mg via INTRAVENOUS
  Filled 2022-11-22: qty 300

## 2022-11-22 MED ORDER — FENTANYL CITRATE (PF) 100 MCG/2ML IJ SOLN
25.0000 ug | INTRAMUSCULAR | Status: DC | PRN
  Administered 2022-11-22 (×2): 25 ug via INTRAVENOUS
  Administered 2022-11-22: 50 ug via INTRAVENOUS
  Administered 2022-11-22 (×2): 25 ug via INTRAVENOUS

## 2022-11-22 MED ORDER — 0.9 % SODIUM CHLORIDE (POUR BTL) OPTIME
TOPICAL | Status: DC | PRN
  Administered 2022-11-22: 1000 mL

## 2022-11-22 MED ORDER — OXYCODONE HCL 5 MG PO TABS
5.0000 mg | ORAL_TABLET | ORAL | Status: DC | PRN
  Administered 2022-11-22 – 2022-11-30 (×16): 10 mg via ORAL
  Filled 2022-11-22 (×18): qty 2

## 2022-11-22 MED ORDER — ONDANSETRON HCL 4 MG/2ML IJ SOLN
INTRAMUSCULAR | Status: DC | PRN
  Administered 2022-11-22: 4 mg via INTRAVENOUS

## 2022-11-22 MED ORDER — SUGAMMADEX SODIUM 200 MG/2ML IV SOLN
INTRAVENOUS | Status: DC | PRN
  Administered 2022-11-22: 200 mg via INTRAVENOUS

## 2022-11-22 MED ORDER — FENTANYL CITRATE (PF) 100 MCG/2ML IJ SOLN
INTRAMUSCULAR | Status: DC | PRN
  Administered 2022-11-22: 100 ug via INTRAVENOUS

## 2022-11-22 SURGICAL SUPPLY — 35 items
ADHESIVE MASTISOL STRL (MISCELLANEOUS) IMPLANT
BLADE CLIPPER SURG (BLADE) ×1 IMPLANT
BLADE SAGITTAL WIDE XTHICK NO (BLADE) IMPLANT
BLADE SAW GIGLI 510 (BLADE) IMPLANT
BLADE SURG 15 STRL LF DISP TIS (BLADE) ×1 IMPLANT
BLADE SURG 15 STRL SS (BLADE)
BNDG COHESIVE 4X5 TAN STRL LF (GAUZE/BANDAGES/DRESSINGS) IMPLANT
CANISTER WOUND CARE 500ML ATS (WOUND CARE) IMPLANT
DRAPE 3/4 80X56 (DRAPES) IMPLANT
DRAPE EXTREMITY 106X87X128.5 (DRAPES) IMPLANT
DRAPE LAPAROTOMY TRNSV 106X77 (MISCELLANEOUS) ×1 IMPLANT
DRSG VAC GRANUFOAM LG (GAUZE/BANDAGES/DRESSINGS) IMPLANT
ELECT CAUTERY BLADE 6.4 (BLADE) ×1 IMPLANT
ELECT REM PT RETURN 9FT ADLT (ELECTROSURGICAL) ×1
ELECTRODE REM PT RTRN 9FT ADLT (ELECTROSURGICAL) ×1 IMPLANT
GAUZE 4X4 16PLY ~~LOC~~+RFID DBL (SPONGE) ×1 IMPLANT
GLOVE BIO SURGEON STRL SZ7 (GLOVE) ×1 IMPLANT
GOWN STRL REUS W/ TWL LRG LVL3 (GOWN DISPOSABLE) ×2 IMPLANT
GOWN STRL REUS W/TWL LRG LVL3 (GOWN DISPOSABLE) ×2
MANIFOLD NEPTUNE II (INSTRUMENTS) ×1 IMPLANT
NEEDLE HYPO 22GX1.5 SAFETY (NEEDLE) ×1 IMPLANT
NS IRRIG 1000ML POUR BTL (IV SOLUTION) ×1 IMPLANT
PACK BASIN MINOR ARMC (MISCELLANEOUS) ×1 IMPLANT
SOL PREP PVP 2OZ (MISCELLANEOUS) ×1
SOLUTION PREP PVP 2OZ (MISCELLANEOUS) ×1 IMPLANT
SPONGE T-LAP 18X18 ~~LOC~~+RFID (SPONGE) ×1 IMPLANT
STOCKINETTE M/LG 89821 (MISCELLANEOUS) IMPLANT
SUT SILK 2 0 (SUTURE) ×1
SUT SILK 2 0SH CR/8 30 (SUTURE) IMPLANT
SUT SILK 2-0 18XBRD TIE 12 (SUTURE) IMPLANT
SWAB CULTURE AMIES ANAERIB BLU (MISCELLANEOUS) IMPLANT
SYR 20ML LL LF (SYRINGE) ×1 IMPLANT
TRAP FLUID SMOKE EVACUATOR (MISCELLANEOUS) ×1 IMPLANT
TRAY FOLEY MTR SLVR 16FR STAT (SET/KITS/TRAYS/PACK) IMPLANT
WATER STERILE IRR 500ML POUR (IV SOLUTION) ×1 IMPLANT

## 2022-11-22 NOTE — H&P (View-Only) (Signed)
[name] Note  MRN : [id number]  [name] is a 56 y.o. (16-Nov-1966) male who presents with chief complaint of  Chief Complaint  Patient presents with   Weakness  .   Consulting Physician:Dr. Caroleen Hamman MD.  Reason for consult:Diabetic foot with Necrotizing soft tissue infection of the right leg. Now S/P right BKA History of Present Illness:   [name] is a 56 year-old male with history of non-insulin-dependent diabetes mellitus, hypertension, hyperlipidemia, who presents to the emergency department for chief concerns of weakness and loss of appetite for 4 days.   Of note he had a right fifth toe amputation due to diabetes.  He endorses odor at the site.   Physical exam shows patient now status post guillotine below the knee amputation of right lower extremity.  Wound VAC in place draining appropriately. Patient reports pain. No other concerns at this time.   Current Facility-Administered Medications  Medication Dose Route Frequency Provider Last Rate Last Admin   acetaminophen (TYLENOL) tablet 650 mg  650 mg Oral Q6H PRN Sreenath, Sudheer B, MD       ceFEPIme (MAXIPIME) 2 g in sodium chloride 0.9 % 100 mL IVPB  2 g Intravenous Q8H Sreenath, Sudheer B, MD       heparin injection 5,000 Units  5,000 Units Subcutaneous Q8H Sreenath, Sudheer B, MD       hydrALAZINE (APRESOLINE) injection 5 mg  5 mg Intravenous Q6H PRN Pabon, Diego F, MD       insulin aspart (novoLOG) injection 0-5 Units  0-5 Units Subcutaneous QHS Caroleen Hamman F, MD   2 Units at 11/21/22 2252   insulin aspart (novoLOG) injection 0-9 Units  0-9 Units Subcutaneous TID WC Pabon, Diego F, MD   7 Units at 11/22/22 1129   lactated ringers infusion   Intravenous Continuous Pabon, Diego F, MD 150 mL/hr at 11/22/22 1042 New Bag at 11/22/22 1042   metroNIDAZOLE (FLAGYL) IVPB 500 mg  500 mg Intravenous Q12H Ralene Muskrat B, MD 100 mL/hr at 11/22/22 1132 500 mg at 11/22/22  1132   morphine (PF) 2 MG/ML injection 2 mg  2 mg Intravenous Q3H PRN Ralene Muskrat B, MD   2 mg at 11/22/22 1447   ondansetron (ZOFRAN) tablet 4 mg  4 mg Oral Q6H PRN Pabon, Diego F, MD       Or   ondansetron (ZOFRAN) injection 4 mg  4 mg Intravenous Q6H PRN Pabon, Diego F, MD       Oral care mouth rinse  15 mL Mouth Rinse PRN Sreenath, Sudheer B, MD       oxyCODONE (Oxy IR/ROXICODONE) immediate release tablet 5-10 mg  5-10 mg Oral Q4H PRN Ralene Muskrat B, MD   10 mg at 11/22/22 1447   potassium chloride 10 mEq in 100 mL IVPB  10 mEq Intravenous Once Pabon, Diego F, MD       rosuvastatin (CRESTOR) tablet 40 mg  40 mg Oral QHS Pabon, Diego F, MD       senna-docusate (Senokot-S) tablet 1 tablet  1 tablet Oral QHS PRN Pabon, Diego F, MD       sodium chloride 0.9 % bolus 1,000 mL  1,000 mL Intravenous Once Pabon, Diego F, MD       vancomycin (VANCOREADY) IVPB 1500 mg/300 mL  1,500 mg Intravenous Q24H Sidney Ace, MD        Past Medical History:  Diagnosis Date   Diabetes mellitus  without complication ([place])    Hyperlipidemia    Hypertension     Past Surgical History:  Procedure Laterality Date   AMPUTATION Right 04/16/2021   Procedure: AMPUTATION  RIGHT 5TH RAY, INCISION AND DRAINAGE;  Surgeon: Edrick Kins, DPM;  Location: [facility];  Service: Podiatry;  Laterality: Right;   APPLICATION OF WOUND VAC Right 04/21/2021   Procedure: APPLICATION OF WOUND VAC;  Surgeon: Edrick Kins, DPM;  Location: [facility];  Service: Podiatry;  Laterality: Right;   BONE BIOPSY Left 06/27/2020   Procedure: BONE BIOPSY;  Surgeon: Caroline More, DPM;  Location: [facility];  Service: Podiatry;  Laterality: Left;   COLONOSCOPY WITH PROPOFOL N/A 06/22/2022   Procedure: COLONOSCOPY WITH PROPOFOL;  Surgeon: [name], MD;  Location: [facility];  Service: Gastroenterology;  Laterality: N/A;   INCISION AND DRAINAGE Left 06/27/2020   Procedure: INCISION AND DRAINAGE;  Surgeon: Caroline More, DPM;   Location: [facility] OR[facility];  Service: Podiatry;  Laterality: Left;   IRRIGATION AND DEBRIDEMENT FOOT Right 04/21/2021   Procedure: IRRIGATION AND DEBRIDEMENT FOOT;  Surgeon: Edrick Kins, DPM;  Location: [facility];  Service: Podiatry;  Laterality: Right;   LOWER EXTREMITY ANGIOGRAPHY Left 06/30/2020   Procedure: Lower Extremity Angiography;  Surgeon: Algernon Huxley, MD;  Location: [place] [facility];  Service: Cardiovascular;  Laterality: Left;   LOWER EXTREMITY ANGIOGRAPHY Right 04/14/2021   Procedure: Lower Extremity Angiography;  Surgeon: Algernon Huxley, MD;  Location: [place] [facility];  Service: Cardiovascular;  Laterality: Right;   NO PAST SURGERIES     WOUND DEBRIDEMENT Right 04/19/2021   Procedure: DEBRIDEMENT WOUND;  Surgeon: Edrick Kins, DPM;  Location: [facility];  Service: Podiatry;  Laterality: Right;    Social History Social History   Tobacco Use   Smoking status: Never   Smokeless tobacco: Never  Vaping Use   Vaping Use: Never used  Substance Use Topics   Alcohol use: Yes    Alcohol/week: 6.0 - 12.0 standard drinks of alcohol    Types: 6 - 12 Cans of beer per week    Comment: Occasional   Drug use: No    Family History Family History  Problem Relation Age of Onset   Heart disease Father    Heart attack Father    Healthy Mother    Prostate cancer Neg Hx    Kidney cancer Neg Hx    Bladder Cancer Neg Hx     No Known Allergies   REVIEW OF SYSTEMS (Negative unless checked)  Constitutional: '[]'$ Weight loss  '[]'$ Fever  '[]'$ Chills Cardiac: '[]'$ Chest pain   '[]'$ Chest pressure   '[]'$ Palpitations   '[]'$ Shortness of breath when laying flat   '[]'$ Shortness of breath at rest   '[]'$ Shortness of breath with exertion. Vascular:  '[x]'$ Pain in legs with walking   '[]'$ Pain in legs at rest   '[]'$ Pain in legs when laying flat   '[]'$ Claudication   '[]'$ Pain in feet when walking  '[]'$ Pain in feet at rest  '[]'$ Pain in feet when laying flat   '[]'$ History of DVT   '[]'$ Phlebitis   '[x]'$ Swelling in legs   '[]'$ Varicose veins    '[]'$ Non-healing ulcers Pulmonary:   '[]'$ Uses home oxygen   '[]'$ Productive cough   '[]'$ Hemoptysis   '[]'$ Wheeze  '[]'$ COPD   '[]'$ Asthma Neurologic:  '[]'$ Dizziness  '[]'$ Blackouts   '[]'$ Seizures   '[]'$ History of stroke   '[]'$ History of TIA  '[]'$ Aphasia   '[]'$ Temporary blindness   '[]'$ Dysphagia   '[]'$ Weakness or numbness in arms   '[]'$ Weakness or numbness in  legs Musculoskeletal:  '[]'$ Arthritis   '[]'$ Joint swelling   '[]'$ Joint pain   '[]'$ Low back pain Hematologic:  '[]'$ Easy bruising  '[]'$ Easy bleeding   '[]'$ Hypercoagulable state   '[]'$ Anemic  '[]'$ Hepatitis Gastrointestinal:  '[]'$ Blood in stool   '[]'$ Vomiting blood  '[]'$ Gastroesophageal reflux/heartburn   '[]'$ Difficulty swallowing. Genitourinary:  '[]'$ Chronic kidney disease   '[]'$ Difficult urination  '[]'$ Frequent urination  '[]'$ Burning with urination   '[]'$ Blood in urine Skin:  '[]'$ Rashes   '[]'$ Ulcers   '[]'$ Wounds Psychological:  '[]'$ History of anxiety   '[]'$  History of major depression.  Physical Examination  Vitals:   11/22/22 0740 11/22/22 1100 11/22/22 1122 11/22/22 1601  BP: 137/74  105/67 124/71  Pulse: 81  77 78  Resp: 14   16  Temp: (!) 96.8 F (36 C) (!) 96.2 F (35.7 C)  (!) 97.4 F (36.3 C)  TempSrc: Axillary Axillary    SpO2: 100%  100% 100%  Weight:      Height:       Body mass index is 23.14 kg/m. Gen:  WD/WN, NAD Head: [place]/AT, No temporalis wasting. Prominent temp pulse not noted. Ear/Nose/Throat: Hearing grossly intact, nares w/o erythema or drainage, oropharynx w/o Erythema/Exudate Eyes: Sclera non-icteric, conjunctiva clear Neck: Trachea midline.  No JVD.  Pulmonary:  Good air movement, respirations not labored, equal bilaterally.  Cardiac: RRR, normal S1, S2. Vascular: Post guillotine right below the knee amputation.  This wound is covered with a wound VAC prior to returning to the operating room to proceed with right above-the-knee amputation.  If lower extremity is completely intact.  No sores or infection of the note.  This is are palpable. Vessel Right Left  Radial Palpable Palpable  Ulnar     Brachial    Carotid    Aorta    Femoral  Palpable  Popliteal N/A Palpable  PT N/A Palpable  DP N/A Palpable   Gastrointestinal: soft, non-tender/non-distended. No guarding/reflex.  Musculoskeletal: M/S 5/5 throughout.  Extremities with ischemic changes.  No deformity or atrophy. No edema. Neurologic: Sensation grossly intact in extremities.  Symmetrical.  Speech is fluent. Motor exam as listed above. Psychiatric: Judgment intact, Mood & affect appropriate for pt's clinical situation. Dermatologic: No rashes or ulcers noted.  No cellulitis or open wounds. Lymph : No Cervical, Axillary, or Inguinal lymphadenopathy.    CBC Lab Results  Component Value Date   WBC 18.7 (H) 11/22/2022   HGB 8.6 (L) 11/22/2022   HCT 26.7 (L) 11/22/2022   MCV 89.3 11/22/2022   PLT 317 11/22/2022    BMET    Component Value Date/Time   NA 137 11/22/2022 0459   NA 141 09/06/2022 1417   K 2.8 (L) 11/22/2022 0459   CL 103 11/22/2022 0459   CO2 14 (L) 11/22/2022 0459   GLUCOSE 205 (H) 11/22/2022 0459   BUN 45 (H) 11/22/2022 0459   BUN 25 (H) 09/06/2022 1417   CREATININE 1.39 (H) 11/22/2022 0459   CREATININE 0.79 11/09/2021 0902   CALCIUM 9.0 11/22/2022 0459   GFRNONAA 60 (L) 11/22/2022 0459   GFRNONAA 83 02/09/2021 1135   GFRAA 96 02/09/2021 1135   Estimated Creatinine Clearance: 64 mL/min (A) (by C-G formula based on SCr of 1.39 mg/dL (H)).  COAG No results found for: "INR", "PROTIME"  Radiology CT EXTREMITY LOWER RIGHT W CONTRAST  Result Date: 11/22/2022 CLINICAL DATA:  Osteomyelitis suspected, foot, xray done gas gangrene at knee on xray, please scan from foot to pelvis. Thank you. EXAM: CT OF THE LOWER RIGHT EXTREMITY WITH CONTRAST TECHNIQUE: Multidetector  CT imaging of the lower right extremity was performed according to the standard protocol following intravenous contrast administration. RADIATION DOSE REDUCTION: This exam was performed according to the departmental dose-optimization  program which includes automated exposure control, adjustment of the mA and/or kV according to patient size and/or use of iterative reconstruction technique. CONTRAST:  165m OMNIPAQUE IOHEXOL 300 MG/ML  SOLN COMPARISON:  Tibia/fibula and foot radiographs earlier today. FINDINGS: Imaging obtained from the hip to the foot. Bones/Joint/Cartilage Patchy areas of decreased density and erosions throughout the tarsal bones. There are erosive changes in the cuboid, all cuneiform so and to a lesser extent navicular. There is intra osseous air within the navicular. Patchy air within the intertarsal joints and subtalar joint the foot. Previous resection of the fifth ray. There is mild smooth periosteal thickening about the distal tibia and fibula.Well corticated defect of the lateral femoral condyle at the knee joint appears chronic. Ligaments Suboptimally assessed by CT. Muscles and Tendons Diffuse intramuscular air and fluid collections throughout the foot. The largest air-fluid collection is in the dorsum of the foot, 6.3 x 2.6 cm series 2, image 653. Intramuscular gas tracks in the anterior compartment of the lower leg to the level of the proximal tibia/fibular articulation. No intramuscular gas proximal to the knee. Soft tissues Circumferential subcutaneous edema about the distal aspect of the lower leg. There is subcutaneous edema and skin thickening throughout the foot. Patchy soft tissue gas throughout the foot with a large plantar soft tissue defect extending to the cuboid. IMPRESSION: 1. Plantar soft tissue defect in the foot extending to the cuboid with osteomyelitis. Osteomyelitis involves the tarsal bones, cuboid, all cuneiforms and navicular. There is also intraosseous air in the navicular. 2. Extensive soft tissue and intramuscular gas extending from the level of the plantar wound throughout the foot. Heterogeneous air-fluid collections throughout the foot with dominant collection dorsally. Intra-articular air  throughout the midfoot and subtalar joint. Findings consistent with gas gangrene and possible necrotizing soft tissue infection. 3. Soft tissue and intramuscular gas tracks proximally in the anterior compartment to the level of the proximal tibia/fibular articulation. Smooth periosteal thickening of the mid and distal tibia and fibula have a chronic appearance. These results were called by telephone at the time of interpretation on 11/22/2022 at 12:50 am to provider NP KJerrye Bushy, who verbally acknowledged these results. Electronically Signed   By: MKeith RakeM.D.   On: 11/22/2022 00:51   DG Tibia/Fibula Right  Result Date: 11/21/2022 CLINICAL DATA:  Evaluate gas gangrene in right lower extremity, initial encounter EXAM: RIGHT TIBIA AND FIBULA - 2 VIEW COMPARISON:  Foot films from earlier in the same day. FINDINGS: There is extensive air identified in the subcutaneous tissues along the lateral aspect of the lower leg extending from the foot. These extend to the level of the knee joint. No bony erosive changes are identified. No definitive erosive changes are seen in the tibia and fibula. Stable changes in the foot are noted similar to that seen earlier on the same day. IMPRESSION: Progression of subcutaneous air to the level of the knee joint. No abnormality involving the tibia and fibula is seen. Electronically Signed   By: MInez CatalinaM.D.   On: 11/21/2022 21:23   DG Foot Complete Right  Result Date: 11/21/2022 CLINICAL DATA:  Osteomyelitis EXAM: RIGHT FOOT COMPLETE - 3+ VIEW COMPARISON:  None Available. FINDINGS: Right fifth ray amputation has been performed. There is extensive soft tissue swelling of the right foot and extensive  subcutaneous gas noted within the dorsum of the right forefoot and midfoot. There is, additionally, subcutaneous gas and erosive changes involving the third metatarsophalangeal joint in keeping with osteomyelitis/septic arthritis in this location. There is periosteal  reaction involving the base of the fourth metatarsal as well as periarticular lucencies involving the cuboid and fourth metatarsal suspicious for changes of osteomyelitis in these locations. Advanced vascular calcifications noted. No fracture. IMPRESSION: 1. Extensive soft tissue swelling and subcutaneous gas within the dorsum of the right forefoot and midfoot. Correlation for aggressive soft tissue infection is recommended. 2. Findings in keeping with osteomyelitis/septic arthritis involving the third metatarsophalangeal joint. 3. Suspected periarticular osteomyelitis involving the base of the fourth metatarsal and cuboid. 4. Right fifth ray amputation. Electronically Signed   By: Fidela Salisbury M.D.   On: 11/21/2022 20:05      Assessment/Plan 1. Gas Gangrene of the right lower extremity:   Patient is now status post right guillotine below the knee amputation.  CT of the right lower extremity reveals plantar soft tissue defect in the foot that extends to the cuboid with osteomyelitis.  Also intraosseous air is in the foot.  Extensive soft tissue and intramuscular gas extends from the level of the plantar foot wound through the foot.  Soft tissue and intramuscular gas tracks approximately in the anterior compartment to the level of the proximal tibia/fibular articulation.  Findings are consistent with gas gangrene green and possible necrotizing soft tissue infection.  The patient has been placed on the schedule for an above-the-knee amputation on the right lower extremity.  I had a long discussion with the patient this morning concerning the findings of the CTA.  I explained to the patient that from a vascular standpoint he is necrotizing tissue damage and gas gangrene extending into his knee.  For he will need an above-the-knee amputation to remove any infection and dead tissue he came to the emergency room with.  I discussed in detail with him the procedure, benefits, risks, and complications.  He asked  questions and they were all answered.  He verbalizes his understanding.  Wishes to proceed with an above the knee amputation of his right lower extremity.  Will schedule him for this surgery on Friday, 11/24/2022.   Plan of care discussed with Dr Leotis Pain MD and he is in agreement with plan noted above.   Family Communication:  Total Time:75 I spent 75 minutes in this encounter including personally reviewing extensive medical records, personally reviewing imaging studies and compared to prior scans, counseling the patient, placing orders, coordinating care and performing appropriate documentation  Thank you for allowing [place] to participate in the care of this patient.   Drema Pry, NP [facility] [phone] (Office Phone) [phone] (Office Fax) [phone] (Pager)  11/22/2022 4:26 PM  Staff may message me via secure chat in [place]  but this may not receive immediate response,  please page for urgent matters!  Dictation software was used to generate the above note. Typos may occur and escape review, as with typed/written notes. Any error is purely unintentional.  Please contact me directly for clarity if needed.

## 2022-11-22 NOTE — Progress Notes (Signed)
Patient admitted to the unit and a skin assessment was completed by myself and Rockwell Alexandria. The patient has a new right BKA and a skin tag located on his back. No other skin issues noted at this time.

## 2022-11-22 NOTE — Anesthesia Preprocedure Evaluation (Signed)
Anesthesia Evaluation  Patient identified by MRN, date of birth, ID band Patient awake    Reviewed: Allergy & Precautions, NPO status , Patient's Chart, lab work & pertinent test results  Airway Mallampati: III  TM Distance: >3 FB Neck ROM: full    Dental no notable dental hx.    Pulmonary neg pulmonary ROS   Pulmonary exam normal        Cardiovascular hypertension, (-) angina + Peripheral Vascular Disease  (-) Past MI Normal cardiovascular exam     Neuro/Psych negative neurological ROS  negative psych ROS   GI/Hepatic negative GI ROS, Neg liver ROS,,,  Endo/Other  diabetes, Type 2, Oral Hypoglycemic Agents    Renal/GU Renal diseaseAKI  negative genitourinary   Musculoskeletal   Abdominal Normal abdominal exam  (+)   Peds  Hematology negative hematology ROS (+)   Anesthesia Other Findings Hypokalemia- Weakness per ED report. Pt denies weakness/fatigue/palpitations. EKG reviewed with prolonged PR interval.  Past Medical History: No date: Diabetes mellitus without complication ([facility]) No date: Hyperlipidemia No date: Hypertension  Past Surgical History: 04/16/2021: AMPUTATION; Right     Comment:  Procedure: AMPUTATION  RIGHT 5TH RAY, INCISION AND               DRAINAGE;  Surgeon: Edrick Kins, DPM;  Location: [facility]               ORS;  Service: Podiatry;  Laterality: Right; 2/83/1517: APPLICATION OF WOUND VAC; Right     Comment:  Procedure: APPLICATION OF WOUND VAC;  Surgeon: Edrick Kins, DPM;  Location: [facility] ORS;  Service: Podiatry;                Laterality: Right; 06/27/2020: BONE BIOPSY; Left     Comment:  Procedure: BONE BIOPSY;  Surgeon: Caroline More, DPM;                Location: [facility] OR[facility];  Service: Podiatry;  Laterality:               Left; 06/27/2020: INCISION AND DRAINAGE; Left     Comment:  Procedure: INCISION AND DRAINAGE;  Surgeon: Caroline More, DPM;  Location: [facility]  ORS;  Service: Podiatry;                Laterality: Left; 04/21/2021: IRRIGATION AND DEBRIDEMENT FOOT; Right     Comment:  Procedure: IRRIGATION AND DEBRIDEMENT FOOT;  Surgeon:               Edrick Kins, DPM;  Location: [facility] OR[facility];  Service:               Podiatry;  Laterality: Right; 06/30/2020: LOWER EXTREMITY ANGIOGRAPHY; Left     Comment:  Procedure: Lower Extremity Angiography;  Surgeon: Algernon Huxley, MD;  Location: [place] [facility];  Service:               Cardiovascular;  Laterality: Left; 04/14/2021: LOWER EXTREMITY ANGIOGRAPHY; Right     Comment:  Procedure: Lower Extremity Angiography;  Surgeon: Algernon Huxley, MD;  Location: [place] [facility];  Service:  Cardiovascular;  Laterality: Right; No date: NO PAST SURGERIES 04/19/2021: WOUND DEBRIDEMENT; Right     Comment:  Procedure: DEBRIDEMENT WOUND;  Surgeon: Edrick Kins,               DPM;  Location: [facility] ORS;  Service: Podiatry;                Laterality: Right;  BMI    Body Mass Index: 28.31 kg/m      Reproductive/Obstetrics negative OB ROS                             Anesthesia Physical Anesthesia Plan  ASA: 4 and emergent  Anesthesia Plan: General   Post-op Pain Management: Ofirmev IV (intra-op)* and Regional block*   Induction: Intravenous  PONV Risk Score and Plan: 2 and Ondansetron and Dexamethasone  Airway Management Planned: Oral ETT  Additional Equipment:   Intra-op Plan:   Post-operative Plan: Extubation in OR  Informed Consent: I have reviewed the patients History and Physical, chart, labs and discussed the procedure including the risks, benefits and alternatives for the proposed anesthesia with the patient or authorized representative who has indicated his/her understanding and acceptance.     Dental Advisory Given  Plan Discussed with: Anesthesiologist, CRNA and Surgeon  Anesthesia Plan Comments:          Anesthesia Quick Evaluation

## 2022-11-22 NOTE — Plan of Care (Signed)

## 2022-11-22 NOTE — Progress Notes (Signed)
Operative #0.  Doing well.  Status post guillotine amputation BKA, wound VAC in place.  No complications.  Discussed with Dr. Lucky Cowboy from vascular and he will take over for further debridements  and closure of BKA stump

## 2022-11-22 NOTE — Anesthesia Postprocedure Evaluation (Signed)
Anesthesia Post Note  Patient: PHARRELL LEDFORD  Procedure(s) Performed: IRRIGATION AND DEBRIDEMENT EXTREMITY, below knee amputation right leg (Right: Leg Lower)  Patient location during evaluation: PACU Anesthesia Type: General Level of consciousness: awake and alert Pain management: pain level controlled Vital Signs Assessment: post-procedure vital signs reviewed and stable Respiratory status: spontaneous breathing, nonlabored ventilation and respiratory function stable Cardiovascular status: blood pressure returned to baseline and stable Postop Assessment: no apparent nausea or vomiting Anesthetic complications: no   No notable events documented.   Last Vitals:  Vitals:   11/22/22 0408 11/22/22 0433  BP: 119/76 121/77  Pulse: 86 81  Resp: (!) 21 16  Temp:  36.5 C  SpO2: 100% 100%    Last Pain:  Vitals:   11/22/22 0433  TempSrc: Oral  PainSc:                  [place] Ouch

## 2022-11-22 NOTE — Consult Note (Signed)
[facility] Consult Note  MRN : [id number]  [name] is a 56 y.o. (1966/11/24) male who presents with chief complaint of  Chief Complaint  Patient presents with   Weakness  .   Consulting Physician:Dr. Caroleen Hamman MD.  Reason for consult:Diabetic foot with Necrotizing soft tissue infection of the right leg. Now S/P right BKA History of Present Illness:   [name] is a 56 year-old male with history of non-insulin-dependent diabetes mellitus, hypertension, hyperlipidemia, who presents to the emergency department for chief concerns of weakness and loss of appetite for 4 days.   Of note he had a right fifth toe amputation due to diabetes.  He endorses odor at the site.   Physical exam shows patient now status post guillotine below the knee amputation of right lower extremity.  Wound VAC in place draining appropriately. Patient reports pain. No other concerns at this time.   Current Facility-Administered Medications  Medication Dose Route Frequency Provider Last Rate Last Admin   acetaminophen (TYLENOL) tablet 650 mg  650 mg Oral Q6H PRN Sreenath, Sudheer B, MD       ceFEPIme (MAXIPIME) 2 g in sodium chloride 0.9 % 100 mL IVPB  2 g Intravenous Q8H Sreenath, Sudheer B, MD       heparin injection 5,000 Units  5,000 Units Subcutaneous Q8H Sreenath, Sudheer B, MD       hydrALAZINE (APRESOLINE) injection 5 mg  5 mg Intravenous Q6H PRN Pabon, Diego F, MD       insulin aspart (novoLOG) injection 0-5 Units  0-5 Units Subcutaneous QHS Caroleen Hamman F, MD   2 Units at 11/21/22 2252   insulin aspart (novoLOG) injection 0-9 Units  0-9 Units Subcutaneous TID WC Pabon, Diego F, MD   7 Units at 11/22/22 1129   lactated ringers infusion   Intravenous Continuous Pabon, Diego F, MD 150 mL/hr at 11/22/22 1042 New Bag at 11/22/22 1042   metroNIDAZOLE (FLAGYL) IVPB 500 mg  500 mg Intravenous Q12H Ralene Muskrat B, MD 100 mL/hr at 11/22/22 1132 500 mg at 11/22/22  1132   morphine (PF) 2 MG/ML injection 2 mg  2 mg Intravenous Q3H PRN Ralene Muskrat B, MD   2 mg at 11/22/22 1447   ondansetron (ZOFRAN) tablet 4 mg  4 mg Oral Q6H PRN Pabon, Diego F, MD       Or   ondansetron (ZOFRAN) injection 4 mg  4 mg Intravenous Q6H PRN Pabon, Diego F, MD       Oral care mouth rinse  15 mL Mouth Rinse PRN Sreenath, Sudheer B, MD       oxyCODONE (Oxy IR/ROXICODONE) immediate release tablet 5-10 mg  5-10 mg Oral Q4H PRN Ralene Muskrat B, MD   10 mg at 11/22/22 1447   potassium chloride 10 mEq in 100 mL IVPB  10 mEq Intravenous Once Pabon, Diego F, MD       rosuvastatin (CRESTOR) tablet 40 mg  40 mg Oral QHS Pabon, Diego F, MD       senna-docusate (Senokot-S) tablet 1 tablet  1 tablet Oral QHS PRN Pabon, Diego F, MD       sodium chloride 0.9 % bolus 1,000 mL  1,000 mL Intravenous Once Pabon, Diego F, MD       vancomycin (VANCOREADY) IVPB 1500 mg/300 mL  1,500 mg Intravenous Q24H Sidney Ace, MD        Past Medical History:  Diagnosis Date   Diabetes mellitus  without complication ([place])    Hyperlipidemia    Hypertension     Past Surgical History:  Procedure Laterality Date   AMPUTATION Right 04/16/2021   Procedure: AMPUTATION  RIGHT 5TH RAY, INCISION AND DRAINAGE;  Surgeon: Edrick Kins, DPM;  Location: [facility];  Service: Podiatry;  Laterality: Right;   APPLICATION OF WOUND VAC Right 04/21/2021   Procedure: APPLICATION OF WOUND VAC;  Surgeon: Edrick Kins, DPM;  Location: [facility];  Service: Podiatry;  Laterality: Right;   BONE BIOPSY Left 06/27/2020   Procedure: BONE BIOPSY;  Surgeon: Caroline More, DPM;  Location: [facility];  Service: Podiatry;  Laterality: Left;   COLONOSCOPY WITH PROPOFOL N/A 06/22/2022   Procedure: COLONOSCOPY WITH PROPOFOL;  Surgeon: [name], MD;  Location: [facility];  Service: Gastroenterology;  Laterality: N/A;   INCISION AND DRAINAGE Left 06/27/2020   Procedure: INCISION AND DRAINAGE;  Surgeon: Caroline More, DPM;   Location: [facility];  Service: Podiatry;  Laterality: Left;   IRRIGATION AND DEBRIDEMENT FOOT Right 04/21/2021   Procedure: IRRIGATION AND DEBRIDEMENT FOOT;  Surgeon: Edrick Kins, DPM;  Location: [facility];  Service: Podiatry;  Laterality: Right;   LOWER EXTREMITY ANGIOGRAPHY Left 06/30/2020   Procedure: Lower Extremity Angiography;  Surgeon: Algernon Huxley, MD;  Location: [facility] [facility] [facility];  Service: Cardiovascular;  Laterality: Left;   LOWER EXTREMITY ANGIOGRAPHY Right 04/14/2021   Procedure: Lower Extremity Angiography;  Surgeon: Algernon Huxley, MD;  Location: [place] [facility];  Service: Cardiovascular;  Laterality: Right;   NO PAST SURGERIES     WOUND DEBRIDEMENT Right 04/19/2021   Procedure: DEBRIDEMENT WOUND;  Surgeon: Edrick Kins, DPM;  Location: [facility];  Service: Podiatry;  Laterality: Right;    Social History Social History   Tobacco Use   Smoking status: Never   Smokeless tobacco: Never  Vaping Use   Vaping Use: Never used  Substance Use Topics   Alcohol use: Yes    Alcohol/week: 6.0 - 12.0 standard drinks of alcohol    Types: 6 - 12 Cans of beer per week    Comment: Occasional   Drug use: No    Family History Family History  Problem Relation Age of Onset   Heart disease Father    Heart attack Father    Healthy Mother    Prostate cancer Neg Hx    Kidney cancer Neg Hx    Bladder Cancer Neg Hx     No Known Allergies   REVIEW OF SYSTEMS (Negative unless checked)  Constitutional: '[]'$ Weight loss  '[]'$ Fever  '[]'$ Chills Cardiac: '[]'$ Chest pain   '[]'$ Chest pressure   '[]'$ Palpitations   '[]'$ Shortness of breath when laying flat   '[]'$ Shortness of breath at rest   '[]'$ Shortness of breath with exertion. Vascular:  '[x]'$ Pain in legs with walking   '[]'$ Pain in legs at rest   '[]'$ Pain in legs when laying flat   '[]'$ Claudication   '[]'$ Pain in feet when walking  '[]'$ Pain in feet at rest  '[]'$ Pain in feet when laying flat   '[]'$ History of DVT   '[]'$ Phlebitis   '[x]'$ Swelling in legs   '[]'$ Varicose veins    '[]'$ Non-healing ulcers Pulmonary:   '[]'$ Uses home oxygen   '[]'$ Productive cough   '[]'$ Hemoptysis   '[]'$ Wheeze  '[]'$ COPD   '[]'$ Asthma Neurologic:  '[]'$ Dizziness  '[]'$ Blackouts   '[]'$ Seizures   '[]'$ History of stroke   '[]'$ History of TIA  '[]'$ Aphasia   '[]'$ Temporary blindness   '[]'$ Dysphagia   '[]'$ Weakness or numbness in arms   '[]'$ Weakness or numbness in  legs Musculoskeletal:  '[]'$ Arthritis   '[]'$ Joint swelling   '[]'$ Joint pain   '[]'$ Low back pain Hematologic:  '[]'$ Easy bruising  '[]'$ Easy bleeding   '[]'$ Hypercoagulable state   '[]'$ Anemic  '[]'$ Hepatitis Gastrointestinal:  '[]'$ Blood in stool   '[]'$ Vomiting blood  '[]'$ Gastroesophageal reflux/heartburn   '[]'$ Difficulty swallowing. Genitourinary:  '[]'$ Chronic kidney disease   '[]'$ Difficult urination  '[]'$ Frequent urination  '[]'$ Burning with urination   '[]'$ Blood in urine Skin:  '[]'$ Rashes   '[]'$ Ulcers   '[]'$ Wounds Psychological:  '[]'$ History of anxiety   '[]'$  History of major depression.  Physical Examination  Vitals:   11/22/22 0740 11/22/22 1100 11/22/22 1122 11/22/22 1601  BP: 137/74  105/67 124/71  Pulse: 81  77 78  Resp: 14   16  Temp: (!) 96.8 F (36 C) (!) 96.2 F (35.7 C)  (!) 97.4 F (36.3 C)  TempSrc: Axillary Axillary    SpO2: 100%  100% 100%  Weight:      Height:       Body mass index is 23.14 kg/m. Gen:  WD/WN, NAD Head: [place]/AT, No temporalis wasting. Prominent temp pulse not noted. Ear/Nose/Throat: Hearing grossly intact, nares w/o erythema or drainage, oropharynx w/o Erythema/Exudate Eyes: Sclera non-icteric, conjunctiva clear Neck: Trachea midline.  No JVD.  Pulmonary:  Good air movement, respirations not labored, equal bilaterally.  Cardiac: RRR, normal S1, S2. Vascular: Post guillotine right below the knee amputation.  This wound is covered with a wound VAC prior to returning to the operating room to proceed with right above-the-knee amputation.  If lower extremity is completely intact.  No sores or infection of the note.  This is are palpable. Vessel Right Left  Radial Palpable Palpable  Ulnar     Brachial    Carotid    Aorta    Femoral  Palpable  Popliteal N/A Palpable  PT N/A Palpable  DP N/A Palpable   Gastrointestinal: soft, non-tender/non-distended. No guarding/reflex.  Musculoskeletal: M/S 5/5 throughout.  Extremities with ischemic changes.  No deformity or atrophy. No edema. Neurologic: Sensation grossly intact in extremities.  Symmetrical.  Speech is fluent. Motor exam as listed above. Psychiatric: Judgment intact, Mood & affect appropriate for pt's clinical situation. Dermatologic: No rashes or ulcers noted.  No cellulitis or open wounds. Lymph : No Cervical, Axillary, or Inguinal lymphadenopathy.    CBC Lab Results  Component Value Date   WBC 18.7 (H) 11/22/2022   HGB 8.6 (L) 11/22/2022   HCT 26.7 (L) 11/22/2022   MCV 89.3 11/22/2022   PLT 317 11/22/2022    BMET    Component Value Date/Time   NA 137 11/22/2022 0459   NA 141 09/06/2022 1417   K 2.8 (L) 11/22/2022 0459   CL 103 11/22/2022 0459   CO2 14 (L) 11/22/2022 0459   GLUCOSE 205 (H) 11/22/2022 0459   BUN 45 (H) 11/22/2022 0459   BUN 25 (H) 09/06/2022 1417   CREATININE 1.39 (H) 11/22/2022 0459   CREATININE 0.79 11/09/2021 0902   CALCIUM 9.0 11/22/2022 0459   GFRNONAA 60 (L) 11/22/2022 0459   GFRNONAA 83 02/09/2021 1135   GFRAA 96 02/09/2021 1135   Estimated Creatinine Clearance: 64 mL/min (A) (by C-G formula based on SCr of 1.39 mg/dL (H)).  COAG No results found for: "INR", "PROTIME"  Radiology CT EXTREMITY LOWER RIGHT W CONTRAST  Result Date: 11/22/2022 CLINICAL DATA:  Osteomyelitis suspected, foot, xray done gas gangrene at knee on xray, please scan from foot to pelvis. Thank you. EXAM: CT OF THE LOWER RIGHT EXTREMITY WITH CONTRAST TECHNIQUE: Multidetector  CT imaging of the lower right extremity was performed according to the standard protocol following intravenous contrast administration. RADIATION DOSE REDUCTION: This exam was performed according to the departmental dose-optimization  program which includes automated exposure control, adjustment of the mA and/or kV according to patient size and/or use of iterative reconstruction technique. CONTRAST:  165m OMNIPAQUE IOHEXOL 300 MG/ML  SOLN COMPARISON:  Tibia/fibula and foot radiographs earlier today. FINDINGS: Imaging obtained from the hip to the foot. Bones/Joint/Cartilage Patchy areas of decreased density and erosions throughout the tarsal bones. There are erosive changes in the cuboid, all cuneiform so and to a lesser extent navicular. There is intra osseous air within the navicular. Patchy air within the intertarsal joints and subtalar joint the foot. Previous resection of the fifth ray. There is mild smooth periosteal thickening about the distal tibia and fibula.Well corticated defect of the lateral femoral condyle at the knee joint appears chronic. Ligaments Suboptimally assessed by CT. Muscles and Tendons Diffuse intramuscular air and fluid collections throughout the foot. The largest air-fluid collection is in the dorsum of the foot, 6.3 x 2.6 cm series 2, image 653. Intramuscular gas tracks in the anterior compartment of the lower leg to the level of the proximal tibia/fibular articulation. No intramuscular gas proximal to the knee. Soft tissues Circumferential subcutaneous edema about the distal aspect of the lower leg. There is subcutaneous edema and skin thickening throughout the foot. Patchy soft tissue gas throughout the foot with a large plantar soft tissue defect extending to the cuboid. IMPRESSION: 1. Plantar soft tissue defect in the foot extending to the cuboid with osteomyelitis. Osteomyelitis involves the tarsal bones, cuboid, all cuneiforms and navicular. There is also intraosseous air in the navicular. 2. Extensive soft tissue and intramuscular gas extending from the level of the plantar wound throughout the foot. Heterogeneous air-fluid collections throughout the foot with dominant collection dorsally. Intra-articular air  throughout the midfoot and subtalar joint. Findings consistent with gas gangrene and possible necrotizing soft tissue infection. 3. Soft tissue and intramuscular gas tracks proximally in the anterior compartment to the level of the proximal tibia/fibular articulation. Smooth periosteal thickening of the mid and distal tibia and fibula have a chronic appearance. These results were called by telephone at the time of interpretation on 11/22/2022 at 12:50 am to provider NP KJerrye Bushy, who verbally acknowledged these results. Electronically Signed   By: MKeith RakeM.D.   On: 11/22/2022 00:51   DG Tibia/Fibula Right  Result Date: 11/21/2022 CLINICAL DATA:  Evaluate gas gangrene in right lower extremity, initial encounter EXAM: RIGHT TIBIA AND FIBULA - 2 VIEW COMPARISON:  Foot films from earlier in the same day. FINDINGS: There is extensive air identified in the subcutaneous tissues along the lateral aspect of the lower leg extending from the foot. These extend to the level of the knee joint. No bony erosive changes are identified. No definitive erosive changes are seen in the tibia and fibula. Stable changes in the foot are noted similar to that seen earlier on the same day. IMPRESSION: Progression of subcutaneous air to the level of the knee joint. No abnormality involving the tibia and fibula is seen. Electronically Signed   By: MInez CatalinaM.D.   On: 11/21/2022 21:23   DG Foot Complete Right  Result Date: 11/21/2022 CLINICAL DATA:  Osteomyelitis EXAM: RIGHT FOOT COMPLETE - 3+ VIEW COMPARISON:  None Available. FINDINGS: Right fifth ray amputation has been performed. There is extensive soft tissue swelling of the right foot and extensive  subcutaneous gas noted within the dorsum of the right forefoot and midfoot. There is, additionally, subcutaneous gas and erosive changes involving the third metatarsophalangeal joint in keeping with osteomyelitis/septic arthritis in this location. There is periosteal  reaction involving the base of the fourth metatarsal as well as periarticular lucencies involving the cuboid and fourth metatarsal suspicious for changes of osteomyelitis in these locations. Advanced vascular calcifications noted. No fracture. IMPRESSION: 1. Extensive soft tissue swelling and subcutaneous gas within the dorsum of the right forefoot and midfoot. Correlation for aggressive soft tissue infection is recommended. 2. Findings in keeping with osteomyelitis/septic arthritis involving the third metatarsophalangeal joint. 3. Suspected periarticular osteomyelitis involving the base of the fourth metatarsal and cuboid. 4. Right fifth ray amputation. Electronically Signed   By: Fidela Salisbury M.D.   On: 11/21/2022 20:05      Assessment/Plan 1. Gas Gangrene of the right lower extremity:   Patient is now status post right guillotine below the knee amputation.  CT of the right lower extremity reveals plantar soft tissue defect in the foot that extends to the cuboid with osteomyelitis.  Also intraosseous air is in the foot.  Extensive soft tissue and intramuscular gas extends from the level of the plantar foot wound through the foot.  Soft tissue and intramuscular gas tracks approximately in the anterior compartment to the level of the proximal tibia/fibular articulation.  Findings are consistent with gas gangrene green and possible necrotizing soft tissue infection.  The patient has been placed on the schedule for an above-the-knee amputation on the right lower extremity.  I had a long discussion with the patient this morning concerning the findings of the CTA.  I explained to the patient that from a vascular standpoint he is necrotizing tissue damage and gas gangrene extending into his knee.  For he will need an above-the-knee amputation to remove any infection and dead tissue he came to the emergency room with.  I discussed in detail with him the procedure, benefits, risks, and complications.  He asked  questions and they were all answered.  He verbalizes his understanding.  Wishes to proceed with an above the knee amputation of his right lower extremity.  Will schedule him for this surgery on Friday, 11/24/2022.   Plan of care discussed with Dr Leotis Pain MD and he is in agreement with plan noted above.   Family Communication:  Total Time:75 I spent 75 minutes in this encounter including personally reviewing extensive medical records, personally reviewing imaging studies and compared to prior scans, counseling the patient, placing orders, coordinating care and performing appropriate documentation  Thank you for allowing [place] to participate in the care of this patient.   Drema Pry, NP [facility] [phone] (Office Phone) [phone] (Office Fax) [phone] (Pager)  11/22/2022 4:26 PM  Staff may message me via secure chat in [place]  but this may not receive immediate response,  please page for urgent matters!  Dictation software was used to generate the above note. Typos may occur and escape review, as with typed/written notes. Any error is purely unintentional.  Please contact me directly for clarity if needed.

## 2022-11-22 NOTE — Progress Notes (Signed)
  Transition of Care [facility]) Screening Note   Patient Details  Name: [name] Date of Birth: [birth year]   Transition of Care [facility]) CM/SW Contact:    Tiburcio Bash, LCSW Phone Number: 11/22/2022, 10:17 AM    Transition of Care Department [facility]) has reviewed patient and no TOC needs have been identified at this time. We will continue to monitor patient advancement through interdisciplinary progression rounds. If new patient transition needs arise, please place a TOC consult.  Kelby Fam, [place], MSW, [place]

## 2022-11-22 NOTE — Inpatient Diabetes Management (Signed)
Inpatient Diabetes Program Recommendations  AACE/ADA: New Consensus Statement on Inpatient Glycemic Control (2015)  Target Ranges:  Prepandial:   less than 140 mg/dL      Peak postprandial:   less than 180 mg/dL (1-2 hours)      Critically ill patients:  140 - 180 mg/dL   Lab Results  Component Value Date   GLUCAP 226 (H) 11/22/2022   HGBA1C 7.1 (A) 09/05/2022    Review of Glycemic Control  Latest Reference Range & Units 11/22/22 03:13 11/22/22 06:03 11/22/22 07:42  Glucose-Capillary 70 - 99 mg/dL 172 (H) 186 (H) 226 (H)  (H): Data is abnormally high Diabetes history: Type 2 Dm Outpatient Diabetes medications: Jardiance 25 mg QD, Glipizide 5 mg qd, Rybelsus 14 mg QD Current orders for Inpatient glycemic control: Novolog 0-9 units TID & HS  Inpatient Diabetes Program Recommendations:    Consider adding Semglee 10 units QHS.   Thanks, Bronson Curb, MSN, RNC-OB Diabetes Coordinator [phone] (8a-5p)

## 2022-11-22 NOTE — Anesthesia Procedure Notes (Signed)
Procedure Name: Intubation Date/Time: 11/22/2022 1:17 AM  Performed by: Aline Brochure, CRNAPre-anesthesia Checklist: Patient identified, Patient being monitored, Timeout performed, Emergency Drugs available and Suction available Patient Re-evaluated:Patient Re-evaluated prior to induction Oxygen Delivery Method: Circle system utilized Preoxygenation: Pre-oxygenation with 100% oxygen Induction Type: IV induction Ventilation: Mask ventilation without difficulty Laryngoscope Size: McGraph and 4 Grade View: Grade I Tube type: Oral Tube size: 7.5 mm Number of attempts: 1 Airway Equipment and Method: Stylet and Video-laryngoscopy Placement Confirmation: ETT inserted through vocal cords under direct vision, positive ETCO2 and breath sounds checked- equal and bilateral Secured at: 21 cm Tube secured with: Tape Dental Injury: Teeth and Oropharynx as per pre-operative assessment

## 2022-11-22 NOTE — Transfer of Care (Signed)
Immediate Anesthesia Transfer of Care Note  Patient: [name]  Procedure(s) Performed: IRRIGATION AND DEBRIDEMENT EXTREMITY, below knee amputation right leg (Right: Leg Lower)  Patient Location: PACU  Anesthesia Type:General  Level of Consciousness: awake, alert , and oriented  Airway & Oxygen Therapy: Patient Spontanous Breathing  Post-op Assessment: Report given to RN and Post -op Vital signs reviewed and stable  Post vital signs: Reviewed and stable  Last Vitals:  Vitals Value Taken Time  BP 128/74 11/22/22 0308  Temp    Pulse 87 11/22/22 0309  Resp 11 11/22/22 0309  SpO2 100 % 11/22/22 0309  Vitals shown include unvalidated device data.  Last Pain:  Vitals:   11/21/22 2242  TempSrc: Oral  PainSc:          Complications: No notable events documented.

## 2022-11-22 NOTE — Progress Notes (Signed)
PROGRESS NOTE    [name]  OFB:[id number] DOB: 1966-10-14 DOA: 11/21/2022 PCP: [name], MD    Brief Narrative:  56 year-old male with history of non-insulin-dependent diabetes mellitus, hypertension, hyperlipidemia, who presents to the emergency department for chief concerns of weakness and loss of appetite for 4 days.   Of note he had a right fifth toe amputation due to diabetes.  He endorses odor at the site.   Initial vitals in the ED showed temperature of 98.5, respiration rate of 18, heart rate of 99, blood pressure 125/81, SpO2 of 98% on room air.  Physical exam and imaging findings concerning for lower extremity necrotizing fasciitis.  General surgery, podiatry consulted.  General surgery took patient to the OR early a.m. on 12/13.  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  Wound VAC in place.   Assessment & Plan:   Principal Problem:   Gas gangrene ([place]) Active Problems:   Hypertension   Hyperglycemia due to type 2 diabetes mellitus ([place])   HLD (hyperlipidemia)   Diabetic ulcer of foot with fat layer exposed ([place])   PAD (peripheral artery disease) ([facility])   AKI (acute kidney injury) ([place])   B12 deficiency   Soft tissue infection of foot   Elevated troponin   Hypokalemia  * Gas gangrene of lower extremity ([facility]) Right lower extremity gas gangrene Patient now status post guillotine amputation right lower extremity.  Wound VAC in place Plan: Continue broad-spectrum antibiotics of metronidazole, vancomycin, cefepime.  Vascular surgery follow-up for surgical closure.  Continue wound VAC.  Okay for diet.  Multimodal pain control.   Hypokalemia Monitor and replace as necessary Monitor serum magnesium as well  Elevated troponin Suspect supply demand ischemia in the setting of AKI.  Low clinical suspicion for ACS.  Will continue telemetry monitoring for now.  If stable without chest pain will discontinue within 24  hours.   Soft tissue infection of foot See above for plan.  Patient on broad-spectrum IV antibiotics.  Multimodal pain control.  Surgical follow-up.  AKI (acute kidney injury) ([facility]) Mild.  Appears recovered.  Monitor kidney function and administer IV fluids as necessary.  HLD (hyperlipidemia) - Resumed home rosuvastatin 40 mg nightly PTA Crestor  Hypertension Continue home amlodipine 5 mg daily.  IV hydralazine as needed.  Home valsartan/hydrochlorothiazide on hold for now.  Can likely restart in 24 hours.  DVT prophylaxis: SQ heparin Code Status: Full Family Communication: None today Disposition Plan: Status is: Inpatient Remains inpatient appropriate because: Gas gangrene right lower extremity status post guillotine amputation   Level of care: Telemetry Medical  Consultants:  General surgery Vascular surgery  Procedures:  Right lower extremity guillotine amputation.  Wound VAC  Antimicrobials: Vancomycin Cefepime Flagyl   Subjective: Seen and examined.  Resting comfortably in bed.  Pain mild to moderate.  Objective: Vitals:   11/22/22 0447 11/22/22 0740 11/22/22 1100 11/22/22 1122  BP:  137/74  105/67  Pulse:  81  77  Resp:  14    Temp:  (!) 96.8 F (36 C) (!) 96.2 F (35.7 C)   TempSrc:  Axillary Axillary   SpO2:  100%  100%  Weight: 75.3 kg     Height: '5\' 11"'$  (1.803 m)       Intake/Output Summary (Last 24 hours) at 11/22/2022 1347 Last data filed at 11/22/2022 1044 Gross per 24 hour  Intake 2001.5 ml  Output 2425 ml  Net -423.5 ml   [employer]  11/21/22 1743 11/22/22 0447  Weight: 85.7 kg 75.3 kg    Examination:  General exam: Appears calm and comfortable  Respiratory system: Clear to auscultation. Respiratory effort normal. Cardiovascular system: S1-S2, RRR, no murmurs, no pedal edema Gastrointestinal system: Soft, NT/ND, normal bowel sounds Central nervous system: Alert and oriented. No focal neurological deficits. Extremities: Right  lower extremity status post BKA.  Wound VAC in place Skin: No rashes, lesions or ulcers Psychiatry: Judgement and insight appear normal. Mood & affect appropriate.     Data Reviewed: I have personally reviewed following labs and imaging studies  CBC: Recent Labs  Lab 11/21/22 1753 11/22/22 0459  WBC 16.9* 18.7*  HGB 10.4* 8.6*  HCT 33.2* 26.7*  MCV 89.2 89.3  PLT 416* 998   Basic Metabolic Panel: Recent Labs  Lab 11/21/22 1753 11/22/22 0459  NA 137 137  K 2.6* 2.8*  CL 95* 103  CO2 15* 14*  GLUCOSE 215* 205*  BUN 52* 45*  CREATININE 1.69* 1.39*  CALCIUM 10.2 9.0  MG  --  2.2  PHOS  --  3.6   GFR: Estimated Creatinine Clearance: 64 mL/min (A) (by C-G formula based on SCr of 1.39 mg/dL (H)). Liver Function Tests: No results for input(s): "AST", "ALT", "ALKPHOS", "BILITOT", "PROT", "ALBUMIN" in the last 168 hours. No results for input(s): "LIPASE", "AMYLASE" in the last 168 hours. No results for input(s): "AMMONIA" in the last 168 hours. Coagulation Profile: No results for input(s): "INR", "PROTIME" in the last 168 hours. Cardiac Enzymes: No results for input(s): "CKTOTAL", "CKMB", "CKMBINDEX", "TROPONINI" in the last 168 hours. BNP (last 3 results) No results for input(s): "PROBNP" in the last 8760 hours. HbA1C: No results for input(s): "HGBA1C" in the last 72 hours. CBG: Recent Labs  Lab 11/21/22 2247 11/22/22 0313 11/22/22 0603 11/22/22 0742 11/22/22 1124  GLUCAP 222* 172* 186* 226* 300*   Lipid Profile: No results for input(s): "CHOL", "HDL", "LDLCALC", "TRIG", "CHOLHDL", "LDLDIRECT" in the last 72 hours. Thyroid Function Tests: No results for input(s): "TSH", "T4TOTAL", "FREET4", "T3FREE", "THYROIDAB" in the last 72 hours. Anemia Panel: No results for input(s): "VITAMINB12", "FOLATE", "FERRITIN", "TIBC", "IRON", "RETICCTPCT" in the last 72 hours. Sepsis Labs: Recent Labs  Lab 11/21/22 1753  LATICACIDVEN 1.9    Recent Results (from the past 240  hour(s))  Culture, blood (Routine X 2) w Reflex to ID Panel     Status: None (Preliminary result)   Collection Time: 11/21/22  9:26 PM   Specimen: BLOOD  Result Value Ref Range Status   Specimen Description BLOOD LEFT HAND  Final   Special Requests   Final    BOTTLES DRAWN AEROBIC AND ANAEROBIC Blood Culture results may not be optimal due to an inadequate volume of blood received in culture bottles   Culture   Final    NO GROWTH < 12 HOURS Performed at [facility], [address] [place], [place] [address]    Report Status PENDING  Incomplete  Culture, blood (Routine X 2) w Reflex to ID Panel     Status: None (Preliminary result)   Collection Time: 11/21/22  9:47 PM   Specimen: BLOOD  Result Value Ref Range Status   Specimen Description BLOOD RIGHT ASSIST CONTROL  Final   Special Requests   Final    BOTTLES DRAWN AEROBIC AND ANAEROBIC Blood Culture adequate volume   Culture   Final    NO GROWTH < 12 HOURS Performed at [facility], [address], [place], [place] [address]  Report Status PENDING  Incomplete         Radiology Studies: CT EXTREMITY LOWER RIGHT W CONTRAST  Result Date: 11/22/2022 CLINICAL DATA:  Osteomyelitis suspected, foot, xray done gas gangrene at knee on xray, please scan from foot to pelvis. Thank you. EXAM: CT OF THE LOWER RIGHT EXTREMITY WITH CONTRAST TECHNIQUE: Multidetector CT imaging of the lower right extremity was performed according to the standard protocol following intravenous contrast administration. RADIATION DOSE REDUCTION: This exam was performed according to the departmental dose-optimization program which includes automated exposure control, adjustment of the mA and/or kV according to patient size and/or use of iterative reconstruction technique. CONTRAST:  120m OMNIPAQUE IOHEXOL 300 MG/ML  SOLN COMPARISON:  Tibia/fibula and foot radiographs earlier today. FINDINGS: Imaging obtained from the hip to the foot.  Bones/Joint/Cartilage Patchy areas of decreased density and erosions throughout the tarsal bones. There are erosive changes in the cuboid, all cuneiform so and to a lesser extent navicular. There is intra osseous air within the navicular. Patchy air within the intertarsal joints and subtalar joint the foot. Previous resection of the fifth ray. There is mild smooth periosteal thickening about the distal tibia and fibula.Well corticated defect of the lateral femoral condyle at the knee joint appears chronic. Ligaments Suboptimally assessed by CT. Muscles and Tendons Diffuse intramuscular air and fluid collections throughout the foot. The largest air-fluid collection is in the dorsum of the foot, 6.3 x 2.6 cm series 2, image 653. Intramuscular gas tracks in the anterior compartment of the lower leg to the level of the proximal tibia/fibular articulation. No intramuscular gas proximal to the knee. Soft tissues Circumferential subcutaneous edema about the distal aspect of the lower leg. There is subcutaneous edema and skin thickening throughout the foot. Patchy soft tissue gas throughout the foot with a large plantar soft tissue defect extending to the cuboid. IMPRESSION: 1. Plantar soft tissue defect in the foot extending to the cuboid with osteomyelitis. Osteomyelitis involves the tarsal bones, cuboid, all cuneiforms and navicular. There is also intraosseous air in the navicular. 2. Extensive soft tissue and intramuscular gas extending from the level of the plantar wound throughout the foot. Heterogeneous air-fluid collections throughout the foot with dominant collection dorsally. Intra-articular air throughout the midfoot and subtalar joint. Findings consistent with gas gangrene and possible necrotizing soft tissue infection. 3. Soft tissue and intramuscular gas tracks proximally in the anterior compartment to the level of the proximal tibia/fibular articulation. Smooth periosteal thickening of the mid and distal  tibia and fibula have a chronic appearance. These results were called by telephone at the time of interpretation on 11/22/2022 at 12:50 am to provider NP KJerrye Bushy, who verbally acknowledged these results. Electronically Signed   By: MKeith RakeM.D.   On: 11/22/2022 00:51   DG Tibia/Fibula Right  Result Date: 11/21/2022 CLINICAL DATA:  Evaluate gas gangrene in right lower extremity, initial encounter EXAM: RIGHT TIBIA AND FIBULA - 2 VIEW COMPARISON:  Foot films from earlier in the same day. FINDINGS: There is extensive air identified in the subcutaneous tissues along the lateral aspect of the lower leg extending from the foot. These extend to the level of the knee joint. No bony erosive changes are identified. No definitive erosive changes are seen in the tibia and fibula. Stable changes in the foot are noted similar to that seen earlier on the same day. IMPRESSION: Progression of subcutaneous air to the level of the knee joint. No abnormality involving the tibia and fibula is seen. Electronically  Signed   By: Inez Catalina M.D.   On: 11/21/2022 21:23   DG Foot Complete Right  Result Date: 11/21/2022 CLINICAL DATA:  Osteomyelitis EXAM: RIGHT FOOT COMPLETE - 3+ VIEW COMPARISON:  None Available. FINDINGS: Right fifth ray amputation has been performed. There is extensive soft tissue swelling of the right foot and extensive subcutaneous gas noted within the dorsum of the right forefoot and midfoot. There is, additionally, subcutaneous gas and erosive changes involving the third metatarsophalangeal joint in keeping with osteomyelitis/septic arthritis in this location. There is periosteal reaction involving the base of the fourth metatarsal as well as periarticular lucencies involving the cuboid and fourth metatarsal suspicious for changes of osteomyelitis in these locations. Advanced vascular calcifications noted. No fracture. IMPRESSION: 1. Extensive soft tissue swelling and subcutaneous gas within the  dorsum of the right forefoot and midfoot. Correlation for aggressive soft tissue infection is recommended. 2. Findings in keeping with osteomyelitis/septic arthritis involving the third metatarsophalangeal joint. 3. Suspected periarticular osteomyelitis involving the base of the fourth metatarsal and cuboid. 4. Right fifth ray amputation. Electronically Signed   By: Fidela Salisbury M.D.   On: 11/21/2022 20:05        Scheduled Meds:  fentaNYL       fentaNYL       fentaNYL       insulin aspart  0-5 Units Subcutaneous QHS   insulin aspart  0-9 Units Subcutaneous TID WC   oxyCODONE       rosuvastatin  40 mg Oral QHS   Continuous Infusions:  acetaminophen     ceFEPime (MAXIPIME) IV     lactated ringers 150 mL/hr at 11/22/22 1042   metronidazole 500 mg (11/22/22 1132)   potassium chloride     sodium chloride     vancomycin       LOS: 1 day    Sidney Ace, MD [facility]   If 7PM-7AM, please contact night-coverage  11/22/2022, 1:47 PM

## 2022-11-22 NOTE — Progress Notes (Signed)
Pharmacy Antibiotic Note  [name] is a 56 y.o. male admitted on 11/21/2022 with cellulitis.  Pharmacy has been consulted for cefepime and Vancomycin dosing.  Plan:  Increase cefepime 2 grams IV to every 8 hours  Vancomycin 1500 mg IV x dose given 12/12 '@2335'$ , then Vancomycin 1500 mg q 24 hrs.  Goal AUC 400-550. Expected AUC: 481 Cmax: 37 Cmin 10.1 SCr used: 1.39  Height: '5\' 11"'$  (180.3 cm) Weight: 75.3 kg (165 lb 14.4 oz) IBW/kg (Calculated) : 75.3  Temp (24hrs), Avg:97.3 F (36.3 C), Min:96.2 F (35.7 C), Max:98.5 F (36.9 C)  Recent Labs  Lab 11/21/22 1753 11/22/22 0459  WBC 16.9* 18.7*  CREATININE 1.69* 1.39*  LATICACIDVEN 1.9  --      Estimated Creatinine Clearance: 64 mL/min (A) (by C-G formula based on SCr of 1.39 mg/dL (H)).    No Known Allergies  12/13 Blood cx: collected  Antimicrobials this admission: 12/12 cefepime >>  12/12 Vancomycin  Thank you for allowing pharmacy to be a part of this patient's care.  Ras Kollman Rodriguez-Guzman PharmD, BCPS 11/22/2022 11:34 AM

## 2022-11-22 NOTE — Op Note (Signed)
  PREOPERATIVE DIAGNOSIS:  Diabetic foot with necrotizing soft tissue infection of the right leg Sepsis  POSTOPERATIVE DIAGNOSIS:   Same  OPERATION  Right open below-the-knee amputation. Wound vac placement  SURGEON: Demeco Ducksworth MD  EBL:250cc  ANESTHESIA:  GETA  FINDINGS: Foul smelling necrotizing soft tissue infection with gross pus involving the right ankle and lateral compartment of the right leg. Necrosis extends  above the ankle, pus tracked all the way to the knee on the  lateral compartment   SPECIMENS:  Right foot and leg  to pathology for permanent section.  DESCRIPTION OF OPERATION:  The patient was placed supine. The area was prepped and draped in the usual aseptic fashion.  I started exploring the lateral compartment on lower leg, copious amount of pus was drained. It was cultured. It was obvious that he had necrosis of the boen in the foot with osteo, there was muscle necrosis to right above the ankle.  Given extension of infection It was evidence to me that we was going to require a Guillotine amputation. I made my incision 8 finger breaths  Below the knee distal to the tibial tuberosity  to have plenty of flap and soft tissue for future revisions. The incision was continued through the fascia. The anterior compartment muscles were divided using electrosurgical dissection. The tibia and fibula were cleared. Periosteal elevator was used to clear the periosteum from the tibia. The tibia was transected with a power reciprocating saw. The fibula was transected 1 cm proximal to the tibial transection site using the bone cutters. The amputation was then completed with an amputation knife. The flap was debulked using scissors. The nerve was placed on traction, ligated and divided sharply and allowed to retract. The anterior tibialis and posterior tibialis were ligated with 2-0 silks.  Good hemostasis achieved with cautery. Liters of irrigation used.  Given extend of infection I  did not close the fascia   The wound could not be closed completely secondary to active infection and I decided to place a wound vac to be able to drain the pus. The wound measured 16 x 12 cms. No evidence of leaks within the vac. There were no apparent complications.  I have d/w Dr. Lucky Cowboy vascular and he agreed with me about the need for amputation given the extend of necrosis and infection. Now that source of infection is controlled we will let vascular close the stump and revise it at their discretion.

## 2022-11-23 ENCOUNTER — Encounter: Payer: Self-pay | Admitting: Surgery

## 2022-11-23 LAB — CBC WITH DIFFERENTIAL/PLATELET
Abs Immature Granulocytes: 0.3 10*3/uL — ABNORMAL HIGH (ref 0.00–0.07)
Basophils Absolute: 0 10*3/uL (ref 0.0–0.1)
Basophils Relative: 0 %
Eosinophils Absolute: 0 10*3/uL (ref 0.0–0.5)
Eosinophils Relative: 0 %
HCT: 22.5 % — ABNORMAL LOW (ref 39.0–52.0)
Hemoglobin: 7 g/dL — ABNORMAL LOW (ref 13.0–17.0)
Immature Granulocytes: 2 %
Lymphocytes Relative: 6 %
Lymphs Abs: 0.8 10*3/uL (ref 0.7–4.0)
MCH: 27.7 pg (ref 26.0–34.0)
MCHC: 31.1 g/dL (ref 30.0–36.0)
MCV: 88.9 fL (ref 80.0–100.0)
Monocytes Absolute: 0.6 10*3/uL (ref 0.1–1.0)
Monocytes Relative: 4 %
Neutro Abs: 11.6 10*3/uL — ABNORMAL HIGH (ref 1.7–7.7)
Neutrophils Relative %: 88 %
Platelets: 265 10*3/uL (ref 150–400)
RBC: 2.53 MIL/uL — ABNORMAL LOW (ref 4.22–5.81)
RDW: 13.9 % (ref 11.5–15.5)
WBC: 13.3 10*3/uL — ABNORMAL HIGH (ref 4.0–10.5)
nRBC: 0 % (ref 0.0–0.2)

## 2022-11-23 LAB — GLUCOSE, CAPILLARY
Glucose-Capillary: 221 mg/dL — ABNORMAL HIGH (ref 70–99)
Glucose-Capillary: 221 mg/dL — ABNORMAL HIGH (ref 70–99)
Glucose-Capillary: 226 mg/dL — ABNORMAL HIGH (ref 70–99)
Glucose-Capillary: 211 mg/dL — ABNORMAL HIGH (ref 70–99)

## 2022-11-23 LAB — BASIC METABOLIC PANEL
Anion gap: 10 (ref 5–15)
BUN: 32 mg/dL — ABNORMAL HIGH (ref 6–20)
CO2: 19 mmol/L — ABNORMAL LOW (ref 22–32)
Calcium: 9 mg/dL (ref 8.9–10.3)
Chloride: 111 mmol/L (ref 98–111)
Creatinine, Ser: 1.06 mg/dL (ref 0.61–1.24)
GFR, Estimated: >60 mL/min (ref >60)
Glucose, Bld: 172 mg/dL — ABNORMAL HIGH (ref 70–99)
Potassium: 3.7 mmol/L (ref 3.5–5.1)
Sodium: 140 mmol/L (ref 135–145)

## 2022-11-23 LAB — SEDIMENTATION RATE: Sed Rate: 107 mm/hr — ABNORMAL HIGH (ref 0–20)

## 2022-11-23 LAB — ABO/RH: ABO/RH(D): A POS

## 2022-11-23 MED ORDER — LINEZOLID 600 MG/300ML IV SOLN
600.0000 mg | Freq: Two times a day (BID) | INTRAVENOUS | Status: DC
  Administered 2022-11-23 (×2): 600 mg via INTRAVENOUS
  Filled 2022-11-23 (×3): qty 300

## 2022-11-23 MED ORDER — PREDNISOLONE ACETATE 1 % OP SUSP
1.0000 [drp] | OPHTHALMIC | Status: DC
  Administered 2022-11-23 – 2022-11-25 (×12): 1 [drp] via OPHTHALMIC
  Filled 2022-11-23 (×2): qty 1

## 2022-11-23 NOTE — Plan of Care (Signed)

## 2022-11-23 NOTE — Progress Notes (Signed)
PROGRESS NOTE    [name]  XAJ:[id number] DOB: 06-Oct-1966 DOA: 11/21/2022 PCP: [name], MD    Brief Narrative:  56 year-old male with history of non-insulin-dependent diabetes mellitus, hypertension, hyperlipidemia, who presents to the emergency department for chief concerns of weakness and loss of appetite for 4 days.   Of note he had a right fifth toe amputation due to diabetes.  He endorses odor at the site.   Initial vitals in the ED showed temperature of 98.5, respiration rate of 18, heart rate of 99, blood pressure 125/81, SpO2 of 98% on room air.  Physical exam and imaging findings concerning for lower extremity necrotizing fasciitis.  General surgery, podiatry consulted.  General surgery took patient to the OR early a.m. on 12/13.  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  Wound VAC in place.  12/14: Pain mild to moderate.  Seen in consultation by vascular surgery.  Giving gas tracking up soft tissue vascular recommending AKA.  Patient in agreement.  Plan for procedure 12/15.  Remains on broad-spectrum antibiotics.   Assessment & Plan:   Principal Problem:   Gas gangrene ([place]) Active Problems:   Hypertension   Hyperglycemia due to type 2 diabetes mellitus ([place])   HLD (hyperlipidemia)   Diabetic ulcer of foot with fat layer exposed ([place])   PAD (peripheral artery disease) ([facility])   AKI (acute kidney injury) ([place])   B12 deficiency   Soft tissue infection of foot   Elevated troponin   Hypokalemia  * Gas gangrene of lower extremity ([facility]) Right lower extremity gas gangrene Patient now status post guillotine amputation right lower extremity.  Wound VAC in place Seen in consultation by vascular surgery, plan for AKI Plan: Continue broad-spectrum antibiotics Vancomycin switched out for linezolid per pharmacy recommendations Continue cefepime and Flagyl Continue wound VAC Multimodal pain control Diet as  tolerated N.p.o. after midnight.  Back to the OR tomorrow for AKA with vascular  Hypokalemia Monitor and replace as necessary  Elevated troponin Suspect supply demand ischemia in the setting of AKI.  Low clinical suspicion for ACS.  Discontinue telemetry monitoring   Soft tissue infection of foot See above for plan.  Patient on broad-spectrum IV antibiotics.  Multimodal pain control.  Surgical follow-up.  AKI (acute kidney injury) (H[facility]) Mild.  Appears recovered.  Monitor kidney function and administer IV fluids as necessary.  HLD (hyperlipidemia) PTA Crestor  Hypertension Continue home amlodipine 5 mg daily.  IV hydralazine as needed.  Hold home Zestoretic  DVT prophylaxis: SQ heparin Code Status: Full Family Communication: None today Disposition Plan: Status is: Inpatient Remains inpatient appropriate because: Gas gangrene right lower extremity status post guillotine amputation.  Plan for AKA tomorrow   Level of care: Telemetry Medical  Consultants:  General surgery Vascular surgery  Procedures:  Right lower extremity guillotine amputation.  Wound VAC  Antimicrobials: Cefepime Flagyl Zyvox   Subjective: Seen and examined.  Resting in bed.  Pain mild to moderate.  Objective: Vitals:   11/22/22 1601 11/22/22 1929 11/22/22 2309 11/23/22 0848  BP: 124/71 130/82 115/71 118/78  Pulse: 78 79 75 78  Resp: '16 18 16 18  '$ Temp: (!) 97.4 F (36.3 C) 97.9 F (36.6 C) 97.6 F (36.4 C) 97.8 F (36.6 C)  TempSrc:  Oral  Oral  SpO2: 100% 100% 100% 100%  Weight:      Height:        Intake/Output Summary (Last 24 hours) at 11/23/2022 1236 Last data  filed at 11/23/2022 1100 Gross per 24 hour  Intake --  Output 1550 ml  Net -1550 ml   Filed Weights   11/21/22 1743 11/22/22 0447  Weight: 85.7 kg 75.3 kg    Examination:  General exam: NAD.  Appears fatigued Respiratory system: Lungs clear.  Normal work breathing.  Room air Cardiovascular system: S1-S2, RRR, no  murmurs, no pedal edema Gastrointestinal system: Soft, NT/ND, normal bowel sounds Central nervous system: Alert and oriented. No focal neurological deficits. Extremities: Right lower extremity status post BKA.  Wound VAC in place Skin: No rashes, lesions or ulcers Psychiatry: Judgement and insight appear normal. Mood & affect appropriate.     Data Reviewed: I have personally reviewed following labs and imaging studies  CBC: Recent Labs  Lab 11/21/22 1753 11/22/22 0459 11/23/22 0823  WBC 16.9* 18.7* 13.3*  NEUTROABS  --   --  11.6*  HGB 10.4* 8.6* 7.0*  HCT 33.2* 26.7* 22.5*  MCV 89.2 89.3 88.9  PLT 416* 317 170   Basic Metabolic Panel: Recent Labs  Lab 11/21/22 1753 11/22/22 0459 11/23/22 0823  NA 137 137 140  K 2.6* 2.8* 3.7  CL 95* 103 111  CO2 15* 14* 19*  GLUCOSE 215* 205* 172*  BUN 52* 45* 32*  CREATININE 1.69* 1.39* 1.06  CALCIUM 10.2 9.0 9.0  MG  --  2.2  --   PHOS  --  3.6  --    GFR: Estimated Creatinine Clearance: 83.9 mL/min (by C-G formula based on SCr of 1.06 mg/dL). Liver Function Tests: No results for input(s): "AST", "ALT", "ALKPHOS", "BILITOT", "PROT", "ALBUMIN" in the last 168 hours. No results for input(s): "LIPASE", "AMYLASE" in the last 168 hours. No results for input(s): "AMMONIA" in the last 168 hours. Coagulation Profile: No results for input(s): "INR", "PROTIME" in the last 168 hours. Cardiac Enzymes: No results for input(s): "CKTOTAL", "CKMB", "CKMBINDEX", "TROPONINI" in the last 168 hours. BNP (last 3 results) No results for input(s): "PROBNP" in the last 8760 hours. HbA1C: No results for input(s): "HGBA1C" in the last 72 hours. CBG: Recent Labs  Lab 11/22/22 0603 11/22/22 0742 11/22/22 1124 11/22/22 1602 11/22/22 1939  GLUCAP 186* 226* 300* 191* 166*   Lipid Profile: No results for input(s): "CHOL", "HDL", "LDLCALC", "TRIG", "CHOLHDL", "LDLDIRECT" in the last 72 hours. Thyroid Function Tests: No results for input(s):  "TSH", "T4TOTAL", "FREET4", "T3FREE", "THYROIDAB" in the last 72 hours. Anemia Panel: No results for input(s): "VITAMINB12", "FOLATE", "FERRITIN", "TIBC", "IRON", "RETICCTPCT" in the last 72 hours. Sepsis Labs: Recent Labs  Lab 11/21/22 1753  LATICACIDVEN 1.9    Recent Results (from the past 240 hour(s))  Culture, blood (Routine X 2) w Reflex to ID Panel     Status: None (Preliminary result)   Collection Time: 11/21/22  9:26 PM   Specimen: BLOOD  Result Value Ref Range Status   Specimen Description BLOOD LEFT HAND  Final   Special Requests   Final    BOTTLES DRAWN AEROBIC AND ANAEROBIC Blood Culture results may not be optimal due to an inadequate volume of blood received in culture bottles   Culture   Final    NO GROWTH 2 DAYS Performed at [facility], [address], [place], [place] [address]    Report Status PENDING  Incomplete  Culture, blood (Routine X 2) w Reflex to ID Panel     Status: None (Preliminary result)   Collection Time: 11/21/22  9:47 PM   Specimen: BLOOD  Result Value Ref Range  Status   Specimen Description BLOOD RIGHT ASSIST CONTROL  Final   Special Requests   Final    BOTTLES DRAWN AEROBIC AND ANAEROBIC Blood Culture adequate volume   Culture   Final    NO GROWTH 2 DAYS Performed at [facility], [address], [place], [place] [address]    Report Status PENDING  Incomplete  Aerobic/Anaerobic Culture w Gram Stain (surgical/deep wound)     Status: None (Preliminary result)   Collection Time: 11/22/22  1:38 AM   Specimen: PATH Other; Tissue  Result Value Ref Range Status   Specimen Description   Final    WOUND RIGHT LEG Performed at [facility], [address], [place], [place] [address]    Special Requests   Final    NONE Performed at [facility], [address], [place], [place] [address]    Gram Stain   Final    NO WBC SEEN ABUNDANT GRAM POSITIVE COCCI IN PAIRS RARE GRAM POSITIVE COCCI IN CHAINS     Culture   Final    RARE GRAM NEGATIVE RODS IDENTIFICATION AND SUSCEPTIBILITIES TO FOLLOW ABUNDANT GROUP B STREP(S.AGALACTIAE)ISOLATED TESTING AGAINST S. AGALACTIAE NOT ROUTINELY PERFORMED DUE TO PREDICTABILITY OF AMP/PEN/VAN SUSCEPTIBILITY. Performed at [facility], [place] [address], [place], [place] [address]    Report Status PENDING  Incomplete         Radiology Studies: CT EXTREMITY LOWER RIGHT W CONTRAST  Result Date: 11/22/2022 CLINICAL DATA:  Osteomyelitis suspected, foot, xray done gas gangrene at knee on xray, please scan from foot to pelvis. Thank you. EXAM: CT OF THE LOWER RIGHT EXTREMITY WITH CONTRAST TECHNIQUE: Multidetector CT imaging of the lower right extremity was performed according to the standard protocol following intravenous contrast administration. RADIATION DOSE REDUCTION: This exam was performed according to the departmental dose-optimization program which includes automated exposure control, adjustment of the mA and/or kV according to patient size and/or use of iterative reconstruction technique. CONTRAST:  154m OMNIPAQUE IOHEXOL 300 MG/ML  SOLN COMPARISON:  Tibia/fibula and foot radiographs earlier today. FINDINGS: Imaging obtained from the hip to the foot. Bones/Joint/Cartilage Patchy areas of decreased density and erosions throughout the tarsal bones. There are erosive changes in the cuboid, all cuneiform so and to a lesser extent navicular. There is intra osseous air within the navicular. Patchy air within the intertarsal joints and subtalar joint the foot. Previous resection of the fifth ray. There is mild smooth periosteal thickening about the distal tibia and fibula.Well corticated defect of the lateral femoral condyle at the knee joint appears chronic. Ligaments Suboptimally assessed by CT. Muscles and Tendons Diffuse intramuscular air and fluid collections throughout the foot. The largest air-fluid collection is in the dorsum of the foot, 6.3 x 2.6 cm series  2, image 653. Intramuscular gas tracks in the anterior compartment of the lower leg to the level of the proximal tibia/fibular articulation. No intramuscular gas proximal to the knee. Soft tissues Circumferential subcutaneous edema about the distal aspect of the lower leg. There is subcutaneous edema and skin thickening throughout the foot. Patchy soft tissue gas throughout the foot with a large plantar soft tissue defect extending to the cuboid. IMPRESSION: 1. Plantar soft tissue defect in the foot extending to the cuboid with osteomyelitis. Osteomyelitis involves the tarsal bones, cuboid, all cuneiforms and navicular. There is also intraosseous air in the navicular. 2. Extensive soft tissue and intramuscular gas extending from the level of the plantar wound throughout the foot. Heterogeneous air-fluid collections throughout  the foot with dominant collection dorsally. Intra-articular air throughout the midfoot and subtalar joint. Findings consistent with gas gangrene and possible necrotizing soft tissue infection. 3. Soft tissue and intramuscular gas tracks proximally in the anterior compartment to the level of the proximal tibia/fibular articulation. Smooth periosteal thickening of the mid and distal tibia and fibula have a chronic appearance. These results were called by telephone at the time of interpretation on 11/22/2022 at 12:50 am to provider NP Jerrye Bushy , who verbally acknowledged these results. Electronically Signed   By: Keith Rake M.D.   On: 11/22/2022 00:51   DG Tibia/Fibula Right  Result Date: 11/21/2022 CLINICAL DATA:  Evaluate gas gangrene in right lower extremity, initial encounter EXAM: RIGHT TIBIA AND FIBULA - 2 VIEW COMPARISON:  Foot films from earlier in the same day. FINDINGS: There is extensive air identified in the subcutaneous tissues along the lateral aspect of the lower leg extending from the foot. These extend to the level of the knee joint. No bony erosive changes are  identified. No definitive erosive changes are seen in the tibia and fibula. Stable changes in the foot are noted similar to that seen earlier on the same day. IMPRESSION: Progression of subcutaneous air to the level of the knee joint. No abnormality involving the tibia and fibula is seen. Electronically Signed   By: Inez Catalina M.D.   On: 11/21/2022 21:23   DG Foot Complete Right  Result Date: 11/21/2022 CLINICAL DATA:  Osteomyelitis EXAM: RIGHT FOOT COMPLETE - 3+ VIEW COMPARISON:  None Available. FINDINGS: Right fifth ray amputation has been performed. There is extensive soft tissue swelling of the right foot and extensive subcutaneous gas noted within the dorsum of the right forefoot and midfoot. There is, additionally, subcutaneous gas and erosive changes involving the third metatarsophalangeal joint in keeping with osteomyelitis/septic arthritis in this location. There is periosteal reaction involving the base of the fourth metatarsal as well as periarticular lucencies involving the cuboid and fourth metatarsal suspicious for changes of osteomyelitis in these locations. Advanced vascular calcifications noted. No fracture. IMPRESSION: 1. Extensive soft tissue swelling and subcutaneous gas within the dorsum of the right forefoot and midfoot. Correlation for aggressive soft tissue infection is recommended. 2. Findings in keeping with osteomyelitis/septic arthritis involving the third metatarsophalangeal joint. 3. Suspected periarticular osteomyelitis involving the base of the fourth metatarsal and cuboid. 4. Right fifth ray amputation. Electronically Signed   By: Fidela Salisbury M.D.   On: 11/21/2022 20:05        Scheduled Meds:  heparin  5,000 Units Subcutaneous Q8H   insulin aspart  0-5 Units Subcutaneous QHS   insulin aspart  0-9 Units Subcutaneous TID WC   prednisoLONE acetate  1 drop Both Eyes Q4H   rosuvastatin  40 mg Oral QHS   Continuous Infusions:  ceFEPime (MAXIPIME) IV 2 g (11/23/22  0529)   linezolid (ZYVOX) IV 600 mg (11/23/22 1226)   metronidazole 500 mg (11/23/22 1113)   potassium chloride     sodium chloride       LOS: 2 days    Sidney Ace, MD [facility]   If 7PM-7AM, please contact night-coverage  11/23/2022, 12:36 PM

## 2022-11-23 NOTE — Anesthesia Preprocedure Evaluation (Addendum)
Anesthesia Evaluation  Patient identified by MRN, date of birth, ID band Patient awake    Reviewed: Allergy & Precautions, NPO status , Patient's Chart, lab work & pertinent test results  History of Anesthesia Complications Negative for: history of anesthetic complications  Airway Mallampati: III  TM Distance: >3 FB Neck ROM: full    Dental no notable dental hx. (+) Dental Advidsory Given, Poor Dentition   Pulmonary neg pulmonary ROS, neg shortness of breath, neg COPD   Pulmonary exam normal        Cardiovascular hypertension, (-) angina + Peripheral Vascular Disease  (-) Past MI and (-) CABG Normal cardiovascular exam  ECG 11/21/22: Sinus tachycardia with 1st degree A-V block with Fusion complexes Nonspecific ST and T wave abnormality   Neuro/Psych Diabetic retinopathy negative neurological ROS  negative psych ROS   GI/Hepatic negative GI ROS, Neg liver ROS,,,  Endo/Other  diabetes, Type 2, Oral Hypoglycemic Agents    Renal/GU Renal diseaseAKI   BPH negative genitourinary   Musculoskeletal   Abdominal Normal abdominal exam  (+)   Peds  Hematology negative hematology ROS (+)   Anesthesia Other Findings Necrotizing fasciitis s/p R BKA on 11/22/22, now with gas gangrene requiring R AKA.  Reproductive/Obstetrics negative OB ROS                             Anesthesia Physical Anesthesia Plan  ASA: 4 and emergent  Anesthesia Plan: General   Post-op Pain Management: Ofirmev IV (intra-op)*   Induction: Intravenous  PONV Risk Score and Plan: 2 and Ondansetron, Dexamethasone and Midazolam  Airway Management Planned: Oral ETT  Additional Equipment:   Intra-op Plan:   Post-operative Plan: Extubation in OR  Informed Consent: I have reviewed the patients History and Physical, chart, labs and discussed the procedure including the risks, benefits and alternatives for the proposed  anesthesia with the patient or authorized representative who has indicated his/her understanding and acceptance.     Dental Advisory Given  Plan Discussed with: Anesthesiologist, CRNA and Surgeon  Anesthesia Plan Comments: (Patient consented for risks of anesthesia including but not limited to:  - adverse reactions to medications - damage to eyes, teeth, lips or other oral mucosa - nerve damage due to positioning  - sore throat or hoarseness - Damage to heart, brain, nerves, lungs, other parts of body or loss of life  Patient voiced understanding.)        Anesthesia Quick Evaluation

## 2022-11-23 NOTE — Progress Notes (Addendum)
Pt made aware of respiratory panel nasal swab that was order before the surgery in the morning but pt refused to be swab but was educated about its importance.. MD Eddie Dibbles made aware. Incoming shift made aware.Will continue to monitor.

## 2022-11-24 ENCOUNTER — Ambulatory Visit: Payer: Managed Care, Other (non HMO) | Attending: Cardiology | Admitting: Cardiology

## 2022-11-24 ENCOUNTER — Inpatient Hospital Stay: Payer: Managed Care, Other (non HMO) | Admitting: Certified Registered Nurse Anesthetist

## 2022-11-24 ENCOUNTER — Encounter
Admission: EM | Disposition: A | Discharge status: Home Health | Payer: Self-pay | Source: Home / Self Care | Attending: Internal Medicine

## 2022-11-24 ENCOUNTER — Encounter: Payer: Self-pay | Admitting: Internal Medicine

## 2022-11-24 ENCOUNTER — Other Ambulatory Visit: Payer: Self-pay

## 2022-11-24 DIAGNOSIS — A480 Gas gangrene: Secondary | ICD-10-CM | POA: Diagnosis not present

## 2022-11-24 DIAGNOSIS — I96 Gangrene, not elsewhere classified: Secondary | ICD-10-CM

## 2022-11-24 HISTORY — PX: AMPUTATION: SHX166

## 2022-11-24 LAB — COMPREHENSIVE METABOLIC PANEL
ALT: 8 U/L (ref 0–44)
AST: 14 U/L — ABNORMAL LOW (ref 15–41)
Albumin: <1.5 g/dL — ABNORMAL LOW (ref 3.5–5.0)
Alkaline Phosphatase: 59 U/L (ref 38–126)
Anion gap: 12 (ref 5–15)
BUN: 26 mg/dL — ABNORMAL HIGH (ref 6–20)
CO2: 19 mmol/L — ABNORMAL LOW (ref 22–32)
Calcium: 8.6 mg/dL — ABNORMAL LOW (ref 8.9–10.3)
Chloride: 109 mmol/L (ref 98–111)
Creatinine, Ser: 0.92 mg/dL (ref 0.61–1.24)
GFR, Estimated: >60 mL/min (ref >60)
Glucose, Bld: 245 mg/dL — ABNORMAL HIGH (ref 70–99)
Potassium: 3.5 mmol/L (ref 3.5–5.1)
Sodium: 140 mmol/L (ref 135–145)
Total Bilirubin: 1.3 mg/dL — ABNORMAL HIGH (ref 0.3–1.2)
Total Protein: 5.5 g/dL — ABNORMAL LOW (ref 6.5–8.1)

## 2022-11-24 LAB — URINALYSIS, ROUTINE W REFLEX MICROSCOPIC
Bacteria, UA: NONE SEEN
Bilirubin Urine: NEGATIVE
Glucose, UA: >=500 mg/dL — AB
Ketones, ur: 20 mg/dL — AB
Leukocytes,Ua: NEGATIVE
Nitrite: NEGATIVE
Protein, ur: 30 mg/dL — AB
Specific Gravity, Urine: 1.025 (ref 1.005–1.030)
Squamous Epithelial / HPF: NONE SEEN (ref 0–5)
pH: 5 (ref 5.0–8.0)

## 2022-11-24 LAB — GLUCOSE, CAPILLARY
Glucose-Capillary: 205 mg/dL — ABNORMAL HIGH (ref 70–99)
Glucose-Capillary: 211 mg/dL — ABNORMAL HIGH (ref 70–99)
Glucose-Capillary: 193 mg/dL — ABNORMAL HIGH (ref 70–99)
Glucose-Capillary: 220 mg/dL — ABNORMAL HIGH (ref 70–99)

## 2022-11-24 LAB — MAGNESIUM: Magnesium: 1.8 mg/dL (ref 1.7–2.4)

## 2022-11-24 LAB — SURGICAL PATHOLOGY

## 2022-11-24 LAB — CBC WITH DIFFERENTIAL/PLATELET
Abs Immature Granulocytes: 0.31 10*3/uL — ABNORMAL HIGH (ref 0.00–0.07)
Basophils Absolute: 0 10*3/uL (ref 0.0–0.1)
Basophils Relative: 0 %
Eosinophils Absolute: 0 10*3/uL (ref 0.0–0.5)
Eosinophils Relative: 0 %
HCT: 20.9 % — ABNORMAL LOW (ref 39.0–52.0)
Hemoglobin: 6.6 g/dL — ABNORMAL LOW (ref 13.0–17.0)
Immature Granulocytes: 3 %
Lymphocytes Relative: 5 %
Lymphs Abs: 0.5 10*3/uL — ABNORMAL LOW (ref 0.7–4.0)
MCH: 28.2 pg (ref 26.0–34.0)
MCHC: 31.6 g/dL (ref 30.0–36.0)
MCV: 89.3 fL (ref 80.0–100.0)
Monocytes Absolute: 0.2 10*3/uL (ref 0.1–1.0)
Monocytes Relative: 2 %
Neutro Abs: 9.5 10*3/uL — ABNORMAL HIGH (ref 1.7–7.7)
Neutrophils Relative %: 90 %
Platelets: 248 10*3/uL (ref 150–400)
RBC: 2.34 MIL/uL — ABNORMAL LOW (ref 4.22–5.81)
RDW: 14 % (ref 11.5–15.5)
WBC: 10.5 10*3/uL (ref 4.0–10.5)
nRBC: 0 % (ref 0.0–0.2)

## 2022-11-24 LAB — PREPARE RBC (CROSSMATCH)

## 2022-11-24 LAB — PHOSPHORUS: Phosphorus: 2 mg/dL — ABNORMAL LOW (ref 2.5–4.6)

## 2022-11-24 SURGERY — AMPUTATION, ABOVE KNEE
Anesthesia: General | Site: Knee | Laterality: Right

## 2022-11-24 MED ORDER — HYDROMORPHONE HCL 1 MG/ML IJ SOLN
1.0000 mg | Freq: Once | INTRAMUSCULAR | Status: AC | PRN
  Administered 2022-11-25: 1 mg via INTRAVENOUS
  Filled 2022-11-24: qty 1

## 2022-11-24 MED ORDER — POTASSIUM PHOSPHATES 15 MMOLE/5ML IV SOLN
20.0000 mmol | Freq: Once | INTRAVENOUS | Status: AC
  Administered 2022-11-24: 20 mmol via INTRAVENOUS
  Filled 2022-11-24: qty 6.67

## 2022-11-24 MED ORDER — BUPIVACAINE LIPOSOME 1.3 % IJ SUSP
INTRAMUSCULAR | Status: DC | PRN
  Administered 2022-11-24: 15 mL

## 2022-11-24 MED ORDER — BUPIVACAINE HCL (PF) 0.5 % IJ SOLN
INTRAMUSCULAR | Status: AC
  Filled 2022-11-24: qty 30

## 2022-11-24 MED ORDER — DEXAMETHASONE SODIUM PHOSPHATE 10 MG/ML IJ SOLN
INTRAMUSCULAR | Status: DC | PRN
  Administered 2022-11-24: 5 mg via INTRAVENOUS

## 2022-11-24 MED ORDER — MIDAZOLAM HCL 2 MG/2ML IJ SOLN
INTRAMUSCULAR | Status: AC
  Filled 2022-11-24: qty 2

## 2022-11-24 MED ORDER — LIDOCAINE HCL (PF) 2 % IJ SOLN
INTRAMUSCULAR | Status: AC
  Filled 2022-11-24: qty 5

## 2022-11-24 MED ORDER — ONDANSETRON HCL 4 MG/2ML IJ SOLN
INTRAMUSCULAR | Status: AC
  Filled 2022-11-24: qty 2

## 2022-11-24 MED ORDER — FENTANYL CITRATE PF 50 MCG/ML IJ SOSY: 12.5000 ug | PREFILLED_SYRINGE | Freq: Once | INTRAMUSCULAR | Status: DC | PRN

## 2022-11-24 MED ORDER — PROPOFOL 10 MG/ML IV BOLUS
INTRAVENOUS | Status: DC | PRN
  Administered 2022-11-24: 150 mg via INTRAVENOUS

## 2022-11-24 MED ORDER — FENTANYL CITRATE (PF) 100 MCG/2ML IJ SOLN
INTRAMUSCULAR | Status: DC | PRN
  Administered 2022-11-24 (×2): 50 ug via INTRAVENOUS

## 2022-11-24 MED ORDER — HYDROMORPHONE HCL 1 MG/ML IJ SOLN
INTRAMUSCULAR | Status: AC
  Filled 2022-11-24: qty 1

## 2022-11-24 MED ORDER — ACETAMINOPHEN 10 MG/ML IV SOLN
INTRAVENOUS | Status: AC
  Filled 2022-11-24: qty 100

## 2022-11-24 MED ORDER — INSULIN ASPART 100 UNIT/ML IJ SOLN
INTRAMUSCULAR | Status: AC
  Administered 2022-11-24: 2 [IU] via SUBCUTANEOUS
  Filled 2022-11-24: qty 1

## 2022-11-24 MED ORDER — CHLORHEXIDINE GLUCONATE 4 % EX LIQD
60.0000 mL | Freq: Once | CUTANEOUS | Status: AC
  Administered 2022-11-24: 4 via TOPICAL

## 2022-11-24 MED ORDER — LACTATED RINGERS IV SOLN: INTRAVENOUS | Status: DC

## 2022-11-24 MED ORDER — BUPIVACAINE LIPOSOME 1.3 % IJ SUSP
INTRAMUSCULAR | Status: AC
  Filled 2022-11-24: qty 20

## 2022-11-24 MED ORDER — ACETAMINOPHEN 10 MG/ML IV SOLN: 1000.0000 mg | Freq: Once | INTRAVENOUS | Status: DC | PRN

## 2022-11-24 MED ORDER — 0.9 % SODIUM CHLORIDE (POUR BTL) OPTIME
TOPICAL | Status: DC | PRN
  Administered 2022-11-24: 1500 mL

## 2022-11-24 MED ORDER — OXYCODONE HCL 5 MG/5ML PO SOLN: 5.0000 mg | Freq: Once | ORAL | Status: DC | PRN

## 2022-11-24 MED ORDER — FENTANYL CITRATE (PF) 100 MCG/2ML IJ SOLN: 25.0000 ug | INTRAMUSCULAR | Status: DC | PRN

## 2022-11-24 MED ORDER — FENTANYL CITRATE (PF) 100 MCG/2ML IJ SOLN
INTRAMUSCULAR | Status: AC
  Filled 2022-11-24: qty 2

## 2022-11-24 MED ORDER — ONDANSETRON HCL 4 MG/2ML IJ SOLN: 4.0000 mg | Freq: Once | INTRAMUSCULAR | Status: DC | PRN

## 2022-11-24 MED ORDER — ONDANSETRON HCL 4 MG/2ML IJ SOLN: 4.0000 mg | Freq: Four times a day (QID) | INTRAMUSCULAR | Status: DC | PRN

## 2022-11-24 MED ORDER — METHYLPREDNISOLONE SODIUM SUCC 125 MG IJ SOLR: 125.0000 mg | Freq: Once | INTRAMUSCULAR | Status: DC | PRN

## 2022-11-24 MED ORDER — SODIUM CHLORIDE 0.9 % IV SOLN
2.0000 g | Freq: Three times a day (TID) | INTRAVENOUS | Status: AC
  Administered 2022-11-24 – 2022-11-25 (×3): 2 g via INTRAVENOUS
  Filled 2022-11-24 (×3): qty 12.5

## 2022-11-24 MED ORDER — MAGNESIUM SULFATE 2 GM/50ML IV SOLN
2.0000 g | Freq: Once | INTRAVENOUS | Status: AC
  Administered 2022-11-24: 2 g via INTRAVENOUS
  Filled 2022-11-24: qty 50

## 2022-11-24 MED ORDER — SODIUM CHLORIDE 0.9 % IV SOLN: INTRAVENOUS | Status: DC

## 2022-11-24 MED ORDER — SUGAMMADEX SODIUM 200 MG/2ML IV SOLN
INTRAVENOUS | Status: DC | PRN
  Administered 2022-11-24: 200 mg via INTRAVENOUS

## 2022-11-24 MED ORDER — INSULIN ASPART 100 UNIT/ML IJ SOLN
5.0000 [IU] | Freq: Once | INTRAMUSCULAR | Status: AC
  Administered 2022-11-24: 5 [IU] via SUBCUTANEOUS

## 2022-11-24 MED ORDER — HYDROMORPHONE HCL 1 MG/ML IJ SOLN
INTRAMUSCULAR | Status: DC | PRN
  Administered 2022-11-24: .5 mg via INTRAVENOUS

## 2022-11-24 MED ORDER — CHLORHEXIDINE GLUCONATE 4 % EX LIQD: 60.0000 mL | Freq: Once | CUTANEOUS | Status: DC

## 2022-11-24 MED ORDER — DIPHENHYDRAMINE HCL 50 MG/ML IJ SOLN: 50.0000 mg | Freq: Once | INTRAMUSCULAR | Status: DC | PRN

## 2022-11-24 MED ORDER — SODIUM CHLORIDE 0.9% IV SOLUTION: Freq: Once | INTRAVENOUS | Status: AC

## 2022-11-24 MED ORDER — PHENYLEPHRINE HCL (PRESSORS) 10 MG/ML IV SOLN
INTRAVENOUS | Status: DC | PRN
  Administered 2022-11-24 (×3): 160 ug via INTRAVENOUS

## 2022-11-24 MED ORDER — ROCURONIUM BROMIDE 100 MG/10ML IV SOLN
INTRAVENOUS | Status: DC | PRN
  Administered 2022-11-24: 20 mg via INTRAVENOUS
  Administered 2022-11-24: 50 mg via INTRAVENOUS

## 2022-11-24 MED ORDER — FAMOTIDINE 20 MG PO TABS: 40.0000 mg | ORAL_TABLET | Freq: Once | ORAL | Status: DC | PRN

## 2022-11-24 MED ORDER — OXYCODONE HCL 5 MG PO TABS: 5.0000 mg | ORAL_TABLET | Freq: Once | ORAL | Status: DC | PRN

## 2022-11-24 MED ORDER — ONDANSETRON HCL 4 MG/2ML IJ SOLN
INTRAMUSCULAR | Status: DC | PRN
  Administered 2022-11-24: 4 mg via INTRAVENOUS

## 2022-11-24 MED ORDER — INSULIN GLARGINE-YFGN 100 UNIT/ML ~~LOC~~ SOLN
10.0000 [IU] | Freq: Every day | SUBCUTANEOUS | Status: DC
  Administered 2022-11-24 – 2022-11-28 (×5): 10 [IU] via SUBCUTANEOUS
  Filled 2022-11-24 (×6): qty 0.1

## 2022-11-24 MED ORDER — INSULIN ASPART 100 UNIT/ML IJ SOLN: 5.0000 [IU] | Freq: Once | INTRAMUSCULAR | Status: DC

## 2022-11-24 MED ORDER — LIDOCAINE HCL (CARDIAC) PF 100 MG/5ML IV SOSY
PREFILLED_SYRINGE | INTRAVENOUS | Status: DC | PRN
  Administered 2022-11-24: 100 mg via INTRAVENOUS

## 2022-11-24 SURGICAL SUPPLY — 59 items
BLADE SAGITTAL WIDE XTHICK NO (BLADE) ×1 IMPLANT
BLADE SAW SAG 25.4X90 (BLADE) ×1 IMPLANT
BLADE SURG SZ10 CARB STEEL (BLADE) ×1 IMPLANT
BNDG COHESIVE 4X5 TAN STRL LF (GAUZE/BANDAGES/DRESSINGS) ×2 IMPLANT
BNDG ELASTIC 6X5.8 VLCR NS LF (GAUZE/BANDAGES/DRESSINGS) ×2 IMPLANT
BNDG GAUZE DERMACEA FLUFF 4 (GAUZE/BANDAGES/DRESSINGS) ×3 IMPLANT
BRUSH SCRUB EZ  4% CHG (MISCELLANEOUS)
BRUSH SCRUB EZ 4% CHG (MISCELLANEOUS) ×1 IMPLANT
CANISTER WOUND CARE 500ML ATS (WOUND CARE) IMPLANT
CHLORAPREP W/TINT 26 (MISCELLANEOUS) ×2 IMPLANT
DERMABOND ADVANCED .7 DNX12 (GAUZE/BANDAGES/DRESSINGS) ×1 IMPLANT
DRAPE INCISE IOBAN 66X45 STRL (DRAPES) ×2 IMPLANT
DRAPE INCISE IOBAN 66X60 STRL (DRAPES) ×1 IMPLANT
DRSG GAUZE FLUFF 36X18 (GAUZE/BANDAGES/DRESSINGS) ×1 IMPLANT
DRSG VAC ATS MED SENSATRAC (GAUZE/BANDAGES/DRESSINGS) IMPLANT
DRSG XEROFORM 1X8 (GAUZE/BANDAGES/DRESSINGS) IMPLANT
ELECT CAUTERY BLADE 6.4 (BLADE) ×2 IMPLANT
ELECT REM PT RETURN 9FT ADLT (ELECTROSURGICAL) ×2
ELECTRODE REM PT RTRN 9FT ADLT (ELECTROSURGICAL) ×2 IMPLANT
GAUZE XEROFORM 1X8 LF (GAUZE/BANDAGES/DRESSINGS) ×2 IMPLANT
GLOVE BIO SURGEON STRL SZ7 (GLOVE) ×3 IMPLANT
GLOVE SURG SYN 8.0 (GLOVE) ×1 IMPLANT
GLOVE SURG SYN 8.0 PF PI (GLOVE) ×1 IMPLANT
GOWN STRL REUS W/ TWL LRG LVL3 (GOWN DISPOSABLE) ×4 IMPLANT
GOWN STRL REUS W/ TWL XL LVL3 (GOWN DISPOSABLE) ×2 IMPLANT
GOWN STRL REUS W/TWL LRG LVL3 (GOWN DISPOSABLE) ×4
GOWN STRL REUS W/TWL XL LVL3 (GOWN DISPOSABLE) ×2
HANDLE YANKAUER SUCT BULB TIP (MISCELLANEOUS) ×2 IMPLANT
KIT TURNOVER KIT A (KITS) ×2 IMPLANT
LABEL OR SOLS (LABEL) ×1 IMPLANT
MANIFOLD NEPTUNE II (INSTRUMENTS) ×2 IMPLANT
NDL HYPO 21X1.5 SAFETY (NEEDLE) ×1 IMPLANT
NEEDLE HYPO 21X1.5 SAFETY (NEEDLE) ×1 IMPLANT
NS IRRIG 1000ML POUR BTL (IV SOLUTION) ×1 IMPLANT
NS IRRIG 500ML POUR BTL (IV SOLUTION) ×1 IMPLANT
PACK EXTREMITY ARMC (MISCELLANEOUS) ×2 IMPLANT
PAD ABD DERMACEA PRESS 5X9 (GAUZE/BANDAGES/DRESSINGS) ×2 IMPLANT
PAD PREP 24X41 OB/GYN DISP (PERSONAL CARE ITEMS) ×2 IMPLANT
SPONGE T-LAP 18X18 ~~LOC~~+RFID (SPONGE) ×4 IMPLANT
STAPLER SKIN PROX 35W (STAPLE) ×1 IMPLANT
STOCKINETTE M/LG 89821 (MISCELLANEOUS) ×2 IMPLANT
SUT ETHIBOND 0 36 GRN (SUTURE) IMPLANT
SUT MNCRL 4-0 (SUTURE) ×1
SUT MNCRL 4-0 27XMFL (SUTURE) ×1
SUT SILK 2 0 (SUTURE) ×2
SUT SILK 2 0 SH (SUTURE) ×2 IMPLANT
SUT SILK 2-0 18XBRD TIE 12 (SUTURE) ×1 IMPLANT
SUT SILK 3 0 (SUTURE) ×1
SUT SILK 3-0 18XBRD TIE 12 (SUTURE) ×1 IMPLANT
SUT VIC AB 0 CT1 36 (SUTURE) ×8 IMPLANT
SUT VIC AB 2-0 CT1 (SUTURE) ×2 IMPLANT
SUT VIC AB 3-0 SH 27 (SUTURE) ×2
SUT VIC AB 3-0 SH 27X BRD (SUTURE) ×2 IMPLANT
SUT VICRYL PLUS ABS 0 54 (SUTURE) ×1 IMPLANT
SUTURE MNCRL 4-0 27XMF (SUTURE) ×1 IMPLANT
SYR 20ML LL LF (SYRINGE) ×2 IMPLANT
TAPE UMBILICAL 1/8 X36 TWILL (MISCELLANEOUS) ×1 IMPLANT
TRAP FLUID SMOKE EVACUATOR (MISCELLANEOUS) ×2 IMPLANT
WATER STERILE IRR 500ML POUR (IV SOLUTION) ×2 IMPLANT

## 2022-11-24 NOTE — Inpatient Diabetes Management (Signed)
Inpatient Diabetes Program Recommendations  AACE/ADA: New Consensus Statement on Inpatient Glycemic Control (2015)  Target Ranges:  Prepandial:   less than 140 mg/dL      Peak postprandial:   less than 180 mg/dL (1-2 hours)      Critically ill patients:  140 - 180 mg/dL   Lab Results  Component Value Date   GLUCAP 211 (H) 11/24/2022   HGBA1C 7.1 (A) 09/05/2022    Review of Glycemic Control  Latest Reference Range & Units 11/23/22 16:45 11/23/22 21:46 11/24/22 07:40 11/24/22 11:11  Glucose-Capillary 70 - 99 mg/dL 226 (H) 211 (H) 205 (H) 211 (H)  (H): Data is abnormally high  Diabetes history: Type 2 Dm Outpatient Diabetes medications: Jardiance 25 mg QD, Glipizide 5 mg qd, Rybelsus 14 mg QD Current orders for Inpatient glycemic control: Novolog 0-9 units TID & HS   Inpatient Diabetes Program Recommendations:     Consider adding Semglee 10 units QHS.   Will continue to follow while inpatient.  Thank you, Reche Dixon, MSN, [place] [facility] Coordinator Inpatient Diabetes Program [phone] (team pager from 8a-5p)

## 2022-11-24 NOTE — Progress Notes (Signed)
PROGRESS NOTE    [name]  RCV:[id number] DOB: [birth year] DOA: 11/21/2022 PCP: [name], MD    Brief Narrative:  56 year old gentleman with history of insulin-dependent diabetes, hypertension, hyperlipidemia presented to the emergency department with 4 days of feeling poor, weakness and loss of appetite, draining wound on the right fifth toe amputation site.  In the emergency room initially hemodynamically stable.  Was diagnosed with necrotizing fasciitis and underwent emergent amputation right BKA, revision right above-knee amputation 12/15.   Assessment & Plan:   Necrotizing fasciitis with gas gangrene: Aggressive resuscitation IV antibiotics with cefepime, Flagyl, vancomycin. 12/13, right transtibial amputation with wound VAC placement.  Spreading infection. 12/15, right above-knee amputation and closure.  Adequate pain medications. Surgical cultures with mixed organism, E. coli and staph. Blood cultures negative so far. With amputation to clean margin and negative blood cultures, continue coverage with cefepime and Flagyl today.  Will ultimately stop antibiotics. Postop management, physical therapy, rehab as per surgery.  Type 2 diabetes, uncontrolled with hyperglycemia: Blood sugars more than 200.  At home patient is on Jardiance, glipizide and Rybelsus.  Currently on sliding scale insulin.  Add long-acting insulin. Last known A1c 7.1.  Will recheck hemoglobin A1c.  Essential hypertension: Blood pressure stable.  On amlodipine.  Junctional rhythm with prolonged QR: Currently sinus rhythm.  Perioperative noted junctional rhythm.  Improved.  Electrolyte replacement.  Monitor on telemetry.  Electrolytes: Hypokalemia, replace further Hypomagnesemia, replace further to keep more than 2 Hypophosphatemia, replace further to keep more than 2.5  Anemia of acute blood loss and dilutional anemia: Hemoglobin 10-8-6.6 postop.  Blood pressure stable.  Patient consented.   Transfuse 1 unit of blood.    DVT prophylaxis: heparin injection 5,000 Units Start: 11/22/22 2200 Place TED hose Start: 11/21/22 2120   Code Status: Full code Family Communication: None at bedside Disposition Plan: Status is: Inpatient Remains inpatient appropriate because: Surgical interventions, IV antibiotics     Consultants:  Vascular surgery  Procedures:  Right BKA 12/13 Right AKA 12/15  Antimicrobials:  Cefepime, vancomycin and Flagyl 12/13---   Subjective: Patient seen and examined.  He was going to surgery in the morning.  Denied any complaints.  I reexamined him after surgical procedure.  Patient denies any complaints.  He denies any chest pain palpitations or pain.  Perioperatively he was noted to have some junctional rhythm as well as intermittent Mobitz type I AV block.  This is resolved now.  Objective: Vitals:   11/24/22 1140 11/24/22 1145 11/24/22 1151 11/24/22 1241  BP: (!) 101/54 98/62 (!) 92/57 112/71  Pulse: 70 87 96 86  Resp: '12 19 14 20  '$ Temp:  98 F (36.7 C)  98.2 F (36.8 C)  TempSrc:    Oral  SpO2: 100% 100% 100% 100%  Weight:      Height:        Intake/Output Summary (Last 24 hours) at 11/24/2022 1404 Last data filed at 11/24/2022 1158 Gross per 24 hour  Intake 2056.53 ml  Output 1350 ml  Net 706.53 ml   Filed Weights   11/21/22 1743 11/22/22 0447 11/24/22 0847  Weight: 85.7 kg 75.3 kg 75.3 kg    Examination:  General exam: Appears calm and comfortable  Debilitated.  Flat affect.  Not very keen to conversation. Respiratory system: Clear to auscultation. Respiratory effort normal. Cardiovascular system: S1 & S2 heard, RRR.  Gastrointestinal system: Abdomen is nondistended, soft and nontender. No organomegaly or masses felt. Normal bowel sounds heard. Central nervous system: Alert  and oriented. No focal neurological deficits. Extremities: Symmetric 5 x 5 power. Right above-knee amputation, bulky dressing with compression  dressing intact.  Not removed by me.    Data Reviewed: I have personally reviewed following labs and imaging studies  CBC: Recent Labs  Lab 11/21/22 1753 11/22/22 0459 11/23/22 0823 11/24/22 1232  WBC 16.9* 18.7* 13.3* 10.5  NEUTROABS  --   --  11.6* 9.5*  HGB 10.4* 8.6* 7.0* 6.6*  HCT 33.2* 26.7* 22.5* 20.9*  MCV 89.2 89.3 88.9 89.3  PLT 416* 317 265 326   Basic Metabolic Panel: Recent Labs  Lab 11/21/22 1753 11/22/22 0459 11/23/22 0823 11/24/22 1232  NA 137 137 140 140  K 2.6* 2.8* 3.7 3.5  CL 95* 103 111 109  CO2 15* 14* 19* 19*  GLUCOSE 215* 205* 172* 245*  BUN 52* 45* 32* 26*  CREATININE 1.69* 1.39* 1.06 0.92  CALCIUM 10.2 9.0 9.0 8.6*  MG  --  2.2  --  1.8  PHOS  --  3.6  --  2.0*   GFR: Estimated Creatinine Clearance: 96.6 mL/min (by C-G formula based on SCr of 0.92 mg/dL). Liver Function Tests: Recent Labs  Lab 11/24/22 1232  AST 14*  ALT 8  ALKPHOS 59  BILITOT 1.3*  PROT 5.5*  ALBUMIN <1.5*   No results for input(s): "LIPASE", "AMYLASE" in the last 168 hours. No results for input(s): "AMMONIA" in the last 168 hours. Coagulation Profile: No results for input(s): "INR", "PROTIME" in the last 168 hours. Cardiac Enzymes: No results for input(s): "CKTOTAL", "CKMB", "CKMBINDEX", "TROPONINI" in the last 168 hours. BNP (last 3 results) No results for input(s): "PROBNP" in the last 8760 hours. HbA1C: No results for input(s): "HGBA1C" in the last 72 hours. CBG: Recent Labs  Lab 11/23/22 1143 11/23/22 1645 11/23/22 2146 11/24/22 0740 11/24/22 1111  GLUCAP 221*  221* 226* 211* 205* 211*   Lipid Profile: No results for input(s): "CHOL", "HDL", "LDLCALC", "TRIG", "CHOLHDL", "LDLDIRECT" in the last 72 hours. Thyroid Function Tests: No results for input(s): "TSH", "T4TOTAL", "FREET4", "T3FREE", "THYROIDAB" in the last 72 hours. Anemia Panel: No results for input(s): "VITAMINB12", "FOLATE", "FERRITIN", "TIBC", "IRON", "RETICCTPCT" in the last 72  hours. Sepsis Labs: Recent Labs  Lab 11/21/22 1753  LATICACIDVEN 1.9    Recent Results (from the past 240 hour(s))  Culture, blood (Routine X 2) w Reflex to ID Panel     Status: None (Preliminary result)   Collection Time: 11/21/22  9:26 PM   Specimen: BLOOD  Result Value Ref Range Status   Specimen Description BLOOD LEFT HAND  Final   Special Requests   Final    BOTTLES DRAWN AEROBIC AND ANAEROBIC Blood Culture results may not be optimal due to an inadequate volume of blood received in culture bottles   Culture   Final    NO GROWTH 3 DAYS Performed at [facility], [address], [place], [place] [address]    Report Status PENDING  Incomplete  Culture, blood (Routine X 2) w Reflex to ID Panel     Status: None (Preliminary result)   Collection Time: 11/21/22  9:47 PM   Specimen: BLOOD  Result Value Ref Range Status   Specimen Description BLOOD RIGHT ASSIST CONTROL  Final   Special Requests   Final    BOTTLES DRAWN AEROBIC AND ANAEROBIC Blood Culture adequate volume   Culture   Final    NO GROWTH 3 DAYS Performed at [facility], [address] [place],  [place] [address]    Report Status PENDING  Incomplete  Aerobic/Anaerobic Culture w Gram Stain (surgical/deep wound)     Status: None (Preliminary result)   Collection Time: 11/22/22  1:38 AM   Specimen: PATH Other; Tissue  Result Value Ref Range Status   Specimen Description   Final    WOUND RIGHT LEG Performed at [facility], [address], [place], [place] [address]    Special Requests   Final    NONE Performed at [facility], [place], [place] [address]    Gram Stain   Final    NO WBC SEEN ABUNDANT GRAM POSITIVE COCCI IN PAIRS RARE GRAM POSITIVE COCCI IN CHAINS Performed at [facility], [place] [address], [place], [place] [address]    Culture   Final    RARE ESCHERICHIA COLI ABUNDANT GROUP B STREP(S.AGALACTIAE)ISOLATED TESTING AGAINST S. AGALACTIAE  NOT ROUTINELY PERFORMED DUE TO PREDICTABILITY OF AMP/PEN/VAN SUSCEPTIBILITY. NO ANAEROBES ISOLATED; CULTURE IN PROGRESS FOR 5 DAYS    Report Status PENDING  Incomplete   Organism ID, Bacteria ESCHERICHIA COLI  Final      Susceptibility   Escherichia coli - MIC*    AMPICILLIN >=32 RESISTANT Resistant     CEFAZOLIN <=4 SENSITIVE Sensitive     CEFEPIME <=0.12 SENSITIVE Sensitive     CEFTAZIDIME <=1 SENSITIVE Sensitive     CEFTRIAXONE <=0.25 SENSITIVE Sensitive     CIPROFLOXACIN <=0.25 SENSITIVE Sensitive     GENTAMICIN <=1 SENSITIVE Sensitive     IMIPENEM <=0.25 SENSITIVE Sensitive     TRIMETH/SULFA >=320 RESISTANT Resistant     AMPICILLIN/SULBACTAM 16 INTERMEDIATE Intermediate     PIP/TAZO <=4 SENSITIVE Sensitive     * RARE ESCHERICHIA COLI         Radiology Studies: No results found.      Scheduled Meds:  [START ON 11/25/2022] chlorhexidine  60 mL Topical Once   heparin  5,000 Units Subcutaneous Q8H   insulin aspart       insulin aspart  0-5 Units Subcutaneous QHS   insulin aspart  0-9 Units Subcutaneous TID WC   insulin glargine-yfgn  10 Units Subcutaneous QHS   prednisoLONE acetate  1 drop Both Eyes Q4H   rosuvastatin  40 mg Oral QHS   Continuous Infusions:  ceFEPime (MAXIPIME) IV 2 g (11/24/22 1327)   magnesium sulfate bolus IVPB     metronidazole Stopped (11/24/22 0124)   potassium PHOSPHATE IVPB (in mmol)     sodium chloride       LOS: 3 days    Time spent: 35 minutes    Barb Merino, MD [facility] Pager [phone]

## 2022-11-24 NOTE — Anesthesia Procedure Notes (Signed)
Procedure Name: Intubation Date/Time: 11/24/2022 9:21 AM  Performed by: Johnna Acosta, CRNAPre-anesthesia Checklist: Patient identified, Patient being monitored, Timeout performed, Emergency Drugs available and Suction available Patient Re-evaluated:Patient Re-evaluated prior to induction Oxygen Delivery Method: Circle system utilized Preoxygenation: Pre-oxygenation with 100% oxygen Induction Type: IV induction Ventilation: Mask ventilation without difficulty Laryngoscope Size: McGraph and 4 Grade View: Grade I Tube type: Oral Tube size: 7.5 mm Number of attempts: 1 Airway Equipment and Method: Stylet and Video-laryngoscopy Placement Confirmation: ETT inserted through vocal cords under direct vision, positive ETCO2 and breath sounds checked- equal and bilateral Secured at: 21 cm Tube secured with: Tape Dental Injury: Teeth and Oropharynx as per pre-operative assessment

## 2022-11-24 NOTE — Progress Notes (Addendum)
Pharmacy Antibiotic Note  [name] is a 56 y.o. male admitted on 11/21/2022 with cellulitis.  Pharmacy has been consulted for cefepime dosing. W  Wound culture growing:   RARE ESCHERICHIA COLI ABUNDANT GROUP B STREP(S.AGALACTIAE)ISOLATED    Plan:  Day 3 on abx. Continue cefepime 2 g q8H, linezolid 600 mg BID (for anti-toxin effect), and flagyl 500 mg q12H. Possibly can de-escalate cefepime to ancef. Since growing GBS can d/c linezolid.      Height: '5\' 11"'$  (180.3 cm) Weight: 75.3 kg (166 lb 0.1 oz) IBW/kg (Calculated) : 75.3  Temp (24hrs), Avg:98.2 F (36.8 C), Min:97.9 F (36.6 C), Max:98.6 F (37 C)  Recent Labs  Lab 11/21/22 1753 11/22/22 0459 11/23/22 0823  WBC 16.9* 18.7* 13.3*  CREATININE 1.69* 1.39* 1.06  LATICACIDVEN 1.9  --   --      Estimated Creatinine Clearance: 83.9 mL/min (by C-G formula based on SCr of 1.06 mg/dL).    No Known Allergies  12/132Blood cx: collected  Antimicrobials this admission: 12/12 Cefepime >>  12/12 Vancomycin > 12/14  12/13 Linezolid >>  12/12 Flagyl >>    Thank you for allowing pharmacy to be a part of this patient's care.  Eleonore Chiquito, PharmD, BCPS 11/24/2022 9:55 AM

## 2022-11-24 NOTE — Interval H&P Note (Signed)
History and Physical Interval Note:  11/24/2022 8:56 AM  [name]  has presented today for surgery, with the diagnosis of Above Knee Amputation.  The various methods of treatment have been discussed with the patient and family. After consideration of risks, benefits and other options for treatment, the patient has consented to  Procedure(s): AMPUTATION ABOVE KNEE (Right) as a surgical intervention.  The patient's history has been reviewed, patient examined, no change in status, stable for surgery.  I have reviewed the patient's chart and labs.  Questions were answered to the patient's satisfaction.     Hortencia Pilar

## 2022-11-24 NOTE — Anesthesia Postprocedure Evaluation (Signed)
Anesthesia Post Note  Patient: [name]  Procedure(s) Performed: AMPUTATION ABOVE KNEE (Right: Knee)  Patient location during evaluation: PACU Anesthesia Type: General Level of consciousness: awake and alert Pain management: pain level controlled Vital Signs Assessment: post-procedure vital signs reviewed and stable Respiratory status: spontaneous breathing, nonlabored ventilation, respiratory function stable and patient connected to nasal cannula oxygen Cardiovascular status: blood pressure returned to baseline and stable Postop Assessment: no apparent nausea or vomiting Anesthetic complications: no  No notable events documented.   Last Vitals:  Vitals:   11/24/22 1434 11/24/22 1440  BP: 114/71 114/71  Pulse: 85 85  Resp: 18 18  Temp: 37.1 C 37.1 C  SpO2: 100% 100%    Last Pain:  Vitals:   11/24/22 1440  TempSrc: Oral  PainSc:                  Dimas Millin

## 2022-11-24 NOTE — Progress Notes (Addendum)
Pt arrived to pacu with irregular rhythm that appears to be 2nd degree heart block type I on monitor.  Dr Barbra Sarks notified and EKG done. Per CRNA pt was in regular rhythm during surgery with no hx of cardiac arrhythmias. Dr Barbra Sarks came to bedside.  Blood sugar 211, dr Barbra Sarks was notified while at bedside.      1147-per dr Barbra Sarks ok to go to floor, had PR length changes in previous EKG.  1150- pt now has HR in upper 90s that appears junctional, no P wave present but regular QRS.  Dr Barbra Sarks notified.  Ok to go to floor.  This RN will notified hospitalist of changes.

## 2022-11-24 NOTE — Op Note (Signed)
  [facility]   OPERATIVE NOTE   PROCEDURE:  Right above-the-knee amputation  PRE-OPERATIVE DIAGNOSIS: Right foot gangrene  POST-OPERATIVE DIAGNOSIS: same as above  SURGEON:  Katha Cabal, MD  ASSISTANT(S): Annalee Genta, NP  ANESTHESIA: general  ESTIMATED BLOOD LOSS: 50 cc  FINDING(S): Healthy skin and muscle bellies  SPECIMEN(S):  Right above-the-knee amputation  INDICATIONS:   ANTAR MILKS is a 56 y.o. male who presents with right foot and ankle gangrene.  The patient is scheduled for a right above-the-knee amputation.  I discussed in depth with the patient the risks, benefits, and alternatives to this procedure.  The patient is aware that the risk of this operation included but are not limited to:  bleeding, infection, myocardial infarction, stroke, death, failure to heal amputation wound, and possible need for more proximal amputation.  The patient is aware of the risks and agrees proceed forward with the procedure.  DESCRIPTION: After full informed written consent was obtained from the patient, the patient was taken to the operating room, and placed supine upon the operating table.  Prior to induction, the patient received IV antibiotics.  The patient was then prepped and draped in the standard fashion for a right above-the-knee amputation.  After obtaining adequate anesthesia, the patient was prepped and draped in the standard fashion for a above-the-knee amputation.  I marked out the anterior and posterior flaps for a fish-mouth type of amputation.  I made the incisions for these flaps, and then dissected through the subcutaneous tissue, fascia, and muscles circumferentially.  I elevated  the periosteal tissue 4-5 cm more proximal than the anterior skin flap.  I then transected the femur with a power saw at this level.  Then I smoothed out the rough edges of the bone.  At this point, the specimen was passed off the field as the above-the-knee  amputation.  At this point, I clamped all visibly bleeding arteries and veins using a combination of suture ligation with silk suture and electrocautery.   Bleeding continued to be controlled with electrocautery and suture ligature.  The stump was washed off with sterile normal saline and no further active bleeding was noted.  I reapproximated the anterior and posterior fascia  with interrupted stitches of 0 Vicryl.  This was completed along the entire length of anterior and posterior fascia until there were no more loose space in the fascial line. The subcutaneous tissue was then approximated with 2-0 vicryl sutures. The skin was then  reapproximated with 4-0 Monocryl subcuticular.  The stump was washed off and dried.  The incision was dressed with Xeroform and ABD pads, and  then fluffs were applied.  Kerlix was wrapped around the leg and then gently an ACE wrap was applied.  A large Ioban was then placed over the ACE wrap to secure the dressing. The patient was then awakened and take to the recovery room in stable condition.   COMPLICATIONS: none  CONDITION: stable  Hortencia Pilar  11/24/2022, 10:50 AM   This note was created with Dragon Medical transcription system. Any errors in dictation are purely unintentional.

## 2022-11-24 NOTE — Transfer of Care (Signed)
Immediate Anesthesia Transfer of Care Note  Patient: [name]  Procedure(s) Performed: AMPUTATION ABOVE KNEE (Right: Knee)  Patient Location: PACU  Anesthesia Type:General  Level of Consciousness: awake and drowsy  Airway & Oxygen Therapy: Patient Spontanous Breathing and Patient connected to face mask oxygen  Post-op Assessment: Report given to RN and Post -op Vital signs reviewed and stable  Post vital signs: Reviewed  Last Vitals:  Vitals Value Taken Time  BP 103/69 11/24/22 1111  Temp    Pulse 65 11/24/22 1112  Resp 16 11/24/22 1112  SpO2 100 % 11/24/22 1112  Vitals shown include unvalidated device data.  Last Pain:  Vitals:   11/24/22 0847  TempSrc: Temporal  PainSc: Asleep         Complications: No notable events documented.

## 2022-11-25 ENCOUNTER — Encounter: Payer: Self-pay | Admitting: Vascular Surgery

## 2022-11-25 DIAGNOSIS — A480 Gas gangrene: Secondary | ICD-10-CM | POA: Diagnosis not present

## 2022-11-25 LAB — GLUCOSE, CAPILLARY
Glucose-Capillary: 165 mg/dL — ABNORMAL HIGH (ref 70–99)
Glucose-Capillary: 253 mg/dL — ABNORMAL HIGH (ref 70–99)
Glucose-Capillary: 232 mg/dL — ABNORMAL HIGH (ref 70–99)
Glucose-Capillary: 245 mg/dL — ABNORMAL HIGH (ref 70–99)

## 2022-11-25 LAB — COMPREHENSIVE METABOLIC PANEL
ALT: 9 U/L (ref 0–44)
AST: 19 U/L (ref 15–41)
Albumin: 1.7 g/dL — ABNORMAL LOW (ref 3.5–5.0)
Alkaline Phosphatase: 62 U/L (ref 38–126)
Anion gap: 3 — ABNORMAL LOW (ref 5–15)
BUN: 22 mg/dL — ABNORMAL HIGH (ref 6–20)
CO2: 24 mmol/L (ref 22–32)
Calcium: 8.8 mg/dL — ABNORMAL LOW (ref 8.9–10.3)
Chloride: 111 mmol/L (ref 98–111)
Creatinine, Ser: 0.81 mg/dL (ref 0.61–1.24)
GFR, Estimated: >60 mL/min (ref >60)
Glucose, Bld: 164 mg/dL — ABNORMAL HIGH (ref 70–99)
Potassium: 3.3 mmol/L — ABNORMAL LOW (ref 3.5–5.1)
Sodium: 138 mmol/L (ref 135–145)
Total Bilirubin: 1.2 mg/dL (ref 0.3–1.2)
Total Protein: 5.8 g/dL — ABNORMAL LOW (ref 6.5–8.1)

## 2022-11-25 LAB — CBC WITH DIFFERENTIAL/PLATELET
Abs Immature Granulocytes: 0.16 10*3/uL — ABNORMAL HIGH (ref 0.00–0.07)
Basophils Absolute: 0 10*3/uL (ref 0.0–0.1)
Basophils Relative: 0 %
Eosinophils Absolute: 0 10*3/uL (ref 0.0–0.5)
Eosinophils Relative: 0 %
HCT: 25 % — ABNORMAL LOW (ref 39.0–52.0)
Hemoglobin: 8.2 g/dL — ABNORMAL LOW (ref 13.0–17.0)
Immature Granulocytes: 1 %
Lymphocytes Relative: 9 %
Lymphs Abs: 1 10*3/uL (ref 0.7–4.0)
MCH: 28.7 pg (ref 26.0–34.0)
MCHC: 32.8 g/dL (ref 30.0–36.0)
MCV: 87.4 fL (ref 80.0–100.0)
Monocytes Absolute: 0.7 10*3/uL (ref 0.1–1.0)
Monocytes Relative: 6 %
Neutro Abs: 10 10*3/uL — ABNORMAL HIGH (ref 1.7–7.7)
Neutrophils Relative %: 84 %
Platelets: 230 10*3/uL (ref 150–400)
RBC: 2.86 MIL/uL — ABNORMAL LOW (ref 4.22–5.81)
RDW: 14.3 % (ref 11.5–15.5)
WBC: 11.9 10*3/uL — ABNORMAL HIGH (ref 4.0–10.5)
nRBC: 0 % (ref 0.0–0.2)

## 2022-11-25 LAB — PHOSPHORUS: Phosphorus: 2.2 mg/dL — ABNORMAL LOW (ref 2.5–4.6)

## 2022-11-25 LAB — MAGNESIUM: Magnesium: 2.3 mg/dL (ref 1.7–2.4)

## 2022-11-25 MED ORDER — POTASSIUM PHOSPHATES 15 MMOLE/5ML IV SOLN
20.0000 mmol | Freq: Once | INTRAVENOUS | Status: AC
  Administered 2022-11-25: 20 mmol via INTRAVENOUS
  Filled 2022-11-25: qty 6.67

## 2022-11-25 NOTE — Progress Notes (Signed)
      Daily Progress Note   Assessment/Planning:   POD #1 s/p R AKA  Bdg off on Monday   Subjective  - 1 Day Post-Op   No events overnight, sleeping comfortably   Objective   Vitals:   11/24/22 1730 11/24/22 1935 11/24/22 2324 11/25/22 0400  BP: 120/80 118/77 134/84 126/75  Pulse: 88 81 79 73  Resp:  '18 18 18  '$ Temp: 98.7 F (37.1 C) 97.8 F (36.6 C) 98.7 F (37.1 C) 98.1 F (36.7 C)  TempSrc: Oral     SpO2: 100% 100% 100% 100%  Weight:      Height:         Intake/Output Summary (Last 24 hours) at 11/25/2022 0738 Last data filed at 11/25/2022 0500 Gross per 24 hour  Intake 2334 ml  Output 1250 ml  Net 1084 ml    VASC R AKA bandaged without active bleeding.    Laboratory   CBC    Latest Ref Rng & Units 11/25/2022    5:11 AM 11/24/2022   12:32 PM 11/23/2022    8:23 AM  CBC  WBC 4.0 - 10.5 K/uL 11.9  10.5  13.3   Hemoglobin 13.0 - 17.0 g/dL 8.2  6.6  7.0   Hematocrit 39.0 - 52.0 % 25.0  20.9  22.5   Platelets 150 - 400 K/uL 230  248  265     BMET    Component Value Date/Time   NA 138 11/25/2022 0511   NA 141 09/06/2022 1417   K 3.3 (L) 11/25/2022 0511   CL 111 11/25/2022 0511   CO2 24 11/25/2022 0511   GLUCOSE 164 (H) 11/25/2022 0511   BUN 22 (H) 11/25/2022 0511   BUN 25 (H) 09/06/2022 1417   CREATININE 0.81 11/25/2022 0511   CREATININE 0.79 11/09/2021 0902   CALCIUM 8.8 (L) 11/25/2022 0511   GFRNONAA >60 11/25/2022 0511   GFRNONAA 83 02/09/2021 1135   GFRAA 96 02/09/2021 1135     Adele Barthel, MD, FACS, FSVS Covering for [facility]: [phone]  11/25/2022, 7:38 AM

## 2022-11-25 NOTE — Plan of Care (Signed)

## 2022-11-25 NOTE — TOC Initial Note (Signed)
Transition of Care [facility]) - Initial/Assessment Note    Patient Details  Name: [name] MRN: [id number] Date of Birth: 1966/05/20  [facility] CM/SW Contact:    Magnus Ivan, LCSW Phone Number: 11/25/2022, 11:53 AM  Clinical Narrative:                 Patient had SDOH flag for food and utilities needs.  CSW spoke with patient who defers to his wife. Spoke with patient's wife. Patient lives home with his wife who drives him to appointments. PCP is Dr. Ancil Boozer. Pharmacy is [facility] on [address]  Patient has a wheelchair and walker at home.  Patient/wife are interested in food and utility resources, explained they will be on AVS and wife verbalized understanding.  Patient's wife stated PT talked to them about patient going to CIR at discharge and they are very interested in this.  TOC will continue to follow.   Expected Discharge Plan: IP Rehab Facility Barriers to Discharge: Continued Medical Work up   Patient Goals and CMS Choice Patient states their goals for this hospitalization and ongoing recovery are:: wants CIR CMS Medicare.gov Compare Post Acute Care list provided to:: Patient Choice offered to / list presented to : Patient, Spouse  Expected Discharge Plan and Services Expected Discharge Plan: [place]       Living arrangements for the past 2 months: Single Family Home                                      Prior Living Arrangements/Services Living arrangements for the past 2 months: Single Family Home Lives with:: Spouse Patient language and need for interpreter reviewed:: Yes Do you feel safe going back to the place where you live?: Yes      Need for Family Participation in Patient Care: Yes (Comment) Care giver support system in place?: Yes (comment) Current home services: DME Criminal Activity/Legal Involvement Pertinent to Current Situation/Hospitalization: No - Comment as needed  Activities of Daily  Living Home Assistive Devices/Equipment: Environmental consultant (specify type) (Front wheel walker) ADL Screening (condition at time of admission) Patient's cognitive ability adequate to safely complete daily activities?: Yes (Needs assistance sometimes) Is the patient deaf or have difficulty hearing?: No Does the patient have difficulty seeing, even when wearing glasses/contacts?: No Does the patient have difficulty concentrating, remembering, or making decisions?: No Patient able to express need for assistance with ADLs?: Yes Does the patient have difficulty dressing or bathing?: No Independently performs ADLs?: Yes (appropriate for developmental age) Does the patient have difficulty walking or climbing stairs?: Yes Weakness of Legs: Both Weakness of Arms/Hands: None  Permission Sought/Granted Permission sought to share information with : Facility Sport and exercise psychologist, Family Supports Permission granted to share information with : Yes, Verbal Permission Granted     Permission granted to share info w AGENCY: as needed  Permission granted to share info w Relationship: spouse     Emotional Assessment       Orientation: : Oriented to Self, Oriented to Place, Oriented to  Time, Oriented to Situation Alcohol / Substance Use: Not Applicable Psych Involvement: No (comment)  Admission diagnosis:  Gas gangrene ([place]) [A48.0] Soft tissue infection of foot [L08.9] Patient Active Problem List   Diagnosis Date Noted   Soft tissue infection of foot 11/21/2022   Elevated troponin 11/21/2022   Gas gangrene ([place]) 11/21/2022   Hypokalemia 11/21/2022  B12 deficiency 09/05/2022   Proliferative diabetic retinopathy of both eyes without macular edema associated with type 2 diabetes mellitus ([place]) 06/05/2022   PAD (peripheral artery disease) ([place]) 04/12/2021   AKI (acute kidney injury) ([place]) 04/12/2021   Secondary renal hyperparathyroidism ([place]) 03/19/2019   Diabetic ulcer of foot with fat layer exposed ([place])  12/18/2018   Dyslipidemia associated with type 2 diabetes mellitus ([place]) 07/03/2018   Vitamin D deficiency 04/11/2018   ED (erectile dysfunction) 04/10/2018   Lower urinary tract symptoms (LUTS) 02/09/2017   Hypertension 07/05/2015   Hyperglycemia due to type 2 diabetes mellitus ([place]) 07/05/2015   HLD (hyperlipidemia) 07/05/2015   PCP:  [name], MD Pharmacy:   [id number] [address], [place] [address] [place] [address] Phone: [phone] Fax: [place] [address] [place]) [place] [address] Phone: [phone] Fax: [phone]     Social Determinants of Health (SDOH) Interventions    Readmission Risk Interventions     No data to display

## 2022-11-25 NOTE — Discharge Instructions (Signed)
Food Resources  Agency Name: [facility] Agency Address: [address] [place], [place] [address] Phone: [phone] Website: [email or web address]  Service(s) Offered: Housing services, self-sufficiency, congregate meal  program, weatherization program, Administrator, sports program, emergency food assistance,  housing counseling, home ownership program, wheels -towork program. Meals free for 60 and older at various  locations from 9am-1pm, Monday-Friday:  [employer], [place], [place], [address] [place]   [facility], [place] [phone] The [address], [address]   [place], [place]  Agency Name: [facility] Address: [place] [address] [place], [place], [place] [address] Phone: [phone] Website: [email or web address] Service(s) Offered: Home delivered hot, frozen, and emergency  meals. Grocery assistance program which matches  volunteers one-on-one with seniors unable to grocery shop  for themselves. Must be 60 years and older; less than 20  hours of in-home aide service, limited or no driving ability;  live alone or with someone with a disability; live in  [place].  Agency Name: Software engineer ([place]) Address: [address] [place], [place] [address] Phone: [phone] Service(s) Offered: Food is served to [place], homeless, elderly, and low  income people in the community every Saturday (11:30  am-12:30 pm) and Sunday (12:30 pm-1:30pm). Volunteers  also offer help and encouragement in seeking employment,  and spiritual guidance. April 05, 2017 8  Agency Name: Department of Social Services Address: [address] [place], [place] [address] Phone: [phone] Service(s) Offered: Child support services; child welfare services; food stamps;  Medicaid;  work first family assistance; and aid with fuel,  rent, food and medicine.  Agency Name: Chemical engineer Address: [address] [place], [place] Phone: [phone] Website: [email or web address] Services Offered: Monday 10:00am-12:00, 8:00pm-9:00pm, and Friday  10:00am-12:00.  Agency Name: [facility] [place] Address: [address], [place], [place] [address] Phone: [phone] Website: [email or web address] Service(s) Offered: Serves weekday meals, open from 11:30 am- 1:00 pm., and  6:30-7:30pm, Monday-Wednesday-Friday distributes food  3:30-6pm, Monday-Wednesday-Friday.  Agency Name: [facility] Address: [address], [place], [place] Phone: [phone] Website: [email or web address] Services Offered: Distributes food the 4th Saturday of the month, starting at  8:00 am  Agency Name: [facility] Address: [phone] [address], [place], [place] [address] Phone: [phone] Website: [email or web address] Service(s) Offered: Bread of life, weekly food pantry. Open Wednesdays from  10:00am-noon.  Agency Name: [facility] [facility] Address: [place], [place], [place] Phone: [phone] Services Offered: Distributes food 9am-1pm, Monday-Thursday. Call for details.  Agency Name: [place] Address: [address], [place], [place] [address] Phone: [phone] Website: [email or web address] Service(s) Offered: Youth worker. Call for assistance.  Agency Name: [facility] Address: [address] [place], [place] [address] Phone: [phone] Service Offered: Emergency Food Pantry. Call for appointment.  Agency Name: [place] Address: [address] [place], [place] [address] Phone: [phone] Website: [email or web address] Services Offered: Youth worker. Call for details  Agency Name: [facility] Address: [place], [place] Phone: [phone] Website: [email or web address] Service(s) Offered: Emergency Food Pantry. Call for details.  Agency Name: Solicitor Address: [place]. [address], [place], [place] [address] Phone: [phone] or [phone] Website: [email or web address] Service(s) Offered: Distribute food 9am-11:30 am, Tuesday-Friday, and 1- 3:30pm, Monday-Friday. Food pantry Monday-Friday  1pm-3pm, fresh items, Mon.-Wed.-Fri.  Agency Name: [place] [facility] Address: [address] [place], [place] [address] Phone: [phone] Website: [email or web address] Services Offered: Distribute food Tues and Sats from 9:00am-noon. Closed  1st Saturday of each month. Call for details        Utility Resources   Agency Name: [facility] Agency Address: [address] [place], [place] [address] Phone: [phone] Website: [email or web address] Service(s) Offered: Housing services, self-sufficiency, congregate meal  program, and individual development account program.  Agency Name: [facility] [place] Address: [address], [place], [place] [address] Phone: [phone] Email: [email or web address] Website: [email or web address] Service(s) Offered: Housing the homeless, feeding the hungry, Scientific laboratory technician, job and education related services.  Agency Name: [facility] Address: [address], [place], [place] [address] Phone: [phone] Email: [email or web address] Service(s) Offered: Counseling, problem pregnancy, advocacy for Hispanics,  limited emergency financial assistance.  Agency Name: Department of Social Services Address: [address] [place], [place] [address] Phone: [phone] Website: [email or web address] Service(s) Offered: Child support services; child welfare services; SNAPS;  Medicaid; work first family  assistance; and aid with fuel,  rent, food and medicine.  Agency Name: Solicitor Address: [place]. [address] [place], [place] [address] Phone: [phone] or [phone] Email: [email or web address] Service(s) Offered: Family services and transient assistance; emergency food,  fuel, clothing, limited furniture, utilities; budget counseling,  general counseling; give a kid a coat; thrift store; Christmas  food and toys. Utility assistance, food pantry, rental  assistance, life sustaining medicine

## 2022-11-25 NOTE — Evaluation (Signed)
Occupational Therapy Evaluation Patient Details Name: [name] MRN: [id number] DOB: 08-15-1966 Today's Date: 11/25/2022   History of Present Illness Pt is a 56 year old male s/p R BKA 11/22/22,  AKA 11/24/22; PMH significant for non-insulin-dependent diabetes mellitus, hypertension, hyperlipidemia   Clinical Impression   Chart reviewed, pt greeted in bed with wife present, agreeable to OT evaluation. Pt is alert and oriented x4, presents with flat affect throughout, increased time for processing. Good one step direction following. PTA pt is MOD I-I in ADL/IADL, reports he does not drive much, amb with no AD. Pt presents with deficits in strength, endurance, activity tolerance, balance all affecting safe and optimal ADL completion. MIN A required for bed mobility, CGA-MIN A for lateral scoot to bedside chair after MAX A required for STS with RW. MOD A required for LB bathing, MIN A for UB dressing, MAX A for LB dressing. At this time recommend discharge to intensive rehab to facilitate return to PLOF, optimal, safe ADL completion. Pt is left in bedside chair, all needs met. OT will follow acutely.      Recommendations for follow up therapy are one component of a multi-disciplinary discharge planning process, led by the attending physician.  Recommendations may be updated based on patient status, additional functional criteria and insurance authorization.   Follow Up Recommendations  Acute inpatient rehab (3hours/day)     Assistance Recommended at Discharge Intermittent Supervision/Assistance  Patient can return home with the following A lot of help with walking and/or transfers;A lot of help with bathing/dressing/bathroom    Functional Status Assessment  Patient has had a recent decline in their functional status and demonstrates the ability to make significant improvements in function in a reasonable and predictable amount of time.  Equipment Recommendations  Other (comment) (per next  venue of care)    Recommendations for Other Services       Precautions / Restrictions Precautions Precaution Comments: R AKA      Mobility Bed Mobility Overal bed mobility: Needs Assistance Bed Mobility: Supine to Sit     Supine to sit: Min assist, HOB elevated     General bed mobility comments: step by step vcs for technique    Transfers Overall transfer level: Needs assistance Equipment used: Rolling walker (2 wheels) Transfers: Sit to/from Stand Sit to Stand: Max assist, From elevated surface           General transfer comment: pt pushing hips forward, unsafe to attempt with +1 on this date      Balance Overall balance assessment: Needs assistance Sitting-balance support: Feet supported Sitting balance-Leahy Scale: Good     Standing balance support: Bilateral upper extremity supported, During functional activity Standing balance-Leahy Scale: Poor                             ADL either performed or assessed with clinical judgement   ADL Overall ADL's : Needs assistance/impaired Eating/Feeding: Set up;Sitting   Grooming: Wash/dry face;Wash/dry hands;Sitting;Supervision        Lower Body Bathing: Moderate assistance;Sitting/lateral leans   Upper Body Dressing : Minimal assistance;Sitting   Lower Body Dressing: Maximal assistance Lower Body Dressing Details (indicate cue type and reason): socks Toilet Transfer: Min guard;Minimal assistance Toilet Transfer Details (indicate cue type and reason): intermittent MIN A, lateral scoot to bedside chair Toileting- Clothing Manipulation and Hygiene: Moderate assistance;Sitting/lateral lean               Vision  Patient Visual Report: No change from baseline       Perception     Praxis      Pertinent Vitals/Pain Pain Assessment Pain Assessment: No/denies pain     Hand Dominance     Extremity/Trunk Assessment Upper Extremity Assessment Upper Extremity Assessment: Overall WFL for  tasks assessed (generally 4/5 throughout)   Lower Extremity Assessment Lower Extremity Assessment: Generalized weakness;RLE deficits/detail RLE Deficits / Details: R AKA       Communication Communication Communication: No difficulties   Cognition Arousal/Alertness: Awake/alert Behavior During Therapy: Flat affect Overall Cognitive Status: Impaired/Different from baseline Area of Impairment: Awareness, Problem solving, Following commands                       Following Commands: Follows one step commands with increased time   Awareness: Emergent Problem Solving: Slow processing       General Comments  vital signs monitored, appear stable throughout    Exercises Other Exercises Other Exercises: edu pt and wife re: role of OT, role of rehab, discharge recommendations, home safety, falls prevention, residual limb care, imporatnce of participation in therapy   Shoulder Instructions      Home Living Family/patient expects to be discharged to:: Private residence Living Arrangements: Spouse/significant other Available Help at Discharge: Family;Available PRN/intermittently;Available 24 hours/day (wife says initial 24/7) Type of Home: House Home Access: Stairs to enter [employer] of Steps: 3 Entrance Stairs-Rails: Right;Left Home Layout: One level     Bathroom Shower/Tub: Teacher, early years/pre: Standard     Home Equipment: Conservation officer, nature (2 wheels)          Prior Functioning/Environment Prior Level of Function : Independent/Modified Independent             Mobility Comments: amb without AD ADLs Comments: MOD I with ADL/IADL, rarely drives        OT Problem List: Decreased strength;Decreased activity tolerance;Impaired balance (sitting and/or standing);Decreased safety awareness;Decreased cognition;Decreased knowledge of use of DME or AE      OT Treatment/Interventions: Self-care/ADL training;Patient/family  education;Therapeutic exercise;Balance training;Energy conservation;Therapeutic activities;DME and/or AE instruction;Modalities    OT Goals(Current goals can be found in the care plan section) Acute Rehab OT Goals Patient Stated Goal: return to PLOF OT Goal Formulation: With patient/family Time For Goal Achievement: 12/09/22 Potential to Achieve Goals: Good ADL Goals Pt Will Perform Grooming: with modified independence;sitting Pt Will Perform Lower Body Dressing: with modified independence Pt Will Transfer to Toilet: with modified independence Pt Will Perform Toileting - Clothing Manipulation and hygiene: with modified independence;sit to/from stand  OT Frequency: Min 4X/week    Co-evaluation              AM-PAC OT "6 Clicks" Daily Activity     Outcome Measure Help from another person eating meals?: None Help from another person taking care of personal grooming?: None Help from another person toileting, which includes using toliet, bedpan, or urinal?: A Lot Help from another person bathing (including washing, rinsing, drying)?: A Lot Help from another person to put on and taking off regular upper body clothing?: A Little Help from another person to put on and taking off regular lower body clothing?: A Lot 6 Click Score: 17   End of Session Equipment Utilized During Treatment: Rolling walker (2 wheels) Nurse Communication: Mobility status  Activity Tolerance: Patient tolerated treatment well Patient left: in chair;with call bell/phone within reach;with chair alarm set;with family/visitor present  OT Visit Diagnosis: Unsteadiness on  feet (R26.81);Muscle weakness (generalized) (M62.81)                Time: 8022-3361 OT Time Calculation (min): 32 min Charges:  OT General Charges $OT Visit: 1 Visit OT Evaluation $OT Eval Moderate Complexity: 1 Mod  Shanon Payor, OTD OTR/L  11/25/22, 12:59 PM

## 2022-11-25 NOTE — Progress Notes (Signed)
PROGRESS NOTE    [name]  EVO:[id number] DOB: 05-Jan-1966 DOA: 11/21/2022 PCP: [name], MD    Brief Narrative:  56 year old gentleman with history of insulin-dependent diabetes, hypertension, hyperlipidemia presented to the emergency department with 4 days of feeling poor, weakness and loss of appetite, draining wound on the right fifth toe amputation site.  In the emergency room initially hemodynamically stable.  Was diagnosed with necrotizing fasciitis and underwent emergent amputation right BKA, revision right above-knee amputation 12/15.   Assessment & Plan:   Necrotizing fasciitis with gas gangrene: Aggressively resuscitated and is stabilized. IV antibiotics with cefepime, Flagyl, vancomycin. 12/13, right transtibial amputation with wound VAC placement.  Spreading infection. 12/15, right above-knee amputation and closure.  Pain controlled. Surgical cultures with E. coli.   Blood cultures negative so far. With amputation to clean margin and negative blood cultures, continue coverage with cefepime and Flagyl until surgical inspection of the wound.  Adequate pain management.  Start working with PT OT today.  Type 2 diabetes, uncontrolled with hyperglycemia: Blood sugars more than 200.  At home patient is on Jardiance, glipizide and Rybelsus.  Currently on sliding scale insulin.  Added long-acting insulin.  A1c pending.  Essential hypertension: Blood pressure stable.  On amlodipine.  Junctional rhythm with prolonged QR: Currently sinus rhythm with first-degree AV block.  Perioperative noted junctional rhythm.  Improved.  Electrolyte replacement.  Monitor on telemetry.  Electrolytes: Hypokalemia, replace further Hypomagnesemia, adequate. Hypophosphatemia, replace further to keep more than 2.5.  Recheck levels tomorrow morning.  Anemia of acute blood loss and dilutional anemia: Hemoglobin 10-8-6.6-1 unit of PRBC.  Appropriately responded.  Recheck tomorrow  morning.   DVT prophylaxis: heparin injection 5,000 Units Start: 11/22/22 2200 Place TED hose Start: 11/21/22 2120   Code Status: Full code Family Communication: Wife at the bedside but sleeping. Disposition Plan: Status is: Inpatient Remains inpatient appropriate because: Surgical interventions, IV antibiotics     Consultants:  Vascular surgery  Procedures:  Right BKA 12/13 Right AKA 12/15  Antimicrobials:  Cefepime and Flagyl 12/13---   Subjective:  Patient seen in the morning rounds.  Poor historian.  Flat affect.  Denies any complaints.  No other overnight events.  Telemetry with first-degree AV block but no arrhythmias.  Pain is controlled.  Objective: Vitals:   11/24/22 1935 11/24/22 2324 11/25/22 0400 11/25/22 0849  BP: 118/77 134/84 126/75 137/88  Pulse: 81 79 73 79  Resp: '18 18 18 20  '$ Temp: 97.8 F (36.6 C) 98.7 F (37.1 C) 98.1 F (36.7 C) 98.4 F (36.9 C)  TempSrc:    Oral  SpO2: 100% 100% 100% 100%  Weight:      Height:        Intake/Output Summary (Last 24 hours) at 11/25/2022 1057 Last data filed at 11/25/2022 0500 Gross per 24 hour  Intake 1534 ml  Output 1050 ml  Net 484 ml   Filed Weights   11/21/22 1743 11/22/22 0447 11/24/22 0847  Weight: 85.7 kg 75.3 kg 75.3 kg    Examination:  General exam: Appears calm and comfortable.  Patient with flat affect and not very keen to conversation but looks comfortable. Respiratory system: Clear to auscultation. Respiratory effort normal. Cardiovascular system: S1 & S2 heard, RRR.  Gastrointestinal system: Abdomen is nondistended, soft and nontender. No organomegaly or masses felt. Normal bowel sounds heard. Central nervous system: Alert and oriented. No focal neurological deficits. Extremities: Symmetric 5 x 5 power. Right above-knee amputation, postop compression dressing.  Wound VAC with empty  canister.    Data Reviewed: I have personally reviewed following labs and imaging  studies  CBC: Recent Labs  Lab 11/21/22 1753 11/22/22 0459 11/23/22 0823 11/24/22 1232 11/25/22 0511  WBC 16.9* 18.7* 13.3* 10.5 11.9*  NEUTROABS  --   --  11.6* 9.5* 10.0*  HGB 10.4* 8.6* 7.0* 6.6* 8.2*  HCT 33.2* 26.7* 22.5* 20.9* 25.0*  MCV 89.2 89.3 88.9 89.3 87.4  PLT 416* 317 265 248 297   Basic Metabolic Panel: Recent Labs  Lab 11/21/22 1753 11/22/22 0459 11/23/22 0823 11/24/22 1232 11/25/22 0511  NA 137 137 140 140 138  K 2.6* 2.8* 3.7 3.5 3.3*  CL 95* 103 111 109 111  CO2 15* 14* 19* 19* 24  GLUCOSE 215* 205* 172* 245* 164*  BUN 52* 45* 32* 26* 22*  CREATININE 1.69* 1.39* 1.06 0.92 0.81  CALCIUM 10.2 9.0 9.0 8.6* 8.8*  MG  --  2.2  --  1.8 2.3  PHOS  --  3.6  --  2.0* 2.2*   GFR: Estimated Creatinine Clearance: 109.7 mL/min (by C-G formula based on SCr of 0.81 mg/dL). Liver Function Tests: Recent Labs  Lab 11/24/22 1232 11/25/22 0511  AST 14* 19  ALT 8 9  ALKPHOS 59 62  BILITOT 1.3* 1.2  PROT 5.5* 5.8*  ALBUMIN <1.5* 1.7*   No results for input(s): "LIPASE", "AMYLASE" in the last 168 hours. No results for input(s): "AMMONIA" in the last 168 hours. Coagulation Profile: No results for input(s): "INR", "PROTIME" in the last 168 hours. Cardiac Enzymes: No results for input(s): "CKTOTAL", "CKMB", "CKMBINDEX", "TROPONINI" in the last 168 hours. BNP (last 3 results) No results for input(s): "PROBNP" in the last 8760 hours. HbA1C: No results for input(s): "HGBA1C" in the last 72 hours. CBG: Recent Labs  Lab 11/24/22 0740 11/24/22 1111 11/24/22 1551 11/24/22 2102 11/25/22 0850  GLUCAP 205* 211* 193* 220* 165*   Lipid Profile: No results for input(s): "CHOL", "HDL", "LDLCALC", "TRIG", "CHOLHDL", "LDLDIRECT" in the last 72 hours. Thyroid Function Tests: No results for input(s): "TSH", "T4TOTAL", "FREET4", "T3FREE", "THYROIDAB" in the last 72 hours. Anemia Panel: No results for input(s): "VITAMINB12", "FOLATE", "FERRITIN", "TIBC", "IRON",  "RETICCTPCT" in the last 72 hours. Sepsis Labs: Recent Labs  Lab 11/21/22 1753  LATICACIDVEN 1.9    Recent Results (from the past 240 hour(s))  Culture, blood (Routine X 2) w Reflex to ID Panel     Status: None (Preliminary result)   Collection Time: 11/21/22  9:26 PM   Specimen: BLOOD  Result Value Ref Range Status   Specimen Description BLOOD LEFT HAND  Final   Special Requests   Final    BOTTLES DRAWN AEROBIC AND ANAEROBIC Blood Culture results may not be optimal due to an inadequate volume of blood received in culture bottles   Culture   Final    NO GROWTH 3 DAYS Performed at [facility], [address], [place], [place] [address]    Report Status PENDING  Incomplete  Culture, blood (Routine X 2) w Reflex to ID Panel     Status: None (Preliminary result)   Collection Time: 11/21/22  9:47 PM   Specimen: BLOOD  Result Value Ref Range Status   Specimen Description BLOOD RIGHT ASSIST CONTROL  Final   Special Requests   Final    BOTTLES DRAWN AEROBIC AND ANAEROBIC Blood Culture adequate volume   Culture   Final    NO GROWTH 3 DAYS Performed at [facility], [place],  [place], [place] [address]    Report Status PENDING  Incomplete  Aerobic/Anaerobic Culture w Gram Stain (surgical/deep wound)     Status: None (Preliminary result)   Collection Time: 11/22/22  1:38 AM   Specimen: PATH Other; Tissue  Result Value Ref Range Status   Specimen Description   Final    WOUND RIGHT LEG Performed at [facility], [address], [place], [place] [address]    Special Requests   Final    NONE Performed at [facility], [place], [place] [address]    Gram Stain   Final    NO WBC SEEN ABUNDANT GRAM POSITIVE COCCI IN PAIRS RARE GRAM POSITIVE COCCI IN CHAINS Performed at [facility], [place] [address] [place], [place] [address]    Culture   Final    RARE ESCHERICHIA COLI ABUNDANT GROUP B  STREP(S.AGALACTIAE)ISOLATED TESTING AGAINST S. AGALACTIAE NOT ROUTINELY PERFORMED DUE TO PREDICTABILITY OF AMP/PEN/VAN SUSCEPTIBILITY. NO ANAEROBES ISOLATED; CULTURE IN PROGRESS FOR 5 DAYS    Report Status PENDING  Incomplete   Organism ID, Bacteria ESCHERICHIA COLI  Final      Susceptibility   Escherichia coli - MIC*    AMPICILLIN >=32 RESISTANT Resistant     CEFAZOLIN <=4 SENSITIVE Sensitive     CEFEPIME <=0.12 SENSITIVE Sensitive     CEFTAZIDIME <=1 SENSITIVE Sensitive     CEFTRIAXONE <=0.25 SENSITIVE Sensitive     CIPROFLOXACIN <=0.25 SENSITIVE Sensitive     GENTAMICIN <=1 SENSITIVE Sensitive     IMIPENEM <=0.25 SENSITIVE Sensitive     TRIMETH/SULFA >=320 RESISTANT Resistant     AMPICILLIN/SULBACTAM 16 INTERMEDIATE Intermediate     PIP/TAZO <=4 SENSITIVE Sensitive     * RARE ESCHERICHIA COLI         Radiology Studies: No results found.      Scheduled Meds:  chlorhexidine  60 mL Topical Once   heparin  5,000 Units Subcutaneous Q8H   insulin aspart  0-5 Units Subcutaneous QHS   insulin aspart  0-9 Units Subcutaneous TID WC   insulin glargine-yfgn  10 Units Subcutaneous QHS   prednisoLONE acetate  1 drop Both Eyes Q4H   rosuvastatin  40 mg Oral QHS   Continuous Infusions:  ceFEPime (MAXIPIME) IV 2 g (11/25/22 0532)   metronidazole 500 mg (11/24/22 2333)   potassium PHOSPHATE IVPB (in mmol) 20 mmol (11/25/22 0927)   sodium chloride       LOS: 4 days    Time spent: 35 minutes    Barb Merino, MD [facility] Pager [phone]

## 2022-11-25 NOTE — Progress Notes (Signed)
PT Cancellation Note  Patient Details Name: [name] MRN: [id number] DOB: Aug 12, 1966   Cancelled Treatment:    Reason Eval/Treat Not Completed: Fatigue/lethargy limiting ability to participate. Orders received and chart reviewed. Pt declining PT eval and OOB efforts due to fatigue. Requesting PT to return another time. Will re-attempt at a later time/date.    Salem Caster. Fairly IV, PT, DPT Physical Therapist- [facility]  11/25/2022, 2:32 PM

## 2022-11-26 DIAGNOSIS — A480 Gas gangrene: Secondary | ICD-10-CM | POA: Diagnosis not present

## 2022-11-26 LAB — CBC WITH DIFFERENTIAL/PLATELET
Abs Immature Granulocytes: 0.16 10*3/uL — ABNORMAL HIGH (ref 0.00–0.07)
Basophils Absolute: 0 10*3/uL (ref 0.0–0.1)
Basophils Relative: 0 %
Eosinophils Absolute: 0 10*3/uL (ref 0.0–0.5)
Eosinophils Relative: 0 %
HCT: 20.7 % — ABNORMAL LOW (ref 39.0–52.0)
Hemoglobin: 6.8 g/dL — ABNORMAL LOW (ref 13.0–17.0)
Immature Granulocytes: 1 %
Lymphocytes Relative: 9 %
Lymphs Abs: 1 10*3/uL (ref 0.7–4.0)
MCH: 28.6 pg (ref 26.0–34.0)
MCHC: 32.9 g/dL (ref 30.0–36.0)
MCV: 87 fL (ref 80.0–100.0)
Monocytes Absolute: 0.9 10*3/uL (ref 0.1–1.0)
Monocytes Relative: 8 %
Neutro Abs: 9.3 10*3/uL — ABNORMAL HIGH (ref 1.7–7.7)
Neutrophils Relative %: 82 %
Platelets: 223 10*3/uL (ref 150–400)
RBC: 2.38 MIL/uL — ABNORMAL LOW (ref 4.22–5.81)
RDW: 14 % (ref 11.5–15.5)
WBC: 11.3 10*3/uL — ABNORMAL HIGH (ref 4.0–10.5)
nRBC: 0 % (ref 0.0–0.2)

## 2022-11-26 LAB — CULTURE, BLOOD (ROUTINE X 2)
Culture: NO GROWTH
Culture: NO GROWTH
Special Requests: ADEQUATE

## 2022-11-26 LAB — BASIC METABOLIC PANEL
Anion gap: 3 — ABNORMAL LOW (ref 5–15)
BUN: 24 mg/dL — ABNORMAL HIGH (ref 6–20)
CO2: 27 mmol/L (ref 22–32)
Calcium: 8.4 mg/dL — ABNORMAL LOW (ref 8.9–10.3)
Chloride: 105 mmol/L (ref 98–111)
Creatinine, Ser: 0.88 mg/dL (ref 0.61–1.24)
GFR, Estimated: >60 mL/min (ref >60)
Glucose, Bld: 237 mg/dL — ABNORMAL HIGH (ref 70–99)
Potassium: 3.7 mmol/L (ref 3.5–5.1)
Sodium: 135 mmol/L (ref 135–145)

## 2022-11-26 LAB — GLUCOSE, CAPILLARY
Glucose-Capillary: 187 mg/dL — ABNORMAL HIGH (ref 70–99)
Glucose-Capillary: 188 mg/dL — ABNORMAL HIGH (ref 70–99)
Glucose-Capillary: 237 mg/dL — ABNORMAL HIGH (ref 70–99)
Glucose-Capillary: 221 mg/dL — ABNORMAL HIGH (ref 70–99)

## 2022-11-26 LAB — PHOSPHORUS: Phosphorus: 1.8 mg/dL — ABNORMAL LOW (ref 2.5–4.6)

## 2022-11-26 LAB — MAGNESIUM: Magnesium: 2.1 mg/dL (ref 1.7–2.4)

## 2022-11-26 LAB — PREPARE RBC (CROSSMATCH)

## 2022-11-26 MED ORDER — K PHOS MONO-SOD PHOS DI & MONO 155-852-130 MG PO TABS
250.0000 mg | ORAL_TABLET | Freq: Three times a day (TID) | ORAL | Status: DC
  Administered 2022-11-26 – 2022-11-29 (×10): 250 mg via ORAL
  Filled 2022-11-26 (×11): qty 1

## 2022-11-26 MED ORDER — SODIUM CHLORIDE 0.9% IV SOLUTION: Freq: Once | INTRAVENOUS | Status: AC

## 2022-11-26 NOTE — Progress Notes (Signed)
      Daily Progress Note   Assessment/Planning:   POD #2 s/p R AKA  Dsg off tomorrow   Subjective  - 2 Days Post-Op   C/o back pain   Objective   Vitals:   11/25/22 2011 11/26/22 0039 11/26/22 0413 11/26/22 0813  BP: 110/69 117/66 125/76 (!) 140/81  Pulse: 83 85 81 81  Resp: '18 16 18 18  '$ Temp: 98.2 F (36.8 C) 98.3 F (36.8 C) 98.4 F (36.9 C) 98.2 F (36.8 C)  TempSrc: Oral Oral Oral   SpO2: 100% 99% 100% 100%  Weight:      Height:         Intake/Output Summary (Last 24 hours) at 11/26/2022 0853 Last data filed at 11/26/2022 0843 Gross per 24 hour  Intake 1416.87 ml  Output 650 ml  Net 766.87 ml    VASC R AKA bandaged: no active bleeding on dsg    Laboratory   CBC    Latest Ref Rng & Units 11/26/2022    4:51 AM 11/25/2022    5:11 AM 11/24/2022   12:32 PM  CBC  WBC 4.0 - 10.5 K/uL 11.3  11.9  10.5   Hemoglobin 13.0 - 17.0 g/dL 6.8  8.2  6.6   Hematocrit 39.0 - 52.0 % 20.7  25.0  20.9   Platelets 150 - 400 K/uL 223  230  248     BMET    Component Value Date/Time   NA 135 11/26/2022 0451   NA 141 09/06/2022 1417   K 3.7 11/26/2022 0451   CL 105 11/26/2022 0451   CO2 27 11/26/2022 0451   GLUCOSE 237 (H) 11/26/2022 0451   BUN 24 (H) 11/26/2022 0451   BUN 25 (H) 09/06/2022 1417   CREATININE 0.88 11/26/2022 0451   CREATININE 0.79 11/09/2021 0902   CALCIUM 8.4 (L) 11/26/2022 0451   GFRNONAA >60 11/26/2022 0451   GFRNONAA 83 02/09/2021 1135   GFRAA 96 02/09/2021 1135     Adele Barthel, MD, FACS, FSVS Covering for [facility]: [phone]  11/26/2022, 8:53 AM

## 2022-11-26 NOTE — Evaluation (Signed)
Physical Therapy Evaluation Patient Details Name: [name] MRN: [id number] DOB: April 08, 1966 Today's Date: 11/26/2022  History of Present Illness  Pt is a 56 year old male s/p R BKA 11/22/22,  AKA 11/24/22; PMH significant for non-insulin-dependent diabetes mellitus, hypertension, hyperlipidemia  Clinical Impression  MD cleared pt for participation in setting of low Hgb.   Pt seen for PT evaluation with pt agreeable. Prior to admission pt was independent without AD. On this date, pt is AxOx4 but does present with decreased awareness & delayed responses. Pt received in recliner so pt performed STS from low chair with max assist with cuing for hand placement, sequencing, and assistance to power up to standing. Pt tolerates standing x 3 minutes with BUE or 1UE support with min assist for standing balance. BP afterwards in LUE 143/81 mmHg MAP 99, HR 88-90 bpm. Pt would benefit from CIR level of therapies upon d/c from acute setting to maximize independence with functional mobility.   Recommendations for follow up therapy are one component of a multi-disciplinary discharge planning process, led by the attending physician.  Recommendations may be updated based on patient status, additional functional criteria and insurance authorization.  Follow Up Recommendations Acute inpatient rehab (3hours/day)      Assistance Recommended at Discharge Frequent or constant Supervision/Assistance  Patient can return home with the following  A lot of help with bathing/dressing/bathroom;Two people to help with walking and/or transfers;Help with stairs or ramp for entrance;Assistance with cooking/housework;Assist for transportation    Equipment Recommendations  (TBD in next venue)  Recommendations for Other Services       Functional Status Assessment Patient has had a recent decline in their functional status and demonstrates the ability to make significant improvements in function in a reasonable and  predictable amount of time.     Precautions / Restrictions Precautions Precautions: Fall Precaution Comments: R AKA Restrictions Weight Bearing Restrictions: No      Mobility  Bed Mobility               General bed mobility comments: not tested, pt received & left sitting in recliner    Transfers Overall transfer level: Needs assistance Equipment used: Rolling walker (2 wheels) Transfers: Sit to/from Stand Sit to Stand: Max assist           General transfer comment: max cuing for sequencing & to power up to standing from low recliner chair, extra time to transfer BUE from armrests to RW    Ambulation/Gait Ambulation/Gait assistance:  (not safe without +2 assist)                Stairs            Wheelchair Mobility    Modified Rankin (Stroke Patients Only)       Balance Overall balance assessment: Needs assistance Sitting-balance support: Feet supported Sitting balance-Leahy Scale: Good     Standing balance support: Bilateral upper extremity supported, During functional activity, Reliant on assistive device for balance Standing balance-Leahy Scale: Fair                               Pertinent Vitals/Pain Pain Assessment Pain Assessment: No/denies pain    Home Living Family/patient expects to be discharged to:: Private residence Living Arrangements: Spouse/significant other Available Help at Discharge: Family;Available PRN/intermittently;Available 24 hours/day (wife says initial 24/7) Type of Home: House Home Access: Stairs to enter Entrance Stairs-Rails: Right;Left;Can reach both Entrance Stairs-Number of Steps:  3   Home Layout: One level Home Equipment: Conservation officer, nature (2 wheels)      Prior Function Prior Level of Function : Independent/Modified Independent             Mobility Comments: amb without AD ADLs Comments: MOD I with ADL/IADL, rarely drives     Hand Dominance        Extremity/Trunk Assessment    Upper Extremity Assessment Upper Extremity Assessment: Overall WFL for tasks assessed    Lower Extremity Assessment Lower Extremity Assessment: Generalized weakness RLE Deficits / Details: new RKA, clear yellow wrapping on it       Communication   Communication: No difficulties  Cognition Arousal/Alertness: Awake/alert Behavior During Therapy: Flat affect Overall Cognitive Status: Impaired/Different from baseline Area of Impairment: Awareness, Problem solving, Following commands                       Following Commands: Follows one step commands with increased time   Awareness: Emergent Problem Solving: Slow processing          General Comments      Exercises Other Exercises Other Exercises: PT educated pt on desensitization techniques & need to perform them throughout the day as upon questioning pt does report phantom sensation & pain. Educated him on f/u therapy recommendations.   Assessment/Plan    PT Assessment Patient needs continued PT services  PT Problem List Decreased strength;Impaired sensation;Decreased activity tolerance;Decreased knowledge of use of DME;Decreased balance;Decreased safety awareness;Decreased mobility;Decreased knowledge of precautions;Decreased skin integrity       PT Treatment Interventions DME instruction;Therapeutic exercise;Gait training;Balance training;Wheelchair mobility training;Stair training;Neuromuscular re-education;Functional mobility training;Therapeutic activities;Patient/family education;Modalities    PT Goals (Current goals can be found in the Care Plan section)  Acute Rehab PT Goals Patient Stated Goal: get better PT Goal Formulation: With patient Time For Goal Achievement: 12/10/22 Potential to Achieve Goals: Good    Frequency 7X/week     Co-evaluation               AM-PAC PT "6 Clicks" Mobility  Outcome Measure Help needed turning from your back to your side while in a flat bed without using  bedrails?: A Little Help needed moving from lying on your back to sitting on the side of a flat bed without using bedrails?: A Little Help needed moving to and from a bed to a chair (including a wheelchair)?: A Lot Help needed standing up from a chair using your arms (e.g., wheelchair or bedside chair)?: Total Help needed to walk in hospital room?: Total Help needed climbing 3-5 steps with a railing? : Total 6 Click Score: 11    End of Session Equipment Utilized During Treatment: Gait belt Activity Tolerance: Patient tolerated treatment well Patient left: in chair;with chair alarm set;with call bell/phone within reach Nurse Communication: Mobility status PT Visit Diagnosis: Other abnormalities of gait and mobility (R26.89);Difficulty in walking, not elsewhere classified (R26.2);Muscle weakness (generalized) (M62.81)    Time: 1040-1053 PT Time Calculation (min) (ACUTE ONLY): 13 min   Charges:   PT Evaluation $PT Eval Moderate Complexity: [place], PT, DPT 11/26/22, 12:05 PM   Waunita Schooner 11/26/2022, 12:02 PM

## 2022-11-26 NOTE — TOC Progression Note (Signed)
Transition of Care [facility]) - Progression Note    Patient Details  Name: [name] MRN: [id number] Date of Birth: 01/21/1966  Transition of Care [facility]) CM/SW [place], LCSW Phone Number: 11/26/2022, 4:13 PM  Clinical Narrative:    Reached out to CIR Representative about CIR rec.   Expected Discharge Plan: IP Rehab Facility Barriers to Discharge: Continued Medical Work up  Expected Discharge Plan and Services Expected Discharge Plan: [facility]       Living arrangements for the past 2 months: Single Family Home                                       Social Determinants of Health (SDOH) Interventions Food Insecurity Interventions: Inpatient TOC Utilities Interventions: Inpatient TOC  Readmission Risk Interventions     No data to display

## 2022-11-26 NOTE — Progress Notes (Signed)
PROGRESS NOTE    [name]  VQQ:[id number] DOB: [birth year] DOA: 11/21/2022 PCP: [name], MD    Brief Narrative:  56 year old gentleman with history of insulin-dependent diabetes, hypertension, hyperlipidemia presented to the emergency department with 4 days of feeling poor, weakness and loss of appetite, draining wound on the right fifth toe amputation site.  In the emergency room initially hemodynamically stable.  Was diagnosed with necrotizing fasciitis and underwent emergent amputation right BKA, revision right above-knee amputation 12/15.   Assessment & Plan:   Necrotizing fasciitis with gas gangrene: 12/13, right transtibial amputation with wound VAC placement.  Spreading infection. 12/15, right above-knee amputation and closure.  Pain controlled. Surgical cultures from leg with E. Coli and Streptococcus. Blood cultures negative so far. With amputation to clean margin and negative blood cultures, discontinue all antibiotics today.  Adequate pain management.  Start working with PT OT today.  Type 2 diabetes, uncontrolled with hyperglycemia: Blood sugars more than 200.  At home patient is on Jardiance, glipizide and Rybelsus.  Currently on sliding scale insulin.  Added long-acting insulin.  A1c pending.  Essential hypertension: Blood pressure stable.  On amlodipine.  Junctional rhythm with prolonged QR: Currently sinus rhythm with first-degree AV block.  Perioperative noted junctional rhythm.  Improved.  Electrolyte replacement.  Normal sinus rhythm now.  Electrolytes: Hypokalemia, adequate. Hypomagnesemia, adequate. Hypophosphatemia, keep on scheduled replacement.  Anemia of acute blood loss and dilutional anemia: Hemoglobin 10-8-6.6-1 unit of PRBC-6.8. 1 further unit of transfusion today.  Patient consented.   DVT prophylaxis: heparin injection 5,000 Units Start: 11/22/22 2200 Place TED hose Start: 11/21/22 2120   Code Status: Full code Family  Communication: None today. Disposition Plan: Status is: Inpatient Remains inpatient appropriate because: Surgical interventions, rehab.     Consultants:  Vascular surgery  Procedures:  Right BKA 12/13 Right AKA 12/15  Antimicrobials:  Cefepime and Flagyl 12/13--- 12/17.   Subjective:  Patient seen in the morning rounds.  He was in the chair and needed 2 people to get him out of the bed.  He is more interactive today.  Pain is controlled.  Afebrile. Vascular surgery planning to inspect wound tomorrow. Can go to rehab since then.  Objective: Vitals:   11/25/22 2011 11/26/22 0039 11/26/22 0413 11/26/22 0813  BP: 110/69 117/66 125/76 (!) 140/81  Pulse: 83 85 81 81  Resp: '18 16 18 18  '$ Temp: 98.2 F (36.8 C) 98.3 F (36.8 C) 98.4 F (36.9 C) 98.2 F (36.8 C)  TempSrc: Oral Oral Oral   SpO2: 100% 99% 100% 100%  Weight:      Height:        Intake/Output Summary (Last 24 hours) at 11/26/2022 1034 Last data filed at 11/26/2022 0843 Gross per 24 hour  Intake 1416.87 ml  Output 650 ml  Net 766.87 ml   Filed Weights   11/21/22 1743 11/22/22 0447 11/24/22 0847  Weight: 85.7 kg 75.3 kg 75.3 kg    Examination:  General exam: Appears calm and comfortable.  Pleasant to conversation today.  Sitting in chair. Respiratory system: Clear to auscultation. Respiratory effort normal. Cardiovascular system: S1 & S2 heard, RRR.  Gastrointestinal system: Abdomen is nondistended, soft and nontender. No organomegaly or masses felt. Normal bowel sounds heard. Central nervous system: Alert and oriented. No focal neurological deficits. Extremities: Symmetric 5 x 5 power. Right above-knee amputation, postop compression dressing.     Data Reviewed: I have personally reviewed following labs and imaging studies  CBC: Recent Labs  Lab 11/22/22  4174 11/23/22 0823 11/24/22 1232 11/25/22 0511 11/26/22 0451  WBC 18.7* 13.3* 10.5 11.9* 11.3*  NEUTROABS  --  11.6* 9.5* 10.0* 9.3*  HGB  8.6* 7.0* 6.6* 8.2* 6.8*  HCT 26.7* 22.5* 20.9* 25.0* 20.7*  MCV 89.3 88.9 89.3 87.4 87.0  PLT 317 265 248 230 081   Basic Metabolic Panel: Recent Labs  Lab 11/22/22 0459 11/23/22 0823 11/24/22 1232 11/25/22 0511 11/26/22 0451  NA 137 140 140 138 135  K 2.8* 3.7 3.5 3.3* 3.7  CL 103 111 109 111 105  CO2 14* 19* 19* 24 27  GLUCOSE 205* 172* 245* 164* 237*  BUN 45* 32* 26* 22* 24*  CREATININE 1.39* 1.06 0.92 0.81 0.88  CALCIUM 9.0 9.0 8.6* 8.8* 8.4*  MG 2.2  --  1.8 2.3 2.1  PHOS 3.6  --  2.0* 2.2* 1.8*   GFR: Estimated Creatinine Clearance: 101 mL/min (by C-G formula based on SCr of 0.88 mg/dL). Liver Function Tests: Recent Labs  Lab 11/24/22 1232 11/25/22 0511  AST 14* 19  ALT 8 9  ALKPHOS 59 62  BILITOT 1.3* 1.2  PROT 5.5* 5.8*  ALBUMIN <1.5* 1.7*   No results for input(s): "LIPASE", "AMYLASE" in the last 168 hours. No results for input(s): "AMMONIA" in the last 168 hours. Coagulation Profile: No results for input(s): "INR", "PROTIME" in the last 168 hours. Cardiac Enzymes: No results for input(s): "CKTOTAL", "CKMB", "CKMBINDEX", "TROPONINI" in the last 168 hours. BNP (last 3 results) No results for input(s): "PROBNP" in the last 8760 hours. HbA1C: No results for input(s): "HGBA1C" in the last 72 hours. CBG: Recent Labs  Lab 11/25/22 0850 11/25/22 1241 11/25/22 1709 11/25/22 2052 11/26/22 0812  GLUCAP 165* 253* 232* 245* 187*   Lipid Profile: No results for input(s): "CHOL", "HDL", "LDLCALC", "TRIG", "CHOLHDL", "LDLDIRECT" in the last 72 hours. Thyroid Function Tests: No results for input(s): "TSH", "T4TOTAL", "FREET4", "T3FREE", "THYROIDAB" in the last 72 hours. Anemia Panel: No results for input(s): "VITAMINB12", "FOLATE", "FERRITIN", "TIBC", "IRON", "RETICCTPCT" in the last 72 hours. Sepsis Labs: Recent Labs  Lab 11/21/22 1753  LATICACIDVEN 1.9    Recent Results (from the past 240 hour(s))  Culture, blood (Routine X 2) w Reflex to ID Panel      Status: None (Preliminary result)   Collection Time: 11/21/22  9:26 PM   Specimen: BLOOD  Result Value Ref Range Status   Specimen Description BLOOD LEFT HAND  Final   Special Requests   Final    BOTTLES DRAWN AEROBIC AND ANAEROBIC Blood Culture results may not be optimal due to an inadequate volume of blood received in culture bottles   Culture   Final    NO GROWTH 4 DAYS Performed at [facility], [address], [place], [place] [address]    Report Status PENDING  Incomplete  Culture, blood (Routine X 2) w Reflex to ID Panel     Status: None (Preliminary result)   Collection Time: 11/21/22  9:47 PM   Specimen: BLOOD  Result Value Ref Range Status   Specimen Description BLOOD RIGHT ASSIST CONTROL  Final   Special Requests   Final    BOTTLES DRAWN AEROBIC AND ANAEROBIC Blood Culture adequate volume   Culture   Final    NO GROWTH 4 DAYS Performed at [facility], [address] [place], [place] [address]    Report Status PENDING  Incomplete  Aerobic/Anaerobic Culture w Gram Stain (surgical/deep wound)     Status: None (Preliminary result)  Collection Time: 11/22/22  1:38 AM   Specimen: PATH Other; Tissue  Result Value Ref Range Status   Specimen Description   Final    WOUND RIGHT LEG Performed at [facility], [address] [place], [place] [address]    Special Requests   Final    NONE Performed at [facility], [place],  [address]    Gram Stain   Final    NO WBC SEEN ABUNDANT GRAM POSITIVE COCCI IN PAIRS RARE GRAM POSITIVE COCCI IN CHAINS Performed at [facility], [place] [address], [place],  [address]    Culture   Final    RARE ESCHERICHIA COLI ABUNDANT GROUP B STREP(S.AGALACTIAE)ISOLATED TESTING AGAINST S. AGALACTIAE NOT ROUTINELY PERFORMED DUE TO PREDICTABILITY OF AMP/PEN/VAN SUSCEPTIBILITY. NO ANAEROBES ISOLATED; CULTURE IN PROGRESS FOR 5 DAYS    Report Status PENDING  Incomplete    Organism ID, Bacteria ESCHERICHIA COLI  Final      Susceptibility   Escherichia coli - MIC*    AMPICILLIN >=32 RESISTANT Resistant     CEFAZOLIN <=4 SENSITIVE Sensitive     CEFEPIME <=0.12 SENSITIVE Sensitive     CEFTAZIDIME <=1 SENSITIVE Sensitive     CEFTRIAXONE <=0.25 SENSITIVE Sensitive     CIPROFLOXACIN <=0.25 SENSITIVE Sensitive     GENTAMICIN <=1 SENSITIVE Sensitive     IMIPENEM <=0.25 SENSITIVE Sensitive     TRIMETH/SULFA >=320 RESISTANT Resistant     AMPICILLIN/SULBACTAM 16 INTERMEDIATE Intermediate     PIP/TAZO <=4 SENSITIVE Sensitive     * RARE ESCHERICHIA COLI         Radiology Studies: No results found.      Scheduled Meds:  sodium chloride   Intravenous Once   chlorhexidine  60 mL Topical Once   heparin  5,000 Units Subcutaneous Q8H   insulin aspart  0-5 Units Subcutaneous QHS   insulin aspart  0-9 Units Subcutaneous TID WC   insulin glargine-yfgn  10 Units Subcutaneous QHS   phosphorus  250 mg Oral TID   prednisoLONE acetate  1 drop Both Eyes Q4H   rosuvastatin  40 mg Oral QHS   Continuous Infusions:  sodium chloride       LOS: 5 days    Time spent: 35 minutes    Barb Merino, MD [facility] Pager [phone]

## 2022-11-27 ENCOUNTER — Encounter: Payer: Self-pay | Admitting: Cardiology

## 2022-11-27 DIAGNOSIS — A480 Gas gangrene: Secondary | ICD-10-CM | POA: Diagnosis not present

## 2022-11-27 LAB — CBC WITH DIFFERENTIAL/PLATELET
Abs Immature Granulocytes: 0.17 10*3/uL — ABNORMAL HIGH (ref 0.00–0.07)
Basophils Absolute: 0 10*3/uL (ref 0.0–0.1)
Basophils Relative: 0 %
Eosinophils Absolute: 0 10*3/uL (ref 0.0–0.5)
Eosinophils Relative: 0 %
HCT: 26.1 % — ABNORMAL LOW (ref 39.0–52.0)
Hemoglobin: 8.7 g/dL — ABNORMAL LOW (ref 13.0–17.0)
Immature Granulocytes: 1 %
Lymphocytes Relative: 9 %
Lymphs Abs: 1.2 10*3/uL (ref 0.7–4.0)
MCH: 28.9 pg (ref 26.0–34.0)
MCHC: 33.3 g/dL (ref 30.0–36.0)
MCV: 86.7 fL (ref 80.0–100.0)
Monocytes Absolute: 1 10*3/uL (ref 0.1–1.0)
Monocytes Relative: 7 %
Neutro Abs: 11 10*3/uL — ABNORMAL HIGH (ref 1.7–7.7)
Neutrophils Relative %: 83 %
Platelets: 226 10*3/uL (ref 150–400)
RBC: 3.01 MIL/uL — ABNORMAL LOW (ref 4.22–5.81)
RDW: 13.8 % (ref 11.5–15.5)
WBC: 13.4 10*3/uL — ABNORMAL HIGH (ref 4.0–10.5)
nRBC: 0 % (ref 0.0–0.2)

## 2022-11-27 LAB — BPAM RBC
Blood Product Expiration Date: 202401102359
Blood Product Expiration Date: 202401142359
ISSUE DATE / TIME: 202312151346
ISSUE DATE / TIME: 202312171708
Unit Type and Rh: 600
Unit Type and Rh: 6200

## 2022-11-27 LAB — BASIC METABOLIC PANEL
Anion gap: 6 (ref 5–15)
BUN: 23 mg/dL — ABNORMAL HIGH (ref 6–20)
CO2: 25 mmol/L (ref 22–32)
Calcium: 8.4 mg/dL — ABNORMAL LOW (ref 8.9–10.3)
Chloride: 104 mmol/L (ref 98–111)
Creatinine, Ser: 0.81 mg/dL (ref 0.61–1.24)
GFR, Estimated: >60 mL/min (ref >60)
Glucose, Bld: 205 mg/dL — ABNORMAL HIGH (ref 70–99)
Potassium: 3.7 mmol/L (ref 3.5–5.1)
Sodium: 135 mmol/L (ref 135–145)

## 2022-11-27 LAB — TYPE AND SCREEN
ABO/RH(D): A POS
Antibody Screen: NEGATIVE
Unit division: 0
Unit division: 0

## 2022-11-27 LAB — AEROBIC/ANAEROBIC CULTURE W GRAM STAIN (SURGICAL/DEEP WOUND): Gram Stain: NONE SEEN

## 2022-11-27 LAB — HEMOGLOBIN A1C
Hgb A1c MFr Bld: 7.3 % — ABNORMAL HIGH (ref 4.8–5.6)
Mean Plasma Glucose: 163 mg/dL

## 2022-11-27 LAB — GLUCOSE, CAPILLARY
Glucose-Capillary: 168 mg/dL — ABNORMAL HIGH (ref 70–99)
Glucose-Capillary: 249 mg/dL — ABNORMAL HIGH (ref 70–99)
Glucose-Capillary: 328 mg/dL — ABNORMAL HIGH (ref 70–99)
Glucose-Capillary: 250 mg/dL — ABNORMAL HIGH (ref 70–99)

## 2022-11-27 LAB — SURGICAL PATHOLOGY

## 2022-11-27 NOTE — Progress Notes (Signed)
[facility]  Daily Progress Note   Subjective  - POD # 3 S/P Right BKA with revision to Right Above the knee amputation.   56 year old gentleman with history of insulin-dependent diabetes, hypertension, hyperlipidemia presented to the emergency department with 4 days of feeling poor, weakness and loss of appetite, draining wound on the right fifth toe amputation site. In the emergency room initially hemodynamically stable. Was diagnosed with necrotizing fasciitis and underwent emergent amputation right BKA on 11/22/22, revision right above-knee amputation 11/24/22.   Patient found in bed resting comfortably this morning.  His mother is at the bedside.  No complications to note overnight.  Resting remains clean dry and intact.  Plan is to change dressing tomorrow.  Patient denies any pain at rest.  Patient states it is a little painful when he moves around or works with physical therapy.  Patient's mother alerted [place] today that the patient has a detached retina which was fixed prior to the surgery and therefore patient's eye doctor request no strenuous exercise.  Vascular surgery is in agreement with that plan.  Objective Vitals:   11/26/22 2003 11/26/22 2341 11/27/22 0327 11/27/22 0823  BP: 117/75 135/75 126/81 (!) 131/94  Pulse: 89 85 87 86  Resp: '20 19 17 20  '$ Temp: 98.6 F (37 C) 97.7 F (36.5 C) 98.9 F (37.2 C) (!) 97.5 F (36.4 C)  TempSrc: Oral Oral Oral Oral  SpO2: 100% 100% 100% 91%  Weight:      Height:        Intake/Output Summary (Last 24 hours) at 11/27/2022 1148 Last data filed at 11/27/2022 0900 Gross per 24 hour  Intake 810 ml  Output 300 ml  Net 510 ml    PULM  CTAB CV  RRR VASC  right above-the-knee amputation.  Complications to note.  Dressing is clean dry and intact.  Laboratory CBC    Component Value Date/Time   WBC 13.4 (H) 11/27/2022 0310   HGB 8.7 (L) 11/27/2022 0310   HGB 14.2 09/06/2022 1417   HCT 26.1 (L) 11/27/2022 0310    HCT 42.4 09/06/2022 1417   PLT 226 11/27/2022 0310   PLT 312 09/06/2022 1417    BMET    Component Value Date/Time   NA 135 11/27/2022 0310   NA 141 09/06/2022 1417   K 3.7 11/27/2022 0310   CL 104 11/27/2022 0310   CO2 25 11/27/2022 0310   GLUCOSE 205 (H) 11/27/2022 0310   BUN 23 (H) 11/27/2022 0310   BUN 25 (H) 09/06/2022 1417   CREATININE 0.81 11/27/2022 0310   CREATININE 0.79 11/09/2021 0902   CALCIUM 8.4 (L) 11/27/2022 0310   GFRNONAA >60 11/27/2022 0310   GFRNONAA 83 02/09/2021 1135   GFRAA 96 02/09/2021 1135    Assessment/Planning: POD #3 s/p Right Above the knee Amputation.   Patient is recovering and healing as expected.  Dressing to right lower extremity is clean dry and intact.  Plan is to change dressing tomorrow.  Patient will need to go to rehab post discharge from [facility]  11/27/2022, 11:48 AM

## 2022-11-27 NOTE — Assessment & Plan Note (Signed)
Current CBGs in the mid 200 ranges Continue Semglee Follow CBGs and provide SSI Hemoglobin A1c 7.3

## 2022-11-27 NOTE — Progress Notes (Signed)
  Inpatient Rehabilitation Admissions Coordinator   I spoke with patient and his wife by phone . Wife is at bedside.We discussed goals and expectations of  a possible CIR admit. They feel he is unable to do that level of therapy due to his current eye issues.They seem to prefer SNF level rehab. He does have 24/7 caregivers at home. I will update acute team and TOC. We will not pursue CIR level rehab at [facility] in [place] at this time. Please call me with any questions.   Danne Baxter, RN, MSN Rehab Admissions Coordinator [phone]

## 2022-11-27 NOTE — Inpatient Diabetes Management (Signed)
Inpatient Diabetes Program Recommendations  AACE/ADA: New Consensus Statement on Inpatient Glycemic Control (2015)  Target Ranges:  Prepandial:   less than 140 mg/dL      Peak postprandial:   less than 180 mg/dL (1-2 hours)      Critically ill patients:  140 - 180 mg/dL   Lab Results  Component Value Date   GLUCAP 249 (H) 11/27/2022   HGBA1C 7.3 (H) 11/25/2022    Inpatient Diabetes Program Recommendations:   Please consider while orals held in the hospital: -Add Novolog 2-3 units tid meal coverage if eats 50%  Thank you, Bethena Roys E. Kearsten Ginther, RN, MSN, CDE  Diabetes Coordinator Inpatient Glycemic Control Team Team Pager [phone] (8am-5pm) 11/27/2022 12:47 PM

## 2022-11-27 NOTE — Progress Notes (Signed)
Inpatient Rehabilitation Admissions Coordinator   Rehab consult received, I await therapy updates today now that Hgb 8.7 to assist in further discussions concerning rehab venue options I will follow up with patient today.  Danne Baxter, RN, MSN Rehab Admissions Coordinator [phone] 11/27/2022 8:35 AM\

## 2022-11-27 NOTE — Progress Notes (Signed)
Physical Therapy Treatment Patient Details Name: [name] MRN: [id number] DOB: 02/14/1966 Today's Date: 11/27/2022   History of Present Illness Pt is a 56 year old male s/p R BKA 11/22/22,  AKA 11/24/22; PMH significant for non-insulin-dependent diabetes mellitus, hypertension, hyperlipidemia    PT Comments    Pt received upright in recliner with mother in room. Pt adamant on declining participation in OOB mobility. Per pt he has a retina issue with his eye and he was told to avoid "strenuous" or exertive activity. Pt and spouse in touch with his eye specialist for updates on what pt can tolerate in order to improve safe participation and plans to update medical team when he gets answers. Pt educated on expectations with PT during acute admission and expectations for CIR. Educated pt and spouse if he is unable to participate with PT he will likely not be approved for CIR placement thus should consider other options for D/c to home or possible SNF placement. However will need to participate with PT here to ensure safe d/c plan. Pt understanding but still declining PT efforts until updates from eye specialist. Pt educated on desensitization techniques for phantom limb pains and sensation, prone lying for preventing hip flexor contracture for future prosthesis use, and AROM of R hip to maintain hip mobility and hip strength. Pt receptive and understanding of education and PT discussion. Pt wants to participate and go to CIR but is wary because of his eye issue. Will continue daily efforts and communicate with pt for updates from eye specialist and progress PT as able.     Recommendations for follow up therapy are one component of a multi-disciplinary discharge planning process, led by the attending physician.  Recommendations may be updated based on patient status, additional functional criteria and insurance authorization.  Follow Up Recommendations  Acute inpatient rehab (3hours/day) (May need  alternative d/c plan if unable to participate due to eye problem needing to avoid strenuous activity.)     Assistance Recommended at Discharge Frequent or constant Supervision/Assistance  Patient can return home with the following A lot of help with bathing/dressing/bathroom;Two people to help with walking and/or transfers;Help with stairs or ramp for entrance;Assistance with cooking/housework;Assist for transportation   Equipment Recommendations       Recommendations for Other Services       Precautions / Restrictions Precautions Precautions: Fall Precaution Comments: R AKA Restrictions Weight Bearing Restrictions: No     Mobility  Bed Mobility                    Transfers                        Ambulation/Gait                   Stairs             Wheelchair Mobility    Modified Rankin (Stroke Patients Only)       Balance                                            Cognition Arousal/Alertness: Awake/alert Behavior During Therapy: Flat affect, WFL for tasks assessed/performed Overall Cognitive Status: Within Functional Limits for tasks assessed  General Comments: Very talkative, cooperative. Receptive to PT education.        Exercises Other Exercises Other Exercises: Educated pt on desensitization techniques to residual limb, importance of prone lying to maintain adequate hip extension for future prosthesis use. Education on possible change in d/c rec if can not particiapte with PT due to healing retina issue of his eye. Education on gentle RLE AROM exercises of hip abduction and SLR.    General Comments        Pertinent Vitals/Pain      Home Living                          Prior Function            PT Goals (current goals can now be found in the care plan section) Acute Rehab PT Goals Patient Stated Goal: get better PT Goal Formulation:  With patient Time For Goal Achievement: 12/10/22 Potential to Achieve Goals: Good Progress towards PT goals: Not progressing toward goals - comment (declining participation)    Frequency    7X/week      PT Plan Current plan remains appropriate    Co-evaluation              AM-PAC PT "6 Clicks" Mobility   Outcome Measure  Help needed turning from your back to your side while in a flat bed without using bedrails?: A Little Help needed moving from lying on your back to sitting on the side of a flat bed without using bedrails?: A Little Help needed moving to and from a bed to a chair (including a wheelchair)?: A Lot Help needed standing up from a chair using your arms (e.g., wheelchair or bedside chair)?: Total Help needed to walk in hospital room?: Total Help needed climbing 3-5 steps with a railing? : Total 6 Click Score: 11    End of Session   Activity Tolerance: Other (comment) (declining OOB due to retina issue needing to heal.) Patient left: in bed Nurse Communication: Mobility status PT Visit Diagnosis: Other abnormalities of gait and mobility (R26.89);Difficulty in walking, not elsewhere classified (R26.2);Muscle weakness (generalized) (M62.81)     Time: 6808-8110 PT Time Calculation (min) (ACUTE ONLY): 12 min  Charges:  $Self Care/Home Management: [place]. Fairly IV, PT, DPT Physical Therapist- [facility]  11/27/2022, 10:46 AM

## 2022-11-27 NOTE — Progress Notes (Signed)
Progress Note   Patient: [name] CHE:[id number] DOB: Sep 11, 1966 DOA: 11/21/2022     6 DOS: the patient was seen and examined on 11/27/2022   Brief hospital course: Mr. [name] is a 56 year-old male with history of non-insulin-dependent diabetes mellitus, hypertension, hyperlipidemia, who presents to the emergency department for chief concerns of weakness and loss of appetite for 4 days.  Of note he had a right fifth toe amputation due to diabetes.  He endorses odor at the site.  Initial vitals in the ED showed temperature of 98.5, respiration rate of 18, heart rate of 99, blood pressure 125/81, SpO2 of 98% on room air.  Serum sodium is 137, potassium 2.6, chloride of 95, bicarb 15, BUN of 52, serum creatinine 1.69, EGFR 47, nonfasting blood glucose 215, WBC 16.9, hemoglobin 10.4, platelets of 416.  High sensitive troponin was 26.  Lactic acid was 1.9.    Sed rate elevated to 127.  ED treatment: Potassium chloride 40 mill equivalent p.o. one-time dose, potassium chloride 10 mill equivalent IV, 2 doses ordered, vancomycin, metronidazole, Zosyn.  EDP consulted podiatry who will see the patient.  Assessment and Plan: * Gas gangrene ([place]) Right lower extremity gas gangrene - General surgery has been consulted, Dr. Dahlia Byes is aware - Status post right above-the-knee amputation -Patient is doing well clinically.  Per patient and wife they do not feel he would be able to participate in CIR level of rehabilitative therapies therefore plan is to discharge to SNF for additional rehab -No longer requiring antibiotics -Continue both oral and IV narcotics as well as nonnarcotic medications for pain  Hypokalemia - Resolved  Elevated troponin - Secondary to demand ischemia  Soft tissue infection of foot See documentation under gas gangrene  AKI (acute kidney injury) ([place]) - Prerenal etiology -Current BUN 32 with a creatinine of 1.06  HLD (hyperlipidemia) - Resumed home  rosuvastatin 40 mg nightly  Hyperglycemia due to type 2 diabetes mellitus ([place]) Current CBGs in the mid 200 ranges Continue Semglee Follow CBGs and provide SSI Hemoglobin A1c 7.3  Hypertension - Hold home amlodipine as well as valsartan HCTZ especially in light of hypoperfusion and recent AKI - Hydralazine 5 mg IV every 6 hours as needed for hypertension          Subjective: Sitting up in chair looking at phone without any specific complaints.  Physical Exam: Vitals:   11/27/22 0327 11/27/22 0823 11/27/22 1214 11/27/22 1542  BP: 126/81 (!) 131/94 122/76 (!) 102/52  Pulse: 87 86 86 79  Resp: _0 Temp: 98.9 F (37.2 C) (!) 97.5 F (36.4 C) 98.7 F (37.1 C) 98.2 F (36.8 C)  TempSrc: Oral Oral Oral Oral  SpO2: 100% 91% 100% 100%  Weight:      Height:       Constitutional: NAD, calm, comfortable Respiratory: clear to auscultation bilaterally, no wheezing, no crackles. Normal respiratory effort. No accessory muscle use.  Cardiovascular: Regular rate and rhythm, no murmurs / rubs / gallops. No extremity edema. 2+ pedal pulses.   Abdomen: no tenderness, no masses palpated. Bowel sounds positive.  Musculoskeletal: Status post right AKA.  Dressing clean dry and intact.  No issues with left lower extremity Skin: no rashes, lesions, ulcers. No induration Neurologic: CN 2-12 grossly intact. Sensation intact, DTR normal. Strength 5/5 x all 4 extremities.  Psychiatric: Normal judgment and insight. Alert and oriented x 3. Normal mood.   Data Reviewed:    Family Communication: Mother at bedside  Disposition: Status is: Inpatient Remains inpatient appropriate because: Waiting on safe discharge plan  Planned Discharge Destination: Skilled nursing facility    Time spent: 40 minutes  Author: Erin Hearing, NP 11/27/2022 6:07 PM  For on call review [email or web address].

## 2022-11-28 ENCOUNTER — Ambulatory Visit: Payer: Managed Care, Other (non HMO) | Admitting: Physician Assistant

## 2022-11-28 DIAGNOSIS — A480 Gas gangrene: Secondary | ICD-10-CM | POA: Diagnosis not present

## 2022-11-28 LAB — GLUCOSE, CAPILLARY
Glucose-Capillary: 142 mg/dL — ABNORMAL HIGH (ref 70–99)
Glucose-Capillary: 140 mg/dL — ABNORMAL HIGH (ref 70–99)
Glucose-Capillary: 315 mg/dL — ABNORMAL HIGH (ref 70–99)
Glucose-Capillary: 323 mg/dL — ABNORMAL HIGH (ref 70–99)

## 2022-11-28 LAB — BASIC METABOLIC PANEL
Anion gap: 3 — ABNORMAL LOW (ref 5–15)
BUN: 22 mg/dL — ABNORMAL HIGH (ref 6–20)
CO2: 27 mmol/L (ref 22–32)
Calcium: 8.1 mg/dL — ABNORMAL LOW (ref 8.9–10.3)
Chloride: 108 mmol/L (ref 98–111)
Creatinine, Ser: 0.69 mg/dL (ref 0.61–1.24)
GFR, Estimated: >60 mL/min (ref >60)
Glucose, Bld: 133 mg/dL — ABNORMAL HIGH (ref 70–99)
Potassium: 3.8 mmol/L (ref 3.5–5.1)
Sodium: 138 mmol/L (ref 135–145)

## 2022-11-28 LAB — PHOSPHORUS: Phosphorus: 2.4 mg/dL — ABNORMAL LOW (ref 2.5–4.6)

## 2022-11-28 MED ORDER — SENNOSIDES-DOCUSATE SODIUM 8.6-50 MG PO TABS: 1.0000 | ORAL_TABLET | Freq: Every evening | ORAL | Status: DC | PRN

## 2022-11-28 MED ORDER — HYDRALAZINE HCL 20 MG/ML IJ SOLN: 10.0000 mg | INTRAMUSCULAR | Status: DC | PRN

## 2022-11-28 MED ORDER — POLYETHYLENE GLYCOL 3350 17 G PO PACK
17.0000 g | PACK | Freq: Two times a day (BID) | ORAL | Status: DC
  Administered 2022-11-29 – 2022-11-30 (×3): 17 g via ORAL
  Filled 2022-11-28 (×4): qty 1

## 2022-11-28 MED ORDER — METOPROLOL TARTRATE 5 MG/5ML IV SOLN: 5.0000 mg | INTRAVENOUS | Status: DC | PRN

## 2022-11-28 MED ORDER — GUAIFENESIN 100 MG/5ML PO LIQD: 5.0000 mL | ORAL | Status: DC | PRN

## 2022-11-28 MED ORDER — IPRATROPIUM-ALBUTEROL 0.5-2.5 (3) MG/3ML IN SOLN: 3.0000 mL | RESPIRATORY_TRACT | Status: DC | PRN

## 2022-11-28 NOTE — Progress Notes (Signed)
    Durable Medical Equipment  (From admission, onward)           Start     Ordered   11/28/22 0958  For home use only DME 3 n 1  Once        11/28/22 0957           Patient is not able to walk the distance required to go the bathroom, or he/she is unable to safely negotiate stairs required to access the bathroom.  A 3in1 BSC will alleviate this problem  Kelby Fam, [place], MSW, [place]

## 2022-11-28 NOTE — Plan of Care (Signed)

## 2022-11-28 NOTE — Progress Notes (Signed)
PROGRESS NOTE    [name]  OAC:[id number] DOB: [birth year] DOA: 11/21/2022 PCP: [name], MD   Brief Narrative:  56 year-old male with history of non-insulin-dependent diabetes mellitus, hypertension, hyperlipidemia, who presents to the emergency department for chief concerns of weakness and loss of appetite for 4 days. Of note he had a right fifth toe amputation due to diabetes.  He endorses odor at the site. Was diagnosed with necrotizing fasciitis and underwent emergent amputation right BKA, revision right above-knee amputation 12/15.    Assessment & Plan:  Principal Problem:   Gas gangrene ([place]) Active Problems:   Hypertension   Hyperglycemia due to type 2 diabetes mellitus ([place])   HLD (hyperlipidemia)   Diabetic ulcer of foot with fat layer exposed ([place])   PAD (peripheral artery disease) ([facility])   AKI (acute kidney injury) ([place])   B12 deficiency   Soft tissue infection of foot   Elevated troponin   Hypokalemia   Gas gangrene ([facility]) Right lower extremity gas gangrene - Status post right above-the-knee amputation -Patient is doing well clinically.  Per patient and wife they do not feel he would be able to participate in CIR level of rehabilitative therapies therefore plan is to discharge to SNF for additional rehab -No longer requiring antibiotics -Continue both oral and IV narcotics as well as nonnarcotic medications for pain  Severe constipation - Aggressive bowel regimen   Hypokalemia - Resolved   Elevated troponin - Secondary to demand ischemia   Soft tissue infection of foot See documentation under gas gangrene   AKI (acute kidney injury) ([place]); resolved.  Stable now. Cr now 0.69   HLD (hyperlipidemia) - Resumed home rosuvastatin 40 mg nightly   Hyperglycemia due to type 2 diabetes mellitus ([facility]) Current CBGs in the mid 200 ranges Continue Semglee 10U qhs Follow CBGs and provide SSI Hemoglobin A1c 7.3   Hypertension On Norvasc, Will add IV  PRn   PT/OT-CIR but SNF appears to be better option for him  DVT prophylaxis: SQ Heparin Code Status: Full  Family Communication: Spouse at bedside  Status is: Inpatient TOC working on SNF placement       Body mass index is 23.15 kg/m.  Pressure Injury 11/23/22 Buttocks Right Stage 2 -  Partial thickness loss of dermis presenting as a shallow open injury with a red, pink wound bed without slough. red (Active)  11/23/22 2000  Location: Buttocks  Location Orientation: Right  Staging: Stage 2 -  Partial thickness loss of dermis presenting as a shallow open injury with a red, pink wound bed without slough.  Wound Description (Comments): red  Present on Admission: -- (present on assessment)    Subjective: Patient seen and examined at bedside, does not have any complaints besides pain at the surgical site after being moved around this morning to help clean him.  He is also not had a bowel movement and a few days.   Examination:  General exam: Appears calm and comfortable  Respiratory system: Clear to auscultation. Respiratory effort normal. Cardiovascular system: S1 & S2 heard, RRR. No JVD, murmurs, rubs, gallops or clicks. No pedal edema. Gastrointestinal system: Abdomen is nondistended, soft and nontender. No organomegaly or masses felt. Normal bowel sounds heard. Central nervous system: Alert and oriented. No focal neurological deficits. Extremities: Symmetric 5 x 5 power. Skin: Right-sided amputation noted Psychiatry: Judgement and insight appear normal. Mood & affect appropriate.     Objective: Vitals:   11/28/22 0434 11/28/22 0521 11/28/22 0545 11/28/22 0806  BP:  137/85 (!) 155/85  132/76  Pulse: 76 88  78  Resp: '20  16 15  '$ Temp: 98.1 F (36.7 C)   98 F (36.7 C)  TempSrc: Oral     SpO2: 100% 100%  100%  Weight:      Height:        Intake/Output Summary (Last 24 hours) at 11/28/2022 0916 Last data filed at 11/28/2022 0254 Gross per 24 hour  Intake 240 ml   Output 650 ml  Net -410 ml   Filed Weights   11/21/22 1743 11/22/22 0447 11/24/22 0847  Weight: 85.7 kg 75.3 kg 75.3 kg     Data Reviewed:   CBC: Recent Labs  Lab 11/23/22 0823 11/24/22 1232 11/25/22 0511 11/26/22 0451 11/27/22 0310  WBC 13.3* 10.5 11.9* 11.3* 13.4*  NEUTROABS 11.6* 9.5* 10.0* 9.3* 11.0*  HGB 7.0* 6.6* 8.2* 6.8* 8.7*  HCT 22.5* 20.9* 25.0* 20.7* 26.1*  MCV 88.9 89.3 87.4 87.0 86.7  PLT 265 248 230 223 270   Basic Metabolic Panel: Recent Labs  Lab 11/22/22 0459 11/23/22 0823 11/24/22 1232 11/25/22 0511 11/26/22 0451 11/27/22 0310 11/28/22 0449  NA 137   < > 140 138 135 135 138  K 2.8*   < > 3.5 3.3* 3.7 3.7 3.8  CL 103   < > 109 111 105 104 108  CO2 14*   < > 19* '24 27 25 27  '$ GLUCOSE 205*   < > 245* 164* 237* 205* 133*  BUN 45*   < > 26* 22* 24* 23* 22*  CREATININE 1.39*   < > 0.92 0.81 0.88 0.81 0.69  CALCIUM 9.0   < > 8.6* 8.8* 8.4* 8.4* 8.1*  MG 2.2  --  1.8 2.3 2.1  --   --   PHOS 3.6  --  2.0* 2.2* 1.8*  --  2.4*   < > = values in this interval not displayed.   GFR: Estimated Creatinine Clearance: 111.1 mL/min (by C-G formula based on SCr of 0.69 mg/dL). Liver Function Tests: Recent Labs  Lab 11/24/22 1232 11/25/22 0511  AST 14* 19  ALT 8 9  ALKPHOS 59 62  BILITOT 1.3* 1.2  PROT 5.5* 5.8*  ALBUMIN <1.5* 1.7*   No results for input(s): "LIPASE", "AMYLASE" in the last 168 hours. No results for input(s): "AMMONIA" in the last 168 hours. Coagulation Profile: No results for input(s): "INR", "PROTIME" in the last 168 hours. Cardiac Enzymes: No results for input(s): "CKTOTAL", "CKMB", "CKMBINDEX", "TROPONINI" in the last 168 hours. BNP (last 3 results) No results for input(s): "PROBNP" in the last 8760 hours. HbA1C: No results for input(s): "HGBA1C" in the last 72 hours. CBG: Recent Labs  Lab 11/27/22 0819 11/27/22 1209 11/27/22 1539 11/27/22 2045 11/28/22 0807  GLUCAP 168* 249* 328* 250* 142*   Lipid Profile: No  results for input(s): "CHOL", "HDL", "LDLCALC", "TRIG", "CHOLHDL", "LDLDIRECT" in the last 72 hours. Thyroid Function Tests: No results for input(s): "TSH", "T4TOTAL", "FREET4", "T3FREE", "THYROIDAB" in the last 72 hours. Anemia Panel: No results for input(s): "VITAMINB12", "FOLATE", "FERRITIN", "TIBC", "IRON", "RETICCTPCT" in the last 72 hours. Sepsis Labs: Recent Labs  Lab 11/21/22 1753  LATICACIDVEN 1.9    Recent Results (from the past 240 hour(s))  Culture, blood (Routine X 2) w Reflex to ID Panel     Status: None   Collection Time: 11/21/22  9:26 PM   Specimen: BLOOD  Result Value Ref Range Status   Specimen Description BLOOD LEFT HAND  Final  Special Requests   Final    BOTTLES DRAWN AEROBIC AND ANAEROBIC Blood Culture results may not be optimal due to an inadequate volume of blood received in culture bottles   Culture   Final    NO GROWTH 5 DAYS Performed at [facility], [address] [place], [place] [address]    Report Status 11/26/2022 FINAL  Final  Culture, blood (Routine X 2) w Reflex to ID Panel     Status: None   Collection Time: 11/21/22  9:47 PM   Specimen: BLOOD  Result Value Ref Range Status   Specimen Description BLOOD RIGHT ASSIST CONTROL  Final   Special Requests   Final    BOTTLES DRAWN AEROBIC AND ANAEROBIC Blood Culture adequate volume   Culture   Final    NO GROWTH 5 DAYS Performed at [facility], [address] [place], [place] [address]    Report Status 11/26/2022 FINAL  Final  Aerobic/Anaerobic Culture w Gram Stain (surgical/deep wound)     Status: None   Collection Time: 11/22/22  1:38 AM   Specimen: PATH Other; Tissue  Result Value Ref Range Status   Specimen Description   Final    WOUND RIGHT LEG Performed at [facility], [address], [place], [place] [address]    Special Requests   Final    NONE Performed at [facility], [address] [place], [place] [address]    Gram Stain   Final     NO WBC SEEN ABUNDANT GRAM POSITIVE COCCI IN PAIRS RARE GRAM POSITIVE COCCI IN CHAINS Performed at [facility], [place] [address] [place], [place] [address]    Culture   Final    RARE ESCHERICHIA COLI ABUNDANT GROUP B STREP(S.AGALACTIAE)ISOLATED TESTING AGAINST S. AGALACTIAE NOT ROUTINELY PERFORMED DUE TO PREDICTABILITY OF AMP/PEN/VAN SUSCEPTIBILITY. MIXED ANAEROBIC FLORA PRESENT.  CALL LAB IF FURTHER IID REQUIRED.    Report Status 11/27/2022 FINAL  Final   Organism ID, Bacteria ESCHERICHIA COLI  Final      Susceptibility   Escherichia coli - MIC*    AMPICILLIN >=32 RESISTANT Resistant     CEFAZOLIN <=4 SENSITIVE Sensitive     CEFEPIME <=0.12 SENSITIVE Sensitive     CEFTAZIDIME <=1 SENSITIVE Sensitive     CEFTRIAXONE <=0.25 SENSITIVE Sensitive     CIPROFLOXACIN <=0.25 SENSITIVE Sensitive     GENTAMICIN <=1 SENSITIVE Sensitive     IMIPENEM <=0.25 SENSITIVE Sensitive     TRIMETH/SULFA >=320 RESISTANT Resistant     AMPICILLIN/SULBACTAM 16 INTERMEDIATE Intermediate     PIP/TAZO <=4 SENSITIVE Sensitive     * RARE ESCHERICHIA COLI         Radiology Studies: No results found.      Scheduled Meds:  chlorhexidine  60 mL Topical Once   heparin  5,000 Units Subcutaneous Q8H   insulin aspart  0-5 Units Subcutaneous QHS   insulin aspart  0-9 Units Subcutaneous TID WC   insulin glargine-yfgn  10 Units Subcutaneous QHS   phosphorus  250 mg Oral TID   prednisoLONE acetate  1 drop Both Eyes Q4H   rosuvastatin  40 mg Oral QHS   Continuous Infusions:  sodium chloride       LOS: 7 days   Time spent= 35 mins    Yeshaya Vath Arsenio Loader, MD [facility]  If 7PM-7AM, please contact night-coverage  11/28/2022, 9:16 AM

## 2022-11-28 NOTE — Progress Notes (Signed)
[facility]  Daily Progress Note   Subjective  -   Patient endorses continued to have some discomfort in his right above-knee amputation, but beginning to be more tolerable.  Objective Vitals:   11/28/22 0521 11/28/22 0545 11/28/22 0806 11/28/22 1157  BP: (!) 155/85  132/76 128/74  Pulse: 88  78 83  Resp:  '16 15 20  '$ Temp:   98 F (36.7 C) 98.3 F (36.8 C)  TempSrc:      SpO2: 100%  100% 100%  Weight:      Height:        Intake/Output Summary (Last 24 hours) at 11/28/2022 1439 Last data filed at 11/28/2022 1047 Gross per 24 hour  Intake --  Output 1450 ml  Net -1450 ml    PULM  CTAB CV  RRR VASC  incision healing well, see photo    Laboratory CBC    Component Value Date/Time   WBC 13.4 (H) 11/27/2022 0310   HGB 8.7 (L) 11/27/2022 0310   HGB 14.2 09/06/2022 1417   HCT 26.1 (L) 11/27/2022 0310   HCT 42.4 09/06/2022 1417   PLT 226 11/27/2022 0310   PLT 312 09/06/2022 1417    BMET    Component Value Date/Time   NA 138 11/28/2022 0449   NA 141 09/06/2022 1417   K 3.8 11/28/2022 0449   CL 108 11/28/2022 0449   CO2 27 11/28/2022 0449   GLUCOSE 133 (H) 11/28/2022 0449   BUN 22 (H) 11/28/2022 0449   BUN 25 (H) 09/06/2022 1417   CREATININE 0.69 11/28/2022 0449   CREATININE 0.79 11/09/2021 0902   CALCIUM 8.1 (L) 11/28/2022 0449   GFRNONAA >60 11/28/2022 0449   GFRNONAA 83 02/09/2021 1135   GFRAA 96 02/09/2021 1135    Assessment/Planning: POD #3 s/p right above knee amputation   Postoperative dressing changed Incision is clean dry and intact.  Healing very well thus far.  See attached picture No further vascular intervention is recommended at this time, we will plan on seeing in office in 3 weeks  Kris Hartmann  11/28/2022, 2:39 PM

## 2022-11-28 NOTE — Progress Notes (Signed)
Occupational Therapy Treatment Patient Details Name: [name] MRN: [id number] DOB: 05/14/1966 Today's Date: 11/28/2022   History of present illness Pt is a 56 year old male s/p R BKA 11/22/22,  AKA 11/24/22; PMH significant for non-insulin-dependent diabetes mellitus, hypertension, hyperlipidemia   OT comments  Upon entering session, pt resting in bed. Pt deferred EOB/OOB mobility this date despite max encouragement. Pt agreeable to BUE and LLE AROM exercises while sitting up in bed (see details below). Discussed desensitization techniques for R residual limb. Per chart review, pt/family no longer pursuing CIR. Recommendations updated to STR 2/2 pt being high fall risk, decreased safety awareness, and current level of assistance required for ADLs/mobility. If pt decides to D/C home, he will need 24/7 supervision, assist for all ADLs/IADLs, and a drop arm BSC.   Recommendations for follow up therapy are one component of a multi-disciplinary discharge planning process, led by the attending physician.  Recommendations may be updated based on patient status, additional functional criteria and insurance authorization.    Follow Up Recommendations  Skilled nursing-short term rehab (<3 hours/day)     Assistance Recommended at Discharge Frequent or constant Supervision/Assistance  Patient can return home with the following  A lot of help with walking and/or transfers;A lot of help with bathing/dressing/bathroom;Assistance with cooking/housework;Assist for transportation;Help with stairs or ramp for entrance   Equipment Recommendations  BSC/3in1    Recommendations for Other Services      Precautions / Restrictions Precautions Precautions: Fall Precaution Comments: R AKA Restrictions Weight Bearing Restrictions: No       Mobility Bed Mobility Overal bed mobility: Needs Assistance             General bed mobility comments: pt deferred    Transfers                          Balance Overall balance assessment: Needs assistance     Sitting balance - Comments: pt deferred                                   ADL either performed or assessed with clinical judgement   ADL Overall ADL's : Needs assistance/impaired                                       General ADL Comments: pt deferred grooming tasks    Extremity/Trunk Assessment Upper Extremity Assessment Upper Extremity Assessment: Overall WFL for tasks assessed   Lower Extremity Assessment Lower Extremity Assessment: Generalized weakness        Vision Baseline Vision/History: 1 Wears glasses Patient Visual Report: No change from baseline Additional Comments: Pt reports having "retina issues" & a "gas bubble" in his L eye where he cannot perform strenuous activity, seeing eye specialist   Perception     Praxis      Cognition Arousal/Alertness: Awake/alert Behavior During Therapy: Flat affect, WFL for tasks assessed/performed Overall Cognitive Status: Within Functional Limits for tasks assessed                                          Exercises General Exercises - Upper Extremity Shoulder Flexion: AROM, Both, 10 reps, Seated Shoulder Horizontal ABduction: AROM, Both, 10 reps,  Seated Shoulder Horizontal ADduction: AROM, Both, 10 reps, Seated Elbow Flexion: AROM, Both, 10 reps, Seated Elbow Extension: AROM, Both, 10 reps, Seated General Exercises - Lower Extremity Ankle Circles/Pumps: AROM, Left, 10 reps, Supine Hip Flexion/Marching: AROM, Left, 10 reps, Supine Other Exercises Other Exercises: OT provided education re: desensitization techniques for R residual limb    Shoulder Instructions       General Comments      Pertinent Vitals/ Pain       Pain Assessment Pain Assessment: No/denies pain  Home Living                                          Prior Functioning/Environment              Frequency  Min  2X/week        Progress Toward Goals  OT Goals(current goals can now be found in the care plan section)  Progress towards OT goals: Progressing toward goals  Acute Rehab OT Goals Patient Stated Goal: return to PLOF OT Goal Formulation: With patient/family Time For Goal Achievement: 12/09/22 Potential to Achieve Goals: Good  Plan Discharge plan needs to be updated;Frequency needs to be updated    Co-evaluation                 AM-PAC OT "6 Clicks" Daily Activity     Outcome Measure   Help from another person eating meals?: None Help from another person taking care of personal grooming?: None Help from another person toileting, which includes using toliet, bedpan, or urinal?: A Lot Help from another person bathing (including washing, rinsing, drying)?: A Lot Help from another person to put on and taking off regular upper body clothing?: A Little Help from another person to put on and taking off regular lower body clothing?: A Lot 6 Click Score: 17    End of Session    OT Visit Diagnosis: Unsteadiness on feet (R26.81);Muscle weakness (generalized) (M62.81)   Activity Tolerance Patient tolerated treatment well   Patient Left with call bell/phone within reach;with bed alarm set;in bed   Nurse Communication Mobility status        Time: 1000-1017 OT Time Calculation (min): 17 min  Charges: OT General Charges $OT Visit: 1 Visit OT Treatments $Therapeutic Exercise: 8-22 mins  [facility] MS, OTR/L ascom [phone]  11/28/22, 1:55 PM

## 2022-11-28 NOTE — TOC Initial Note (Addendum)
Transition of Care [facility]) - Initial/Assessment Note    Patient Details  Name: [name] MRN: [id number] Date of Birth: 06-Jun-1966  Transition [facility] Care [facility]) CM/SW Contact:    Tiburcio Bash, LCSW Phone Number: 11/28/2022, 9:48 AM  Clinical Narrative:                  Update: 11:02 am: Patient spouse reports she was able to find 3in1, does not need new one.      CSW spoke with patient's spouse [name] regarding dc plan, as they have declined Cir at this time. She reports they wish not to pursue SNF and would prefer patient come home with Home Health.   She reports that they have around the clock care and plenty of family support. Reports being agreeable to [facility] PT OT and RN with no preference of agency. Reports they have a wheelchair and walker. They are requesting 3in1 to be shipped to house via [employer], this was ordered.   HH referral given to Meg with [facility] she is checking patient's plan.   Expected Discharge Plan: [place] Barriers to Discharge: Continued Medical Work up   Patient Goals and CMS Choice Patient states their goals for this hospitalization and ongoing recovery are:: to go home CMS Medicare.gov Compare Post Acute Care list provided to:: Patient Choice offered to / list presented to : Patient  Expected Discharge Plan and Services Expected Discharge Plan: [place]       Living arrangements for the past 2 months: Single Family Home                 DME Arranged: 3-N-1 DME Agency: [employer]       HH Arranged: PT, OT, RN          Prior Living Arrangements/Services Living arrangements for the past 2 months: Single Family Home Lives with:: Spouse Patient language and need for interpreter reviewed:: Yes Do you feel safe going back to the place where you live?: Yes      Need for Family Participation in Patient Care: Yes (Comment) Care giver support system in place?: Yes (comment) Current home services: DME Criminal  Activity/Legal Involvement Pertinent to Current Situation/Hospitalization: No - Comment as needed  Activities of Daily Living Home Assistive Devices/Equipment: Environmental consultant (specify type) (Front wheel walker) ADL Screening (condition at time of admission) Patient's cognitive ability adequate to safely complete daily activities?: Yes (Needs assistance sometimes) Is the patient deaf or have difficulty hearing?: No Does the patient have difficulty seeing, even when wearing glasses/contacts?: No Does the patient have difficulty concentrating, remembering, or making decisions?: No Patient able to express need for assistance with ADLs?: Yes Does the patient have difficulty dressing or bathing?: No Independently performs ADLs?: Yes (appropriate for developmental age) Does the patient have difficulty walking or climbing stairs?: Yes Weakness of Legs: Both Weakness of Arms/Hands: None  Permission Sought/Granted Permission sought to share information with : Facility Sport and exercise psychologist, Family Supports Permission granted to share information with : Yes, Verbal Permission Granted     Permission granted to share info w AGENCY: as needed  Permission granted to share info w Relationship: spouse     Emotional Assessment       Orientation: : Oriented to Self, Oriented to Place, Oriented to  Time, Oriented to Situation Alcohol / Substance Use: Not Applicable Psych Involvement: No (comment)  Admission diagnosis:  Gas gangrene ([place]) [A48.0] Soft tissue infection of foot [L08.9] Patient Active Problem  List   Diagnosis Date Noted   Soft tissue infection of foot 11/21/2022   Elevated troponin 11/21/2022   Gas gangrene ([place]) 11/21/2022   Hypokalemia 11/21/2022   B12 deficiency 09/05/2022   Proliferative diabetic retinopathy of both eyes without macular edema associated with type 2 diabetes mellitus ([place]) 06/05/2022   PAD (peripheral artery disease) ([place] ) 04/12/2021   AKI (acute kidney injury) ([place])  04/12/2021   Secondary renal hyperparathyroidism ([place]) 03/19/2019   Diabetic ulcer of foot with fat layer exposed ([place]) 12/18/2018   Dyslipidemia associated with type 2 diabetes mellitus ([place]) 07/03/2018   Vitamin D deficiency 04/11/2018   ED (erectile dysfunction) 04/10/2018   Lower urinary tract symptoms (LUTS) 02/09/2017   Hypertension 07/05/2015   Hyperglycemia due to type 2 diabetes mellitus ([place]) 07/05/2015   HLD (hyperlipidemia) 07/05/2015   PCP:  [name], MD Pharmacy:   [id number] [address], [place] [address] [place] [address] Phone: [phone] Fax: [place] [address]), [place] - [place]) [place] [address] Phone: [phone] Fax: [phone]     Social Determinants of Health (SDOH) Interventions Food Insecurity Interventions: Inpatient TOC Utilities Interventions: Inpatient TOC  Readmission Risk Interventions     No data to display

## 2022-11-28 NOTE — Progress Notes (Signed)
Physical Therapy Treatment Patient Details Name: [name] MRN: [id number] DOB: 04-29-1966 Today's Date: 11/28/2022   History of Present Illness Pt is a 56 year old male s/p R BKA 11/22/22,  AKA 11/24/22; PMH significant for non-insulin-dependent diabetes mellitus, hypertension, hyperlipidemia    PT Comments    Pt received in bed with spouse at bedside. Agreeable to PT. Discussion with pt and spouse on safe at home practice with transfers educating on safe scoot transfers with drop arm W/c and need for drop arm BSC.  Education provided on amputee desensitization techniques, how to prevent hip flexor contracture. Pt and spouse verbalized understanding. Pt able to stand pivot with minguard from bed <> recliner then recliner <> bed. Encouraged pt at home safer option to transfer is the scoot transfer compared to stand pivot due to absence of skilled PT. Encouraged to trial progression of standing transfers with [facility] PT. PT understanding. Will update POC to [facility] PT services with appropriate DME.     Recommendations for follow up therapy are one component of a multi-disciplinary discharge planning process, led by the attending physician.  Recommendations may be updated based on patient status, additional functional criteria and insurance authorization.  Follow Up Recommendations  Home health PT     Assistance Recommended at Discharge Frequent or constant Supervision/Assistance  Patient can return home with the following A lot of help with bathing/dressing/bathroom;Two people to help with walking and/or transfers;Help with stairs or ramp for entrance;Assistance with cooking/housework;Assist for transportation   Equipment Recommendations  Other (comment) (drop arm BSC)    Recommendations for Other Services       Precautions / Restrictions Precautions Precautions: Fall Precaution Comments: R AKA Restrictions Weight Bearing Restrictions: No     Mobility  Bed Mobility Overal bed mobility:  Needs Assistance Bed Mobility: Supine to Sit     Supine to sit: Modified independent (Device/Increase time)     General bed mobility comments: bed features Patient Response: Cooperative  Transfers Overall transfer level: Needs assistance Equipment used: None Transfers: Bed to chair/wheelchair/BSC   Stand pivot transfers: Min guard              Ambulation/Gait                   Stairs             Wheelchair Mobility    Modified Rankin (Stroke Patients Only)       Balance Overall balance assessment: Needs assistance Sitting-balance support: Feet supported Sitting balance-Leahy Scale: Good                                      Cognition Arousal/Alertness: Awake/alert Behavior During Therapy: WFL for tasks assessed/performed Overall Cognitive Status: Within Functional Limits for tasks assessed                                          Exercises Other Exercises Other Exercises: Scoot transfers for home safety, AKA positioning and importance for hip flexor flexibility to prevent contracture. Education on need for 3 in 1 drop arm    General Comments        Pertinent Vitals/Pain Pain Assessment Pain Assessment: No/denies pain    Home Living  Prior Function            PT Goals (current goals can now be found in the care plan section) Acute Rehab PT Goals Patient Stated Goal: get better PT Goal Formulation: With patient Time For Goal Achievement: 12/10/22 Potential to Achieve Goals: Good Progress towards PT goals: Progressing toward goals    Frequency    7X/week      PT Plan Discharge plan needs to be updated    Co-evaluation              AM-PAC PT "6 Clicks" Mobility   Outcome Measure  Help needed turning from your back to your side while in a flat bed without using bedrails?: A Little Help needed moving from lying on your back to sitting on the side  of a flat bed without using bedrails?: A Little Help needed moving to and from a bed to a chair (including a wheelchair)?: A Little Help needed standing up from a chair using your arms (e.g., wheelchair or bedside chair)?: Total Help needed to walk in hospital room?: Total Help needed climbing 3-5 steps with a railing? : Total 6 Click Score: 12    End of Session Equipment Utilized During Treatment: Gait belt Activity Tolerance: Patient tolerated treatment well Patient left: in bed;with family/visitor present;with bed alarm set Nurse Communication: Mobility status PT Visit Diagnosis: Other abnormalities of gait and mobility (R26.89);Difficulty in walking, not elsewhere classified (R26.2);Muscle weakness (generalized) (M62.81)     Time: 8144-8185 PT Time Calculation (min) (ACUTE ONLY): 26 min  Charges:  $Therapeutic Activity: 23-37 mins                    Jossue Rubenstein M. Fairly IV, PT, DPT Physical Therapist- [facility]  11/28/2022, 4:03 PM

## 2022-11-29 DIAGNOSIS — A480 Gas gangrene: Secondary | ICD-10-CM | POA: Diagnosis not present

## 2022-11-29 DIAGNOSIS — E1165 Type 2 diabetes mellitus with hyperglycemia: Secondary | ICD-10-CM

## 2022-11-29 LAB — CBC
HCT: 22.6 % — ABNORMAL LOW (ref 39.0–52.0)
Hemoglobin: 7.3 g/dL — ABNORMAL LOW (ref 13.0–17.0)
MCH: 28.9 pg (ref 26.0–34.0)
MCHC: 32.3 g/dL (ref 30.0–36.0)
MCV: 89.3 fL (ref 80.0–100.0)
Platelets: 225 10*3/uL (ref 150–400)
RBC: 2.53 MIL/uL — ABNORMAL LOW (ref 4.22–5.81)
RDW: 14 % (ref 11.5–15.5)
WBC: 15 10*3/uL — ABNORMAL HIGH (ref 4.0–10.5)
nRBC: 0 % (ref 0.0–0.2)

## 2022-11-29 LAB — BASIC METABOLIC PANEL
Anion gap: 5 (ref 5–15)
BUN: 22 mg/dL — ABNORMAL HIGH (ref 6–20)
CO2: 27 mmol/L (ref 22–32)
Calcium: 7.8 mg/dL — ABNORMAL LOW (ref 8.9–10.3)
Chloride: 105 mmol/L (ref 98–111)
Creatinine, Ser: 0.78 mg/dL (ref 0.61–1.24)
GFR, Estimated: >60 mL/min (ref >60)
Glucose, Bld: 201 mg/dL — ABNORMAL HIGH (ref 70–99)
Potassium: 3.8 mmol/L (ref 3.5–5.1)
Sodium: 137 mmol/L (ref 135–145)

## 2022-11-29 LAB — PHOSPHORUS: Phosphorus: 2.9 mg/dL (ref 2.5–4.6)

## 2022-11-29 LAB — GLUCOSE, CAPILLARY
Glucose-Capillary: 209 mg/dL — ABNORMAL HIGH (ref 70–99)
Glucose-Capillary: 271 mg/dL — ABNORMAL HIGH (ref 70–99)
Glucose-Capillary: 271 mg/dL — ABNORMAL HIGH (ref 70–99)
Glucose-Capillary: 201 mg/dL — ABNORMAL HIGH (ref 70–99)

## 2022-11-29 LAB — MAGNESIUM: Magnesium: 2 mg/dL (ref 1.7–2.4)

## 2022-11-29 MED ORDER — MIDODRINE HCL 5 MG PO TABS
5.0000 mg | ORAL_TABLET | Freq: Three times a day (TID) | ORAL | Status: DC
  Administered 2022-11-29: 5 mg via ORAL
  Filled 2022-11-29: qty 1

## 2022-11-29 MED ORDER — INSULIN ASPART 100 UNIT/ML IJ SOLN
0.0000 [IU] | Freq: Three times a day (TID) | INTRAMUSCULAR | Status: DC
  Administered 2022-11-29: 8 [IU] via SUBCUTANEOUS
  Administered 2022-11-30: 5 [IU] via SUBCUTANEOUS
  Administered 2022-11-30 (×2): 3 [IU] via SUBCUTANEOUS
  Filled 2022-11-29 (×4): qty 1

## 2022-11-29 MED ORDER — INSULIN GLARGINE-YFGN 100 UNIT/ML ~~LOC~~ SOLN
12.0000 [IU] | Freq: Every day | SUBCUTANEOUS | Status: DC
  Administered 2022-11-29: 12 [IU] via SUBCUTANEOUS
  Filled 2022-11-29 (×2): qty 0.12

## 2022-11-29 MED ORDER — INSULIN ASPART 100 UNIT/ML IJ SOLN
0.0000 [IU] | Freq: Every day | INTRAMUSCULAR | Status: DC
  Administered 2022-11-29: 2 [IU] via SUBCUTANEOUS
  Filled 2022-11-29: qty 1

## 2022-11-29 NOTE — Plan of Care (Signed)
  Problem: Education: Goal: Ability to describe self-care measures that may prevent or decrease complications (Diabetes Survival Skills Education) will improve Outcome: Progressing Goal: Individualized Educational Video(s) Outcome: Progressing   Problem: Coping: Goal: Ability to adjust to condition or change in health will improve Outcome: Progressing   Problem: Fluid Volume: Goal: Ability to maintain a balanced intake and output will improve Outcome: Progressing   Problem: Health Behavior/Discharge Planning: Goal: Ability to identify and utilize available resources and services will improve Outcome: Progressing Goal: Ability to manage health-related needs will improve Outcome: Progressing   Problem: Nutritional: Goal: Maintenance of adequate nutrition will improve Outcome: Progressing Goal: Progress toward achieving an optimal weight will improve Outcome: Progressing   Problem: Skin Integrity: Goal: Risk for impaired skin integrity will decrease Outcome: Progressing   Problem: Tissue Perfusion: Goal: Adequacy of tissue perfusion will improve Outcome: Progressing   Problem: Education: Goal: Knowledge of General Education information will improve Description: Including pain rating scale, medication(s)/side effects and non-pharmacologic comfort measures Outcome: Progressing   Problem: Health Behavior/Discharge Planning: Goal: Ability to manage health-related needs will improve Outcome: Progressing   Problem: Clinical Measurements: Goal: Ability to maintain clinical measurements within normal limits will improve Outcome: Progressing Goal: Will remain free from infection Outcome: Progressing Goal: Diagnostic test results will improve Outcome: Progressing Goal: Respiratory complications will improve Outcome: Progressing Goal: Cardiovascular complication will be avoided Outcome: Progressing   Problem: Activity: Goal: Risk for activity intolerance will  decrease Outcome: Progressing   Problem: Nutrition: Goal: Adequate nutrition will be maintained Outcome: Progressing   Problem: Coping: Goal: Level of anxiety will decrease Outcome: Progressing   Problem: Elimination: Goal: Will not experience complications related to bowel motility Outcome: Progressing Goal: Will not experience complications related to urinary retention Outcome: Progressing   Problem: Pain Managment: Goal: General experience of comfort will improve Outcome: Progressing   Problem: Safety: Goal: Ability to remain free from injury will improve Outcome: Progressing   Problem: Skin Integrity: Goal: Risk for impaired skin integrity will decrease Outcome: Progressing

## 2022-11-29 NOTE — TOC Progression Note (Signed)
Transition of Care [facility]) - Progression Note    Patient Details  Name: [name] MRN: [id number] Date of Birth: 30-May-1966  Transition of Care [facility]) CM/SW [place], [place] Phone Number: 11/29/2022, 10:23 AM  Clinical Narrative:     Sharyn Creamer with [facility] reports being able to accept patient for [facility] PT OT and RN. OOP has been met so no co pay until January 1st, family aware and in agreement.   Pending medical readiness to dc home with HH. No dme needs as family reports already having 43in1 at home.    Barriers to Discharge: Continued Medical Work up  Expected Discharge Plan and Services       Living arrangements for the past 2 months: [place]                 DME Arranged: 3-N-1 DME Agency: [employer]       HH Arranged: PT, OT, RN           Social Determinants of Health (SDOH) Interventions SDOH Screenings   Food Insecurity: Food Insecurity Present (11/22/2022)  Housing: Low Risk  (11/22/2022)  Transportation Needs: No Transportation Needs (11/22/2022)  Recent Concern: Transportation Needs - Unmet Transportation Needs (11/22/2022)  Utilities: At Risk (11/22/2022)  Alcohol Screen: Low Risk  (02/27/2020)  Depression (PHQ2-9): Low Risk  (09/05/2022)  Financial Resource Strain: Low Risk  (07/02/2019)  Physical Activity: Insufficiently Active (07/02/2019)  Stress: No Stress Concern Present (07/02/2019)  Tobacco Use: Low Risk  (11/25/2022)    Readmission Risk Interventions     No data to display

## 2022-11-29 NOTE — Inpatient Diabetes Management (Signed)
Inpatient Diabetes Program Recommendations  AACE/ADA: New Consensus Statement on Inpatient Glycemic Control (2015)  Target Ranges:  Prepandial:   less than 140 mg/dL      Peak postprandial:   less than 180 mg/dL (1-2 hours)      Critically ill patients:  140 - 180 mg/dL    Latest Reference Range & Units 11/28/22 08:07 11/28/22 11:59 11/28/22 16:23 11/28/22 21:04  Glucose-Capillary 70 - 99 mg/dL 142 (H) 140 (H)  1 unit Novolog 315 (H)  4 units Novolog  323 (H)  4 units Novolog  10 units Semglee  (H): Data is abnormally high  Latest Reference Range & Units 11/29/22 07:25 11/29/22 12:22  Glucose-Capillary 70 - 99 mg/dL 209 (H)  3 units Novolog  271 (H)  5 units Novolog   (H): Data is abnormally high    Home DM Meds: Jardiance 25 mg Daily     Glipizide 5 mg Daily      Rybelsus 14 mg Daily (NOT taking)  Current Orders: Semglee 10 units QHS     Novolog Sensitive Correction Scale/ SSI (0-9 units) TID AC + HS     MD- Note afternoon CBGs elevated--Eating 100% meals  Please consider starting Novolog Meal Coverage while home oral DM meds are on hold:  Novolog 4 units TID with meals HOLD if pt NPO HOLD if pt eats <50% meals    --Will follow patient during hospitalization--  Wyn Quaker RN, MSN, [place] Diabetes Coordinator Inpatient Glycemic Control Team Team Pager: [phone] (8a-5p)

## 2022-11-29 NOTE — Progress Notes (Signed)
Significant other at bedside activated call light, requesting pain medications for patient. This nurse to room to assess pain level. When arrived patient was resting with eyes closed, with no s/s of distress. Patient was asked, if having pain and where? Patient stated, I don't have any pain but I want the pain medication. Patient was educated that this nurse can not give pain medications unless having pain. Asked again if having pain since patient seems to be dozing, responded, No. Asked if needed anything else at this time, declined.  Patient then closed eyes again. Call light in reach.

## 2022-11-29 NOTE — Progress Notes (Signed)
Physical Therapy Treatment Patient Details Name: [name] MRN: [id number] DOB: 11-10-1966 Today's Date: 11/29/2022   History of Present Illness Pt is a 56 year old male s/p R BKA 11/22/22,  AKA 11/24/22; PMH significant for non-insulin-dependent diabetes mellitus, hypertension, hyperlipidemia    PT Comments    Pt progressed with sit<>stands with RW, ModA and "hopped" stepped with RW to recliner (4') with min guard A.  Pt is supervision with bed mobility without rails and bed flat.  Current d/c plan is appropriate.  Recommendations for follow up therapy are one component of a multi-disciplinary discharge planning process, led by the attending physician.  Recommendations may be updated based on patient status, additional functional criteria and insurance authorization.  Follow Up Recommendations  Home health PT     Assistance Recommended at Discharge Intermittent Supervision/Assistance  Patient can return home with the following A lot of help with bathing/dressing/bathroom;Two people to help with walking and/or transfers;Help with stairs or ramp for entrance;Assistance with cooking/housework;Assist for transportation   Equipment Recommendations  Other (comment);BSC/3in1 (drop arm BSC)    Recommendations for Other Services       Precautions / Restrictions Precautions Precautions: Fall Precaution Comments: R AKA Restrictions Weight Bearing Restrictions: No     Mobility  Bed Mobility Overal bed mobility: Needs Assistance Bed Mobility: Supine to Sit     Supine to sit: Modified independent (Device/Increase time)     General bed mobility comments: bed features    Transfers Overall transfer level: Needs assistance Equipment used: Rolling walker (2 wheels) Transfers: Bed to chair/wheelchair/BSC, Sit to/from Stand Sit to Stand: Mod assist   Step pivot transfers: Min guard       General transfer comment: Mod A for sit>stand once pt was fully upright at walker, pt  hopped to recliner 4' with Min guard.    Ambulation/Gait Ambulation/Gait assistance: Min guard Gait Distance (Feet): 4 Feet Assistive device: Rolling walker (2 wheels) Gait Pattern/deviations: Trunk flexed       General Gait Details: pt hopped to recliner 4' with Min guard.   Stairs             Wheelchair Mobility    Modified Rankin (Stroke Patients Only)       Balance Overall balance assessment: Needs assistance Sitting-balance support: Feet supported Sitting balance-Leahy Scale: Good Sitting balance - Comments: supervision, pt had difficulty weight shifting from L sidelying to midline.   Standing balance support: Bilateral upper extremity supported, During functional activity, Reliant on assistive device for balance Standing balance-Leahy Scale: Fair                              Cognition Arousal/Alertness: Awake/alert   Overall Cognitive Status: Within Functional Limits for tasks assessed Area of Impairment: Awareness, Problem solving, Following commands                       Following Commands: Follows one step commands consistently   Awareness: Emergent Problem Solving: Slow processing General Comments: Very talkative, cooperative. Receptive to PT education.        Exercises Amputee Exercises Hip Extension: AROM, Strengthening, Right, Sidelying, 10 reps Hip ABduction/ADduction: AROM, Sidelying, 10 reps, Strengthening, Right Other Exercises Other Exercises: Attempted supine bridging but Pt had difficulty following directions with this exercise.  Reviewed EDU on hip flexor contractions and the importance of stretching and strengthening and high fall risk esp when pt goes home initially.  General Comments        Pertinent Vitals/Pain Pain Assessment Pain Assessment: Faces Faces Pain Scale: Hurts even more Pain Location: R LE Pain Descriptors / Indicators: Aching Pain Intervention(s): Patient requesting pain meds-RN  notified    Home Living                          Prior Function            PT Goals (current goals can now be found in the care plan section) Acute Rehab PT Goals Patient Stated Goal: get better PT Goal Formulation: With patient Time For Goal Achievement: 12/10/22 Potential to Achieve Goals: Good Progress towards PT goals: Progressing toward goals    Frequency    7X/week      PT Plan Current plan remains appropriate    Co-evaluation              AM-PAC PT "6 Clicks" Mobility   Outcome Measure  Help needed turning from your back to your side while in a flat bed without using bedrails?: A Little Help needed moving from lying on your back to sitting on the side of a flat bed without using bedrails?: A Little Help needed moving to and from a bed to a chair (including a wheelchair)?: A Little Help needed standing up from a chair using your arms (e.g., wheelchair or bedside chair)?: A Lot Help needed to walk in hospital room?: A Lot Help needed climbing 3-5 steps with a railing? : Total 6 Click Score: 14    End of Session Equipment Utilized During Treatment: Gait belt Activity Tolerance: Patient tolerated treatment well Patient left: with family/visitor present;in chair;with chair alarm set;with call bell/phone within reach Nurse Communication: Mobility status PT Visit Diagnosis: Other abnormalities of gait and mobility (R26.89);Difficulty in walking, not elsewhere classified (R26.2);Muscle weakness (generalized) (M62.81)     Time: 1030-1103 PT Time Calculation (min) (ACUTE ONLY): 33 min  Charges:  $Therapeutic Exercise: 8-22 mins $Therapeutic Activity: 8-22 mins                     Bjorn Loser, PTA  11/29/22, 11:47 AM

## 2022-11-29 NOTE — Progress Notes (Addendum)
Progress Note    [name]  CBJ:[id number] DOB: [birth year]  DOA: 11/21/2022 PCP: Zachary Sizer, MD      Brief Narrative:    Medical records reviewed and are as summarized below:  [name] is a 56 y.o. male with medical history significant non-insulin-dependent diabetes mellitus, hypertension, hyperlipidemia, who presents to the emergency department for chief concerns of weakness and loss of appetite for 4 days. Of note he had a right fifth toe amputation due to diabetes.  He complained of foul-smelling odor at the wound site.  He was diagnosed with diabetic foot infection with necrotizing fasciitis of the right leg.      Assessment/Plan:   Principal Problem:   Gas gangrene (HCC) Active Problems:   Hypertension   Hyperglycemia due to type 2 diabetes mellitus ([place])   HLD (hyperlipidemia)   Diabetic ulcer of foot with fat layer exposed ([place])   PAD (peripheral artery disease) (HCC)   AKI (acute kidney injury) ([place])   B12 deficiency   Soft tissue infection of foot   Elevated troponin   Hypokalemia    Body mass index is 23.15 kg/m.  Right lower extremity gas gangrene, diabetic foot infection with necrotizing fasciitis of right leg: S/p right BKA on 11/22/2022, s/p right AKA on 11/24/2022.  He is no longer on antibiotics.  PT recommends home health therapy.  Type II DM with hyperglycemia: Increase insulin glargine from 10 units to 12 units nightly.  Increase sliding scale from mild to moderate.  Monitor glucose levels closely and adjust insulin accordingly.  Semaglutide, Jardiance and glipizide have been on hold since admission  Acute blood loss anemia: S/p transfusion with 1 units of PRBCs on 12/15 and 12/17.  Monitor H&H and transfuse as needed.  Recurrent hypotension: Asymptomatic.  Monitor BP closely.  Check cortisol level tomorrow morning.  Start midodrine.  Diovan-HCTZ and Demadex have been on hold since admission.  Hypokalemia, hypophosphatemia:  Improved  Elevated troponin: This was secondary to demand ischemia  AKI: Resolved  Other comorbidities include hyperlipidemia, hypertension     Diet Order             Diet Carb Modified Fluid consistency: Thin; Room service appropriate? Yes  Diet effective now                            Consultants: Podiatrist Vascular surgeon General surgeon  Procedures: Right open BKA on 11/22/2022 Right above-the-knee amputation on 11/24/2022    Medications:    chlorhexidine  60 mL Topical Once   heparin  5,000 Units Subcutaneous Q8H   insulin aspart  0-5 Units Subcutaneous QHS   insulin aspart  0-9 Units Subcutaneous TID WC   insulin glargine-yfgn  10 Units Subcutaneous QHS   phosphorus  250 mg Oral TID   polyethylene glycol  17 g Oral BID   rosuvastatin  40 mg Oral QHS   Continuous Infusions:  sodium chloride       Anti-infectives (From admission, onward)    Start     Dose/Rate Route Frequency Ordered Stop   11/24/22 2200  ceFEPIme (MAXIPIME) 2 g in sodium chloride 0.9 % 100 mL IVPB       Note to Pharmacy: OK to DC after 3 more doses   2 g 200 mL/hr over 30 Minutes Intravenous Every 8 hours 11/24/22 1415 11/25/22 1620   11/23/22 1200  linezolid (ZYVOX) IVPB 600 mg  Status:  Discontinued  600 mg 300 mL/hr over 60 Minutes Intravenous Every 12 hours 11/23/22 1021 11/24/22 1015   11/22/22 2300  vancomycin (VANCOREADY) IVPB 1500 mg/300 mL  Status:  Discontinued        1,500 mg 150 mL/hr over 120 Minutes Intravenous Every 24 hours 11/22/22 1144 11/23/22 1021   11/22/22 2200  ceFEPIme (MAXIPIME) 2 g in sodium chloride 0.9 % 100 mL IVPB  Status:  Discontinued        2 g 200 mL/hr over 30 Minutes Intravenous Every 8 hours 11/22/22 1144 11/24/22 1415   11/22/22 1100  metroNIDAZOLE (FLAGYL) IVPB 500 mg  Status:  Discontinued        500 mg 100 mL/hr over 60 Minutes Intravenous Every 12 hours 11/22/22 1013 11/26/22 1034   11/21/22 2200  ceFEPIme (MAXIPIME) 2  g in sodium chloride 0.9 % 100 mL IVPB  Status:  Discontinued        2 g 200 mL/hr over 30 Minutes Intravenous Every 12 hours 11/21/22 2129 11/22/22 1144   11/21/22 2100  piperacillin-tazobactam (ZOSYN) IVPB 3.375 g  Status:  Discontinued        3.375 g 100 mL/hr over 30 Minutes Intravenous  Once 11/21/22 2046 11/21/22 2113   11/21/22 2100  vancomycin (VANCOREADY) IVPB 1500 mg/300 mL        1,500 mg 150 mL/hr over 120 Minutes Intravenous  Once 11/21/22 2046 11/22/22 1049   11/21/22 2100  metroNIDAZOLE (FLAGYL) IVPB 500 mg        500 mg 100 mL/hr over 60 Minutes Intravenous  Once 11/21/22 2046 11/22/22 0016              Family Communication/Anticipated D/C date and plan/Code Status   DVT prophylaxis: heparin injection 5,000 Units Start: 11/22/22 2200 Place TED hose Start: 11/21/22 2120     Code Status: Full Code  Family Communication: [name], mother, the bedside Disposition Plan: Plan to discharge home with home health therapy   Status is: Inpatient Remains inpatient appropriate because: Monitor hemoglobin and blood pressure       Subjective:   Interval events noted.  Pain from right AKA stump is better after he took analgesics.  No shortness of breath, chest pain, dizziness, general weakness or easy fatigability.  He feels better today.  [name], mother, was at the bedside  Objective:    Vitals:   11/29/22 0421 11/29/22 0724 11/29/22 1222 11/29/22 1243  BP: 125/72 118/73 (!) 82/53   Pulse: 81 88 93 88  Resp: '20 17 17 18  '$ Temp: 98.6 F (37 C) 98.3 F (36.8 C) (!) 97.5 F (36.4 C)   TempSrc: Oral     SpO2: 99% 100% 94%   Weight:      Height:       No data found.   Intake/Output Summary (Last 24 hours) at 11/29/2022 1321 Last data filed at 11/29/2022 1016 Gross per 24 hour  Intake --  Output 550 ml  Net -550 ml   Filed Weights   11/21/22 1743 11/22/22 0447 11/24/22 0847  Weight: 85.7 kg 75.3 kg 75.3 kg    Exam:  GEN: NAD SKIN: Warm and  dry EYES: EOMI ENT: MMM CV: RRR PULM: CTA B ABD: soft, ND, NT, +BS CNS: AAO x 3, non focal EXT: Right AKA stump wound looks clean, dry and intact    Pressure Injury 11/23/22 Buttocks Right Stage 2 -  Partial thickness loss of dermis presenting as a shallow open injury with a red, pink wound bed  without slough. red (Active)  11/23/22 2000  Location: Buttocks  Location Orientation: Right  Staging: Stage 2 -  Partial thickness loss of dermis presenting as a shallow open injury with a red, pink wound bed without slough.  Wound Description (Comments): red  Present on Admission: -- (present on assessment)  Dressing Type Foam - Lift dressing to assess site every shift 11/28/22 2020     Data Reviewed:   I have personally reviewed following labs and imaging studies:  Labs: Labs show the following:   Basic Metabolic Panel: Recent Labs  Lab 11/24/22 1232 11/25/22 0511 11/26/22 0451 11/27/22 0310 11/28/22 0449 11/29/22 0609  NA 140 138 135 135 138 137  K 3.5 3.3* 3.7 3.7 3.8 3.8  CL 109 111 105 104 108 105  CO2 19* '24 27 25 27 27  '$ GLUCOSE 245* 164* 237* 205* 133* 201*  BUN 26* 22* 24* 23* 22* 22*  CREATININE 0.92 0.81 0.88 0.81 0.69 0.78  CALCIUM 8.6* 8.8* 8.4* 8.4* 8.1* 7.8*  MG 1.8 2.3 2.1  --   --  2.0  PHOS 2.0* 2.2* 1.8*  --  2.4* 2.9   GFR Estimated Creatinine Clearance: 111.1 mL/min (by C-G formula based on SCr of 0.78 mg/dL). Liver Function Tests: Recent Labs  Lab 11/24/22 1232 11/25/22 0511  AST 14* 19  ALT 8 9  ALKPHOS 59 62  BILITOT 1.3* 1.2  PROT 5.5* 5.8*  ALBUMIN <1.5* 1.7*   No results for input(s): "LIPASE", "AMYLASE" in the last 168 hours. No results for input(s): "AMMONIA" in the last 168 hours. Coagulation profile No results for input(s): "INR", "PROTIME" in the last 168 hours.  CBC: Recent Labs  Lab 11/23/22 0823 11/24/22 1232 11/25/22 0511 11/26/22 0451 11/27/22 0310 11/29/22 0609  WBC 13.3* 10.5 11.9* 11.3* 13.4* 15.0*  NEUTROABS  11.6* 9.5* 10.0* 9.3* 11.0*  --   HGB 7.0* 6.6* 8.2* 6.8* 8.7* 7.3*  HCT 22.5* 20.9* 25.0* 20.7* 26.1* 22.6*  MCV 88.9 89.3 87.4 87.0 86.7 89.3  PLT 265 248 230 223 226 225   Cardiac Enzymes: No results for input(s): "CKTOTAL", "CKMB", "CKMBINDEX", "TROPONINI" in the last 168 hours. BNP (last 3 results) No results for input(s): "PROBNP" in the last 8760 hours. CBG: Recent Labs  Lab 11/28/22 1159 11/28/22 1623 11/28/22 2104 11/29/22 0725 11/29/22 1222  GLUCAP 140* 315* 323* 209* 271*   D-Dimer: No results for input(s): "DDIMER" in the last 72 hours. Hgb A1c: No results for input(s): "HGBA1C" in the last 72 hours. Lipid Profile: No results for input(s): "CHOL", "HDL", "LDLCALC", "TRIG", "CHOLHDL", "LDLDIRECT" in the last 72 hours. Thyroid function studies: No results for input(s): "TSH", "T4TOTAL", "T3FREE", "THYROIDAB" in the last 72 hours.  Invalid input(s): "FREET3" Anemia work up: No results for input(s): "VITAMINB12", "FOLATE", "FERRITIN", "TIBC", "IRON", "RETICCTPCT" in the last 72 hours. Sepsis Labs: Recent Labs  Lab 11/25/22 0511 11/26/22 0451 11/27/22 0310 11/29/22 0609  WBC 11.9* 11.3* 13.4* 15.0*    Microbiology Recent Results (from the past 240 hour(s))  Culture, blood (Routine X 2) w Reflex to ID Panel     Status: None   Collection Time: 11/21/22  9:26 PM   Specimen: BLOOD  Result Value Ref Range Status   Specimen Description BLOOD LEFT HAND  Final   Special Requests   Final    BOTTLES DRAWN AEROBIC AND ANAEROBIC Blood Culture results may not be optimal due to an inadequate volume of blood received in culture bottles   Culture   Final  NO GROWTH 5 DAYS Performed at [facility], [address] [place], [place] [address]    Report Status 11/26/2022 FINAL  Final  Culture, blood (Routine X 2) w Reflex to ID Panel     Status: None   Collection Time: 11/21/22  9:47 PM   Specimen: BLOOD  Result Value Ref Range Status   Specimen Description  BLOOD RIGHT ASSIST CONTROL  Final   Special Requests   Final    BOTTLES DRAWN AEROBIC AND ANAEROBIC Blood Culture adequate volume   Culture   Final    NO GROWTH 5 DAYS Performed at [facility], [address], [place], [place] [address]    Report Status 11/26/2022 FINAL  Final  Aerobic/Anaerobic Culture w Gram Stain (surgical/deep wound)     Status: None   Collection Time: 11/22/22  1:38 AM   Specimen: PATH Other; Tissue  Result Value Ref Range Status   Specimen Description   Final    WOUND RIGHT LEG Performed at [facility], [address], [place], [place] [address]    Special Requests   Final    NONE Performed at [facility], [place], [place] [address]    Gram Stain   Final    NO WBC SEEN ABUNDANT GRAM POSITIVE COCCI IN PAIRS RARE GRAM POSITIVE COCCI IN CHAINS Performed at [facility], [place] [address] [place],  [address]    Culture   Final    RARE ESCHERICHIA COLI ABUNDANT GROUP B STREP(S.AGALACTIAE)ISOLATED TESTING AGAINST S. AGALACTIAE NOT ROUTINELY PERFORMED DUE TO PREDICTABILITY OF AMP/PEN/VAN SUSCEPTIBILITY. MIXED ANAEROBIC FLORA PRESENT.  CALL LAB IF FURTHER IID REQUIRED.    Report Status 11/27/2022 FINAL  Final   Organism ID, Bacteria ESCHERICHIA COLI  Final      Susceptibility   Escherichia coli - MIC*    AMPICILLIN >=32 RESISTANT Resistant     CEFAZOLIN <=4 SENSITIVE Sensitive     CEFEPIME <=0.12 SENSITIVE Sensitive     CEFTAZIDIME <=1 SENSITIVE Sensitive     CEFTRIAXONE <=0.25 SENSITIVE Sensitive     CIPROFLOXACIN <=0.25 SENSITIVE Sensitive     GENTAMICIN <=1 SENSITIVE Sensitive     IMIPENEM <=0.25 SENSITIVE Sensitive     TRIMETH/SULFA >=320 RESISTANT Resistant     AMPICILLIN/SULBACTAM 16 INTERMEDIATE Intermediate     PIP/TAZO <=4 SENSITIVE Sensitive     * RARE ESCHERICHIA COLI    Procedures and diagnostic studies:  No results found.             LOS: 8 days   Carianna Lague  [facility]   Pager on [email or web address]. If 7PM-7AM, please contact night-coverage at [email or web address]     11/29/2022, 1:21 PM

## 2022-11-29 NOTE — Progress Notes (Signed)
BP 82/67. Frequent rise and fall during this shift. MD notified. 5 mg midodrine started. Patient educated and verbalized understanding.

## 2022-11-30 DIAGNOSIS — I739 Peripheral vascular disease, unspecified: Secondary | ICD-10-CM | POA: Diagnosis not present

## 2022-11-30 DIAGNOSIS — E1165 Type 2 diabetes mellitus with hyperglycemia: Secondary | ICD-10-CM | POA: Diagnosis not present

## 2022-11-30 DIAGNOSIS — A480 Gas gangrene: Secondary | ICD-10-CM | POA: Diagnosis not present

## 2022-11-30 LAB — CBC WITH DIFFERENTIAL/PLATELET
Abs Immature Granulocytes: 0.1 10*3/uL — ABNORMAL HIGH (ref 0.00–0.07)
Basophils Absolute: 0 10*3/uL (ref 0.0–0.1)
Basophils Relative: 0 %
Eosinophils Absolute: 0 10*3/uL (ref 0.0–0.5)
Eosinophils Relative: 0 %
HCT: 23.5 % — ABNORMAL LOW (ref 39.0–52.0)
Hemoglobin: 7.7 g/dL — ABNORMAL LOW (ref 13.0–17.0)
Immature Granulocytes: 1 %
Lymphocytes Relative: 12 %
Lymphs Abs: 1.3 10*3/uL (ref 0.7–4.0)
MCH: 29.4 pg (ref 26.0–34.0)
MCHC: 32.8 g/dL (ref 30.0–36.0)
MCV: 89.7 fL (ref 80.0–100.0)
Monocytes Absolute: 0.8 10*3/uL (ref 0.1–1.0)
Monocytes Relative: 7 %
Neutro Abs: 9.3 10*3/uL — ABNORMAL HIGH (ref 1.7–7.7)
Neutrophils Relative %: 80 %
Platelets: 245 10*3/uL (ref 150–400)
RBC: 2.62 MIL/uL — ABNORMAL LOW (ref 4.22–5.81)
RDW: 15 % (ref 11.5–15.5)
WBC: 11.5 10*3/uL — ABNORMAL HIGH (ref 4.0–10.5)
nRBC: 0 % (ref 0.0–0.2)

## 2022-11-30 LAB — GLUCOSE, CAPILLARY
Glucose-Capillary: 170 mg/dL — ABNORMAL HIGH (ref 70–99)
Glucose-Capillary: 152 mg/dL — ABNORMAL HIGH (ref 70–99)
Glucose-Capillary: 166 mg/dL — ABNORMAL HIGH (ref 70–99)
Glucose-Capillary: 202 mg/dL — ABNORMAL HIGH (ref 70–99)

## 2022-11-30 LAB — CORTISOL: Cortisol, Plasma: 10.4 ug/dL

## 2022-11-30 MED ORDER — ACETAMINOPHEN 325 MG PO TABS: 650.0000 mg | ORAL_TABLET | Freq: Four times a day (QID) | ORAL | Status: DC | PRN

## 2022-11-30 MED ORDER — OXYCODONE HCL 5 MG PO TABS: 5.0000 mg | ORAL_TABLET | Freq: Three times a day (TID) | ORAL | 0 refills | Status: DC | PRN

## 2022-11-30 NOTE — Progress Notes (Incomplete)
{  Select_TRH_Note:[id number]} 

## 2022-11-30 NOTE — Discharge Summary (Signed)
Physician Discharge Summary   Patient: [name] MRN: [id number] DOB: 15-Aug-1966  Admit date:     11/21/2022  Discharge date: 11/30/22  Discharge Physician: Jennye Boroughs   PCP: [name], MD   Recommendations at discharge:   Follow-up with PCP in 1 week Follow-up with Dr. Delana Meyer, vascular surgeon in 3 weeks  Discharge Diagnoses: Principal Problem:   Gas gangrene ([place]) Active Problems:   Hypertension   Hyperglycemia due to type 2 diabetes mellitus ([place])   HLD (hyperlipidemia)   Diabetic ulcer of foot with fat layer exposed ([place])   PAD (peripheral artery disease) ([place])   AKI (acute kidney injury) ([place])   B12 deficiency   Soft tissue infection of foot   Elevated troponin   Hypokalemia  Resolved Problems:   * No resolved hospital problems. [facility] Course:  [name] is a 56 y.o. male with medical history significant non-insulin-dependent diabetes mellitus, hypertension, hyperlipidemia, chronic osteomyelitis of right foot followed at the wound care clinic, s/p right fifth ray amputation who presented to the emergency department for chief concerns of weakness and loss of appetite for 4 days. Of note he had a right fifth toe amputation due to diabetes.  He complained of foul-smelling odor at the wound site.   He was diagnosed with diabetic foot infection with necrotizing fasciitis of the right leg.  He was treated with empiric IV antibiotics.  He underwent right BKA on 11/22/2022 followed by right AKA on 11/12/2022.  Antihypertensives have been discontinued because of recent hypotension.  Multiple blood pressure readings on the day of discharge did not show any hypotension. His condition has improved and he is deemed stable for discharge to home today.  Discharge plan was discussed with the patient and his wife and they agree with the plan.     Assessment and Plan:   Right lower extremity gas gangrene, diabetic foot infection with necrotizing fasciitis  of right leg: S/p right BKA on 11/22/2022, s/p right AKA on 11/24/2022. He is no longer on antibiotics.  PT recommends home health therapy.   Type II DM with hyperglycemia: Continue semaglutide, Jardiance and glipizide at discharge.     Acute blood loss anemia: H&H is stable.  S/p transfusion with 1 units of PRBCs on 12/15 and 12/17.     Recurrent hypotension: Resolved.  BP has been stable off of midodrine.  Patient advised to discontinue antihypertensives at discharge.  However, close follow-up with PCP is recommended.  Cortisol level was 10.4.    Hypokalemia, hypophosphatemia: Improved   Elevated troponin: This was secondary to demand ischemia   AKI: Resolved   Other comorbidities include hyperlipidemia, hypertension         Pain control - [place] Controlled Substance Reporting System database was reviewed. and patient was instructed, not to drive, operate heavy machinery, perform activities at heights, swimming or participation in water activities or provide baby-sitting services while on Pain, Sleep and Anxiety Medications; until their outpatient Physician has advised to do so again. Also recommended to not to take more than prescribed Pain, Sleep and Anxiety Medications.  Consultants: Vascular surgeon, podiatrist Procedures performed: Right open BKA on 11/22/2022 Right above-the-knee amputation on 11/24/2022  Disposition: Home health Diet recommendation:  Discharge Diet Orders (From admission, onward)     Start     Ordered   11/30/22 0000  Diet - low sodium heart healthy        11/30/22 1539   11/30/22 0000  Diet Carb Modified  11/30/22 1539           Cardiac and Carb modified diet DISCHARGE MEDICATION: Allergies as of 11/30/2022   No Known Allergies      Medication List     STOP taking these medications    amLODipine 5 MG tablet Commonly known as: NORVASC   doxycycline 100 MG capsule Commonly known as: VIBRAMYCIN   Rybelsus 14 MG  Tabs Generic drug: Semaglutide   Santyl 250 UNIT/GM ointment Generic drug: collagenase   torsemide 10 MG tablet Commonly known as: DEMADEX   valsartan-hydrochlorothiazide 320-25 MG tablet Commonly known as: DIOVAN-HCT       TAKE these medications    Accu-Chek Softclix Lancets lancets USE TO TEST BLOOD SUGAR UP TO FOUR TIMES DAILY AS DIRECTED   acetaminophen 325 MG tablet Commonly known as: TYLENOL Take 2 tablets (650 mg total) by mouth every 6 (six) hours as needed for mild pain, fever or headache.   empagliflozin 25 MG Tabs tablet Commonly known as: Jardiance Take 1 tablet (25 mg total) by mouth daily.   glipiZIDE 5 MG 24 hr tablet Commonly known as: GLUCOTROL XL TAKE 1 TABLET(5 MG) BY MOUTH DAILY WITH BREAKFAST   oxyCODONE 5 MG immediate release tablet Commonly known as: Oxy IR/ROXICODONE Take 1 tablet (5 mg total) by mouth every 8 (eight) hours as needed for moderate pain.   prednisoLONE acetate 1 % ophthalmic suspension Commonly known as: PRED FORTE 1 drop every 4 (four) hours.   rosuvastatin 40 MG tablet Commonly known as: CRESTOR TAKE 1 TABLET(40 MG) BY MOUTH DAILY   tadalafil 20 MG tablet Commonly known as: CIALIS Take 1 tablet (20 mg total) by mouth daily as needed for erectile dysfunction.               Durable Medical Equipment  (From admission, onward)           Start     Ordered   11/28/22 0958  For home use only DME 3 n 1  Once        11/28/22 0957              Discharge Care Instructions  (From admission, onward)           Start     Ordered   11/30/22 0000  Discharge wound care:       Comments: Avoid laying or sitting for too long.  Apply Mepilex border to buttock wound   11/30/22 1539            Follow-up Information     Schnier, Dolores Lory, MD. Schedule an appointment as soon as possible for a visit in 3 week(s).   Specialties: Vascular Surgery, Cardiology, Radiology, Vascular Surgery Contact  information: [place] [address] [phone]                Discharge Exam: Danley Danker Weights   11/21/22 1743 11/22/22 0447 11/24/22 0847  Weight: 85.7 kg 75.3 kg 75.3 kg   GEN: NAD SKIN: No rash EYES: EOMI ENT: MMM CV: RRR PULM: CTA B ABD: soft, ND, NT, +BS CNS: AAO x 3, non focal EXT: No edema or tenderness.  Right AKA stump looks clean and dry   Condition at discharge: good  The results of significant diagnostics from this hospitalization (including imaging, microbiology, ancillary and laboratory) are listed below for reference.   Imaging Studies: CT EXTREMITY LOWER RIGHT W CONTRAST  Result Date: 11/22/2022 CLINICAL DATA:  Osteomyelitis suspected, foot,  xray done gas gangrene at knee on xray, please scan from foot to pelvis. Thank you. EXAM: CT OF THE LOWER RIGHT EXTREMITY WITH CONTRAST TECHNIQUE: Multidetector CT imaging of the lower right extremity was performed according to the standard protocol following intravenous contrast administration. RADIATION DOSE REDUCTION: This exam was performed according to the departmental dose-optimization program which includes automated exposure control, adjustment of the mA and/or kV according to patient size and/or use of iterative reconstruction technique. CONTRAST:  171m OMNIPAQUE IOHEXOL 300 MG/ML  SOLN COMPARISON:  Tibia/fibula and foot radiographs earlier today. FINDINGS: Imaging obtained from the hip to the foot. Bones/Joint/Cartilage Patchy areas of decreased density and erosions throughout the tarsal bones. There are erosive changes in the cuboid, all cuneiform so and to a lesser extent navicular. There is intra osseous air within the navicular. Patchy air within the intertarsal joints and subtalar joint the foot. Previous resection of the fifth ray. There is mild smooth periosteal thickening about the distal tibia and fibula.Well corticated defect of the lateral femoral condyle at the knee joint  appears chronic. Ligaments Suboptimally assessed by CT. Muscles and Tendons Diffuse intramuscular air and fluid collections throughout the foot. The largest air-fluid collection is in the dorsum of the foot, 6.3 x 2.6 cm series 2, image 653. Intramuscular gas tracks in the anterior compartment of the lower leg to the level of the proximal tibia/fibular articulation. No intramuscular gas proximal to the knee. Soft tissues Circumferential subcutaneous edema about the distal aspect of the lower leg. There is subcutaneous edema and skin thickening throughout the foot. Patchy soft tissue gas throughout the foot with a large plantar soft tissue defect extending to the cuboid. IMPRESSION: 1. Plantar soft tissue defect in the foot extending to the cuboid with osteomyelitis. Osteomyelitis involves the tarsal bones, cuboid, all cuneiforms and navicular. There is also intraosseous air in the navicular. 2. Extensive soft tissue and intramuscular gas extending from the level of the plantar wound throughout the foot. Heterogeneous air-fluid collections throughout the foot with dominant collection dorsally. Intra-articular air throughout the midfoot and subtalar joint. Findings consistent with gas gangrene and possible necrotizing soft tissue infection. 3. Soft tissue and intramuscular gas tracks proximally in the anterior compartment to the level of the proximal tibia/fibular articulation. Smooth periosteal thickening of the mid and distal tibia and fibula have a chronic appearance. These results were called by telephone at the time of interpretation on 11/22/2022 at 12:50 am to provider NP KJerrye Bushy, who verbally acknowledged these results. Electronically Signed   By: MKeith RakeM.D.   On: 11/22/2022 00:51   DG Tibia/Fibula Right  Result Date: 11/21/2022 CLINICAL DATA:  Evaluate gas gangrene in right lower extremity, initial encounter EXAM: RIGHT TIBIA AND FIBULA - 2 VIEW COMPARISON:  Foot films from earlier in the  same day. FINDINGS: There is extensive air identified in the subcutaneous tissues along the lateral aspect of the lower leg extending from the foot. These extend to the level of the knee joint. No bony erosive changes are identified. No definitive erosive changes are seen in the tibia and fibula. Stable changes in the foot are noted similar to that seen earlier on the same day. IMPRESSION: Progression of subcutaneous air to the level of the knee joint. No abnormality involving the tibia and fibula is seen. Electronically Signed   By: MInez CatalinaM.D.   On: 11/21/2022 21:23   DG Foot Complete Right  Result Date: 11/21/2022 CLINICAL DATA:  Osteomyelitis EXAM: RIGHT FOOT COMPLETE -  3+ VIEW COMPARISON:  None Available. FINDINGS: Right fifth ray amputation has been performed. There is extensive soft tissue swelling of the right foot and extensive subcutaneous gas noted within the dorsum of the right forefoot and midfoot. There is, additionally, subcutaneous gas and erosive changes involving the third metatarsophalangeal joint in keeping with osteomyelitis/septic arthritis in this location. There is periosteal reaction involving the base of the fourth metatarsal as well as periarticular lucencies involving the cuboid and fourth metatarsal suspicious for changes of osteomyelitis in these locations. Advanced vascular calcifications noted. No fracture. IMPRESSION: 1. Extensive soft tissue swelling and subcutaneous gas within the dorsum of the right forefoot and midfoot. Correlation for aggressive soft tissue infection is recommended. 2. Findings in keeping with osteomyelitis/septic arthritis involving the third metatarsophalangeal joint. 3. Suspected periarticular osteomyelitis involving the base of the fourth metatarsal and cuboid. 4. Right fifth ray amputation. Electronically Signed   By: Fidela Salisbury M.D.   On: 11/21/2022 20:05    Microbiology: Results for orders placed or performed during the hospital  encounter of 11/21/22  Culture, blood (Routine X 2) w Reflex to ID Panel     Status: None   Collection Time: 11/21/22  9:26 PM   Specimen: BLOOD  Result Value Ref Range Status   Specimen Description BLOOD LEFT HAND  Final   Special Requests   Final    BOTTLES DRAWN AEROBIC AND ANAEROBIC Blood Culture results may not be optimal due to an inadequate volume of blood received in culture bottles   Culture   Final    NO GROWTH 5 DAYS Performed at [facility], [address], [place], [place] [address]    Report Status 11/26/2022 FINAL  Final  Culture, blood (Routine X 2) w Reflex to ID Panel     Status: None   Collection Time: 11/21/22  9:47 PM   Specimen: BLOOD  Result Value Ref Range Status   Specimen Description BLOOD RIGHT ASSIST CONTROL  Final   Special Requests   Final    BOTTLES DRAWN AEROBIC AND ANAEROBIC Blood Culture adequate volume   Culture   Final    NO GROWTH 5 DAYS Performed at [facility], [address] [place], [place] [address]    Report Status 11/26/2022 FINAL  Final  Aerobic/Anaerobic Culture w Gram Stain (surgical/deep wound)     Status: None   Collection Time: 11/22/22  1:38 AM   Specimen: PATH Other; Tissue  Result Value Ref Range Status   Specimen Description   Final    WOUND RIGHT LEG Performed at [facility], [address], [place], [place] [address]    Special Requests   Final    NONE Performed at [facility], [address] [place], [place] [address]    Gram Stain   Final    NO WBC SEEN ABUNDANT GRAM POSITIVE COCCI IN PAIRS RARE GRAM POSITIVE COCCI IN CHAINS Performed at [facility], [place] [address] [place], [place] [address]    Culture   Final    RARE ESCHERICHIA COLI ABUNDANT GROUP B STREP(S.AGALACTIAE)ISOLATED TESTING AGAINST S. AGALACTIAE NOT ROUTINELY PERFORMED DUE TO PREDICTABILITY OF AMP/PEN/VAN SUSCEPTIBILITY. MIXED ANAEROBIC FLORA PRESENT.  CALL LAB IF FURTHER IID REQUIRED.    Report  Status 11/27/2022 FINAL  Final   Organism ID, Bacteria ESCHERICHIA COLI  Final      Susceptibility   Escherichia coli - MIC*    AMPICILLIN >=32 RESISTANT Resistant     CEFAZOLIN <=4 SENSITIVE Sensitive  CEFEPIME <=0.12 SENSITIVE Sensitive     CEFTAZIDIME <=1 SENSITIVE Sensitive     CEFTRIAXONE <=0.25 SENSITIVE Sensitive     CIPROFLOXACIN <=0.25 SENSITIVE Sensitive     GENTAMICIN <=1 SENSITIVE Sensitive     IMIPENEM <=0.25 SENSITIVE Sensitive     TRIMETH/SULFA >=320 RESISTANT Resistant     AMPICILLIN/SULBACTAM 16 INTERMEDIATE Intermediate     PIP/TAZO <=4 SENSITIVE Sensitive     * RARE ESCHERICHIA COLI    Labs: CBC: Recent Labs  Lab 11/24/22 1232 11/25/22 0511 11/26/22 0451 11/27/22 0310 11/29/22 0609 11/30/22 0521  WBC 10.5 11.9* 11.3* 13.4* 15.0* 11.5*  NEUTROABS 9.5* 10.0* 9.3* 11.0*  --  9.3*  HGB 6.6* 8.2* 6.8* 8.7* 7.3* 7.7*  HCT 20.9* 25.0* 20.7* 26.1* 22.6* 23.5*  MCV 89.3 87.4 87.0 86.7 89.3 89.7  PLT 248 230 223 226 225 544   Basic Metabolic Panel: Recent Labs  Lab 11/24/22 1232 11/25/22 0511 11/26/22 0451 11/27/22 0310 11/28/22 0449 11/29/22 0609  NA 140 138 135 135 138 137  K 3.5 3.3* 3.7 3.7 3.8 3.8  CL 109 111 105 104 108 105  CO2 19* '24 27 25 27 27  '$ GLUCOSE 245* 164* 237* 205* 133* 201*  BUN 26* 22* 24* 23* 22* 22*  CREATININE 0.92 0.81 0.88 0.81 0.69 0.78  CALCIUM 8.6* 8.8* 8.4* 8.4* 8.1* 7.8*  MG 1.8 2.3 2.1  --   --  2.0  PHOS 2.0* 2.2* 1.8*  --  2.4* 2.9   Liver Function Tests: Recent Labs  Lab 11/24/22 1232 11/25/22 0511  AST 14* 19  ALT 8 9  ALKPHOS 59 62  BILITOT 1.3* 1.2  PROT 5.5* 5.8*  ALBUMIN <1.5* 1.7*   CBG: Recent Labs  Lab 11/29/22 1622 11/29/22 2135 11/30/22 0250 11/30/22 0800 11/30/22 1119  GLUCAP 271* 201* 170* 152* 166*    Discharge time spent: greater than 30 minutes.  Signed: Jennye Boroughs, MD [facility] 11/30/2022

## 2022-11-30 NOTE — Progress Notes (Signed)
Occupational Therapy Treatment Patient Details Name: [name] MRN: [id number] DOB: 21-Jun-1966 Today's Date: 11/30/2022   History of present illness Pt is a 56 year old male s/p R BKA 11/22/22,  AKA 11/24/22; PMH significant for non-insulin-dependent diabetes mellitus, hypertension, hyperlipidemia   OT comments  Chart reviewed, per team pt is to dc today, refusing short term rehab, going home with [facility] therapy. Tx session targeted ensuring pt and wife are able to perform ADL skills safely at home to prevent readmission. Attempted to simulate 3 in 1 bsc transfer (pt likely has to private pay for a drop arm), with pt requiring MAX A, requesting pain meds. Pt has mwc to use in the house per pt and wife report.Educated on use of urinal if needed, importance of functional strengthening of BUE for safe transfers. Educated again (extensive education with PT) re; residual limb care. Pt and wife endorse they want to dc home today with [facility]. OT will continue to follow acutely.    Recommendations for follow up therapy are one component of a multi-disciplinary discharge planning process, led by the attending physician.  Recommendations may be updated based on patient status, additional functional criteria and insurance authorization.    Follow Up Recommendations  Skilled nursing-short term rehab (<3 hours/day) (pt is refusing rehab, recommend [place])     Assistance Recommended at Discharge Frequent or constant Supervision/Assistance  Patient can return home with the following  A lot of help with walking and/or transfers;A lot of help with bathing/dressing/bathroom;Assistance with cooking/housework;Assist for transportation;Help with stairs or ramp for entrance   Equipment Recommendations  BSC/3in1;Tub/shower bench    Recommendations for Other Services      Precautions / Restrictions Precautions Precautions: Fall Precaution Comments: R AKA Restrictions Weight Bearing Restrictions: No        Mobility Bed Mobility Overal bed mobility: Needs Assistance Bed Mobility: Supine to Sit, Sit to Supine     Supine to sit: Modified independent (Device/Increase time) Sit to supine: Modified independent (Device/Increase time)        Transfers                         Balance Overall balance assessment: Needs assistance Sitting-balance support: Feet supported Sitting balance-Leahy Scale: Good                                     ADL either performed or assessed with clinical judgement   ADL Overall ADL's : Needs assistance/impaired                                       General ADL Comments: SET UP for grooming tasks, attempted STS to simulate bsc transfer to traditional 3 in 1 bsc, requiring MAX A for attempt to stand; step by step vcs throughout    Extremity/Trunk Assessment              Vision       Perception     Praxis      Cognition Arousal/Alertness: Awake/alert Behavior During Therapy: Flat affect Overall Cognitive Status: Within Functional Limits for tasks assessed Area of Impairment: Awareness, Problem solving, Following commands                       Following Commands: Follows one step commands  with increased time   Awareness: Emergent Problem Solving: Slow processing          Exercises Other Exercises Other Exercises: edu re: safe transfers, DME use, importance of limb care    Shoulder Instructions       General Comments Discussion re: importance of functional strengthening for standing, eventual prosthetic use, reports he has a follow up with the eye doctor in February, ?if he could see eye doctor sooner    Pertinent Vitals/ Pain       Pain Assessment Pain Assessment: 0-10 Pain Score: 10-Worst pain ever Pain Location: R LE Pain Descriptors / Indicators: Aching, Grimacing, Discomfort Pain Intervention(s): Limited activity within patient's tolerance, Monitored during session,  Repositioned, Patient requesting pain meds-RN notified  Home Living                                          Prior Functioning/Environment              Frequency  Min 2X/week        Progress Toward Goals  OT Goals(current goals can now be found in the care plan section)  Progress towards OT goals: Progressing toward goals     Plan Discharge plan remains appropriate    Co-evaluation                 AM-PAC OT "6 Clicks" Daily Activity     Outcome Measure   Help from another person eating meals?: None Help from another person taking care of personal grooming?: None Help from another person toileting, which includes using toliet, bedpan, or urinal?: A Lot Help from another person bathing (including washing, rinsing, drying)?: A Lot Help from another person to put on and taking off regular upper body clothing?: A Little Help from another person to put on and taking off regular lower body clothing?: A Lot 6 Click Score: 17    End of Session Equipment Utilized During Treatment: Rolling walker (2 wheels)  OT Visit Diagnosis: Unsteadiness on feet (R26.81);Muscle weakness (generalized) (M62.81)   Activity Tolerance Patient tolerated treatment well   Patient Left with call bell/phone within reach;with bed alarm set;in bed   Nurse Communication Mobility status        Time: 3545-6256 OT Time Calculation (min): 12 min  Charges: OT General Charges $OT Visit: 1 Visit OT Treatments $Self Care/Home Management : 8-22 mins  Shanon Payor, OTD OTR/L  11/30/22, 4:01 PM

## 2022-11-30 NOTE — Plan of Care (Signed)

## 2022-11-30 NOTE — Progress Notes (Signed)
Physical Therapy Treatment Patient Details Name: [name] MRN: [id number] DOB: 07-09-1966 Today's Date: 11/30/2022   History of Present Illness Pt is a 56 year old male s/p R BKA 11/22/22,  AKA 11/24/22; PMH significant for non-insulin-dependent diabetes mellitus, hypertension, hyperlipidemia    PT Comments    Pt was long sitting in bed upon arriving. He is alert and cooperative with supportive spouse present throughout." He's going home today."(Per spouse) Chief Strategy Officer did confirm with MD and staff that he is being Dc'd home today. Session was spent educating pt/pt's spouse on limb wrapping, importance of positioning and there ex to promote return in abilities while preparing for prosthesis later in recovery. Author has pt's spouse wrap limb one time and encouraged her to continue to practice. Reissued HEP handout with education on importance of hip extension to prevent contractures. Both pt and spouse state understanding and state confidence in upcoming DC to home. Pt has been refusing rehab and is planning to DC home with HHPT to follow. He will continue to benefit form skilled PT to maximize independence while decreasing caregiver burden.    Recommendations for follow up therapy are one component of a multi-disciplinary discharge planning process, led by the attending physician.  Recommendations may be updated based on patient status, additional functional criteria and insurance authorization.  Follow Up Recommendations  Home health PT        Patient can return home with the following A little help with walking and/or transfers;A little help with bathing/dressing/bathroom;Assistance with cooking/housework;Assistance with feeding;Direct supervision/assist for medications management;Direct supervision/assist for financial management;Assist for transportation;Help with stairs or ramp for entrance   Equipment Recommendations  Other (comment) (requesting drop arm)       Precautions /  Restrictions Precautions Precautions: Fall Precaution Comments: R AKA Restrictions Weight Bearing Restrictions: No     Mobility  Bed Mobility Overal bed mobility: Needs Assistance Bed Mobility: Supine to Sit, Sit to Supine  Supine to sit: Modified independent (Device/Increase time) Sit to supine: Modified independent (Device/Increase time)   General bed mobility comments: pt was able to achieve EOB short sit without physical assistance. Supportive spouse present throughout session.    Transfers  General transfer comment: pt unwilling to get OOB. Most of session spent on education on ace wrapping, exercises, and expectations going forward. Author demonstrated and then had pt's spouse perform wrapping R limb.      Balance Overall balance assessment: Needs assistance Sitting-balance support: Feet supported Sitting balance-Leahy Scale: Good     Standing balance comment: not formally tested       Cognition Arousal/Alertness: Awake/alert Behavior During Therapy: WFL for tasks assessed/performed Overall Cognitive Status: Within Functional Limits for tasks assessed      Following Commands: Follows one step commands consistently, Follows one step commands with increased time       General Comments: Pt is A and O x 3. Author questions if pt truely understands scope of current situation           General Comments General comments (skin integrity, edema, etc.): Most of session spent on education on expectation and discussing how to manage at home until [facility] services start. They stated confidence in safe DC home this afternoon.      Pertinent Vitals/Pain Pain Assessment Pain Assessment: 0-10 Pain Score: 3  Pain Location: R LE Pain Descriptors / Indicators: Aching Pain Intervention(s): Limited activity within patient's tolerance, Monitored during session, Premedicated before session, Repositioned     PT Goals (current goals can now be  found in the care plan section) Acute  Rehab PT Goals Patient Stated Goal: get better Progress towards PT goals: Progressing toward goals    Frequency    7X/week      PT Plan Current plan remains appropriate       AM-PAC PT "6 Clicks" Mobility   Outcome Measure  Help needed turning from your back to your side while in a flat bed without using bedrails?: A Little Help needed moving from lying on your back to sitting on the side of a flat bed without using bedrails?: A Little Help needed moving to and from a bed to a chair (including a wheelchair)?: A Little Help needed standing up from a chair using your arms (e.g., wheelchair or bedside chair)?: A Little Help needed to walk in hospital room?: A Lot Help needed climbing 3-5 steps with a railing? : Total 6 Click Score: 15    End of Session   Activity Tolerance: Patient tolerated treatment well Patient left: in bed;with call bell/phone within reach;with bed alarm set Nurse Communication: Mobility status PT Visit Diagnosis: Other abnormalities of gait and mobility (R26.89);Difficulty in walking, not elsewhere classified (R26.2);Muscle weakness (generalized) (M62.81)     Time: 1420-1440 PT Time Calculation (min) (ACUTE ONLY): 20 min  Charges:  $Therapeutic Exercise: 8-22 mins                     Julaine Fusi PTA 11/30/22, 3:43 PM

## 2022-11-30 NOTE — Inpatient Diabetes Management (Signed)
Inpatient Diabetes Program Recommendations  AACE/ADA: New Consensus Statement on Inpatient Glycemic Control   Target Ranges:  Prepandial:   less than 140 mg/dL      Peak postprandial:   less than 180 mg/dL (1-2 hours)      Critically ill patients:  140 - 180 mg/dL    Latest Reference Range & Units 11/29/22 07:25 11/29/22 12:22 11/29/22 16:22 11/29/22 21:35  Glucose-Capillary 70 - 99 mg/dL 209 (H)  Novolog 3 units 271 (H)  Novolog 5 units 271 (H)  Novolog 8 units 201 (H)  Novolog 2 units    Latest Reference Range & Units 11/30/22 02:50 11/30/22 08:00  Glucose-Capillary 70 - 99 mg/dL 170 (H) 152 (H)   Review of Glycemic Control  Diabetes history: DM2 Outpatient Diabetes medications: Jardiance 25 mg Daily, Glipizide 5 mg Daily, Rybelsus 14 mg Daily (NOT taking) Current orders for Inpatient glycemic control: Semglee 12 units QHS, Novolog 0-15 units TID with meals, Novolog 0-5 units QHS  Inpatient Diabetes Program Recommendations:    Insulin: Post prandial glucose is consistently elevated. Please consider ordering Novolog 4 units TID with meals for meal coverage if patient eats at least 50% of meals.  Thanks, Barnie Alderman, RN, MSN, [place] Diabetes Coordinator Inpatient Diabetes Program [phone] (Team Pager from 8am to [place])

## 2022-11-30 NOTE — TOC Transition Note (Signed)
Transition of Care [facility]) - CM/SW Discharge Note   Patient Details  Name: [name] MRN: [id number] Date of Birth: 07-03-1966  [facility]) CM/SW Contact:  Tiburcio Bash, LCSW Phone Number: 11/30/2022, 3:43 PM   Clinical Narrative:     Patient to dc home with home health PT and OT with [facility], Meg informed of dc today. Patient has all dme per spouse. EMS forms on chart, [facility] has been called.   No further needs.   Final next level of care: [place] Barriers to Discharge: No Barriers Identified   Patient Goals and CMS Choice Patient states their goals for this hospitalization and ongoing recovery are:: to go home CMS Medicare.gov Compare Post Acute Care list provided to:: Patient Represenative (must comment) (spouse) Choice offered to / list presented to : Spouse    Discharge Placement                       Discharge Plan and Services                DME Arranged: 3-N-1 DME Agency: [employer]       HH Arranged: PT, OT HH Agency: [place] Date [place]: 11/30/22 Time [place]: 2952 Representative spoke with at [place]: Meg  Social Determinants of Health (SDOH) Interventions Food Insecurity Interventions: Inpatient TOC Utilities Interventions: Inpatient TOC   Readmission Risk Interventions     No data to display

## 2022-12-01 ENCOUNTER — Telehealth: Payer: Self-pay | Admitting: Family Medicine

## 2022-12-01 NOTE — Telephone Encounter (Signed)
Appt switched to hospital fu. Tried to contact pt vm not setup to schedule his regular 73mfu appt

## 2022-12-01 NOTE — Telephone Encounter (Signed)
Copied from [place] [id number]. Topic: General - Other >> Dec 01, 2022  9:02 AM [place] B wrote: Reason for CRM: Geralynn Ochs from [facility], called in to relay patient discharged from [facility] 11/30/22

## 2022-12-02 ENCOUNTER — Other Ambulatory Visit: Payer: Self-pay | Admitting: Family Medicine

## 2022-12-02 DIAGNOSIS — R809 Proteinuria, unspecified: Secondary | ICD-10-CM

## 2022-12-06 ENCOUNTER — Inpatient Hospital Stay: Payer: Medicaid Other | Admitting: Family Medicine

## 2022-12-06 ENCOUNTER — Telehealth (INDEPENDENT_AMBULATORY_CARE_PROVIDER_SITE_OTHER): Payer: Self-pay | Admitting: Vascular Surgery

## 2022-12-07 ENCOUNTER — Ambulatory Visit: Payer: Self-pay

## 2022-12-07 NOTE — Telephone Encounter (Signed)
Pt's spouse spoke with [facility] this morning and was told to pt the dates of 12.12.23 - 3.07.24.

## 2022-12-07 NOTE — Telephone Encounter (Signed)
Chief Complaint: several: BP 186-190 over 101-108, BS 260 fasting, skin tear left shin Symptoms: urinary frequency Frequency: BP med and Diabetes med dc'd at hospital- asking for it to be restarted Pertinent Negatives: Patient denies fever, SOB, chest pain headache Disposition: '[]'$ ED /'[]'$ Urgent Care (no appt availability in office) / '[]'$ Appointment(In office/virtual)/ '[]'$  [place] [facility]/ '[]'$ Home Care/ '[]'$ Refused Recommended Disposition /'[]'$ [place] [facility]/ '[x]'$  Follow-up with PCP Additional Notes: Asking for home health nurse to address left shin skin tear. Has hospital f/u next week. - Reason for Disposition . Systolic BP  >= 500 OR Diastolic >= 938 . [1] Blood glucose > 300 mg/dL (16.7 mmol/L) AND [2] two or more times in a row . [8] Systolic BP  >= 299 OR Diastolic >= 371 AND [6] missed most recent dose of blood pressure medication  Answer Assessment - Initial Assessment Questions 1. BLOOD PRESSURE: "What is the blood pressure?" "Did you take at least two measurements 5 minutes apart?"     186/101 190/108  2. ONSET: "When did you take your blood pressure?"     this 3. HOW: "How did you take your blood pressure?" (e.g., automatic home BP monitor, visiting nurse)     PCA 4. HISTORY: "Do you have a history of high blood pressure?"     yes 5. MEDICINES: "Are you taking any medicines for blood pressure?" "Have you missed any doses recently?"     Hospital dc'd BP med and diabetes med 6. OTHER SYMPTOMS: "Do you have any symptoms?" (e.g., blurred vision, chest pain, difficulty breathing, headache, weakness)     No BS 260 7. PREGNANCY: "Is there any chance you are pregnant?" "When was your last menstrual period?"     N/a  Answer Assessment - Initial Assessment Questions 1. APPEARANCE of INJURY: "What does the injury look like?"      Skin tear 2. SIZE: "How large is the cut?"      1 inch by 1.5 inches 3. BLEEDING: "Is it bleeding now?" If Yes, ask: "Is it difficult to stop?"       no 4. LOCATION: "Where is the injury located?"      Left shin 5. ONSET: "How long ago did the injury occur?"      unsure 6. MECHANISM: "Tell me how it happened."      N/a 7. TETANUS: "When was the last tetanus booster?"     unk 8. PREGNANCY: "Is there any chance you are pregnant?" "When was your last menstrual period?"     N/a  Answer Assessment - Initial Assessment Questions 1. BLOOD GLUCOSE: "What is your blood glucose level?"      260 fasting and after eating 300 2. ONSET: "When did you check the blood glucose?"     This am  3. USUAL RANGE: "What is your glucose level usually?" (e.g., usual fasting morning value, usual evening value)     N/a 4. KETONES: "Do you check for ketones (urine or blood test strips)?" If Yes, ask: "What does the test show now?"      N/a 5. TYPE 1 or 2:  "Do you know what type of diabetes you have?"  (e.g., Type 1, Type 2, Gestational; doesn't know)      2 6. INSULIN: "Do you take insulin?" "What type of insulin(s) do you use? What is the mode of delivery? (syringe, pen; injection or pump)?"      no 7. DIABETES PILLS: "Do you take any pills for your diabetes?" If Yes, ask: "Have  you missed taking any pills recently?"     Recent surgery and 1 diabetic med was dc'd 8. OTHER SYMPTOMS: "Do you have any symptoms?" (e.g., fever, frequent urination, difficulty breathing, dizziness, weakness, vomiting)     Freq. urination 9. PREGNANCY: "Is there any chance you are pregnant?" "When was your last menstrual period?"     N/a  Protocols used: Blood Pressure - High-A-AH, Cuts and Lacerations-A-AH, Diabetes - High Blood Sugar-A-AH

## 2022-12-07 NOTE — Telephone Encounter (Signed)
Pt's wife calling to check the status of FMLA papers. Please advise.

## 2022-12-08 ENCOUNTER — Telehealth: Payer: Managed Care, Other (non HMO) | Admitting: Family Medicine

## 2022-12-08 ENCOUNTER — Encounter: Payer: Self-pay | Admitting: Family Medicine

## 2022-12-08 VITALS — Ht 71.0 in | Wt 166.1 lb

## 2022-12-08 DIAGNOSIS — Z89611 Acquired absence of right leg above knee: Secondary | ICD-10-CM

## 2022-12-08 DIAGNOSIS — Z8739 Personal history of other diseases of the musculoskeletal system and connective tissue: Secondary | ICD-10-CM

## 2022-12-08 DIAGNOSIS — E118 Type 2 diabetes mellitus with unspecified complications: Secondary | ICD-10-CM | POA: Diagnosis not present

## 2022-12-08 DIAGNOSIS — Z09 Encounter for follow-up examination after completed treatment for conditions other than malignant neoplasm: Secondary | ICD-10-CM

## 2022-12-08 DIAGNOSIS — I10 Essential (primary) hypertension: Secondary | ICD-10-CM

## 2022-12-08 MED ORDER — AMLODIPINE BESYLATE 5 MG PO TABS: 5.0000 mg | ORAL_TABLET | Freq: Two times a day (BID) | ORAL | 0 refills | Status: DC

## 2022-12-08 MED ORDER — VALSARTAN-HYDROCHLOROTHIAZIDE 320-25 MG PO TABS: 1.0000 | ORAL_TABLET | Freq: Every day | ORAL | 0 refills | Status: DC

## 2022-12-08 NOTE — Telephone Encounter (Signed)
Pt has virtual appt with you today and a hospital fu scheduled for 1.3.2024

## 2022-12-08 NOTE — Telephone Encounter (Signed)
Spoke with pt and he is wheelchair bound and does not have transportation for today to come into the office. Miel told me to have him have all his bp readings when you call.

## 2022-12-08 NOTE — Telephone Encounter (Signed)
Forms completed

## 2022-12-08 NOTE — Telephone Encounter (Signed)
It says its for bp management. Do you want him to come in office?

## 2022-12-08 NOTE — Progress Notes (Signed)
Name: [name]   MRN: [id number]    DOB: 1966/08/03   Date:12/08/2022       Progress Note  Subjective  Chief Complaint  BP Medication  I connected with  [name]  on 12/08/22 at 11:40 AM EST by a video enabled telemedicine application and verified that I am speaking with the correct person using two identifiers.  I discussed the limitations of evaluation and management by telemedicine and the availability of in person appointments. The patient expressed understanding and agreed to proceed with the virtual visit  Staff also discussed with the patient that there may be a patient responsible charge related to this service. Patient Location: at home  Provider Location: [facility] Additional Individuals present: wife   HPI  Admitted to [facility] on 11/21/2022 Discharge home on 11/30/2022   He went to [facility] on 12/12 due to increase in fatigue and lack of appetite , he had also noticed an odor on the site of previous 5th finger amputation back in 2022. He was diagnosed with diabetic foot infection with necrotizing fasciitis. He was given IV antibiotics He had right BKA on 11/22/2022   Medication reconciliation was done His bp was stopped during hospital hypotension, but since he has been home his PT checked his bp and was elevated and advised to check with [place].   Today his bp is elevated, they checked multiple times and has been between 150-166/91-98, he resumed taking bp medication yesterday but bp is still elevated today, he states prior to admission his bp used to be controlled  Diabetes type II: glucose has been elevated at home 190's fasting today but usually not that elevated,  taking medication as prescribed , before admission his glucose was normal During his stay A1C was 7.3 %   He had a blood transfusion during his stay due to acute blood loss  Acute kidney insufficiency resolved before discharge   Patient Active Problem List   Diagnosis Date Noted   Soft tissue infection of foot  11/21/2022   Elevated troponin 11/21/2022   Gas gangrene ([place]) 11/21/2022   Hypokalemia 11/21/2022   B12 deficiency 09/05/2022   Proliferative diabetic retinopathy of both eyes without macular edema associated with type 2 diabetes mellitus ([place]) 06/05/2022   PAD (peripheral artery disease) ([place]) 04/12/2021   AKI (acute kidney injury) ([place]) 04/12/2021   Secondary renal hyperparathyroidism ([place]) 03/19/2019   Diabetic ulcer of foot with fat layer exposed ([place]) 12/18/2018   Dyslipidemia associated with type 2 diabetes mellitus ([place]) 07/03/2018   Vitamin D deficiency 04/11/2018   ED (erectile dysfunction) 04/10/2018   Lower urinary tract symptoms (LUTS) 02/09/2017   Hypertension 07/05/2015   Hyperglycemia due to type 2 diabetes mellitus ([place]) 07/05/2015   HLD (hyperlipidemia) 07/05/2015    Past Surgical History:  Procedure Laterality Date   AMPUTATION Right 04/16/2021   Procedure: AMPUTATION  RIGHT 5TH RAY, INCISION AND DRAINAGE;  Surgeon: Edrick Kins, DPM;  Location: [facility] ORS;  Service: Podiatry;  Laterality: Right;   AMPUTATION Right 11/24/2022   Procedure: AMPUTATION ABOVE KNEE;  Surgeon: Katha Cabal, MD;  Location: [facility] ORS;  Service: Vascular;  Laterality: Right;   APPLICATION OF WOUND VAC Right 04/21/2021   Procedure: APPLICATION OF WOUND VAC;  Surgeon: Edrick Kins, DPM;  Location: [facility] ORS;  Service: Podiatry;  Laterality: Right;   BONE BIOPSY Left 06/27/2020   Procedure: BONE BIOPSY;  Surgeon: Caroline More, DPM;  Location: [facility] ORS;  Service: Podiatry;  Laterality: Left;  COLONOSCOPY WITH PROPOFOL N/A 06/22/2022   Procedure: COLONOSCOPY WITH PROPOFOL;  Surgeon: [name], MD;  Location: [facility] ENDOSCOPY;  Service: Gastroenterology;  Laterality: N/A;   I & D EXTREMITY Right 11/22/2022   Procedure: IRRIGATION AND DEBRIDEMENT EXTREMITY, below knee amputation right leg;  Surgeon: Jules Husbands, MD;  Location: [facility] ORS;  Service: General;  Laterality: Right;   INCISION AND  DRAINAGE Left 06/27/2020   Procedure: INCISION AND DRAINAGE;  Surgeon: Caroline More, DPM;  Location: [facility] ORS;  Service: Podiatry;  Laterality: Left;   IRRIGATION AND DEBRIDEMENT FOOT Right 04/21/2021   Procedure: IRRIGATION AND DEBRIDEMENT FOOT;  Surgeon: Edrick Kins, DPM;  Location: [facility] ORS;  Service: Podiatry;  Laterality: Right;   LOWER EXTREMITY ANGIOGRAPHY Left 06/30/2020   Procedure: Lower Extremity Angiography;  Surgeon: Algernon Huxley, MD;  Location: [place] CV LAB;  Service: Cardiovascular;  Laterality: Left;   LOWER EXTREMITY ANGIOGRAPHY Right 04/14/2021   Procedure: Lower Extremity Angiography;  Surgeon: Algernon Huxley, MD;  Location: [place] CV LAB;  Service: Cardiovascular;  Laterality: Right;   NO PAST SURGERIES     WOUND DEBRIDEMENT Right 04/19/2021   Procedure: DEBRIDEMENT WOUND;  Surgeon: Edrick Kins, DPM;  Location: [facility] ORS;  Service: Podiatry;  Laterality: Right;    Family History  Problem Relation Age of Onset   Heart disease Father    Heart attack Father    Healthy Mother    Prostate cancer Neg Hx    Kidney cancer Neg Hx    Bladder Cancer Neg Hx     Social History   Socioeconomic History   Marital status: Married    Spouse name: [name]    Number of children: 1   Years of education: Not on file   Highest education level: Not on file  Occupational History   Not on file  Tobacco Use   Smoking status: Never   Smokeless tobacco: Never  Vaping Use   Vaping Use: Never used  Substance and Sexual Activity   Alcohol use: Yes    Alcohol/week: 6.0 - 12.0 standard drinks of alcohol    Types: 6 - 12 Cans of beer per week    Comment: Occasional   Drug use: No   Sexual activity: Yes  Other Topics Concern   Not on file  Social History Narrative   Not on file   Social Determinants of Health   Financial Resource Strain: Low Risk  (07/02/2019)   Overall Financial Resource Strain (CARDIA)    Difficulty of Paying Living Expenses: Not hard at all  Food  Insecurity: Food Insecurity Present (11/22/2022)   Hunger Vital Sign    Worried About Running Out of Food in the Last Year: Sometimes true    Ran Out of Food in the Last Year: Never true  Transportation Needs: No Transportation Needs (11/22/2022)   PRAPARE - Hydrologist (Medical): No    Lack of Transportation (Non-Medical): No  Recent Concern: Transportation Needs - Unmet Transportation Needs (11/22/2022)   PRAPARE - Hydrologist (Medical): Not on file    Lack of Transportation (Non-Medical): Yes  Physical Activity: Insufficiently Active (07/02/2019)   Exercise Vital Sign    Days of Exercise per Week: 4 days    Minutes of Exercise per Session: 30 min  Stress: No Stress Concern Present (07/02/2019)   [place]    Feeling of Stress : Not at  all  Social Connections: Not on file  Intimate Partner Violence: Not At Risk (11/22/2022)   Humiliation, Afraid, Rape, and Kick questionnaire    Fear of Current or Ex-Partner: No    Emotionally Abused: No    Physically Abused: No    Sexually Abused: No     Current Outpatient Medications:    Accu-Chek Softclix Lancets lancets, USE TO TEST BLOOD SUGAR UP TO FOUR TIMES DAILY AS DIRECTED, Disp: 300 each, Rfl: 1   acetaminophen (TYLENOL) 325 MG tablet, Take 2 tablets (650 mg total) by mouth every 6 (six) hours as needed for mild pain, fever or headache., Disp: , Rfl:    amLODipine (NORVASC) 5 MG tablet, Take 1 tablet (5 mg total) by mouth in the morning and at bedtime., Disp: 30 tablet, Rfl: 0   empagliflozin (JARDIANCE) 25 MG TABS tablet, Take 1 tablet (25 mg total) by mouth daily., Disp: 90 tablet, Rfl: 1   glipiZIDE (GLUCOTROL XL) 5 MG 24 hr tablet, TAKE 1 TABLET(5 MG) BY MOUTH DAILY WITH BREAKFAST, Disp: 90 tablet, Rfl: 1   oxyCODONE (OXY IR/ROXICODONE) 5 MG immediate release tablet, Take 1 tablet (5 mg total) by mouth every 8  (eight) hours as needed for moderate pain., Disp: 12 tablet, Rfl: 0   prednisoLONE acetate (PRED FORTE) 1 % ophthalmic suspension, 1 drop every 4 (four) hours., Disp: , Rfl:    rosuvastatin (CRESTOR) 40 MG tablet, TAKE 1 TABLET(40 MG) BY MOUTH DAILY, Disp: 90 tablet, Rfl: 1   tadalafil (CIALIS) 20 MG tablet, Take 1 tablet (20 mg total) by mouth daily as needed for erectile dysfunction., Disp: 30 tablet, Rfl: 6   valsartan-hydrochlorothiazide (DIOVAN-HCT) 320-25 MG tablet, Take 1 tablet by mouth daily., Disp: 90 tablet, Rfl: 0  No Known Allergies  I personally reviewed active problem list, medication list, allergies, family history, social history, health maintenance with the patient/caregiver today.   ROS  Ten systems reviewed and is negative except as mentioned in HPI   Objective  Virtual encounter, vitals not obtained.  Body mass index is 23.17 kg/m.  Physical Exam  Awake, alert and oriented  PHQ2/9:    12/08/2022   11:14 AM 09/05/2022    3:15 PM 06/05/2022    3:13 PM 02/14/2022   10:54 AM 12/21/2021    2:35 PM  Depression screen PHQ 2/9  Decreased Interest 0 0 0 0 0  Down, Depressed, Hopeless 0 0 0 0 0  PHQ - 2 Score 0 0 0 0 0  Altered sleeping 0 0  0 0  Tired, decreased energy 0 0  0 0  Change in appetite 0 0  0 0  Feeling bad or failure about yourself  0 0  0 0  Trouble concentrating 0 0  0 0  Moving slowly or fidgety/restless 0 0  0 0  Suicidal thoughts 0 0  0 0  PHQ-9 Score 0 0  0 0  Difficult doing work/chores    Not difficult at all    PHQ-2/9 Result is negative.    Fall Risk:    12/08/2022   11:14 AM 09/05/2022    3:15 PM 06/05/2022    3:13 PM 02/14/2022   10:54 AM 12/21/2021    2:35 PM  Fall Risk   Falls in the past year? 1 0 0 0 0  Number falls in past yr: 1 0 0 0 0  Injury with Fall? 0 0 0 0 0  Risk for fall due to : History of fall(s);Impaired  mobility;Impaired balance/gait No Fall Risks No Fall Risks No Fall Risks No Fall Risks  Follow up Falls  prevention discussed Falls prevention discussed Falls prevention discussed Falls prevention discussed Falls prevention discussed     Assessment & Plan  1. Hospital discharge follow-up  Reviewed records and did medication reconciliation with patient   2. Essential hypertension  He resume medications yesterday but bp is still elevated , advised to check bp twice daily and if bp still above 140/90 in the evening may take extra dose of norvasc 5 mg until follow up next week  - valsartan-hydrochlorothiazide (DIOVAN-HCT) 320-25 MG tablet; Take 1 tablet by mouth daily.  Dispense: 90 tablet; Refill: 0 - amLODipine (NORVASC) 5 MG tablet; Take 1 tablet (5 mg total) by mouth in the morning and at bedtime.  Dispense: 30 tablet; Refill: 0  3. S/P above knee amputation, right (HCC)  Denies phantom pain   4. Type 2 diabetes with complication (HCC)  Recent amputation due to diabetic foot wound  5. History of necrotizing fasciitis   I discussed the assessment and treatment plan with the patient. The patient was provided an opportunity to ask questions and all were answered. The patient agreed with the plan and demonstrated an understanding of the instructions.  The patient was advised to call back or seek an in-person evaluation if the symptoms worsen or if the condition fails to improve as anticipated.  I provided 25  minutes of non-face-to-face time during this encounter.

## 2022-12-08 NOTE — Telephone Encounter (Signed)
Forms fax

## 2022-12-12 NOTE — Progress Notes (Addendum)
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 He had blood transfusion during his stay and acute kidney failure and we will recheck labs today. He developed a blister on left lower leg after hospital stay, it ruptured spontaneously and wife is keeping the area clean. His left leg was very swollen initially but is now much better, he is elevating his leg. He has PAD he is taking cholesterol medication . Due to right AKA he needs tub transfer bench for bathing    HTN: currently taking valsartan hctz 320/25 mg daily and norvasc 5 mg, bp when he arrived but he states at home with PT and also with his bp cuff bp has been controlled around 120/80   DMII: A1C during hospital stay was 7.3 %, he has been taking glipizide , Farxiga and Rybelsus but he would like to switch to [place]. We will try stopping glipizide and Rybelsus and give him higher dose of Mounrjarno  . Advised to monitor glucose daily and let me know, we may need to resume glipizide if needed , he is also wondering if he can use a CGM. , he will check coverage with insurance . He noticed some polyuria but no dysuria   Sore on corners of his mouth since hospital stay   Patient Active Problem List   Diagnosis Date Noted   Soft tissue infection of foot 11/21/2022   Elevated troponin 11/21/2022   Gas gangrene ([place]) 11/21/2022   Hypokalemia 11/21/2022   B12 deficiency 09/05/2022   Proliferative diabetic retinopathy of both eyes without macular edema associated with type 2 diabetes mellitus ([place]) 06/05/2022   PAD (peripheral artery disease) ([place]) 04/12/2021   AKI (acute kidney injury) ([place]) 04/12/2021   Secondary renal hyperparathyroidism ([place]) 03/19/2019   Diabetic ulcer of foot with fat layer exposed ([place]) 12/18/2018   Dyslipidemia associated with type 2 diabetes mellitus  ([place]) 07/03/2018   Vitamin D deficiency 04/11/2018   ED (erectile dysfunction) 04/10/2018   Lower urinary tract symptoms (LUTS) 02/09/2017   Hypertension 07/05/2015   Hyperglycemia due to type 2 diabetes mellitus ([place]) 07/05/2015   HLD (hyperlipidemia) 07/05/2015    Past Surgical History:  Procedure Laterality Date   AMPUTATION Right 04/16/2021   Procedure: AMPUTATION  RIGHT 5TH RAY, INCISION AND DRAINAGE;  Surgeon: Edrick Kins, DPM;  Location: [facility];  Service: Podiatry;  Laterality: Right;   AMPUTATION Right 11/24/2022   Procedure: AMPUTATION ABOVE KNEE;  Surgeon: Katha Cabal, MD;  Location: [facility];  Service: Vascular;  Laterality: Right;   APPLICATION OF WOUND VAC Right 04/21/2021   Procedure: APPLICATION OF WOUND VAC;  Surgeon: Edrick Kins, DPM;  Location: [facility];  Service: Podiatry;  Laterality: Right;   BONE BIOPSY Left 06/27/2020   Procedure: BONE BIOPSY;  Surgeon: Caroline More, DPM;  Location: [facility];  Service: Podiatry;  Laterality: Left;   COLONOSCOPY WITH PROPOFOL N/A 06/22/2022   Procedure: COLONOSCOPY WITH PROPOFOL;  Surgeon: [name], MD;  Location: [facility];  Service: Gastroenterology;  Laterality: N/A;   I & D EXTREMITY Right 11/22/2022   Procedure: IRRIGATION AND DEBRIDEMENT EXTREMITY, below knee amputation right leg;  Surgeon: Jules Husbands, MD;  Location: [facility];  Service: General;  Laterality: Right;   INCISION AND DRAINAGE Left 06/27/2020   Procedure:  INCISION AND DRAINAGE;  Surgeon: Caroline More, DPM;  Location: [facility];  Service: Podiatry;  Laterality: Left;   IRRIGATION AND DEBRIDEMENT FOOT Right 04/21/2021   Procedure: IRRIGATION AND DEBRIDEMENT FOOT;  Surgeon: Edrick Kins, DPM;  Location: [facility];  Service: Podiatry;  Laterality: Right;   LOWER EXTREMITY ANGIOGRAPHY Left 06/30/2020   Procedure: Lower Extremity Angiography;  Surgeon: Algernon Huxley, MD;  Location: [place] [facility];  Service: Cardiovascular;  Laterality: Left;    LOWER EXTREMITY ANGIOGRAPHY Right 04/14/2021   Procedure: Lower Extremity Angiography;  Surgeon: Algernon Huxley, MD;  Location: [place] [facility];  Service: Cardiovascular;  Laterality: Right;   NO PAST SURGERIES     WOUND DEBRIDEMENT Right 04/19/2021   Procedure: DEBRIDEMENT WOUND;  Surgeon: Edrick Kins, DPM;  Location: [facility];  Service: Podiatry;  Laterality: Right;    Family History  Problem Relation Age of Onset   Heart disease Father    Heart attack Father    Healthy Mother    Prostate cancer Neg Hx    Kidney cancer Neg Hx    Bladder Cancer Neg Hx     Social History   Tobacco Use   Smoking status: Never   Smokeless tobacco: Never  Substance Use Topics   Alcohol use: Yes    Alcohol/week: 6.0 - 12.0 standard drinks of alcohol    Types: 6 - 12 Cans of beer per week    Comment: Occasional     Current Outpatient Medications:    Accu-Chek Softclix Lancets lancets, USE TO TEST BLOOD SUGAR UP TO FOUR TIMES DAILY AS DIRECTED, Disp: 300 each, Rfl: 1   acetaminophen (TYLENOL) 325 MG tablet, Take 2 tablets (650 mg total) by mouth every 6 (six) hours as needed for mild pain, fever or headache., Disp: , Rfl:    amLODipine (NORVASC) 5 MG tablet, Take 1 tablet (5 mg total) by mouth in the morning and at bedtime., Disp: 30 tablet, Rfl: 0   empagliflozin (JARDIANCE) 25 MG TABS tablet, Take 1 tablet (25 mg total) by mouth daily., Disp: 90 tablet, Rfl: 1   glipiZIDE (GLUCOTROL XL) 5 MG 24 hr tablet, TAKE 1 TABLET(5 MG) BY MOUTH DAILY WITH BREAKFAST, Disp: 90 tablet, Rfl: 1   oxyCODONE (OXY IR/ROXICODONE) 5 MG immediate release tablet, Take 1 tablet (5 mg total) by mouth every 8 (eight) hours as needed for moderate pain., Disp: 12 tablet, Rfl: 0   prednisoLONE acetate (PRED FORTE) 1 % ophthalmic suspension, 1 drop every 4 (four) hours., Disp: , Rfl:    rosuvastatin (CRESTOR) 40 MG tablet, TAKE 1 TABLET(40 MG) BY MOUTH DAILY, Disp: 90 tablet, Rfl: 1   tadalafil (CIALIS) 20 MG tablet, Take 1  tablet (20 mg total) by mouth daily as needed for erectile dysfunction., Disp: 30 tablet, Rfl: 6   valsartan-hydrochlorothiazide (DIOVAN-HCT) 320-25 MG tablet, Take 1 tablet by mouth daily., Disp: 90 tablet, Rfl: 0  No Known Allergies  I personally reviewed active problem list, medication list, allergies, family history, social history, health maintenance with the patient/caregiver today.   ROS  Ten systems reviewed and is negative except as mentioned in HPI   Objective  Vitals:   12/13/22 1529  BP: (!) 150/82  Pulse: 74  Resp: 16  SpO2: 99%  Weight: 180 lb (81.6 kg)  Height: '5\' 11"'$  (1.803 m)    Body mass index is 25.1 kg/m.  Physical Exam  Constitutional: Patient appears well-developed and well-nourished.  No distress.  HEENT: head atraumatic,  normocephalic, pupils equal and reactive to light,, neck supple,  irritation of labial commissures  Cardiovascular: Normal rate, regular rhythm and normal heart sounds.  No murmur heard.LLE edema 2 plus pitting, wearing shoes, large superficial ulceration of left anterior lower leg, no signs of infection  Pulmonary/Chest: Effort normal and breath sounds normal. No respiratory distress. Abdominal: Soft.  There is no tenderness. Psychiatric: Patient has a normal mood and affect. behavior is normal. Judgment and thought content normal.    PHQ2/9:    12/13/2022    3:29 PM 12/08/2022   11:14 AM 09/05/2022    3:15 PM 06/05/2022    3:13 PM 02/14/2022   10:54 AM  Depression screen PHQ 2/9  Decreased Interest 0 0 0 0 0  Down, Depressed, Hopeless 0 0 0 0 0  PHQ - 2 Score 0 0 0 0 0  Altered sleeping 0 0 0  0  Tired, decreased energy 0 0 0  0  Change in appetite 0 0 0  0  Feeling bad or failure about yourself  0 0 0  0  Trouble concentrating 0 0 0  0  Moving slowly or fidgety/restless 0 0 0  0  Suicidal thoughts 0 0 0  0  PHQ-9 Score 0 0 0  0  Difficult doing work/chores     Not difficult at all    phq 9 is negative   Fall Risk:     12/13/2022    3:29 PM 12/08/2022   11:14 AM 09/05/2022    3:15 PM 06/05/2022    3:13 PM 02/14/2022   10:54 AM  Fall Risk   Falls in the past year? 1 1 0 0 0  Number falls in past yr: 1 1 0 0 0  Injury with Fall? 0 0 0 0 0  Risk for fall due to : Impaired balance/gait;Impaired mobility History of fall(s);Impaired mobility;Impaired balance/gait No Fall Risks No Fall Risks No Fall Risks  Follow up Falls prevention discussed Falls prevention discussed Falls prevention discussed Falls prevention discussed Falls prevention discussed      Functional Status Survey: Is the patient deaf or have difficulty hearing?: No Does the patient have difficulty seeing, even when wearing glasses/contacts?: No Does the patient have difficulty concentrating, remembering, or making decisions?: No Does the patient have difficulty walking or climbing stairs?: Yes Does the patient have difficulty dressing or bathing?: No Does the patient have difficulty doing errands alone such as visiting a doctor's office or shopping?: Yes    Assessment & Plan   1. History of above knee amputation, right [facility])  Doing well, follow up with vascular surgeon next week  2. Essential hypertension  - amLODipine (NORVASC) 5 MG tablet; Take 2 tablets (10 mg total) by mouth daily.  Dispense: 90 tablet; Refill: 0  3. Dyslipidemia associated with type 2 diabetes mellitus (HCC)  - Urine Microalbumin w/creat. ratio - rosuvastatin (CRESTOR) 40 MG tablet; TAKE 1 TABLET(40 MG) BY MOUTH DAILY  Dispense: 90 tablet; Refill: 1 - tirzepatide (MOUNJARO) 7.5 MG/0.5ML Pen; Inject 7.5 mg into the skin once a week. In place of glipizde and rybelsus  Dispense: 2 mL; Refill: 0  4. Skin ulcer of lower leg, limited to breakdown of skin, left (HCC)  Advised not to use bandaid, just vaseline and cover with gauze and pull up socks over to keep gauze in place   5. PAD (peripheral artery disease) ([place])  Discussed resuming Aspirin 81 mg daily   6.  Cheilitis  - nystatin-triamcinolone  ointment (MYCOLOG); Apply 1 Application topically 2 (two) times daily. Corners of mouth  Dispense: 30 g; Refill: 0   7. Urinary frequency  - POCT Urinalysis Dipstick

## 2022-12-13 ENCOUNTER — Telehealth: Payer: Self-pay | Admitting: Family Medicine

## 2022-12-13 ENCOUNTER — Encounter: Payer: Self-pay | Admitting: Family Medicine

## 2022-12-13 ENCOUNTER — Telehealth (INDEPENDENT_AMBULATORY_CARE_PROVIDER_SITE_OTHER): Payer: Self-pay | Admitting: Nurse Practitioner

## 2022-12-13 ENCOUNTER — Ambulatory Visit: Payer: Medicaid Other | Admitting: Family Medicine

## 2022-12-13 VITALS — BP 146/80 | HR 74 | Resp 16 | Ht 71.0 in | Wt 180.0 lb

## 2022-12-13 DIAGNOSIS — E785 Hyperlipidemia, unspecified: Secondary | ICD-10-CM | POA: Diagnosis not present

## 2022-12-13 DIAGNOSIS — K130 Diseases of lips: Secondary | ICD-10-CM | POA: Diagnosis not present

## 2022-12-13 DIAGNOSIS — E1169 Type 2 diabetes mellitus with other specified complication: Secondary | ICD-10-CM

## 2022-12-13 DIAGNOSIS — L97921 Non-pressure chronic ulcer of unspecified part of left lower leg limited to breakdown of skin: Secondary | ICD-10-CM | POA: Diagnosis not present

## 2022-12-13 DIAGNOSIS — I10 Essential (primary) hypertension: Secondary | ICD-10-CM

## 2022-12-13 DIAGNOSIS — Z89611 Acquired absence of right leg above knee: Secondary | ICD-10-CM

## 2022-12-13 DIAGNOSIS — E118 Type 2 diabetes mellitus with unspecified complications: Secondary | ICD-10-CM

## 2022-12-13 DIAGNOSIS — I739 Peripheral vascular disease, unspecified: Secondary | ICD-10-CM | POA: Diagnosis not present

## 2022-12-13 DIAGNOSIS — Z09 Encounter for follow-up examination after completed treatment for conditions other than malignant neoplasm: Secondary | ICD-10-CM

## 2022-12-13 DIAGNOSIS — R350 Frequency of micturition: Secondary | ICD-10-CM

## 2022-12-13 LAB — POCT URINALYSIS DIPSTICK
Appearance: NORMAL
Bilirubin, UA: NEGATIVE
Glucose, UA: POSITIVE — AB
Leukocytes, UA: NEGATIVE
Nitrite, UA: NEGATIVE
Protein, UA: POSITIVE — AB
Spec Grav, UA: 1.015 (ref 1.010–1.025)
Urobilinogen, UA: 0.2 E.U./dL
pH, UA: 6 (ref 5.0–8.0)

## 2022-12-13 MED ORDER — AMLODIPINE BESYLATE 5 MG PO TABS: 5.0000 mg | ORAL_TABLET | Freq: Two times a day (BID) | ORAL | 0 refills | Status: DC

## 2022-12-13 MED ORDER — NYSTATIN-TRIAMCINOLONE 100000-0.1 UNIT/GM-% EX OINT: 1.0000 | TOPICAL_OINTMENT | Freq: Two times a day (BID) | CUTANEOUS | 0 refills | Status: DC

## 2022-12-13 MED ORDER — AMLODIPINE BESYLATE 5 MG PO TABS: 10.0000 mg | ORAL_TABLET | Freq: Every day | ORAL | 0 refills | Status: DC

## 2022-12-13 MED ORDER — ROSUVASTATIN CALCIUM 40 MG PO TABS: ORAL_TABLET | ORAL | 1 refills | Status: DC

## 2022-12-13 MED ORDER — TIRZEPATIDE 7.5 MG/0.5ML ~~LOC~~ SOAJ: 7.5000 mg | SUBCUTANEOUS | 0 refills | Status: DC

## 2022-12-13 NOTE — Telephone Encounter (Signed)
Patient's wife called to  ask about the FMLA papers.  She can be reached at [phone]. Please advise.

## 2022-12-13 NOTE — Telephone Encounter (Signed)
Patient spouse made aware that FMLA papers were faxed on 12/08/2022

## 2022-12-14 NOTE — Telephone Encounter (Signed)
[facility] advised pt that Dr. Ancil Boozer needs to call [facility] to verify the medications sent yesterday / please advise

## 2022-12-14 NOTE — Telephone Encounter (Signed)
[facility] called and spoke to [place], [facility] about the refill(s) amlodipine requested. She says it's ready for the patient to pick up, take 2 ('5mg'$ ) to equal 10 mg daily. I verified that's the correct Rx sent by Dr. Ancil Boozer on 12/13/22.

## 2022-12-14 NOTE — Telephone Encounter (Signed)
PA sent today. Waiting for determination

## 2022-12-14 NOTE — Telephone Encounter (Unsigned)
Copied from [place] [id number]. Topic: General - Other >> Dec 14, 2022 11:45 AM Everette C wrote: Reason for CRM: The patient has been directed to contact their PCP and request completion of a prior authorization for their tirzepatide [facility]) 7.5 MG/0.5ML Pen [[id number]] prescription and nystatin-triamcinolone ointment [facility]) [[id number]]  The patient would like to be contacted by a member of staff regarding the completion of their prior authorization   Please contact further when possible

## 2022-12-15 ENCOUNTER — Telehealth: Payer: Self-pay

## 2022-12-15 ENCOUNTER — Other Ambulatory Visit: Payer: Self-pay | Admitting: Family Medicine

## 2022-12-15 ENCOUNTER — Ambulatory Visit: Payer: Self-pay

## 2022-12-15 LAB — MICROALBUMIN / CREATININE URINE RATIO
Creatinine, Urine: 22 mg/dL (ref 20–320)
Microalb Creat Ratio: 2932 mcg/mg creat — ABNORMAL HIGH (ref <30)
Microalb, Ur: 64.5 mg/dL

## 2022-12-15 LAB — CULTURE, URINE COMPREHENSIVE
MICRO NUMBER:: 14383298
SPECIMEN QUALITY:: ADEQUATE

## 2022-12-15 MED ORDER — AMOXICILLIN 500 MG PO CAPS: 500.0000 mg | ORAL_CAPSULE | Freq: Three times a day (TID) | ORAL | 0 refills | Status: AC

## 2022-12-15 NOTE — Telephone Encounter (Signed)
Patient checking on the status of the PA.and stated he spoke with his insurance and they confirmed PA was approved. Patient would like a follow up call regarding the status

## 2022-12-15 NOTE — Telephone Encounter (Signed)
See telephone encounter.

## 2022-12-15 NOTE — Telephone Encounter (Signed)
Pt stated he was advised by insurance. Dr.Sowles did not give enough information as to why he needed this medication. Pt is upset, stated he has been getting the runaround, and is requesting to speak with someone.  Pt is requesting a call back today.   Please advise.

## 2022-12-15 NOTE — Telephone Encounter (Signed)
  Chief Complaint: medication clarification  Symptoms:  Frequency: today Pertinent Negatives: NA Disposition: '[]'$ ED /'[]'$ Urgent Care (no appt availability in office) / '[]'$ Appointment(In office/virtual)/ '[]'$  [facility] [facility]/ '[x]'$ Home Care/ '[]'$ Refused Recommended Disposition /'[]'$ [place] [facility]/ '[]'$  Follow-up with PCP Additional Notes: [facility] called spoke with [place], [facility]. Gave him clarification from Dr. Ancil Boozer that rx is for 5 days TID = #15. No further assistance needed   Summary: clairifcation on med   Pharmacy called needing clarification on the Amoxicillin that was just sent over.  The number of pills is not adding up to the dose.  CB#  [phone]         Reason for Disposition  Pharmacy calling with prescription question and triager answers question  Answer Assessment - Initial Assessment Questions 1. NAME of MEDICINE: "What medicine(s) are you calling about?"     Amoxicillin  2. QUESTION: "What is your question?" (e.g., double dose of medicine, side effect)     Needing clarification  3. PRESCRIBER: "Who prescribed the medicine?" Reason: if prescribed by specialist, call should be referred to that group.     Dr. Ancil Boozer  Protocols used: Medication Question Call-A-AH

## 2022-12-15 NOTE — Telephone Encounter (Signed)
Spoke with patient to let him know PA and subsequently appeal was not approved for [facility] and Dr. Ancil Boozer recommended keeping him on his previous medications as they are in the same class. Patient became disgruntled and stated he was going to begin looking for a new PCP that could get it approved for him. Dr. Ancil Boozer overheard him and spoke to him, letting him know that he did not meet the specific criteria set by his insurance company in order for the approval, and attempted to explain to him that the class of medication (GLP-1) were equivalent and patient was dismissive and in disagreement.

## 2022-12-19 ENCOUNTER — Telehealth: Payer: Self-pay | Admitting: Family Medicine

## 2022-12-19 ENCOUNTER — Ambulatory Visit: Payer: Self-pay

## 2022-12-19 ENCOUNTER — Other Ambulatory Visit: Payer: Self-pay | Admitting: Family Medicine

## 2022-12-19 DIAGNOSIS — E1169 Type 2 diabetes mellitus with other specified complication: Secondary | ICD-10-CM

## 2022-12-19 MED ORDER — RYBELSUS 14 MG PO TABS: 14.0000 mg | ORAL_TABLET | Freq: Every day | ORAL | 1 refills | Status: DC

## 2022-12-19 MED ORDER — GLIPIZIDE ER 5 MG PO TB24: 5.0000 mg | ORAL_TABLET | Freq: Every day | ORAL | 1 refills | Status: DC

## 2022-12-19 NOTE — Telephone Encounter (Signed)
   Pt.'s insurance will not pay for [facility], pt. Wants to go back on Rybelsus. Please advise pt.BS this morning 277. Answer Assessment - Initial Assessment Questions 1. NAME of MEDICINE: "What medicine(s) are you calling about?"     Insurance will not pay for Mounjero , Pt. Wants to go back on Rylbesus 2. QUESTION: "What is your question?" (e.g., double dose of medicine, side effect)      3. PRESCRIBER: "Who prescribed the medicine?" Reason: if prescribed by specialist, call should be referred to that group.     Dr. Ancil Boozer 4. SYMPTOMS: "Do you have any symptoms?" If Yes, ask: "What symptoms are you having?"  "How bad are the symptoms (e.g., mild, moderate, severe)     BS - 277 5. PREGNANCY:  "Is there any chance that you are pregnant?" "When was your last menstrual period?"     N/a  Protocols used: Medication Question Call-A-AH

## 2022-12-19 NOTE — Telephone Encounter (Unsigned)
Copied from [place] [phone]. Topic: General - Other >> Dec 19, 2022 12:16 PM Sabas Sous wrote: Reason for CRM: Medicaid called to report that they did not approve the [facility] because the PCP failed to submit a prior auth like they requested. They are requesting a call back to discuss   Best contact: [phone]  Or [email or web address]

## 2022-12-19 NOTE — Telephone Encounter (Signed)
Medicaid states no info he ever did metformin due to his plan starting in Sept. I told her he's tried and failed 5 different medications. I asked for the fax number [phone]) and sent OV notes [id number]

## 2022-12-21 ENCOUNTER — Encounter (INDEPENDENT_AMBULATORY_CARE_PROVIDER_SITE_OTHER): Payer: Self-pay | Admitting: Nurse Practitioner

## 2022-12-21 ENCOUNTER — Ambulatory Visit (INDEPENDENT_AMBULATORY_CARE_PROVIDER_SITE_OTHER): Payer: Medicaid Other | Admitting: Nurse Practitioner

## 2022-12-21 VITALS — BP 178/93 | HR 62 | Ht 71.0 in | Wt 189.0 lb

## 2022-12-21 DIAGNOSIS — Z89611 Acquired absence of right leg above knee: Secondary | ICD-10-CM

## 2022-12-22 NOTE — Telephone Encounter (Unsigned)
Copied from [place] [id number]. Topic: Quick Communication - Home Health Verbal Orders >> Dec 22, 2022  3:50 PM Everette C wrote: Caller/Agency: Raquel Sarna / [facility]  Callback Number:  [phone] Requesting OT/PT/Skilled Nursing/Social Work/Speech Therapy: skilled nursing    Raquel Sarna is requesting to add nursing due to a wound to the patient's left shin

## 2022-12-25 NOTE — Telephone Encounter (Signed)
Verbal order given per Dr. Sowles.  ?

## 2022-12-29 ENCOUNTER — Telehealth (INDEPENDENT_AMBULATORY_CARE_PROVIDER_SITE_OTHER): Payer: Medicaid Other

## 2022-12-29 DIAGNOSIS — Z89611 Acquired absence of right leg above knee: Secondary | ICD-10-CM

## 2022-12-29 NOTE — Telephone Encounter (Signed)
Patient needs an order for a shrinker sent to [facility] clinic.

## 2022-12-29 NOTE — Telephone Encounter (Signed)
done

## 2023-01-01 NOTE — Telephone Encounter (Signed)
Order faxed to [facility].

## 2023-01-02 NOTE — Telephone Encounter (Unsigned)
Copied from [place] [id number]. Topic: General - Other >> Jan 02, 2023  3:35 PM Sabas Sous wrote: Reason for CRM: Stanton Kidney from [facility] called to get approval from PCP regarding the patient having a circulation air compression massager with heat, for his left foot.. he has a blister that popped open.   Mary from [facility]  Best contact:  [phone]

## 2023-01-03 ENCOUNTER — Telehealth (INDEPENDENT_AMBULATORY_CARE_PROVIDER_SITE_OTHER): Payer: Self-pay

## 2023-01-03 NOTE — Telephone Encounter (Signed)
Patient made aware with medical advice and verbalized understanding 

## 2023-01-03 NOTE — Telephone Encounter (Signed)
Spoke with Stanton Kidney and made aware. She said she will contact his vascular doctor.

## 2023-01-03 NOTE — Telephone Encounter (Signed)
That should be fine. 

## 2023-01-05 ENCOUNTER — Telehealth: Payer: Self-pay | Admitting: Family Medicine

## 2023-01-05 ENCOUNTER — Other Ambulatory Visit: Payer: Self-pay

## 2023-01-05 DIAGNOSIS — Z89611 Acquired absence of right leg above knee: Secondary | ICD-10-CM

## 2023-01-05 NOTE — Telephone Encounter (Signed)
Cherly Anderson from [facility] home [facility] called and stated that the pt Would benefit from a tub transfer bench/ wanted to see if an order can be sent over to [facility] / please advise   Needed Due to right above knee amputation   Also his PT home health discharge was done today early to preserve visit for when he gets his prosthesis soon

## 2023-01-05 NOTE — Telephone Encounter (Signed)
DME ordered and signed. Faxed to [facility].

## 2023-01-07 ENCOUNTER — Encounter (INDEPENDENT_AMBULATORY_CARE_PROVIDER_SITE_OTHER): Payer: Self-pay | Admitting: Nurse Practitioner

## 2023-01-07 NOTE — Progress Notes (Signed)
Subjective:    Patient ID: [name], male    DOB: 1966/09/22, 57 y.o.   MRN: [id number] Chief Complaint  Patient presents with   Hospitalization Follow-up    [name] is a 57 year old male who underwent a right above-knee amputation on 11/24/2022.  Due to infection he initially required a guillotine amputation.  Today the wound is well-healed.  No evidence of dehiscence.  The patient is doing well with transfers and notes no significant phantom limb pain.    Review of Systems  Skin:  Positive for wound.  All other systems reviewed and are negative.      Objective:   Physical Exam Vitals reviewed.  HENT:     Head: Normocephalic.  Cardiovascular:     Rate and Rhythm: Normal rate.  Pulmonary:     Effort: Pulmonary effort is normal.  Skin:    General: Skin is warm and dry.  Neurological:     Mental Status: He is alert and oriented to person, place, and time.     Gait: Gait abnormal.  Psychiatric:        Mood and Affect: Mood normal.        Behavior: Behavior normal.        Thought Content: Thought content normal.        Judgment: Judgment normal.     BP (!) 178/93   Pulse 62   Ht '5\' 11"'$  (1.803 m)   Wt 189 lb (85.7 kg)   BMI 26.36 kg/m   Past Medical History:  Diagnosis Date   Diabetes mellitus without complication ([place])    Hyperlipidemia    Hypertension     Social History   Socioeconomic History   Marital status: Married    Spouse name: [name]    Number of children: 1   Years of education: Not on file   Highest education level: Not on file  Occupational History   Not on file  Tobacco Use   Smoking status: Never   Smokeless tobacco: Never  Vaping Use   Vaping Use: Never used  Substance and Sexual Activity   Alcohol use: Yes    Alcohol/week: 6.0 - 12.0 standard drinks of alcohol    Types: 6 - 12 Cans of beer per week    Comment: Occasional   Drug use: No   Sexual activity: Yes  Other Topics Concern   Not on file  Social History Narrative    Not on file   Social Determinants of Health   Financial Resource Strain: Low Risk  (07/02/2019)   Overall Financial Resource Strain (CARDIA)    Difficulty of Paying Living Expenses: Not hard at all  Food Insecurity: Food Insecurity Present (11/22/2022)   Hunger Vital Sign    Worried About Running Out of Food in the Last Year: Sometimes true    Ran Out of Food in the Last Year: Never true  Transportation Needs: No Transportation Needs (11/22/2022)   PRAPARE - Hydrologist (Medical): No    Lack of Transportation (Non-Medical): No  Recent Concern: Transportation Needs - Unmet Transportation Needs (11/22/2022)   PRAPARE - Hydrologist (Medical): Not on file    Lack of Transportation (Non-Medical): Yes  Physical Activity: Insufficiently Active (07/02/2019)   Exercise Vital Sign    Days of Exercise per Week: 4 days    Minutes of Exercise per Session: 30 min  Stress: No Stress Concern Present (07/02/2019)  [place] Questionnaire    Feeling of Stress : Not at all  Social Connections: Not on file  Intimate Partner Violence: Not At Risk (11/22/2022)   Humiliation, Afraid, Rape, and Kick questionnaire    Fear of Current or Ex-Partner: No    Emotionally Abused: No    Physically Abused: No    Sexually Abused: No    Past Surgical History:  Procedure Laterality Date   AMPUTATION Right 04/16/2021   Procedure: AMPUTATION  RIGHT 5TH RAY, INCISION AND DRAINAGE;  Surgeon: Edrick Kins, DPM;  Location: [facility];  Service: Podiatry;  Laterality: Right;   AMPUTATION Right 11/24/2022   Procedure: AMPUTATION ABOVE KNEE;  Surgeon: Katha Cabal, MD;  Location: [facility];  Service: Vascular;  Laterality: Right;   APPLICATION OF WOUND VAC Right 04/21/2021   Procedure: APPLICATION OF WOUND VAC;  Surgeon: Edrick Kins, DPM;  Location: [facility];  Service: Podiatry;  Laterality: Right;    BONE BIOPSY Left 06/27/2020   Procedure: BONE BIOPSY;  Surgeon: Caroline More, DPM;  Location: [facility];  Service: Podiatry;  Laterality: Left;   COLONOSCOPY WITH PROPOFOL N/A 06/22/2022   Procedure: COLONOSCOPY WITH PROPOFOL;  Surgeon: [name], MD;  Location: [facility];  Service: Gastroenterology;  Laterality: N/A;   I & D EXTREMITY Right 11/22/2022   Procedure: IRRIGATION AND DEBRIDEMENT EXTREMITY, below knee amputation right leg;  Surgeon: Jules Husbands, MD;  Location: [facility];  Service: General;  Laterality: Right;   INCISION AND DRAINAGE Left 06/27/2020   Procedure: INCISION AND DRAINAGE;  Surgeon: Caroline More, DPM;  Location: [facility];  Service: Podiatry;  Laterality: Left;   IRRIGATION AND DEBRIDEMENT FOOT Right 04/21/2021   Procedure: IRRIGATION AND DEBRIDEMENT FOOT;  Surgeon: Edrick Kins, DPM;  Location: [facility];  Service: Podiatry;  Laterality: Right;   LOWER EXTREMITY ANGIOGRAPHY Left 06/30/2020   Procedure: Lower Extremity Angiography;  Surgeon: Algernon Huxley, MD;  Location: [place] [facility];  Service: Cardiovascular;  Laterality: Left;   LOWER EXTREMITY ANGIOGRAPHY Right 04/14/2021   Procedure: Lower Extremity Angiography;  Surgeon: Algernon Huxley, MD;  Location: [place] [facility];  Service: Cardiovascular;  Laterality: Right;   NO PAST SURGERIES     WOUND DEBRIDEMENT Right 04/19/2021   Procedure: DEBRIDEMENT WOUND;  Surgeon: Edrick Kins, DPM;  Location: [facility];  Service: Podiatry;  Laterality: Right;    Family History  Problem Relation Age of Onset   Heart disease Father    Heart attack Father    Healthy Mother    Prostate cancer Neg Hx    Kidney cancer Neg Hx    Bladder Cancer Neg Hx     No Known Allergies     Latest Ref Rng & Units 11/30/2022    5:21 AM 11/29/2022    6:09 AM 11/27/2022    3:10 AM  CBC  WBC 4.0 - 10.5 K/uL 11.5  15.0  13.4   Hemoglobin 13.0 - 17.0 g/dL 7.7  7.3  8.7   Hematocrit 39.0 - 52.0 % 23.5  22.6  26.1   Platelets 150 -  400 K/uL 245  225  226       CMP     Component Value Date/Time   NA 137 11/29/2022 0609   NA 141 09/06/2022 1417   K 3.8 11/29/2022 0609   CL 105 11/29/2022 0609   CO2 27 11/29/2022 0609   GLUCOSE 201 (H) 11/29/2022 0609   BUN  22 (H) 11/29/2022 0609   BUN 25 (H) 09/06/2022 1417   CREATININE 0.78 11/29/2022 0609   CREATININE 0.79 11/09/2021 0902   CALCIUM 7.8 (L) 11/29/2022 0609   PROT 5.8 (L) 11/25/2022 0511   PROT 7.3 09/06/2022 1417   ALBUMIN 1.7 (L) 11/25/2022 0511   ALBUMIN 3.8 09/06/2022 1417   AST 19 11/25/2022 0511   ALT 9 11/25/2022 0511   ALKPHOS 62 11/25/2022 0511   BILITOT 1.2 11/25/2022 0511   BILITOT 1.5 (H) 09/06/2022 1417   GFRNONAA >60 11/29/2022 0609   GFRNONAA 83 02/09/2021 1135   GFRAA 96 02/09/2021 1135     No results found.     Assessment & Plan:   1. Hx of AKA (above knee amputation), right ([place]) He was seen for further evaluation for right above knee prosthesis.He Is a 57 year old male who had amputation on 11/24/2022.  At the time he is well healed and ready for fitting of new above knee prosthesis.  He is a highly motivated individual and should do well once fitted with prosthesis.  She has no problem returning to a K2 ambulator, which will allow him to walk inside and outside of his home and overload level barriers.   Current Outpatient Medications on File Prior to Visit  Medication Sig Dispense Refill   Accu-Chek Softclix Lancets lancets USE TO TEST BLOOD SUGAR UP TO FOUR TIMES DAILY AS DIRECTED 300 each 1   acetaminophen (TYLENOL) 325 MG tablet Take 2 tablets (650 mg total) by mouth every 6 (six) hours as needed for mild pain, fever or headache.     amLODipine (NORVASC) 5 MG tablet Take 2 tablets (10 mg total) by mouth daily. 90 tablet 0   empagliflozin (JARDIANCE) 25 MG TABS tablet Take 1 tablet (25 mg total) by mouth daily. 90 tablet 1   glipiZIDE (GLUCOTROL XL) 5 MG 24 hr tablet Take 1 tablet (5 mg total) by mouth daily with breakfast.  90 tablet 1   nystatin-triamcinolone ointment (MYCOLOG) Apply 1 Application topically 2 (two) times daily. Corners of mouth 30 g 0   prednisoLONE acetate (PRED FORTE) 1 % ophthalmic suspension 1 drop every 4 (four) hours.     rosuvastatin (CRESTOR) 40 MG tablet TAKE 1 TABLET(40 MG) BY MOUTH DAILY 90 tablet 1   Semaglutide (RYBELSUS) 14 MG TABS Take 1 tablet (14 mg total) by mouth daily. 90 tablet 1   tadalafil (CIALIS) 20 MG tablet Take 1 tablet (20 mg total) by mouth daily as needed for erectile dysfunction. 30 tablet 6   valsartan-hydrochlorothiazide (DIOVAN-HCT) 320-25 MG tablet Take 1 tablet by mouth daily. 90 tablet 0   No current facility-administered medications on file prior to visit.    There are no Patient Instructions on file for this visit. No follow-ups on file.   Kris Hartmann, NP

## 2023-01-16 DIAGNOSIS — H35352 Cystoid macular degeneration, left eye: Secondary | ICD-10-CM | POA: Diagnosis not present

## 2023-01-16 DIAGNOSIS — H2513 Age-related nuclear cataract, bilateral: Secondary | ICD-10-CM | POA: Diagnosis not present

## 2023-01-16 DIAGNOSIS — E113593 Type 2 diabetes mellitus with proliferative diabetic retinopathy without macular edema, bilateral: Secondary | ICD-10-CM | POA: Diagnosis not present

## 2023-01-25 ENCOUNTER — Encounter (INDEPENDENT_AMBULATORY_CARE_PROVIDER_SITE_OTHER): Payer: Self-pay | Admitting: Nurse Practitioner

## 2023-01-25 ENCOUNTER — Ambulatory Visit (INDEPENDENT_AMBULATORY_CARE_PROVIDER_SITE_OTHER): Payer: Medicaid Other | Admitting: Nurse Practitioner

## 2023-01-25 VITALS — BP 154/97 | HR 68 | Resp 18 | Ht 72.0 in | Wt 190.0 lb

## 2023-01-25 DIAGNOSIS — Z89611 Acquired absence of right leg above knee: Secondary | ICD-10-CM

## 2023-01-28 ENCOUNTER — Encounter (INDEPENDENT_AMBULATORY_CARE_PROVIDER_SITE_OTHER): Payer: Self-pay | Admitting: Nurse Practitioner

## 2023-01-28 NOTE — Progress Notes (Addendum)
Subjective:    Patient ID: [name], male    DOB: 1966-09-07, 57 y.o.   MRN: [id number] Chief Complaint  Patient presents with   Follow-up    3 month follow up     [name] is a 57 year old male who underwent a right above-knee amputation on 11/24/2022.  Due to infection he initially required a guillotine amputation.  Today the wound is well-healed.  No evidence of dehiscence.  The patient is doing well with transfers and notes no significant phantom limb pain.  He has recently received his shrinker.  He is utilizing this regularly.  The patient is very motivated and excited to receive a prosthesis at this time.    Review of Systems  All other systems reviewed and are negative.      Objective:   Physical Exam Vitals reviewed.  HENT:     Head: Normocephalic.  Cardiovascular:     Rate and Rhythm: Normal rate.  Pulmonary:     Effort: Pulmonary effort is normal.  Skin:    General: Skin is warm and dry.  Neurological:     Mental Status: He is alert and oriented to person, place, and time.     Gait: Gait abnormal.  Psychiatric:        Mood and Affect: Mood normal.        Behavior: Behavior normal.        Thought Content: Thought content normal.        Judgment: Judgment normal.     BP (!) 154/97 (BP Location: Left Arm)   Pulse 68   Resp 18   Ht 6' (1.829 m)   Wt 190 lb (86.2 kg)   BMI 25.77 kg/m   Past Medical History:  Diagnosis Date   Diabetes mellitus without complication ([place])    Hyperlipidemia    Hypertension     Social History   Socioeconomic History   Marital status: Married    Spouse name: [name]    Number of children: 1   Years of education: Not on file   Highest education level: Not on file  Occupational History   Not on file  Tobacco Use   Smoking status: Never   Smokeless tobacco: Never  Vaping Use   Vaping Use: Never used  Substance and Sexual Activity   Alcohol use: Yes    Alcohol/week: 6.0 - 12.0 standard drinks of alcohol     Types: 6 - 12 Cans of beer per week    Comment: Occasional   Drug use: No   Sexual activity: Yes  Other Topics Concern   Not on file  Social History Narrative   Not on file   Social Determinants of Health   Financial Resource Strain: Low Risk  (07/02/2019)   Overall Financial Resource Strain (CARDIA)    Difficulty of Paying Living Expenses: Not hard at all  Food Insecurity: Food Insecurity Present (11/22/2022)   Hunger Vital Sign    Worried About Running Out of Food in the Last Year: Sometimes true    Ran Out of Food in the Last Year: Never true  Transportation Needs: No Transportation Needs (11/22/2022)   PRAPARE - Hydrologist (Medical): No    Lack of Transportation (Non-Medical): No  Recent Concern: Transportation Needs - Unmet Transportation Needs (11/22/2022)   PRAPARE - Hydrologist (Medical): Not on file    Lack of Transportation (Non-Medical): Yes  Physical Activity: Insufficiently  Active (07/02/2019)   Exercise Vital Sign    Days of Exercise per Week: 4 days    Minutes of Exercise per Session: 30 min  Stress: No Stress Concern Present (07/02/2019)   [place]    Feeling of Stress : Not at all  Social Connections: Not on file  Intimate Partner Violence: Not At Risk (11/22/2022)   Humiliation, Afraid, Rape, and Kick questionnaire    Fear of Current or Ex-Partner: No    Emotionally Abused: No    Physically Abused: No    Sexually Abused: No    Past Surgical History:  Procedure Laterality Date   AMPUTATION Right 04/16/2021   Procedure: AMPUTATION  RIGHT 5TH RAY, INCISION AND DRAINAGE;  Surgeon: Edrick Kins, DPM;  Location: [facility];  Service: Podiatry;  Laterality: Right;   AMPUTATION Right 11/24/2022   Procedure: AMPUTATION ABOVE KNEE;  Surgeon: Katha Cabal, MD;  Location: [facility];  Service: Vascular;  Laterality: Right;   APPLICATION OF  WOUND VAC Right 04/21/2021   Procedure: APPLICATION OF WOUND VAC;  Surgeon: Edrick Kins, DPM;  Location: [facility];  Service: Podiatry;  Laterality: Right;   BONE BIOPSY Left 06/27/2020   Procedure: BONE BIOPSY;  Surgeon: Caroline More, DPM;  Location: [facility];  Service: Podiatry;  Laterality: Left;   COLONOSCOPY WITH PROPOFOL N/A 06/22/2022   Procedure: COLONOSCOPY WITH PROPOFOL;  Surgeon: [name], MD;  Location: [facility];  Service: Gastroenterology;  Laterality: N/A;   I & D EXTREMITY Right 11/22/2022   Procedure: IRRIGATION AND DEBRIDEMENT EXTREMITY, below knee amputation right leg;  Surgeon: Jules Husbands, MD;  Location: [facility];  Service: General;  Laterality: Right;   INCISION AND DRAINAGE Left 06/27/2020   Procedure: INCISION AND DRAINAGE;  Surgeon: Caroline More, DPM;  Location: [facility];  Service: Podiatry;  Laterality: Left;   IRRIGATION AND DEBRIDEMENT FOOT Right 04/21/2021   Procedure: IRRIGATION AND DEBRIDEMENT FOOT;  Surgeon: Edrick Kins, DPM;  Location: [facility];  Service: Podiatry;  Laterality: Right;   LOWER EXTREMITY ANGIOGRAPHY Left 06/30/2020   Procedure: Lower Extremity Angiography;  Surgeon: Algernon Huxley, MD;  Location: [place] [facility];  Service: Cardiovascular;  Laterality: Left;   LOWER EXTREMITY ANGIOGRAPHY Right 04/14/2021   Procedure: Lower Extremity Angiography;  Surgeon: Algernon Huxley, MD;  Location: [place] [facility];  Service: Cardiovascular;  Laterality: Right;   NO PAST SURGERIES     WOUND DEBRIDEMENT Right 04/19/2021   Procedure: DEBRIDEMENT WOUND;  Surgeon: Edrick Kins, DPM;  Location: [facility];  Service: Podiatry;  Laterality: Right;    Family History  Problem Relation Age of Onset   Heart disease Father    Heart attack Father    Healthy Mother    Prostate cancer Neg Hx    Kidney cancer Neg Hx    Bladder Cancer Neg Hx     No Known Allergies     Latest Ref Rng & Units 11/30/2022    5:21 AM 11/29/2022    6:09 AM 11/27/2022     3:10 AM  CBC  WBC 4.0 - 10.5 K/uL 11.5  15.0  13.4   Hemoglobin 13.0 - 17.0 g/dL 7.7  7.3  8.7   Hematocrit 39.0 - 52.0 % 23.5  22.6  26.1   Platelets 150 - 400 K/uL 245  225  226       CMP     Component Value Date/Time   NA 137  11/29/2022 0609   NA 141 09/06/2022 1417   K 3.8 11/29/2022 0609   CL 105 11/29/2022 0609   CO2 27 11/29/2022 0609   GLUCOSE 201 (H) 11/29/2022 0609   BUN 22 (H) 11/29/2022 0609   BUN 25 (H) 09/06/2022 1417   CREATININE 0.78 11/29/2022 0609   CREATININE 0.79 11/09/2021 0902   CALCIUM 7.8 (L) 11/29/2022 0609   PROT 5.8 (L) 11/25/2022 0511   PROT 7.3 09/06/2022 1417   ALBUMIN 1.7 (L) 11/25/2022 0511   ALBUMIN 3.8 09/06/2022 1417   AST 19 11/25/2022 0511   ALT 9 11/25/2022 0511   ALKPHOS 62 11/25/2022 0511   BILITOT 1.2 11/25/2022 0511   BILITOT 1.5 (H) 09/06/2022 1417   GFRNONAA >60 11/29/2022 0609   GFRNONAA 83 02/09/2021 1135   GFRAA 96 02/09/2021 1135     No results found.     Assessment & Plan:   1. Hx of AKA (above knee amputation), right ([facility]) [name] is a right transfemoral amputee.  At this time he does not have any comorbidities that would impact his mobility or his ability to function with his prosthetic.  His limb is well-healed and ready to be fit with his first prosthesis.  He verbally communicates a strong desire to get a prosthesis.  Currently he is wheelchair-bound but is expected to be able to ambulate with no assistive devices with a prosthesis.  [name] is a K2 level ambulator that spends significant amount of time walking around inside and outside his home.  He will benefit from a K2 level prosthesis with a hydraulic ankle foot for improved stability.  Current Outpatient Medications on File Prior to Visit  Medication Sig Dispense Refill   Accu-Chek Softclix Lancets lancets USE TO TEST BLOOD SUGAR UP TO FOUR TIMES DAILY AS DIRECTED 300 each 1   acetaminophen (TYLENOL) 325 MG tablet Take 2 tablets (650 mg total) by mouth  every 6 (six) hours as needed for mild pain, fever or headache.     amLODipine (NORVASC) 5 MG tablet Take 2 tablets (10 mg total) by mouth daily. 90 tablet 0   empagliflozin (JARDIANCE) 25 MG TABS tablet Take 1 tablet (25 mg total) by mouth daily. 90 tablet 1   glipiZIDE (GLUCOTROL XL) 5 MG 24 hr tablet Take 1 tablet (5 mg total) by mouth daily with breakfast. 90 tablet 1   nystatin-triamcinolone ointment (MYCOLOG) Apply 1 Application topically 2 (two) times daily. Corners of mouth 30 g 0   prednisoLONE acetate (PRED FORTE) 1 % ophthalmic suspension 1 drop every 4 (four) hours.     rosuvastatin (CRESTOR) 40 MG tablet TAKE 1 TABLET(40 MG) BY MOUTH DAILY 90 tablet 1   Semaglutide (RYBELSUS) 14 MG TABS Take 1 tablet (14 mg total) by mouth daily. 90 tablet 1   tadalafil (CIALIS) 20 MG tablet Take 1 tablet (20 mg total) by mouth daily as needed for erectile dysfunction. 30 tablet 6   valsartan-hydrochlorothiazide (DIOVAN-HCT) 320-25 MG tablet Take 1 tablet by mouth daily. 90 tablet 0   No current facility-administered medications on file prior to visit.    There are no Patient Instructions on file for this visit. No follow-ups on file.   Georgiana Spinner, NP

## 2023-02-07 ENCOUNTER — Telehealth (INDEPENDENT_AMBULATORY_CARE_PROVIDER_SITE_OTHER): Payer: Self-pay | Admitting: Vascular Surgery

## 2023-02-07 NOTE — Telephone Encounter (Signed)
Patients wife called in stating that patient is needing a letter stating that patient is needing 24 hour care to give to social services. Stated she spoke NP Arna Medici and stated she would get it done if needed.   Please call wife if any questions or concerns    Please advise

## 2023-02-09 ENCOUNTER — Encounter (INDEPENDENT_AMBULATORY_CARE_PROVIDER_SITE_OTHER): Payer: Self-pay | Admitting: Nurse Practitioner

## 2023-02-09 NOTE — Telephone Encounter (Signed)
Letter completed and given to [place] to contact the patient

## 2023-02-13 DIAGNOSIS — H35352 Cystoid macular degeneration, left eye: Secondary | ICD-10-CM | POA: Diagnosis not present

## 2023-02-23 NOTE — Progress Notes (Unsigned)
Name: [name]   MRN: [id number]    DOB: December 19, 1965   Date:02/26/2023       Progress Note  Subjective  Chief Complaint  Follow Up  HPI  Dyslipidemia: taking Crestor daily, Last LDL still not at goal but down from 162 to 79, we will recheck labs today   History of gas gangrene : s/p right AKA done 11/22/2022.  He is getting evaluated to have a prosthesis and he is looking forward to being more active again    HTN: currently taking valsartan hctz 320/25 mg daily and norvasc 10 mg, his bp at home is usually controlled 117-130's/65-75's , he states today his bp at home was 165/90 and in our office bp was very high. He states he had pork yesterday and it usually causes it to spike. I asked him to return later this week to have bp rechecked but he refused it   DMII: A1C during hospital stay was 7.3 % but today it is up to 7.9 % , he is not sure why the level is going up. He states he only drinks water, usually snacks on nuts, occasionally pretzel and popcorn, he eats once a day and sometimes has carbohydrates on his meals. He is taking Jardiance, Rybelsus and Glipizide.Marland Kitchen He states his glucose has been controlled lately, usually 116-140's. He denies polyphagia, polydipsia or polyuria. He has associated PAD, diabetic retinopathy, dyslipidemia and microalbuminuria. He is on statin therapy, ARB and aspirin daily    Patient Active Problem List   Diagnosis Date Noted   History of above knee amputation, right ([place]) 12/13/2022   Elevated troponin 11/21/2022   B12 deficiency 09/05/2022   Proliferative diabetic retinopathy of both eyes without macular edema associated with type 2 diabetes mellitus ([place]) 06/05/2022   PAD (peripheral artery disease) ([place]) 04/12/2021   Secondary renal hyperparathyroidism ([place]) 03/19/2019   Dyslipidemia associated with type 2 diabetes mellitus ([place]) 07/03/2018   Vitamin D deficiency 04/11/2018   ED (erectile dysfunction) 04/10/2018   Lower urinary tract symptoms (LUTS)  02/09/2017   Hypertension 07/05/2015   Hyperglycemia due to type 2 diabetes mellitus ([place]) 07/05/2015   HLD (hyperlipidemia) 07/05/2015    Past Surgical History:  Procedure Laterality Date   AMPUTATION Right 04/16/2021   Procedure: AMPUTATION  RIGHT 5TH RAY, INCISION AND DRAINAGE;  Surgeon: Edrick Kins, DPM;  Location: [facility];  Service: Podiatry;  Laterality: Right;   AMPUTATION Right 11/24/2022   Procedure: AMPUTATION ABOVE KNEE;  Surgeon: Katha Cabal, MD;  Location: [facility];  Service: Vascular;  Laterality: Right;   APPLICATION OF WOUND VAC Right 04/21/2021   Procedure: APPLICATION OF WOUND VAC;  Surgeon: Edrick Kins, DPM;  Location: [facility];  Service: Podiatry;  Laterality: Right;   BONE BIOPSY Left 06/27/2020   Procedure: BONE BIOPSY;  Surgeon: Caroline More, DPM;  Location: [facility];  Service: Podiatry;  Laterality: Left;   COLONOSCOPY WITH PROPOFOL N/A 06/22/2022   Procedure: COLONOSCOPY WITH PROPOFOL;  Surgeon: [name], MD;  Location: [facility];  Service: Gastroenterology;  Laterality: N/A;   I & D EXTREMITY Right 11/22/2022   Procedure: IRRIGATION AND DEBRIDEMENT EXTREMITY, below knee amputation right leg;  Surgeon: Jules Husbands, MD;  Location: [facility];  Service: General;  Laterality: Right;   INCISION AND DRAINAGE Left 06/27/2020   Procedure: INCISION AND DRAINAGE;  Surgeon: Caroline More, DPM;  Location: [facility];  Service: Podiatry;  Laterality: Left;   IRRIGATION AND DEBRIDEMENT FOOT Right 04/21/2021   Procedure:  IRRIGATION AND DEBRIDEMENT FOOT;  Surgeon: Edrick Kins, DPM;  Location: [facility];  Service: Podiatry;  Laterality: Right;   LOWER EXTREMITY ANGIOGRAPHY Left 06/30/2020   Procedure: Lower Extremity Angiography;  Surgeon: Algernon Huxley, MD;  Location: [place] [facility];  Service: Cardiovascular;  Laterality: Left;   LOWER EXTREMITY ANGIOGRAPHY Right 04/14/2021   Procedure: Lower Extremity Angiography;  Surgeon: Algernon Huxley, MD;  Location: [place] [facility];  Service: Cardiovascular;  Laterality: Right;   NO PAST SURGERIES     WOUND DEBRIDEMENT Right 04/19/2021   Procedure: DEBRIDEMENT WOUND;  Surgeon: Edrick Kins, DPM;  Location: [facility];  Service: Podiatry;  Laterality: Right;    Family History  Problem Relation Age of Onset   Heart disease Father    Heart attack Father    Healthy Mother    Prostate cancer Neg Hx    Kidney cancer Neg Hx    Bladder Cancer Neg Hx     Social History   Tobacco Use   Smoking status: Never   Smokeless tobacco: Never  Substance Use Topics   Alcohol use: Yes    Alcohol/week: 6.0 - 12.0 standard drinks of alcohol    Types: 6 - 12 Cans of beer per week    Comment: Occasional     Current Outpatient Medications:    Accu-Chek Softclix Lancets lancets, USE TO TEST BLOOD SUGAR UP TO FOUR TIMES DAILY AS DIRECTED, Disp: 300 each, Rfl: 1   acetaminophen (TYLENOL) 325 MG tablet, Take 2 tablets (650 mg total) by mouth every 6 (six) hours as needed for mild pain, fever or headache., Disp: , Rfl:    glipiZIDE (GLUCOTROL XL) 5 MG 24 hr tablet, Take 1 tablet (5 mg total) by mouth daily with breakfast., Disp: 90 tablet, Rfl: 1   nystatin-triamcinolone ointment (MYCOLOG), Apply 1 Application topically 2 (two) times daily. Corners of mouth, Disp: 30 g, Rfl: 0   prednisoLONE acetate (PRED FORTE) 1 % ophthalmic suspension, 1 drop every 4 (four) hours., Disp: , Rfl:    rosuvastatin (CRESTOR) 40 MG tablet, TAKE 1 TABLET(40 MG) BY MOUTH DAILY, Disp: 90 tablet, Rfl: 1   Semaglutide (RYBELSUS) 14 MG TABS, Take 1 tablet (14 mg total) by mouth daily., Disp: 90 tablet, Rfl: 1   tadalafil (CIALIS) 20 MG tablet, Take 1 tablet (20 mg total) by mouth daily as needed for erectile dysfunction., Disp: 30 tablet, Rfl: 6   amLODipine (NORVASC) 10 MG tablet, Take 1 tablet (10 mg total) by mouth daily., Disp: 90 tablet, Rfl: 0   empagliflozin (JARDIANCE) 25 MG TABS tablet, Take 1 tablet (25 mg total) by mouth daily., Disp:  90 tablet, Rfl: 1   valsartan-hydrochlorothiazide (DIOVAN-HCT) 320-25 MG tablet, Take 1 tablet by mouth daily., Disp: 90 tablet, Rfl: 0  No Known Allergies  I personally reviewed active problem list, medication list, allergies, family history, social history, health maintenance with the patient/caregiver today.   ROS  Ten systems reviewed and is negative except as mentioned in HPI   Objective  Vitals:   02/26/23 1506 02/26/23 1518  BP: (!) 170/96 (!) 190/98  Pulse: (!) 55   Resp: 16   Temp: 97.9 F (36.6 C)   TempSrc: Oral   SpO2: 99%   Weight: 190 lb (86.2 kg)   Height: 5\' 11"  (1.803 m)     Body mass index is 26.5 kg/m.  Physical Exam  Constitutional: Patient appears well-developed and well-nourished.  No distress.  HEENT: head atraumatic, normocephalic,  pupils equal and reactive to light,, neck supple Cardiovascular: Normal rate, regular rhythm and normal heart sounds.  No murmur heard. No BLE edema. Pulmonary/Chest: Effort normal and breath sounds normal. No respiratory distress. Abdominal: Soft.  There is no tenderness. Muscular skeletal: AKA right side  Psychiatric: Patient has a normal mood and affect. behavior is normal. Judgment and thought content normal.    PHQ2/9:    02/26/2023    3:08 PM 12/13/2022    3:29 PM 12/08/2022   11:14 AM 09/05/2022    3:15 PM 06/05/2022    3:13 PM  Depression screen PHQ 2/9  Decreased Interest 0 0 0 0 0  Down, Depressed, Hopeless 0 0 0 0 0  PHQ - 2 Score 0 0 0 0 0  Altered sleeping 0 0 0 0   Tired, decreased energy 0 0 0 0   Change in appetite 0 0 0 0   Feeling bad or failure about yourself  0 0 0 0   Trouble concentrating 0 0 0 0   Moving slowly or fidgety/restless 0 0 0 0   Suicidal thoughts 0 0 0 0   PHQ-9 Score 0 0 0 0     phq 9 is negative   Fall Risk:    02/26/2023    3:08 PM 12/13/2022    3:29 PM 12/08/2022   11:14 AM 09/05/2022    3:15 PM 06/05/2022    3:13 PM  Fall Risk   Falls in the past year? 0 1 1 0 0   Number falls in past yr:  1 1 0 0  Injury with Fall?  0 0 0 0  Risk for fall due to : Impaired mobility Impaired balance/gait;Impaired mobility History of fall(s);Impaired mobility;Impaired balance/gait No Fall Risks No Fall Risks  Follow up Falls prevention discussed;Education provided;Falls evaluation completed Falls prevention discussed Falls prevention discussed Falls prevention discussed Falls prevention discussed     Assessment & Plan  1. Dyslipidemia associated with type 2 diabetes mellitus (HCC)  - POCT HgB A1C - Lipid panel - empagliflozin (JARDIANCE) 25 MG TABS tablet; Take 1 tablet (25 mg total) by mouth daily.  Dispense: 90 tablet; Refill: 1  2. S/P above knee amputation, right ([place])  Getting fitted for prosthesis  3. PAD (peripheral artery disease) (HCC)  That resulted on AKA, on statin therapy, needs to get bp under control   4. Essential hypertension  - CBC with Differential/Platelet - COMPLETE METABOLIC PANEL WITH GFR - amLODipine (NORVASC) 10 MG tablet; Take 1 tablet (10 mg total) by mouth daily.  Dispense: 90 tablet; Refill: 0 - valsartan-hydrochlorothiazide (DIOVAN-HCT) 320-25 MG tablet; Take 1 tablet by mouth daily.  Dispense: 90 tablet; Refill: 0  5. Type 2 diabetes mellitus with microalbuminuria, without long-term current use of insulin ([place])  Seeing nephrologist   6. B12 deficiency  - CBC with Differential/Platelet - B12 and Folate Panel  7. Vitamin D deficiency  - VITAMIN D 25 Hydroxy (Vit-D Deficiency, Fractures)  8. Secondary renal hyperparathyroidism (HCC)  - PTH, intact and calcium  9. History of blood transfusion  - CBC with Differential/Platelet - Iron, TIBC and Ferritin Panel  10. Proliferative diabetic retinopathy of both eyes without macular edema associated with type 2 diabetes mellitus ([place])   Up to date with eye doctor

## 2023-02-26 ENCOUNTER — Encounter: Payer: Self-pay | Admitting: Family Medicine

## 2023-02-26 ENCOUNTER — Ambulatory Visit: Payer: Medicaid Other | Admitting: Family Medicine

## 2023-02-26 VITALS — BP 190/98 | HR 55 | Temp 97.9°F | Resp 16 | Ht 71.0 in | Wt 190.0 lb

## 2023-02-26 DIAGNOSIS — E538 Deficiency of other specified B group vitamins: Secondary | ICD-10-CM

## 2023-02-26 DIAGNOSIS — E113593 Type 2 diabetes mellitus with proliferative diabetic retinopathy without macular edema, bilateral: Secondary | ICD-10-CM | POA: Diagnosis not present

## 2023-02-26 DIAGNOSIS — Z9289 Personal history of other medical treatment: Secondary | ICD-10-CM

## 2023-02-26 DIAGNOSIS — E1169 Type 2 diabetes mellitus with other specified complication: Secondary | ICD-10-CM

## 2023-02-26 DIAGNOSIS — I10 Essential (primary) hypertension: Secondary | ICD-10-CM

## 2023-02-26 DIAGNOSIS — E1129 Type 2 diabetes mellitus with other diabetic kidney complication: Secondary | ICD-10-CM

## 2023-02-26 DIAGNOSIS — E559 Vitamin D deficiency, unspecified: Secondary | ICD-10-CM | POA: Diagnosis not present

## 2023-02-26 DIAGNOSIS — R809 Proteinuria, unspecified: Secondary | ICD-10-CM

## 2023-02-26 DIAGNOSIS — Z89611 Acquired absence of right leg above knee: Secondary | ICD-10-CM | POA: Diagnosis not present

## 2023-02-26 DIAGNOSIS — N2581 Secondary hyperparathyroidism of renal origin: Secondary | ICD-10-CM

## 2023-02-26 DIAGNOSIS — I739 Peripheral vascular disease, unspecified: Secondary | ICD-10-CM

## 2023-02-26 DIAGNOSIS — E785 Hyperlipidemia, unspecified: Secondary | ICD-10-CM | POA: Diagnosis not present

## 2023-02-26 LAB — POCT GLYCOSYLATED HEMOGLOBIN (HGB A1C): Hemoglobin A1C: 7.9 % — AB (ref 4.0–5.6)

## 2023-02-26 MED ORDER — VALSARTAN-HYDROCHLOROTHIAZIDE 320-25 MG PO TABS: 1.0000 | ORAL_TABLET | Freq: Every day | ORAL | 0 refills | Status: DC

## 2023-02-26 MED ORDER — EMPAGLIFLOZIN 25 MG PO TABS: 25.0000 mg | ORAL_TABLET | Freq: Every day | ORAL | 1 refills | Status: DC

## 2023-02-26 MED ORDER — AMLODIPINE BESYLATE 10 MG PO TABS: 10.0000 mg | ORAL_TABLET | Freq: Every day | ORAL | 0 refills | Status: DC

## 2023-02-27 LAB — COMPLETE METABOLIC PANEL WITH GFR
AG Ratio: 1.2 (calc) (ref 1.0–2.5)
ALT: 22 U/L (ref 9–46)
AST: 14 U/L (ref 10–35)
Albumin: 3.9 g/dL (ref 3.6–5.1)
Alkaline phosphatase (APISO): 76 U/L (ref 35–144)
BUN/Creatinine Ratio: 34 (calc) — ABNORMAL HIGH (ref 6–22)
BUN: 29 mg/dL — ABNORMAL HIGH (ref 7–25)
CO2: 28 mmol/L (ref 20–32)
Calcium: 10.4 mg/dL — ABNORMAL HIGH (ref 8.6–10.3)
Chloride: 103 mmol/L (ref 98–110)
Creat: 0.85 mg/dL (ref 0.70–1.30)
Globulin: 3.3 g/dL (calc) (ref 1.9–3.7)
Glucose, Bld: 92 mg/dL (ref 65–99)
Potassium: 5.1 mmol/L (ref 3.5–5.3)
Sodium: 140 mmol/L (ref 135–146)
Total Bilirubin: 2.3 mg/dL — ABNORMAL HIGH (ref 0.2–1.2)
Total Protein: 7.2 g/dL (ref 6.1–8.1)
eGFR: 102 mL/min/{1.73_m2} (ref >=60)

## 2023-02-27 LAB — CBC WITH DIFFERENTIAL/PLATELET
Absolute Monocytes: 542 cells/uL (ref 200–950)
Basophils Absolute: 63 cells/uL (ref 0–200)
Basophils Relative: 1 %
Eosinophils Absolute: 139 cells/uL (ref 15–500)
Eosinophils Relative: 2.2 %
HCT: 46 % (ref 38.5–50.0)
Hemoglobin: 15.4 g/dL (ref 13.2–17.1)
Lymphs Abs: 1953 cells/uL (ref 850–3900)
MCH: 29.4 pg (ref 27.0–33.0)
MCHC: 33.5 g/dL (ref 32.0–36.0)
MCV: 87.8 fL (ref 80.0–100.0)
MPV: 10.5 fL (ref 7.5–12.5)
Monocytes Relative: 8.6 %
Neutro Abs: 3604 cells/uL (ref 1500–7800)
Neutrophils Relative %: 57.2 %
Platelets: 256 10*3/uL (ref 140–400)
RBC: 5.24 10*6/uL (ref 4.20–5.80)
RDW: 12 % (ref 11.0–15.0)
Total Lymphocyte: 31 %
WBC: 6.3 10*3/uL (ref 3.8–10.8)

## 2023-02-27 LAB — PTH, INTACT AND CALCIUM
Calcium: 10.4 mg/dL — ABNORMAL HIGH (ref 8.6–10.3)
PTH: 64 pg/mL (ref 16–77)

## 2023-02-27 LAB — LIPID PANEL
Cholesterol: 220 mg/dL — ABNORMAL HIGH (ref <200)
HDL: 85 mg/dL (ref >=40)
LDL Cholesterol (Calc): 118 mg/dL (calc) — ABNORMAL HIGH
Non-HDL Cholesterol (Calc): 135 mg/dL (calc) — ABNORMAL HIGH (ref <130)
Total CHOL/HDL Ratio: 2.6 (calc) (ref <5.0)
Triglycerides: 78 mg/dL (ref <150)

## 2023-02-27 LAB — B12 AND FOLATE PANEL
Folate: 9.2 ng/mL
Vitamin B-12: 366 pg/mL (ref 200–1100)

## 2023-02-27 LAB — IRON,TIBC AND FERRITIN PANEL
%SAT: 33 % (calc) (ref 20–48)
Ferritin: 31 ng/mL — ABNORMAL LOW (ref 38–380)
Iron: 136 ug/dL (ref 50–180)
TIBC: 407 mcg/dL (calc) (ref 250–425)

## 2023-02-27 LAB — VITAMIN D 25 HYDROXY (VIT D DEFICIENCY, FRACTURES): Vit D, 25-Hydroxy: 34 ng/mL (ref 30–100)

## 2023-02-28 ENCOUNTER — Other Ambulatory Visit: Payer: Self-pay

## 2023-02-28 DIAGNOSIS — E559 Vitamin D deficiency, unspecified: Secondary | ICD-10-CM

## 2023-02-28 DIAGNOSIS — E538 Deficiency of other specified B group vitamins: Secondary | ICD-10-CM

## 2023-02-28 DIAGNOSIS — E1169 Type 2 diabetes mellitus with other specified complication: Secondary | ICD-10-CM

## 2023-02-28 DIAGNOSIS — E611 Iron deficiency: Secondary | ICD-10-CM

## 2023-02-28 MED ORDER — IRON (FERROUS SULFATE) 325 (65 FE) MG PO TABS: 325.0000 mg | ORAL_TABLET | Freq: Every day | ORAL | 1 refills | Status: DC

## 2023-02-28 MED ORDER — VITAMIN D3 50 MCG (2000 UT) PO CAPS: 2000.0000 [IU] | ORAL_CAPSULE | Freq: Every day | ORAL | 1 refills | Status: AC

## 2023-02-28 MED ORDER — VITAMIN B-12 1000 MCG PO TABS: 1000.0000 ug | ORAL_TABLET | Freq: Every day | ORAL | 1 refills | Status: AC

## 2023-02-28 MED ORDER — GLIPIZIDE ER 5 MG PO TB24: 5.0000 mg | ORAL_TABLET | Freq: Every day | ORAL | 1 refills | Status: DC

## 2023-02-28 MED ORDER — RYBELSUS 14 MG PO TABS: 14.0000 mg | ORAL_TABLET | Freq: Every day | ORAL | 1 refills | Status: DC

## 2023-03-08 DIAGNOSIS — Z89611 Acquired absence of right leg above knee: Secondary | ICD-10-CM | POA: Diagnosis not present

## 2023-03-12 ENCOUNTER — Telehealth (INDEPENDENT_AMBULATORY_CARE_PROVIDER_SITE_OTHER): Payer: Self-pay | Admitting: Nurse Practitioner

## 2023-03-12 NOTE — Telephone Encounter (Signed)
Patient called in stating he is needing a referral sent in for physical therapy. He is wanting it go to [facility]    Please call and advise

## 2023-03-13 ENCOUNTER — Other Ambulatory Visit (INDEPENDENT_AMBULATORY_CARE_PROVIDER_SITE_OTHER): Payer: Self-pay | Admitting: Nurse Practitioner

## 2023-03-13 DIAGNOSIS — Z89611 Acquired absence of right leg above knee: Secondary | ICD-10-CM

## 2023-03-13 NOTE — Telephone Encounter (Signed)
Referral placed.

## 2023-03-21 ENCOUNTER — Other Ambulatory Visit: Payer: Self-pay

## 2023-03-21 ENCOUNTER — Telehealth (INDEPENDENT_AMBULATORY_CARE_PROVIDER_SITE_OTHER): Payer: Self-pay

## 2023-03-21 ENCOUNTER — Telehealth: Payer: Self-pay

## 2023-03-21 DIAGNOSIS — Z89611 Acquired absence of right leg above knee: Secondary | ICD-10-CM

## 2023-03-21 NOTE — Telephone Encounter (Signed)
Patient has been notified and verbalized understanding 

## 2023-03-21 NOTE — Telephone Encounter (Signed)
I placed a referral to PT at [facility]  [place] on 03/13/2023

## 2023-03-21 NOTE — Telephone Encounter (Signed)
Patient left a message requesting referral for physical therapy. Please Advise

## 2023-03-21 NOTE — Patient Outreach (Signed)
The patient was given information about care management services as a benefit of their Medicaid health plan today.   Patient                                              did not agree to enrollment in care management services and does not wish to consider at this time.   BSW will reach out to PCP about shower chair.  Abelino Derrick, MHA [facility]  Managed [facility] Social Worker [phone]

## 2023-03-22 ENCOUNTER — Ambulatory Visit (INDEPENDENT_AMBULATORY_CARE_PROVIDER_SITE_OTHER): Payer: Medicaid Other | Admitting: Vascular Surgery

## 2023-04-17 DIAGNOSIS — H35352 Cystoid macular degeneration, left eye: Secondary | ICD-10-CM | POA: Diagnosis not present

## 2023-04-17 DIAGNOSIS — E113593 Type 2 diabetes mellitus with proliferative diabetic retinopathy without macular edema, bilateral: Secondary | ICD-10-CM | POA: Diagnosis not present

## 2023-04-24 ENCOUNTER — Other Ambulatory Visit (INDEPENDENT_AMBULATORY_CARE_PROVIDER_SITE_OTHER): Payer: Self-pay | Admitting: Nurse Practitioner

## 2023-04-24 DIAGNOSIS — Z89611 Acquired absence of right leg above knee: Secondary | ICD-10-CM

## 2023-04-25 NOTE — Progress Notes (Signed)
MRN : [id number]  [name] is a 57 y.o. (20-Oct-1966) male who presents with chief complaint of check circulation.  History of Present Illness:  The patient returns to the office for followup and review of the noninvasive studies.   There have been no interval changes in lower extremity symptoms. No interval shortening of the patient's claudication distance or development of rest pain symptoms. No new ulcers or wounds have occurred since the last visit.  There have been no significant changes to the patient's overall health care.  The patient denies amaurosis fugax or recent TIA symptoms. There are no documented recent neurological changes noted. There is no history of DVT, PE or superficial thrombophlebitis. The patient denies recent episodes of angina or shortness of breath.   ABI Rt=AKA and Lt=1.05     No outpatient medications have been marked as taking for the 04/26/23 encounter (Appointment) with Gilda Crease, Latina Craver, MD.    Past Medical History:  Diagnosis Date   Diabetes mellitus without complication (HCC)    Hyperlipidemia    Hypertension     Past Surgical History:  Procedure Laterality Date   AMPUTATION Right 04/16/2021   Procedure: AMPUTATION  RIGHT 5TH RAY, INCISION AND DRAINAGE;  Surgeon: Felecia Shelling, DPM;  Location: [facility];  Service: Podiatry;  Laterality: Right;   AMPUTATION Right 11/24/2022   Procedure: AMPUTATION ABOVE KNEE;  Surgeon: Renford Dills, MD;  Location: [facility];  Service: Vascular;  Laterality: Right;   APPLICATION OF WOUND VAC Right 04/21/2021   Procedure: APPLICATION OF WOUND VAC;  Surgeon: Felecia Shelling, DPM;  Location: [facility];  Service: Podiatry;  Laterality: Right;   BONE BIOPSY Left 06/27/2020   Procedure: BONE BIOPSY;  Surgeon: Rosetta Posner, DPM;  Location: [facility];  Service: Podiatry;  Laterality: Left;   COLONOSCOPY WITH PROPOFOL N/A 06/22/2022   Procedure:  COLONOSCOPY WITH PROPOFOL;  Surgeon: Wyline Mood, MD;  Location: [facility] END[facility]COPY;  Service: Gastroenterology;  Laterality: N/A;   I & D EXTREMITY Right 11/22/2022   Procedure: IRRIGATION AND DEBRIDEMENT EXTREMITY, below knee amputation right leg;  Surgeon: Leafy Ro, MD;  Location: [facility];  Service: General;  Laterality: Right;   INCISION AND DRAINAGE Left 06/27/2020   Procedure: INCISION AND DRAINAGE;  Surgeon: Rosetta Posner, DPM;  Location: [facility];  Service: Podiatry;  Laterality: Left;   IRRIGATION AND DEBRIDEMENT FOOT Right 04/21/2021   Procedure: IRRIGATION AND DEBRIDEMENT FOOT;  Surgeon: Felecia Shelling, DPM;  Location: [facility];  Service: Podiatry;  Laterality: Right;   LOWER EXTREMITY ANGIOGRAPHY Left 06/30/2020   Procedure: Lower Extremity Angiography;  Surgeon: Annice Needy, MD;  Location: [facility];  Service: Cardiovascular;  Laterality: Left;   LOWER EXTREMITY ANGIOGRAPHY Right 04/14/2021   Procedure: Lower Extremity Angiography;  Surgeon: Annice Needy, MD;  Location: [facility];  Service: Cardiovascular;  Laterality: Right;   NO PAST SURGERIES     WOUND DEBRIDEMENT Right 04/19/2021   Procedure: DEBRIDEMENT WOUND;  Surgeon: Felecia Shelling, DPM;  Location: [facility];  Service: Podiatry;  Laterality: Right;  Social History Social History   Tobacco Use   Smoking status: Never   Smokeless tobacco: Never  Vaping Use   Vaping Use: Never used  Substance Use Topics   Alcohol use: Yes    Alcohol/week: 6.0 - 12.0 standard drinks of alcohol    Types: 6 - 12 Cans of beer per week    Comment: Occasional   Drug use: No    Family History Family History  Problem Relation Age of Onset   Heart disease Father    Heart attack Father    Healthy Mother    Prostate cancer Neg Hx    Kidney cancer Neg Hx    Bladder Cancer Neg Hx     No Known Allergies   REVIEW OF SYSTEMS (Negative unless checked)  Constitutional: [] Weight loss  [] Fever  [] Chills Cardiac:  [] Chest pain   [] Chest pressure   [] Palpitations   [] Shortness of breath when laying flat   [] Shortness of breath with exertion. Vascular:  [x] Pain in legs with walking   [] Pain in legs at rest  [] History of DVT   [] Phlebitis   [] Swelling in legs   [] Varicose veins   [] Non-healing ulcers Pulmonary:   [] Uses home oxygen   [] Productive cough   [] Hemoptysis   [] Wheeze  [] COPD   [] Asthma Neurologic:  [] Dizziness   [] Seizures   [] History of stroke   [] History of TIA  [] Aphasia   [] Vissual changes   [] Weakness or numbness in arm   [] Weakness or numbness in leg Musculoskeletal:   [] Joint swelling   [] Joint pain   [] Low back pain Hematologic:  [] Easy bruising  [] Easy bleeding   [] Hypercoagulable state   [] Anemic Gastrointestinal:  [] Diarrhea   [] Vomiting  [] Gastroesophageal reflux/heartburn   [] Difficulty swallowing. Genitourinary:  [] Chronic kidney disease   [] Difficult urination  [] Frequent urination   [] Blood in urine Skin:  [] Rashes   [] Ulcers  Psychological:  [] History of anxiety   []  History of major depression.  Physical Examination  There were no vitals filed for this visit. There is no height or weight on file to calculate BMI. Gen: WD/WN, NAD Head: [place]/AT, No temporalis wasting.  Ear/Nose/Throat: Hearing grossly intact, nares w/o erythema or drainage Eyes: PER, EOMI, sclera nonicteric.  Neck: Supple, no masses.  No bruit or JVD.  Pulmonary:  Good air movement, no audible wheezing, no use of accessory muscles.  Cardiac: RRR, normal S1, S2, no Murmurs. Vascular:  mild trophic changes, no open wounds Vessel Right Left  Radial Palpable Palpable  PT AKA Trace Palpable  DP AKA Trace Palpable  Gastrointestinal: soft, non-distended. No guarding/no peritoneal signs.  Musculoskeletal: M/S 5/5 throughout.  No visible deformity.  Neurologic: CN 2-12 intact. Pain and light touch intact in extremities.  Symmetrical.  Speech is fluent. Motor exam as listed above. Psychiatric: Judgment intact, Mood &  affect appropriate for pt's clinical situation. Dermatologic: No rashes or ulcers noted.  No changes consistent with cellulitis.   CBC Lab Results  Component Value Date   WBC 6.3 02/26/2023   HGB 15.4 02/26/2023   HCT 46.0 02/26/2023   MCV 87.8 02/26/2023   PLT 256 02/26/2023    BMET    Component Value Date/Time   NA 140 02/26/2023 1556   NA 141 09/06/2022 1417   K 5.1 02/26/2023 1556   CL 103 02/26/2023 1556   CO2 28 02/26/2023 1556   GLUCOSE 92 02/26/2023 1556   BUN 29 (H) 02/26/2023 1556   BUN 25 (H) 09/06/2022 1417   CREATININE  0.85 02/26/2023 1556   CALCIUM 10.4 (H) 02/26/2023 1556   CALCIUM 10.4 (H) 02/26/2023 1556   GFRNONAA >60 11/29/2022 0609   GFRNONAA 83 02/09/2021 1135   GFRAA 96 02/09/2021 1135   CrCl cannot be calculated (Patient's most recent lab result is older than the maximum 21 days allowed.).  COAG No results found for: "INR", "PROTIME"  Radiology No results found.   Assessment/Plan 1. PAD (peripheral artery disease) (HCC)  Recommend:  The patient has evidence of atherosclerosis of the lower extremities with claudication.  The patient does not voice lifestyle limiting changes at this point in time.  Noninvasive studies do not suggest clinically significant change.  No invasive studies, angiography or surgery at this time The patient should continue walking and begin a more formal exercise program.  The patient should continue antiplatelet therapy and aggressive treatment of the lipid abnormalities  No changes in the patient's medications at this time  Continued surveillance is indicated as atherosclerosis is likely to progress with time.    The patient will continue follow up with noninvasive studies as ordered.  - VAS [place] ABI WITH/WO TBI; Future  2. Primary hypertension Continue antihypertensive medications as already ordered, these medications have been reviewed and there are no changes at this time.  3. Type 2 diabetes mellitus with  hyperglycemia, without long-term current use of insulin (HCC) Continue hypoglycemic medications as already ordered, these medications have been reviewed and there are no changes at this time.  Hgb A1C to be monitored as already arranged by primary service  4. Mixed hyperlipidemia Continue statin as ordered and reviewed, no changes at this time  5. History of above knee amputation, right (HCC) Currently working with therapy.  He is in the process of getting his prosthesis    Levora Dredge, MD  04/25/2023 3:32 PM

## 2023-04-25 NOTE — Therapy (Signed)
OUTPATIENT PHYSICAL THERAPY NEURO EVALUATION   Patient Name: [name] MRN: [id number] DOB:1966/05/27, 57 y.o., male Today's Date: 04/27/2023   PCP:   Alba Cory, MD   REFERRING PROVIDER: Georgiana Spinner, NP   END OF SESSION:  PT End of Session - 04/27/23 1026     Visit Number 1    Number of Visits 16    Date for PT Re-Evaluation 06/22/23    PT Start Time 1022    PT Stop Time 1100    PT Time Calculation (min) 38 min             Past Medical History:  Diagnosis Date   Diabetes mellitus without complication (HCC)    Hyperlipidemia    Hypertension    Past Surgical History:  Procedure Laterality Date   AMPUTATION Right 04/16/2021   Procedure: AMPUTATION  RIGHT 5TH RAY, INCISION AND DRAINAGE;  Surgeon: Felecia Shelling, DPM;  Location: [facility];  Service: Podiatry;  Laterality: Right;   AMPUTATION Right 11/24/2022   Procedure: AMPUTATION ABOVE KNEE;  Surgeon: Renford Dills, MD;  Location: [facility];  Service: Vascular;  Laterality: Right;   APPLICATION OF WOUND VAC Right 04/21/2021   Procedure: APPLICATION OF WOUND VAC;  Surgeon: Felecia Shelling, DPM;  Location: [facility];  Service: Podiatry;  Laterality: Right;   BONE BIOPSY Left 06/27/2020   Procedure: BONE BIOPSY;  Surgeon: Rosetta Posner, DPM;  Location: [facility];  Service: Podiatry;  Laterality: Left;   COLONOSCOPY WITH PROPOFOL N/A 06/22/2022   Procedure: COLONOSCOPY WITH PROPOFOL;  Surgeon: Wyline Mood, MD;  Location: [facility] ENDOSCOPY;  Service: Gastroenterology;  Laterality: N/A;   I & D EXTREMITY Right 11/22/2022   Procedure: IRRIGATION AND DEBRIDEMENT EXTREMITY, below knee amputation right leg;  Surgeon: Leafy Ro, MD;  Location: [facility];  Service: General;  Laterality: Right;   INCISION AND DRAINAGE Left 06/27/2020   Procedure: INCISION AND DRAINAGE;  Surgeon: Rosetta Posner, DPM;  Location: [facility];  Service: Podiatry;  Laterality: Left;   IRRIGATION AND DEBRIDEMENT FOOT Right 04/21/2021   Procedure:  IRRIGATION AND DEBRIDEMENT FOOT;  Surgeon: Felecia Shelling, DPM;  Location: [facility];  Service: Podiatry;  Laterality: Right;   LOWER EXTREMITY ANGIOGRAPHY Left 06/30/2020   Procedure: Lower Extremity Angiography;  Surgeon: Annice Needy, MD;  Location: [facility];  Service: Cardiovascular;  Laterality: Left;   LOWER EXTREMITY ANGIOGRAPHY Right 04/14/2021   Procedure: Lower Extremity Angiography;  Surgeon: Annice Needy, MD;  Location: [facility];  Service: Cardiovascular;  Laterality: Right;   NO PAST SURGERIES     WOUND DEBRIDEMENT Right 04/19/2021   Procedure: DEBRIDEMENT WOUND;  Surgeon: Felecia Shelling, DPM;  Location: [facility];  Service: Podiatry;  Laterality: Right;   Patient Active Problem List   Diagnosis Date Noted   History of above knee amputation, right (HCC) 12/13/2022   Elevated troponin 11/21/2022   B12 deficiency 09/05/2022   Proliferative diabetic retinopathy of both eyes without macular edema associated with type 2 diabetes mellitus (HCC) 06/05/2022   PAD (peripheral artery disease) (HCC) 04/12/2021   Secondary renal hyperparathyroidism (HCC) 03/19/2019   Dyslipidemia associated with type 2 diabetes mellitus (HCC) 07/03/2018   Vitamin D deficiency 04/11/2018   ED (erectile dysfunction) 04/10/2018   Lower urinary tract symptoms (LUTS) 02/09/2017   Hypertension 07/05/2015   Hyperglycemia due to type 2 diabetes mellitus (HCC) 07/05/2015   HLD (hyperlipidemia) 07/05/2015    ONSET DATE: 11/25/23  REFERRING DIAG: W09.811 (  ICD-10-CM) - History of above knee amputation, right (HCC)   THERAPY DIAG:  Abnormality of gait and mobility  Difficulty in walking, not elsewhere classified  Muscle weakness (generalized)  Other abnormalities of gait and mobility  Rationale for Evaluation and Treatment: Rehabilitation  SUBJECTIVE:                                                                                                                                                                                              SUBJECTIVE STATEMENT: Pt has been walking with the walker at home. Pt got prosthesis about 1 month ago. He has about 3/4 of femur left. No falls since he had the surgery. Pt is comfortable with putting on prosthesis and taking it off. Prior to infection pt was working out at [employer] 5-6 days per week. He used ot do treadmill, stationary bike and elliptical. Pt stopped in December immediately prior to the amputation. Pt has been doing HEP from home health and walking with walker in prosthetic.  Pt accompanied by: significant other  PERTINENT HISTORY: per referring note Jesten Mcclelland is a right transfemoral amputee.  At this time he does not have any comorbidities that would impact his mobility or his ability to function with his prosthetic.  His limb is well-healed and ready to be fit with his first prosthesis.  He verbally communicates a strong desire to get a prosthesis.  Currently he is wheelchair-bound but is expected to be able to ambulate with no assistive devices with a prosthesis.  Levere is a K2 level ambulator that spends significant amount of time walking around inside and outside his home.  He will benefit from a K2 level prosthesis with a hydraulic ankle foot for improved stability.   PAIN:  Are you having pain? No  PRECAUTIONS: Fall  WEIGHT BEARING RESTRICTIONS: No  FALLS: Has patient fallen in last 6 months? No  LIVING ENVIRONMENT: Ask visit 2   PLOF: Independent  PATIENT GOALS: Walking better, mowing yard, walk around walking track, walking on uneven terrain, balancing on uneven terrain, using equipment at the [facility]   OBJECTIVE: (objective measures completed at initial evaluation unless otherwise dated)     COGNITION: Overall cognitive status: Within functional limits for tasks assessed   COORDINATION: WNL   POSTURE: posterior pelvic tilt and seated posture in personal WC  LOWER EXTREMITY ROM:      (Blank rows =  not tested)  LOWER EXTREMITY MMT:  test later date if indicated, did not want to use eval time to doff / don prosthetic for proper testing   MMT Right Eval Left Eval  Hip  flexion    Hip extension    Hip abduction    Hip adduction    Hip internal rotation    Hip external rotation    Knee flexion    Knee extension    Ankle dorsiflexion    Ankle plantarflexion    Ankle inversion    Ankle eversion    (Blank rows = not tested)  BED MOBILITY:  Pt report no problems with bed mobility at this time.   TRANSFERS: Assistive device utilized: Environmental consultant - 2 wheeled  Sit to stand: Modified independence Stand to sit: Modified independence Chair to chair: Modified independence Floor:  not assessed     STAIRS: Assess at later date if indicated   GAIT: Gait pattern: step through pattern, decreased step length- Left, decreased stance time- Right, decreased stride length, and circumduction- Right Distance walked: 30 ft Assistive device utilized: Environmental consultant - 2 wheeled used [employer] for more room for [employer].  Level of assistance: Modified independence Comments: no lob noted, see above for gait pattern  FUNCTIONAL TESTS:  5 times sit to stand: 21.68 with heavy UE  Timed up and go (TUG): 34.8 sec RW 10 meter walk test: 25.28 sec / .395 m/s  PATIENT SURVEYS:  FOTO 46  TODAY'S TREATMENT:                                                                                                                              DATE: EVAL Only     PATIENT EDUCATION: Education details: POC Person educated: Patient Education method: Explanation Education comprehension: verbalized understanding  HOME EXERCISE PROGRAM: Begin visit 2, single leg balance practice, step training, strength with prosthetic on   GOALS: Goals reviewed with patient? Yes  SHORT TERM GOALS: Target date: 05/23/2023     Patient will be independent in home exercise program to improve strength/mobility for better functional  independence with ADLs. Baseline: No HEP currently  Goal status: INITIAL   LONG TERM GOALS: Target date: 07/18/2023   1.  Patient (> 79 years old) will complete five times sit to stand test in < 15 seconds indicating an increased LE strength and improved balance. Baseline: 21.6 sec w/ heavy UE use Goal status: INITIAL  2.  Patient will increase FOTO score to equal to or greater than  53   to demonstrate statistically significant improvement in mobility and quality of life.  Baseline: 46 Goal status: INITIAL    3.   Patient will reduce timed up and go to <11 seconds to reduce fall risk and demonstrate improved transfer/gait ability. Baseline: 34.8 sec with Rw Goal status: INITIAL  4.   Patient will increase 10 meter walk test to >.35m/s as to improve gait speed for better community ambulation and to reduce fall risk. Baseline: .395 m/s with RW Goal status: INITIAL  5.  Pt will improve to 600 ft or greater to indicate improved community mobility and safety accessing environment independently.  Baseline:  unable to ambulate this distance at eval  Goal status: INITIAL  6. .  Patient will ambulate 30 feet or greater without assistive device and without loss of balance with his prosthesis in order to indicate improved functional mobility and less reliance on assistive device. Baseline: Ambulating with rolling walker currently Goal status: INITIAL          ASSESSMENT:  CLINICAL IMPRESSION: Patient is a 57 y.o. M who was seen today for physical therapy evaluation and treatment for strength and mobility training s/p R above knee amputation.  Patient presents with deficits in functional mobility as well as increased risk of falls based on current testing parameters.  Patient shows impaired lower extremity strength and power as evidenced by 5 times sit to stand test and heavy reliance on upper extremity throughout.  Patient also had demonstrates increased risk of falls based on  timed up and go test and 10 m walk test even with both of these being completed with an assistive device.  Patient will benefit from skilled physical therapy in order to improve crease his ambulatory capacity, improve his balance, and improve his ability to exercise and live a active lifestyle that will improve his long-term health and prognosis s/p trans femoral amputation.  OBJECTIVE IMPAIRMENTS: Abnormal gait, decreased activity tolerance, decreased balance, decreased endurance, decreased knowledge of use of DME, decreased mobility, difficulty walking, decreased strength, and hypomobility.   ACTIVITY LIMITATIONS: carrying, lifting, bending, standing, squatting, stairs, and locomotion level  PARTICIPATION LIMITATIONS: driving, shopping, community activity, yard work, and church  PERSONAL FACTORS: 1-2 comorbidities: DM, HTN, HLD   are also affecting patient's functional outcome.   REHAB POTENTIAL: Excellent  CLINICAL DECISION MAKING: Stable/uncomplicated  EVALUATION COMPLEXITY: Low  PLAN:  PT FREQUENCY: 1-2x/week  PT DURATION: 8 weeks  PLANNED INTERVENTIONS: Therapeutic exercises, Therapeutic activity, Neuromuscular re-education, Balance training, Gait training, Patient/Family education, Self Care, Joint mobilization, Stair training, Prosthetic training, and DME instructions  PLAN FOR NEXT SESSION: HEP, POC with balance and mobility focus. Limited visits due to medicaid, keep track throughout.    Norman Herrlich, PT 04/27/2023, 11:06 AM

## 2023-04-26 ENCOUNTER — Ambulatory Visit (INDEPENDENT_AMBULATORY_CARE_PROVIDER_SITE_OTHER): Payer: Medicaid Other

## 2023-04-26 ENCOUNTER — Ambulatory Visit (INDEPENDENT_AMBULATORY_CARE_PROVIDER_SITE_OTHER): Payer: Medicaid Other | Admitting: Vascular Surgery

## 2023-04-26 VITALS — BP 183/92 | HR 61 | Resp 17

## 2023-04-26 DIAGNOSIS — Z89611 Acquired absence of right leg above knee: Secondary | ICD-10-CM | POA: Diagnosis not present

## 2023-04-26 DIAGNOSIS — E782 Mixed hyperlipidemia: Secondary | ICD-10-CM

## 2023-04-26 DIAGNOSIS — E1165 Type 2 diabetes mellitus with hyperglycemia: Secondary | ICD-10-CM

## 2023-04-26 DIAGNOSIS — I10 Essential (primary) hypertension: Secondary | ICD-10-CM | POA: Diagnosis not present

## 2023-04-26 DIAGNOSIS — I739 Peripheral vascular disease, unspecified: Secondary | ICD-10-CM

## 2023-04-27 ENCOUNTER — Encounter: Payer: Self-pay | Admitting: Physical Therapy

## 2023-04-27 ENCOUNTER — Ambulatory Visit: Payer: Medicaid Other | Attending: Nurse Practitioner | Admitting: Physical Therapy

## 2023-04-27 DIAGNOSIS — Z89611 Acquired absence of right leg above knee: Secondary | ICD-10-CM | POA: Diagnosis not present

## 2023-04-27 DIAGNOSIS — R2689 Other abnormalities of gait and mobility: Secondary | ICD-10-CM | POA: Diagnosis not present

## 2023-04-27 DIAGNOSIS — M6281 Muscle weakness (generalized): Secondary | ICD-10-CM | POA: Diagnosis not present

## 2023-04-27 DIAGNOSIS — R269 Unspecified abnormalities of gait and mobility: Secondary | ICD-10-CM | POA: Diagnosis not present

## 2023-04-27 DIAGNOSIS — R262 Difficulty in walking, not elsewhere classified: Secondary | ICD-10-CM | POA: Diagnosis not present

## 2023-04-27 LAB — VAS US ABI WITH/WO TBI: Left ABI: 1.05

## 2023-04-30 ENCOUNTER — Ambulatory Visit: Payer: Medicaid Other

## 2023-04-30 DIAGNOSIS — R269 Unspecified abnormalities of gait and mobility: Secondary | ICD-10-CM | POA: Diagnosis not present

## 2023-04-30 DIAGNOSIS — M6281 Muscle weakness (generalized): Secondary | ICD-10-CM | POA: Diagnosis not present

## 2023-04-30 DIAGNOSIS — R262 Difficulty in walking, not elsewhere classified: Secondary | ICD-10-CM

## 2023-04-30 DIAGNOSIS — R2689 Other abnormalities of gait and mobility: Secondary | ICD-10-CM

## 2023-04-30 NOTE — Therapy (Deleted)
OUTPATIENT PHYSICAL THERAPY NEURO TREATMENT   Patient Name: [name] MRN: [id number] DOB:1966/10/23, 57 y.o., male Today's Date: 04/30/2023   PCP:   Zachary Cory, MD   REFERRING PROVIDER: Georgiana Spinner, NP   END OF SESSION:    Past Medical History:  Diagnosis Date   Diabetes mellitus without complication (HCC)    Hyperlipidemia    Hypertension    Past Surgical History:  Procedure Laterality Date   AMPUTATION Right 04/16/2021   Procedure: AMPUTATION  RIGHT 5TH RAY, INCISION AND DRAINAGE;  Surgeon: Felecia Shelling, DPM;  Location: [facility];  Service: Podiatry;  Laterality: Right;   AMPUTATION Right 11/24/2022   Procedure: AMPUTATION ABOVE KNEE;  Surgeon: Renford Dills, MD;  Location: [facility];  Service: Vascular;  Laterality: Right;   APPLICATION OF WOUND VAC Right 04/21/2021   Procedure: APPLICATION OF WOUND VAC;  Surgeon: Felecia Shelling, DPM;  Location: [facility];  Service: Podiatry;  Laterality: Right;   BONE BIOPSY Left 06/27/2020   Procedure: BONE BIOPSY;  Surgeon: Rosetta Posner, DPM;  Location: [facility];  Service: Podiatry;  Laterality: Left;   COLONOSCOPY WITH PROPOFOL N/A 06/22/2022   Procedure: COLONOSCOPY WITH PROPOFOL;  Surgeon: Wyline Mood, MD;  Location: [facility] ENDOSCOPY;  Service: Gastroenterology;  Laterality: N/A;   I & D EXTREMITY Right 11/22/2022   Procedure: IRRIGATION AND DEBRIDEMENT EXTREMITY, below knee amputation right leg;  Surgeon: Leafy Ro, MD;  Location: [facility];  Service: General;  Laterality: Right;   INCISION AND DRAINAGE Left 06/27/2020   Procedure: INCISION AND DRAINAGE;  Surgeon: Rosetta Posner, DPM;  Location: [facility];  Service: Podiatry;  Laterality: Left;   IRRIGATION AND DEBRIDEMENT FOOT Right 04/21/2021   Procedure: IRRIGATION AND DEBRIDEMENT FOOT;  Surgeon: Felecia Shelling, DPM;  Location: [facility];  Service: Podiatry;  Laterality: Right;   LOWER EXTREMITY ANGIOGRAPHY Left 06/30/2020   Procedure: Lower Extremity Angiography;   Surgeon: Annice Needy, MD;  Location: [facility];  Service: Cardiovascular;  Laterality: Left;   LOWER EXTREMITY ANGIOGRAPHY Right 04/14/2021   Procedure: Lower Extremity Angiography;  Surgeon: Annice Needy, MD;  Location: [facility];  Service: Cardiovascular;  Laterality: Right;   NO PAST SURGERIES     WOUND DEBRIDEMENT Right 04/19/2021   Procedure: DEBRIDEMENT WOUND;  Surgeon: Felecia Shelling, DPM;  Location: [facility];  Service: Podiatry;  Laterality: Right;   Patient Active Problem List   Diagnosis Date Noted   History of above knee amputation, right (HCC) 12/13/2022   Elevated troponin 11/21/2022   B12 deficiency 09/05/2022   Proliferative diabetic retinopathy of both eyes without macular edema associated with type 2 diabetes mellitus (HCC) 06/05/2022   PAD (peripheral artery disease) (HCC) 04/12/2021   Secondary renal hyperparathyroidism (HCC) 03/19/2019   Dyslipidemia associated with type 2 diabetes mellitus (HCC) 07/03/2018   Vitamin D deficiency 04/11/2018   ED (erectile dysfunction) 04/10/2018   Lower urinary tract symptoms (LUTS) 02/09/2017   Hypertension 07/05/2015   Hyperglycemia due to type 2 diabetes mellitus (HCC) 07/05/2015   HLD (hyperlipidemia) 07/05/2015    ONSET DATE: 11/25/23  REFERRING DIAG: N82.956 (ICD-10-CM) - History of above knee amputation, right (HCC)   THERAPY DIAG:  No diagnosis found.  Rationale for Evaluation and Treatment: Rehabilitation  SUBJECTIVE:  SUBJECTIVE STATEMENT: Pt has been walking with the walker at home. Pt got prosthesis about 1 month ago. He has about 3/4 of femur left. No falls since he had the surgery. Pt is comfortable with putting on prosthesis and taking it off. Prior to infection pt was working out at [employer] 5-6 days per  week. He used ot do treadmill, stationary bike and elliptical. Pt stopped in December immediately prior to the amputation. Pt has been doing HEP from home health and walking with walker in prosthetic.  Pt accompanied by: significant other  PERTINENT HISTORY: per referring note [name] is a right transfemoral amputee.  At this time he does not have any comorbidities that would impact his mobility or his ability to function with his prosthetic.  His limb is well-healed and ready to be fit with his first prosthesis.  He verbally communicates a strong desire to get a prosthesis.  Currently he is wheelchair-bound but is expected to be able to ambulate with no assistive devices with a prosthesis.  [name] is a K2 level ambulator that spends significant amount of time walking around inside and outside his home.  He will benefit from a K2 level prosthesis with a hydraulic ankle foot for improved stability.   PAIN:  Are you having pain? No  PRECAUTIONS: Fall  WEIGHT BEARING RESTRICTIONS: No  FALLS: Has patient fallen in last 6 months? No  LIVING ENVIRONMENT: Ask visit 2   PLOF: Independent  PATIENT GOALS: Walking better, mowing yard, walk around walking track, walking on uneven terrain, balancing on uneven terrain, using equipment at the [facility]   OBJECTIVE: (objective measures completed at initial evaluation unless otherwise dated)     COGNITION: Overall cognitive status: Within functional limits for tasks assessed   COORDINATION: WNL   POSTURE: posterior pelvic tilt and seated posture in personal WC  LOWER EXTREMITY ROM:      (Blank rows = not tested)  LOWER EXTREMITY MMT:  test later date if indicated, did not want to use eval time to doff / don prosthetic for proper testing   MMT Right Eval Left Eval  Hip flexion    Hip extension    Hip abduction    Hip adduction    Hip internal rotation    Hip external rotation    Knee flexion    Knee extension    Ankle dorsiflexion     Ankle plantarflexion    Ankle inversion    Ankle eversion    (Blank rows = not tested)  BED MOBILITY:  Pt report no problems with bed mobility at this time.   TRANSFERS: Assistive device utilized: Environmental consultant - 2 wheeled  Sit to stand: Modified independence Stand to sit: Modified independence Chair to chair: Modified independence Floor:  not assessed     STAIRS: Assess at later date if indicated   GAIT: Gait pattern: step through pattern, decreased step length- Left, decreased stance time- Right, decreased stride length, and circumduction- Right Distance walked: 30 ft Assistive device utilized: Environmental consultant - 2 wheeled used [employer] for more room for [employer].  Level of assistance: Modified independence Comments: no lob noted, see above for gait pattern  FUNCTIONAL TESTS:  5 times sit to stand: 21.68 with heavy UE  Timed up and go (TUG): 34.8 sec RW 10 meter walk test: 25.28 sec / .395 m/s  PATIENT SURVEYS:  FOTO 46  TODAY'S TREATMENT:  Access Code: [email or web address] URL: [email or web address] Date: 04/30/2023 Prepared by: Thresa Ross  Exercises - Standing Marching  - 1 x daily - 7 x weekly - 3 sets - 10 reps - Mini Squat with Counter Support  - 1 x daily - 7 x weekly - 3 sets - 10 reps - Single Leg Stance with Support  - 1 x daily - 7 x weekly - 3 sets - 30 sec hold - Single Leg Stance  - 1 x daily - 7 x weekly - 3 sets - 30 sec  hold   PATIENT EDUCATION: Education details: POC Person educated: Patient Education method: Explanation Education comprehension: verbalized understanding  HOME EXERCISE PROGRAM: Begin visit 2, single leg balance practice, step training, strength with prosthetic on   GOALS: Goals reviewed with patient? Yes  SHORT TERM GOALS: Target date: 05/23/2023     Patient will be independent in home exercise program  to improve strength/mobility for better functional independence with ADLs. Baseline: No HEP currently  Goal status: INITIAL   LONG TERM GOALS: Target date: 07/18/2023   1.  Patient (> 14 years old) will complete five times sit to stand test in < 15 seconds indicating an increased LE strength and improved balance. Baseline: 21.6 sec w/ heavy UE use Goal status: INITIAL  2.  Patient will increase FOTO score to equal to or greater than  53   to demonstrate statistically significant improvement in mobility and quality of life.  Baseline: 46 Goal status: INITIAL    3.   Patient will reduce timed up and go to <11 seconds to reduce fall risk and demonstrate improved transfer/gait ability. Baseline: 34.8 sec with Rw Goal status: INITIAL  4.   Patient will increase 10 meter walk test to >.80m/s as to improve gait speed for better community ambulation and to reduce fall risk. Baseline: .395 m/s with RW Goal status: INITIAL  5.  Pt will improve to 600 ft or greater to indicate improved community mobility and safety accessing environment independently.  Baseline: unable to ambulate this distance at eval  Goal status: INITIAL  6. .  Patient will ambulate 30 feet or greater without assistive device and without loss of balance with his prosthesis in order to indicate improved functional mobility and less reliance on assistive device. Baseline: Ambulating with rolling walker currently Goal status: INITIAL          ASSESSMENT:    CLINICAL IMPRESSION: Patient is a 57 y.o. M who was seen today for physical therapy evaluation and treatment for strength and mobility training s/p R above knee amputation.  Patient presents with deficits in functional mobility as well as increased risk of falls based on current testing parameters.  Patient shows impaired lower extremity strength and power as evidenced by 5 times sit to stand test and heavy reliance on upper extremity throughout.  Patient  also had demonstrates increased risk of falls based on timed up and go test and 10 m walk test even with both of these being completed with an assistive device.  Patient will benefit from skilled physical therapy in order to improve crease his ambulatory capacity, improve his balance, and improve his ability to exercise and live a active lifestyle that will improve his long-term health and prognosis s/p trans femoral amputation.  OBJECTIVE IMPAIRMENTS: Abnormal gait, decreased activity tolerance, decreased balance, decreased endurance, decreased knowledge of use of DME, decreased mobility, difficulty walking, decreased strength, and hypomobility.   ACTIVITY LIMITATIONS: carrying, lifting, bending, standing, squatting, stairs,  and locomotion level  PARTICIPATION LIMITATIONS: driving, shopping, community activity, yard work, and church  PERSONAL FACTORS: 1-2 comorbidities: DM, HTN, HLD   are also affecting patient's functional outcome.   REHAB POTENTIAL: Excellent  CLINICAL DECISION MAKING: Stable/uncomplicated  EVALUATION COMPLEXITY: Low  PLAN:  PT FREQUENCY: 1-2x/week  PT DURATION: 8 weeks  PLANNED INTERVENTIONS: Therapeutic exercises, Therapeutic activity, Neuromuscular re-education, Balance training, Gait training, Patient/Family education, Self Care, Joint mobilization, Stair training, Prosthetic training, and DME instructions  PLAN FOR NEXT SESSION: HEP, POC with balance and mobility focus. Limited visits due to medicaid, keep track throughout.    Norman Herrlich, PT 04/30/2023, 7:58 AM

## 2023-04-30 NOTE — Therapy (Signed)
OUTPATIENT PHYSICAL THERAPY NEURO TREATMENT   Patient Name: [name] MRN: [id number] DOB:1966-01-10, 57 y.o., male Today's Date: 04/30/2023   PCP:   Alba Cory, MD   REFERRING PROVIDER: Georgiana Spinner, NP   END OF SESSION:  PT End of Session - 04/30/23 1148     Visit Number 2    Number of Visits 16    Date for PT Re-Evaluation 06/22/23    PT Start Time 1148    Equipment Utilized During Treatment Gait belt    Activity Tolerance Patient tolerated treatment well    Behavior During Therapy [facility] for tasks assessed/performed             Past Medical History:  Diagnosis Date   Diabetes mellitus without complication (HCC)    Hyperlipidemia    Hypertension    Past Surgical History:  Procedure Laterality Date   AMPUTATION Right 04/16/2021   Procedure: AMPUTATION  RIGHT 5TH RAY, INCISION AND DRAINAGE;  Surgeon: Felecia Shelling, DPM;  Location: [facility] ORS;  Service: Podiatry;  Laterality: Right;   AMPUTATION Right 11/24/2022   Procedure: AMPUTATION ABOVE KNEE;  Surgeon: Renford Dills, MD;  Location: [facility] ORS;  Service: Vascular;  Laterality: Right;   APPLICATION OF WOUND VAC Right 04/21/2021   Procedure: APPLICATION OF WOUND VAC;  Surgeon: Felecia Shelling, DPM;  Location: [facility] ORS;  Service: Podiatry;  Laterality: Right;   BONE BIOPSY Left 06/27/2020   Procedure: BONE BIOPSY;  Surgeon: Rosetta Posner, DPM;  Location: [facility] ORS;  Service: Podiatry;  Laterality: Left;   COLONOSCOPY WITH PROPOFOL N/A 06/22/2022   Procedure: COLONOSCOPY WITH PROPOFOL;  Surgeon: Wyline Mood, MD;  Location: [facility] ENDOSCOPY;  Service: Gastroenterology;  Laterality: N/A;   I & D EXTREMITY Right 11/22/2022   Procedure: IRRIGATION AND DEBRIDEMENT EXTREMITY, below knee amputation right leg;  Surgeon: Leafy Ro, MD;  Location: [facility] ORS;  Service: General;  Laterality: Right;   INCISION AND DRAINAGE Left 06/27/2020   Procedure: INCISION AND DRAINAGE;  Surgeon: Rosetta Posner, DPM;  Location: [facility]  ORS;  Service: Podiatry;  Laterality: Left;   IRRIGATION AND DEBRIDEMENT FOOT Right 04/21/2021   Procedure: IRRIGATION AND DEBRIDEMENT FOOT;  Surgeon: Felecia Shelling, DPM;  Location: [facility] ORS;  Service: Podiatry;  Laterality: Right;   LOWER EXTREMITY ANGIOGRAPHY Left 06/30/2020   Procedure: Lower Extremity Angiography;  Surgeon: Annice Needy, MD;  Location: [facility] INVASIVE CV LAB;  Service: Cardiovascular;  Laterality: Left;   LOWER EXTREMITY ANGIOGRAPHY Right 04/14/2021   Procedure: Lower Extremity Angiography;  Surgeon: Annice Needy, MD;  Location: [facility] INVAS[facility] CV LAB;  Service: Cardiovascular;  Laterality: Right;   NO PAST SURGERIES     WOUND DEBRIDEMENT Right 04/19/2021   Procedure: DEBRIDEMENT WOUND;  Surgeon: Felecia Shelling, DPM;  Location: [facility] ORS;  Service: Podiatry;  Laterality: Right;   Patient Active Problem List   Diagnosis Date Noted   History of above knee amputation, right (HCC) 12/13/2022   Elevated troponin 11/21/2022   B12 deficiency 09/05/2022   Proliferative diabetic retinopathy of both eyes without macular edema associated with type 2 diabetes mellitus (HCC) 06/05/2022   PAD (peripheral artery disease) (HCC) 04/12/2021   Secondary renal hyperparathyroidism (HCC) 03/19/2019   Dyslipidemia associated with type 2 diabetes mellitus (HCC) 07/03/2018   Vitamin D deficiency 04/11/2018   ED (erectile dysfunction) 04/10/2018   Lower urinary tract symptoms (LUTS) 02/09/2017   Hypertension 07/05/2015   Hyperglycemia due to type 2 diabetes mellitus (HCC) 07/05/2015   HLD (  hyperlipidemia) 07/05/2015    ONSET DATE: 11/25/23  REFERRING DIAG: Z61.096 (ICD-10-CM) - History of above knee amputation, right (HCC)   THERAPY DIAG:  Abnormality of gait and mobility  Difficulty in walking, not elsewhere classified  Muscle weakness (generalized)  Other abnormalities of gait and mobility  Rationale for Evaluation and Treatment: Rehabilitation  SUBJECTIVE:                                                                                                                                                                                              SUBJECTIVE STATEMENT:  Patient reports no new issues since initial evaluation. He reports pleased to be able to progress to prosthetic training.      Pt accompanied by: significant other  PERTINENT HISTORY: per referring note [name] is a right transfemoral amputee.  At this time he does not have any comorbidities that would impact his mobility or his ability to function with his prosthetic.  His limb is well-healed and ready to be fit with his first prosthesis.  He verbally communicates a strong desire to get a prosthesis.  Currently he is wheelchair-bound but is expected to be able to ambulate with no assistive devices with a prosthesis.  [name] is a K2 level ambulator that spends significant amount of time walking around inside and outside his home.  He will benefit from a K2 level prosthesis with a hydraulic ankle foot for improved stability.   PAIN:  Are you having pain? No  PRECAUTIONS: Fall  WEIGHT BEARING RESTRICTIONS: No  FALLS: Has patient fallen in last 6 months? No  LIVING ENVIRONMENT: Ask visit 2   PLOF: Independent  PATIENT GOALS: Walking better, mowing yard, walk around walking track, walking on uneven terrain, balancing on uneven terrain, using equipment at the [facility]   OBJECTIVE: (objective measures completed at initial evaluation unless otherwise dated)     COGNITION: Overall cognitive status: Within functional limits for tasks assessed   COORDINATION: WNL   POSTURE: posterior pelvic tilt and seated posture in personal WC  LOWER EXTREMITY ROM:      (Blank rows = not tested)  LOWER EXTREMITY MMT:  test later date if indicated, did not want to use eval time to doff / don prosthetic for proper testing   MMT Right Eval Left Eval  Hip flexion    Hip extension    Hip abduction    Hip adduction     Hip internal rotation    Hip external rotation    Knee flexion    Knee extension    Ankle dorsiflexion    Ankle plantarflexion  Ankle inversion    Ankle eversion    (Blank rows = not tested)  BED MOBILITY:  Pt report no problems with bed mobility at this time.   TRANSFERS: Assistive device utilized: Environmental consultant - 2 wheeled  Sit to stand: Modified independence Stand to sit: Modified independence Chair to chair: Modified independence Floor:  not assessed     STAIRS: Assess at later date if indicated   GAIT: Gait pattern: step through pattern, decreased step length- Left, decreased stance time- Right, decreased stride length, and circumduction- Right Distance walked: 30 ft Assistive device utilized: Environmental consultant - 2 wheeled used [employer] for more room for [employer].  Level of assistance: Modified independence Comments: no lob noted, see above for gait pattern  FUNCTIONAL TESTS:  5 times sit to stand: 21.68 with heavy UE  Timed up and go (TUG): 34.8 sec RW 10 meter walk test: 25.28 sec / .395 m/s  PATIENT SURVEYS:  FOTO 46  TODAY'S TREATMENT:                                                                                                                              DATE:   THEREX:   Instructed in below HEP-  Standing march with minimal UE Support at support bar, CGA with Right prosthesis donned 2 sets of 10 reps each LE  Dynamic lateral weight shifting left to right - attempting to not let Right prosthesis side buckle- x 15 reps.   Single leg stance on left LE without UE support (attempted many trials- at best 3-4 sec prior to reaching for UE support)   Single leg stance on right UE  with min B UE support (attempted mult trials - 15+)   Standing minisquats- x 10 reps x 2 - VC for weight shift to off load right Prosthesis.   Supine- Bridging - 2 sets of 10 reps  Supine heel slides (PT assist with right Prosthesis) x 15 reps.  Sidelye hip abd - 10 reps right LE (with  prosthesis)  Sidelye hip ER (clamshell) - 10 reps (with prosthesis)        PATIENT EDUCATION: Education details: POC Person educated: Patient Education method: Explanation Education comprehension: verbalized understanding  HOME EXERCISE PROGRAM: Access Code: [email or web address] URL: [email or web address] Date: 04/30/2023 Prepared by: Thresa Ross  Exercises - Standing Marching  - 1 x daily - 7 x weekly - 3 sets - 10 reps - Mini Squat with Counter Support  - 1 x daily - 7 x weekly - 3 sets - 10 reps - Single Leg Stance with Support  - 1 x daily - 7 x weekly - 3 sets - 30 sec hold - Single Leg Stance  - 1 x daily - 7 x weekly - 3 sets - 30 sec  hold  GOALS: Goals reviewed with patient? Yes  SHORT TERM GOALS: Target date: 05/23/2023     Patient will be independent in home exercise program to improve strength/mobility for  better functional independence with ADLs. Baseline: No HEP currently  Goal status: INITIAL   LONG TERM GOALS: Target date: 07/18/2023   1.  Patient (> 25 years old) will complete five times sit to stand test in < 15 seconds indicating an increased LE strength and improved balance. Baseline: 21.6 sec w/ heavy UE use Goal status: INITIAL  2.  Patient will increase FOTO score to equal to or greater than  53   to demonstrate statistically significant improvement in mobility and quality of life.  Baseline: 46 Goal status: INITIAL    3.   Patient will reduce timed up and go to <11 seconds to reduce fall risk and demonstrate improved transfer/gait ability. Baseline: 34.8 sec with Rw Goal status: INITIAL  4.   Patient will increase 10 meter walk test to >.48m/s as to improve gait speed for better community ambulation and to reduce fall risk. Baseline: .395 m/s with RW Goal status: INITIAL  5.  Pt will improve to 600 ft or greater to indicate improved community mobility and safety accessing environment independently.  Baseline: unable to  ambulate this distance at eval  Goal status: INITIAL  6. .  Patient will ambulate 30 feet or greater without assistive device and without loss of balance with his prosthesis in order to indicate improved functional mobility and less reliance on assistive device. Baseline: Ambulating with rolling walker currently Goal status: INITIAL          ASSESSMENT:  CLINICAL IMPRESSION: Patient returns to clinic for his 2nd visit with excellent motivation. He was responsive to all instruction- able to educate in pre-planned HEP and execute with good understanding and ability to perform.  He presents with decrease confidence with right LE weightbearing and and overall gluteal weakness which will be a point of emphasis in next few sessions. Patient will continue benefit from skilled physical therapy in order to improve crease his ambulatory capacity, improve his balance, and improve his ability to exercise and live a active lifestyle that will improve his long-term health and prognosis s/p trans femoral amputation.  OBJECTIVE IMPAIRMENTS: Abnormal gait, decreased activity tolerance, decreased balance, decreased endurance, decreased knowledge of use of DME, decreased mobility, difficulty walking, decreased strength, and hypomobility.   ACTIVITY LIMITATIONS: carrying, lifting, bending, standing, squatting, stairs, and locomotion level  PARTICIPATION LIMITATIONS: driving, shopping, community activity, yard work, and church  PERSONAL FACTORS: 1-2 comorbidities: DM, HTN, HLD   are also affecting patient's functional outcome.   REHAB POTENTIAL: Excellent  CLINICAL DECISION MAKING: Stable/uncomplicated  EVALUATION COMPLEXITY: Low  PLAN:  PT FREQUENCY: 1-2x/week  PT DURATION: 8 weeks  PLANNED INTERVENTIONS: Therapeutic exercises, Therapeutic activity, Neuromuscular re-education, Balance training, Gait training, Patient/Family education, Self Care, Joint mobilization, Stair training, Prosthetic  training, and DME instructions  PLAN FOR NEXT SESSION: progress HEP, POC with balance and mobility focus. Limited visits due to medicaid, keep track throughout.    Lenda Kelp, PT 04/30/2023, 11:48 AM

## 2023-05-01 NOTE — Therapy (Signed)
OUTPATIENT PHYSICAL THERAPY NEURO TREATMENT   Patient Name: [name] MRN: [id number] DOB:September 26, 1966, 57 y.o., male Today's Date: 05/02/2023   PCP:   Alba Cory, MD   REFERRING PROVIDER: Georgiana Spinner, NP   END OF SESSION:  PT End of Session - 05/02/23 1103     Visit Number 3    Number of Visits 16    Date for PT Re-Evaluation 06/22/23    PT Start Time 1102    PT Stop Time 1145    PT Time Calculation (min) 43 min    Equipment Utilized During Treatment Gait belt    Activity Tolerance Patient tolerated treatment well    Behavior During Therapy WFL for tasks assessed/performed              Past Medical History:  Diagnosis Date   Diabetes mellitus without complication (HCC)    Hyperlipidemia    Hypertension    Past Surgical History:  Procedure Laterality Date   AMPUTATION Right 04/16/2021   Procedure: AMPUTATION  RIGHT 5TH RAY, INCISION AND DRAINAGE;  Surgeon: Felecia Shelling, DPM;  Location: [facility] ORS;  Service: Podiatry;  Laterality: Right;   AMPUTATION Right 11/24/2022   Procedure: AMPUTATION ABOVE KNEE;  Surgeon: Renford Dills, MD;  Location: [facility] ORS;  Service: Vascular;  Laterality: Right;   APPLICATION OF WOUND VAC Right 04/21/2021   Procedure: APPLICATION OF WOUND VAC;  Surgeon: Felecia Shelling, DPM;  Location: [facility] OR[facility];  Service: Podiatry;  Laterality: Right;   BONE BIOPSY Left 06/27/2020   Procedure: BONE BIOPSY;  Surgeon: Rosetta Posner, DPM;  Location: [facility] OR[facility];  Service: Podiatry;  Laterality: Left;   COLONOSCOPY WITH PROPOFOL N/A 06/22/2022   Procedure: COLONOSCOPY WITH PROPOFOL;  Surgeon: Wyline Mood, MD;  Location: [facility] ENDOSCOPY;  Service: Gastroenterology;  Laterality: N/A;   I & D EXTREMITY Right 11/22/2022   Procedure: IRRIGATION AND DEBRIDEMENT EXTREMITY, below knee amputation right leg;  Surgeon: Leafy Ro, MD;  Location: [facility] ORS;  Service: General;  Laterality: Right;   INCISION AND DRAINAGE Left 06/27/2020   Procedure: INCISION  AND DRAINAGE;  Surgeon: Rosetta Posner, DPM;  Location: [facility] OR[facility];  Service: Podiatry;  Laterality: Left;   IRRIGATION AND DEBRIDEMENT FOOT Right 04/21/2021   Procedure: IRRIGATION AND DEBRIDEMENT FOOT;  Surgeon: Felecia Shelling, DPM;  Location: [facility] ORS;  Service: Podiatry;  Laterality: Right;   LOWER EXTREMITY ANGIOGRAPHY Left 06/30/2020   Procedure: Lower Extremity Angiography;  Surgeon: Annice Needy, MD;  Location: [facility] INVASIVE CV LAB;  Service: Cardiovascular;  Laterality: Left;   LOWER EXTREMITY ANGIOGRAPHY Right 04/14/2021   Procedure: Lower Extremity Angiography;  Surgeon: Annice Needy, MD;  Location: [facility] INVASIVE CV LAB;  Service: Cardiovascular;  Laterality: Right;   NO PAST SURGERIES     WOUND DEBRIDEMENT Right 04/19/2021   Procedure: DEBRIDEMENT WOUND;  Surgeon: Felecia Shelling, DPM;  Location: [facility] ORS;  Service: Podiatry;  Laterality: Right;   Patient Active Problem List   Diagnosis Date Noted   History of above knee amputation, right (HCC) 12/13/2022   Elevated troponin 11/21/2022   B12 deficiency 09/05/2022   Proliferative diabetic retinopathy of both eyes without macular edema associated with type 2 diabetes mellitus (HCC) 06/05/2022   PAD (peripheral artery disease) (HCC) 04/12/2021   Secondary renal hyperparathyroidism (HCC) 03/19/2019   Dyslipidemia associated with type 2 diabetes mellitus (HCC) 07/03/2018   Vitamin D deficiency 04/11/2018   ED (erectile dysfunction) 04/10/2018   Lower urinary tract symptoms (LUTS) 02/09/2017  Hypertension 07/05/2015   Hyperglycemia due to type 2 diabetes mellitus (HCC) 07/05/2015   HLD (hyperlipidemia) 07/05/2015    ONSET DATE: 11/25/23  REFERRING DIAG: Z61.096 (ICD-10-CM) - History of above knee amputation, right (HCC)   THERAPY DIAG:  Abnormality of gait and mobility  Difficulty in walking, not elsewhere classified  Muscle weakness (generalized)  Other abnormalities of gait and mobility  Rationale for Evaluation and  Treatment: Rehabilitation  SUBJECTIVE:                                                                                                                                                                                             SUBJECTIVE STATEMENT: Patient reports it is his anniversary today. Has been compliant with HEP.     Pt accompanied by: significant other  PERTINENT HISTORY: per referring note Nader Ippolito is a right transfemoral amputee.  At this time he does not have any comorbidities that would impact his mobility or his ability to function with his prosthetic.  His limb is well-healed and ready to be fit with his first prosthesis.  He verbally communicates a strong desire to get a prosthesis.  Currently he is wheelchair-bound but is expected to be able to ambulate with no assistive devices with a prosthesis.  Layke is a K2 level ambulator that spends significant amount of time walking around inside and outside his home.  He will benefit from a K2 level prosthesis with a hydraulic ankle foot for improved stability.   PAIN:  Are you having pain? No  PRECAUTIONS: Fall  WEIGHT BEARING RESTRICTIONS: No  FALLS: Has patient fallen in last 6 months? No  LIVING ENVIRONMENT: Ask visit 2   PLOF: Independent  PATIENT GOALS: Walking better, mowing yard, walk around walking track, walking on uneven terrain, balancing on uneven terrain, using equipment at the [facility]   OBJECTIVE: (objective measures completed at initial evaluation unless otherwise dated)     COGNITION: Overall cognitive status: Within functional limits for tasks assessed   COORDINATION: WNL   POSTURE: posterior pelvic tilt and seated posture in personal WC  LOWER EXTREMITY ROM:      (Blank rows = not tested)  LOWER EXTREMITY MMT:  test later date if indicated, did not want to use eval time to doff / don prosthetic for proper testing   MMT Right Eval Left Eval  Hip flexion    Hip extension    Hip abduction     Hip adduction    Hip internal rotation    Hip external rotation    Knee flexion    Knee extension    Ankle dorsiflexion  Ankle plantarflexion    Ankle inversion    Ankle eversion    (Blank rows = not tested)  BED MOBILITY:  Pt report no problems with bed mobility at this time.   TRANSFERS: Assistive device utilized: Environmental consultant - 2 wheeled  Sit to stand: Modified independence Stand to sit: Modified independence Chair to chair: Modified independence Floor:  not assessed     STAIRS: Assess at later date if indicated   GAIT: Gait pattern: step through pattern, decreased step length- Left, decreased stance time- Right, decreased stride length, and circumduction- Right Distance walked: 30 ft Assistive device utilized: Environmental consultant - 2 wheeled used [employer] for more room for [employer].  Level of assistance: Modified independence Comments: no lob noted, see above for gait pattern  FUNCTIONAL TESTS:  5 times sit to stand: 21.68 with heavy UE  Timed up and go (TUG): 34.8 sec RW 10 meter walk test: 25.28 sec / .395 m/s  PATIENT SURVEYS:  FOTO 46  TODAY'S TREATMENT:                                                                                                                              DATE:   THEREX:   Standing interventions with R prosthetic donned:   Dynamic lateral weight shifting left to right - attempting to not let Right prosthesis side buckle- x 15 reps.   Modified single limb stance: LLE on dynadisc 30 seconds, progressed To leg on soccer ball60 seconds x 3 trials; no UE support for intermittent periods  Speed ladder: one foot each square cue for upright posture and decreasing UE support x8 length of // bars  Lateral step 4x length of // bars cues for weight shift onto prosthetic limb.   Neuro RE-ed:   Static stand: cross body punches 60 seconds x 2 trials for cross body coordination without UE support for pivoting.   Activity Description: three on floor, three  on table in // bars, green =left hand/foot, red=right hand/foot  Activity Setting:  The [employer] Random setting was chosen to enhance cognitive processing and agility, providing an unpredictable environment to simulate real-world scenarios, and fostering quick reactions and adaptability.   Number of Pods:  6 Cycles/Sets:  3 Duration (Time or Hit Count):  30 seconds  Patient Stats   Hits:   7,14, 20      PATIENT EDUCATION: Education details: POC Person educated: Patient Education method: Explanation Education comprehension: verbalized understanding  HOME EXERCISE PROGRAM: Access Code: [email or web address] URL: [email or web address] Date: 04/30/2023 Prepared by: Thresa Ross  Exercises - Standing Marching  - 1 x daily - 7 x weekly - 3 sets - 10 reps - Mini Squat with Counter Support  - 1 x daily - 7 x weekly - 3 sets - 10 reps - Single Leg Stance with Support  - 1 x daily - 7 x weekly - 3 sets - 30 sec hold - Single Leg Stance  - 1 x  daily - 7 x weekly - 3 sets - 30 sec  hold  GOALS: Goals reviewed with patient? Yes  SHORT TERM GOALS: Target date: 05/23/2023     Patient will be independent in home exercise program to improve strength/mobility for better functional independence with ADLs. Baseline: No HEP currently  Goal status: INITIAL   LONG TERM GOALS: Target date: 07/18/2023   1.  Patient (> 2 years old) will complete five times sit to stand test in < 15 seconds indicating an increased LE strength and improved balance. Baseline: 21.6 sec w/ heavy UE use Goal status: INITIAL  2.  Patient will increase FOTO score to equal to or greater than  53   to demonstrate statistically significant improvement in mobility and quality of life.  Baseline: 46 Goal status: INITIAL    3.   Patient will reduce timed up and go to <11 seconds to reduce fall risk and demonstrate improved transfer/gait ability. Baseline: 34.8 sec with Rw Goal status: INITIAL  4.   Patient  will increase 10 meter walk test to >.46m/s as to improve gait speed for better community ambulation and to reduce fall risk. Baseline: .395 m/s with RW Goal status: INITIAL  5.  Pt will improve to 600 ft or greater to indicate improved community mobility and safety accessing environment independently.  Baseline: unable to ambulate this distance at eval  Goal status: INITIAL  6. .  Patient will ambulate 30 feet or greater without assistive device and without loss of balance with his prosthesis in order to indicate improved functional mobility and less reliance on assistive device. Baseline: Ambulating with rolling walker currently Goal status: INITIAL          ASSESSMENT:  CLINICAL IMPRESSION: Patient is highly motivated throughout session. Patient requires external cues for weight shift and upright position. His gait improves with repetition on speed ladder for visual feedback of step length. As he continues to ambulate he does have increased trunk flexion.   Patient will continue benefit from skilled physical therapy in order to improve crease his ambulatory capacity, improve his balance, and improve his ability to exercise and live a active lifestyle that will improve his long-term health and prognosis s/p trans femoral amputation.  OBJECTIVE IMPAIRMENTS: Abnormal gait, decreased activity tolerance, decreased balance, decreased endurance, decreased knowledge of use of DME, decreased mobility, difficulty walking, decreased strength, and hypomobility.   ACTIVITY LIMITATIONS: carrying, lifting, bending, standing, squatting, stairs, and locomotion level  PARTICIPATION LIMITATIONS: driving, shopping, community activity, yard work, and church  PERSONAL FACTORS: 1-2 comorbidities: DM, HTN, HLD   are also affecting patient's functional outcome.   REHAB POTENTIAL: Excellent  CLINICAL DECISION MAKING: Stable/uncomplicated  EVALUATION COMPLEXITY: Low  PLAN:  PT FREQUENCY:  1-2x/week  PT DURATION: 8 weeks  PLANNED INTERVENTIONS: Therapeutic exercises, Therapeutic activity, Neuromuscular re-education, Balance training, Gait training, Patient/Family education, Self Care, Joint mobilization, Stair training, Prosthetic training, and DME instructions  PLAN FOR NEXT SESSION: progress HEP, POC with balance and mobility focus. Limited visits due to medicaid, keep track throughout.    Precious Bard, PT 05/02/2023, 12:50 PM

## 2023-05-02 ENCOUNTER — Ambulatory Visit: Payer: Medicaid Other

## 2023-05-02 DIAGNOSIS — R2689 Other abnormalities of gait and mobility: Secondary | ICD-10-CM | POA: Diagnosis not present

## 2023-05-02 DIAGNOSIS — R269 Unspecified abnormalities of gait and mobility: Secondary | ICD-10-CM | POA: Diagnosis not present

## 2023-05-02 DIAGNOSIS — M6281 Muscle weakness (generalized): Secondary | ICD-10-CM

## 2023-05-02 DIAGNOSIS — R262 Difficulty in walking, not elsewhere classified: Secondary | ICD-10-CM | POA: Diagnosis not present

## 2023-05-09 NOTE — Therapy (Signed)
OUTPATIENT PHYSICAL THERAPY NEURO TREATMENT   Patient Name: [name] MRN: [id number] DOB:09-28-1966, 57 y.o., male Today's Date: 05/10/2023   PCP:   Alba Cory, MD   REFERRING PROVIDER: Georgiana Spinner, NP   END OF SESSION:  PT End of Session - 05/10/23 0934     Visit Number 4    Number of Visits 16    Date for PT Re-Evaluation 06/22/23    PT Start Time 0934    PT Stop Time 1016    PT Time Calculation (min) 42 min    Equipment Utilized During Treatment Gait belt    Activity Tolerance Patient tolerated treatment well    Behavior During Therapy WFL for tasks assessed/performed               Past Medical History:  Diagnosis Date   Diabetes mellitus without complication (HCC)    Hyperlipidemia    Hypertension    Past Surgical History:  Procedure Laterality Date   AMPUTATION Right 04/16/2021   Procedure: AMPUTATION  RIGHT 5TH RAY, INCISION AND DRAINAGE;  Surgeon: Felecia Shelling, DPM;  Location: [facility];  Service: Podiatry;  Laterality: Right;   AMPUTATION Right 11/24/2022   Procedure: AMPUTATION ABOVE KNEE;  Surgeon: Renford Dills, MD;  Location: [facility];  Service: Vascular;  Laterality: Right;   APPLICATION OF WOUND VAC Right 04/21/2021   Procedure: APPLICATION OF WOUND VAC;  Surgeon: Felecia Shelling, DPM;  Location: [facility];  Service: Podiatry;  Laterality: Right;   BONE BIOPSY Left 06/27/2020   Procedure: BONE BIOPSY;  Surgeon: Rosetta Posner, DPM;  Location: [facility];  Service: Podiatry;  Laterality: Left;   COLONOSCOPY WITH PROPOFOL N/A 06/22/2022   Procedure: COLONOSCOPY WITH PROPOFOL;  Surgeon: Wyline Mood, MD;  Location: [facility] ENDOSCOPY;  Service: Gastroenterology;  Laterality: N/A;   I & D EXTREMITY Right 11/22/2022   Procedure: IRRIGATION AND DEBRIDEMENT EXTREMITY, below knee amputation right leg;  Surgeon: Leafy Ro, MD;  Location: [facility];  Service: General;  Laterality: Right;   INCISION AND DRAINAGE Left 06/27/2020   Procedure: INCISION  AND DRAINAGE;  Surgeon: Rosetta Posner, DPM;  Location: [facility];  Service: Podiatry;  Laterality: Left;   IRRIGATION AND DEBRIDEMENT FOOT Right 04/21/2021   Procedure: IRRIGATION AND DEBRIDEMENT FOOT;  Surgeon: Felecia Shelling, DPM;  Location: [facility];  Service: Podiatry;  Laterality: Right;   LOWER EXTREMITY ANGIOGRAPHY Left 06/30/2020   Procedure: Lower Extremity Angiography;  Surgeon: Annice Needy, MD;  Location: [facility] IN[facility]ASIVE CV [facility];  Service: Cardiovascular;  Laterality: Left;   LOWER EXTREMITY ANGIOGRAPHY Right 04/14/2021   Procedure: Lower Extremity Angiography;  Surgeon: Annice Needy, MD;  Location: [facility];  Service: Cardiovascular;  Laterality: Right;   NO PAST SURGERIES     WOUND DEBRIDEMENT Right 04/19/2021   Procedure: DEBRIDEMENT WOUND;  Surgeon: Felecia Shelling, DPM;  Location: [facility];  Service: Podiatry;  Laterality: Right;   Patient Active Problem List   Diagnosis Date Noted   History of above knee amputation, right (HCC) 12/13/2022   Elevated troponin 11/21/2022   B12 deficiency 09/05/2022   Proliferative diabetic retinopathy of both eyes without macular edema associated with type 2 diabetes mellitus (HCC) 06/05/2022   PAD (peripheral artery disease) (HCC) 04/12/2021   Secondary renal hyperparathyroidism (HCC) 03/19/2019   Dyslipidemia associated with type 2 diabetes mellitus (HCC) 07/03/2018   Vitamin D deficiency 04/11/2018   ED (erectile dysfunction) 04/10/2018   Lower urinary tract symptoms (LUTS) 02/09/2017  Hypertension 07/05/2015   Hyperglycemia due to type 2 diabetes mellitus (HCC) 07/05/2015   HLD (hyperlipidemia) 07/05/2015    ONSET DATE: 11/25/23  REFERRING DIAG: Z61.096 (ICD-10-CM) - History of above knee amputation, right (HCC)   THERAPY DIAG:  Abnormality of gait and mobility  Difficulty in walking, not elsewhere classified  Muscle weakness (generalized)  Other abnormalities of gait and mobility  Rationale for Evaluation and  Treatment: Rehabilitation  SUBJECTIVE:                                                                                                                                                                                             SUBJECTIVE STATEMENT: Patient reports skin is looking good, no falls or LOB.     Pt accompanied by: significant other  PERTINENT HISTORY: per referring note [name] is a right transfemoral amputee.  At this time he does not have any comorbidities that would impact his mobility or his ability to function with his prosthetic.  His limb is well-healed and ready to be fit with his first prosthesis.  He verbally communicates a strong desire to get a prosthesis.  Currently he is wheelchair-bound but is expected to be able to ambulate with no assistive devices with a prosthesis.  [place] is a K2 level ambulator that spends significant amount of time walking around inside and outside his home.  He will benefit from a K2 level prosthesis with a hydraulic ankle foot for improved stability.   PAIN:  Are you having pain? No  PRECAUTIONS: Fall  WEIGHT BEARING RESTRICTIONS: No  FALLS: Has patient fallen in last 6 months? No  LIVING ENVIRONMENT: Ask visit 2   PLOF: Independent  PATIENT GOALS: Walking better, mowing yard, walk around walking track, walking on uneven terrain, balancing on uneven terrain, using equipment at the [facility]   OBJECTIVE: (objective measures completed at initial evaluation unless otherwise dated)     COGNITION: Overall cognitive status: Within functional limits for tasks assessed   COORDINATION: WNL   POSTURE: posterior pelvic tilt and seated posture in personal WC  LOWER EXTREMITY ROM:      (Blank rows = not tested)  LOWER EXTREMITY MMT:  test later date if indicated, did not want to use eval time to doff / don prosthetic for proper testing   MMT Right Eval Left Eval  Hip flexion    Hip extension    Hip abduction    Hip adduction     Hip internal rotation    Hip external rotation    Knee flexion    Knee extension    Ankle dorsiflexion  Ankle plantarflexion    Ankle inversion    Ankle eversion    (Blank rows = not tested)  BED MOBILITY:  Pt report no problems with bed mobility at this time.   TRANSFERS: Assistive device utilized: Environmental consultant - 2 wheeled  Sit to stand: Modified independence Stand to sit: Modified independence Chair to chair: Modified independence Floor:  not assessed     STAIRS: Assess at later date if indicated   GAIT: Gait pattern: step through pattern, decreased step length- Left, decreased stance time- Right, decreased stride length, and circumduction- Right Distance walked: 30 ft Assistive device utilized: Environmental consultant - 2 wheeled used [employer] for more room for [employer].  Level of assistance: Modified independence Comments: no lob noted, see above for gait pattern  FUNCTIONAL TESTS:  5 times sit to stand: 21.68 with heavy UE  Timed up and go (TUG): 34.8 sec RW 10 meter walk test: 25.28 sec / .395 m/s  PATIENT SURVEYS:  FOTO 46  TODAY'S TREATMENT:                                                                                                                              DATE:   THEREX:   Standing interventions with R prosthetic donned: Dynamic lateral weight shifting left to right - attempting to not let Right prosthesis side buckle- x 15 reps.   Ambulate 4x length of // bars with focus on weight shift with decreasing UE support   Superset: performed 2 sets: -exercise 1: ambulate 150 ft with RW and wheelchair follow with CGA with cues for step length -exercise 2: sit to stand 10x with walker in front of patient, cues for full upright position and equal weight shift   Seated:  Glute squeeze 15x   Neuro RE-ed:  Bend down pick up ball and toss into hoop x 25 ball for stability and carryover to functional activities  Airex pad: static  stand 30 seconds  Modified single limb  stance: LLE on dynadisc 30 seconds, progressed x 4 trials      PATIENT EDUCATION: Education details: POC Person educated: Patient Education method: Explanation Education comprehension: verbalized understanding  HOME EXERCISE PROGRAM: Access Code: [email or web address] URL: [email or web address] Date: 04/30/2023 Prepared by: Thresa Ross  Exercises - Standing Marching  - 1 x daily - 7 x weekly - 3 sets - 10 reps - Mini Squat with Counter Support  - 1 x daily - 7 x weekly - 3 sets - 10 reps - Single Leg Stance with Support  - 1 x daily - 7 x weekly - 3 sets - 30 sec hold - Single Leg Stance  - 1 x daily - 7 x weekly - 3 sets - 30 sec  hold  GOALS: Goals reviewed with patient? Yes  SHORT TERM GOALS: Target date: 05/23/2023     Patient will be independent in home exercise program to improve strength/mobility for better functional independence with ADLs. Baseline: No HEP currently  Goal status: INITIAL   LONG TERM GOALS: Target date: 07/18/2023   1.  Patient (> 82 years old) will complete five times sit to stand test in < 15 seconds indicating an increased LE strength and improved balance. Baseline: 21.6 sec w/ heavy UE use Goal status: INITIAL  2.  Patient will increase FOTO score to equal to or greater than  53   to demonstrate statistically significant improvement in mobility and quality of life.  Baseline: 46 Goal status: INITIAL    3.   Patient will reduce timed up and go to <11 seconds to reduce fall risk and demonstrate improved transfer/gait ability. Baseline: 34.8 sec with Rw Goal status: INITIAL  4.   Patient will increase 10 meter walk test to >.84m/s as to improve gait speed for better community ambulation and to reduce fall risk. Baseline: .395 m/s with RW Goal status: INITIAL  5.  Pt will improve to 600 ft or greater to indicate improved community mobility and safety accessing environment independently.  Baseline: unable to ambulate this  distance at eval  Goal status: INITIAL  6. .  Patient will ambulate 30 feet or greater without assistive device and without loss of balance with his prosthesis in order to indicate improved functional mobility and less reliance on assistive device. Baseline: Ambulating with rolling walker currently Goal status: INITIAL          ASSESSMENT:  CLINICAL IMPRESSION: Patient tolerates progressive stability and mobility interventions. He is highly motivated and eager to progress his function with decreasing external cueing for weight shift. Single limb modification onto prosthesis continues to be challenging for patient and an area for focus. Focus on decreasing reliance on RW and increasing duration of ambulation performed.  Patient will continue benefit from skilled physical therapy in order to improve crease his ambulatory capacity, improve his balance, and improve his ability to exercise and live a active lifestyle that will improve his long-term health and prognosis s/p trans femoral amputation.  OBJECTIVE IMPAIRMENTS: Abnormal gait, decreased activity tolerance, decreased balance, decreased endurance, decreased knowledge of use of DME, decreased mobility, difficulty walking, decreased strength, and hypomobility.   ACTIVITY LIMITATIONS: carrying, lifting, bending, standing, squatting, stairs, and locomotion level  PARTICIPATION LIMITATIONS: driving, shopping, community activity, yard work, and church  PERSONAL FACTORS: 1-2 comorbidities: DM, HTN, HLD   are also affecting patient's functional outcome.   REHAB POTENTIAL: Excellent  CLINICAL DECISION MAKING: Stable/uncomplicated  EVALUATION COMPLEXITY: Low  PLAN:  PT FREQUENCY: 1-2x/week  PT DURATION: 8 weeks  PLANNED INTERVENTIONS: Therapeutic exercises, Therapeutic activity, Neuromuscular re-education, Balance training, Gait training, Patient/Family education, Self Care, Joint mobilization, Stair training, Prosthetic training,  and DME instructions  PLAN FOR NEXT SESSION: progress HEP, POC with balance and mobility focus. Limited visits due to medicaid, keep track throughout.    Precious Bard, PT 05/10/2023, 10:20 AM

## 2023-05-10 ENCOUNTER — Ambulatory Visit: Payer: Medicaid Other

## 2023-05-10 ENCOUNTER — Telehealth (INDEPENDENT_AMBULATORY_CARE_PROVIDER_SITE_OTHER): Payer: Self-pay

## 2023-05-10 DIAGNOSIS — R262 Difficulty in walking, not elsewhere classified: Secondary | ICD-10-CM | POA: Diagnosis not present

## 2023-05-10 DIAGNOSIS — R2689 Other abnormalities of gait and mobility: Secondary | ICD-10-CM | POA: Diagnosis not present

## 2023-05-10 DIAGNOSIS — R269 Unspecified abnormalities of gait and mobility: Secondary | ICD-10-CM

## 2023-05-10 DIAGNOSIS — M6281 Muscle weakness (generalized): Secondary | ICD-10-CM

## 2023-05-10 NOTE — Telephone Encounter (Signed)
Pt made aware and states verbal understanding 

## 2023-05-10 NOTE — Telephone Encounter (Signed)
That is fine from a vascular perspective but I would double check with his PCP

## 2023-05-14 ENCOUNTER — Ambulatory Visit: Payer: Medicaid Other | Attending: Nurse Practitioner | Admitting: Physical Therapy

## 2023-05-14 DIAGNOSIS — R269 Unspecified abnormalities of gait and mobility: Secondary | ICD-10-CM | POA: Diagnosis not present

## 2023-05-14 DIAGNOSIS — M6281 Muscle weakness (generalized): Secondary | ICD-10-CM | POA: Insufficient documentation

## 2023-05-14 DIAGNOSIS — R262 Difficulty in walking, not elsewhere classified: Secondary | ICD-10-CM | POA: Diagnosis not present

## 2023-05-14 DIAGNOSIS — R2689 Other abnormalities of gait and mobility: Secondary | ICD-10-CM | POA: Insufficient documentation

## 2023-05-14 NOTE — Therapy (Signed)
OUTPATIENT PHYSICAL THERAPY NEURO TREATMENT   Patient Name: [name] MRN: [id number] DOB:[birth year], 57 y.o., male Today's Date: 05/14/2023   PCP:   Alba Cory, MD   REFERRING PROVIDER: Georgiana Spinner, NP   END OF SESSION:  PT End of Session - 05/14/23 0817     Visit Number 5    Number of Visits 16    Date for PT Re-Evaluation 06/22/23    Progress Note Due on Visit 10    PT Start Time 0803    PT Stop Time 0844    PT Time Calculation (min) 41 min    Equipment Utilized During Treatment Gait belt    Activity Tolerance Patient tolerated treatment well    Behavior During Therapy WFL for tasks assessed/performed                Past Medical History:  Diagnosis Date   Diabetes mellitus without complication (HCC)    Hyperlipidemia    Hypertension    Past Surgical History:  Procedure Laterality Date   AMPUTATION Right 04/16/2021   Procedure: AMPUTATION  RIGHT 5TH RAY, INCISION AND DRAINAGE;  Surgeon: Felecia Shelling, DPM;  Location: [facility];  Service: Podiatry;  Laterality: Right;   AMPUTATION Right 11/24/2022   Procedure: AMPUTATION ABOVE KNEE;  Surgeon: Renford Dills, MD;  Location: [facility];  Service: Vascular;  Laterality: Right;   APPLICATION OF WOUND VAC Right 04/21/2021   Procedure: APPLICATION OF WOUND VAC;  Surgeon: Felecia Shelling, DPM;  Location: [facility];  Service: Podiatry;  Laterality: Right;   BONE BIOPSY Left 06/27/2020   Procedure: BONE BIOPSY;  Surgeon: Rosetta Posner, DPM;  Location: [facility];  Service: Podiatry;  Laterality: Left;   COLONOSCOPY WITH PROPOFOL N/A 06/22/2022   Procedure: COLONOSCOPY WITH PROPOFOL;  Surgeon: Wyline Mood, MD;  Location: [facility] ENDOSCOPY;  Service: Gastroenterology;  Laterality: N/A;   I & D EXTREMITY Right 11/22/2022   Procedure: IRRIGATION AND DEBRIDEMENT EXTREMITY, below knee amputation right leg;  Surgeon: Leafy Ro, MD;  Location: [facility];  Service: General;  Laterality: Right;   INCISION AND DRAINAGE  Left 06/27/2020   Procedure: INCISION AND DRAINAGE;  Surgeon: Rosetta Posner, DPM;  Location: [facility];  Service: Podiatry;  Laterality: Left;   IRRIGATION AND DEBRIDEMENT FOOT Right 04/21/2021   Procedure: IRRIGATION AND DEBRIDEMENT FOOT;  Surgeon: Felecia Shelling, DPM;  Location: [facility];  Service: Podiatry;  Laterality: Right;   LOWER EXTREMITY ANGIOGRAPHY Left 06/30/2020   Procedure: Lower Extremity Angiography;  Surgeon: Annice Needy, MD;  Location: [facility] INV[facility] CV LAB;  Service: Cardiovascular;  Laterality: Left;   LOWER EXTREMITY ANGIOGRAPHY Right 04/14/2021   Procedure: Lower Extremity Angiography;  Surgeon: Annice Needy, MD;  Location: [facility];  Service: Cardiovascular;  Laterality: Right;   NO PAST SURGERIES     WOUND DEBRIDEMENT Right 04/19/2021   Procedure: DEBRIDEMENT WOUND;  Surgeon: Felecia Shelling, DPM;  Location: [facility];  Service: Podiatry;  Laterality: Right;   Patient Active Problem List   Diagnosis Date Noted   History of above knee amputation, right (HCC) 12/13/2022   Elevated troponin 11/21/2022   B12 deficiency 09/05/2022   Proliferative diabetic retinopathy of both eyes without macular edema associated with type 2 diabetes mellitus (HCC) 06/05/2022   PAD (peripheral artery disease) (HCC) 04/12/2021   Secondary renal hyperparathyroidism (HCC) 03/19/2019   Dyslipidemia associated with type 2 diabetes mellitus (HCC) 07/03/2018   Vitamin D deficiency 04/11/2018   ED (erectile  dysfunction) 04/10/2018   Lower urinary tract symptoms (LUTS) 02/09/2017   Hypertension 07/05/2015   Hyperglycemia due to type 2 diabetes mellitus (HCC) 07/05/2015   HLD (hyperlipidemia) 07/05/2015    ONSET DATE: 11/25/23  REFERRING DIAG: Z61.096 (ICD-10-CM) - History of above knee amputation, right (HCC)   THERAPY DIAG:  Abnormality of gait and mobility  Difficulty in walking, not elsewhere classified  Muscle weakness (generalized)  Other abnormalities of gait and  mobility  Rationale for Evaluation and Treatment: Rehabilitation  SUBJECTIVE:                                                                                                                                                                                             SUBJECTIVE STATEMENT: Patient reports no changes since last session Has been working hard on HEP.      Pt accompanied by: significant other  PERTINENT HISTORY: per referring note Quindarius Plourde is a right transfemoral amputee.  At this time he does not have any comorbidities that would impact his mobility or his ability to function with his prosthetic.  His limb is well-healed and ready to be fit with his first prosthesis.  He verbally communicates a strong desire to get a prosthesis.  Currently he is wheelchair-bound but is expected to be able to ambulate with no assistive devices with a prosthesis.  [place] is a K2 level ambulator that spends significant amount of time walking around inside and outside his home.  He will benefit from a K2 level prosthesis with a hydraulic ankle foot for improved stability.   PAIN:  Are you having pain? No  PRECAUTIONS: Fall  WEIGHT BEARING RESTRICTIONS: No  FALLS: Has patient fallen in last 6 months? No  LIVING ENVIRONMENT: Ask visit 2   PLOF: Independent  PATIENT GOALS: Walking better, mowing yard, walk around walking track, walking on uneven terrain, balancing on uneven terrain, using equipment at the [facility]   OBJECTIVE: (objective measures completed at initial evaluation unless otherwise dated)     COGNITION: Overall cognitive status: Within functional limits for tasks assessed   COORDINATION: WNL   POSTURE: posterior pelvic tilt and seated posture in personal WC  LOWER EXTREMITY ROM:      (Blank rows = not tested)  LOWER EXTREMITY MMT:  test later date if indicated, did not want to use eval time to doff / don prosthetic for proper testing   MMT Right Eval Left Eval  Hip  flexion    Hip extension    Hip abduction    Hip adduction    Hip internal rotation    Hip external rotation  Knee flexion    Knee extension    Ankle dorsiflexion    Ankle plantarflexion    Ankle inversion    Ankle eversion    (Blank rows = not tested)  BED MOBILITY:  Pt report no problems with bed mobility at this time.   TRANSFERS: Assistive device utilized: Environmental consultant - 2 wheeled  Sit to stand: Modified independence Stand to sit: Modified independence Chair to chair: Modified independence Floor:  not assessed     STAIRS: Assess at later date if indicated   GAIT: Gait pattern: step through pattern, decreased step length- Left, decreased stance time- Right, decreased stride length, and circumduction- Right Distance walked: 30 ft Assistive device utilized: Environmental consultant - 2 wheeled used [employer] for more room for [employer].  Level of assistance: Modified independence Comments: no lob noted, see above for gait pattern  FUNCTIONAL TESTS:  5 times sit to stand: 21.68 with heavy UE  Timed up and go (TUG): 34.8 sec RW 10 meter walk test: 25.28 sec / .395 m/s  PATIENT SURVEYS:  FOTO 46  TODAY'S TREATMENT:                                                                                                                              DATE:   THEREX:     Superset: performed 2 sets: -exercise 1: ambulate 150 ft with RW and wheelchair follow with CGA with cues for step length -exercise 2: standing weight shifting with progression to heel lift with weight shift   Seated:  Glute press downs with GTB resistance x 20 reps   Neuro RE-ed:   Reach to right side and toss into hoop x 25 ball for stability and carryover to functional activities  Split stance R LE post 3 x 45 sec  R LE SLS with UE support 2 x 30 sec   Sidestepping x 2 laps ( down and back) with fingertip support      PATIENT EDUCATION: Education details: POC Person educated: Patient Education method:  Explanation Education comprehension: verbalized understanding  HOME EXERCISE PROGRAM: Access Code: [email or web address] URL: [email or web address] Date: 04/30/2023 Prepared by: Thresa Ross  Exercises - Standing Marching  - 1 x daily - 7 x weekly - 3 sets - 10 reps - Mini Squat with Counter Support  - 1 x daily - 7 x weekly - 3 sets - 10 reps - Single Leg Stance with Support  - 1 x daily - 7 x weekly - 3 sets - 30 sec hold - Single Leg Stance  - 1 x daily - 7 x weekly - 3 sets - 30 sec  hold  GOALS: Goals reviewed with patient? Yes  SHORT TERM GOALS: Target date: 05/23/2023     Patient will be independent in home exercise program to improve strength/mobility for better functional independence with ADLs. Baseline: No HEP currently  Goal status: INITIAL   LONG TERM GOALS: Target date: 07/18/2023   1.  Patient (>  73 years old) will complete five times sit to stand test in < 15 seconds indicating an increased LE strength and improved balance. Baseline: 21.6 sec w/ heavy UE use Goal status: INITIAL  2.  Patient will increase FOTO score to equal to or greater than  53   to demonstrate statistically significant improvement in mobility and quality of life.  Baseline: 46 Goal status: INITIAL    3.   Patient will reduce timed up and go to <11 seconds to reduce fall risk and demonstrate improved transfer/gait ability. Baseline: 34.8 sec with Rw Goal status: INITIAL  4.   Patient will increase 10 meter walk test to >.49m/s as to improve gait speed for better community ambulation and to reduce fall risk. Baseline: .395 m/s with RW Goal status: INITIAL  5.  Pt will improve to 600 ft or greater to indicate improved community mobility and safety accessing environment independently.  Baseline: unable to ambulate this distance at eval  Goal status: INITIAL  6. .  Patient will ambulate 30 feet or greater without assistive device and without loss of balance with his prosthesis  in order to indicate improved functional mobility and less reliance on assistive device. Baseline: Ambulating with rolling walker currently Goal status: INITIAL          ASSESSMENT:  CLINICAL IMPRESSION: Patient tolerates progressive stability and mobility interventions.  Single limb modification onto prosthesis continues to be challenging for patient and an area for focus. Focus on decreasing reliance on RW and increasing duration of ambulation performed.  Patient will continue benefit from skilled physical therapy in order to improve crease his ambulatory capacity, improve his balance, and improve his ability to exercise and live a active lifestyle that will improve his long-term health and prognosis s/p trans femoral amputation.  OBJECTIVE IMPAIRMENTS: Abnormal gait, decreased activity tolerance, decreased balance, decreased endurance, decreased knowledge of use of DME, decreased mobility, difficulty walking, decreased strength, and hypomobility.   ACTIVITY LIMITATIONS: carrying, lifting, bending, standing, squatting, stairs, and locomotion level  PARTICIPATION LIMITATIONS: driving, shopping, community activity, yard work, and church  PERSONAL FACTORS: 1-2 comorbidities: DM, HTN, HLD   are also affecting patient's functional outcome.   REHAB POTENTIAL: Excellent  CLINICAL DECISION MAKING: Stable/uncomplicated  EVALUATION COMPLEXITY: Low  PLAN:  PT FREQUENCY: 1-2x/week  PT DURATION: 8 weeks  PLANNED INTERVENTIONS: Therapeutic exercises, Therapeutic activity, Neuromuscular re-education, Balance training, Gait training, Patient/Family education, Self Care, Joint mobilization, Stair training, Prosthetic training, and DME instructions  PLAN FOR NEXT SESSION: progress HEP, POC with balance and mobility focus. Limited visits due to medicaid, keep track throughout.    Norman Herrlich, PT 05/14/2023, 8:17 AM

## 2023-05-17 ENCOUNTER — Ambulatory Visit: Payer: Medicaid Other | Admitting: Physical Therapy

## 2023-05-17 DIAGNOSIS — R262 Difficulty in walking, not elsewhere classified: Secondary | ICD-10-CM

## 2023-05-17 DIAGNOSIS — M6281 Muscle weakness (generalized): Secondary | ICD-10-CM | POA: Diagnosis not present

## 2023-05-17 DIAGNOSIS — R2689 Other abnormalities of gait and mobility: Secondary | ICD-10-CM

## 2023-05-17 DIAGNOSIS — R269 Unspecified abnormalities of gait and mobility: Secondary | ICD-10-CM

## 2023-05-17 NOTE — Therapy (Signed)
OUTPATIENT PHYSICAL THERAPY NEURO TREATMENT   Patient Name: [name] MRN: [id number] DOB:May 20, 1966, 57 y.o., male Today's Date: 05/17/2023   PCP:   Zachary Cory, MD   REFERRING PROVIDER: Georgiana Spinner, NP   END OF SESSION:  PT End of Session - 05/17/23 1451     Visit Number 6    Number of Visits 16    Date for PT Re-Evaluation 06/22/23    Progress Note Due on Visit 10    PT Start Time 1438    PT Stop Time 1515    PT Time Calculation (min) 37 min    Equipment Utilized During Treatment Gait belt    Activity Tolerance Patient tolerated treatment well    Behavior During Therapy WFL for tasks assessed/performed                 Past Medical History:  Diagnosis Date   Diabetes mellitus without complication (HCC)    Hyperlipidemia    Hypertension    Past Surgical History:  Procedure Laterality Date   AMPUTATION Right 04/16/2021   Procedure: AMPUTATION  RIGHT 5TH RAY, INCISION AND DRAINAGE;  Surgeon: [name], DPM;  Location: [facility];  Service: Podiatry;  Laterality: Right;   AMPUTATION Right 11/24/2022   Procedure: AMPUTATION ABOVE KNEE;  Surgeon: Zachary Dills, MD;  Location: [facility];  Service: Vascular;  Laterality: Right;   APPLICATION OF WOUND VAC Right 04/21/2021   Procedure: APPLICATION OF WOUND VAC;  Surgeon: [name], DPM;  Location: [facility];  Service: Podiatry;  Laterality: Right;   BONE BIOPSY Left 06/27/2020   Procedure: BONE BIOPSY;  Surgeon: [name], DPM;  Location: [facility];  Service: Podiatry;  Laterality: Left;   COLONOSCOPY WITH PROPOFOL N/A 06/22/2022   Procedure: COLONOSCOPY WITH PROPOFOL;  Surgeon: Zachary Mood, MD;  Location: [facility];  Service: Gastroenterology;  Laterality: N/A;   I & D EXTREMITY Right 11/22/2022   Procedure: IRRIGATION AND DEBRIDEMENT EXTREMITY, below knee amputation right leg;  Surgeon: Zachary Ro, MD;  Location: [facility];  Service: General;  Laterality: Right;   INCISION AND DRAINAGE  Left 06/27/2020   Procedure: INCISION AND DRAINAGE;  Surgeon: [name], DPM;  Location: [facility];  Service: Podiatry;  Laterality: Left;   IRRIGATION AND DEBRIDEMENT FOOT Right 04/21/2021   Procedure: IRRIGATION AND DEBRIDEMENT FOOT;  Surgeon: [name], DPM;  Location: [facility];  Service: Podiatry;  Laterality: Right;   LOWER EXTREMITY ANGIOGRAPHY Left 06/30/2020   Procedure: Lower Extremity Angiography;  Surgeon: Zachary Needy, MD;  Location: [facility];  Service: Cardiovascular;  Laterality: Left;   LOWER EXTREMITY ANGIOGRAPHY Right 04/14/2021   Procedure: Lower Extremity Angiography;  Surgeon: Zachary Needy, MD;  Location: [facility];  Service: Cardiovascular;  Laterality: Right;   NO PAST SURGERIES     WOUND DEBRIDEMENT Right 04/19/2021   Procedure: DEBRIDEMENT WOUND;  Surgeon: [name], DPM;  Location: [facility];  Service: Podiatry;  Laterality: Right;   Patient Active Problem List   Diagnosis Date Noted   History of above knee amputation, right (HCC) 12/13/2022   Elevated troponin 11/21/2022   B12 deficiency 09/05/2022   Proliferative diabetic retinopathy of both eyes without macular edema associated with type 2 diabetes mellitus (HCC) 06/05/2022   PAD (peripheral artery disease) (HCC) 04/12/2021   Secondary renal hyperparathyroidism (HCC) 03/19/2019   Dyslipidemia associated with type 2 diabetes mellitus (HCC) 07/03/2018   Vitamin D deficiency 04/11/2018   ED (  erectile dysfunction) 04/10/2018   Lower urinary tract symptoms (LUTS) 02/09/2017   Hypertension 07/05/2015   Hyperglycemia due to type 2 diabetes mellitus (HCC) 07/05/2015   HLD (hyperlipidemia) 07/05/2015    ONSET DATE: 11/25/23  REFERRING DIAG: Z61.096 (ICD-10-CM) - History of above knee amputation, right (HCC)   THERAPY DIAG:  Abnormality of gait and mobility  Difficulty in walking, not elsewhere classified  Muscle weakness (generalized)  Other abnormalities of gait and  mobility  Rationale for Evaluation and Treatment: Rehabilitation  SUBJECTIVE:                                                                                                                                                                                             SUBJECTIVE STATEMENT:  Pt reports doing well today. Pt denies any recent falls/stumbles since prior session. Pt denies any updates to medications or medical appointment since prior session. Pt reports good compliance with HEP when time permits.      Pt accompanied by: significant other  PERTINENT HISTORY: per referring note [name] is a right transfemoral amputee.  At this time he does not have any comorbidities that would impact his mobility or his ability to function with his prosthetic.  His limb is well-healed and ready to be fit with his first prosthesis.  He verbally communicates a strong desire to get a prosthesis.  Currently he is wheelchair-bound but is expected to be able to ambulate with no assistive devices with a prosthesis.  [name] is a K2 level ambulator that spends significant amount of time walking around inside and outside his home.  He will benefit from a K2 level prosthesis with a hydraulic ankle foot for improved stability.   PAIN:  Are you having pain? No  PRECAUTIONS: Fall  WEIGHT BEARING RESTRICTIONS: No  FALLS: Has patient fallen in last 6 months? No  LIVING ENVIRONMENT: Ask visit 2   PLOF: Independent  PATIENT GOALS: Walking better, mowing yard, walk around walking track, walking on uneven terrain, balancing on uneven terrain, using equipment at the [facility]   OBJECTIVE: (objective measures completed at initial evaluation unless otherwise dated)     COGNITION: Overall cognitive status: Within functional limits for tasks assessed   COORDINATION: WNL   POSTURE: posterior pelvic tilt and seated posture in personal WC  LOWER EXTREMITY ROM:      (Blank rows = not tested)  LOWER  EXTREMITY MMT:  test later date if indicated, did not want to use eval time to doff / don prosthetic for proper testing   MMT Right Eval Left Eval  Hip flexion    Hip extension  Hip abduction    Hip adduction    Hip internal rotation    Hip external rotation    Knee flexion    Knee extension    Ankle dorsiflexion    Ankle plantarflexion    Ankle inversion    Ankle eversion    (Blank rows = not tested)  BED MOBILITY:  Pt report no problems with bed mobility at this time.   TRANSFERS: Assistive device utilized: Environmental consultant - 2 wheeled  Sit to stand: Modified independence Stand to sit: Modified independence Chair to chair: Modified independence Floor:  not assessed     STAIRS: Assess at later date if indicated   GAIT: Gait pattern: step through pattern, decreased step length- Left, decreased stance time- Right, decreased stride length, and circumduction- Right Distance walked: 30 ft Assistive device utilized: Environmental consultant - 2 wheeled used [employer] for more room for [employer].  Level of assistance: Modified independence Comments: no lob noted, see above for gait pattern  FUNCTIONAL TESTS:  5 times sit to stand: 21.68 with heavy UE  Timed up and go (TUG): 34.8 sec RW 10 meter walk test: 25.28 sec / .395 m/s  PATIENT SURVEYS:  FOTO 46  TODAY'S TREATMENT:                                                                                                                              DATE: 05/17/23  Unless otherwise stated, CGA was provided and gait belt donned in order to ensure pt safety  THERACT focusing on improving ambulatory capacity through functional movements: Intervals: performed 2 sets: -exercise 1: ambulate 180 ft with RW and wheelchair follow with CGA with cues for step length -cues for improving weight bearing on R LE> UE bilateral.  NMR -exercise 2: standing weight shifting with progression to heel lift with weight shift   The following activities were completed in  parallel bars or at balance bar   Neuro RE-ed:   R LE SLS progression  R LE on floor, LLE on 6 in step anterior 3 x 30 sec intervals   TA:  Ambulation in // bars LUE support only x 8 laps then seated rest   Ambulation in // bars with LUE and retro walking back to start x 4 rounds then seated rest   Ambulation without UE assist/ intermittent UE assist. Decreased stance time on the R, cues for step length to minimize chances of R LE buckling, x 8 times through. A few good rounds with increased stance time on the R after a few rounds of practice     PATIENT EDUCATION: Education details: POC Person educated: Patient Education method: Explanation Education comprehension: verbalized understanding  HOME EXERCISE PROGRAM: Access Code: [email or web address] URL: [email or web address] Date: 04/30/2023 Prepared by: Thresa Ross  Exercises - Standing Marching  - 1 x daily - 7 x weekly - 3 sets - 10 reps - Mini Squat with Counter Support  - 1 x daily - 7  x weekly - 3 sets - 10 reps - Single Leg Stance with Support  - 1 x daily - 7 x weekly - 3 sets - 30 sec hold - Single Leg Stance  - 1 x daily - 7 x weekly - 3 sets - 30 sec  hold  GOALS: Goals reviewed with patient? Yes  SHORT TERM GOALS: Target date: 05/23/2023     Patient will be independent in home exercise program to improve strength/mobility for better functional independence with ADLs. Baseline: No HEP currently  Goal status: INITIAL   LONG TERM GOALS: Target date: 07/18/2023   1.  Patient (> 46 years old) will complete five times sit to stand test in < 15 seconds indicating an increased LE strength and improved balance. Baseline: 21.6 sec w/ heavy UE use Goal status: INITIAL  2.  Patient will increase FOTO score to equal to or greater than  53   to demonstrate statistically significant improvement in mobility and quality of life.  Baseline: 46 Goal status: INITIAL    3.   Patient will reduce timed up and go to  <11 seconds to reduce fall risk and demonstrate improved transfer/gait ability. Baseline: 34.8 sec with Rw Goal status: INITIAL  4.   Patient will increase 10 meter walk test to >.72m/s as to improve gait speed for better community ambulation and to reduce fall risk. Baseline: .395 m/s with RW Goal status: INITIAL  5.  Pt will improve to 600 ft or greater to indicate improved community mobility and safety accessing environment independently.  Baseline: unable to ambulate this distance at eval  Goal status: INITIAL  6. .  Patient will ambulate 30 feet or greater without assistive device and without loss of balance with his prosthesis in order to indicate improved functional mobility and less reliance on assistive device. Baseline: Ambulating with rolling walker currently Goal status: INITIAL    ASSESSMENT:  CLINICAL IMPRESSION:  Note: Portions of this document were prepared using Dragon voice recognition software and although reviewed may contain unintentional dictation errors in syntax, grammar, or spelling.  Patient tolerates progressive stability and mobility interventions.  Single limb modification onto prosthesis continues to be challenging for patient and an area for focus. Pt able to ambulate without UE support in // bars consistently but unable to complete with equal step length bilaterally and quickly gets off of R LE on most reps ( decreased stance time on R and decreased step length on L).  Patient will continue benefit from skilled physical therapy in order to increase his ambulatory capacity, improve his balance, and improve his ability to exercise and live a active lifestyle that will improve his long-term health and prognosis s/p trans femoral amputation.  OBJECTIVE IMPAIRMENTS: Abnormal gait, decreased activity tolerance, decreased balance, decreased endurance, decreased knowledge of use of DME, decreased mobility, difficulty walking, decreased strength, and  hypomobility.   ACTIVITY LIMITATIONS: carrying, lifting, bending, standing, squatting, stairs, and locomotion level  PARTICIPATION LIMITATIONS: driving, shopping, community activity, yard work, and church  PERSONAL FACTORS: 1-2 comorbidities: DM, HTN, HLD   are also affecting patient's functional outcome.   REHAB POTENTIAL: Excellent  CLINICAL DECISION MAKING: Stable/uncomplicated  EVALUATION COMPLEXITY: Low  PLAN:  PT FREQUENCY: 1-2x/week  PT DURATION: 8 weeks  PLANNED INTERVENTIONS: Therapeutic exercises, Therapeutic activity, Neuromuscular re-education, Balance training, Gait training, Patient/Family education, Self Care, Joint mobilization, Stair training, Prosthetic training, and DME instructions  PLAN FOR NEXT SESSION: progress HEP, POC with balance and mobility focus. Limited visits due  to medicaid, keep track throughout.    Norman Herrlich, PT 05/17/2023, 3:34 PM

## 2023-05-20 ENCOUNTER — Other Ambulatory Visit: Payer: Self-pay | Admitting: Family Medicine

## 2023-05-20 DIAGNOSIS — I10 Essential (primary) hypertension: Secondary | ICD-10-CM

## 2023-05-21 ENCOUNTER — Ambulatory Visit: Payer: Medicaid Other

## 2023-05-21 DIAGNOSIS — M6281 Muscle weakness (generalized): Secondary | ICD-10-CM | POA: Diagnosis not present

## 2023-05-21 DIAGNOSIS — R2689 Other abnormalities of gait and mobility: Secondary | ICD-10-CM

## 2023-05-21 DIAGNOSIS — R262 Difficulty in walking, not elsewhere classified: Secondary | ICD-10-CM | POA: Diagnosis not present

## 2023-05-21 DIAGNOSIS — R269 Unspecified abnormalities of gait and mobility: Secondary | ICD-10-CM | POA: Diagnosis not present

## 2023-05-21 NOTE — Therapy (Signed)
OUTPATIENT PHYSICAL THERAPY NEURO TREATMENT   Patient Name: [name] MRN: [id number] DOB:23-Nov-1966, 57 y.o., male Today's Date: 05/22/2023   PCP:   Alba Cory, MD   REFERRING PROVIDER: Georgiana Spinner, NP   END OF SESSION:  PT End of Session - 05/21/23 1356     Visit Number 7    Number of Visits 16    Date for PT Re-Evaluation 06/22/23    Progress Note Due on Visit 10    PT Start Time 1355    PT Stop Time 1435    PT Time Calculation (min) 40 min    Equipment Utilized During Treatment Gait belt    Activity Tolerance Patient tolerated treatment well    Behavior During Therapy WFL for tasks assessed/performed                 Past Medical History:  Diagnosis Date   Diabetes mellitus without complication (HCC)    Hyperlipidemia    Hypertension    Past Surgical History:  Procedure Laterality Date   AMPUTATION Right 04/16/2021   Procedure: AMPUTATION  RIGHT 5TH RAY, INCISION AND DRAINAGE;  Surgeon: Felecia Shelling, DPM;  Location: [facility];  Service: Podiatry;  Laterality: Right;   AMPUTATION Right 11/24/2022   Procedure: AMPUTATION ABOVE KNEE;  Surgeon: Renford Dills, MD;  Location: [facility];  Service: Vascular;  Laterality: Right;   APPLICATION OF WOUND VAC Right 04/21/2021   Procedure: APPLICATION OF WOUND VAC;  Surgeon: Felecia Shelling, DPM;  Location: [facility];  Service: Podiatry;  Laterality: Right;   BONE BIOPSY Left 06/27/2020   Procedure: BONE BIOPSY;  Surgeon: Rosetta Posner, DPM;  Location: [facility];  Service: Podiatry;  Laterality: Left;   COLONOSCOPY WITH PROPOFOL N/A 06/22/2022   Procedure: COLONOSCOPY WITH PROPOFOL;  Surgeon: Wyline Mood, MD;  Location: [facility];  Service: Gastroenterology;  Laterality: N/A;   I & D EXTREMITY Right 11/22/2022   Procedure: IRRIGATION AND DEBRIDEMENT EXTREMITY, below knee amputation right leg;  Surgeon: Leafy Ro, MD;  Location: [facility];  Service: General;  Laterality: Right;   INCISION AND DRAINAGE  Left 06/27/2020   Procedure: INCISION AND DRAINAGE;  Surgeon: Rosetta Posner, DPM;  Location: [facility];  Service: Podiatry;  Laterality: Left;   IRRIGATION AND DEBRIDEMENT FOOT Right 04/21/2021   Procedure: IRRIGATION AND DEBRIDEMENT FOOT;  Surgeon: Felecia Shelling, DPM;  Location: [facility];  Service: Podiatry;  Laterality: Right;   LOWER EXTREMITY ANGIOGRAPHY Left 06/30/2020   Procedure: Lower Extremity Angiography;  Surgeon: Annice Needy, MD;  Location: [facility];  Service: Cardiovascular;  Laterality: Left;   LOWER EXTREMITY ANGIOGRAPHY Right 04/14/2021   Procedure: Lower Extremity Angiography;  Surgeon: Annice Needy, MD;  Location: [facility];  Service: Cardiovascular;  Laterality: Right;   NO PAST SURGERIES     WOUND DEBRIDEMENT Right 04/19/2021   Procedure: DEBRIDEMENT WOUND;  Surgeon: Felecia Shelling, DPM;  Location: [facility];  Service: Podiatry;  Laterality: Right;   Patient Active Problem List   Diagnosis Date Noted   History of above knee amputation, right (HCC) 12/13/2022   Elevated troponin 11/21/2022   B12 deficiency 09/05/2022   Proliferative diabetic retinopathy of both eyes without macular edema associated with type 2 diabetes mellitus (HCC) 06/05/2022   PAD (peripheral artery disease) (HCC) 04/12/2021   Secondary renal hyperparathyroidism (HCC) 03/19/2019   Dyslipidemia associated with type 2 diabetes mellitus (HCC) 07/03/2018   Vitamin D deficiency 04/11/2018   ED (  erectile dysfunction) 04/10/2018   Lower urinary tract symptoms (LUTS) 02/09/2017   Hypertension 07/05/2015   Hyperglycemia due to type 2 diabetes mellitus (HCC) 07/05/2015   HLD (hyperlipidemia) 07/05/2015    ONSET DATE: 11/25/23  REFERRING DIAG: Z61.096 (ICD-10-CM) - History of above knee amputation, right (HCC)   THERAPY DIAG:  Abnormality of gait and mobility  Difficulty in walking, not elsewhere classified  Muscle weakness (generalized)  Other abnormalities of gait and  mobility  Rationale for Evaluation and Treatment: Rehabilitation  SUBJECTIVE:                                                                                                                                                                                             SUBJECTIVE STATEMENT:  Pt reports doing well and staying compliant with current HEP. Denies any pain or falls.      Pt accompanied by: significant other  PERTINENT HISTORY: per referring note [name] is a right transfemoral amputee.  At this time he does not have any comorbidities that would impact his mobility or his ability to function with his prosthetic.  His limb is well-healed and ready to be fit with his first prosthesis.  He verbally communicates a strong desire to get a prosthesis.  Currently he is wheelchair-bound but is expected to be able to ambulate with no assistive devices with a prosthesis.  Roarke is a K2 level ambulator that spends significant amount of time walking around inside and outside his home.  He will benefit from a K2 level prosthesis with a hydraulic ankle foot for improved stability.   PAIN:  Are you having pain? No  PRECAUTIONS: Fall  WEIGHT BEARING RESTRICTIONS: No  FALLS: Has patient fallen in last 6 months? No  LIVING ENVIRONMENT: Ask visit 2   PLOF: Independent  PATIENT GOALS: Walking better, mowing yard, walk around walking track, walking on uneven terrain, balancing on uneven terrain, using equipment at the [facility]   OBJECTIVE: (objective measures completed at initial evaluation unless otherwise dated)     COGNITION: Overall cognitive status: Within functional limits for tasks assessed   COORDINATION: WNL   POSTURE: posterior pelvic tilt and seated posture in personal WC  LOWER EXTREMITY ROM:      (Blank rows = not tested)  LOWER EXTREMITY MMT:  test later date if indicated, did not want to use eval time to doff / don prosthetic for proper testing   MMT Right Eval  Left Eval  Hip flexion    Hip extension    Hip abduction    Hip adduction    Hip internal rotation    Hip  external rotation    Knee flexion    Knee extension    Ankle dorsiflexion    Ankle plantarflexion    Ankle inversion    Ankle eversion    (Blank rows = not tested)  BED MOBILITY:  Pt report no problems with bed mobility at this time.   TRANSFERS: Assistive device utilized: Environmental consultant - 2 wheeled  Sit to stand: Modified independence Stand to sit: Modified independence Chair to chair: Modified independence Floor:  not assessed     STAIRS: Assess at later date if indicated   GAIT: Gait pattern: step through pattern, decreased step length- Left, decreased stance time- Right, decreased stride length, and circumduction- Right Distance walked: 30 ft Assistive device utilized: Environmental consultant - 2 wheeled used [employer] for more room for [employer].  Level of assistance: Modified independence Comments: no lob noted, see above for gait pattern  FUNCTIONAL TESTS:  5 times sit to stand: 21.68 with heavy UE  Timed up and go (TUG): 34.8 sec RW 10 meter walk test: 25.28 sec / .395 m/s  PATIENT SURVEYS:  FOTO 46  TODAY'S TREATMENT:                                                                                                                              DATE: 05/21/2023  Unless otherwise stated, CGA was provided and gait belt donned in order to ensure pt safety  THERAPEUTIC ACTIVITIES: focusing on improving ambulatory capacity through functional movements:   - ambulate 160 ft with RW and wheelchair follow with CGA with cues for step length and decreased BUE support   Neuro RE-ed:   Static standing without UE support - hold 30 sec x 3 (mild A/P/lateral sway yet no LOB)  Dynamic lateral weight shift - Left to right  without UE Support x 25 reps each- focusing on increased WB through right prosthesis. Dynamic standing without UE support with ball toss (directly to him) then some left to  right ball toss- to challenge his WB and limites of stability.  Dynamic standing - weight shift to right and dynamic hip march on left  Dynamic standing in staggered position - hold 30 sec x3 each side Dynamic standing in staggered position- with ball toss x 1 min        PATIENT EDUCATION: Education details: POC Person educated: Patient Education method: Explanation Education comprehension: verbalized understanding  HOME EXERCISE PROGRAM: Access Code: [email or web address] URL: [email or web address] Date: 04/30/2023 Prepared by: Thresa Ross  Exercises - Standing Marching  - 1 x daily - 7 x weekly - 3 sets - 10 reps - Mini Squat with Counter Support  - 1 x daily - 7 x weekly - 3 sets - 10 reps - Single Leg Stance with Support  - 1 x daily - 7 x weekly - 3 sets - 30 sec hold - Single Leg Stance  - 1 x daily - 7 x weekly - 3 sets - 30 sec  hold  GOALS: Goals reviewed with patient? Yes  SHORT TERM GOALS: Target date: 05/23/2023     Patient will be independent in home exercise program to improve strength/mobility for better functional independence with ADLs. Baseline: No HEP currently  Goal status: INITIAL   LONG TERM GOALS: Target date: 07/18/2023   1.  Patient (> 30 years old) will complete five times sit to stand test in < 15 seconds indicating an increased LE strength and improved balance. Baseline: 21.6 sec w/ heavy UE use Goal status: INITIAL  2.  Patient will increase FOTO score to equal to or greater than  53   to demonstrate statistically significant improvement in mobility and quality of life.  Baseline: 46 Goal status: INITIAL    3.   Patient will reduce timed up and go to <11 seconds to reduce fall risk and demonstrate improved transfer/gait ability. Baseline: 34.8 sec with Rw Goal status: INITIAL  4.   Patient will increase 10 meter walk test to >.36m/s as to improve gait speed for better community ambulation and to reduce fall risk. Baseline:  .395 m/s with RW Goal status: INITIAL  5.  Pt will improve to 600 ft or greater to indicate improved community mobility and safety accessing environment independently.  Baseline: unable to ambulate this distance at eval  Goal status: INITIAL  6. .  Patient will ambulate 30 feet or greater without assistive device and without loss of balance with his prosthesis in order to indicate improved functional mobility and less reliance on assistive device. Baseline: Ambulating with rolling walker currently Goal status: INITIAL    ASSESSMENT:  CLINICAL IMPRESSION:  Treatment continued to focus on right LE weight bearing activities to help patient get acclimated to putting more weight thought his right LE without UE support and he responded well- able to increase his confidence performing some single leg activities to promote more WB through his right prosthesis- no pain and improving confidence with practice. Patient will continue benefit from skilled physical therapy in order to increase his ambulatory capacity, improve his balance, and improve his ability to exercise and live a active lifestyle that will improve his long-term health and prognosis s/p trans femoral amputation.  OBJECTIVE IMPAIRMENTS: Abnormal gait, decreased activity tolerance, decreased balance, decreased endurance, decreased knowledge of use of DME, decreased mobility, difficulty walking, decreased strength, and hypomobility.   ACTIVITY LIMITATIONS: carrying, lifting, bending, standing, squatting, stairs, and locomotion level  PARTICIPATION LIMITATIONS: driving, shopping, community activity, yard work, and church  PERSONAL FACTORS: 1-2 comorbidities: DM, HTN, HLD   are also affecting patient's functional outcome.   REHAB POTENTIAL: Excellent  CLINICAL DECISION MAKING: Stable/uncomplicated  EVALUATION COMPLEXITY: Low  PLAN:  PT FREQUENCY: 1-2x/week  PT DURATION: 8 weeks  PLANNED INTERVENTIONS: Therapeutic  exercises, Therapeutic activity, Neuromuscular re-education, Balance training, Gait training, Patient/Family education, Self Care, Joint mobilization, Stair training, Prosthetic training, and DME instructions  PLAN FOR NEXT SESSION: progress HEP, POC with balance and mobility focus. Limited visits due to medicaid, keep track throughout.    Lenda Kelp, PT 05/22/2023, 12:50 PM

## 2023-05-22 NOTE — Therapy (Signed)
OUTPATIENT PHYSICAL THERAPY NEURO TREATMENT   Patient Name: [name] MRN: [id number] DOB:09/30/1966, 57 y.o., male Today's Date: 05/23/2023   PCP:   Alba Cory, MD   REFERRING PROVIDER: Georgiana Spinner, NP   END OF SESSION:  PT End of Session - 05/23/23 1346     Visit Number 8    Number of Visits 16    Date for PT Re-Evaluation 06/22/23    Progress Note Due on Visit 10    PT Start Time 1345    PT Stop Time 1429    PT Time Calculation (min) 44 min    Equipment Utilized During Treatment Gait belt    Activity Tolerance Patient tolerated treatment well    Behavior During Therapy WFL for tasks assessed/performed                  Past Medical History:  Diagnosis Date   Diabetes mellitus without complication (HCC)    Hyperlipidemia    Hypertension    Past Surgical History:  Procedure Laterality Date   AMPUTATION Right 04/16/2021   Procedure: AMPUTATION  RIGHT 5TH RAY, INCISION AND DRAINAGE;  Surgeon: Felecia Shelling, DPM;  Location: [facility];  Service: Podiatry;  Laterality: Right;   AMPUTATION Right 11/24/2022   Procedure: AMPUTATION ABOVE KNEE;  Surgeon: Renford Dills, MD;  Location: [facility];  Service: Vascular;  Laterality: Right;   APPLICATION OF WOUND VAC Right 04/21/2021   Procedure: APPLICATION OF WOUND VAC;  Surgeon: Felecia Shelling, DPM;  Location: [facility];  Service: Podiatry;  Laterality: Right;   BONE BIOPSY Left 06/27/2020   Procedure: BONE BIOPSY;  Surgeon: Rosetta Posner, DPM;  Location: [facility];  Service: Podiatry;  Laterality: Left;   COLONOSCOPY WITH PROPOFOL N/A 06/22/2022   Procedure: COLONOSCOPY WITH PROPOFOL;  Surgeon: Wyline Mood, MD;  Location: [facility];  Service: Gastroenterology;  Laterality: N/A;   I & D EXTREMITY Right 11/22/2022   Procedure: IRRIGATION AND DEBRIDEMENT EXTREMITY, below knee amputation right leg;  Surgeon: Leafy Ro, MD;  Location: [facility];  Service: General;  Laterality: Right;   INCISION AND  DRAINAGE Left 06/27/2020   Procedure: INCISION AND DRAINAGE;  Surgeon: Rosetta Posner, DPM;  Location: [facility];  Service: Podiatry;  Laterality: Left;   IRRIGATION AND DEBRIDEMENT FOOT Right 04/21/2021   Procedure: IRRIGATION AND DEBRIDEMENT FOOT;  Surgeon: Felecia Shelling, DPM;  Location: [facility];  Service: Podiatry;  Laterality: Right;   LOWER EXTREMITY ANGIOGRAPHY Left 06/30/2020   Procedure: Lower Extremity Angiography;  Surgeon: Annice Needy, MD;  Location: [facility];  Service: Cardiovascular;  Laterality: Left;   LOWER EXTREMITY ANGIOGRAPHY Right 04/14/2021   Procedure: Lower Extremity Angiography;  Surgeon: Annice Needy, MD;  Location: [facility];  Service: Cardiovascular;  Laterality: Right;   NO PAST SURGERIES     WOUND DEBRIDEMENT Right 04/19/2021   Procedure: DEBRIDEMENT WOUND;  Surgeon: Felecia Shelling, DPM;  Location: [facility];  Service: Podiatry;  Laterality: Right;   Patient Active Problem List   Diagnosis Date Noted   History of above knee amputation, right (HCC) 12/13/2022   Elevated troponin 11/21/2022   B12 deficiency 09/05/2022   Proliferative diabetic retinopathy of both eyes without macular edema associated with type 2 diabetes mellitus (HCC) 06/05/2022   PAD (peripheral artery disease) (HCC) 04/12/2021   Secondary renal hyperparathyroidism (HCC) 03/19/2019   Dyslipidemia associated with type 2 diabetes mellitus (HCC) 07/03/2018   Vitamin D deficiency 04/11/2018  ED (erectile dysfunction) 04/10/2018   Lower urinary tract symptoms (LUTS) 02/09/2017   Hypertension 07/05/2015   Hyperglycemia due to type 2 diabetes mellitus (HCC) 07/05/2015   HLD (hyperlipidemia) 07/05/2015    ONSET DATE: 11/25/23  REFERRING DIAG: Z61.096 (ICD-10-CM) - History of above knee amputation, right (HCC)   THERAPY DIAG:  Abnormality of gait and mobility  Difficulty in walking, not elsewhere classified  Muscle weakness (generalized)  Rationale for Evaluation and  Treatment: Rehabilitation  SUBJECTIVE:                                                                                                                                                                                             SUBJECTIVE STATEMENT:  Patient reports no falls or LOB since last session. Reports he has been up and walking.     Pt accompanied by: significant other  PERTINENT HISTORY: per referring note [name] is a right transfemoral amputee.  At this time he does not have any comorbidities that would impact his mobility or his ability to function with his prosthetic.  His limb is well-healed and ready to be fit with his first prosthesis.  He verbally communicates a strong desire to get a prosthesis.  Currently he is wheelchair-bound but is expected to be able to ambulate with no assistive devices with a prosthesis.  [name] is a K2 level ambulator that spends significant amount of time walking around inside and outside his home.  He will benefit from a K2 level prosthesis with a hydraulic ankle foot for improved stability.   PAIN:  Are you having pain? No  PRECAUTIONS: Fall  WEIGHT BEARING RESTRICTIONS: No  FALLS: Has patient fallen in last 6 months? No  LIVING ENVIRONMENT: Ask visit 2   PLOF: Independent  PATIENT GOALS: Walking better, mowing yard, walk around walking track, walking on uneven terrain, balancing on uneven terrain, using equipment at the [facility]   OBJECTIVE: (objective measures completed at initial evaluation unless otherwise dated)     COGNITION: Overall cognitive status: Within functional limits for tasks assessed   COORDINATION: WNL   POSTURE: posterior pelvic tilt and seated posture in personal WC  LOWER EXTREMITY ROM:      (Blank rows = not tested)  LOWER EXTREMITY MMT:  test later date if indicated, did not want to use eval time to doff / don prosthetic for proper testing   MMT Right Eval Left Eval  Hip flexion    Hip extension     Hip abduction    Hip adduction    Hip internal rotation    Hip external rotation    Knee  flexion    Knee extension    Ankle dorsiflexion    Ankle plantarflexion    Ankle inversion    Ankle eversion    (Blank rows = not tested)  BED MOBILITY:  Pt report no problems with bed mobility at this time.   TRANSFERS: Assistive device utilized: Environmental consultant - 2 wheeled  Sit to stand: Modified independence Stand to sit: Modified independence Chair to chair: Modified independence Floor:  not assessed     STAIRS: Assess at later date if indicated   GAIT: Gait pattern: step through pattern, decreased step length- Left, decreased stance time- Right, decreased stride length, and circumduction- Right Distance walked: 30 ft Assistive device utilized: Environmental consultant - 2 wheeled used [employer] for more room for [employer].  Level of assistance: Modified independence Comments: no lob noted, see above for gait pattern  FUNCTIONAL TESTS:  5 times sit to stand: 21.68 with heavy UE  Timed up and go (TUG): 34.8 sec RW 10 meter walk test: 25.28 sec / .395 m/s  PATIENT SURVEYS:  FOTO 46  TODAY'S TREATMENT:                                                                                                                              DATE: 05/23/23  Unless otherwise stated, CGA was provided and gait belt donned in order to ensure pt safety  THERACT focusing on improving ambulatory capacity through functional movements:  - ambulate 550 ft with RW and wheelchair follow with CGA with cues for step length and decreased BUE support   March with SUE support progressed to using cane for march 20x   With cane: -march 15x each side -reaching for letters to sort for balance carryover to ADL performance  Seated: -lateral step over and back 10x    The following activities were completed in parallel bars or at balance bar   Neuro RE-ed:   Static standing without UE support Dynamic lateral weight shift - Left to  right  Dyadisc under LLE weight shift 60 seconds Dynamic standing - weight shift to right and dynamic hip march; 10x; 2 sets with second set 3 second holds          PATIENT EDUCATION: Education details: POC Person educated: Patient Education method: Explanation Education comprehension: verbalized understanding  HOME EXERCISE PROGRAM: Access Code: [email or web address] URL: [email or web address] Date: 04/30/2023 Prepared by: Thresa Ross  Exercises - Standing Marching  - 1 x daily - 7 x weekly - 3 sets - 10 reps - Mini Squat with Counter Support  - 1 x daily - 7 x weekly - 3 sets - 10 reps - Single Leg Stance with Support  - 1 x daily - 7 x weekly - 3 sets - 30 sec hold - Single Leg Stance  - 1 x daily - 7 x weekly - 3 sets - 30 sec  hold  GOALS: Goals reviewed with patient? Yes  SHORT TERM GOALS: Target date: 05/23/2023  Patient will be independent in home exercise program to improve strength/mobility for better functional independence with ADLs. Baseline: No HEP currently  Goal status: INITIAL   LONG TERM GOALS: Target date: 07/18/2023   1.  Patient (> 67 years old) will complete five times sit to stand test in < 15 seconds indicating an increased LE strength and improved balance. Baseline: 21.6 sec w/ heavy UE use Goal status: INITIAL  2.  Patient will increase FOTO score to equal to or greater than  53   to demonstrate statistically significant improvement in mobility and quality of life.  Baseline: 46 Goal status: INITIAL    3.   Patient will reduce timed up and go to <11 seconds to reduce fall risk and demonstrate improved transfer/gait ability. Baseline: 34.8 sec with Rw Goal status: INITIAL  4.   Patient will increase 10 meter walk test to >.70m/s as to improve gait speed for better community ambulation and to reduce fall risk. Baseline: .395 m/s with RW Goal status: INITIAL  5.  Pt will improve to 600 ft or greater to indicate improved  community mobility and safety accessing environment independently.  Baseline: unable to ambulate this distance at eval  Goal status: INITIAL  6. .  Patient will ambulate 30 feet or greater without assistive device and without loss of balance with his prosthesis in order to indicate improved functional mobility and less reliance on assistive device. Baseline: Ambulating with rolling walker currently Goal status: INITIAL    ASSESSMENT:  CLINICAL IMPRESSION:  Patient tolerates progressive stabilization training with SPC this session. Patient tolerates use of SPC for static mobility but is not yet ready for dynamic mobility with SPC. Use of walker required for ambulation this session but may benefit from trial with cane in future session pending stability.  Patient will continue benefit from skilled physical therapy in order to increase his ambulatory capacity, improve his balance, and improve his ability to exercise and live a active lifestyle that will improve his long-term health and prognosis s/p trans femoral amputation.  OBJECTIVE IMPAIRMENTS: Abnormal gait, decreased activity tolerance, decreased balance, decreased endurance, decreased knowledge of use of DME, decreased mobility, difficulty walking, decreased strength, and hypomobility.   ACTIVITY LIMITATIONS: carrying, lifting, bending, standing, squatting, stairs, and locomotion level  PARTICIPATION LIMITATIONS: driving, shopping, community activity, yard work, and church  PERSONAL FACTORS: 1-2 comorbidities: DM, HTN, HLD   are also affecting patient's functional outcome.   REHAB POTENTIAL: Excellent  CLINICAL DECISION MAKING: Stable/uncomplicated  EVALUATION COMPLEXITY: Low  PLAN:  PT FREQUENCY: 1-2x/week  PT DURATION: 8 weeks  PLANNED INTERVENTIONS: Therapeutic exercises, Therapeutic activity, Neuromuscular re-education, Balance training, Gait training, Patient/Family education, Self Care, Joint mobilization, Stair  training, Prosthetic training, and DME instructions  PLAN FOR NEXT SESSION: progress HEP, POC with balance and mobility focus. Limited visits due to medicaid, keep track throughout.    Precious Bard, PT 05/23/2023, 3:35 PM

## 2023-05-23 ENCOUNTER — Ambulatory Visit: Payer: Medicaid Other

## 2023-05-23 DIAGNOSIS — R2689 Other abnormalities of gait and mobility: Secondary | ICD-10-CM | POA: Diagnosis not present

## 2023-05-23 DIAGNOSIS — R262 Difficulty in walking, not elsewhere classified: Secondary | ICD-10-CM | POA: Diagnosis not present

## 2023-05-23 DIAGNOSIS — R269 Unspecified abnormalities of gait and mobility: Secondary | ICD-10-CM

## 2023-05-23 DIAGNOSIS — M6281 Muscle weakness (generalized): Secondary | ICD-10-CM | POA: Diagnosis not present

## 2023-05-30 NOTE — Therapy (Signed)
OUTPATIENT PHYSICAL THERAPY NEURO TREATMENT   Patient Name: [name] MRN: [id number] DOB:12/17/1965, 57 y.o., male Today's Date: 05/31/2023   PCP:   Alba Cory, MD   REFERRING PROVIDER: Georgiana Spinner, NP   END OF SESSION:  PT End of Session - 05/31/23 1647     Visit Number 9    Number of Visits 16    Date for PT Re-Evaluation 06/22/23    Progress Note Due on Visit 10    PT Start Time 1645    PT Stop Time 1729    PT Time Calculation (min) 44 min    Equipment Utilized During Treatment Gait belt    Activity Tolerance Patient tolerated treatment well    Behavior During Therapy WFL for tasks assessed/performed                   Past Medical History:  Diagnosis Date   Diabetes mellitus without complication (HCC)    Hyperlipidemia    Hypertension    Past Surgical History:  Procedure Laterality Date   AMPUTATION Right 04/16/2021   Procedure: AMPUTATION  RIGHT 5TH RAY, INCISION AND DRAINAGE;  Surgeon: Felecia Shelling, DPM;  Location: [facility];  Service: Podiatry;  Laterality: Right;   AMPUTATION Right 11/24/2022   Procedure: AMPUTATION ABOVE KNEE;  Surgeon: Renford Dills, MD;  Location: [facility];  Service: Vascular;  Laterality: Right;   APPLICATION OF WOUND VAC Right 04/21/2021   Procedure: APPLICATION OF WOUND VAC;  Surgeon: Felecia Shelling, DPM;  Location: [facility];  Service: Podiatry;  Laterality: Right;   BONE BIOPSY Left 06/27/2020   Procedure: BONE BIOPSY;  Surgeon: Rosetta Posner, DPM;  Location: [facility];  Service: Podiatry;  Laterality: Left;   COLONOSCOPY WITH PROPOFOL N/A 06/22/2022   Procedure: COLONOSCOPY WITH PROPOFOL;  Surgeon: Wyline Mood, MD;  Location: [facility];  Service: Gastroenterology;  Laterality: N/A;   I & D EXTREMITY Right 11/22/2022   Procedure: IRRIGATION AND DEBRIDEMENT EXTREMITY, below knee amputation right leg;  Surgeon: Leafy Ro, MD;  Location: [facility];  Service: General;  Laterality: Right;   INCISION AND  DRAINAGE Left 06/27/2020   Procedure: INCISION AND DRAINAGE;  Surgeon: Rosetta Posner, DPM;  Location: [facility];  Service: Podiatry;  Laterality: Left;   IRRIGATION AND DEBRIDEMENT FOOT Right 04/21/2021   Procedure: IRRIGATION AND DEBRIDEMENT FOOT;  Surgeon: Felecia Shelling, DPM;  Location: [facility];  Service: Podiatry;  Laterality: Right;   LOWER EXTREMITY ANGIOGRAPHY Left 06/30/2020   Procedure: Lower Extremity Angiography;  Surgeon: Annice Needy, MD;  Location: [facility];  Service: Cardiovascular;  Laterality: Left;   LOWER EXTREMITY ANGIOGRAPHY Right 04/14/2021   Procedure: Lower Extremity Angiography;  Surgeon: Annice Needy, MD;  Location: [facility];  Service: Cardiovascular;  Laterality: Right;   NO PAST SURGERIES     WOUND DEBRIDEMENT Right 04/19/2021   Procedure: DEBRIDEMENT WOUND;  Surgeon: Felecia Shelling, DPM;  Location: [facility];  Service: Podiatry;  Laterality: Right;   Patient Active Problem List   Diagnosis Date Noted   History of above knee amputation, right (HCC) 12/13/2022   Elevated troponin 11/21/2022   B12 deficiency 09/05/2022   Proliferative diabetic retinopathy of both eyes without macular edema associated with type 2 diabetes mellitus (HCC) 06/05/2022   PAD (peripheral artery disease) (HCC) 04/12/2021   Secondary renal hyperparathyroidism (HCC) 03/19/2019   Dyslipidemia associated with type 2 diabetes mellitus (HCC) 07/03/2018   Vitamin D deficiency 04/11/2018  ED (erectile dysfunction) 04/10/2018   Lower urinary tract symptoms (LUTS) 02/09/2017   Hypertension 07/05/2015   Hyperglycemia due to type 2 diabetes mellitus (HCC) 07/05/2015   HLD (hyperlipidemia) 07/05/2015    ONSET DATE: 11/25/23  REFERRING DIAG: Z30.865 (ICD-10-CM) - History of above knee amputation, right (HCC)   THERAPY DIAG:  Abnormality of gait and mobility  Difficulty in walking, not elsewhere classified  Muscle weakness (generalized)  Other abnormalities of gait and  mobility  Rationale for Evaluation and Treatment: Rehabilitation  SUBJECTIVE:                                                                                                                                                                                             SUBJECTIVE STATEMENT:  Patient reports no falls or LOB since last session. Presents with a knee brace.     Pt accompanied by: significant other  PERTINENT HISTORY: per referring note [name] is a right transfemoral amputee.  At this time he does not have any comorbidities that would impact his mobility or his ability to function with his prosthetic.  His limb is well-healed and ready to be fit with his first prosthesis.  He verbally communicates a strong desire to get a prosthesis.  Currently he is wheelchair-bound but is expected to be able to ambulate with no assistive devices with a prosthesis.  Phelps is a K2 level ambulator that spends significant amount of time walking around inside and outside his home.  He will benefit from a K2 level prosthesis with a hydraulic ankle foot for improved stability.   PAIN:  Are you having pain? No  PRECAUTIONS: Fall  WEIGHT BEARING RESTRICTIONS: No  FALLS: Has patient fallen in last 6 months? No  LIVING ENVIRONMENT: Ask visit 2   PLOF: Independent  PATIENT GOALS: Walking better, mowing yard, walk around walking track, walking on uneven terrain, balancing on uneven terrain, using equipment at the [facility]   OBJECTIVE: (objective measures completed at initial evaluation unless otherwise dated)     COGNITION: Overall cognitive status: Within functional limits for tasks assessed   COORDINATION: WNL   POSTURE: posterior pelvic tilt and seated posture in personal WC  LOWER EXTREMITY ROM:      (Blank rows = not tested)  LOWER EXTREMITY MMT:  test later date if indicated, did not want to use eval time to doff / don prosthetic for proper testing   MMT Right Eval Left Eval   Hip flexion    Hip extension    Hip abduction    Hip adduction    Hip internal rotation    Hip external  rotation    Knee flexion    Knee extension    Ankle dorsiflexion    Ankle plantarflexion    Ankle inversion    Ankle eversion    (Blank rows = not tested)  BED MOBILITY:  Pt report no problems with bed mobility at this time.   TRANSFERS: Assistive device utilized: Environmental consultant - 2 wheeled  Sit to stand: Modified independence Stand to sit: Modified independence Chair to chair: Modified independence Floor:  not assessed     STAIRS: Assess at later date if indicated   GAIT: Gait pattern: step through pattern, decreased step length- Left, decreased stance time- Right, decreased stride length, and circumduction- Right Distance walked: 30 ft Assistive device utilized: Environmental consultant - 2 wheeled used [employer] for more room for [employer].  Level of assistance: Modified independence Comments: no lob noted, see above for gait pattern  FUNCTIONAL TESTS:  5 times sit to stand: 21.68 with heavy UE  Timed up and go (TUG): 34.8 sec RW 10 meter walk test: 25.28 sec / .395 m/s  PATIENT SURVEYS:  FOTO 46  TODAY'S TREATMENT:                                                                                                                              DATE: 05/31/23  Unless otherwise stated, CGA was provided and gait belt donned in order to ensure pt safety    Therex: Ambulate with SUE support in // bars 4x length of // bars Ambulate with SPC in // bars with CGA ; cues for weight shift; multiple reps cues for placement with LUE cane; step to with RLE and through with LLE ; decreasing UE support  xmultiple trials  Weighted ball (yellow) rotation 10x each side March into RTB across bars 10x each side   Neuro RE-ed:  Weight shift with focus on acceptance of RLE x2 minutes Airex pad:  -Static standing without UE support 60 seconds -eyes closed 30 seconds x 3 trials  Dynamic lateral weight  shift - Left to right  Dynamic standing - weight shift to right and dynamic hip march; 10x; 2 sets with second set 3 second holds          PATIENT EDUCATION: Education details: POC Person educated: Patient Education method: Explanation Education comprehension: verbalized understanding  HOME EXERCISE PROGRAM: Access Code: [email or web address] URL: [email or web address] Date: 04/30/2023 Prepared by: Thresa Ross  Exercises - Standing Marching  - 1 x daily - 7 x weekly - 3 sets - 10 reps - Mini Squat with Counter Support  - 1 x daily - 7 x weekly - 3 sets - 10 reps - Single Leg Stance with Support  - 1 x daily - 7 x weekly - 3 sets - 30 sec hold - Single Leg Stance  - 1 x daily - 7 x weekly - 3 sets - 30 sec  hold  GOALS: Goals reviewed with patient? Yes  SHORT TERM GOALS: Target  date: 05/23/2023     Patient will be independent in home exercise program to improve strength/mobility for better functional independence with ADLs. Baseline: No HEP currently  Goal status: INITIAL   LONG TERM GOALS: Target date: 07/18/2023   1.  Patient (> 69 years old) will complete five times sit to stand test in < 15 seconds indicating an increased LE strength and improved balance. Baseline: 21.6 sec w/ heavy UE use Goal status: INITIAL  2.  Patient will increase FOTO score to equal to or greater than  53   to demonstrate statistically significant improvement in mobility and quality of life.  Baseline: 46 Goal status: INITIAL    3.   Patient will reduce timed up and go to <11 seconds to reduce fall risk and demonstrate improved transfer/gait ability. Baseline: 34.8 sec with Rw Goal status: INITIAL  4.   Patient will increase 10 meter walk test to >.63m/s as to improve gait speed for better community ambulation and to reduce fall risk. Baseline: .395 m/s with RW Goal status: INITIAL  5.  Pt will improve to 600 ft or greater to indicate improved community mobility and  safety accessing environment independently.  Baseline: unable to ambulate this distance at eval  Goal status: INITIAL  6. .  Patient will ambulate 30 feet or greater without assistive device and without loss of balance with his prosthesis in order to indicate improved functional mobility and less reliance on assistive device. Baseline: Ambulating with rolling walker currently Goal status: INITIAL    ASSESSMENT:  CLINICAL IMPRESSION: Patient's session focused on weight shift onto prosthetic limb in combination with reduction of UE support with ambulation. Patient introduced to ambulation with SPC in // bars with cues for step to and through as well as guidance for placement of SPC. Patient is eager to progress their independent mobility. Standing with eyes closed is very challenging with extensive cueing required for midline.  Patient will continue benefit from skilled physical therapy in order to increase his ambulatory capacity, improve his balance, and improve his ability to exercise and live a active lifestyle that will improve his long-term health and prognosis s/p trans femoral amputation.  OBJECTIVE IMPAIRMENTS: Abnormal gait, decreased activity tolerance, decreased balance, decreased endurance, decreased knowledge of use of DME, decreased mobility, difficulty walking, decreased strength, and hypomobility.   ACTIVITY LIMITATIONS: carrying, lifting, bending, standing, squatting, stairs, and locomotion level  PARTICIPATION LIMITATIONS: driving, shopping, community activity, yard work, and church  PERSONAL FACTORS: 1-2 comorbidities: DM, HTN, HLD   are also affecting patient's functional outcome.   REHAB POTENTIAL: Excellent  CLINICAL DECISION MAKING: Stable/uncomplicated  EVALUATION COMPLEXITY: Low  PLAN:  PT FREQUENCY: 1-2x/week  PT DURATION: 8 weeks  PLANNED INTERVENTIONS: Therapeutic exercises, Therapeutic activity, Neuromuscular re-education, Balance training, Gait  training, Patient/Family education, Self Care, Joint mobilization, Stair training, Prosthetic training, and DME instructions  PLAN FOR NEXT SESSION: progress HEP, POC with balance and mobility focus. Limited visits due to medicaid, keep track throughout.    Precious Bard, PT 05/31/2023, 5:32 PM

## 2023-05-31 ENCOUNTER — Ambulatory Visit: Payer: Medicaid Other

## 2023-05-31 DIAGNOSIS — R269 Unspecified abnormalities of gait and mobility: Secondary | ICD-10-CM

## 2023-05-31 DIAGNOSIS — R262 Difficulty in walking, not elsewhere classified: Secondary | ICD-10-CM

## 2023-05-31 DIAGNOSIS — M6281 Muscle weakness (generalized): Secondary | ICD-10-CM | POA: Diagnosis not present

## 2023-05-31 DIAGNOSIS — R2689 Other abnormalities of gait and mobility: Secondary | ICD-10-CM | POA: Diagnosis not present

## 2023-05-31 NOTE — Progress Notes (Signed)
Name: [name]   MRN: [id number]    DOB: [birth year]   Date:06/04/2023       Progress Note  Subjective  Chief Complaint  Follow Up  HPI  Dyslipidemia: taking Crestor daily, Last LDL still not at goal but down from 162 to 79, we will recheck labs today   History of gas gangrene : s/p right AKA done 11/22/2022.  He is getting evaluated to have a prosthesis and he is looking forward to being more active again    HTN: currently taking valsartan hctz 320/25 mg daily and norvasc 10 mg, his bp at home is usually controlled 117-130's/65-75's , he states today his bp at home was 165/90 and in our office bp was very high. He states he had pork yesterday and it usually causes it to spike. I asked him to return later this week to have bp rechecked but he refused it   DMII: A1C during hospital stay was 7.3 % but today it is up to 7.9 % , he is not sure why the level is going up. He states he only drinks water, usually snacks on nuts, occasionally pretzel and popcorn, he eats once a day and sometimes has carbohydrates on his meals. He is taking Jardiance, Rybelsus and Glipizide.Marland Kitchen He states his glucose has been controlled lately, usually 116-140's. He denies polyphagia, polydipsia or polyuria. He has associated PAD, diabetic retinopathy, dyslipidemia and microalbuminuria. He is on statin therapy, ARB and aspirin daily   Patient Active Problem List   Diagnosis Date Noted   History of above knee amputation, right (HCC) 12/13/2022   Elevated troponin 11/21/2022   B12 deficiency 09/05/2022   Proliferative diabetic retinopathy of both eyes without macular edema associated with type 2 diabetes mellitus (HCC) 06/05/2022   PAD (peripheral artery disease) (HCC) 04/12/2021   Secondary renal hyperparathyroidism (HCC) 03/19/2019   Dyslipidemia associated with type 2 diabetes mellitus (HCC) 07/03/2018   Vitamin D deficiency 04/11/2018   ED (erectile dysfunction) 04/10/2018   Lower urinary tract symptoms (LUTS)  02/09/2017   Hypertension 07/05/2015   Hyperglycemia due to type 2 diabetes mellitus (HCC) 07/05/2015   HLD (hyperlipidemia) 07/05/2015    Past Surgical History:  Procedure Laterality Date   AMPUTATION Right 04/16/2021   Procedure: AMPUTATION  RIGHT 5TH RAY, INCISION AND DRAINAGE;  Surgeon: Felecia Shelling, DPM;  Location: [facility];  Service: Podiatry;  Laterality: Right;   AMPUTATION Right 11/24/2022   Procedure: AMPUTATION ABOVE KNEE;  Surgeon: Renford Dills, MD;  Location: [facility];  Service: Vascular;  Laterality: Right;   APPLICATION OF WOUND VAC Right 04/21/2021   Procedure: APPLICATION OF WOUND VAC;  Surgeon: Felecia Shelling, DPM;  Location: [facility];  Service: Podiatry;  Laterality: Right;   BONE BIOPSY Left 06/27/2020   Procedure: BONE BIOPSY;  Surgeon: Rosetta Posner, DPM;  Location: [facility];  Service: Podiatry;  Laterality: Left;   COLONOSCOPY WITH PROPOFOL N/A 06/22/2022   Procedure: COLONOSCOPY WITH PROPOFOL;  Surgeon: Wyline Mood, MD;  Location: [facility] ENDOSCOPY;  Service: Gastroenterology;  Laterality: N/A;   I & D EXTREMITY Right 11/22/2022   Procedure: IRRIGATION AND DEBRIDEMENT EXTREMITY, below knee amputation right leg;  Surgeon: Leafy Ro, MD;  Location: [facility];  Service: General;  Laterality: Right;   INCISION AND DRAINAGE Left 06/27/2020   Procedure: INCISION AND DRAINAGE;  Surgeon: Rosetta Posner, DPM;  Location: [facility];  Service: Podiatry;  Laterality: Left;   IRRIGATION AND DEBRIDEMENT FOOT Right 04/21/2021   Procedure: IRRIGATION  AND DEBRIDEMENT FOOT;  Surgeon: Felecia Shelling, DPM;  Location: [facility];  Service: Podiatry;  Laterality: Right;   LOWER EXTREMITY ANGIOGRAPHY Left 06/30/2020   Procedure: Lower Extremity Angiography;  Surgeon: Annice Needy, MD;  Location: [facility];  Service: Cardiovascular;  Laterality: Left;   LOWER EXTREMITY ANGIOGRAPHY Right 04/14/2021   Procedure: Lower Extremity Angiography;  Surgeon: Annice Needy, MD;  Location: [facility]  INVASIVE CV LAB;  Service: Cardiovascular;  Laterality: Right;   NO PAST SURGERIES     WOUND DEBRIDEMENT Right 04/19/2021   Procedure: DEBRIDEMENT WOUND;  Surgeon: Felecia Shelling, DPM;  Location: [facility];  Service: Podiatry;  Laterality: Right;    Family History  Problem Relation Age of Onset   Heart disease Father    Heart attack Father    Healthy Mother    Prostate cancer Neg Hx    Kidney cancer Neg Hx    Bladder Cancer Neg Hx     Social History   Tobacco Use   Smoking status: Never   Smokeless tobacco: Never  Substance Use Topics   Alcohol use: Yes    Alcohol/week: 6.0 - 12.0 standard drinks of alcohol    Types: 6 - 12 Cans of beer per week    Comment: Occasional     Current Outpatient Medications:    Accu-Chek Softclix Lancets lancets, USE TO TEST BLOOD SUGAR UP TO FOUR TIMES DAILY AS DIRECTED, Disp: 300 each, Rfl: 1   Cholecalciferol (VITAMIN D3) 50 MCG (2000 UT) CAPS, Take 1 capsule (2,000 Units total) by mouth daily., Disp: 90 capsule, Rfl: 1   cyanocobalamin (VITAMIN B12) 1000 MCG tablet, Take 1 tablet (1,000 mcg total) by mouth daily., Disp: 90 tablet, Rfl: 1   Iron, Ferrous Sulfate, 325 (65 Fe) MG TABS, Take 325 mg by mouth daily., Disp: 90 tablet, Rfl: 1   amLODipine (NORVASC) 10 MG tablet, Take 1 tablet (10 mg total) by mouth every evening., Disp: 90 tablet, Rfl: 1   empagliflozin (JARDIANCE) 25 MG TABS tablet, Take 1 tablet (25 mg total) by mouth daily., Disp: 90 tablet, Rfl: 1   glipiZIDE (GLUCOTROL XL) 5 MG 24 hr tablet, Take 1 tablet (5 mg total) by mouth daily with breakfast., Disp: 90 tablet, Rfl: 1   rosuvastatin (CRESTOR) 40 MG tablet, TAKE 1 TABLET(40 MG) BY MOUTH DAILY, Disp: 90 tablet, Rfl: 1   Semaglutide (RYBELSUS) 14 MG TABS, Take 1 tablet (14 mg total) by mouth daily., Disp: 90 tablet, Rfl: 1   valsartan-hydrochlorothiazide (DIOVAN-HCT) 320-25 MG tablet, Take 1 tablet by mouth daily., Disp: 90 tablet, Rfl: 1  No Known Allergies  I personally  reviewed active problem list, medication list, allergies, family history, social history, health maintenance with the patient/caregiver today.   ROS  Ten systems reviewed and is negative except as mentioned in HPI    Objective  Vitals:   06/04/23 1453  BP: 134/78  Pulse: 80  Resp: 16  SpO2: 97%  Weight: 191 lb (86.6 kg)  Height: 5\' 11"  (1.803 m)    Body mass index is 26.64 kg/m.  Physical Exam  Constitutional: Patient appears well-developed and well-nourished.  No distress.  HEENT: head atraumatic, normocephalic, pupils equal and reactive to light, neck supple Cardiovascular: Normal rate, regular rhythm and normal heart sounds.  No murmur heard. Left lower extremity edema Pulmonary/Chest: Effort normal and breath sounds normal. No respiratory distress. Abdominal: Soft.  There is no tenderness. Psychiatric: Patient has a normal mood and affect. behavior is  normal. Judgment and thought content normal.   Recent Results (from the past 2160 hour(s))  VAS [place] ABI WITH/WO TBI     Status: None   Collection Time: 04/26/23 11:26 AM  Result Value Ref Range   Right ABI Amputation    Left ABI 1.05   POCT HgB A1C     Status: Abnormal   Collection Time: 06/04/23  2:54 PM  Result Value Ref Range   Hemoglobin A1C 6.3 (A) 4.0 - 5.6 %   HbA1c POC (<> result, manual entry)     HbA1c, POC (prediabetic range)     HbA1c, POC (controlled diabetic range)      Diabetic Foot Exam: Diabetic Foot Exam - Simple   Simple Foot Form Visual Inspection See comments: Yes Sensation Testing Intact to touch and monofilament testing bilaterally: Yes Pulse Check See comments: Yes Comments Thick toenail  Weak distal pulses      PHQ2/9:    06/04/2023    2:54 PM 02/26/2023    3:08 PM 12/13/2022    3:29 PM 12/08/2022   11:14 AM 09/05/2022    3:15 PM  Depression screen PHQ 2/9  Decreased Interest 0 0 0 0 0  Down, Depressed, Hopeless 0 0 0 0 0  PHQ - 2 Score 0 0 0 0 0  Altered sleeping 0 0 0 0 0   Tired, decreased energy 0 0 0 0 0  Change in appetite 0 0 0 0 0  Feeling bad or failure about yourself  0 0 0 0 0  Trouble concentrating 0 0 0 0 0  Moving slowly or fidgety/restless 0 0 0 0 0  Suicidal thoughts 0 0 0 0 0  PHQ-9 Score 0 0 0 0 0    phq 9 is negative   Fall Risk:    06/04/2023    2:53 PM 02/26/2023    3:08 PM 12/13/2022    3:29 PM 12/08/2022   11:14 AM 09/05/2022    3:15 PM  Fall Risk   Falls in the past year? 0 0 1 1 0  Number falls in past yr: 0  1 1 0  Injury with Fall? 0  0 0 0  Risk for fall due to : Impaired mobility;Impaired balance/gait Impaired mobility Impaired balance/gait;Impaired mobility History of fall(s);Impaired mobility;Impaired balance/gait No Fall Risks  Follow up Falls prevention discussed Falls prevention discussed;Education provided;Falls evaluation completed Falls prevention discussed Falls prevention discussed Falls prevention discussed      Functional Status Survey: Is the patient deaf or have difficulty hearing?: No Does the patient have difficulty seeing, even when wearing glasses/contacts?: No Does the patient have difficulty concentrating, remembering, or making decisions?: No Does the patient have difficulty walking or climbing stairs?: Yes Does the patient have difficulty dressing or bathing?: No Does the patient have difficulty doing errands alone such as visiting a doctor's office or shopping?: Yes    Assessment & Plan  1. Dyslipidemia associated with type 2 diabetes mellitus (HCC)  - POCT HgB A1C - HM Diabetes Foot Exam - empagliflozin (JARDIANCE) 25 MG TABS tablet; Take 1 tablet (25 mg total) by mouth daily.  Dispense: 90 tablet; Refill: 1 - glipiZIDE (GLUCOTROL XL) 5 MG 24 hr tablet; Take 1 tablet (5 mg total) by mouth daily with breakfast.  Dispense: 90 tablet; Refill: 1 - rosuvastatin (CRESTOR) 40 MG tablet; TAKE 1 TABLET(40 MG) BY MOUTH DAILY  Dispense: 90 tablet; Refill: 1 - Semaglutide (RYBELSUS) 14 MG TABS; Take 1  tablet (14 mg  total) by mouth daily.  Dispense: 90 tablet; Refill: 1 - Lipid panel  2. PAD (peripheral artery disease) (HCC)  Under the care of vascular surgeon   3. S/P above knee amputation, right (HCC)  Wearing prosthesis   4. Secondary renal hyperparathyroidism (HCC)  Recheck level, last parathyroid level improved   5. Essential hypertension  - amLODipine (NORVASC) 10 MG tablet; Take 1 tablet (10 mg total) by mouth every evening.  Dispense: 90 tablet; Refill: 1 - valsartan-hydrochlorothiazide (DIOVAN-HCT) 320-25 MG tablet; Take 1 tablet by mouth daily.  Dispense: 90 tablet; Refill: 1 - COMPLETE METABOLIC PANEL WITH GFR  6. Iron deficiency  - Ferritin - CBC with Differential/Platelet  7. B12 deficiency  - CBC with Differential/Platelet  8. Vitamin D deficiency   There are no diagnoses linked to this encounter.

## 2023-06-04 ENCOUNTER — Ambulatory Visit: Payer: Medicaid Other | Admitting: Family Medicine

## 2023-06-04 ENCOUNTER — Encounter: Payer: Self-pay | Admitting: Family Medicine

## 2023-06-04 VITALS — BP 134/78 | HR 80 | Resp 16 | Ht 71.0 in | Wt 191.0 lb

## 2023-06-04 DIAGNOSIS — E611 Iron deficiency: Secondary | ICD-10-CM

## 2023-06-04 DIAGNOSIS — E538 Deficiency of other specified B group vitamins: Secondary | ICD-10-CM

## 2023-06-04 DIAGNOSIS — E785 Hyperlipidemia, unspecified: Secondary | ICD-10-CM | POA: Diagnosis not present

## 2023-06-04 DIAGNOSIS — I739 Peripheral vascular disease, unspecified: Secondary | ICD-10-CM | POA: Diagnosis not present

## 2023-06-04 DIAGNOSIS — E1169 Type 2 diabetes mellitus with other specified complication: Secondary | ICD-10-CM | POA: Diagnosis not present

## 2023-06-04 DIAGNOSIS — N2581 Secondary hyperparathyroidism of renal origin: Secondary | ICD-10-CM

## 2023-06-04 DIAGNOSIS — I10 Essential (primary) hypertension: Secondary | ICD-10-CM | POA: Diagnosis not present

## 2023-06-04 DIAGNOSIS — E559 Vitamin D deficiency, unspecified: Secondary | ICD-10-CM | POA: Diagnosis not present

## 2023-06-04 DIAGNOSIS — Z7984 Long term (current) use of oral hypoglycemic drugs: Secondary | ICD-10-CM

## 2023-06-04 DIAGNOSIS — Z89611 Acquired absence of right leg above knee: Secondary | ICD-10-CM

## 2023-06-04 LAB — POCT GLYCOSYLATED HEMOGLOBIN (HGB A1C): Hemoglobin A1C: 6.3 % — AB (ref 4.0–5.6)

## 2023-06-04 MED ORDER — GLIPIZIDE ER 5 MG PO TB24: 5.0000 mg | ORAL_TABLET | Freq: Every day | ORAL | 1 refills | Status: DC

## 2023-06-04 MED ORDER — EMPAGLIFLOZIN 25 MG PO TABS: 25.0000 mg | ORAL_TABLET | Freq: Every day | ORAL | 1 refills | Status: DC

## 2023-06-04 MED ORDER — AMLODIPINE BESYLATE 10 MG PO TABS: 10.0000 mg | ORAL_TABLET | Freq: Every evening | ORAL | 1 refills | Status: DC

## 2023-06-04 MED ORDER — RYBELSUS 14 MG PO TABS: 14.0000 mg | ORAL_TABLET | Freq: Every day | ORAL | 1 refills | Status: DC

## 2023-06-04 MED ORDER — VALSARTAN-HYDROCHLOROTHIAZIDE 320-25 MG PO TABS
1.0000 | ORAL_TABLET | Freq: Every day | ORAL | 1 refills | Status: DC
Start: 2023-06-04 — End: 2023-12-11

## 2023-06-04 MED ORDER — ROSUVASTATIN CALCIUM 40 MG PO TABS: ORAL_TABLET | ORAL | 1 refills | Status: DC

## 2023-06-05 ENCOUNTER — Telehealth (INDEPENDENT_AMBULATORY_CARE_PROVIDER_SITE_OTHER): Payer: Self-pay

## 2023-06-05 DIAGNOSIS — H2513 Age-related nuclear cataract, bilateral: Secondary | ICD-10-CM | POA: Diagnosis not present

## 2023-06-05 LAB — CBC WITH DIFFERENTIAL/PLATELET
Absolute Monocytes: 488 cells/uL (ref 200–950)
Basophils Absolute: 49 cells/uL (ref 0–200)
Basophils Relative: 0.8 %
Eosinophils Absolute: 79 cells/uL (ref 15–500)
Eosinophils Relative: 1.3 %
HCT: 47.4 % (ref 38.5–50.0)
Hemoglobin: 16 g/dL (ref 13.2–17.1)
Lymphs Abs: 1257 cells/uL (ref 850–3900)
MCH: 30.5 pg (ref 27.0–33.0)
MCHC: 33.8 g/dL (ref 32.0–36.0)
MCV: 90.3 fL (ref 80.0–100.0)
MPV: 10.1 fL (ref 7.5–12.5)
Monocytes Relative: 8 %
Neutro Abs: 4227 cells/uL (ref 1500–7800)
Neutrophils Relative %: 69.3 %
Platelets: 270 10*3/uL (ref 140–400)
RBC: 5.25 10*6/uL (ref 4.20–5.80)
RDW: 11.8 % (ref 11.0–15.0)
Total Lymphocyte: 20.6 %
WBC: 6.1 10*3/uL (ref 3.8–10.8)

## 2023-06-05 LAB — COMPLETE METABOLIC PANEL WITH GFR
AG Ratio: 1.3 (calc) (ref 1.0–2.5)
ALT: 25 U/L (ref 9–46)
AST: 19 U/L (ref 10–35)
Albumin: 3.5 g/dL — ABNORMAL LOW (ref 3.6–5.1)
Alkaline phosphatase (APISO): 70 U/L (ref 35–144)
BUN: 23 mg/dL (ref 7–25)
CO2: 25 mmol/L (ref 20–32)
Calcium: 9.5 mg/dL (ref 8.6–10.3)
Chloride: 100 mmol/L (ref 98–110)
Creat: 0.95 mg/dL (ref 0.70–1.30)
Globulin: 2.6 g/dL (calc) (ref 1.9–3.7)
Glucose, Bld: 81 mg/dL (ref 65–139)
Potassium: 3.5 mmol/L (ref 3.5–5.3)
Sodium: 141 mmol/L (ref 135–146)
Total Bilirubin: 3 mg/dL — ABNORMAL HIGH (ref 0.2–1.2)
Total Protein: 6.1 g/dL (ref 6.1–8.1)
eGFR: 94 mL/min/{1.73_m2} (ref >=60)

## 2023-06-05 LAB — LIPID PANEL
Cholesterol: 194 mg/dL (ref <200)
HDL: 76 mg/dL (ref >=40)
LDL Cholesterol (Calc): 95 mg/dL (calc)
Non-HDL Cholesterol (Calc): 118 mg/dL (calc) (ref <130)
Total CHOL/HDL Ratio: 2.6 (calc) (ref <5.0)
Triglycerides: 126 mg/dL (ref <150)

## 2023-06-05 LAB — FERRITIN: Ferritin: 78 ng/mL (ref 38–380)

## 2023-06-05 NOTE — Telephone Encounter (Signed)
Spoke with Delice Bison and let her know that I faxed Edoardo's paperwork to [employer] [facility] [employer] and placed a copy up front for pt pickup.  She states verbal understanding

## 2023-06-05 NOTE — Therapy (Signed)
OUTPATIENT PHYSICAL THERAPY NEURO TREATMENT/ Physical Therapy Progress Note   Dates of reporting period  04/27/23   to   06/06/23    Patient Name: [name] MRN: [id number] DOB:August 19, 1966, 57 y.o., male Today's Date: 06/06/2023   PCP:   Zachary Cory, MD   REFERRING PROVIDER: Georgiana Spinner, NP   END OF SESSION:  PT End of Session - 06/06/23 1518     Visit Number 10    Number of Visits 16    Date for PT Re-Evaluation 06/22/23    Progress Note Due on Visit 10    PT Start Time 1517    PT Stop Time 1559    PT Time Calculation (min) 42 min    Equipment Utilized During Treatment Gait belt    Activity Tolerance Patient tolerated treatment well    Behavior During Therapy WFL for tasks assessed/performed                    Past Medical History:  Diagnosis Date   Diabetes mellitus without complication (HCC)    Hyperlipidemia    Hypertension    Past Surgical History:  Procedure Laterality Date   AMPUTATION Right 04/16/2021   Procedure: AMPUTATION  RIGHT 5TH RAY, INCISION AND DRAINAGE;  Surgeon: [name], DPM;  Location: [facility];  Service: Podiatry;  Laterality: Right;   AMPUTATION Right 11/24/2022   Procedure: AMPUTATION ABOVE KNEE;  Surgeon: Zachary Dills, MD;  Location: [facility];  Service: Vascular;  Laterality: Right;   APPLICATION OF WOUND VAC Right 04/21/2021   Procedure: APPLICATION OF WOUND VAC;  Surgeon: [name], DPM;  Location: [facility];  Service: Podiatry;  Laterality: Right;   BONE BIOPSY Left 06/27/2020   Procedure: BONE BIOPSY;  Surgeon: [name], DPM;  Location: [facility];  Service: Podiatry;  Laterality: Left;   COLONOSCOPY WITH PROPOFOL N/A 06/22/2022   Procedure: COLONOSCOPY WITH PROPOFOL;  Surgeon: Zachary Mood, MD;  Location: [facility];  Service: Gastroenterology;  Laterality: N/A;   I & D EXTREMITY Right 11/22/2022   Procedure: IRRIGATION AND DEBRIDEMENT EXTREMITY, below knee amputation right leg;  Surgeon: Zachary Ro, MD;  Location: [facility];  Service: General;  Laterality: Right;   INCISION AND DRAINAGE Left 06/27/2020   Procedure: INCISION AND DRAINAGE;  Surgeon: [name], DPM;  Location: [facility];  Service: Podiatry;  Laterality: Left;   IRRIGATION AND DEBRIDEMENT FOOT Right 04/21/2021   Procedure: IRRIGATION AND DEBRIDEMENT FOOT;  Surgeon: [name], DPM;  Location: [facility];  Service: Podiatry;  Laterality: Right;   LOWER EXTREMITY ANGIOGRAPHY Left 06/30/2020   Procedure: Lower Extremity Angiography;  Surgeon: Zachary Needy, MD;  Location: [facility];  Service: Cardiovascular;  Laterality: Left;   LOWER EXTREMITY ANGIOGRAPHY Right 04/14/2021   Procedure: Lower Extremity Angiography;  Surgeon: Zachary Needy, MD;  Location: [facility];  Service: Cardiovascular;  Laterality: Right;   NO PAST SURGERIES     WOUND DEBRIDEMENT Right 04/19/2021   Procedure: DEBRIDEMENT WOUND;  Surgeon: [name], DPM;  Location: [facility];  Service: Podiatry;  Laterality: Right;   Patient Active Problem List   Diagnosis Date Noted   History of above knee amputation, right (HCC) 12/13/2022   Elevated troponin 11/21/2022   B12 deficiency 09/05/2022   Proliferative diabetic retinopathy of both eyes without macular edema associated with type 2 diabetes mellitus (HCC) 06/05/2022   PAD (peripheral artery disease) (HCC) 04/12/2021   Secondary renal hyperparathyroidism (  HCC) 03/19/2019   Dyslipidemia associated with type 2 diabetes mellitus (HCC) 07/03/2018   Vitamin D deficiency 04/11/2018   ED (erectile dysfunction) 04/10/2018   Lower urinary tract symptoms (LUTS) 02/09/2017   Hypertension 07/05/2015   Hyperglycemia due to type 2 diabetes mellitus (HCC) 07/05/2015   HLD (hyperlipidemia) 07/05/2015    ONSET DATE: 11/25/23  REFERRING DIAG: Z61.096 (ICD-10-CM) - History of above knee amputation, right (HCC)   THERAPY DIAG:  Abnormality of gait and mobility  Muscle weakness  (generalized)  Difficulty in walking, not elsewhere classified  Other abnormalities of gait and mobility  Rationale for Evaluation and Treatment: Rehabilitation  SUBJECTIVE:                                                                                                                                                                                             SUBJECTIVE STATEMENT: Patient reports he has been compliant with HEP, been practicing his marches without his hands     Pt accompanied by: significant other  PERTINENT HISTORY: per referring note [name] is a right transfemoral amputee.  At this time he does not have any comorbidities that would impact his mobility or his ability to function with his prosthetic.  His limb is well-healed and ready to be fit with his first prosthesis.  He verbally communicates a strong desire to get a prosthesis.  Currently he is wheelchair-bound but is expected to be able to ambulate with no assistive devices with a prosthesis.  [name] is a K2 level ambulator that spends significant amount of time walking around inside and outside his home.  He will benefit from a K2 level prosthesis with a hydraulic ankle foot for improved stability.   PAIN:  Are you having pain? No  PRECAUTIONS: Fall  WEIGHT BEARING RESTRICTIONS: No  FALLS: Has patient fallen in last 6 months? No  LIVING ENVIRONMENT: Ask visit 2   PLOF: Independent  PATIENT GOALS: Walking better, mowing yard, walk around walking track, walking on uneven terrain, balancing on uneven terrain, using equipment at the [facility]   OBJECTIVE: (objective measures completed at initial evaluation unless otherwise dated)     COGNITION: Overall cognitive status: Within functional limits for tasks assessed   COORDINATION: WNL   POSTURE: posterior pelvic tilt and seated posture in personal WC  LOWER EXTREMITY ROM:      (Blank rows = not tested)  LOWER EXTREMITY MMT:  test later date if  indicated, did not want to use eval time to doff / don prosthetic for proper testing   MMT Right Eval Left Eval  Hip flexion    Hip extension  Hip abduction    Hip adduction    Hip internal rotation    Hip external rotation    Knee flexion    Knee extension    Ankle dorsiflexion    Ankle plantarflexion    Ankle inversion    Ankle eversion    (Blank rows = not tested)  BED MOBILITY:  Pt report no problems with bed mobility at this time.   TRANSFERS: Assistive device utilized: Environmental consultant - 2 wheeled  Sit to stand: Modified independence Stand to sit: Modified independence Chair to chair: Modified independence Floor:  not assessed     STAIRS: Assess at later date if indicated   GAIT: Gait pattern: step through pattern, decreased step length- Left, decreased stance time- Right, decreased stride length, and circumduction- Right Distance walked: 30 ft Assistive device utilized: Environmental consultant - 2 wheeled used [employer] for more room for [employer].  Level of assistance: Modified independence Comments: no lob noted, see above for gait pattern  FUNCTIONAL TESTS:  5 times sit to stand: 21.68 with heavy UE  Timed up and go (TUG): 34.8 sec RW 10 meter walk test: 25.28 sec / .395 m/s  PATIENT SURVEYS:  FOTO 46  TODAY'S TREATMENT:                                                                                                                              DATE: 06/06/23  GOALS PERFORMED: SEE BELOW 6 minute walk test: performed without wheelchair follow need, without rest breaks 10 MWT: able to perform with one near LOB TUG: challenged with turns Unless otherwise stated, CGA was provided and gait belt donned in order to ensure pt safety    Therex: Ambulate with SPC in // bars with CGA ; cues for weight shift; multiple reps cues for placement with LUE cane; step to with RLE and through with LLE ; use of only cane ; no need for UE support            PATIENT EDUCATION: Education  details: POC Person educated: Patient Education method: Explanation Education comprehension: verbalized understanding  HOME EXERCISE PROGRAM: Access Code: [email or web address] URL: [email or web address] Date: 04/30/2023 Prepared by: Thresa Ross  Exercises - Standing Marching  - 1 x daily - 7 x weekly - 3 sets - 10 reps - Mini Squat with Counter Support  - 1 x daily - 7 x weekly - 3 sets - 10 reps - Single Leg Stance with Support  - 1 x daily - 7 x weekly - 3 sets - 30 sec hold - Single Leg Stance  - 1 x daily - 7 x weekly - 3 sets - 30 sec  hold  GOALS: Goals reviewed with patient? Yes  SHORT TERM GOALS: Target date: 05/23/2023     Patient will be independent in home exercise program to improve strength/mobility for better functional independence with ADLs. Baseline: No HEP currently 6/26: HEP compliant Goal status: MET   LONG TERM  GOALS: Target date: 07/18/2023   1.  Patient (> 24 years old) will complete five times sit to stand test in < 15 seconds indicating an increased LE strength and improved balance. Baseline: 21.6 sec w/ heavy UE use 6/26: 18.96 seconds with UE support  Goal status: Partially Met   2.  Patient will increase FOTO score to equal to or greater than  53   to demonstrate statistically significant improvement in mobility and quality of life.  Baseline: 46 6/26: 60% Goal status: MET    3.   Patient will reduce timed up and go to <11 seconds to reduce fall risk and demonstrate improved transfer/gait ability. Baseline: 34.8 sec with Rw 6/26:24.02 with RW Goal status: Partially Met   4.   Patient will increase 10 meter walk test to >.49m/s as to improve gait speed for better community ambulation and to reduce fall risk. Baseline: .395 m/s with RW 6/26: 0.56 m/s with RW  Goal status: Partially Met  5.  Pt will improve to 600 ft or greater to indicate improved community mobility and safety accessing environment independently.  Baseline: unable  to ambulate this distance at eval 6/26: 430 ft  Goal status: Partially Met   6. .  Patient will ambulate 30 feet or greater without assistive device and without loss of balance with his prosthesis in order to indicate improved functional mobility and less reliance on assistive device. Baseline: Ambulating with rolling walker currently 6/26:  walk in // bars with SPC 1 length Goal status: Partially Met    ASSESSMENT:  CLINICAL IMPRESSION: Patient's condition has the potential to improve in response to therapy. Maximum improvement is yet to be obtained. The anticipated improvement is attainable and reasonable in a generally predictable time.Patient is making significant progress towards functional goals at this time with excellent progression of gait speed and duration. He continues to require UE assistance for sit to stands.  Patient will continue benefit from skilled physical therapy in order to increase his ambulatory capacity, improve his balance, and improve his ability to exercise and live a active lifestyle that will improve his long-term health and prognosis s/p trans femoral amputation.  OBJECTIVE IMPAIRMENTS: Abnormal gait, decreased activity tolerance, decreased balance, decreased endurance, decreased knowledge of use of DME, decreased mobility, difficulty walking, decreased strength, and hypomobility.   ACTIVITY LIMITATIONS: carrying, lifting, bending, standing, squatting, stairs, and locomotion level  PARTICIPATION LIMITATIONS: driving, shopping, community activity, yard work, and church  PERSONAL FACTORS: 1-2 comorbidities: DM, HTN, HLD   are also affecting patient's functional outcome.   REHAB POTENTIAL: Excellent  CLINICAL DECISION MAKING: Stable/uncomplicated  EVALUATION COMPLEXITY: Low  PLAN:  PT FREQUENCY: 1-2x/week  PT DURATION: 8 weeks  PLANNED INTERVENTIONS: Therapeutic exercises, Therapeutic activity, Neuromuscular re-education, Balance training, Gait training,  Patient/Family education, Self Care, Joint mobilization, Stair training, Prosthetic training, and DME instructions  PLAN FOR NEXT SESSION: progress HEP, POC with balance and mobility focus. Limited visits due to medicaid, keep track throughout.    Precious Bard, PT 06/06/2023, 4:06 PM

## 2023-06-06 ENCOUNTER — Ambulatory Visit: Payer: Medicaid Other

## 2023-06-06 DIAGNOSIS — R269 Unspecified abnormalities of gait and mobility: Secondary | ICD-10-CM | POA: Diagnosis not present

## 2023-06-06 DIAGNOSIS — R2689 Other abnormalities of gait and mobility: Secondary | ICD-10-CM | POA: Diagnosis not present

## 2023-06-06 DIAGNOSIS — R262 Difficulty in walking, not elsewhere classified: Secondary | ICD-10-CM

## 2023-06-06 DIAGNOSIS — M6281 Muscle weakness (generalized): Secondary | ICD-10-CM | POA: Diagnosis not present

## 2023-06-07 ENCOUNTER — Telehealth: Payer: Self-pay | Admitting: Family Medicine

## 2023-06-07 NOTE — Telephone Encounter (Signed)
Medication Refill - Medication: test strips and needles.   Has the patient contacted their pharmacy? Yes.   No, refills.   (Agent: If yes, when and what did the pharmacy advise?) [id number] [place], [place] - [address]  [address] [place] [address]  Phone: [phone] Fax: [phone]  Hours: Not open 24 hours   Preferred Pharmacy (with phone number or street name):  Has the patient been seen for an appointment in the last year OR does the patient have an upcoming appointment? Yes.    Agent: Please be advised that RX refills may take up to 3 business days. We ask that you follow-up with your pharmacy.

## 2023-06-07 NOTE — Telephone Encounter (Signed)
Patient request test strips and needles. Not on current medication list.

## 2023-06-08 ENCOUNTER — Other Ambulatory Visit: Payer: Self-pay

## 2023-06-08 DIAGNOSIS — E1169 Type 2 diabetes mellitus with other specified complication: Secondary | ICD-10-CM

## 2023-06-08 MED ORDER — ACCU-CHEK SOFTCLIX LANCETS MISC
1 refills | Status: DC
Start: 2023-06-08 — End: 2024-07-30

## 2023-06-08 MED ORDER — ACCU-CHEK GUIDE VI STRP
ORAL_STRIP | 12 refills | Status: DC
Start: 2023-06-08 — End: 2023-06-26

## 2023-06-08 NOTE — Telephone Encounter (Signed)
Spoke with patient and he stated he uses [facility]. Strips [facility]ancets sent to [facility] per request.

## 2023-06-11 ENCOUNTER — Ambulatory Visit: Payer: Medicaid Other | Admitting: Physical Therapy

## 2023-06-13 ENCOUNTER — Ambulatory Visit: Payer: Medicaid Other | Attending: Nurse Practitioner | Admitting: Physical Therapy

## 2023-06-13 ENCOUNTER — Encounter: Payer: Self-pay | Admitting: Physical Therapy

## 2023-06-13 DIAGNOSIS — R262 Difficulty in walking, not elsewhere classified: Secondary | ICD-10-CM | POA: Insufficient documentation

## 2023-06-13 DIAGNOSIS — M6281 Muscle weakness (generalized): Secondary | ICD-10-CM | POA: Insufficient documentation

## 2023-06-13 DIAGNOSIS — R269 Unspecified abnormalities of gait and mobility: Secondary | ICD-10-CM | POA: Diagnosis not present

## 2023-06-13 DIAGNOSIS — R2689 Other abnormalities of gait and mobility: Secondary | ICD-10-CM | POA: Diagnosis not present

## 2023-06-13 NOTE — Therapy (Signed)
OUTPATIENT PHYSICAL THERAPY NEURO TREATMENT   Patient Name: [name] MRN: [id number] DOB:31-May-1966, 57 y.o., male Today's Date: 06/13/2023   PCP:   Zachary Cory, [name]   REFERRING PROVIDER: Georgiana Spinner, NP   END OF SESSION:  PT End of Session - 06/13/23 0928     Visit Number 11    Number of Visits 16    Date for PT Re-Evaluation 06/22/23    Progress Note Due on Visit 10    PT Start Time 0930    PT Stop Time 1014    PT Time Calculation (min) 44 min    Equipment Utilized During Treatment Gait belt    Activity Tolerance Patient tolerated treatment well    Behavior During Therapy WFL for tasks assessed/performed                     Past Medical History:  Diagnosis Date   Diabetes mellitus without complication (HCC)    Hyperlipidemia    Hypertension    Past Surgical History:  Procedure Laterality Date   AMPUTATION Right 04/16/2021   Procedure: AMPUTATION  RIGHT 5TH RAY, INCISION AND DRAINAGE;  Surgeon: [name], [name];  Location: [facility];  Service: Podiatry;  Laterality: Right;   AMPUTATION Right 11/24/2022   Procedure: AMPUTATION ABOVE KNEE;  Surgeon: Zachary Dills, [name];  Location: [facility];  Service: Vascular;  Laterality: Right;   APPLICATION OF WOUND VAC Right 04/21/2021   Procedure: APPLICATION OF WOUND VAC;  Surgeon: [name], [name];  Location: [facility];  Service: Podiatry;  Laterality: Right;   BONE BIOPSY Left 06/27/2020   Procedure: BONE BIOPSY;  Surgeon: [name], [name];  Location: [facility];  Service: Podiatry;  Laterality: Left;   COLONOSCOPY WITH PROPOFOL N/A 06/22/2022   Procedure: COLONOSCOPY WITH PROPOFOL;  Surgeon: Zachary Mood, [name];  Location: [facility];  Service: Gastroenterology;  Laterality: N/A;   I & D EXTREMITY Right 11/22/2022   Procedure: IRRIGATION AND DEBRIDEMENT EXTREMITY, below knee amputation right leg;  Surgeon: Zachary Ro, [name];  Location: [facility];  Service: General;  Laterality: Right;   INCISION AND  DRAINAGE Left 06/27/2020   Procedure: INCISION AND DRAINAGE;  Surgeon: [name], [name];  Location: [facility];  Service: Podiatry;  Laterality: Left;   IRRIGATION AND DEBRIDEMENT FOOT Right 04/21/2021   Procedure: IRRIGATION AND DEBRIDEMENT FOOT;  Surgeon: [name], [name];  Location: [facility];  Service: Podiatry;  Laterality: Right;   LOWER EXTREMITY ANGIOGRAPHY Left 06/30/2020   Procedure: Lower Extremity Angiography;  Surgeon: Zachary Needy, [name];  Location: [facility];  Service: Cardiovascular;  Laterality: Left;   LOWER EXTREMITY ANGIOGRAPHY Right 04/14/2021   Procedure: Lower Extremity Angiography;  Surgeon: Zachary Needy, [name];  Location: [facility];  Service: Cardiovascular;  Laterality: Right;   NO PAST SURGERIES     WOUND DEBRIDEMENT Right 04/19/2021   Procedure: DEBRIDEMENT WOUND;  Surgeon: [name], [name];  Location: [facility];  Service: Podiatry;  Laterality: Right;   Patient Active Problem List   Diagnosis Date Noted   History of above knee amputation, right (HCC) 12/13/2022   Elevated troponin 11/21/2022   B12 deficiency 09/05/2022   Proliferative diabetic retinopathy of both eyes without macular edema associated with type 2 diabetes mellitus (HCC) 06/05/2022   PAD (peripheral artery disease) (HCC) 04/12/2021   Secondary renal hyperparathyroidism (HCC) 03/19/2019   Dyslipidemia associated with type 2 diabetes mellitus (HCC) 07/03/2018   Vitamin D deficiency  04/11/2018   ED (erectile dysfunction) 04/10/2018   Lower urinary tract symptoms (LUTS) 02/09/2017   Hypertension 07/05/2015   Hyperglycemia due to type 2 diabetes mellitus (HCC) 07/05/2015   HLD (hyperlipidemia) 07/05/2015    ONSET DATE: 11/25/23  REFERRING DIAG: N56.213 (ICD-10-CM) - History of above knee amputation, right (HCC)   THERAPY DIAG:  Abnormality of gait and mobility  Muscle weakness (generalized)  Difficulty in walking, not elsewhere classified  Other abnormalities of gait and  mobility  Rationale for Evaluation and Treatment: Rehabilitation  SUBJECTIVE:                                                                                                                                                                                             SUBJECTIVE STATEMENT: Patient reports doing well, his HEP is going well at home. No significant changes since last session.    Pt accompanied by: significant other  PERTINENT HISTORY: per referring note [name] is a right transfemoral amputee.  At this time he does not have any comorbidities that would impact his mobility or his ability to function with his prosthetic.  His limb is well-healed and ready to be fit with his first prosthesis.  He verbally communicates a strong desire to get a prosthesis.  Currently he is wheelchair-bound but is expected to be able to ambulate with no assistive devices with a prosthesis.  [name] is a K2 level ambulator that spends significant amount of time walking around inside and outside his home.  He will benefit from a K2 level prosthesis with a hydraulic ankle foot for improved stability.   PAIN:  Are you having pain? No  PRECAUTIONS: Fall  WEIGHT BEARING RESTRICTIONS: No  FALLS: Has patient fallen in last 6 months? No  LIVING ENVIRONMENT: Ask visit 2   PLOF: Independent  PATIENT GOALS: Walking better, mowing yard, walk around walking track, walking on uneven terrain, balancing on uneven terrain, using equipment at the [facility]   OBJECTIVE: (objective measures completed at initial evaluation unless otherwise dated)     COGNITION: Overall cognitive status: Within functional limits for tasks assessed   COORDINATION: WNL   POSTURE: posterior pelvic tilt and seated posture in personal WC  LOWER EXTREMITY ROM:      (Blank rows = not tested)  LOWER EXTREMITY MMT:  test later date if indicated, did not want to use eval time to doff / don prosthetic for proper testing   MMT  Right Eval Left Eval  Hip flexion    Hip extension    Hip abduction    Hip adduction    Hip internal rotation  Hip external rotation    Knee flexion    Knee extension    Ankle dorsiflexion    Ankle plantarflexion    Ankle inversion    Ankle eversion    (Blank rows = not tested)  BED MOBILITY:  Pt report no problems with bed mobility at this time.   TRANSFERS: Assistive device utilized: Environmental consultant - 2 wheeled  Sit to stand: Modified independence Stand to sit: Modified independence Chair to chair: Modified independence Floor:  not assessed     STAIRS: Assess at later date if indicated   GAIT: Gait pattern: step through pattern, decreased step length- Left, decreased stance time- Right, decreased stride length, and circumduction- Right Distance walked: 30 ft Assistive device utilized: Environmental consultant - 2 wheeled used [employer] for more room for [employer].  Level of assistance: Modified independence Comments: no lob noted, see above for gait pattern  FUNCTIONAL TESTS:  5 times sit to stand: 21.68 with heavy UE  Timed up and go (TUG): 34.8 sec RW 10 meter walk test: 25.28 sec / .395 m/s  PATIENT SURVEYS:  FOTO 46  TODAY'S TREATMENT:                                                                                                                              DATE: 06/13/23 Unless otherwise stated, CGA was provided and gait belt donned in order to ensure pt safety  Today's exercises preformed at or in // bars  Therex:  Ambulate with SUE support 10x length of // bars -progressed from SUE focusing on weight shift and increasing the amount of time pt has LLE in the air, and stepping through past RLE. Pt responded well after additional 2-3 laps with specific cueing. Transitioned into: -Ambulating with SPC in L arm 8x length of // bars with good carryover from // bar support. By end of session pt achieving a slight step through pattern using SPC. Intermittent cues still req for proper  sequencing with cane.   Neuro RE-ed:  Weight shift with hold on RLE for 2 x 1 min Standing marches x 10 ea side, pt reports it's difficult to place the prosthesis in correct positioning   Airex pad:  -Static standing without UE support 60 seconds -eyes closed 30 seconds x 3 trials  -Dynamic lateral weight shift - Left to right  -Dynamic hip march; x 10 ea side   PATIENT EDUCATION: Education details: POC Person educated: Patient Education method: Explanation Education comprehension: verbalized understanding  HOME EXERCISE PROGRAM: Access Code: [email or web address] URL: [email or web address] Date: 04/30/2023 Prepared by: Thresa Ross  Exercises - Standing Marching  - 1 x daily - 7 x weekly - 3 sets - 10 reps - Mini Squat with Counter Support  - 1 x daily - 7 x weekly - 3 sets - 10 reps - Single Leg Stance with Support  - 1 x daily - 7 x weekly - 3 sets - 30 sec hold - Single Leg  Stance  - 1 x daily - 7 x weekly - 3 sets - 30 sec  hold  GOALS: Goals reviewed with patient? Yes  SHORT TERM GOALS: Target date: 05/23/2023     Patient will be independent in home exercise program to improve strength/mobility for better functional independence with ADLs. Baseline: No HEP currently 6/26: HEP compliant Goal status: MET   LONG TERM GOALS: Target date: 07/18/2023   1.  Patient (> 6 years old) will complete five times sit to stand test in < 15 seconds indicating an increased LE strength and improved balance. Baseline: 21.6 sec w/ heavy UE use 6/26: 18.96 seconds with UE support  Goal status: Partially Met   2.  Patient will increase FOTO score to equal to or greater than  53   to demonstrate statistically significant improvement in mobility and quality of life.  Baseline: 46 6/26: 60% Goal status: MET    3.   Patient will reduce timed up and go to <11 seconds to reduce fall risk and demonstrate improved transfer/gait ability. Baseline: 34.8 sec with Rw 6/26:24.02 with  RW Goal status: Partially Met   4.   Patient will increase 10 meter walk test to >.35m/s as to improve gait speed for better community ambulation and to reduce fall risk. Baseline: .395 m/s with RW 6/26: 0.56 m/s with RW  Goal status: Partially Met  5.  Pt will improve to 600 ft or greater to indicate improved community mobility and safety accessing environment independently.  Baseline: unable to ambulate this distance at eval 6/26: 430 ft  Goal status: Partially Met   6. .  Patient will ambulate 30 feet or greater without assistive device and without loss of balance with his prosthesis in order to indicate improved functional mobility and less reliance on assistive device. Baseline: Ambulating with rolling walker currently 6/26:  walk in // bars with SPC 1 length Goal status: Partially Met    ASSESSMENT:  CLINICAL IMPRESSION: Patient appeared motivated and ready for treatment on this day. Today's session focused on continuation of parallel bar ambulation , utilizing a gradual progression of L arm support on parallel bars to a L arm support with SPC. Needs cues for proper sequencing throughout [facility] use. Continuing to respond well to static and dynamic balance as well. Pt responding well to high repetition training Pt will continue to benefit from skilled physical therapy intervention to address impairments, improve QOL, and attain therapy goals.   OBJECTIVE IMPAIRMENTS: Abnormal gait, decreased activity tolerance, decreased balance, decreased endurance, decreased knowledge of use of DME, decreased mobility, difficulty walking, decreased strength, and hypomobility.   ACTIVITY LIMITATIONS: carrying, lifting, bending, standing, squatting, stairs, and locomotion level  PARTICIPATION LIMITATIONS: driving, shopping, community activity, yard work, and church  PERSONAL FACTORS: 1-2 comorbidities: DM, HTN, HLD   are also affecting patient's functional outcome.   REHAB POTENTIAL:  Excellent  CLINICAL DECISION MAKING: Stable/uncomplicated  EVALUATION COMPLEXITY: Low  PLAN:  PT FREQUENCY: 1-2x/week  PT DURATION: 8 weeks  PLANNED INTERVENTIONS: Therapeutic exercises, Therapeutic activity, Neuromuscular re-education, Balance training, Gait training, Patient/Family education, Self Care, Joint mobilization, Stair training, Prosthetic training, and DME instructions  PLAN FOR NEXT SESSION: progress HEP, POC with balance and mobility focus. Limited visits due to medicaid, keep track throughout.   This entire session was performed under direct supervision and direction of a licensed therapist/therapist assistant . I have personally read, edited and approve of the note as written.    This licensed clinician was present  and actively directing care throughout the session at all times.  Norman Herrlich PT ,DPT Physical Therapist- [facility]    [place], [place] 06/13/2023, 11:15 AM

## 2023-06-18 NOTE — Therapy (Signed)
OUTPATIENT PHYSICAL THERAPY NEURO TREATMENT   Patient Name: [name] MRN: [id number] DOB:February 12, 1966, 57 y.o., male Today's Date: 06/19/2023   PCP:   Zachary Cory, MD   REFERRING PROVIDER: Georgiana Spinner, NP   END OF SESSION:  PT End of Session - 06/19/23 1059     Visit Number 12    Number of Visits 16    Date for PT Re-Evaluation 06/22/23    Progress Note Due on Visit 10    PT Start Time 1100    PT Stop Time 1143    PT Time Calculation (min) 43 min    Equipment Utilized During Treatment Gait belt    Activity Tolerance Patient tolerated treatment well    Behavior During Therapy WFL for tasks assessed/performed                      Past Medical History:  Diagnosis Date   Diabetes mellitus without complication (HCC)    Hyperlipidemia    Hypertension    Past Surgical History:  Procedure Laterality Date   AMPUTATION Right 04/16/2021   Procedure: AMPUTATION  RIGHT 5TH RAY, INCISION AND DRAINAGE;  Surgeon: [name], DPM;  Location: [facility];  Service: Podiatry;  Laterality: Right;   AMPUTATION Right 11/24/2022   Procedure: AMPUTATION ABOVE KNEE;  Surgeon: Zachary Dills, MD;  Location: [facility];  Service: Vascular;  Laterality: Right;   APPLICATION OF WOUND VAC Right 04/21/2021   Procedure: APPLICATION OF WOUND VAC;  Surgeon: [name], DPM;  Location: [facility];  Service: Podiatry;  Laterality: Right;   BONE BIOPSY Left 06/27/2020   Procedure: BONE BIOPSY;  Surgeon: [name], DPM;  Location: [facility];  Service: Podiatry;  Laterality: Left;   COLONOSCOPY WITH PROPOFOL N/A 06/22/2022   Procedure: COLONOSCOPY WITH PROPOFOL;  Surgeon: Zachary Mood, MD;  Location: [facility]OS[facility];  Service: Gastroenterology;  Laterality: N/A;   I & D EXTREMITY Right 11/22/2022   Procedure: IRRIGATION AND DEBRIDEMENT EXTREMITY, below knee amputation right leg;  Surgeon: Zachary Ro, MD;  Location: [facility];  Service: General;  Laterality: Right;   INCISION AND  DRAINAGE Left 06/27/2020   Procedure: INCISION AND DRAINAGE;  Surgeon: [name], DPM;  Location: [facility];  Service: Podiatry;  Laterality: Left;   IRRIGATION AND DEBRIDEMENT FOOT Right 04/21/2021   Procedure: IRRIGATION AND DEBRIDEMENT FOOT;  Surgeon: [name], DPM;  Location: [facility];  Service: Podiatry;  Laterality: Right;   LOWER EXTREMITY ANGIOGRAPHY Left 06/30/2020   Procedure: Lower Extremity Angiography;  Surgeon: Zachary Needy, MD;  Location: [facility];  Service: Cardiovascular;  Laterality: Left;   LOWER EXTREMITY ANGIOGRAPHY Right 04/14/2021   Procedure: Lower Extremity Angiography;  Surgeon: Zachary Needy, MD;  Location: [facility];  Service: Cardiovascular;  Laterality: Right;   NO PAST SURGERIES     WOUND DEBRIDEMENT Right 04/19/2021   Procedure: DEBRIDEMENT WOUND;  Surgeon: [name], DPM;  Location: [facility];  Service: Podiatry;  Laterality: Right;   Patient Active Problem List   Diagnosis Date Noted   History of above knee amputation, right (HCC) 12/13/2022   Elevated troponin 11/21/2022   B12 deficiency 09/05/2022   Proliferative diabetic retinopathy of both eyes without macular edema associated with type 2 diabetes mellitus (HCC) 06/05/2022   PAD (peripheral artery disease) (HCC) 04/12/2021   Secondary renal hyperparathyroidism (HCC) 03/19/2019   Dyslipidemia associated with type 2 diabetes mellitus (HCC) 07/03/2018   Vitamin D  deficiency 04/11/2018   ED (erectile dysfunction) 04/10/2018   Lower urinary tract symptoms (LUTS) 02/09/2017   Hypertension 07/05/2015   Hyperglycemia due to type 2 diabetes mellitus (HCC) 07/05/2015   HLD (hyperlipidemia) 07/05/2015    ONSET DATE: 11/25/23  REFERRING DIAG: Z61.096 (ICD-10-CM) - History of above knee amputation, right (HCC)   THERAPY DIAG:  Abnormality of gait and mobility  Muscle weakness (generalized)  Difficulty in walking, not elsewhere classified  Other abnormalities of gait and  mobility  Rationale for Evaluation and Treatment: Rehabilitation  SUBJECTIVE:                                                                                                                                                                                             SUBJECTIVE STATEMENT: Patient presents after a good weekend, reports he has been compliant with HEP. Did have a fall last week but was able to get himself back up.     Pt accompanied by: significant other  PERTINENT HISTORY: per referring note [name] is a right transfemoral amputee.  At this time he does not have any comorbidities that would impact his mobility or his ability to function with his prosthetic.  His limb is well-healed and ready to be fit with his first prosthesis.  He verbally communicates a strong desire to get a prosthesis.  Currently he is wheelchair-bound but is expected to be able to ambulate with no assistive devices with a prosthesis.  [name] is a K2 level ambulator that spends significant amount of time walking around inside and outside his home.  He will benefit from a K2 level prosthesis with a hydraulic ankle foot for improved stability.   PAIN:  Are you having pain? No  PRECAUTIONS: Fall  WEIGHT BEARING RESTRICTIONS: No  FALLS: Has patient fallen in last 6 months? No  LIVING ENVIRONMENT: Ask visit 2   PLOF: Independent  PATIENT GOALS: Walking better, mowing yard, walk around walking track, walking on uneven terrain, balancing on uneven terrain, using equipment at the [facility]   OBJECTIVE: (objective measures completed at initial evaluation unless otherwise dated)     COGNITION: Overall cognitive status: Within functional limits for tasks assessed   COORDINATION: WNL   POSTURE: posterior pelvic tilt and seated posture in personal WC  LOWER EXTREMITY ROM:      (Blank rows = not tested)  LOWER EXTREMITY MMT:  test later date if indicated, did not want to use eval time to doff / don  prosthetic for proper testing   MMT Right Eval Left Eval  Hip flexion    Hip extension    Hip abduction  Hip adduction    Hip internal rotation    Hip external rotation    Knee flexion    Knee extension    Ankle dorsiflexion    Ankle plantarflexion    Ankle inversion    Ankle eversion    (Blank rows = not tested)  BED MOBILITY:  Pt report no problems with bed mobility at this time.   TRANSFERS: Assistive device utilized: Environmental consultant - 2 wheeled  Sit to stand: Modified independence Stand to sit: Modified independence Chair to chair: Modified independence Floor:  not assessed     STAIRS: Assess at later date if indicated   GAIT: Gait pattern: step through pattern, decreased step length- Left, decreased stance time- Right, decreased stride length, and circumduction- Right Distance walked: 30 ft Assistive device utilized: Environmental consultant - 2 wheeled used [employer] for more room for [employer].  Level of assistance: Modified independence Comments: no lob noted, see above for gait pattern  FUNCTIONAL TESTS:  5 times sit to stand: 21.68 with heavy UE  Timed up and go (TUG): 34.8 sec RW 10 meter walk test: 25.28 sec / .395 m/s  PATIENT SURVEYS:  FOTO 46  TODAY'S TREATMENT:                                                                                                                              DATE: 06/19/23 Unless otherwise stated, CGA was provided and gait belt donned in order to ensure pt safety  Today's exercises preformed at or in // bars  Therex:  Ambulate with SUE support 4x length of // bars  -Ambulating with SPC in L arm 8x length of // bars with good carryover from // bar support. By end of session pt achieving a slight step through pattern using SPC. Intermittent cues still req for proper sequencing with cane.; x2 sets of 8x   -forward/backwards ambulate with SPC ; backwards with finger tip support and cues for gluteal activation 6x length of // bars    Lateral step  with finger tip support 10x   Neuro RE-ed:  L foot on soccer ball 60 seconds x 2 trials Airex pad: balloon taps for coordination and spatial awareness with reactions and weight shift onto prosthetic limb x 4 minutes SPC march in place 10x    PATIENT EDUCATION: Education details: POC Person educated: Patient Education method: Explanation Education comprehension: verbalized understanding  HOME EXERCISE PROGRAM: Access Code: [email or web address] URL: [email or web address] Date: 04/30/2023 Prepared by: Thresa Ross  Exercises - Standing Marching  - 1 x daily - 7 x weekly - 3 sets - 10 reps - Mini Squat with Counter Support  - 1 x daily - 7 x weekly - 3 sets - 10 reps - Single Leg Stance with Support  - 1 x daily - 7 x weekly - 3 sets - 30 sec hold - Single Leg Stance  - 1 x daily - 7 x weekly - 3 sets - 30 sec  hold  GOALS: Goals reviewed with patient? Yes  SHORT TERM GOALS: Target date: 05/23/2023     Patient will be independent in home exercise program to improve strength/mobility for better functional independence with ADLs. Baseline: No HEP currently 6/26: HEP compliant Goal status: MET   LONG TERM GOALS: Target date: 07/18/2023   1.  Patient (> 72 years old) will complete five times sit to stand test in < 15 seconds indicating an increased LE strength and improved balance. Baseline: 21.6 sec w/ heavy UE use 6/26: 18.96 seconds with UE support  Goal status: Partially Met   2.  Patient will increase FOTO score to equal to or greater than  53   to demonstrate statistically significant improvement in mobility and quality of life.  Baseline: 46 6/26: 60% Goal status: MET    3.   Patient will reduce timed up and go to <11 seconds to reduce fall risk and demonstrate improved transfer/gait ability. Baseline: 34.8 sec with Rw 6/26:24.02 with RW Goal status: Partially Met   4.   Patient will increase 10 meter walk test to >.39m/s as to improve gait speed for better  community ambulation and to reduce fall risk. Baseline: .395 m/s with RW 6/26: 0.56 m/s with RW  Goal status: Partially Met  5.  Pt will improve to 600 ft or greater to indicate improved community mobility and safety accessing environment independently.  Baseline: unable to ambulate this distance at eval 6/26: 430 ft  Goal status: Partially Met   6. .  Patient will ambulate 30 feet or greater without assistive device and without loss of balance with his prosthesis in order to indicate improved functional mobility and less reliance on assistive device. Baseline: Ambulating with rolling walker currently 6/26:  walk in // bars with SPC 1 length Goal status: Partially Met    ASSESSMENT:  CLINICAL IMPRESSION: Patient is able to tolerate progressive stabilization and cane training interventions. He did have a fall so fall reduction training performed with patient and his wife. Patient is eager to progress his functional stability and mobility with decreased need for cues for sequencing with cane performed today.  Pt will continue to benefit from skilled physical therapy intervention to address impairments, improve QOL, and attain therapy goals.   OBJECTIVE IMPAIRMENTS: Abnormal gait, decreased activity tolerance, decreased balance, decreased endurance, decreased knowledge of use of DME, decreased mobility, difficulty walking, decreased strength, and hypomobility.   ACTIVITY LIMITATIONS: carrying, lifting, bending, standing, squatting, stairs, and locomotion level  PARTICIPATION LIMITATIONS: driving, shopping, community activity, yard work, and church  PERSONAL FACTORS: 1-2 comorbidities: DM, HTN, HLD   are also affecting patient's functional outcome.   REHAB POTENTIAL: Excellent  CLINICAL DECISION MAKING: Stable/uncomplicated  EVALUATION COMPLEXITY: Low  PLAN:  PT FREQUENCY: 1-2x/week  PT DURATION: 8 weeks  PLANNED INTERVENTIONS: Therapeutic exercises, Therapeutic activity,  Neuromuscular re-education, Balance training, Gait training, Patient/Family education, Self Care, Joint mobilization, Stair training, Prosthetic training, and DME instructions  PLAN FOR NEXT SESSION: progress HEP, POC with balance and mobility focus. Limited visits due to medicaid, keep track throughout.      Precious Bard, PT 06/19/2023, 11:49 AM

## 2023-06-19 ENCOUNTER — Ambulatory Visit: Payer: Medicaid Other

## 2023-06-19 DIAGNOSIS — M6281 Muscle weakness (generalized): Secondary | ICD-10-CM | POA: Diagnosis not present

## 2023-06-19 DIAGNOSIS — R2689 Other abnormalities of gait and mobility: Secondary | ICD-10-CM

## 2023-06-19 DIAGNOSIS — R262 Difficulty in walking, not elsewhere classified: Secondary | ICD-10-CM

## 2023-06-19 DIAGNOSIS — R269 Unspecified abnormalities of gait and mobility: Secondary | ICD-10-CM | POA: Diagnosis not present

## 2023-06-20 ENCOUNTER — Telehealth: Payer: Self-pay | Admitting: Family Medicine

## 2023-06-20 ENCOUNTER — Other Ambulatory Visit: Payer: Self-pay

## 2023-06-20 DIAGNOSIS — Z89611 Acquired absence of right leg above knee: Secondary | ICD-10-CM

## 2023-06-20 NOTE — Telephone Encounter (Signed)
Pt is calling in requesting a prescription for an Adjustable Walking Cane be sent over to [facility] on [address] in [place]. Please advise.

## 2023-06-20 NOTE — Therapy (Signed)
OUTPATIENT PHYSICAL THERAPY NEURO TREATMENT   Patient Name: [name] MRN: [id number] DOB:1966/01/09, 57 y.o., male Today's Date: 06/21/2023   PCP:   Zachary Cory, MD   REFERRING PROVIDER: Georgiana Spinner, NP   END OF SESSION:  PT End of Session - 06/21/23 1446     Visit Number 13    Number of Visits 16    Date for PT Re-Evaluation 06/22/23    Progress Note Due on Visit 10    PT Start Time 1446    PT Stop Time 1529    PT Time Calculation (min) 43 min    Equipment Utilized During Treatment Gait belt    Activity Tolerance Patient tolerated treatment well    Behavior During Therapy WFL for tasks assessed/performed                       Past Medical History:  Diagnosis Date   Diabetes mellitus without complication (HCC)    Hyperlipidemia    Hypertension    Past Surgical History:  Procedure Laterality Date   AMPUTATION Right 04/16/2021   Procedure: AMPUTATION  RIGHT 5TH RAY, INCISION AND DRAINAGE;  Surgeon: [name], DPM;  Location: [facility];  Service: Podiatry;  Laterality: Right;   AMPUTATION Right 11/24/2022   Procedure: AMPUTATION ABOVE KNEE;  Surgeon: Zachary Dills, MD;  Location: [facility];  Service: Vascular;  Laterality: Right;   APPLICATION OF WOUND VAC Right 04/21/2021   Procedure: APPLICATION OF WOUND VAC;  Surgeon: [name], DPM;  Location: [facility];  Service: Podiatry;  Laterality: Right;   BONE BIOPSY Left 06/27/2020   Procedure: BONE BIOPSY;  Surgeon: [name], DPM;  Location: [facility];  Service: Podiatry;  Laterality: Left;   COLONOSCOPY WITH PROPOFOL N/A 06/22/2022   Procedure: COLONOSCOPY WITH PROPOFOL;  Surgeon: Zachary Mood, MD;  Location: [facility] ENDOSCOPY;  Service: Gastroenterology;  Laterality: N/A;   I & D EXTREMITY Right 11/22/2022   Procedure: IRRIGATION AND DEBRIDEMENT EXTREMITY, below knee amputation right leg;  Surgeon: Zachary Ro, MD;  Location: [facility];  Service: General;  Laterality: Right;   INCISION  AND DRAINAGE Left 06/27/2020   Procedure: INCISION AND DRAINAGE;  Surgeon: [name], DPM;  Location: [facility];  Service: Podiatry;  Laterality: Left;   IRRIGATION AND DEBRIDEMENT FOOT Right 04/21/2021   Procedure: IRRIGATION AND DEBRIDEMENT FOOT;  Surgeon: [name], DPM;  Location: [facility];  Service: Podiatry;  Laterality: Right;   LOWER EXTREMITY ANGIOGRAPHY Left 06/30/2020   Procedure: Lower Extremity Angiography;  Surgeon: Zachary Needy, MD;  Location: [facility];  Service: Cardiovascular;  Laterality: Left;   LOWER EXTREMITY ANGIOGRAPHY Right 04/14/2021   Procedure: Lower Extremity Angiography;  Surgeon: Zachary Needy, MD;  Location: [facility];  Service: Cardiovascular;  Laterality: Right;   NO PAST SURGERIES     WOUND DEBRIDEMENT Right 04/19/2021   Procedure: DEBRIDEMENT WOUND;  Surgeon: [name], DPM;  Location: [facility];  Service: Podiatry;  Laterality: Right;   Patient Active Problem List   Diagnosis Date Noted   History of above knee amputation, right (HCC) 12/13/2022   Elevated troponin 11/21/2022   B12 deficiency 09/05/2022   Proliferative diabetic retinopathy of both eyes without macular edema associated with type 2 diabetes mellitus (HCC) 06/05/2022   PAD (peripheral artery disease) (HCC) 04/12/2021   Secondary renal hyperparathyroidism (HCC) 03/19/2019   Dyslipidemia associated with type 2 diabetes mellitus (HCC) 07/03/2018   Vitamin  D deficiency 04/11/2018   ED (erectile dysfunction) 04/10/2018   Lower urinary tract symptoms (LUTS) 02/09/2017   Hypertension 07/05/2015   Hyperglycemia due to type 2 diabetes mellitus (HCC) 07/05/2015   HLD (hyperlipidemia) 07/05/2015    ONSET DATE: 11/25/23  REFERRING DIAG: Z61.096 (ICD-10-CM) - History of above knee amputation, right (HCC)   THERAPY DIAG:  Abnormality of gait and mobility  Muscle weakness (generalized)  Difficulty in walking, not elsewhere classified  Other abnormalities of gait and  mobility  Rationale for Evaluation and Treatment: Rehabilitation  SUBJECTIVE:                                                                                                                                                                                             SUBJECTIVE STATEMENT: Patient reports no falls or LOB, requested a cane referral from MD     Pt accompanied by: significant other  PERTINENT HISTORY: per referring note [name] is a right transfemoral amputee.  At this time he does not have any comorbidities that would impact his mobility or his ability to function with his prosthetic.  His limb is well-healed and ready to be fit with his first prosthesis.  He verbally communicates a strong desire to get a prosthesis.  Currently he is wheelchair-bound but is expected to be able to ambulate with no assistive devices with a prosthesis.  [name] is a K2 level ambulator that spends significant amount of time walking around inside and outside his home.  He will benefit from a K2 level prosthesis with a hydraulic ankle foot for improved stability.   PAIN:  Are you having pain? No  PRECAUTIONS: Fall  WEIGHT BEARING RESTRICTIONS: No  FALLS: Has patient fallen in last 6 months? No  LIVING ENVIRONMENT: Ask visit 2   PLOF: Independent  PATIENT GOALS: Walking better, mowing yard, walk around walking track, walking on uneven terrain, balancing on uneven terrain, using equipment at the [facility]   OBJECTIVE: (objective measures completed at initial evaluation unless otherwise dated)     COGNITION: Overall cognitive status: Within functional limits for tasks assessed   COORDINATION: WNL   POSTURE: posterior pelvic tilt and seated posture in personal WC  LOWER EXTREMITY ROM:      (Blank rows = not tested)  LOWER EXTREMITY MMT:  test later date if indicated, did not want to use eval time to doff / don prosthetic for proper testing   MMT Right Eval Left Eval  Hip flexion     Hip extension    Hip abduction    Hip adduction    Hip internal rotation  Hip external rotation    Knee flexion    Knee extension    Ankle dorsiflexion    Ankle plantarflexion    Ankle inversion    Ankle eversion    (Blank rows = not tested)  BED MOBILITY:  Pt report no problems with bed mobility at this time.   TRANSFERS: Assistive device utilized: Environmental consultant - 2 wheeled  Sit to stand: Modified independence Stand to sit: Modified independence Chair to chair: Modified independence Floor:  not assessed     STAIRS: Assess at later date if indicated   GAIT: Gait pattern: step through pattern, decreased step length- Left, decreased stance time- Right, decreased stride length, and circumduction- Right Distance walked: 30 ft Assistive device utilized: Environmental consultant - 2 wheeled used [employer] for more room for [employer].  Level of assistance: Modified independence Comments: no lob noted, see above for gait pattern  FUNCTIONAL TESTS:  5 times sit to stand: 21.68 with heavy UE  Timed up and go (TUG): 34.8 sec RW 10 meter walk test: 25.28 sec / .395 m/s  PATIENT SURVEYS:  FOTO 46  TODAY'S TREATMENT:                                                                                                                              DATE: 06/21/23 Unless otherwise stated, CGA was provided and gait belt donned in order to ensure pt safety  Today's exercises preformed at or in // bars  Therex:  Ambulate with SUE support 4x length of // bars  -Ambulating with SPC in L arm 8x length of // bars with good carryover from // bar support. By end of session pt achieving a slight step through pattern using SPC. Intermittent cues still req for proper sequencing with cane.; x2 sets of 8x   -ambulate with SPC in circles around // bars inside and outside // bar able to perform outside // bars for first time this session; multiple rounds   -ambulate without surface nearby with SPC one length of gym to other  side    Lateral step with finger tip support to no UE support 6x length of // bars   Neuro RE-ed:  Single limb stance with finger tip supports 5x20-30 seconds with stabilization cues Ambulate with 15lb KB in RUE; LUE support in // bars x 4 x length of // bars    PATIENT EDUCATION: Education details: POC Person educated: Patient Education method: Explanation Education comprehension: verbalized understanding  HOME EXERCISE PROGRAM: Access Code: [email or web address] URL: [email or web address] Date: 04/30/2023 Prepared by: Thresa Ross  Exercises - Standing Marching  - 1 x daily - 7 x weekly - 3 sets - 10 reps - Mini Squat with Counter Support  - 1 x daily - 7 x weekly - 3 sets - 10 reps - Single Leg Stance with Support  - 1 x daily - 7 x weekly - 3 sets - 30 sec hold - Single Leg Stance  -  1 x daily - 7 x weekly - 3 sets - 30 sec  hold  GOALS: Goals reviewed with patient? Yes  SHORT TERM GOALS: Target date: 05/23/2023     Patient will be independent in home exercise program to improve strength/mobility for better functional independence with ADLs. Baseline: No HEP currently 6/26: HEP compliant Goal status: MET   LONG TERM GOALS: Target date: 07/18/2023   1.  Patient (> 68 years old) will complete five times sit to stand test in < 15 seconds indicating an increased LE strength and improved balance. Baseline: 21.6 sec w/ heavy UE use 6/26: 18.96 seconds with UE support  Goal status: Partially Met   2.  Patient will increase FOTO score to equal to or greater than  53   to demonstrate statistically significant improvement in mobility and quality of life.  Baseline: 46 6/26: 60% Goal status: MET    3.   Patient will reduce timed up and go to <11 seconds to reduce fall risk and demonstrate improved transfer/gait ability. Baseline: 34.8 sec with Rw 6/26:24.02 with RW Goal status: Partially Met   4.   Patient will increase 10 meter walk test to >.24m/s as to improve  gait speed for better community ambulation and to reduce fall risk. Baseline: .395 m/s with RW 6/26: 0.56 m/s with RW  Goal status: Partially Met  5.  Pt will improve to 600 ft or greater to indicate improved community mobility and safety accessing environment independently.  Baseline: unable to ambulate this distance at eval 6/26: 430 ft  Goal status: Partially Met   6. .  Patient will ambulate 30 feet or greater without assistive device and without loss of balance with his prosthesis in order to indicate improved functional mobility and less reliance on assistive device. Baseline: Ambulating with rolling walker currently 6/26:  walk in // bars with SPC 1 length Goal status: Partially Met    ASSESSMENT:  CLINICAL IMPRESSION:  Patient able to ambulate with SPC out of // bars for first time this session. Patient able to perform lateral  stepping without UE support this session. Patient is highly motivated throughout session and eager to progress his functional mobility. He has significant improvement of weight shift throughout session. Pt will continue to benefit from skilled physical therapy intervention to address impairments, improve QOL, and attain therapy goals.   OBJECTIVE IMPAIRMENTS: Abnormal gait, decreased activity tolerance, decreased balance, decreased endurance, decreased knowledge of use of DME, decreased mobility, difficulty walking, decreased strength, and hypomobility.   ACTIVITY LIMITATIONS: carrying, lifting, bending, standing, squatting, stairs, and locomotion level  PARTICIPATION LIMITATIONS: driving, shopping, community activity, yard work, and church  PERSONAL FACTORS: 1-2 comorbidities: DM, HTN, HLD   are also affecting patient's functional outcome.   REHAB POTENTIAL: Excellent  CLINICAL DECISION MAKING: Stable/uncomplicated  EVALUATION COMPLEXITY: Low  PLAN:  PT FREQUENCY: 1-2x/week  PT DURATION: 8 weeks  PLANNED INTERVENTIONS: Therapeutic  exercises, Therapeutic activity, Neuromuscular re-education, Balance training, Gait training, Patient/Family education, Self Care, Joint mobilization, Stair training, Prosthetic training, and DME instructions  PLAN FOR NEXT SESSION: progress HEP, POC with balance and mobility focus. Limited visits due to medicaid, keep track throughout.      Precious Bard, PT 06/21/2023, 3:31 PM

## 2023-06-20 NOTE — Telephone Encounter (Signed)
Ordered and sent to [facility]

## 2023-06-21 ENCOUNTER — Ambulatory Visit: Payer: Medicaid Other

## 2023-06-21 DIAGNOSIS — M6281 Muscle weakness (generalized): Secondary | ICD-10-CM | POA: Diagnosis not present

## 2023-06-21 DIAGNOSIS — R269 Unspecified abnormalities of gait and mobility: Secondary | ICD-10-CM

## 2023-06-21 DIAGNOSIS — R2689 Other abnormalities of gait and mobility: Secondary | ICD-10-CM | POA: Diagnosis not present

## 2023-06-21 DIAGNOSIS — R262 Difficulty in walking, not elsewhere classified: Secondary | ICD-10-CM

## 2023-06-25 ENCOUNTER — Telehealth: Payer: Self-pay | Admitting: Family Medicine

## 2023-06-25 ENCOUNTER — Other Ambulatory Visit: Payer: Self-pay | Admitting: Family Medicine

## 2023-06-25 DIAGNOSIS — E1169 Type 2 diabetes mellitus with other specified complication: Secondary | ICD-10-CM

## 2023-06-25 NOTE — Telephone Encounter (Signed)
Copied from CRM [id number]. Topic: General - Other >> Jun 25, 2023  4:30 PM Clide Dales wrote: Patient states that his Semaglutide (RYBELSUS) 14 MG TABS requires a prior authorization. Please advise.

## 2023-06-26 ENCOUNTER — Telehealth: Payer: Self-pay

## 2023-06-26 ENCOUNTER — Encounter: Payer: Self-pay | Admitting: Physical Therapy

## 2023-06-26 ENCOUNTER — Ambulatory Visit: Payer: Medicaid Other | Admitting: Physical Therapy

## 2023-06-26 DIAGNOSIS — R262 Difficulty in walking, not elsewhere classified: Secondary | ICD-10-CM | POA: Diagnosis not present

## 2023-06-26 DIAGNOSIS — R2689 Other abnormalities of gait and mobility: Secondary | ICD-10-CM | POA: Diagnosis not present

## 2023-06-26 DIAGNOSIS — M6281 Muscle weakness (generalized): Secondary | ICD-10-CM

## 2023-06-26 DIAGNOSIS — R269 Unspecified abnormalities of gait and mobility: Secondary | ICD-10-CM

## 2023-06-26 NOTE — Telephone Encounter (Signed)
PA initiated for Jardiance through cover my meds.

## 2023-06-26 NOTE — Telephone Encounter (Signed)
Use 1 strip to check bg bid

## 2023-06-26 NOTE — Therapy (Signed)
OUTPATIENT PHYSICAL THERAPY NEURO RE-CERT   Patient Name: [name] MRN: [id number] DOB:1966/08/08, 57 y.o., male Today's Date: 06/28/2023   PCP:   Alba Cory, MD   REFERRING PROVIDER: Georgiana Spinner, NP   END OF SESSION:  PT End of Session - 06/28/23 0737     Visit Number 14    Number of Visits 16    Date for PT Re-Evaluation 08/21/23    Progress Note Due on Visit 20    PT Start Time 1615    PT Stop Time 1700    PT Time Calculation (min) 45 min    Equipment Utilized During Treatment Gait belt    Activity Tolerance Patient tolerated treatment well    Behavior During Therapy WFL for tasks assessed/performed                        Past Medical History:  Diagnosis Date   Diabetes mellitus without complication (HCC)    Hyperlipidemia    Hypertension    Past Surgical History:  Procedure Laterality Date   AMPUTATION Right 04/16/2021   Procedure: AMPUTATION  RIGHT 5TH RAY, INCISION AND DRAINAGE;  Surgeon: Felecia Shelling, DPM;  Location: [facility];  Service: Podiatry;  Laterality: Right;   AMPUTATION Right 11/24/2022   Procedure: AMPUTATION ABOVE KNEE;  Surgeon: Renford Dills, MD;  Location: [facility];  Service: Vascular;  Laterality: Right;   APPLICATION OF WOUND VAC Right 04/21/2021   Procedure: APPLICATION OF WOUND VAC;  Surgeon: Felecia Shelling, DPM;  Location: [facility];  Service: Podiatry;  Laterality: Right;   BONE BIOPSY Left 06/27/2020   Procedure: BONE BIOPSY;  Surgeon: Rosetta Posner, DPM;  Location: [facility];  Service: Podiatry;  Laterality: Left;   COLONOSCOPY WITH PROPOFOL N/A 06/22/2022   Procedure: COLONOSCOPY WITH PROPOFOL;  Surgeon: Wyline Mood, MD;  Location: [facility];  Service: Gastroenterology;  Laterality: N/A;   I & D EXTREMITY Right 11/22/2022   Procedure: IRRIGATION AND DEBRIDEMENT EXTREMITY, below knee amputation right leg;  Surgeon: Leafy Ro, MD;  Location: [facility];  Service: General;  Laterality: Right;   INCISION  AND DRAINAGE Left 06/27/2020   Procedure: INCISION AND DRAINAGE;  Surgeon: Rosetta Posner, DPM;  Location: [facility];  Service: Podiatry;  Laterality: Left;   IRRIGATION AND DEBRIDEMENT FOOT Right 04/21/2021   Procedure: IRRIGATION AND DEBRIDEMENT FOOT;  Surgeon: Felecia Shelling, DPM;  Location: [facility];  Service: Podiatry;  Laterality: Right;   LOWER EXTREMITY ANGIOGRAPHY Left 06/30/2020   Procedure: Lower Extremity Angiography;  Surgeon: Annice Needy, MD;  Location: [facility];  Service: Cardiovascular;  Laterality: Left;   LOWER EXTREMITY ANGIOGRAPHY Right 04/14/2021   Procedure: Lower Extremity Angiography;  Surgeon: Annice Needy, MD;  Location: [facility];  Service: Cardiovascular;  Laterality: Right;   NO PAST SURGERIES     WOUND DEBRIDEMENT Right 04/19/2021   Procedure: DEBRIDEMENT WOUND;  Surgeon: Felecia Shelling, DPM;  Location: [facility];  Service: Podiatry;  Laterality: Right;   Patient Active Problem List   Diagnosis Date Noted   History of above knee amputation, right (HCC) 12/13/2022   Elevated troponin 11/21/2022   B12 deficiency 09/05/2022   Proliferative diabetic retinopathy of both eyes without macular edema associated with type 2 diabetes mellitus (HCC) 06/05/2022   PAD (peripheral artery disease) (HCC) 04/12/2021   Secondary renal hyperparathyroidism (HCC) 03/19/2019   Dyslipidemia associated with type 2 diabetes mellitus (HCC) 07/03/2018  Vitamin D deficiency 04/11/2018   ED (erectile dysfunction) 04/10/2018   Lower urinary tract symptoms (LUTS) 02/09/2017   Hypertension 07/05/2015   Hyperglycemia due to type 2 diabetes mellitus (HCC) 07/05/2015   HLD (hyperlipidemia) 07/05/2015    ONSET DATE: 11/25/23  REFERRING DIAG: T73.220 (ICD-10-CM) - History of above knee amputation, right (HCC)   THERAPY DIAG:  Abnormality of gait and mobility  Muscle weakness (generalized)  Difficulty in walking, not elsewhere classified  Other abnormalities of gait and  mobility  Rationale for Evaluation and Treatment: Rehabilitation  SUBJECTIVE:                                                                                                                                                                                             SUBJECTIVE STATEMENT: Patient reports no falls or LOB, no changes form last session.    Pt accompanied by: significant other  PERTINENT HISTORY: per referring note [name] is a right transfemoral amputee.  At this time he does not have any comorbidities that would impact his mobility or his ability to function with his prosthetic.  His limb is well-healed and ready to be fit with his first prosthesis.  He verbally communicates a strong desire to get a prosthesis.  Currently he is wheelchair-bound but is expected to be able to ambulate with no assistive devices with a prosthesis.  Flores is a K2 level ambulator that spends significant amount of time walking around inside and outside his home.  He will benefit from a K2 level prosthesis with a hydraulic ankle foot for improved stability.   PAIN:  Are you having pain? No  PRECAUTIONS: Fall  WEIGHT BEARING RESTRICTIONS: No  FALLS: Has patient fallen in last 6 months? No  LIVING ENVIRONMENT: Ask visit 2   PLOF: Independent  PATIENT GOALS: Walking better, mowing yard, walk around walking track, walking on uneven terrain, balancing on uneven terrain, using equipment at the [facility]   OBJECTIVE: (objective measures completed at initial evaluation unless otherwise dated)     COGNITION: Overall cognitive status: Within functional limits for tasks assessed   COORDINATION: WNL   POSTURE: posterior pelvic tilt and seated posture in personal WC  LOWER EXTREMITY ROM:      (Blank rows = not tested)  LOWER EXTREMITY MMT:  test later date if indicated, did not want to use eval time to doff / don prosthetic for proper testing   MMT Right Eval Left Eval  Hip flexion     Hip extension    Hip abduction    Hip adduction    Hip internal rotation    Hip  external rotation    Knee flexion    Knee extension    Ankle dorsiflexion    Ankle plantarflexion    Ankle inversion    Ankle eversion    (Blank rows = not tested)  BED MOBILITY:  Pt report no problems with bed mobility at this time.   TRANSFERS: Assistive device utilized: Environmental consultant - 2 wheeled  Sit to stand: Modified independence Stand to sit: Modified independence Chair to chair: Modified independence Floor:  not assessed     STAIRS: Assess at later date if indicated   GAIT: Gait pattern: step through pattern, decreased step length- Left, decreased stance time- Right, decreased stride length, and circumduction- Right Distance walked: 30 ft Assistive device utilized: Environmental consultant - 2 wheeled used [employer] for more room for [employer].  Level of assistance: Modified independence Comments: no lob noted, see above for gait pattern  FUNCTIONAL TESTS:  5 times sit to stand: 21.68 with heavy UE  Timed up and go (TUG): 34.8 sec RW 10 meter walk test: 25.28 sec / .395 m/s  PATIENT SURVEYS:  FOTO 46  TODAY'S TREATMENT:                                                                                                                              DATE: 06/28/23 Unless otherwise stated, CGA was provided and gait belt donned in order to ensure pt safety  Today's exercises preformed at or in // bars  Therex: -Ambulate with SUE support 4x length of // bars  -Ambulating with SPC in L arm 8x length of // bars with good carryover from // bar support. Light cues given for step through.  -Ambulating with SPC in // bars x 4 down and back, working on proprioception SLS balance by stepping laterally on various placed hedgehogs. Pt given cues for slow controlled taps with each.  -Ambulate with SPC in circles around // bars inside and outside // bar able to perform outside // bars for first time this session; ~9-10  rounds with rest breaks every 2-3 laps, improving with confidence, fluidity, and gait sequencing by the end of bouts. Cues given to step through with LLE, as well as steady weight shifting to RLE.  -Lateral stepping with attempts of no UE support 3-4 steps back and forth x 8, pt able to complete final lap without UE support, otherwise using intermittent UE support.     PATIENT EDUCATION: Education details: POC Person educated: Patient Education method: Explanation Education comprehension: verbalized understanding  HOME EXERCISE PROGRAM: Access Code: [email or web address] URL: [email or web address] Date: 04/30/2023 Prepared by: Thresa Ross  Exercises - Standing Marching  - 1 x daily - 7 x weekly - 3 sets - 10 reps - Mini Squat with Counter Support  - 1 x daily - 7 x weekly - 3 sets - 10 reps - Single Leg Stance with Support  - 1 x daily - 7 x weekly - 3 sets - 30 sec hold -  Single Leg Stance  - 1 x daily - 7 x weekly - 3 sets - 30 sec  hold  GOALS: Goals reviewed with patient? Yes  SHORT TERM GOALS: Target date: 05/23/2023     Patient will be independent in home exercise program to improve strength/mobility for better functional independence with ADLs. Baseline: No HEP currently 6/26: HEP compliant Goal status: MET   LONG TERM GOALS: Target date: 07/18/2023   1.  Patient (> 28 years old) will complete five times sit to stand test in < 15 seconds indicating an increased LE strength and improved balance. Baseline: 21.6 sec w/ heavy UE use 6/26: 18.96 seconds with UE support  Goal status: Partially Met   2.  Patient will increase FOTO score to equal to or greater than  53   to demonstrate statistically significant improvement in mobility and quality of life.  Baseline: 46 6/26: 60% Goal status: MET    3.   Patient will reduce timed up and go to <11 seconds to reduce fall risk and demonstrate improved transfer/gait ability. Baseline: 34.8 sec with Rw 6/26:24.02 with  RW Goal status: Partially Met   4.   Patient will increase 10 meter walk test to >.68m/s as to improve gait speed for better community ambulation and to reduce fall risk. Baseline: .395 m/s with RW 6/26: 0.56 m/s with RW  Goal status: Partially Met  5.  Pt will improve to 600 ft or greater to indicate improved community mobility and safety accessing environment independently.  Baseline: unable to ambulate this distance at eval 6/26: 430 ft  Goal status: Partially Met   6. .  Patient will ambulate 30 feet or greater without assistive device and without loss of balance with his prosthesis in order to indicate improved functional mobility and less reliance on assistive device. Baseline: Ambulating with rolling walker currently 6/26:  walk in // bars with SPC 1 length 7/18: walking outside of // pars with SPC x 10 feet at a time, still unsteady and high FOF Goal status: Partially Met    ASSESSMENT:  CLINICAL IMPRESSION: Patient appeared motivated and ready for treatment on this day. Based on combination of previous note and pt's current progress he is ambulating greater distances with SPC than initially, able to ambulate outside of parallel bars. Pt is currently ambulating 430 feet total before fatiguing, pt will need to achieve 600 feet total in order to safely ambulate in the community and improve upon overall functional endurance. Pt is currently improving with functional gait speed and transfer ability evidenced by baseline and initial progress note values per above, though pt's current values for the timed up-and-go test as well as 10 meter walk test are still indicative of a fall risk for this patient. Since previous visit, pt has been progressing with functional ability in all aspects and is gathering confidence in his ability to walk freely with his AD and prosthesis. Patient's condition has the potential to improve in response to therapy. Maximum improvement is yet to be obtained. The  anticipated improvement is attainable and reasonable in a generally predictable time. *All goals to be assessed in the following visit to completely evaluative the most up to date patient progress and improvement  This date pt progressing with high repetitions of ambulatory training with SPC. Able to consistently keep gait sequencing with cane while responding to various cues. Pt willing to attempt new interventions and greater ambulatory distances with good success. Pt will continue to benefit from skilled  physical therapy intervention to address impairments, improve QOL, and attain therapy goals.   OBJECTIVE IMPAIRMENTS: Abnormal gait, decreased activity tolerance, decreased balance, decreased endurance, decreased knowledge of use of DME, decreased mobility, difficulty walking, decreased strength, and hypomobility.   ACTIVITY LIMITATIONS: carrying, lifting, bending, standing, squatting, stairs, and locomotion level  PARTICIPATION LIMITATIONS: driving, shopping, community activity, yard work, and church  PERSONAL FACTORS: 1-2 comorbidities: DM, HTN, HLD   are also affecting patient's functional outcome.   REHAB POTENTIAL: Excellent  CLINICAL DECISION MAKING: Stable/uncomplicated  EVALUATION COMPLEXITY: Low  PLAN:  PT FREQUENCY: 1-2x/week  PT DURATION: 8 weeks  PLANNED INTERVENTIONS: Therapeutic exercises, Therapeutic activity, Neuromuscular re-education, Balance training, Gait training, Patient/Family education, Self Care, Joint mobilization, Stair training, Prosthetic training, and DME instructions  PLAN FOR NEXT SESSION: Assess all current goals per POC.   Norman Herrlich, PT 06/28/2023, 7:38 AM

## 2023-06-27 ENCOUNTER — Encounter: Payer: Self-pay | Admitting: Physical Therapy

## 2023-06-29 DIAGNOSIS — Z89611 Acquired absence of right leg above knee: Secondary | ICD-10-CM | POA: Diagnosis not present

## 2023-07-02 ENCOUNTER — Other Ambulatory Visit: Payer: Self-pay

## 2023-07-02 ENCOUNTER — Telehealth: Payer: Self-pay | Admitting: Family Medicine

## 2023-07-02 DIAGNOSIS — E1169 Type 2 diabetes mellitus with other specified complication: Secondary | ICD-10-CM

## 2023-07-02 MED ORDER — ACCU-CHEK GUIDE VI STRP
1.0000 | ORAL_STRIP | Freq: Two times a day (BID) | 0 refills | Status: DC
Start: 2023-07-02 — End: 2023-08-27

## 2023-07-02 NOTE — Telephone Encounter (Signed)
Called patient and made aware all medicines have been sent in. Per CoverMyMeds- Jardiance: "The drug has been approved from 06/12/2023 to 06/25/2024", Rybelsus: "Coverage good through 12/22/23", and test strips sent to [facility] on 06/26/23. Patient gave verbal understanding.

## 2023-07-02 NOTE — Telephone Encounter (Signed)
Copied from CRM [id number]. Topic: General - Other >> Jul 02, 2023 10:08 AM Phill Myron wrote: Patient stated he spoke with the pharmacy and they told him that they need clarification on how many test strips he would need? Please advise

## 2023-07-02 NOTE — Telephone Encounter (Signed)
Clarification sent in

## 2023-07-02 NOTE — Telephone Encounter (Signed)
Copied from CRM [id number]. Topic: General - Other >> Jun 29, 2023  4:06 PM Everette C wrote: Reason for CRM: The patient has called to check on the status of their prior authorizations for their Jaridance, Rybeslsus and Test strips   The patient would like to know when they can anticipate the completion of authorizations  Please contact the patient further when available

## 2023-07-03 ENCOUNTER — Ambulatory Visit: Payer: Medicaid Other

## 2023-07-03 DIAGNOSIS — M6281 Muscle weakness (generalized): Secondary | ICD-10-CM

## 2023-07-03 DIAGNOSIS — R2689 Other abnormalities of gait and mobility: Secondary | ICD-10-CM

## 2023-07-03 DIAGNOSIS — R269 Unspecified abnormalities of gait and mobility: Secondary | ICD-10-CM | POA: Diagnosis not present

## 2023-07-03 DIAGNOSIS — R262 Difficulty in walking, not elsewhere classified: Secondary | ICD-10-CM | POA: Diagnosis not present

## 2023-07-03 NOTE — Therapy (Signed)
OUTPATIENT PHYSICAL THERAPY NEURO    Patient Name: [name] MRN: [id number] DOB:1966/07/10, 57 y.o., male Today's Date: 07/03/2023   PCP:   Alba Cory, MD   REFERRING PROVIDER: Georgiana Spinner, NP   END OF SESSION:  PT End of Session - 07/03/23 1147     Visit Number 15    Number of Visits 23    Date for PT Re-Evaluation 08/21/23    Authorization Type 8 left    Progress Note Due on Visit 20    PT Start Time 1145    PT Stop Time 1229    PT Time Calculation (min) 44 min    Equipment Utilized During Treatment Gait belt    Activity Tolerance Patient tolerated treatment well    Behavior During Therapy WFL for tasks assessed/performed                         Past Medical History:  Diagnosis Date   Diabetes mellitus without complication (HCC)    Hyperlipidemia    Hypertension    Past Surgical History:  Procedure Laterality Date   AMPUTATION Right 04/16/2021   Procedure: AMPUTATION  RIGHT 5TH RAY, INCISION AND DRAINAGE;  Surgeon: Felecia Shelling, DPM;  Location: [facility];  Service: Podiatry;  Laterality: Right;   AMPUTATION Right 11/24/2022   Procedure: AMPUTATION ABOVE KNEE;  Surgeon: Renford Dills, MD;  Location: [facility];  Service: Vascular;  Laterality: Right;   APPLICATION OF WOUND VAC Right 04/21/2021   Procedure: APPLICATION OF WOUND VAC;  Surgeon: Felecia Shelling, DPM;  Location: [facility];  Service: Podiatry;  Laterality: Right;   BONE BIOPSY Left 06/27/2020   Procedure: BONE BIOPSY;  Surgeon: Rosetta Posner, DPM;  Location: [facility];  Service: Podiatry;  Laterality: Left;   COLONOSCOPY WITH PROPOFOL N/A 06/22/2022   Procedure: COLONOSCOPY WITH PROPOFOL;  Surgeon: Wyline Mood, MD;  Location: [facility] ENDOSCOPY;  Service: Gastroenterology;  Laterality: N/A;   I & D EXTREMITY Right 11/22/2022   Procedure: IRRIGATION AND DEBRIDEMENT EXTREMITY, below knee amputation right leg;  Surgeon: Leafy Ro, MD;  Location: [facility];  Service: General;   Laterality: Right;   INCISION AND DRAINAGE Left 06/27/2020   Procedure: INCISION AND DRAINAGE;  Surgeon: Rosetta Posner, DPM;  Location: [facility];  Service: Podiatry;  Laterality: Left;   IRRIGATION AND DEBRIDEMENT FOOT Right 04/21/2021   Procedure: IRRIGATION AND DEBRIDEMENT FOOT;  Surgeon: Felecia Shelling, DPM;  Location: [facility];  Service: Podiatry;  Laterality: Right;   LOWER EXTREMITY ANGIOGRAPHY Left 06/30/2020   Procedure: Lower Extremity Angiography;  Surgeon: Annice Needy, MD;  Location: [facility];  Service: Cardiovascular;  Laterality: Left;   LOWER EXTREMITY ANGIOGRAPHY Right 04/14/2021   Procedure: Lower Extremity Angiography;  Surgeon: Annice Needy, MD;  Location: [facility];  Service: Cardiovascular;  Laterality: Right;   NO PAST SURGERIES     WOUND DEBRIDEMENT Right 04/19/2021   Procedure: DEBRIDEMENT WOUND;  Surgeon: Felecia Shelling, DPM;  Location: [facility];  Service: Podiatry;  Laterality: Right;   Patient Active Problem List   Diagnosis Date Noted   History of above knee amputation, right (HCC) 12/13/2022   Elevated troponin 11/21/2022   B12 deficiency 09/05/2022   Proliferative diabetic retinopathy of both eyes without macular edema associated with type 2 diabetes mellitus (HCC) 06/05/2022   PAD (peripheral artery disease) (HCC) 04/12/2021   Secondary renal hyperparathyroidism (HCC) 03/19/2019   Dyslipidemia associated with  type 2 diabetes mellitus (HCC) 07/03/2018   Vitamin D deficiency 04/11/2018   ED (erectile dysfunction) 04/10/2018   Lower urinary tract symptoms (LUTS) 02/09/2017   Hypertension 07/05/2015   Hyperglycemia due to type 2 diabetes mellitus (HCC) 07/05/2015   HLD (hyperlipidemia) 07/05/2015    ONSET DATE: 11/25/23  REFERRING DIAG: N82.956 (ICD-10-CM) - History of above knee amputation, right (HCC)   THERAPY DIAG:  Abnormality of gait and mobility  Muscle weakness (generalized)  Difficulty in walking, not elsewhere  classified  Other abnormalities of gait and mobility  Rationale for Evaluation and Treatment: Rehabilitation  SUBJECTIVE:                                                                                                                                                                                             SUBJECTIVE STATEMENT: Patient has been practicing with a cane at home.     Pt accompanied by: significant other  PERTINENT HISTORY: per referring note [name] is a right transfemoral amputee.  At this time he does not have any comorbidities that would impact his mobility or his ability to function with his prosthetic.  His limb is well-healed and ready to be fit with his first prosthesis.  He verbally communicates a strong desire to get a prosthesis.  Currently he is wheelchair-bound but is expected to be able to ambulate with no assistive devices with a prosthesis.  [name] is a K2 level ambulator that spends significant amount of time walking around inside and outside his home.  He will benefit from a K2 level prosthesis with a hydraulic ankle foot for improved stability.   PAIN:  Are you having pain? No  PRECAUTIONS: Fall  WEIGHT BEARING RESTRICTIONS: No  FALLS: Has patient fallen in last 6 months? No  LIVING ENVIRONMENT: Ask visit 2   PLOF: Independent  PATIENT GOALS: Walking better, mowing yard, walk around walking track, walking on uneven terrain, balancing on uneven terrain, using equipment at the [facility]   OBJECTIVE: (objective measures completed at initial evaluation unless otherwise dated)     COGNITION: Overall cognitive status: Within functional limits for tasks assessed   COORDINATION: WNL   POSTURE: posterior pelvic tilt and seated posture in personal WC  LOWER EXTREMITY ROM:      (Blank rows = not tested)  LOWER EXTREMITY MMT:  test later date if indicated, did not want to use eval time to doff / don prosthetic for proper testing   MMT  Right Eval Left Eval  Hip flexion    Hip extension    Hip abduction    Hip adduction  Hip internal rotation    Hip external rotation    Knee flexion    Knee extension    Ankle dorsiflexion    Ankle plantarflexion    Ankle inversion    Ankle eversion    (Blank rows = not tested)  BED MOBILITY:  Pt report no problems with bed mobility at this time.   TRANSFERS: Assistive device utilized: Environmental consultant - 2 wheeled  Sit to stand: Modified independence Stand to sit: Modified independence Chair to chair: Modified independence Floor:  not assessed     STAIRS: Assess at later date if indicated   GAIT: Gait pattern: step through pattern, decreased step length- Left, decreased stance time- Right, decreased stride length, and circumduction- Right Distance walked: 30 ft Assistive device utilized: Environmental consultant - 2 wheeled used [employer] for more room for [facility] [employer].  Level of assistance: Modified independence Comments: no lob noted, see above for gait pattern  FUNCTIONAL TESTS:  5 times sit to stand: 21.68 with heavy UE  Timed up and go (TUG): 34.8 sec RW 10 meter walk test: 25.28 sec / .395 m/s  PATIENT SURVEYS:  FOTO 46  TODAY'S TREATMENT:                                                                                                                              DATE: 07/03/23 Perform goals: see below for details:   Unless otherwise stated, CGA was provided and gait belt donned in order to ensure pt safety   Therex:  Ambulate 86 ft with [facility] and wheelchair follow in hallway; cues for sequencing and safe body mechanics.     PATIENT EDUCATION: Education details: POC Person educated: Patient Education method: Explanation Education comprehension: verbalized understanding  HOME EXERCISE PROGRAM: Access Code: [email or web address] URL: [email or web address] Date: 04/30/2023 Prepared by: Thresa Ross  Exercises - Standing Marching  - 1 x daily - 7 x weekly - 3 sets - 10  reps - Mini Squat with Counter Support  - 1 x daily - 7 x weekly - 3 sets - 10 reps - Single Leg Stance with Support  - 1 x daily - 7 x weekly - 3 sets - 30 sec hold - Single Leg Stance  - 1 x daily - 7 x weekly - 3 sets - 30 sec  hold  GOALS: Goals reviewed with patient? Yes  SHORT TERM GOALS: Target date: 05/23/2023     Patient will be independent in home exercise program to improve strength/mobility for better functional independence with ADLs. Baseline: No HEP currently 6/26: HEP compliant Goal status: MET   LONG TERM GOALS: Target date: 08/21/2023   1.  Patient (> 49 years old) will complete five times sit to stand test in < 15 seconds indicating an increased LE strength and improved balance. Baseline: 21.6 sec w/ heavy UE use 6/26: 18.96 seconds with UE support  7/23:14 seconds with BUE support  Goal status: MET  2.  Patient will increase  FOTO score to equal to or greater than  53   to demonstrate statistically significant improvement in mobility and quality of life.  Baseline: 46 6/26: 60% Goal status: MET    3.   Patient will reduce timed up and go to <11 seconds to reduce fall risk and demonstrate improved transfer/gait ability. Baseline: 34.8 sec with Rw 6/26:24.02 with RW 7/23: 20 seconds with RW  Goal status: Partially Met   4.   Patient will increase 10 meter walk test to >.62m/s as to improve gait speed for better community ambulation and to reduce fall risk. Baseline: .395 m/s with RW 6/26: 0.56 m/s with RW 7/23: 0.68 m/s with RW  Goal status: Partially Met  5.  Pt will improve to 100 ft or greater to indicate improved community mobility and safety accessing environment independently.  Baseline: unable to ambulate this distance at eval 6/26: 430 ft  7/23: 650 ft with RW  Goal status:Met; revised to make harder:   6. .  Patient will ambulate 30 feet or greater without assistive device and without loss of balance with his prosthesis in order to indicate  improved functional mobility and less reliance on assistive device. Baseline: Ambulating with rolling walker currently 6/26:  walk in // bars with SPC 1 length 7/18: walking outside of // pars with SPC x 10 feet at a time, still unsteady and high FOF 7/23: 85 ft with [facility] with wheelchair follow  Goal status: Partially Met    ASSESSMENT:  CLINICAL IMPRESSION: Patient has met his 6 minute walk test goal, goal progressed to be functional community Ambulator. He additionally met his 5x STS goal and is able to ambulate with [facility] for >80 ft now.  Patient is progressing with excellent focus, will decrease to 1x/week due to insurance limitations. Pt will continue to benefit from skilled physical therapy intervention to address impairments, improve QOL, and attain therapy goals.   OBJECTIVE IMPAIRMENTS: Abnormal gait, decreased activity tolerance, decreased balance, decreased endurance, decreased knowledge of use of DME, decreased mobility, difficulty walking, decreased strength, and hypomobility.   ACTIVITY LIMITATIONS: carrying, lifting, bending, standing, squatting, stairs, and locomotion level  PARTICIPATION LIMITATIONS: driving, shopping, community activity, yard work, and church  PERSONAL FACTORS: 1-2 comorbidities: DM, HTN, HLD   are also affecting patient's functional outcome.   REHAB POTENTIAL: Excellent  CLINICAL DECISION MAKING: Stable/uncomplicated  EVALUATION COMPLEXITY: Low  PLAN:  PT FREQUENCY: 1-2x/week  PT DURATION: 8 weeks  PLANNED INTERVENTIONS: Therapeutic exercises, Therapeutic activity, Neuromuscular re-education, Balance training, Gait training, Patient/Family education, Self Care, Joint mobilization, Stair training, Prosthetic training, and DME instructions  PLAN FOR NEXT SESSION: progress mobility .   Precious Bard, PT 07/03/2023, 12:55 PM

## 2023-07-05 ENCOUNTER — Ambulatory Visit: Payer: Medicaid Other

## 2023-07-09 ENCOUNTER — Ambulatory Visit: Payer: Medicaid Other

## 2023-07-10 NOTE — Therapy (Signed)
OUTPATIENT PHYSICAL THERAPY NEURO    Patient Name: [name] MRN: [id number] DOB:March 07, 1966, 57 y.o., male Today's Date: 07/11/2023   PCP:   Alba Cory, MD   REFERRING PROVIDER: Georgiana Spinner, NP   END OF SESSION:  PT End of Session - 07/11/23 1528     Visit Number 16    Number of Visits 23    Date for PT Re-Evaluation 08/21/23    Authorization Type 7 left    Progress Note Due on Visit 20    PT Start Time 1529    PT Stop Time 1614    PT Time Calculation (min) 45 min    Equipment Utilized During Treatment Gait belt    Activity Tolerance Patient tolerated treatment well    Behavior During Therapy WFL for tasks assessed/performed                          Past Medical History:  Diagnosis Date   Diabetes mellitus without complication (HCC)    Hyperlipidemia    Hypertension    Past Surgical History:  Procedure Laterality Date   AMPUTATION Right 04/16/2021   Procedure: AMPUTATION  RIGHT 5TH RAY, INCISION AND DRAINAGE;  Surgeon: Felecia Shelling, DPM;  Location: [facility];  Service: Podiatry;  Laterality: Right;   AMPUTATION Right 11/24/2022   Procedure: AMPUTATION ABOVE KNEE;  Surgeon: Renford Dills, MD;  Location: [facility];  Service: Vascular;  Laterality: Right;   APPLICATION OF WOUND VAC Right 04/21/2021   Procedure: APPLICATION OF WOUND VAC;  Surgeon: Felecia Shelling, DPM;  Location: [facility];  Service: Podiatry;  Laterality: Right;   BONE BIOPSY Left 06/27/2020   Procedure: BONE BIOPSY;  Surgeon: Rosetta Posner, DPM;  Location: [facility];  Service: Podiatry;  Laterality: Left;   COLONOSCOPY WITH PROPOFOL N/A 06/22/2022   Procedure: COLONOSCOPY WITH PROPOFOL;  Surgeon: Wyline Mood, MD;  Location: [facility] ENDOSCOPY;  Service: Gastroenterology;  Laterality: N/A;   I & D EXTREMITY Right 11/22/2022   Procedure: IRRIGATION AND DEBRIDEMENT EXTREMITY, below knee amputation right leg;  Surgeon: Leafy Ro, MD;  Location: [facility];  Service: General;   Laterality: Right;   INCISION AND DRAINAGE Left 06/27/2020   Procedure: INCISION AND DRAINAGE;  Surgeon: Rosetta Posner, DPM;  Location: [facility];  Service: Podiatry;  Laterality: Left;   IRRIGATION AND DEBRIDEMENT FOOT Right 04/21/2021   Procedure: IRRIGATION AND DEBRIDEMENT FOOT;  Surgeon: Felecia Shelling, DPM;  Location: [facility];  Service: Podiatry;  Laterality: Right;   LOWER EXTREMITY ANGIOGRAPHY Left 06/30/2020   Procedure: Lower Extremity Angiography;  Surgeon: Annice Needy, MD;  Location: [facility];  Service: Cardiovascular;  Laterality: Left;   LOWER EXTREMITY ANGIOGRAPHY Right 04/14/2021   Procedure: Lower Extremity Angiography;  Surgeon: Annice Needy, MD;  Location: [facility];  Service: Cardiovascular;  Laterality: Right;   NO PAST SURGERIES     WOUND DEBRIDEMENT Right 04/19/2021   Procedure: DEBRIDEMENT WOUND;  Surgeon: Felecia Shelling, DPM;  Location: [facility];  Service: Podiatry;  Laterality: Right;   Patient Active Problem List   Diagnosis Date Noted   History of above knee amputation, right (HCC) 12/13/2022   Elevated troponin 11/21/2022   B12 deficiency 09/05/2022   Proliferative diabetic retinopathy of both eyes without macular edema associated with type 2 diabetes mellitus (HCC) 06/05/2022   PAD (peripheral artery disease) (HCC) 04/12/2021   Secondary renal hyperparathyroidism (HCC) 03/19/2019   Dyslipidemia associated  with type 2 diabetes mellitus (HCC) 07/03/2018   Vitamin D deficiency 04/11/2018   ED (erectile dysfunction) 04/10/2018   Lower urinary tract symptoms (LUTS) 02/09/2017   Hypertension 07/05/2015   Hyperglycemia due to type 2 diabetes mellitus (HCC) 07/05/2015   HLD (hyperlipidemia) 07/05/2015    ONSET DATE: 11/25/23  REFERRING DIAG: Z61.096 (ICD-10-CM) - History of above knee amputation, right (HCC)   THERAPY DIAG:  Abnormality of gait and mobility  Muscle weakness (generalized)  Difficulty in walking, not elsewhere  classified  Other abnormalities of gait and mobility  Rationale for Evaluation and Treatment: Rehabilitation  SUBJECTIVE:                                                                                                                                                                                             SUBJECTIVE STATEMENT: Patient has been compliant with HEP, no falls or LOB since last session.     Pt accompanied by: significant other  PERTINENT HISTORY: per referring note Mearl Kellom is a right transfemoral amputee.  At this time he does not have any comorbidities that would impact his mobility or his ability to function with his prosthetic.  His limb is well-healed and ready to be fit with his first prosthesis.  He verbally communicates a strong desire to get a prosthesis.  Currently he is wheelchair-bound but is expected to be able to ambulate with no assistive devices with a prosthesis.  Norbert is a K2 level ambulator that spends significant amount of time walking around inside and outside his home.  He will benefit from a K2 level prosthesis with a hydraulic ankle foot for improved stability.   PAIN:  Are you having pain? No  PRECAUTIONS: Fall  WEIGHT BEARING RESTRICTIONS: No  FALLS: Has patient fallen in last 6 months? No  LIVING ENVIRONMENT: Ask visit 2   PLOF: Independent  PATIENT GOALS: Walking better, mowing yard, walk around walking track, walking on uneven terrain, balancing on uneven terrain, using equipment at the [facility]   OBJECTIVE: (objective measures completed at initial evaluation unless otherwise dated)     COGNITION: Overall cognitive status: Within functional limits for tasks assessed   COORDINATION: WNL   POSTURE: posterior pelvic tilt and seated posture in personal WC  LOWER EXTREMITY ROM:      (Blank rows = not tested)  LOWER EXTREMITY MMT:  test later date if indicated, did not want to use eval time to doff / don prosthetic for proper  testing   MMT Right Eval Left Eval  Hip flexion    Hip extension    Hip abduction  Hip adduction    Hip internal rotation    Hip external rotation    Knee flexion    Knee extension    Ankle dorsiflexion    Ankle plantarflexion    Ankle inversion    Ankle eversion    (Blank rows = not tested)  BED MOBILITY:  Pt report no problems with bed mobility at this time.   TRANSFERS: Assistive device utilized: Environmental consultant - 2 wheeled  Sit to stand: Modified independence Stand to sit: Modified independence Chair to chair: Modified independence Floor:  not assessed     STAIRS: Assess at later date if indicated   GAIT: Gait pattern: step through pattern, decreased step length- Left, decreased stance time- Right, decreased stride length, and circumduction- Right Distance walked: 30 ft Assistive device utilized: Environmental consultant - 2 wheeled used [employer] for more room for [facility].  Level of assistance: Modified independence Comments: no lob noted, see above for gait pattern  FUNCTIONAL TESTS:  5 times sit to stand: 21.68 with heavy UE  Timed up and go (TUG): 34.8 sec RW 10 meter walk test: 25.28 sec / .395 m/s  PATIENT SURVEYS:  FOTO 46  TODAY'S TREATMENT:                                                                                                                              DATE: 07/11/23   Unless otherwise stated, CGA was provided and gait belt donned in order to ensure pt safety   Therex:  Ambulate 2 x 86 ft with [facility] and wheelchair follow in hallway; cues for sequencing and safe body mechanics.  Ambulate 86 ft with SPC and wheelchair follow in hallway with head turns; cues for sequencing and safe body mechanics STS from elevated mat table with SPC and CGA x 5 STS from elevated mat table, CGA with focus on arm swing momentum forward to back to aide in standing x 5 STS from elevated mat table with basketball shot at full stand, CGA x 26, cues for control on descent Sidesteps  along mat table x4 lengths Steps on hedgehogs with focus on weight shift to R LE with SPC and CGA x 10     PATIENT EDUCATION: Education details: POC Person educated: Patient Education method: Explanation Education comprehension: verbalized understanding  HOME EXERCISE PROGRAM: Access Code: [email or web address] URL: [email or web address] Date: 04/30/2023 Prepared by: Thresa Ross  Exercises - Standing Marching  - 1 x daily - 7 x weekly - 3 sets - 10 reps - Mini Squat with Counter Support  - 1 x daily - 7 x weekly - 3 sets - 10 reps - Single Leg Stance with Support  - 1 x daily - 7 x weekly - 3 sets - 30 sec hold - Single Leg Stance  - 1 x daily - 7 x weekly - 3 sets - 30 sec  hold  GOALS: Goals reviewed with patient? Yes  SHORT TERM GOALS: Target date: 05/23/2023  Patient will be independent in home exercise program to improve strength/mobility for better functional independence with ADLs. Baseline: No HEP currently 6/26: HEP compliant Goal status: MET   LONG TERM GOALS: Target date: 08/21/2023   1.  Patient (> 89 years old) will complete five times sit to stand test in < 15 seconds indicating an increased LE strength and improved balance. Baseline: 21.6 sec w/ heavy UE use 6/26: 18.96 seconds with UE support  7/23:14 seconds with BUE support  Goal status: MET  2.  Patient will increase FOTO score to equal to or greater than  53   to demonstrate statistically significant improvement in mobility and quality of life.  Baseline: 46 6/26: 60% Goal status: MET    3.   Patient will reduce timed up and go to <11 seconds to reduce fall risk and demonstrate improved transfer/gait ability. Baseline: 34.8 sec with Rw 6/26:24.02 with RW 7/23: 20 seconds with RW  Goal status: Partially Met   4.   Patient will increase 10 meter walk test to >.41m/s as to improve gait speed for better community ambulation and to reduce fall risk. Baseline: .395 m/s with RW 6/26: 0.56 m/s  with RW 7/23: 0.68 m/s with RW  Goal status: Partially Met  5.  Pt will improve to 100 ft or greater to indicate improved community mobility and safety accessing environment independently.  Baseline: unable to ambulate this distance at eval 6/26: 430 ft  7/23: 650 ft with RW  Goal status:Met; revised to make harder:   6. .  Patient will ambulate 30 feet or greater without assistive device and without loss of balance with his prosthesis in order to indicate improved functional mobility and less reliance on assistive device. Baseline: Ambulating with rolling walker currently 6/26:  walk in // bars with SPC 1 length 7/18: walking outside of // pars with SPC x 10 feet at a time, still unsteady and high FOF 7/23: 85 ft with [facility] with wheelchair follow  Goal status: Partially Met    ASSESSMENT:  CLINICAL IMPRESSION: Patient tolerates progression of ambulation with SPC with CGA and cues for "to it and through it" . Patient encouraged to perform one long duration ambulation with RW between now and next session and continue short duration ambulation with SPC in safe home environment. Patient is progressing with stabilization with cane performing multiple sit to stands without AD and side stepping with SPC. Pt will continue to benefit from skilled physical therapy intervention to address impairments, improve QOL, and attain therapy goals.   OBJECTIVE IMPAIRMENTS: Abnormal gait, decreased activity tolerance, decreased balance, decreased endurance, decreased knowledge of use of DME, decreased mobility, difficulty walking, decreased strength, and hypomobility.   ACTIVITY LIMITATIONS: carrying, lifting, bending, standing, squatting, stairs, and locomotion level  PARTICIPATION LIMITATIONS: driving, shopping, community activity, yard work, and church  PERSONAL FACTORS: 1-2 comorbidities: DM, HTN, HLD   are also affecting patient's functional outcome.   REHAB POTENTIAL: Excellent  CLINICAL DECISION  MAKING: Stable/uncomplicated  EVALUATION COMPLEXITY: Low  PLAN:  PT FREQUENCY: 1-2x/week  PT DURATION: 8 weeks  PLANNED INTERVENTIONS: Therapeutic exercises, Therapeutic activity, Neuromuscular re-education, Balance training, Gait training, Patient/Family education, Self Care, Joint mobilization, Stair training, Prosthetic training, and DME instructions  PLAN FOR NEXT SESSION: progress mobility .   Precious Bard, PT 07/11/2023, 4:14 PM

## 2023-07-11 ENCOUNTER — Ambulatory Visit: Payer: Medicaid Other

## 2023-07-11 DIAGNOSIS — M6281 Muscle weakness (generalized): Secondary | ICD-10-CM | POA: Diagnosis not present

## 2023-07-11 DIAGNOSIS — R269 Unspecified abnormalities of gait and mobility: Secondary | ICD-10-CM

## 2023-07-11 DIAGNOSIS — R262 Difficulty in walking, not elsewhere classified: Secondary | ICD-10-CM | POA: Diagnosis not present

## 2023-07-11 DIAGNOSIS — R2689 Other abnormalities of gait and mobility: Secondary | ICD-10-CM | POA: Diagnosis not present

## 2023-07-16 ENCOUNTER — Ambulatory Visit: Payer: Medicaid Other

## 2023-07-17 NOTE — Therapy (Signed)
OUTPATIENT PHYSICAL THERAPY NEURO    Patient Name: [name] MRN: [id number] DOB:1966-08-30, 57 y.o., male Today's Date: 07/18/2023   PCP:   Alba Cory, MD   REFERRING PROVIDER: Georgiana Spinner, NP   END OF SESSION:  PT End of Session - 07/18/23 1612     Visit Number 17    Number of Visits 23    Date for PT Re-Evaluation 08/21/23    Authorization Type 6 left    Progress Note Due on Visit 20    PT Start Time 1615    PT Stop Time 1659    PT Time Calculation (min) 44 min    Equipment Utilized During Treatment Gait belt    Activity Tolerance Patient tolerated treatment well    Behavior During Therapy WFL for tasks assessed/performed                           Past Medical History:  Diagnosis Date   Diabetes mellitus without complication (HCC)    Hyperlipidemia    Hypertension    Past Surgical History:  Procedure Laterality Date   AMPUTATION Right 04/16/2021   Procedure: AMPUTATION  RIGHT 5TH RAY, INCISION AND DRAINAGE;  Surgeon: Felecia Shelling, DPM;  Location: [facility];  Service: Podiatry;  Laterality: Right;   AMPUTATION Right 11/24/2022   Procedure: AMPUTATION ABOVE KNEE;  Surgeon: Renford Dills, MD;  Location: [facility];  Service: Vascular;  Laterality: Right;   APPLICATION OF WOUND VAC Right 04/21/2021   Procedure: APPLICATION OF WOUND VAC;  Surgeon: Felecia Shelling, DPM;  Location: [facility];  Service: Podiatry;  Laterality: Right;   BONE BIOPSY Left 06/27/2020   Procedure: BONE BIOPSY;  Surgeon: Rosetta Posner, DPM;  Location: [facility];  Service: Podiatry;  Laterality: Left;   COLONOSCOPY WITH PROPOFOL N/A 06/22/2022   Procedure: COLONOSCOPY WITH PROPOFOL;  Surgeon: Wyline Mood, MD;  Location: [facility] ENDOSCOPY;  Service: Gastroenterology;  Laterality: N/A;   I & D EXTREMITY Right 11/22/2022   Procedure: IRRIGATION AND DEBRIDEMENT EXTREMITY, below knee amputation right leg;  Surgeon: Leafy Ro, MD;  Location: [facility];  Service: General;   Laterality: Right;   INCISION AND DRAINAGE Left 06/27/2020   Procedure: INCISION AND DRAINAGE;  Surgeon: Rosetta Posner, DPM;  Location: [facility];  Service: Podiatry;  Laterality: Left;   IRRIGATION AND DEBRIDEMENT FOOT Right 04/21/2021   Procedure: IRRIGATION AND DEBRIDEMENT FOOT;  Surgeon: Felecia Shelling, DPM;  Location: [facility];  Service: Podiatry;  Laterality: Right;   LOWER EXTREMITY ANGIOGRAPHY Left 06/30/2020   Procedure: Lower Extremity Angiography;  Surgeon: Annice Needy, MD;  Location: [facility];  Service: Cardiovascular;  Laterality: Left;   LOWER EXTREMITY ANGIOGRAPHY Right 04/14/2021   Procedure: Lower Extremity Angiography;  Surgeon: Annice Needy, MD;  Location: [facility];  Service: Cardiovascular;  Laterality: Right;   NO PAST SURGERIES     WOUND DEBRIDEMENT Right 04/19/2021   Procedure: DEBRIDEMENT WOUND;  Surgeon: Felecia Shelling, DPM;  Location: [facility];  Service: Podiatry;  Laterality: Right;   Patient Active Problem List   Diagnosis Date Noted   History of above knee amputation, right (HCC) 12/13/2022   Elevated troponin 11/21/2022   B12 deficiency 09/05/2022   Proliferative diabetic retinopathy of both eyes without macular edema associated with type 2 diabetes mellitus (HCC) 06/05/2022   PAD (peripheral artery disease) (HCC) 04/12/2021   Secondary renal hyperparathyroidism (HCC) 03/19/2019   Dyslipidemia  associated with type 2 diabetes mellitus (HCC) 07/03/2018   Vitamin D deficiency 04/11/2018   ED (erectile dysfunction) 04/10/2018   Lower urinary tract symptoms (LUTS) 02/09/2017   Hypertension 07/05/2015   Hyperglycemia due to type 2 diabetes mellitus (HCC) 07/05/2015   HLD (hyperlipidemia) 07/05/2015    ONSET DATE: 11/25/23  REFERRING DIAG: K44.010 (ICD-10-CM) - History of above knee amputation, right (HCC)   THERAPY DIAG:  Abnormality of gait and mobility  Muscle weakness (generalized)  Difficulty in walking, not elsewhere  classified  Other abnormalities of gait and mobility  Rationale for Evaluation and Treatment: Rehabilitation  SUBJECTIVE:                                                                                                                                                                                             SUBJECTIVE STATEMENT: Patient reports compliance with HEP. Is feeling more steady with SPC     Pt accompanied by: significant other  PERTINENT HISTORY: per referring note [name] is a right transfemoral amputee.  At this time he does not have any comorbidities that would impact his mobility or his ability to function with his prosthetic.  His limb is well-healed and ready to be fit with his first prosthesis.  He verbally communicates a strong desire to get a prosthesis.  Currently he is wheelchair-bound but is expected to be able to ambulate with no assistive devices with a prosthesis.  [name] is a K2 level ambulator that spends significant amount of time walking around inside and outside his home.  He will benefit from a K2 level prosthesis with a hydraulic ankle foot for improved stability.   PAIN:  Are you having pain? No  PRECAUTIONS: Fall  WEIGHT BEARING RESTRICTIONS: No  FALLS: Has patient fallen in last 6 months? No  LIVING ENVIRONMENT: Ask visit 2   PLOF: Independent  PATIENT GOALS: Walking better, mowing yard, walk around walking track, walking on uneven terrain, balancing on uneven terrain, using equipment at the [facility]   OBJECTIVE: (objective measures completed at initial evaluation unless otherwise dated)     COGNITION: Overall cognitive status: Within functional limits for tasks assessed   COORDINATION: WNL   POSTURE: posterior pelvic tilt and seated posture in personal WC  LOWER EXTREMITY ROM:      (Blank rows = not tested)  LOWER EXTREMITY MMT:  test later date if indicated, did not want to use eval time to doff / don prosthetic for proper  testing   MMT Right Eval Left Eval  Hip flexion    Hip extension    Hip abduction    Hip  adduction    Hip internal rotation    Hip external rotation    Knee flexion    Knee extension    Ankle dorsiflexion    Ankle plantarflexion    Ankle inversion    Ankle eversion    (Blank rows = not tested)  BED MOBILITY:  Pt report no problems with bed mobility at this time.   TRANSFERS: Assistive device utilized: Environmental consultant - 2 wheeled  Sit to stand: Modified independence Stand to sit: Modified independence Chair to chair: Modified independence Floor:  not assessed     STAIRS: Assess at later date if indicated   GAIT: Gait pattern: step through pattern, decreased step length- Left, decreased stance time- Right, decreased stride length, and circumduction- Right Distance walked: 30 ft Assistive device utilized: Environmental consultant - 2 wheeled used [employer] for more room for [facility].  Level of assistance: Modified independence Comments: no lob noted, see above for gait pattern  FUNCTIONAL TESTS:  5 times sit to stand: 21.68 with heavy UE  Timed up and go (TUG): 34.8 sec RW 10 meter walk test: 25.28 sec / .395 m/s  PATIENT SURVEYS:  FOTO 46  TODAY'S TREATMENT:                                                                                                                              DATE: 07/18/23   Unless otherwise stated, CGA was provided and gait belt donned in order to ensure pt safety   Therex:  Ambulate 2 x 86 ft with [facility] and wheelchair follow in hallway; cues for sequencing and safe body mechanics.  Ambulate 86 ft with SPC and wheelchair follow in hallway with head turns; cues for sequencing and safe body mechanics Ambulate with SPC weaving between 5 cones with wheelchair follow. X2 trials  In // bars; Green theraband on floor, large step forward/backward 10x each LE  Two green therabands on floor, large step step, back, back 8x each Le Ambulate with decreased UE support ; 4x  length of// bar no UE support  Modified grapevine with UE support 4x length of // bars     PATIENT EDUCATION: Education details: POC Person educated: Patient Education method: Explanation Education comprehension: verbalized understanding  HOME EXERCISE PROGRAM: Access Code: [email or web address] URL: [email or web address] Date: 04/30/2023 Prepared by: Thresa Ross  Exercises - Standing Marching  - 1 x daily - 7 x weekly - 3 sets - 10 reps - Mini Squat with Counter Support  - 1 x daily - 7 x weekly - 3 sets - 10 reps - Single Leg Stance with Support  - 1 x daily - 7 x weekly - 3 sets - 30 sec hold - Single Leg Stance  - 1 x daily - 7 x weekly - 3 sets - 30 sec  hold  GOALS: Goals reviewed with patient? Yes  SHORT TERM GOALS: Target date: 05/23/2023     Patient will be independent in home exercise program  to improve strength/mobility for better functional independence with ADLs. Baseline: No HEP currently 6/26: HEP compliant Goal status: MET   LONG TERM GOALS: Target date: 08/21/2023   1.  Patient (> 58 years old) will complete five times sit to stand test in < 15 seconds indicating an increased LE strength and improved balance. Baseline: 21.6 sec w/ heavy UE use 6/26: 18.96 seconds with UE support  7/23:14 seconds with BUE support  Goal status: MET  2.  Patient will increase FOTO score to equal to or greater than  53   to demonstrate statistically significant improvement in mobility and quality of life.  Baseline: 46 6/26: 60% Goal status: MET    3.   Patient will reduce timed up and go to <11 seconds to reduce fall risk and demonstrate improved transfer/gait ability. Baseline: 34.8 sec with Rw 6/26:24.02 with RW 7/23: 20 seconds with RW  Goal status: Partially Met   4.   Patient will increase 10 meter walk test to >.13m/s as to improve gait speed for better community ambulation and to reduce fall risk. Baseline: .395 m/s with RW 6/26: 0.56 m/s with RW 7/23:  0.68 m/s with RW  Goal status: Partially Met  5.  Pt will improve to 100 ft or greater to indicate improved community mobility and safety accessing environment independently.  Baseline: unable to ambulate this distance at eval 6/26: 430 ft  7/23: 650 ft with RW  Goal status:Met; revised to make harder:   6. .  Patient will ambulate 30 feet or greater without assistive device and without loss of balance with his prosthesis in order to indicate improved functional mobility and less reliance on assistive device. Baseline: Ambulating with rolling walker currently 6/26:  walk in // bars with SPC 1 length 7/18: walking outside of // pars with SPC x 10 feet at a time, still unsteady and high FOF 7/23: 85 ft with [facility] with wheelchair follow  Goal status: Partially Met    ASSESSMENT:  CLINICAL IMPRESSION:  Patient presents with excellent motivation. He is able to tolerate progression of gait mechanics with SPC with head turns, negotiation of cones, and turning. Patient able to ambulate 4x length of // bars without UE support for first time.He is successfully progressing and will benefit from a new prosthesis to match his level of mobility.  Pt will continue to benefit from skilled physical therapy intervention to address impairments, improve QOL, and attain therapy goals.   OBJECTIVE IMPAIRMENTS: Abnormal gait, decreased activity tolerance, decreased balance, decreased endurance, decreased knowledge of use of DME, decreased mobility, difficulty walking, decreased strength, and hypomobility.   ACTIVITY LIMITATIONS: carrying, lifting, bending, standing, squatting, stairs, and locomotion level  PARTICIPATION LIMITATIONS: driving, shopping, community activity, yard work, and church  PERSONAL FACTORS: 1-2 comorbidities: DM, HTN, HLD   are also affecting patient's functional outcome.   REHAB POTENTIAL: Excellent  CLINICAL DECISION MAKING: Stable/uncomplicated  EVALUATION COMPLEXITY:  Low  PLAN:  PT FREQUENCY: 1-2x/week  PT DURATION: 8 weeks  PLANNED INTERVENTIONS: Therapeutic exercises, Therapeutic activity, Neuromuscular re-education, Balance training, Gait training, Patient/Family education, Self Care, Joint mobilization, Stair training, Prosthetic training, and DME instructions  PLAN FOR NEXT SESSION: progress mobility .   Precious Bard, PT 07/18/2023, 7:26 PM

## 2023-07-18 ENCOUNTER — Ambulatory Visit: Payer: Medicaid Other | Attending: Nurse Practitioner

## 2023-07-18 DIAGNOSIS — M6281 Muscle weakness (generalized): Secondary | ICD-10-CM | POA: Insufficient documentation

## 2023-07-18 DIAGNOSIS — R269 Unspecified abnormalities of gait and mobility: Secondary | ICD-10-CM | POA: Diagnosis not present

## 2023-07-18 DIAGNOSIS — R262 Difficulty in walking, not elsewhere classified: Secondary | ICD-10-CM | POA: Diagnosis not present

## 2023-07-18 DIAGNOSIS — R2689 Other abnormalities of gait and mobility: Secondary | ICD-10-CM | POA: Insufficient documentation

## 2023-07-19 NOTE — Therapy (Signed)
OUTPATIENT PHYSICAL THERAPY NEURO TREATMENT   Patient Name: [name] MRN: [id number] DOB:1966-11-15, 57 y.o., male Today's Date: 07/23/2023   PCP:   Alba Cory, MD   REFERRING PROVIDER: Georgiana Spinner, NP   END OF SESSION:  PT End of Session - 07/23/23 1016     Visit Number 18    Number of Visits 23    Date for PT Re-Evaluation 08/21/23    Authorization Type 5 left    Progress Note Due on Visit 20    PT Start Time 1015    PT Stop Time 1059    PT Time Calculation (min) 44 min    Equipment Utilized During Treatment Gait belt    Activity Tolerance Patient tolerated treatment well    Behavior During Therapy WFL for tasks assessed/performed                            Past Medical History:  Diagnosis Date   Diabetes mellitus without complication (HCC)    Hyperlipidemia    Hypertension    Past Surgical History:  Procedure Laterality Date   AMPUTATION Right 04/16/2021   Procedure: AMPUTATION  RIGHT 5TH RAY, INCISION AND DRAINAGE;  Surgeon: Felecia Shelling, DPM;  Location: [facility];  Service: Podiatry;  Laterality: Right;   AMPUTATION Right 11/24/2022   Procedure: AMPUTATION ABOVE KNEE;  Surgeon: Renford Dills, MD;  Location: [facility];  Service: Vascular;  Laterality: Right;   APPLICATION OF WOUND VAC Right 04/21/2021   Procedure: APPLICATION OF WOUND VAC;  Surgeon: Felecia Shelling, DPM;  Location: [facility];  Service: Podiatry;  Laterality: Right;   BONE BIOPSY Left 06/27/2020   Procedure: BONE BIOPSY;  Surgeon: Rosetta Posner, DPM;  Location: [facility];  Service: Podiatry;  Laterality: Left;   COLONOSCOPY WITH PROPOFOL N/A 06/22/2022   Procedure: COLONOSCOPY WITH PROPOFOL;  Surgeon: Wyline Mood, MD;  Location: [facility] ENDOSCOPY;  Service: Gastroenterology;  Laterality: N/A;   I & D EXTREMITY Right 11/22/2022   Procedure: IRRIGATION AND DEBRIDEMENT EXTREMITY, below knee amputation right leg;  Surgeon: Leafy Ro, MD;  Location: [facility];  Service:  General;  Laterality: Right;   INCISION AND DRAINAGE Left 06/27/2020   Procedure: INCISION AND DRAINAGE;  Surgeon: Rosetta Posner, DPM;  Location: [facility];  Service: Podiatry;  Laterality: Left;   IRRIGATION AND DEBRIDEMENT FOOT Right 04/21/2021   Procedure: IRRIGATION AND DEBRIDEMENT FOOT;  Surgeon: Felecia Shelling, DPM;  Location: [facility];  Service: Podiatry;  Laterality: Right;   LOWER EXTREMITY ANGIOGRAPHY Left 06/30/2020   Procedure: Lower Extremity Angiography;  Surgeon: Annice Needy, MD;  Location: [facility];  Service: Cardiovascular;  Laterality: Left;   LOWER EXTREMITY ANGIOGRAPHY Right 04/14/2021   Procedure: Lower Extremity Angiography;  Surgeon: Annice Needy, MD;  Location: [facility];  Service: Cardiovascular;  Laterality: Right;   NO PAST SURGERIES     WOUND DEBRIDEMENT Right 04/19/2021   Procedure: DEBRIDEMENT WOUND;  Surgeon: Felecia Shelling, DPM;  Location: [facility];  Service: Podiatry;  Laterality: Right;   Patient Active Problem List   Diagnosis Date Noted   History of above knee amputation, right (HCC) 12/13/2022   Elevated troponin 11/21/2022   B12 deficiency 09/05/2022   Proliferative diabetic retinopathy of both eyes without macular edema associated with type 2 diabetes mellitus (HCC) 06/05/2022   PAD (peripheral artery disease) (HCC) 04/12/2021   Secondary renal hyperparathyroidism (HCC) 03/19/2019  Dyslipidemia associated with type 2 diabetes mellitus (HCC) 07/03/2018   Vitamin D deficiency 04/11/2018   ED (erectile dysfunction) 04/10/2018   Lower urinary tract symptoms (LUTS) 02/09/2017   Hypertension 07/05/2015   Hyperglycemia due to type 2 diabetes mellitus (HCC) 07/05/2015   HLD (hyperlipidemia) 07/05/2015    ONSET DATE: 11/25/23  REFERRING DIAG: I69.629 (ICD-10-CM) - History of above knee amputation, right (HCC)   THERAPY DIAG:  Abnormality of gait and mobility  Muscle weakness (generalized)  Difficulty in walking, not elsewhere  classified  Other abnormalities of gait and mobility  Rationale for Evaluation and Treatment: Rehabilitation  SUBJECTIVE:                                                                                                                                                                                             SUBJECTIVE STATEMENT: Patient walked throughout store since last session, having compliance with walking task.     Pt accompanied by: significant other  PERTINENT HISTORY: per referring note [name] is a right transfemoral amputee.  At this time he does not have any comorbidities that would impact his mobility or his ability to function with his prosthetic.  His limb is well-healed and ready to be fit with his first prosthesis.  He verbally communicates a strong desire to get a prosthesis.  Currently he is wheelchair-bound but is expected to be able to ambulate with no assistive devices with a prosthesis.  [name] is a K2 level ambulator that spends significant amount of time walking around inside and outside his home.  He will benefit from a K2 level prosthesis with a hydraulic ankle foot for improved stability.   PAIN:  Are you having pain? No  PRECAUTIONS: Fall  WEIGHT BEARING RESTRICTIONS: No  FALLS: Has patient fallen in last 6 months? No  LIVING ENVIRONMENT: Ask visit 2   PLOF: Independent  PATIENT GOALS: Walking better, mowing yard, walk around walking track, walking on uneven terrain, balancing on uneven terrain, using equipment at the [facility]   OBJECTIVE: (objective measures completed at initial evaluation unless otherwise dated)     COGNITION: Overall cognitive status: Within functional limits for tasks assessed   COORDINATION: WNL   POSTURE: posterior pelvic tilt and seated posture in personal WC  LOWER EXTREMITY ROM:      (Blank rows = not tested)  LOWER EXTREMITY MMT:  test later date if indicated, did not want to use eval time to doff / don  prosthetic for proper testing   MMT Right Eval Left Eval  Hip flexion    Hip extension    Hip abduction  Hip adduction    Hip internal rotation    Hip external rotation    Knee flexion    Knee extension    Ankle dorsiflexion    Ankle plantarflexion    Ankle inversion    Ankle eversion    (Blank rows = not tested)  BED MOBILITY:  Pt report no problems with bed mobility at this time.   TRANSFERS: Assistive device utilized: Environmental consultant - 2 wheeled  Sit to stand: Modified independence Stand to sit: Modified independence Chair to chair: Modified independence Floor:  not assessed     STAIRS: Assess at later date if indicated   GAIT: Gait pattern: step through pattern, decreased step length- Left, decreased stance time- Right, decreased stride length, and circumduction- Right Distance walked: 30 ft Assistive device utilized: Environmental consultant - 2 wheeled used [employer] for more room for [facility].  Level of assistance: Modified independence Comments: no lob noted, see above for gait pattern  FUNCTIONAL TESTS:  5 times sit to stand: 21.68 with heavy UE  Timed up and go (TUG): 34.8 sec RW 10 meter walk test: 25.28 sec / .395 m/s  PATIENT SURVEYS:  FOTO 46  TODAY'S TREATMENT:                                                                                                                              DATE: 07/23/23   Unless otherwise stated, CGA was provided and gait belt donned in order to ensure pt safety   Therex:  Ambulate 2 x 86 ft with SPC and wheelchair follow; cues for sequencing and safe body mechanics. On incline/decline ramp.  Ambulate on treadmill 0.7 m/s for 5 minutes; cues for safety. Education on getting onto/off of machine as well as safe control of machine.   Stair negotiation: step up with LLE, step down with RLE; step to patterning  4 trials.  6" step: toe taps 15x each LE  Squat with bar 10x ; one buckling of prosthesis.    PATIENT EDUCATION: Education  details: POC Person educated: Patient Education method: Explanation Education comprehension: verbalized understanding  HOME EXERCISE PROGRAM: Access Code: [email or web address] URL: [email or web address] Date: 04/30/2023 Prepared by: Thresa Ross  Exercises - Standing Marching  - 1 x daily - 7 x weekly - 3 sets - 10 reps - Mini Squat with Counter Support  - 1 x daily - 7 x weekly - 3 sets - 10 reps - Single Leg Stance with Support  - 1 x daily - 7 x weekly - 3 sets - 30 sec hold - Single Leg Stance  - 1 x daily - 7 x weekly - 3 sets - 30 sec  hold  GOALS: Goals reviewed with patient? Yes  SHORT TERM GOALS: Target date: 05/23/2023     Patient will be independent in home exercise program to improve strength/mobility for better functional independence with ADLs. Baseline: No HEP currently 6/26: HEP compliant Goal status: MET   LONG TERM  GOALS: Target date: 08/21/2023   1.  Patient (> 22 years old) will complete five times sit to stand test in < 15 seconds indicating an increased LE strength and improved balance. Baseline: 21.6 sec w/ heavy UE use 6/26: 18.96 seconds with UE support  7/23:14 seconds with BUE support  Goal status: MET  2.  Patient will increase FOTO score to equal to or greater than  53   to demonstrate statistically significant improvement in mobility and quality of life.  Baseline: 46 6/26: 60% Goal status: MET    3.   Patient will reduce timed up and go to <11 seconds to reduce fall risk and demonstrate improved transfer/gait ability. Baseline: 34.8 sec with Rw 6/26:24.02 with RW 7/23: 20 seconds with RW  Goal status: Partially Met   4.   Patient will increase 10 meter walk test to >.48m/s as to improve gait speed for better community ambulation and to reduce fall risk. Baseline: .395 m/s with RW 6/26: 0.56 m/s with RW 7/23: 0.68 m/s with RW  Goal status: Partially Met  5.  Pt will improve to 100 ft or greater to indicate improved community  mobility and safety accessing environment independently.  Baseline: unable to ambulate this distance at eval 6/26: 430 ft  7/23: 650 ft with RW  Goal status:Met; revised to make harder:   6. .  Patient will ambulate 30 feet or greater without assistive device and without loss of balance with his prosthesis in order to indicate improved functional mobility and less reliance on assistive device. Baseline: Ambulating with rolling walker currently 6/26:  walk in // bars with SPC 1 length 7/18: walking outside of // pars with SPC x 10 feet at a time, still unsteady and high FOF 7/23: 85 ft with [facility] with wheelchair follow  Goal status: Partially Met    ASSESSMENT:  CLINICAL IMPRESSION: Patient introduced to ramp negotiation with [facility] and wheelchair follow. He is able to perform with CGA and no LOB. Cues for sequencing and safety required. Patient eager to progress functional mobility, wants to return to gym so is educated on safe getting onto/off of treadmill.  Pt will continue to benefit from skilled physical therapy intervention to address impairments, improve QOL, and attain therapy goals.   OBJECTIVE IMPAIRMENTS: Abnormal gait, decreased activity tolerance, decreased balance, decreased endurance, decreased knowledge of use of DME, decreased mobility, difficulty walking, decreased strength, and hypomobility.   ACTIVITY LIMITATIONS: carrying, lifting, bending, standing, squatting, stairs, and locomotion level  PARTICIPATION LIMITATIONS: driving, shopping, community activity, yard work, and church  PERSONAL FACTORS: 1-2 comorbidities: DM, HTN, HLD   are also affecting patient's functional outcome.   REHAB POTENTIAL: Excellent  CLINICAL DECISION MAKING: Stable/uncomplicated  EVALUATION COMPLEXITY: Low  PLAN:  PT FREQUENCY: 1-2x/week  PT DURATION: 8 weeks  PLANNED INTERVENTIONS: Therapeutic exercises, Therapeutic activity, Neuromuscular re-education, Balance training, Gait training,  Patient/Family education, Self Care, Joint mobilization, Stair training, Prosthetic training, and DME instructions  PLAN FOR NEXT SESSION: progress mobility .   Precious Bard, PT 07/23/2023, 11:49 AM

## 2023-07-23 ENCOUNTER — Ambulatory Visit: Payer: Medicaid Other

## 2023-07-23 DIAGNOSIS — M6281 Muscle weakness (generalized): Secondary | ICD-10-CM

## 2023-07-23 DIAGNOSIS — R269 Unspecified abnormalities of gait and mobility: Secondary | ICD-10-CM

## 2023-07-23 DIAGNOSIS — R2689 Other abnormalities of gait and mobility: Secondary | ICD-10-CM

## 2023-07-23 DIAGNOSIS — R262 Difficulty in walking, not elsewhere classified: Secondary | ICD-10-CM

## 2023-07-27 ENCOUNTER — Encounter: Payer: Medicaid Other | Admitting: Physical Therapy

## 2023-07-30 DIAGNOSIS — H25811 Combined forms of age-related cataract, right eye: Secondary | ICD-10-CM | POA: Diagnosis not present

## 2023-07-30 DIAGNOSIS — H52201 Unspecified astigmatism, right eye: Secondary | ICD-10-CM | POA: Diagnosis not present

## 2023-07-30 DIAGNOSIS — D509 Iron deficiency anemia, unspecified: Secondary | ICD-10-CM | POA: Diagnosis not present

## 2023-07-30 DIAGNOSIS — E1152 Type 2 diabetes mellitus with diabetic peripheral angiopathy with gangrene: Secondary | ICD-10-CM | POA: Diagnosis not present

## 2023-07-30 DIAGNOSIS — I10 Essential (primary) hypertension: Secondary | ICD-10-CM | POA: Diagnosis not present

## 2023-07-30 DIAGNOSIS — E113593 Type 2 diabetes mellitus with proliferative diabetic retinopathy without macular edema, bilateral: Secondary | ICD-10-CM | POA: Diagnosis not present

## 2023-07-30 DIAGNOSIS — Z794 Long term (current) use of insulin: Secondary | ICD-10-CM | POA: Diagnosis not present

## 2023-07-31 DIAGNOSIS — H2512 Age-related nuclear cataract, left eye: Secondary | ICD-10-CM | POA: Diagnosis not present

## 2023-07-31 NOTE — Therapy (Signed)
OUTPATIENT PHYSICAL THERAPY NEURO TREATMENT   Patient Name: [name] MRN: [id number] DOB:1966-04-15, 57 y.o., male Today's Date: 08/01/2023   PCP:   Alba Cory, MD   REFERRING PROVIDER: Georgiana Spinner, NP   END OF SESSION:  PT End of Session - 08/01/23 1400     Visit Number 19    Number of Visits 23    Date for PT Re-Evaluation 08/21/23    Authorization Type 4 left    Progress Note Due on Visit 20    PT Start Time 1400    PT Stop Time 1444    PT Time Calculation (min) 44 min    Equipment Utilized During Treatment Gait belt    Activity Tolerance Patient tolerated treatment well    Behavior During Therapy WFL for tasks assessed/performed                             Past Medical History:  Diagnosis Date   Diabetes mellitus without complication (HCC)    Hyperlipidemia    Hypertension    Past Surgical History:  Procedure Laterality Date   AMPUTATION Right 04/16/2021   Procedure: AMPUTATION  RIGHT 5TH RAY, INCISION AND DRAINAGE;  Surgeon: Felecia Shelling, DPM;  Location: [facility];  Service: Podiatry;  Laterality: Right;   AMPUTATION Right 11/24/2022   Procedure: AMPUTATION ABOVE KNEE;  Surgeon: Renford Dills, MD;  Location: [facility];  Service: Vascular;  Laterality: Right;   APPLICATION OF WOUND VAC Right 04/21/2021   Procedure: APPLICATION OF WOUND VAC;  Surgeon: Felecia Shelling, DPM;  Location: [facility];  Service: Podiatry;  Laterality: Right;   BONE BIOPSY Left 06/27/2020   Procedure: BONE BIOPSY;  Surgeon: Rosetta Posner, DPM;  Location: [facility];  Service: Podiatry;  Laterality: Left;   COLONOSCOPY WITH PROPOFOL N/A 06/22/2022   Procedure: COLONOSCOPY WITH PROPOFOL;  Surgeon: Wyline Mood, MD;  Location: [facility] ENDOSCOPY;  Service: Gastroenterology;  Laterality: N/A;   I & D EXTREMITY Right 11/22/2022   Procedure: IRRIGATION AND DEBRIDEMENT EXTREMITY, below knee amputation right leg;  Surgeon: Leafy Ro, MD;  Location: [facility];  Service:  General;  Laterality: Right;   INCISION AND DRAINAGE Left 06/27/2020   Procedure: INCISION AND DRAINAGE;  Surgeon: Rosetta Posner, DPM;  Location: [facility];  Service: Podiatry;  Laterality: Left;   IRRIGATION AND DEBRIDEMENT FOOT Right 04/21/2021   Procedure: IRRIGATION AND DEBRIDEMENT FOOT;  Surgeon: Felecia Shelling, DPM;  Location: [facility];  Service: Podiatry;  Laterality: Right;   LOWER EXTREMITY ANGIOGRAPHY Left 06/30/2020   Procedure: Lower Extremity Angiography;  Surgeon: Annice Needy, MD;  Location: [facility] IN[facility];  Service: Cardiovascular;  Laterality: Left;   LOWER EXTREMITY ANGIOGRAPHY Right 04/14/2021   Procedure: Lower Extremity Angiography;  Surgeon: Annice Needy, MD;  Location: [facility];  Service: Cardiovascular;  Laterality: Right;   NO PAST SURGERIES     WOUND DEBRIDEMENT Right 04/19/2021   Procedure: DEBRIDEMENT WOUND;  Surgeon: Felecia Shelling, DPM;  Location: [facility];  Service: Podiatry;  Laterality: Right;   Patient Active Problem List   Diagnosis Date Noted   History of above knee amputation, right (HCC) 12/13/2022   Elevated troponin 11/21/2022   B12 deficiency 09/05/2022   Proliferative diabetic retinopathy of both eyes without macular edema associated with type 2 diabetes mellitus (HCC) 06/05/2022   PAD (peripheral artery disease) (HCC) 04/12/2021   Secondary renal hyperparathyroidism (HCC) 03/19/2019  Dyslipidemia associated with type 2 diabetes mellitus (HCC) 07/03/2018   Vitamin D deficiency 04/11/2018   ED (erectile dysfunction) 04/10/2018   Lower urinary tract symptoms (LUTS) 02/09/2017   Hypertension 07/05/2015   Hyperglycemia due to type 2 diabetes mellitus (HCC) 07/05/2015   HLD (hyperlipidemia) 07/05/2015    ONSET DATE: 11/25/23  REFERRING DIAG: W11.914 (ICD-10-CM) - History of above knee amputation, right (HCC)   THERAPY DIAG:  Abnormality of gait and mobility  Muscle weakness (generalized)  Difficulty in walking, not elsewhere  classified  Other abnormalities of gait and mobility  Rationale for Evaluation and Treatment: Rehabilitation  SUBJECTIVE:                                                                                                                                                                                             SUBJECTIVE STATEMENT: Patient had cataract surgery on Monday.     Pt accompanied by: significant other  PERTINENT HISTORY: per referring note [name] is a right transfemoral amputee.  At this time he does not have any comorbidities that would impact his mobility or his ability to function with his prosthetic.  His limb is well-healed and ready to be fit with his first prosthesis.  He verbally communicates a strong desire to get a prosthesis.  Currently he is wheelchair-bound but is expected to be able to ambulate with no assistive devices with a prosthesis.  [name] is a K2 level ambulator that spends significant amount of time walking around inside and outside his home.  He will benefit from a K2 level prosthesis with a hydraulic ankle foot for improved stability.   PAIN:  Are you having pain? No  PRECAUTIONS: Fall  WEIGHT BEARING RESTRICTIONS: No  FALLS: Has patient fallen in last 6 months? No  LIVING ENVIRONMENT: Ask visit 2   PLOF: Independent  PATIENT GOALS: Walking better, mowing yard, walk around walking track, walking on uneven terrain, balancing on uneven terrain, using equipment at the [facility]   OBJECTIVE: (objective measures completed at initial evaluation unless otherwise dated)     COGNITION: Overall cognitive status: Within functional limits for tasks assessed   COORDINATION: WNL   POSTURE: posterior pelvic tilt and seated posture in personal WC  LOWER EXTREMITY ROM:      (Blank rows = not tested)  LOWER EXTREMITY MMT:  test later date if indicated, did not want to use eval time to doff / don prosthetic for proper testing   MMT Right Eval  Left Eval  Hip flexion    Hip extension    Hip abduction    Hip adduction  Hip internal rotation    Hip external rotation    Knee flexion    Knee extension    Ankle dorsiflexion    Ankle plantarflexion    Ankle inversion    Ankle eversion    (Blank rows = not tested)  BED MOBILITY:  Pt report no problems with bed mobility at this time.   TRANSFERS: Assistive device utilized: Environmental consultant - 2 wheeled  Sit to stand: Modified independence Stand to sit: Modified independence Chair to chair: Modified independence Floor:  not assessed     STAIRS: Assess at later date if indicated   GAIT: Gait pattern: step through pattern, decreased step length- Left, decreased stance time- Right, decreased stride length, and circumduction- Right Distance walked: 30 ft Assistive device utilized: Environmental consultant - 2 wheeled used [employer] for more room for [employer].  Level of assistance: Modified independence Comments: no lob noted, see above for gait pattern  FUNCTIONAL TESTS:  5 times sit to stand: 21.68 with heavy UE  Timed up and go (TUG): 34.8 sec RW 10 meter walk test: 25.28 sec / .395 m/s  PATIENT SURVEYS:  FOTO 46  TODAY'S TREATMENT:                                                                                                                              DATE: 08/01/23   Unless otherwise stated, CGA was provided and gait belt donned in order to ensure pt safety   Therex: Ambulate with SPC; outside with changing surfaces, inclines/declines, wheelchair follow. Cues for foot positioning, safety with head position/head turns to scan landscape. Two seated rest breaks.  X 32 minutes   Neuro Re-ed: Airex pad dual task sorting letters in alphabetical order x 8 minutes    PATIENT EDUCATION: Education details: POC Person educated: Patient Education method: Explanation Education comprehension: verbalized understanding  HOME EXERCISE PROGRAM: Access Code: [email or web address] URL:  [email or web address] Date: 04/30/2023 Prepared by: Thresa Ross  Exercises - Standing Marching  - 1 x daily - 7 x weekly - 3 sets - 10 reps - Mini Squat with Counter Support  - 1 x daily - 7 x weekly - 3 sets - 10 reps - Single Leg Stance with Support  - 1 x daily - 7 x weekly - 3 sets - 30 sec hold - Single Leg Stance  - 1 x daily - 7 x weekly - 3 sets - 30 sec  hold  GOALS: Goals reviewed with patient? Yes  SHORT TERM GOALS: Target date: 05/23/2023     Patient will be independent in home exercise program to improve strength/mobility for better functional independence with ADLs. Baseline: No HEP currently 6/26: HEP compliant Goal status: MET   LONG TERM GOALS: Target date: 08/21/2023   1.  Patient (> 2 years old) will complete five times sit to stand test in < 15 seconds indicating an increased LE strength and improved balance. Baseline: 21.6 sec w/ heavy UE use 6/26:  18.96 seconds with UE support  7/23:14 seconds with BUE support  Goal status: MET  2.  Patient will increase FOTO score to equal to or greater than  53   to demonstrate statistically significant improvement in mobility and quality of life.  Baseline: 46 6/26: 60% Goal status: MET    3.   Patient will reduce timed up and go to <11 seconds to reduce fall risk and demonstrate improved transfer/gait ability. Baseline: 34.8 sec with Rw 6/26:24.02 with RW 7/23: 20 seconds with RW  Goal status: Partially Met   4.   Patient will increase 10 meter walk test to >.28m/s as to improve gait speed for better community ambulation and to reduce fall risk. Baseline: .395 m/s with RW 6/26: 0.56 m/s with RW 7/23: 0.68 m/s with RW  Goal status: Partially Met  5.  Pt will improve to 100 ft or greater to indicate improved community mobility and safety accessing environment independently.  Baseline: unable to ambulate this distance at eval 6/26: 430 ft  7/23: 650 ft with RW  Goal status:Met; revised to make  harder:   6. .  Patient will ambulate 30 feet or greater without assistive device and without loss of balance with his prosthesis in order to indicate improved functional mobility and less reliance on assistive device. Baseline: Ambulating with rolling walker currently 6/26:  walk in // bars with SPC 1 length 7/18: walking outside of // pars with SPC x 10 feet at a time, still unsteady and high FOF 7/23: 85 ft with [facility] with wheelchair follow  Goal status: Partially Met    ASSESSMENT:  CLINICAL IMPRESSION: Patient introduced to ambulation outside with [facility] for first time with wheelchair follow. He is able to negotiate changing surfaces with inclines and declines with CGA and no LOB.  He remains highly motivated and has excellent prognosis at this time.  Pt will continue to benefit from skilled physical therapy intervention to address impairments, improve QOL, and attain therapy goals.   OBJECTIVE IMPAIRMENTS: Abnormal gait, decreased activity tolerance, decreased balance, decreased endurance, decreased knowledge of use of DME, decreased mobility, difficulty walking, decreased strength, and hypomobility.   ACTIVITY LIMITATIONS: carrying, lifting, bending, standing, squatting, stairs, and locomotion level  PARTICIPATION LIMITATIONS: driving, shopping, community activity, yard work, and church  PERSONAL FACTORS: 1-2 comorbidities: DM, HTN, HLD   are also affecting patient's functional outcome.   REHAB POTENTIAL: Excellent  CLINICAL DECISION MAKING: Stable/uncomplicated  EVALUATION COMPLEXITY: Low  PLAN:  PT FREQUENCY: 1-2x/week  PT DURATION: 8 weeks  PLANNED INTERVENTIONS: Therapeutic exercises, Therapeutic activity, Neuromuscular re-education, Balance training, Gait training, Patient/Family education, Self Care, Joint mobilization, Stair training, Prosthetic training, and DME instructions  PLAN FOR NEXT SESSION: progress mobility .   Precious Bard, PT 08/01/2023, 3:21 PM

## 2023-08-01 ENCOUNTER — Ambulatory Visit: Payer: Medicaid Other

## 2023-08-01 DIAGNOSIS — R262 Difficulty in walking, not elsewhere classified: Secondary | ICD-10-CM | POA: Diagnosis not present

## 2023-08-01 DIAGNOSIS — R2689 Other abnormalities of gait and mobility: Secondary | ICD-10-CM

## 2023-08-01 DIAGNOSIS — M6281 Muscle weakness (generalized): Secondary | ICD-10-CM | POA: Diagnosis not present

## 2023-08-01 DIAGNOSIS — R269 Unspecified abnormalities of gait and mobility: Secondary | ICD-10-CM

## 2023-08-06 ENCOUNTER — Encounter: Payer: Medicaid Other | Admitting: Physical Therapy

## 2023-08-06 DIAGNOSIS — I10 Essential (primary) hypertension: Secondary | ICD-10-CM | POA: Diagnosis not present

## 2023-08-06 DIAGNOSIS — H25812 Combined forms of age-related cataract, left eye: Secondary | ICD-10-CM | POA: Diagnosis not present

## 2023-08-06 DIAGNOSIS — H52202 Unspecified astigmatism, left eye: Secondary | ICD-10-CM | POA: Diagnosis not present

## 2023-08-06 DIAGNOSIS — E1151 Type 2 diabetes mellitus with diabetic peripheral angiopathy without gangrene: Secondary | ICD-10-CM | POA: Diagnosis not present

## 2023-08-06 DIAGNOSIS — E113593 Type 2 diabetes mellitus with proliferative diabetic retinopathy without macular edema, bilateral: Secondary | ICD-10-CM | POA: Diagnosis not present

## 2023-08-08 ENCOUNTER — Ambulatory Visit: Payer: Medicaid Other

## 2023-08-09 ENCOUNTER — Ambulatory Visit: Payer: Self-pay

## 2023-08-09 NOTE — Telephone Encounter (Signed)
  Chief Complaint: Constipation 3 days. Symptoms: no bm 3 days Frequency: 3 days Pertinent Negatives: Patient denies bloating, pain Disposition: [] ED /[] Urgent Care (no appt availability in office) / [] Appointment(In office/virtual)/ []  [facility]/ [] Home Care/ [x] Refused Recommended Disposition /[] [facility] [facility]/ []  Follow-up with PCP Additional Notes: Pt states that he has not had a BM for 3 days. Stools are hard. Pt believes this may be a result of iron tablets. Pt has stopped taking them.  Reviewed home care with pt. Pt wants Dr. Carlynn Purl to call in something. He does not think that OTC medications will be helpful.   Please advise.    Summary: Constipation, seeking medication   Pt called requesting medication for constipation, symptoms have lasted for 3 days.  Best contact: [phone]     Reason for Disposition  Taking new prescription medication  Answer Assessment - Initial Assessment Questions 1. STOOL PATTERN OR FREQUENCY: "How often do you have a bowel movement (BM)?"  (Normal range: 3 times a day to every 3 days)  "When was your last BM?"       3 days 2. STRAINING: "Do you have to strain to have a BM?"      A little 3. RECTAL PAIN: "Does your rectum hurt when the stool comes out?" If Yes, ask: "Do you have hemorrhoids? How bad is the pain?"  (Scale 1-10; or mild, moderate, severe)     no 4. STOOL COMPOSITION: "Are the stools hard?"      yes  6. CHRONIC CONSTIPATION: "Is this a new problem for you?"  If No, ask: "How long have you had this problem?" (days, weeks, months)      3 days 7. CHANGES IN DIET OR HYDRATION: "Have there been any recent changes in your diet?" "How much fluids are you drinking on a daily basis?"  "How much have you had to drink today?"     No - drinking a lot of fluids 9. LAXATIVES: "Have you been using any stool softeners, laxatives, or enemas?"  If Yes, ask "What, how often, and when was the last time?"     no 10. ACTIVITY:   "How much walking do you do every day?"  "Has your activity level decreased in the past week?"        Decreased activity level 11. CAUSE: "What do you think is causing the constipation?"        Iron pills 12. OTHER SYMPTOMS: "Do you have any other symptoms?" (e.g., abdomen pain, bloating, fever, vomiting)       none  Protocols used: Constipation-A-AH

## 2023-08-14 NOTE — Telephone Encounter (Signed)
Copied from CRM [id number]. Topic: General - Inquiry >> Aug 10, 2023  5:06 PM CMA Fermin Schwab wrote: Reason for CRM: Please advise patient per Dr. Carlynn Purl if he returns call reguarding abdominal pain-- OTC miralax/Colace and if abdominal pain, fever or no relief, have him seek urgent care. Thanks! >> Aug 14, 2023 12:51 PM Reeves Forth wrote: Please ensure the actual documentation is up to date and route to nurse triage pool if needed. Nurse Triage pool is unable to receive CRMs.

## 2023-08-25 ENCOUNTER — Other Ambulatory Visit: Payer: Self-pay | Admitting: Family Medicine

## 2023-08-25 DIAGNOSIS — E1169 Type 2 diabetes mellitus with other specified complication: Secondary | ICD-10-CM

## 2023-09-14 NOTE — Progress Notes (Unsigned)
Name: [name]   MRN: [id number]    DOB: August 28, 1966   Date:09/17/2023       Progress Note  Subjective  Chief Complaint  Follow Up  HPI  Dyslipidemia: taking Crestor daily, Last LDL was above goal again at 95. Goal for him is below 70. He refuses to add zetia at this time  History of gas gangrene : s/p right AKA done 11/22/2022.  He has a prosthesis now and is doing well    Uncontrolled HTN: currently taking valsartan hctz 320/25 mg daily and norvasc 10 mg, his bp used to be well controlled when he came in June, however he states his bp was high during his eye procedure and is high today. He states in the mornings his bp has been elevated every morning . He is due for a visit with nephrologist. We will add hydralazine for now to take prn   DMII: A1C is at goal now, we will go down on Glipizide from 5 mg to 2.5 mg daily, he states glucose has been controlled since he changed his diet. He is taking Jardiance, Rybelsus and Glipizide. He denies polyphagia, polydipsia or polyuria. He has associated PAD, diabetic retinopathy, dyslipidemia and microalbuminuria. He is on statin therapy, ARB and aspirin daily   B12 and vitamin D deficiency : continue supplementation   Patient Active Problem List   Diagnosis Date Noted   History of above knee amputation, right (HCC) 12/13/2022   Elevated troponin 11/21/2022   B12 deficiency 09/05/2022   Proliferative diabetic retinopathy of both eyes without macular edema associated with type 2 diabetes mellitus (HCC) 06/05/2022   PAD (peripheral artery disease) (HCC) 04/12/2021   Secondary renal hyperparathyroidism (HCC) 03/19/2019   Dyslipidemia associated with type 2 diabetes mellitus (HCC) 07/03/2018   Vitamin D deficiency 04/11/2018   ED (erectile dysfunction) 04/10/2018   Lower urinary tract symptoms (LUTS) 02/09/2017   Hypertension 07/05/2015   Hyperglycemia due to type 2 diabetes mellitus (HCC) 07/05/2015   HLD (hyperlipidemia) 07/05/2015     Past Surgical History:  Procedure Laterality Date   AMPUTATION Right 04/16/2021   Procedure: AMPUTATION  RIGHT 5TH RAY, INCISION AND DRAINAGE;  Surgeon: Felecia Shelling, DPM;  Location: [facility];  Service: Podiatry;  Laterality: Right;   AMPUTATION Right 11/24/2022   Procedure: AMPUTATION ABOVE KNEE;  Surgeon: Renford Dills, MD;  Location: [facility];  Service: Vascular;  Laterality: Right;   APPLICATION OF WOUND VAC Right 04/21/2021   Procedure: APPLICATION OF WOUND VAC;  Surgeon: Felecia Shelling, DPM;  Location: [facility];  Service: Podiatry;  Laterality: Right;   BONE BIOPSY Left 06/27/2020   Procedure: BONE BIOPSY;  Surgeon: Rosetta Posner, DPM;  Location: [facility];  Service: Podiatry;  Laterality: Left;   COLONOSCOPY WITH PROPOFOL N/A 06/22/2022   Procedure: COLONOSCOPY WITH PROPOFOL;  Surgeon: Wyline Mood, MD;  Location[facility] ENDOSCOPY;  Service: Gastroenterology;  Laterality: N/A;   I & D EXTREMITY Right 11/22/2022   Procedure: IRRIGATION AND DEBRIDEMENT EXTREMITY, below knee amputation right leg;  Surgeon: Leafy Ro, MD;  Location: [facility];  Service: General;  Laterality: Right;   INCISION AND DRAINAGE Left 06/27/2020   Procedure: INCISION AND DRAINAGE;  Surgeon: Rosetta Posner, DPM;  Location: [facility];  Service: Podiatry;  Laterality: Left;   IRRIGATION AND DEBRIDEMENT FOOT Right 04/21/2021   Procedure: IRRIGATION AND DEBRIDEMENT FOOT;  Surgeon: Felecia Shelling, DPM;  Location: [facility];  Service: Podiatry;  Laterality: Right;   LOWER EXTREMITY ANGIOGRAPHY Left  06/30/2020   Procedure: Lower Extremity Angiography;  Surgeon: Annice Needy, MD;  Location: [facility] INVASIVE CV LAB;  Service: Cardiovascular;  Laterality: Left;   LOWER EXTREMITY ANGIOGRAPHY Right 04/14/2021   Procedure: Lower Extremity Angiography;  Surgeon: Annice Needy, MD;  Location: [facility];  Service: Cardiovascular;  Laterality: Right;   NO PAST SURGERIES     WOUND DEBRIDEMENT Right 04/19/2021   Procedure:  DEBRIDEMENT WOUND;  Surgeon: Felecia Shelling, DPM;  Location: [facility] OR[facility];  Service: Podiatry;  Laterality: Right;    Family History  Problem Relation Age of Onset   Heart disease Father    Heart attack Father    Healthy Mother    Prostate cancer Neg Hx    Kidney cancer Neg Hx    Bladder Cancer Neg Hx     Social History   Tobacco Use   Smoking status: Never   Smokeless tobacco: Never  Substance Use Topics   Alcohol use: Yes    Alcohol/week: 6.0 - 12.0 standard drinks of alcohol    Types: 6 - 12 Cans of beer per week    Comment: Occasional     Current Outpatient Medications:    ACCU-CHEK GUIDE test strip, 1 EACH BY OTHER ROUTE IN THE MORNING AND AT BEDTIME. USE AS INSTRUCTED, Disp: 200 strip, Rfl: 0   Accu-Chek Softclix Lancets lancets, USE TO TEST BLOOD SUGAR UP TO FOUR TIMES DAILY AS DIRECTED, Disp: 200 each, Rfl: 1   amLODipine (NORVASC) 10 MG tablet, Take 1 tablet (10 mg total) by mouth every evening., Disp: 90 tablet, Rfl: 1   Cholecalciferol (VITAMIN D3) 50 MCG (2000 UT) CAPS, Take 1 capsule (2,000 Units total) by mouth daily., Disp: 90 capsule, Rfl: 1   cyanocobalamin (VITAMIN B12) 1000 MCG tablet, Take 1 tablet (1,000 mcg total) by mouth daily., Disp: 90 tablet, Rfl: 1   empagliflozin (JARDIANCE) 25 MG TABS tablet, Take 1 tablet (25 mg total) by mouth daily., Disp: 90 tablet, Rfl: 1   glipiZIDE (GLUCOTROL XL) 5 MG 24 hr tablet, Take 1 tablet (5 mg total) by mouth daily with breakfast., Disp: 90 tablet, Rfl: 1   Iron, Ferrous Sulfate, 325 (65 Fe) MG TABS, Take 325 mg by mouth daily., Disp: 90 tablet, Rfl: 1   rosuvastatin (CRESTOR) 40 MG tablet, TAKE 1 TABLET(40 MG) BY MOUTH DAILY, Disp: 90 tablet, Rfl: 1   Semaglutide (RYBELSUS) 14 MG TABS, Take 1 tablet (14 mg total) by mouth daily., Disp: 90 tablet, Rfl: 1   valsartan-hydrochlorothiazide (DIOVAN-HCT) 320-25 MG tablet, Take 1 tablet by mouth daily., Disp: 90 tablet, Rfl: 1  No Known Allergies  I personally reviewed active  problem list, medication list, allergies, family history, social history, health maintenance with the patient/caregiver today.   ROS  Ten systems reviewed and is negative except as mentioned in HPI    Objective  Vitals:   09/17/23 1415 09/17/23 1428  BP: (!) 180/94 (!) 172/90  Pulse: 60   Resp: 16   SpO2: 98%   Weight: 189 lb (85.7 kg)   Height: 5\' 11"  (1.803 m)     Body mass index is 26.36 kg/m.  Physical Exam  Constitutional: Patient appears well-developed and well-nourished.  No distress.  HEENT: head atraumatic, normocephalic, pupils equal and reactive to light, neck supple Cardiovascular: Normal rate, regular rhythm and normal heart sounds.  No murmur heard. No BLE edema. Pulmonary/Chest: Effort normal and breath sounds normal. No respiratory distress. Muscular skeletal: right AKA  Abdominal: Soft.  There is no tenderness. Psychiatric: Patient has a normal mood and affect. behavior is normal. Judgment and thought content normal.   Recent Results (from the past 2160 hour(s))  POCT HgB A1C     Status: Abnormal   Collection Time: 09/17/23  2:28 PM  Result Value Ref Range   Hemoglobin A1C 5.9 (A) 4.0 - 5.6 %   HbA1c POC (<> result, manual entry)     HbA1c, POC (prediabetic range)     HbA1c, POC (controlled diabetic range)       PHQ2/9:    09/17/2023    2:21 PM 06/04/2023    2:54 PM 02/26/2023    3:08 PM 12/13/2022    3:29 PM 12/08/2022   11:14 AM  Depression screen PHQ 2/9  Decreased Interest 0 0 0 0 0  Down, Depressed, Hopeless 0 0 0 0 0  PHQ - 2 Score 0 0 0 0 0  Altered sleeping 0 0 0 0 0  Tired, decreased energy 0 0 0 0 0  Change in appetite 0 0 0 0 0  Feeling bad or failure about yourself  0 0 0 0 0  Trouble concentrating 0 0 0 0 0  Moving slowly or fidgety/restless 0 0 0 0 0  Suicidal thoughts 0 0 0 0 0  PHQ-9 Score 0 0 0 0 0    phq 9 is negative   Fall Risk:    09/17/2023    2:21 PM 06/04/2023    2:53 PM 02/26/2023    3:08 PM 12/13/2022    3:29 PM  12/08/2022   11:14 AM  Fall Risk   Falls in the past year? 0 0 0 1 1  Number falls in past yr: 0 0  1 1  Injury with Fall? 0 0  0 0  Risk for fall due to : Impaired balance/gait Impaired mobility;Impaired balance/gait Impaired mobility Impaired balance/gait;Impaired mobility History of fall(s);Impaired mobility;Impaired balance/gait  Follow up Falls prevention discussed Falls prevention discussed Falls prevention discussed;Education provided;Falls evaluation completed Falls prevention discussed Falls prevention discussed      Functional Status Survey: Is the patient deaf or have difficulty hearing?: No Does the patient have difficulty seeing, even when wearing glasses/contacts?: No Does the patient have difficulty concentrating, remembering, or making decisions?: No Does the patient have difficulty walking or climbing stairs?: Yes Does the patient have difficulty dressing or bathing?: No Does the patient have difficulty doing errands alone such as visiting a doctor's office or shopping?: No    Assessment & Plan  1. Dyslipidemia associated with type 2 diabetes mellitus (HCC)  - POCT HgB A1C - glipiZIDE (GLUCOTROL XL) 2.5 MG 24 hr tablet; Take 1 tablet (2.5 mg total) by mouth daily with breakfast.  Dispense: 90 tablet; Refill: 1  2. Secondary renal hyperparathyroidism (HCC)  Needs to see nephrologist   3. PAD (peripheral artery disease) (HCC)  Under the care of vascular surgeon   4. B12 deficiency  Continue supplementation   5. Vitamin D deficiency  Continue supplementation   6. Uncontrolled hypertension  - hydrALAZINE (APRESOLINE) 10 MG tablet; Take 1 tablet (10 mg total) by mouth 2 (two) times daily. If bp above 140/90  Dispense: 180 tablet; Refill: 0   He refuses to return in a couple of weeks for bp recheck but states he will monitor at home and send me the values.

## 2023-09-17 ENCOUNTER — Ambulatory Visit (INDEPENDENT_AMBULATORY_CARE_PROVIDER_SITE_OTHER): Payer: Medicaid Other | Admitting: Family Medicine

## 2023-09-17 ENCOUNTER — Encounter: Payer: Self-pay | Admitting: Family Medicine

## 2023-09-17 VITALS — BP 172/90 | HR 60 | Resp 16 | Ht 71.0 in | Wt 189.0 lb

## 2023-09-17 DIAGNOSIS — E1169 Type 2 diabetes mellitus with other specified complication: Secondary | ICD-10-CM

## 2023-09-17 DIAGNOSIS — I10 Essential (primary) hypertension: Secondary | ICD-10-CM

## 2023-09-17 DIAGNOSIS — E785 Hyperlipidemia, unspecified: Secondary | ICD-10-CM

## 2023-09-17 DIAGNOSIS — E538 Deficiency of other specified B group vitamins: Secondary | ICD-10-CM | POA: Diagnosis not present

## 2023-09-17 DIAGNOSIS — N2581 Secondary hyperparathyroidism of renal origin: Secondary | ICD-10-CM

## 2023-09-17 DIAGNOSIS — E559 Vitamin D deficiency, unspecified: Secondary | ICD-10-CM

## 2023-09-17 DIAGNOSIS — Z7984 Long term (current) use of oral hypoglycemic drugs: Secondary | ICD-10-CM | POA: Diagnosis not present

## 2023-09-17 DIAGNOSIS — I739 Peripheral vascular disease, unspecified: Secondary | ICD-10-CM

## 2023-09-17 LAB — POCT GLYCOSYLATED HEMOGLOBIN (HGB A1C): Hemoglobin A1C: 5.9 % — AB (ref 4.0–5.6)

## 2023-09-17 MED ORDER — HYDRALAZINE HCL 10 MG PO TABS: 10.0000 mg | ORAL_TABLET | Freq: Two times a day (BID) | ORAL | 0 refills | Status: DC

## 2023-09-17 MED ORDER — GLIPIZIDE ER 2.5 MG PO TB24
2.5000 mg | ORAL_TABLET | Freq: Every day | ORAL | 1 refills | Status: DC
Start: 2023-09-17 — End: 2024-03-10

## 2023-10-01 ENCOUNTER — Telehealth (INDEPENDENT_AMBULATORY_CARE_PROVIDER_SITE_OTHER): Payer: Self-pay

## 2023-10-01 NOTE — Telephone Encounter (Signed)
Patients wife called to see if the CAP forms have been filled out.

## 2023-10-01 NOTE — Telephone Encounter (Signed)
Delice Bison stated that Vivia Birmingham can just fill out the medical part and fax it back. She's not worried about the rest of the form.

## 2023-10-03 NOTE — Telephone Encounter (Signed)
Done

## 2023-10-04 NOTE — Telephone Encounter (Signed)
Patient coming in to finish filling out the forms today

## 2023-10-18 DIAGNOSIS — E1122 Type 2 diabetes mellitus with diabetic chronic kidney disease: Secondary | ICD-10-CM | POA: Diagnosis not present

## 2023-10-18 DIAGNOSIS — N181 Chronic kidney disease, stage 1: Secondary | ICD-10-CM | POA: Diagnosis not present

## 2023-10-18 DIAGNOSIS — R801 Persistent proteinuria, unspecified: Secondary | ICD-10-CM | POA: Diagnosis not present

## 2023-10-19 DIAGNOSIS — H26493 Other secondary cataract, bilateral: Secondary | ICD-10-CM | POA: Diagnosis not present

## 2023-10-19 DIAGNOSIS — E113513 Type 2 diabetes mellitus with proliferative diabetic retinopathy with macular edema, bilateral: Secondary | ICD-10-CM | POA: Diagnosis not present

## 2023-10-19 LAB — HM DIABETES EYE EXAM

## 2023-10-22 ENCOUNTER — Encounter (INDEPENDENT_AMBULATORY_CARE_PROVIDER_SITE_OTHER): Payer: Self-pay | Admitting: Vascular Surgery

## 2023-10-22 ENCOUNTER — Ambulatory Visit (INDEPENDENT_AMBULATORY_CARE_PROVIDER_SITE_OTHER): Payer: 59

## 2023-10-22 ENCOUNTER — Ambulatory Visit (INDEPENDENT_AMBULATORY_CARE_PROVIDER_SITE_OTHER): Payer: 59 | Admitting: Vascular Surgery

## 2023-10-22 VITALS — BP 158/90 | HR 65 | Resp 16

## 2023-10-22 DIAGNOSIS — E782 Mixed hyperlipidemia: Secondary | ICD-10-CM | POA: Diagnosis not present

## 2023-10-22 DIAGNOSIS — Z89611 Acquired absence of right leg above knee: Secondary | ICD-10-CM | POA: Diagnosis not present

## 2023-10-22 DIAGNOSIS — E1165 Type 2 diabetes mellitus with hyperglycemia: Secondary | ICD-10-CM | POA: Diagnosis not present

## 2023-10-22 DIAGNOSIS — I739 Peripheral vascular disease, unspecified: Secondary | ICD-10-CM

## 2023-10-22 DIAGNOSIS — I10 Essential (primary) hypertension: Secondary | ICD-10-CM | POA: Diagnosis not present

## 2023-10-22 DIAGNOSIS — E1159 Type 2 diabetes mellitus with other circulatory complications: Secondary | ICD-10-CM | POA: Diagnosis not present

## 2023-10-22 NOTE — Progress Notes (Unsigned)
MRN : [id number]  VOLNEY BUTER is a 57 y.o. (1966/10/13) male who presents with chief complaint of check circulation.  History of Present Illness:  The patient returns to the office for followup and review of the noninvasive studies.    There have been no interval changes in lower extremity symptoms. No interval shortening of the patient's claudication distance or development of rest pain symptoms. No new ulcers or wounds have occurred since the last visit.  The patient is noting that his ambulation and many of his activities of daily living are being limited by his current prosthesis.  It is not fitting well secondary to volume loss and morphologic changes of his stump.  He is interested in obtaining an updated prosthetic that would improve his activities of daily living.   There have been no significant changes to the patient's overall health care.   The patient denies amaurosis fugax or recent TIA symptoms. There are no documented recent neurological changes noted. There is no history of DVT, PE or superficial thrombophlebitis. The patient denies recent episodes of angina or shortness of breath.    ABI Rt=AKA and Lt=1.09    Current Meds  Medication Sig   ACCU-CHEK GUIDE test strip 1 EACH BY OTHER ROUTE IN THE MORNING AND AT BEDTIME. USE AS INSTRUCTED   Accu-Chek Softclix Lancets lancets USE TO TEST BLOOD SUGAR UP TO FOUR TIMES DAILY AS DIRECTED   amLODipine (NORVASC) 10 MG tablet Take 1 tablet (10 mg total) by mouth every evening.   Cholecalciferol (VITAMIN D3) 50 MCG (2000 UT) CAPS Take 1 capsule (2,000 Units total) by mouth daily.   cyanocobalamin (VITAMIN B12) 1000 MCG tablet Take 1 tablet (1,000 mcg total) by mouth daily.   empagliflozin (JARDIANCE) 25 MG TABS tablet Take 1 tablet (25 mg total) by mouth daily.   glipiZIDE (GLUCOTROL XL) 2.5 MG 24 hr tablet Take 1 tablet (2.5 mg total) by mouth daily with  breakfast.   hydrALAZINE (APRESOLINE) 10 MG tablet Take 1 tablet (10 mg total) by mouth 2 (two) times daily. If bp above 140/90   Iron, Ferrous Sulfate, 325 (65 Fe) MG TABS Take 325 mg by mouth daily.   rosuvastatin (CRESTOR) 40 MG tablet TAKE 1 TABLET(40 MG) BY MOUTH DAILY   Semaglutide (RYBELSUS) 14 MG TABS Take 1 tablet (14 mg total) by mouth daily.   valsartan-hydrochlorothiazide (DIOVAN-HCT) 320-25 MG tablet Take 1 tablet by mouth daily.    Past Medical History:  Diagnosis Date   Diabetes mellitus without complication (HCC)    Hyperlipidemia    Hypertension     Past Surgical History:  Procedure Laterality Date   AMPUTATION Right 04/16/2021   Procedure: AMPUTATION  RIGHT 5TH RAY, INCISION AND DRAINAGE;  Surgeon: Felecia Shelling, DPM;  Location: [facility];  Service: Podiatry;  Laterality: Right;   AMPUTATION Right 11/24/2022   Procedure: AMPUTATION ABOVE KNEE;  Surgeon: Renford Dills, MD;  Location: [facility];  Service: Vascular;  Laterality: Right;   APPLICATION OF WOUND VAC Right 04/21/2021   Procedure: APPLICATION OF WOUND VAC;  Surgeon: Felecia Shelling, DPM;  Location: [facility];  Service: Podiatry;  Laterality: Right;   BONE BIOPSY Left 06/27/2020   Procedure: BONE BIOPSY;  Surgeon: Rosetta Posner, DPM;  Location: [facility];  Service: Podiatry;  Laterality: Left;   COLONOSCOPY WITH PROPOFOL N/A 06/22/2022   Procedure: COLONOSCOPY WITH PROPOFOL;  Surgeon: Wyline Mood, MD;  Location: [facility] ENDOSCOPY;  Service: Gastroenterology;  Laterality: N/A;   I & D EXTREMITY Right 11/22/2022   Procedure: IRRIGATION AND DEBRIDEMENT EXTREMITY, below knee amputation right leg;  Surgeon: Leafy Ro, MD;  Location: [facility];  Service: General;  Laterality: Right;   INCISION AND DRAINAGE Left 06/27/2020   Procedure: INCISION AND DRAINAGE;  Surgeon: Rosetta Posner, DPM;  Location: [facility];  Service: Podiatry;  Laterality: Left;   IRRIGATION AND DEBRIDEMENT FOOT Right 04/21/2021   Procedure: IRRIGATION  AND DEBRIDEMENT FOOT;  Surgeon: Felecia Shelling, DPM;  Location: [facility];  Service: Podiatry;  Laterality: Right;   LOWER EXTREMITY ANGIOGRAPHY Left 06/30/2020   Procedure: Lower Extremity Angiography;  Surgeon: Annice Needy, MD;  Location: [facility] INVASIVE CV LAB;  Service: Cardiovascular;  Laterality: Left;   LOWER EXTREMITY ANGIOGRAPHY Right 04/14/2021   Procedure: Lower Extremity Angiography;  Surgeon: Annice Needy, MD;  Location: [facility];  Service: Cardiovascular;  Laterality: Right;   NO PAST SURGERIES     WOUND DEBRIDEMENT Right 04/19/2021   Procedure: DEBRIDEMENT WOUND;  Surgeon: Felecia Shelling, DPM;  Location: [facility];  Service: Podiatry;  Laterality: Right;    Social History Social History   Tobacco Use   Smoking status: Never   Smokeless tobacco: Never  Vaping Use   Vaping status: Never Used  Substance Use Topics   Alcohol use: Yes    Alcohol/week: 6.0 - 12.0 standard drinks of alcohol    Types: 6 - 12 Cans of beer per week    Comment: Occasional   Drug use: No    Family History Family History  Problem Relation Age of Onset   Heart disease Father    Heart attack Father    Healthy Mother    Prostate cancer Neg Hx    Kidney cancer Neg Hx    Bladder Cancer Neg Hx     No Known Allergies   REVIEW OF SYSTEMS (Negative unless checked)  Constitutional: [] Weight loss  [] Fever  [] Chills Cardiac: [] Chest pain   [] Chest pressure   [] Palpitations   [] Shortness of breath when laying flat   [] Shortness of breath with exertion. Vascular:  [x] Pain in legs with walking   [] Pain in legs at rest  [] History of DVT   [] Phlebitis   [] Swelling in legs   [] Varicose veins   [] Non-healing ulcers Pulmonary:   [] Uses home oxygen   [] Productive cough   [] Hemoptysis   [] Wheeze  [] COPD   [] Asthma Neurologic:  [] Dizziness   [] Seizures   [] History of stroke   [] History of TIA  [] Aphasia   [] Vissual changes   [] Weakness or numbness in arm   [] Weakness or numbness in leg Musculoskeletal:    [] Joint swelling   [] Joint pain   [] Low back pain Hematologic:  [] Easy bruising  [] Easy bleeding   [] Hypercoagulable state   [] Anemic Gastrointestinal:  [] Diarrhea   [] Vomiting  [] Gastroesophageal reflux/heartburn   [] Difficulty swallowing. Genitourinary:  [] Chronic kidney disease   [] Difficult urination  [] Frequent urination   [] Blood in urine Skin:  [] Rashes   [] Ulcers  Psychological:  [] History of anxiety   []  History of major depression.  Physical Examination  Vitals:  10/22/23 1434  BP: (!) 158/90  Pulse: 65  Resp: 16   There is no height or weight on file to calculate BMI. Gen: WD/WN, NAD Head: [place]/AT, No temporalis wasting.  Ear/Nose/Throat: Hearing grossly intact, nares w/o erythema or drainage Eyes: PER, EOMI, sclera nonicteric.  Neck: Supple, no masses.  No bruit or JVD.  Pulmonary:  Good air movement, no audible wheezing, no use of accessory muscles.  Cardiac: RRR, normal S1, S2, no Murmurs. Vascular:  mild trophic changes, no open wounds Vessel Right Left  Radial Palpable Palpable  PT AKA Not Palpable  DP AKA Not Palpable  Gastrointestinal: soft, non-distended. No guarding/no peritoneal signs.  Musculoskeletal: M/S 5/5 throughout.  No visible deformity.  Neurologic: CN 2-12 intact. Pain and light touch intact in extremities.  Symmetrical.  Speech is fluent. Motor exam as listed above. Psychiatric: Judgment intact, Mood & affect appropriate for pt's clinical situation. Dermatologic: No rashes or ulcers noted.  No changes consistent with cellulitis.   CBC Lab Results  Component Value Date   WBC 6.1 06/04/2023   HGB 16.0 06/04/2023   HCT 47.4 06/04/2023   MCV 90.3 06/04/2023   PLT 270 06/04/2023    BMET    Component Value Date/Time   NA 141 06/04/2023 1543   NA 141 09/06/2022 1417   K 3.5 06/04/2023 1543   CL 100 06/04/2023 1543   CO2 25 06/04/2023 1543   GLUCOSE 81 06/04/2023 1543   BUN 23 06/04/2023 1543   BUN 25 (H) 09/06/2022 1417   CREATININE  0.95 06/04/2023 1543   CALCIUM 9.5 06/04/2023 1543   GFRNONAA >60 11/29/2022 0609   GFRNONAA 83 02/09/2021 1135   GFRAA 96 02/09/2021 1135   CrCl cannot be calculated (Patient's most recent lab result is older than the maximum 21 days allowed.).  COAG No results found for: "INR", "PROTIME"  Radiology No results found.   Assessment/Plan 1. PAD (peripheral artery disease) (HCC)  Recommend:  The patient has evidence of atherosclerosis of the lower extremities with claudication.  The patient does not voice lifestyle limiting changes at this point in time.  Noninvasive studies do not suggest clinically significant change.  No invasive studies, angiography or surgery at this time The patient should continue walking and begin a more formal exercise program.  The patient should continue antiplatelet therapy and aggressive treatment of the lipid abnormalities  No changes in the patient's medications at this time  Continued surveillance is indicated as atherosclerosis is likely to progress with time.    The patient will continue follow up with noninvasive studies as ordered.   2. Primary hypertension Continue antihypertensive medications as already ordered, these medications have been reviewed and there are no changes at this time.  3. Type 2 diabetes mellitus with hyperglycemia, without long-term current use of insulin (HCC) Continue hypoglycemic medications as already ordered, these medications have been reviewed and there are no changes at this time.  Hgb A1C to be monitored as already arranged by primary service  4. Type 2 diabetes mellitus with other circulatory complication, unspecified whether long term insulin use (HCC) Continue hypoglycemic medications as already ordered, these medications have been reviewed and there are no changes at this time.  Hgb A1C to be monitored as already arranged by primary service  5. Mixed hyperlipidemia Continue statin as ordered and  reviewed, no changes at this time  6. History of above knee amputation, right (HCC) Daniele is a right transfemoral amputee.  He does not have any comorbidities  that impact his mobility or his ability to function with a prosthetic limb.  Currently brandies prosthesis is poorly fitting due to volume loss in his knee/prosthesis is not meeting his functional needs.  Zariel verbally communicates a strong desire to get a new prosthesis with a microprocessor knee.  At the present time he is using a combination of a walker or a cane but would not be expected to require these assistive devices with a new prosthesis and continued physical therapy.  He is a very motivated individual and is very anxious to get an improved prosthesis that would grant him a marked increase in his independence.  He has progressed to a K3 level of ambulation walking inside and outside his home over all environmental barriers at a variable cadence.  There is no doubt that he would benefit from a socket replacement with a microprocessor controlled knee.  He is an excellent candidate.    Levora Dredge, MD  10/22/2023 2:43 PM

## 2023-10-23 DIAGNOSIS — E113593 Type 2 diabetes mellitus with proliferative diabetic retinopathy without macular edema, bilateral: Secondary | ICD-10-CM | POA: Diagnosis not present

## 2023-10-23 DIAGNOSIS — Z961 Presence of intraocular lens: Secondary | ICD-10-CM | POA: Diagnosis not present

## 2023-10-23 LAB — VAS US ABI WITH/WO TBI: Left ABI: 1.09

## 2023-10-24 ENCOUNTER — Encounter (INDEPENDENT_AMBULATORY_CARE_PROVIDER_SITE_OTHER): Payer: Self-pay | Admitting: Vascular Surgery

## 2023-10-25 ENCOUNTER — Telehealth (INDEPENDENT_AMBULATORY_CARE_PROVIDER_SITE_OTHER): Payer: Self-pay

## 2023-10-25 NOTE — Telephone Encounter (Signed)
He recently saw Dr. Gilda Crease, he is editing the note to have those details and we will submit once he has completed that.

## 2023-10-25 NOTE — Telephone Encounter (Signed)
[name] called stating [facility] needs a Rx for his prosthesis to get the ball rolling. He stated the woman mentioned messaging you to give more details on what is needed.

## 2023-10-26 NOTE — Telephone Encounter (Signed)
Patient is asking for a prothesis with knee control. The one he has doesn't bend at the knee making it hard to drive.

## 2023-10-26 NOTE — Telephone Encounter (Signed)
Dr. Gilda Crease updated his note with the recommendations from [facility] as they have requested

## 2023-10-29 NOTE — Telephone Encounter (Signed)
Patient notified

## 2023-10-30 ENCOUNTER — Other Ambulatory Visit: Payer: Self-pay | Admitting: Family Medicine

## 2023-10-30 DIAGNOSIS — E1169 Type 2 diabetes mellitus with other specified complication: Secondary | ICD-10-CM

## 2023-10-30 DIAGNOSIS — E1122 Type 2 diabetes mellitus with diabetic chronic kidney disease: Secondary | ICD-10-CM | POA: Diagnosis not present

## 2023-10-30 DIAGNOSIS — R801 Persistent proteinuria, unspecified: Secondary | ICD-10-CM | POA: Diagnosis not present

## 2023-10-30 DIAGNOSIS — I10 Essential (primary) hypertension: Secondary | ICD-10-CM | POA: Diagnosis not present

## 2023-10-30 DIAGNOSIS — N182 Chronic kidney disease, stage 2 (mild): Secondary | ICD-10-CM | POA: Diagnosis not present

## 2023-11-28 ENCOUNTER — Other Ambulatory Visit: Payer: Self-pay | Admitting: Family Medicine

## 2023-11-28 DIAGNOSIS — E785 Hyperlipidemia, unspecified: Secondary | ICD-10-CM

## 2023-11-28 DIAGNOSIS — I10 Essential (primary) hypertension: Secondary | ICD-10-CM

## 2023-12-11 ENCOUNTER — Encounter: Payer: Self-pay | Admitting: Family Medicine

## 2023-12-11 ENCOUNTER — Ambulatory Visit (INDEPENDENT_AMBULATORY_CARE_PROVIDER_SITE_OTHER): Payer: 59 | Admitting: Family Medicine

## 2023-12-11 VITALS — BP 170/88 | HR 62 | Temp 97.6°F | Resp 18 | Ht 71.0 in | Wt 200.0 lb

## 2023-12-11 DIAGNOSIS — E785 Hyperlipidemia, unspecified: Secondary | ICD-10-CM | POA: Diagnosis not present

## 2023-12-11 DIAGNOSIS — E559 Vitamin D deficiency, unspecified: Secondary | ICD-10-CM | POA: Diagnosis not present

## 2023-12-11 DIAGNOSIS — I739 Peripheral vascular disease, unspecified: Secondary | ICD-10-CM

## 2023-12-11 DIAGNOSIS — E1169 Type 2 diabetes mellitus with other specified complication: Secondary | ICD-10-CM

## 2023-12-11 DIAGNOSIS — I152 Hypertension secondary to endocrine disorders: Secondary | ICD-10-CM | POA: Diagnosis not present

## 2023-12-11 DIAGNOSIS — N2581 Secondary hyperparathyroidism of renal origin: Secondary | ICD-10-CM

## 2023-12-11 DIAGNOSIS — Z7984 Long term (current) use of oral hypoglycemic drugs: Secondary | ICD-10-CM

## 2023-12-11 DIAGNOSIS — I10 Essential (primary) hypertension: Secondary | ICD-10-CM | POA: Diagnosis not present

## 2023-12-11 DIAGNOSIS — R809 Proteinuria, unspecified: Secondary | ICD-10-CM | POA: Insufficient documentation

## 2023-12-11 DIAGNOSIS — E1129 Type 2 diabetes mellitus with other diabetic kidney complication: Secondary | ICD-10-CM | POA: Insufficient documentation

## 2023-12-11 DIAGNOSIS — E1159 Type 2 diabetes mellitus with other circulatory complications: Secondary | ICD-10-CM | POA: Diagnosis not present

## 2023-12-11 DIAGNOSIS — Z89611 Acquired absence of right leg above knee: Secondary | ICD-10-CM | POA: Diagnosis not present

## 2023-12-11 MED ORDER — HYDRALAZINE HCL 10 MG PO TABS: 10.0000 mg | ORAL_TABLET | Freq: Three times a day (TID) | ORAL | 1 refills | Status: DC

## 2023-12-11 MED ORDER — EMPAGLIFLOZIN 25 MG PO TABS: 25.0000 mg | ORAL_TABLET | Freq: Every day | ORAL | 1 refills | Status: DC

## 2023-12-11 MED ORDER — AMLODIPINE BESYLATE 10 MG PO TABS: 10.0000 mg | ORAL_TABLET | Freq: Every evening | ORAL | 1 refills | Status: DC

## 2023-12-11 MED ORDER — ROSUVASTATIN CALCIUM 40 MG PO TABS: ORAL_TABLET | ORAL | 1 refills | Status: DC

## 2023-12-11 MED ORDER — RYBELSUS 14 MG PO TABS: 14.0000 mg | ORAL_TABLET | Freq: Every day | ORAL | 1 refills | Status: DC

## 2023-12-11 MED ORDER — VALSARTAN-HYDROCHLOROTHIAZIDE 320-25 MG PO TABS: 1.0000 | ORAL_TABLET | Freq: Every day | ORAL | 1 refills | Status: DC

## 2023-12-11 NOTE — Progress Notes (Addendum)
 Name: [name]   MRN: [id number]    DOB: 10/22/1966   Date:12/11/2023       Progress Note  Subjective  Chief Complaint  Chief Complaint  Patient presents with   Medical Management of Chronic Issues    HPI  Dyslipidemia: taking Crestor  daily, Last LDL was above goal again at 95. Goal for him is below 70. He refuses to add zetia at this time. We will recheck next visit    History of gas gangrene : s/p right AKA done 11/22/2022.  He has a prosthesis now and is doing well , he is disappointed because he did not get approved for micro processor, he would like to have more function to be able to be active    Uncontrolled HTN/white coat : currently taking valsartan  hctz 320/25 mg daily and norvasc  10 mg,, he also takes hydralazine  prn. He states bp at home is in the 120's-130's and lower towards the end of the day. He states he gets anxious when he comes to the office. We added Hydralazine  on his last visit and is taking it BID now He was seen by nephrologist Nov 2024 and bp was 144/86   CKI stage I , DM with proteinuria  with secondary hyperparathyroidism : under the care of nephrologist , recently started on Kerendia, tolerating it well    DMII: A1C is at goal now, he is down from 5 mg to 2.5 mg dose, denies hypoglycemic episode. he states glucose has been controlled since he changed his diet.  He is taking Jardiance , Rybelsus  and Glipizide  2.5 mg now. He denies polyphagia, polydipsia or polyuria. He has associated PAD, diabetic retinopathy, dyslipidemia and microalbuminuria. He is on statin therapy, ARB and aspirin  daily    B12 and vitamin D  deficiency : continue supplementation as recommended by Dr. Dennise    Patient Active Problem List   Diagnosis Date Noted   Diabetes mellitus with proteinuria (HCC) 12/11/2023   Hypertension associated with diabetes (HCC) 12/11/2023   History of above knee amputation, right (HCC) 12/13/2022   Elevated troponin 11/21/2022   B12 deficiency 09/05/2022    Proliferative diabetic retinopathy of both eyes without macular edema associated with type 2 diabetes mellitus (HCC) 06/05/2022   PAD (peripheral artery disease) (HCC) 04/12/2021   Secondary renal hyperparathyroidism (HCC) 03/19/2019   Diabetes (HCC) 07/03/2018   Vitamin D  deficiency 04/11/2018   ED (erectile dysfunction) 04/10/2018   Lower urinary tract symptoms (LUTS) 02/09/2017   Hypertension 07/05/2015   Hyperglycemia due to type 2 diabetes mellitus (HCC) 07/05/2015   HLD (hyperlipidemia) 07/05/2015    Past Surgical History:  Procedure Laterality Date   AMPUTATION Right 04/16/2021   Procedure: AMPUTATION  RIGHT 5TH RAY, INCISION AND DRAINAGE;  Surgeon: Janit Thresa HERO, DPM;  Location: [facility];  Service: Podiatry;  Laterality: Right;   AMPUTATION Right 11/24/2022   Procedure: AMPUTATION ABOVE KNEE;  Surgeon: Jama Cordella MATSU, MD;  Location: [facility];  Service: Vascular;  Laterality: Right;   APPLICATION OF WOUND VAC Right 04/21/2021   Procedure: APPLICATION OF WOUND VAC;  Surgeon: Janit Thresa HERO, DPM;  Location: [facility];  Service: Podiatry;  Laterality: Right;   BONE BIOPSY Left 06/27/2020   Procedure: BONE BIOPSY;  Surgeon: Lennie Barter, DPM;  Location: [facility];  Service: Podiatry;  Laterality: Left;   COLONOSCOPY WITH PROPOFOL  N/A 06/22/2022   Procedure: COLONOSCOPY WITH PROPOFOL ;  Surgeon: Therisa Bi, MD;  Location: [facility] ENDOSCOPY;  Service: Gastroenterology;  Laterality: N/A;   I &  D EXTREMITY Right 11/22/2022   Procedure: IRRIGATION AND DEBRIDEMENT EXTREMITY, below knee amputation right leg;  Surgeon: Jordis Laneta FALCON, MD;  Location: [facility];  Service: General;  Laterality: Right;   INCISION AND DRAINAGE Left 06/27/2020   Procedure: INCISION AND DRAINAGE;  Surgeon: Lennie Barter, DPM;  Location: [facility];  Service: Podiatry;  Laterality: Left;   IRRIGATION AND DEBRIDEMENT FOOT Right 04/21/2021   Procedure: IRRIGATION AND DEBRIDEMENT FOOT;  Surgeon: Janit Thresa HERO, DPM;   Location: [facility];  Service: Podiatry;  Laterality: Right;   LOWER EXTREMITY ANGIOGRAPHY Left 06/30/2020   Procedure: Lower Extremity Angiography;  Surgeon: Marea Selinda RAMAN, MD;  Location: [facility] IN[facility];  Service: Cardiovascular;  Laterality: Left;   LOWER EXTREMITY ANGIOGRAPHY Right 04/14/2021   Procedure: Lower Extremity Angiography;  Surgeon: Marea Selinda RAMAN, MD;  Location: [facility];  Service: Cardiovascular;  Laterality: Right;   NO PAST SURGERIES     WOUND DEBRIDEMENT Right 04/19/2021   Procedure: DEBRIDEMENT WOUND;  Surgeon: Janit Thresa HERO, DPM;  Location: [facility];  Service: Podiatry;  Laterality: Right;    Family History  Problem Relation Age of Onset   Heart disease Father    Heart attack Father    Healthy Mother    Prostate cancer Neg Hx    Kidney cancer Neg Hx    Bladder Cancer Neg Hx     Social History   Tobacco Use   Smoking status: Never   Smokeless tobacco: Never  Substance Use Topics   Alcohol use: Yes    Alcohol/week: 6.0 - 12.0 standard drinks of alcohol    Types: 6 - 12 Cans of beer per week    Comment: Occasional     Current Outpatient Medications:    ACCU-CHEK GUIDE TEST test strip, 1 EACH BY OTHER ROUTE IN THE MORNING AND AT BEDTIME. USE AS INSTRUCTED, Disp: 200 strip, Rfl: 0   Accu-Chek Softclix Lancets lancets, USE TO TEST BLOOD SUGAR UP TO FOUR TIMES DAILY AS DIRECTED, Disp: 200 each, Rfl: 1   Cholecalciferol (VITAMIN D3) 50 MCG (2000 UT) CAPS, Take 1 capsule (2,000 Units total) by mouth daily., Disp: 90 capsule, Rfl: 1   cyanocobalamin  (VITAMIN B12) 1000 MCG tablet, Take 1 tablet (1,000 mcg total) by mouth daily., Disp: 90 tablet, Rfl: 1   Finerenone (KERENDIA) 10 MG TABS, Take 1 tablet by mouth daily at 12 noon., Disp: , Rfl:    glipiZIDE  (GLUCOTROL  XL) 2.5 MG 24 hr tablet, Take 1 tablet (2.5 mg total) by mouth daily with breakfast., Disp: 90 tablet, Rfl: 1   Iron , Ferrous Sulfate , 325 (65 Fe) MG TABS, Take 325 mg by mouth daily., Disp: 90  tablet, Rfl: 1   amLODipine  (NORVASC ) 10 MG tablet, Take 1 tablet (10 mg total) by mouth every evening., Disp: 90 tablet, Rfl: 1   empagliflozin  (JARDIANCE ) 25 MG TABS tablet, Take 1 tablet (25 mg total) by mouth daily., Disp: 90 tablet, Rfl: 1   hydrALAZINE  (APRESOLINE ) 10 MG tablet, Take 1 tablet (10 mg total) by mouth 3 (three) times daily. If bp above 140/90, Disp: 270 tablet, Rfl: 1   rosuvastatin  (CRESTOR ) 40 MG tablet, TAKE 1 TABLET BY MOUTH EVERY DAY, Disp: 90 tablet, Rfl: 1   Semaglutide  (RYBELSUS ) 14 MG TABS, Take 1 tablet (14 mg total) by mouth daily., Disp: 90 tablet, Rfl: 1   valsartan -hydrochlorothiazide  (DIOVAN -HCT) 320-25 MG tablet, Take 1 tablet by mouth daily., Disp: 90 tablet, Rfl: 1  No Known Allergies  I  personally reviewed active problem list, medication list, allergies with the patient/caregiver today.   ROS  Ten systems reviewed and is negative except as mentioned in HPI    Objective  Vitals:   12/11/23 0920 12/11/23 1007  BP: (!) 178/100 (!) 170/88  Pulse: 62   Resp: 18   Temp: 97.6 F (36.4 C)   SpO2: 100%   Weight: 200 lb (90.7 kg)   Height: 5' 11 (1.803 m)     Body mass index is 27.89 kg/m.  Physical Exam  Constitutional: Patient appears well-developed and well-nourished.  No distress.  HEENT: head atraumatic, normocephalic, pupils equal and reactive to light, neck supple Cardiovascular: Normal rate, regular rhythm and normal heart sounds.  No murmur heard. No BLE edema. Pulmonary/Chest: Effort normal and breath sounds normal. No respiratory distress. Abdominal: Soft.  There is no tenderness. Psychiatric: Patient has a normal mood and affect. behavior is normal. Judgment and thought content normal.  Muscular skeletal: AKA , wearing prosthesis and walker   Recent Results (from the past 2160 hours)  POCT HgB A1C     Status: Abnormal   Collection Time: 09/17/23  2:28 PM  Result Value Ref Range   Hemoglobin A1C 5.9 (A) 4.0 - 5.6 %   HbA1c POC  (<> result, manual entry)     HbA1c, POC (prediabetic range)     HbA1c, POC (controlled diabetic range)    HM DIABETES EYE EXAM     Status: Abnormal   Collection Time: 10/19/23  2:15 PM  Result Value Ref Range   HM Diabetic Eye Exam Retinopathy (A) No Retinopathy    Comment: ABSTRACTED BY HIM  VAS US  ABI WITH/WO TBI     Status: None   Collection Time: 10/22/23  2:18 PM  Result Value Ref Range   Right ABI AKA    Left ABI 1.09     Diabetic Foot Exam:     PHQ2/9:    09/17/2023    2:21 PM 06/04/2023    2:54 PM 02/26/2023    3:08 PM 12/13/2022    3:29 PM 12/08/2022   11:14 AM  Depression screen PHQ 2/9  Decreased Interest 0 0 0 0 0  Down, Depressed, Hopeless 0 0 0 0 0  PHQ - 2 Score 0 0 0 0 0  Altered sleeping 0 0 0 0 0  Tired, decreased energy 0 0 0 0 0  Change in appetite 0 0 0 0 0  Feeling bad or failure about yourself  0 0 0 0 0  Trouble concentrating 0 0 0 0 0  Moving slowly or fidgety/restless 0 0 0 0 0  Suicidal thoughts 0 0 0 0 0  PHQ-9 Score 0 0 0 0 0    phq 9 is negative   Fall Risk:    12/11/2023    9:21 AM 09/17/2023    2:21 PM 06/04/2023    2:53 PM 02/26/2023    3:08 PM 12/13/2022    3:29 PM  Fall Risk   Falls in the past year? 0 0 0 0 1  Number falls in past yr: 0 0 0  1  Injury with Fall? 0 0 0  0  Risk for fall due to :  Impaired balance/gait Impaired mobility;Impaired balance/gait Impaired mobility Impaired balance/gait;Impaired mobility  Follow up  Falls prevention discussed Falls prevention discussed Falls prevention discussed;Education provided;Falls evaluation completed Falls prevention discussed     Assessment & Plan  1. Dyslipidemia associated with type 2 diabetes mellitus (HCC) (  Primary)  Discussed switching from glipizide  to actos  due to low A1C but he wants to stay on glipizide , discussed risk of hypoglycemia. We will recheck level next time and decide if 2.5 mg dose is still good for him   - empagliflozin  (JARDIANCE ) 25 MG TABS tablet;  Take 1 tablet (25 mg total) by mouth daily.  Dispense: 90 tablet; Refill: 1 - rosuvastatin  (CRESTOR ) 40 MG tablet; TAKE 1 TABLET BY MOUTH EVERY DAY  Dispense: 90 tablet; Refill: 1 - Semaglutide  (RYBELSUS ) 14 MG TABS; Take 1 tablet (14 mg total) by mouth daily.  Dispense: 90 tablet; Refill: 1  2. Secondary renal hyperparathyroidism (HCC)  Monitored by nephrologist   3. PAD (peripheral artery disease) (HCC)  - rosuvastatin  (CRESTOR ) 40 MG tablet; TAKE 1 TABLET BY MOUTH EVERY DAY  Dispense: 90 tablet; Refill: 1  4. S/P above knee amputation, right (HCC)  Doing well , wearing a prosthesis and walker   5. Vitamin D  deficiency  Only taking vitamin D  once a week otc supplementation   6. Hypertension associated with diabetes (HCC)  - amLODipine  (NORVASC ) 10 MG tablet; Take 1 tablet (10 mg total) by mouth every evening.  Dispense: 90 tablet; Refill: 1 - hydrALAZINE  (APRESOLINE ) 10 MG tablet; Take 1 tablet (10 mg total) by mouth 3 (three) times daily. If bp above 140/90  Dispense: 270 tablet; Refill: 1 - valsartan -hydrochlorothiazide  (DIOVAN -HCT) 320-25 MG tablet; Take 1 tablet by mouth daily.  Dispense: 90 tablet; Refill: 1  7. White coat syndrome with diagnosis of hypertension  He states bp is well controlled at home     1. Dyslipidemia associated with type 2 diabetes mellitus (HCC) (Primary)  Continue medication  8. Diabetes mellitus with proteinuria (HCC)    Keep follow up with nephrologist

## 2023-12-17 ENCOUNTER — Telehealth: Payer: Self-pay

## 2023-12-17 NOTE — Telephone Encounter (Signed)
 PA done through cover my meds waiting on insurance to determine

## 2024-01-01 ENCOUNTER — Encounter: Payer: Self-pay | Admitting: Family Medicine

## 2024-01-01 ENCOUNTER — Ambulatory Visit: Payer: Self-pay

## 2024-01-01 ENCOUNTER — Telehealth (INDEPENDENT_AMBULATORY_CARE_PROVIDER_SITE_OTHER): Payer: 59 | Admitting: Family Medicine

## 2024-01-01 DIAGNOSIS — K5904 Chronic idiopathic constipation: Secondary | ICD-10-CM

## 2024-01-01 DIAGNOSIS — Z89611 Acquired absence of right leg above knee: Secondary | ICD-10-CM | POA: Diagnosis not present

## 2024-01-01 DIAGNOSIS — I739 Peripheral vascular disease, unspecified: Secondary | ICD-10-CM

## 2024-01-01 DIAGNOSIS — K5909 Other constipation: Secondary | ICD-10-CM

## 2024-01-01 MED ORDER — TRULANCE 3 MG PO TABS: 1.0000 | ORAL_TABLET | Freq: Every day | ORAL | 0 refills | Status: AC

## 2024-01-01 NOTE — Telephone Encounter (Signed)
Message from [place] A sent at 01/01/2024  8:15 AM EST  Summary: constipation   Pt is calling to see if his PCP can call him in something for his constipation. Pt states it has been like this for over a week and the over the counter medication has not worked. Please advise.   [id number] [place], [place] - [address] [place] [address] Phone: [phone] Fax: [phone]         Chief Complaint: constipation tried OTC not helping Symptoms: hard stools, strain  Frequency: x 1 week Pertinent Negatives: Patient denies abd pain, anal pain Disposition: [] ED /[] Urgent Care (no appt availability in office) / [] Appointment(In office/virtual)/ [x]  [facility]/ [] Home Care/ [] Refused Recommended Disposition /[] [facility]/ []  Follow-up with PCP Additional Notes: pt stated since amputation, due to decreased activity, that he has had a problem with BM's being harder and loose. Pt is not drinking enough fluids and discussed fiber intake. Pt has tried Miralax and stool soften  Reason for Disposition  Unable to have a bowel movement (BM) without laxative or enema  Answer Assessment - Initial Assessment Questions 1. STOOL PATTERN OR FREQUENCY: "How often do you have a bowel movement (BM)?"  (Normal range: 3 times a day to every 3 days)  "When was your last BM?"       This am  2. STRAINING: "Do you have to strain to have a BM?"      sometimes 3. RECTAL PAIN: "Does your rectum hurt when the stool comes out?" If Yes, ask: "Do you have hemorrhoids? How bad is the pain?"  (Scale 1-10; or mild, moderate, severe)     no 4. STOOL COMPOSITION: "Are the stools hard?"      yes 5. BLOOD ON STOOLS: "Has there been any blood on the toilet tissue or on the surface of the BM?" If Yes, ask: "When was the last time?"     no 6. CHRONIC CONSTIPATION: "Is this a new problem for you?"  If No, ask: "How long have you had this problem?" (days, weeks, months)       Feels like cannot empty bowels 7. CHANGES IN DIET OR HYDRATION: "Have there been any recent changes in your diet?" "How much fluids are you drinking on a daily basis?"  "How much have you had to drink today?"     No changes  8. MEDICINES: "Have you been taking any new medicines?" "Are you taking any narcotic pain medicines?" (e.g., Dilaudid, morphine, Percocet, Vicodin)     No no  9. LAXATIVES: "Have you been using any stool softeners, laxatives, or enemas?"  If Yes, ask "What, how often, and when was the last time?"     Miralax and stool softener- took Miralax yesterday  10. ACTIVITY:  "How much walking do you do every day?"  "Has your activity level decreased in the past week?"        amputate 11. CAUSE: "What do you think is causing the constipation?"        Sitting  12. OTHER SYMPTOMS: "Do you have any other symptoms?" (e.g., abdomen pain, bloating, fever, vomiting)       Is having  13. MEDICAL HISTORY: "Do you have a history of hemorrhoids, rectal fissures, or rectal surgery or rectal abscess?"         *No Answer*  Protocols used: Constipation-A-AH

## 2024-01-01 NOTE — Progress Notes (Signed)
Name: [name]   MRN: [id number]    DOB: 1966-08-08   Date:01/01/2024       Progress Note  Subjective  Chief Complaint  Chief Complaint  Patient presents with   Constipation    Off/on, req medication to help pt has tried OTC miralax, stool softeners no help    I connected with  Jillyn Hidden  on 01/01/24 at  3:00 PM EST by a video enabled telemedicine application and verified that I am speaking with the correct person using two identifiers.  I discussed the limitations of evaluation and management by telemedicine and the availability of in person appointments. The patient expressed understanding and agreed to proceed with a virtual visit  Staff also discussed with the patient that there may be a patient responsible charge related to this service. Patient Location: at home  Provider Location: [facility] Additional Individuals present: wife  Discussed the use of AI scribe software for clinical note transcription with the patient, who gave verbal consent to proceed.  History of Present Illness   The patient, with a history of Above Knee amputation in December 2023, has been experiencing on-and-off constipation since the procedure. He attributes this to decreased physical activity post-amputation. He has been using a prosthesis, walker, and cane for mobility, but expresses difficulty due to lack of control over the prosthetic knee. He is currently seeking approval for a microprocessor knee to improve his activity level.  He reports a bowel movement every other day, sometimes requiring straining. The consistency of the stool is often hard, and he has tried various remedies including Miralax and stool softeners, with limited success. He has also stopped taking iron supplements over a month ago due to concerns about hardening of the stool, but the constipation persists.  The patient's diet includes fiber-rich foods like grapes, but he expresses reluctance to increase his sugar intake for the sake  of bowel movement. He has had a colonoscopy in June of the previous year, with a couple of polyps found, and is due for a repeat in three years. He denies any presence of blood in his stools.        Patient Active Problem List   Diagnosis Date Noted   Diabetes mellitus with proteinuria (HCC) 12/11/2023   Hypertension associated with diabetes (HCC) 12/11/2023   History of above knee amputation, right (HCC) 12/13/2022   Elevated troponin 11/21/2022   B12 deficiency 09/05/2022   Proliferative diabetic retinopathy of both eyes without macular edema associated with type 2 diabetes mellitus (HCC) 06/05/2022   PAD (peripheral artery disease) (HCC) 04/12/2021   Secondary renal hyperparathyroidism (HCC) 03/19/2019   Diabetes (HCC) 07/03/2018   Vitamin D deficiency 04/11/2018   ED (erectile dysfunction) 04/10/2018   Lower urinary tract symptoms (LUTS) 02/09/2017   Hypertension 07/05/2015   Hyperglycemia due to type 2 diabetes mellitus (HCC) 07/05/2015   HLD (hyperlipidemia) 07/05/2015    Social History   Tobacco Use   Smoking status: Never   Smokeless tobacco: Never  Substance Use Topics   Alcohol use: Yes    Alcohol/week: 6.0 - 12.0 standard drinks of alcohol    Types: 6 - 12 Cans of beer per week    Comment: Occasional     Current Outpatient Medications:    ACCU-CHEK GUIDE TEST test strip, 1 EACH BY OTHER ROUTE IN THE MORNING AND AT BEDTIME. USE AS INSTRUCTED, Disp: 200 strip, Rfl: 0   Accu-Chek Softclix Lancets lancets, USE TO TEST BLOOD SUGAR UP TO FOUR  TIMES DAILY AS DIRECTED, Disp: 200 each, Rfl: 1   amLODipine (NORVASC) 10 MG tablet, Take 1 tablet (10 mg total) by mouth every evening., Disp: 90 tablet, Rfl: 1   Cholecalciferol (VITAMIN D3) 50 MCG (2000 UT) CAPS, Take 1 capsule (2,000 Units total) by mouth daily., Disp: 90 capsule, Rfl: 1   cyanocobalamin (VITAMIN B12) 1000 MCG tablet, Take 1 tablet (1,000 mcg total) by mouth daily., Disp: 90 tablet, Rfl: 1   empagliflozin  (JARDIANCE) 25 MG TABS tablet, Take 1 tablet (25 mg total) by mouth daily., Disp: 90 tablet, Rfl: 1   Finerenone (KERENDIA) 10 MG TABS, Take 1 tablet by mouth daily at 12 noon., Disp: , Rfl:    glipiZIDE (GLUCOTROL XL) 2.5 MG 24 hr tablet, Take 1 tablet (2.5 mg total) by mouth daily with breakfast., Disp: 90 tablet, Rfl: 1   hydrALAZINE (APRESOLINE) 10 MG tablet, Take 1 tablet (10 mg total) by mouth 3 (three) times daily. If bp above 140/90, Disp: 270 tablet, Rfl: 1   Iron, Ferrous Sulfate, 325 (65 Fe) MG TABS, Take 325 mg by mouth daily., Disp: 90 tablet, Rfl: 1   rosuvastatin (CRESTOR) 40 MG tablet, TAKE 1 TABLET BY MOUTH EVERY DAY, Disp: 90 tablet, Rfl: 1   Semaglutide (RYBELSUS) 14 MG TABS, Take 1 tablet (14 mg total) by mouth daily., Disp: 90 tablet, Rfl: 1   valsartan-hydrochlorothiazide (DIOVAN-HCT) 320-25 MG tablet, Take 1 tablet by mouth daily., Disp: 90 tablet, Rfl: 1  No Known Allergies  I personally reviewed active problem list, medication list, allergies, family history with the patient/caregiver today.  ROS  Ten systems reviewed and is negative except as mentioned in HPI    Objective  Virtual encounter, vitals not obtained.  There is no height or weight on file to calculate BMI.  Nursing Note and Vital Signs reviewed.  Physical Exam  Awake, alert and oriented   Assessment and Plan    Chronic Constipation  Likely secondary to decreased physical activity following above knee amputation in December 2023. Patient has tried Miralax and stool softeners with limited success. No blood in stool. Last colonoscopy in June 2024 with polyps found. -Start Trulance for constipation. Monitor for diarrhea as a side effect. -Increase dietary fiber intake. Consider over-the-counter Metamucil if needed. -Next colonoscopy due in 2026.  Above Knee Amputation due to PAD Patient is using a walker and cane for mobility. Patient is in the process of getting approval for a microprocessor  knee to improve mobility and activity level. -Continue current mobility aids and pursue approval for microprocessor knee.  Iron Supplementation Patient stopped taking iron due to concerns about constipation. Last hemoglobin level was 16.5. -Removed iron from medication list due to patient discontinuation. -Monitor hemoglobin levels and consider resuming iron supplementation if levels decrease.         There are no diagnoses linked to this encounter.  -Red flags and when to present for emergency care or RTC including fever >101.25F, chest pain, shortness of breath, new/worsening/un-resolving symptoms,  reviewed with patient at time of visit. Follow up and care instructions discussed and provided in AVS. - I discussed the assessment and treatment plan with the patient. The patient was provided an opportunity to ask questions and all were answered. The patient agreed with the plan and demonstrated an understanding of the instructions.  I provided 15  minutes of non-face-to-face time during this encounter.  Ruel Favors, MD

## 2024-01-08 NOTE — Therapy (Signed)
OUTPATIENT PHYSICAL THERAPY PROSTHETIC EVALUATION   Patient Name: [name] MRN: [id number] DOB:1966/08/18, 58 y.o., male Today's Date: 01/09/2024  END OF SESSION:  PT End of Session - 01/09/24 0913     Visit Number 1    Number of Visits 2    Date for PT Re-Evaluation 01/16/24    Authorization Type [employer] dual complete Medicare / Medicaid    PT Start Time 0755    PT Stop Time 0844    PT Time Calculation (min) 49 min    Equipment Utilized During Treatment Gait belt    Activity Tolerance Patient tolerated treatment well    Behavior During Therapy WFL for tasks assessed/performed             Past Medical History:  Diagnosis Date   Diabetes mellitus without complication (HCC)    Hyperlipidemia    Hypertension    Past Surgical History:  Procedure Laterality Date   AMPUTATION Right 04/16/2021   Procedure: AMPUTATION  RIGHT 5TH RAY, INCISION AND DRAINAGE;  Surgeon: Felecia Shelling, DPM;  Location: [facility];  Service: Podiatry;  Laterality: Right;   AMPUTATION Right 11/24/2022   Procedure: AMPUTATION ABOVE KNEE;  Surgeon: Renford Dills, MD;  Location: [facility];  Service: Vascular;  Laterality: Right;   APPLICATION OF WOUND VAC Right 04/21/2021   Procedure: APPLICATION OF WOUND VAC;  Surgeon: Felecia Shelling, DPM;  Location: [facility];  Service: Podiatry;  Laterality: Right;   BONE BIOPSY Left 06/27/2020   Procedure: BONE BIOPSY;  Surgeon: Rosetta Posner, DPM;  Location: [facility];  Service: Podiatry;  Laterality: Left;   COLONOSCOPY WITH PROPOFOL N/A 06/22/2022   Procedure: COLONOSCOPY WITH PROPOFOL;  Surgeon: Wyline Mood, MD;  Location: [facility];  Service: Gastroenterology;  Laterality: N/A;   I & D EXTREMITY Right 11/22/2022   Procedure: IRRIGATION AND DEBRIDEMENT EXTREMITY, below knee amputation right leg;  Surgeon: Leafy Ro, MD;  Location: [facility];  Service: General;  Laterality: Right;   INCISION AND DRAINAGE Left 06/27/2020   Procedure: INCISION AND DRAINAGE;   Surgeon: Rosetta Posner, DPM;  Location: [facility];  Service: Podiatry;  Laterality: Left;   IRRIGATION AND DEBRIDEMENT FOOT Right 04/21/2021   Procedure: IRRIGATION AND DEBRIDEMENT FOOT;  Surgeon: Felecia Shelling, DPM;  Location: [facility];  Service: Podiatry;  Laterality: Right;   LOWER EXTREMITY ANGIOGRAPHY Left 06/30/2020   Procedure: Lower Extremity Angiography;  Surgeon: Annice Needy, MD;  Location: [facility];  Service: Cardiovascular;  Laterality: Left;   LOWER EXTREMITY ANGIOGRAPHY Right 04/14/2021   Procedure: Lower Extremity Angiography;  Surgeon: Annice Needy, MD;  Location: [facility];  Service: Cardiovascular;  Laterality: Right;   NO PAST SURGERIES     WOUND DEBRIDEMENT Right 04/19/2021   Procedure: DEBRIDEMENT WOUND;  Surgeon: Felecia Shelling, DPM;  Location: [facility];  Service: Podiatry;  Laterality: Right;   Patient Active Problem List   Diagnosis Date Noted   Diabetes mellitus with proteinuria (HCC) 12/11/2023   Hypertension associated with diabetes (HCC) 12/11/2023   History of above knee amputation, right (HCC) 12/13/2022   Elevated troponin 11/21/2022   B12 deficiency 09/05/2022   Proliferative diabetic retinopathy of both eyes without macular edema associated with type 2 diabetes mellitus (HCC) 06/05/2022   PAD (peripheral artery disease) (HCC) 04/12/2021   Secondary renal hyperparathyroidism (HCC) 03/19/2019   Diabetes (HCC) 07/03/2018   Vitamin D deficiency 04/11/2018   ED (erectile dysfunction) 04/10/2018   Lower urinary tract  symptoms (LUTS) 02/09/2017   Hypertension 07/05/2015   Hyperglycemia due to type 2 diabetes mellitus (HCC) 07/05/2015   HLD (hyperlipidemia) 07/05/2015    PCP: Alba Cory, MD  REFERRING PROVIDER: Sheppard Plumber, NP  ONSET DATE: 12/28/2023 MD referral to PT  REFERRING DIAG: 78.111A (ICD-10-CM) - Unilateral AKA, right   THERAPY DIAG:  Other abnormalities of gait and mobility  Unsteadiness on feet  Rationale for  Evaluation and Treatment: Rehabilitation  SUBJECTIVE:   SUBJECTIVE STATEMENT: This 58yo male underwent a right Transfemoral Amputation on 11/24/2022. He underwent PT with current prosthesis and improved his mobility to community level.  He is limited by prosthesis in his ability to fully function in community like changing speed.  Patient would like to receive a Microprocessor Prosthesis to improve safety and mobility. He reports current socket cuts into pelvis.  Pt accompanied by: significant other  PERTINENT HISTORY: right TFA, DM2, HTN, HLD, retinopathy,   PAIN:  Are you having pain? No  PRECAUTIONS: None  WEIGHT BEARING RESTRICTIONS: No  FALLS: Has patient fallen in last 6 months? Yes. Number of falls 1 prosthetic knee buckled  LIVING ENVIRONMENT: Lives with: lives with their spouse Lives in: House Home Access: Ramped entrance Home layout: One level Stairs: Yes: External: 4 steps; can reach both Has following equipment at home: Single point cane, Environmental consultant - 2 wheeled, Wheelchair (manual), Tour manager, and Grab bars  OCCUPATION: on disability  PLOF: Independent no device for community level,  worked at [employer]  PATIENT GOALS:  to get Microprocessor Knee Prosthesis to be active in community, run & jump  OBJECTIVE:   COGNITION: Overall cognitive status: Within functional limits for tasks assessed  POSTURE: forward head  CARDIOVASCULAR RESPONSE: 01/09/2024 - Evaluation with current prosthesis: Functional activity: Functional Mobility Assessments below Pre-activity vitals: HR: 50 SpO2: 100% Post-activity vitals: HR: 62 SpO2: 100% Modified Borg scale for dyspnea: 2: mild shortness of breath  TRANSFERS: 01/09/2024 - Evaluation with current prosthesis: Sit to stand: Modified independence uses hands on mat table but able to stand without touching external support Stand to sit: Modified independence uses hands on mat table but able to stand without touching external  support  FUNCTIONAL TESTs:  01/09/2024 - Evaluation with current prosthesis: Timed Up & Go: std with RW & TFA prosthesis 23.70 sec; cognitive TUG (naming states) 24.13 sec able to continue naming during turns;  With [facility] & TFA prosthesis 34.13 sec;  GAIT: 01/09/2024 - Evaluation with current prosthesis: Gait pattern: step through pattern, decreased arm swing- Right, decreased step length- Left, decreased stance time- Right, Right hip hike, and trunk flexed Distance walked: >300' Assistive device utilized: Single point cane, Environmental consultant - 2 wheeled, and TFA prosthesis Level of assistance: Modified independence with RW, and with SPC CGA except 1 loss of balance fast pace with Min A to recover Gait velocity: self-selected comfortable pace: with RW comfortable / self-selected 1.81 ft/sec and fast pace 2.22 ft/sec; with SPC (CGA) comfortable / self-selected 1.16 ft/sec and fast pace 1.19 ft/sec (1 LOB MinA to recover);    AMPUTEE MOBILITY PREDICTOR ASSESSMENT TOOL Initial instructions: Client is seated in a hard chair with arms. The following manoeuvres are tested with or without the use of the prosthesis.  Advise the person of each task or group of tasks prior to performance.  Please avoid unnecessary chatter throughout the test.  Safety First, no task should be performed if either the tester or client is uncertain of a safe outcome.  TASK SCORING GUIDELINES SCORE OPTIONS EVALUATION  WITH CURRENT PROSTHESIS 01/09/2024 EVALUATION WITH MICROPROSCESSOR PROSTHESIS  1. Sitting Balance: Sit forward in a chair with arms folded across chest for 60s. Cannot sit upright independently for 60s Can sit upright independently for 60s = 0 = 1  1   2. Sitting reach:  Reach forwards and grasp the ruler.  (Tester holds ruler 12in beyond extended arms midline to the sternum) Does not attempt Cannot grasp or requires arm support Reaches forward and successfully grasps item.  = 0 = 1  = 2    2   3. Chair to chair  transfer: 2 chairs at 90. Pt. may choose direction and use their upper limbs. Cannot do or requires physical assistance Performs independently, but appears unsteady Performs independently, appears to be steady and safe = 0  = 1 = 2  2   4. Arises from a chair: Ask pt. to fold arms across chest and stand. If unable, use arms or assistive device. Unable without help (physical assistance) Able, uses arms/assist device to help Able, without using arms = 0  = 1 = 2   1   5. Attempts to arise from a chair: (stopwatch ready) If attempt in no. 4. was without arms then ignore and allow another attempt without penalty. Unable without help (physical assistance) Able requires >1 attempt Able to rise one attempt = 0  = 1 = 2   2   6. Immediate Standing Balance: (first 5s) Begin timing immediately. Unsteady (staggers, moves foot, sways ) Steady using walking aid or other support Steady without walker or other support = 0 = 1  = 2  2   7. Standing Balance (30s): (stopwatch ready) For item no.'s 7 & 8, first attempt is without assistive device.  If support is required allow after first attempt Unsteady Steady but uses walking aid or other support Standing without support = 0  = 1 = 2    2   8. Single limb standing balance: (stopwatch ready) Time the duration of single limb standing on both the sound and prosthetic limb up to 30s.   Grade the quality, not the time.  *Eliminate item 8 for AMPnoPRO*  Sound side  30 seconds  Prosthetic side 30 seconds Non-prosthetic side Unsteady Steady but uses walking aid or other support for 30s Single-limb standing without support for 30s  Prosthetic Side Unsteady Steady but uses walking aid or other support for 30s Single-limb standing without support for 30s  = 0 = 1  = 2    = 0  = 1  = 2    1     1    9. Standing reach: Reach forward and grasp the ruler.  (Tester holds ruler 12in beyond extended arm(s) midline to  the sternum) Does not attempt Cannot grasp or requires arm support on assistive device Reaches forward and successfully grasps item no support = 0  = 1  = 2   2   10. Nudge test: With feet as close together as possible, examiner pushes lightly on pt.'s sternum with palm of hand 3 times (toes should rise) Begins to fall Staggers, grabs, catches self ore uses assistive device Steady = 0  = 1 = 2   2   11. Eyes Closed: (at maximum position #7) If support is required grade as unsteady. Unsteady or grips assistive device Steady without any use of assistive device = 0  = 1  1     12. Pick up objects off the floor:  Pick up a pencil off the floor placed midline 12in in front of foot. Unable to pick up object and return to standing Performs with some help (table, chair, walking aid etc) Performs independently (without help) = 0  = 1   = 2  2   13. Sitting down:  Ask pt. to fold arms across chest and sit. If unable, use arm or assistive device. Unsafe (misjudged distance, falls into chair ) Uses arms, assistive device or not a smooth motion Safe, smooth motion = 0  = 1 = 2   1   14. Initiation of gait: (immediately after told to "go") Any hesitancy or multiple attempts to start No hesitancy = 0 = 1  0   15. Step length and height: Walk a measured distance of 37ft twice (up and back). Four scores are required or two scores (a. & b.) for each leg. "Marked deviation" is defined as extreme substitute movements to avoid clearing the floor. a. Swing Foot Does not advance a minimum of 12in Advances a minimum of 12in  b. Foot Clearance Foot does not completely clear floor without deviation Foot completely clears floor without marked deviation  = 0  = 1   = 0 = 1 Prosthesis  1   1 Sound  0   1 Prosthesis      Sound         16. Step Continuity Stopping or discontinuity between steps (stop & go gait) Steps appear continuous = 0  = 1  1   17.  Turning:  180 degree turn when returning to chair. Unable to turn, requires intervention to prevent falling Greater than three steps but completes task without intervention No more than three continuous steps with or without assistive aid = 0  = 1  = 2    1   18. Variable cadence:  Walk a distance of 36ft fast as possible safely 4 times.  (Speeds may vary from slow to fast and fast to slow varying cadence) Unable to vary cadence in a controlled manner Asymmetrical increase in cadence controlled manner Symmetrical increase in speed in a controlled manner  = 0 = 1 = 2      1   19. Stepping over an obstacle: Place a movable box of 4in in height in the walking path. Cannot step over the box Catches foot, interrupts stride Steps over without interrupting stride = 0 = 1 = 2   1   20. Stairs (must have at least 2 steps):  Try to go up and down these stairs without holding on to the railing.  Don't hesitate to permit pt. to hold on to rail.  Safety First, if examiner feels that any risk in involved omit and score as 0.  Ascending Unsteady, cannot do One step at a time, or must hold on to railing or device Step over step, does not hold onto the railing or device  Descending Unsteady, cannot do One step at a time, or must hold on to railing or device Step over step, does not hold onto the railing or device  = 0  = 1 = 2    = 0  = 1 = 2   1     1    21. Assistive device selection:  Add points for the use of an assistive device if used for two or more items.  If testing without prosthesis use of appropriate assistive device is mandatory.   Bed bound Wheelchair /  Parallel Bars Walker Crutches (axillary or forearm) Cane (straight or quad) None = 0 = 1 = 2 = 3 = 4 = 5    2     Total Score                                AMPPRO      31 /47 AMPPRO       /47   K LEVEL (converted from AMP score)  AMPPRO    K1 = (15-26)      K2 = (27-36)     K3 = (37-42)      K4 = (43-47)   RAMP  01/09/2024 - Evaluation with current prosthesis:Modified independence RW & TFA prosthesis  STAIRS:  01/09/2024 - Evaluation with current prosthesis: SBA / needed verbal cues for technique with right rail & LUE cane  CURRENT PROSTHETIC WEAR ASSESSMENT: 01/09/2024 - Evaluation with current prosthesis: Patient is independent with: skin check, residual limb care, care of non-amputated limb, prosthetic cleaning, ply sock cleaning, correct ply sock adjustment, and proper wear schedule/adjustment Donning prosthesis: Modified independence Doffing prosthesis: Modified independence Prosthetic wear tolerance: reports wear most of awake hours every day Prosthetic weight bearing tolerance: >20 minutes during testing with no c/o limb pain;  pt does report pelvis pain later in day when limb settles deeper in socket.   Residual limb condition: patient denies any issues. Prosthetic description: ischial containment socket with flexible inner socket, silicon liner with velcro lanyard suspension, multiaxial knee K2, K2 foot PF resisistance K code/activity level with prosthetic use: Level 3 (current is K2 limited community but POTENTIAL is K3 full community with variable cadence)    TODAY'S TREATMENT:                                                                                                                             DATE:  01/09/2024: PT educated pt & his wife on POC and safety with switching knees.  Pt verbalized understanding.    ASSESSMENT:  CLINICAL IMPRESSION: Patient is a 58 y.o. male who was seen today for physical therapy evaluation for potential to utilize a Microprocessor Knee (MPK) Prosthesis.  PT performed multiple functional outcome tests with current prosthesis today.  Patient is currently functioning at K2 basic community with fixed cadence and is dependent on RW for community activities. He reports using cane in home.  Patient has POTENTIAL with a Microprocessor Knee  to function at K3 full community with variable cadence.  Plan is to put a loaner MPK on his socket for >3 days to enable him to accommodate to the difference in knees. Then PT to perform functional outcome tests again with no PT training.  If mobility including decreased times and safey improve, then he should have improved mobility and safety with training how to maximize functions of MPK.  A MPK would enable stumble recovery to decrease falls and  improve knee stability in balance & gait with less UE support.    OBJECTIVE IMPAIRMENTS: Abnormal gait, decreased activity tolerance, decreased balance, decreased knowledge of use of DME, decreased mobility, and prosthetic dependency .   ACTIVITY LIMITATIONS: carrying, lifting, standing, squatting, stairs, transfers, and locomotion level  PARTICIPATION LIMITATIONS: meal prep, cleaning, and community activity  PERSONAL FACTORS: Time since onset of injury/illness/exacerbation and 3+ comorbidities: see PMH  are also affecting patient's functional outcome.   REHAB POTENTIAL: Good  CLINICAL DECISION MAKING: Evolving/moderate complexity  EVALUATION COMPLEXITY: Moderate  GOALS: Goals reviewed with patient? Yes  LONG TERM GOALS: Target date: 01/16/2024  Patient demonstrates & verbalized understanding of prosthetic recommendations. Baseline: SEE OBJECTIVE DATA Goal status: INITIAL   PLAN:  PT FREQUENCY: 1x/week  PT DURATION: 2 weeks  PLANNED INTERVENTIONS: 16109- PT Re-evaluation, [phone]- Prosthetic training, and Patient/Family education  PLAN FOR NEXT SESSION: perform Functional Outcome Measures with MPK prosthesis   Vladimir Faster, PT, DPT 01/09/2024, 9:43 AM

## 2024-01-09 ENCOUNTER — Encounter: Payer: Self-pay | Admitting: Physical Therapy

## 2024-01-09 ENCOUNTER — Ambulatory Visit: Payer: 59 | Attending: Family Medicine | Admitting: Physical Therapy

## 2024-01-09 ENCOUNTER — Other Ambulatory Visit: Payer: Self-pay

## 2024-01-09 DIAGNOSIS — R2681 Unsteadiness on feet: Secondary | ICD-10-CM | POA: Diagnosis not present

## 2024-01-09 DIAGNOSIS — R2689 Other abnormalities of gait and mobility: Secondary | ICD-10-CM | POA: Insufficient documentation

## 2024-01-16 ENCOUNTER — Encounter: Payer: Self-pay | Admitting: Physical Therapy

## 2024-01-16 ENCOUNTER — Ambulatory Visit: Payer: 59 | Attending: Family Medicine | Admitting: Physical Therapy

## 2024-01-16 DIAGNOSIS — R269 Unspecified abnormalities of gait and mobility: Secondary | ICD-10-CM | POA: Insufficient documentation

## 2024-01-16 DIAGNOSIS — M6281 Muscle weakness (generalized): Secondary | ICD-10-CM | POA: Insufficient documentation

## 2024-01-16 DIAGNOSIS — R2681 Unsteadiness on feet: Secondary | ICD-10-CM | POA: Diagnosis not present

## 2024-01-16 DIAGNOSIS — R2689 Other abnormalities of gait and mobility: Secondary | ICD-10-CM | POA: Diagnosis not present

## 2024-01-16 DIAGNOSIS — R262 Difficulty in walking, not elsewhere classified: Secondary | ICD-10-CM | POA: Insufficient documentation

## 2024-01-16 NOTE — Therapy (Signed)
 OUTPATIENT PHYSICAL THERAPY PROSTHETIC RE-EVALUATION & DISCHARGE SUMMARY   Patient Name: [name] MRN: [id number] DOB:October 05, 1966, 58 y.o., male Today's Date: 01/16/2024  PHYSICAL THERAPY DISCHARGE SUMMARY  Visits from Start of Care: 2  Current functional level related to goals / functional outcomes: LTG met   Remaining deficits: See functional outcomes below.    Education / Equipment: Patient appears to understand PT recommendations for MPK prosthesis and is in agreement   Patient agrees to discharge. Patient goals were met. Patient is being discharged due to meeting the stated rehab goals.   END OF SESSION:  PT End of Session - 01/16/24 0800     Visit Number 2    Number of Visits 2    Date for PT Re-Evaluation 01/16/24    Authorization Type [employer] dual complete Medicare / Medicaid    PT Start Time 0800    PT Stop Time 0844    PT Time Calculation (min) 44 min    Equipment Utilized During Treatment Gait belt    Activity Tolerance Patient tolerated treatment well    Behavior During Therapy WFL for tasks assessed/performed              Past Medical History:  Diagnosis Date   Diabetes mellitus without complication (HCC)    Hyperlipidemia    Hypertension    Past Surgical History:  Procedure Laterality Date   AMPUTATION Right 04/16/2021   Procedure: AMPUTATION  RIGHT 5TH RAY, INCISION AND DRAINAGE;  Surgeon: Janit Thresa HERO, DPM;  Location: [facility];  Service: Podiatry;  Laterality: Right;   AMPUTATION Right 11/24/2022   Procedure: AMPUTATION ABOVE KNEE;  Surgeon: Jama Cordella MATSU, MD;  Location: [facility];  Service: Vascular;  Laterality: Right;   APPLICATION OF WOUND VAC Right 04/21/2021   Procedure: APPLICATION OF WOUND VAC;  Surgeon: Janit Thresa HERO, DPM;  Location: [facility];  Service: Podiatry;  Laterality: Right;   BONE BIOPSY Left 06/27/2020   Procedure: BONE BIOPSY;  Surgeon: Lennie Barter, DPM;  Location: [facility];  Service: Podiatry;  Laterality: Left;    COLONOSCOPY WITH PROPOFOL  N/A 06/22/2022   Procedure: COLONOSCOPY WITH PROPOFOL ;  Surgeon: Therisa Bi, MD;  Location: [facility];  Service: Gastroenterology;  Laterality: N/A;   I & D EXTREMITY Right 11/22/2022   Procedure: IRRIGATION AND DEBRIDEMENT EXTREMITY, below knee amputation right leg;  Surgeon: Jordis Laneta FALCON, MD;  Location: [facility];  Service: General;  Laterality: Right;   INCISION AND DRAINAGE Left 06/27/2020   Procedure: INCISION AND DRAINAGE;  Surgeon: Lennie Barter, DPM;  Location: [facility];  Service: Podiatry;  Laterality: Left;   IRRIGATION AND DEBRIDEMENT FOOT Right 04/21/2021   Procedure: IRRIGATION AND DEBRIDEMENT FOOT;  Surgeon: Janit Thresa HERO, DPM;  Location: [facility];  Service: Podiatry;  Laterality: Right;   LOWER EXTREMITY ANGIOGRAPHY Left 06/30/2020   Procedure: Lower Extremity Angiography;  Surgeon: Marea Selinda RAMAN, MD;  Location: [facility];  Service: Cardiovascular;  Laterality: Left;   LOWER EXTREMITY ANGIOGRAPHY Right 04/14/2021   Procedure: Lower Extremity Angiography;  Surgeon: Marea Selinda RAMAN, MD;  Location: [facility];  Service: Cardiovascular;  Laterality: Right;   NO PAST SURGERIES     WOUND DEBRIDEMENT Right 04/19/2021   Procedure: DEBRIDEMENT WOUND;  Surgeon: Janit Thresa HERO, DPM;  Location: [facility];  Service: Podiatry;  Laterality: Right;   Patient Active Problem List   Diagnosis Date Noted   Diabetes mellitus with proteinuria (HCC) 12/11/2023   Hypertension associated with diabetes (HCC)  12/11/2023   History of above knee amputation, right (HCC) 12/13/2022   Elevated troponin 11/21/2022   B12 deficiency 09/05/2022   Proliferative diabetic retinopathy of both eyes without macular edema associated with type 2 diabetes mellitus (HCC) 06/05/2022   PAD (peripheral artery disease) (HCC) 04/12/2021   Secondary renal hyperparathyroidism (HCC) 03/19/2019   Diabetes (HCC) 07/03/2018   Vitamin D  deficiency 04/11/2018   ED (erectile dysfunction)  04/10/2018   Lower urinary tract symptoms (LUTS) 02/09/2017   Hypertension 07/05/2015   Hyperglycemia due to type 2 diabetes mellitus (HCC) 07/05/2015   HLD (hyperlipidemia) 07/05/2015    PCP: Sowles, Krichna, MD  REFERRING PROVIDER: Orvin Daring, NP  ONSET DATE: 12/28/2023 MD referral to PT  REFERRING DIAG: 78.111A (ICD-10-CM) - Unilateral AKA, right   THERAPY DIAG:  Other abnormalities of gait and mobility  Unsteadiness on feet  Abnormality of gait and mobility  Muscle weakness (generalized)  Difficulty in walking, not elsewhere classified  Rationale for Evaluation and Treatment: Rehabilitation  SUBJECTIVE:   SUBJECTIVE STATEMENT: 01/16/2024: Prosthetist placed a loaner Microprocessor Knee unit on patient's socket on 01/10/2023 afternoon so he has had 5 days to accommodate to differences with no training.  He noticed it buckled but did not fall.  The MPK feels more secure and he can trust it. He went to [employer] pushing shopping cart and MPK provided more trust.  He can use cane better.  He feels safer.  He knows he will need training but it helps. The other knee feels like a car with no brakes.  He thinks this knee will let him be more active including returning to exercises.    01/09/2024:   This 58yo male underwent a right Transfemoral Amputation on 11/24/2022. He underwent PT with current prosthesis and improved his mobility to community level.  He is limited by prosthesis in his ability to fully function in community like changing speed.  Patient would like to receive a Microprocessor Prosthesis to improve safety and mobility. He reports current socket cuts into pelvis.  Pt accompanied by: significant other  PERTINENT HISTORY: right TFA, DM2, HTN, HLD, retinopathy,   PAIN:  Are you having pain? No  PRECAUTIONS: None  WEIGHT BEARING RESTRICTIONS: No  FALLS: Has patient fallen in last 6 months? Yes. Number of falls 1 prosthetic knee buckled  LIVING ENVIRONMENT: Lives  with: lives with their spouse Lives in: House Home Access: Ramped entrance Home layout: One level Stairs: Yes: External: 4 steps; can reach both Has following equipment at home: Single point cane, Environmental Consultant - 2 wheeled, Wheelchair (manual), Tour manager, and Grab bars  OCCUPATION: on disability  PLOF: Independent no device for community level,  worked at [employer]  PATIENT GOALS:  to get Microprocessor Knee Prosthesis to be active in community, run & jump  OBJECTIVE:   COGNITION: Overall cognitive status: Within functional limits for tasks assessed  POSTURE: forward head  CARDIOVASCULAR RESPONSE: 01/16/2024 - Re-evaluation with MPK prosthesis  Functional activity: Functional Mobility Assessments below Pre-activity vitals: HR: 62 SpO2: 100% Post-activity vitals: HR: 67 SpO2: 100% Modified Borg scale for dyspnea:   1: very mild shortness of breath   01/09/2024:  Evaluation with current prosthesis: Functional activity: Functional Mobility Assessments below Pre-activity vitals: HR: 50 SpO2: 100% Post-activity vitals: HR: 62 SpO2: 100% Modified Borg scale for dyspnea: 2: mild shortness of breath  TRANSFERS: 01/16/2024 - Re-evaluation with MPK prosthesis  Sit to stand: Modified independence uses left hand on mat table but able to stabilize without touching external support  Stand to sit: Modified independence uses hands on mat table but able to stand without touching external support  01/09/2024:  Evaluation with current prosthesis: Sit to stand: Modified independence uses hands on mat table but able to stand without touching external support Stand to sit: Modified independence uses hands on mat table but able to stand without touching external support  FUNCTIONAL TESTs:  01/16/2024 - Evaluation with MPK prosthesis: Timed Up & Go: std with RW & TFA prosthesis 23.97 sec; cognitive TUG (naming states) 23.28 sec able to continue naming during turns;  With [facility] & TFA prosthesis 30.53 sec;  He had one stumble recovery with MPK not buckling with ability to easily self-correct  01/09/2024:  Evaluation with current prosthesis: Timed Up & Go: std with RW & TFA prosthesis 23.70 sec; cognitive TUG (naming states) 24.13 sec able to continue naming during turns;  With [facility] & TFA prosthesis 34.13 sec;  GAIT: 01/16/2024 - Re-evaluation with MPK prosthesis  Gait pattern:  step through pattern, decreased arm swing- Right, decreased step length- Left, decreased stance time- Right, upright trunk Distance walked: >400' Assistive device utilized: Single point cane, Environmental Consultant - 2 wheeled, and TFA prosthesis Level of assistance: Modified independence with RW, and with SPC CGA (4 stumble recoveries during session that patient able to self-correct safely due to MPK functions). Gait velocity: self-selected comfortable pace: with RW comfortable / self-selected 1.71 ft/sec and fast pace 2.19 ft/sec; with SPC (CGA) comfortable / self-selected 1.22 ft/sec and fast pace 1.38 ft/sec no LOBs;    Evaluation with current prosthesis: Gait pattern: step through pattern, decreased arm swing- Right, decreased step length- Left, decreased stance time- Right, Right hip hike, and trunk flexed Distance walked: >300' Assistive device utilized: Single point cane, Environmental Consultant - 2 wheeled, and TFA prosthesis Level of assistance: Modified independence with RW, and with SPC CGA except 1 loss of balance fast pace with Min A to recover Gait velocity: self-selected comfortable pace: with RW comfortable / self-selected 1.81 ft/sec and fast pace 2.22 ft/sec; with SPC (CGA) comfortable / self-selected 1.16 ft/sec and fast pace 1.19 ft/sec (1 LOB MinA to recover);    AMPUTEE MOBILITY PREDICTOR ASSESSMENT TOOL Initial instructions: Client is seated in a hard chair with arms. The following manoeuvres are tested with or without the use of the prosthesis.  Advise the person of each task or group of tasks prior to performance.  Please avoid  unnecessary chatter throughout the test.  Safety First, no task should be performed if either the tester or client is uncertain of a safe outcome.  TASK SCORING GUIDELINES SCORE OPTIONS EVALUATION WITH CURRENT PROSTHESIS 01/09/2024 RE-EVALUATION WITH MICROPROSCESSOR PROSTHESIS 01/16/2024  1. Sitting Balance: Sit forward in a chair with arms folded across chest for 60s. Cannot sit upright independently for 60s Can sit upright independently for 60s = 0 = 1  1 1   2. Sitting reach:  Reach forwards and grasp the ruler.  (Tester holds ruler 12in beyond extended arms midline to the sternum) Does not attempt Cannot grasp or requires arm support Reaches forward and successfully grasps item.  = 0 = 1  = 2    2 2   3. Chair to chair transfer: 2 chairs at 90. Pt. may choose direction and use their upper limbs. Cannot do or requires physical assistance Performs independently, but appears unsteady Performs independently, appears to be steady and safe = 0  = 1 = 2  2 2   4. Arises from a chair: Ask pt. to fold arms  across chest and stand. If unable, use arms or assistive device. Unable without help (physical assistance) Able, uses arms/assist device to help Able, without using arms = 0  = 1 = 2   1 1   5. Attempts to arise from a chair: (stopwatch ready) If attempt in no. 4. was without arms then ignore and allow another attempt without penalty. Unable without help (physical assistance) Able requires >1 attempt Able to rise one attempt = 0  = 1 = 2   2 2   6. Immediate Standing Balance: (first 5s) Begin timing immediately. Unsteady (staggers, moves foot, sways ) Steady using walking aid or other support Steady without walker or other support = 0 = 1  = 2  2 2   7. Standing Balance (30s): (stopwatch ready) For item no.'s 7 & 8, first attempt is without assistive device.  If support is required allow after first attempt Unsteady Steady but uses walking aid or other support Standing  without support = 0  = 1 = 2    2 2   8. Single limb standing balance: (stopwatch ready) Time the duration of single limb standing on both the sound and prosthetic limb up to 30s.   Grade the quality, not the time.  *Eliminate item 8 for AMPnoPRO*  Sound side  30 seconds  Prosthetic side 30 seconds Non-prosthetic side Unsteady Steady but uses walking aid or other support for 30s Single-limb standing without support for 30s  Prosthetic Side Unsteady Steady but uses walking aid or other support for 30s Single-limb standing without support for 30s  = 0 = 1  = 2    = 0  = 1  = 2    1     1 1     1   9. Standing reach: Reach forward and grasp the ruler.  (Tester holds ruler 12in beyond extended arm(s) midline to the sternum) Does not attempt Cannot grasp or requires arm support on assistive device Reaches forward and successfully grasps item no support = 0  = 1  = 2   2 2   10. Nudge test: With feet as close together as possible, examiner pushes lightly on pt.'s sternum with palm of hand 3 times (toes should rise) Begins to fall Staggers, grabs, catches self ore uses assistive device Steady = 0  = 1 = 2   2 2   11. Eyes Closed: (at maximum position #7) If support is required grade as unsteady. Unsteady or grips assistive device Steady without any use of assistive device = 0  = 1  1  1    12. Pick up objects off the floor: Pick up a pencil off the floor placed midline 12in in front of foot. Unable to pick up object and return to standing Performs with some help (table, chair, walking aid etc) Performs independently (without help) = 0  = 1   = 2  2 2   13. Sitting down:  Ask pt. to fold arms across chest and sit. If unable, use arm or assistive device. Unsafe (misjudged distance, falls into chair ) Uses arms, assistive device or not a smooth motion Safe, smooth motion = 0  = 1 = 2   1 2   14. Initiation of gait: (immediately after told  to "go") Any hesitancy or multiple attempts to start No hesitancy = 0 = 1  0 1  15. Step length and height: Walk a measured distance of 25ft twice (up and back). Four scores are required or two scores (  a. & b.) for each leg. "Marked deviation" is defined as extreme substitute movements to avoid clearing the floor. a. Swing Foot Does not advance a minimum of 12in Advances a minimum of 12in  b. Foot Clearance Foot does not completely clear floor without deviation Foot completely clears floor without marked deviation  = 0  = 1   = 0 = 1 Prosthesis  1   1 Sound  0   1 Prosthesis  1   1 Sound  1  1        16. Step Continuity Stopping or discontinuity between steps (stop & go gait) Steps appear continuous = 0  = 1  1 1   17. Turning:  180 degree turn when returning to chair. Unable to turn, requires intervention to prevent falling Greater than three steps but completes task without intervention No more than three continuous steps with or without assistive aid = 0  = 1  = 2    1 2   18. Variable cadence:  Walk a distance of 43ft fast as possible safely 4 times.  (Speeds may vary from slow to fast and fast to slow varying cadence) Unable to vary cadence in a controlled manner Asymmetrical increase in cadence controlled manner Symmetrical increase in speed in a controlled manner  = 0 = 1 = 2      1 1   19. Stepping over an obstacle: Place a movable box of 4in in height in the walking path. Cannot step over the box Catches foot, interrupts stride Steps over without interrupting stride = 0 = 1 = 2   1 1   20. Stairs (must have at least 2 steps):  Try to go up and down these stairs without holding on to the railing.  Don't hesitate to permit pt. to hold on to rail.  Safety First, if examiner feels that any risk in involved omit and score as 0.  Ascending Unsteady, cannot do One step at a time, or must hold on to railing or device Step over step,  does not hold onto the railing or device  Descending Unsteady, cannot do One step at a time, or must hold on to railing or device Step over step, does not hold onto the railing or device  = 0  = 1 = 2    = 0  = 1 = 2   1     1 1   1    21. Assistive device selection:  Add points for the use of an assistive device if used for two or more items.  If testing without prosthesis use of appropriate assistive device is mandatory.   Bed bound Wheelchair / Parallel Bars Walker Crutches (axillary or forearm) Cane (straight or quad) None = 0 = 1 = 2 = 3 = 4 = 5    2 4     Total Score                                AMPPRO      31 /47 AMPPRO     37  /47   K LEVEL (converted from AMP score)  AMPPRO    K1 = (15-26)      K2 = (27-36)     K3 = (37-42)     K4 = (43-47)   RAMP  01/16/2024 - Re-evaluation with MPK prosthesis CGA with cane & TFA prosthesis  01/09/2024:  Evaluation with current  prosthesis:Modified independence RW & TFA prosthesis  STAIRS:  01/16/2024 - Re-evaluation with MPK prosthesis modified independent with LUE cane & right rail step-to pattern.   PT instructed pt in descending using MPK hydraulics - 2 steps with CGA during instruction, then 2 steps able to demonstrate knee control with 2 rails step-to riding hydraulics.   01/09/2024:  Evaluation with current prosthesis: SBA / needed verbal cues for technique with right rail & LUE cane  CURRENT PROSTHETIC WEAR ASSESSMENT: 01/16/2024 - Re-evaluation with MPK prosthesis  Patient has worn prosthesis with MPK >90% of awake hours daily for last 5 days without issues.  He tolerated >30 min in clinic and reports >1 hour weight bearing without issues.    K code/activity level with prosthetic use: Level 3 MPK testing indicates functioning after 5 days with no training at beginning levels of K3 (full community distances with ability to vary cadence).   01/09/2024:  Evaluation with current prosthesis: Patient is  independent with: skin check, residual limb care, care of non-amputated limb, prosthetic cleaning, ply sock cleaning, correct ply sock adjustment, and proper wear schedule/adjustment Donning prosthesis: Modified independence Doffing prosthesis: Modified independence Prosthetic wear tolerance: reports wear most of awake hours every day Prosthetic weight bearing tolerance: >20 minutes during testing with no c/o limb pain;  pt does report pelvis pain later in day when limb settles deeper in socket.   Residual limb condition: patient denies any issues. Prosthetic description: ischial containment socket with flexible inner socket, silicon liner with velcro lanyard suspension, multiaxial knee K2, K2 foot PF resisistance K code/activity level with prosthetic use: Level 3 (current is K2 limited community but POTENTIAL is K3 full community with variable cadence)    TODAY'S TREATMENT:                                                                                                                             DATE:  01/16/2024: Prosthetic Training with TFA MPK prosthesis: PT instructed pt in descending using MPK hydraulics - 2 steps with CGA during instruction, then 2 steps able to demonstrate knee control with 2 rails step-to riding hydraulics.  PT demo & verbal cues on stepping over obstacle.  Pt able to return demo stepping over obstacle cane support.   01/09/2024: PT educated pt & his wife on POC and safety with switching knees.  Pt verbalized understanding.    ASSESSMENT:  CLINICAL IMPRESSION: 01/16/2024 Re-evaluation with MPK prosthesis:  Patient appears to have excellent potential to utilize a Microprocessor Knee (MPK) prosthesis to function at full community level with variable cadence (K3).  He will need PT prosthetic training to maximize his function with a MPK prosthesis.  His gait velocity and functional testing improved with only change to MPK and no training.  He was safer with MPK and able to  use cane for activities.  He had 4 stumbles (knee flexion moments) that MPK able to stop flexion moment to enable him to catch balance  efficiently without assistance.  His non-MPK would continue to flex and without external support or assist to prevent a fall.   AmpPro test score improved to 37/47 which is beginning of K3 level. AmpPro with his non-MPK was 31/47.  PT would expect all his functional outcome scores to improve with formal training.  PT recommends that this patient gets a Microprocessor Knee to maximize his function at community level with cane or less.     01/09/2024:  Eval on Patient is a 58 y.o. male who was seen today for physical therapy evaluation for potential to utilize a Microprocessor Knee (MPK) Prosthesis.  PT performed multiple functional outcome tests with current prosthesis today.  Patient is currently functioning at K2 basic community with fixed cadence and is dependent on RW for community activities. He reports using cane in home.  Patient has POTENTIAL with a Microprocessor Knee to function at K3 full community with variable cadence.  Plan is to put a loaner MPK on his socket for >3 days to enable him to accommodate to the difference in knees. Then PT to perform functional outcome tests again with no PT training.  If mobility including decreased times and safey improve, then he should have improved mobility and safety with training how to maximize functions of MPK.  A MPK would enable stumble recovery to decrease falls and improve knee stability in balance & gait with less UE support.    OBJECTIVE IMPAIRMENTS: Abnormal gait, decreased activity tolerance, decreased balance, decreased knowledge of use of DME, decreased mobility, and prosthetic dependency .   ACTIVITY LIMITATIONS: carrying, lifting, standing, squatting, stairs, transfers, and locomotion level  PARTICIPATION LIMITATIONS: meal prep, cleaning, and community activity  PERSONAL FACTORS: Time since onset of  injury/illness/exacerbation and 3+ comorbidities: see PMH  are also affecting patient's functional outcome.   REHAB POTENTIAL: Good  CLINICAL DECISION MAKING: Evolving/moderate complexity  EVALUATION COMPLEXITY: Moderate  GOALS: Goals reviewed with patient? Yes  LONG TERM GOALS: Target date: 01/16/2024  Patient demonstrates & verbalized understanding of prosthetic recommendations. Baseline: SEE OBJECTIVE DATA Goal status: MET 01/16/2024   PLAN:  PT FREQUENCY: 1x/week  PT DURATION: 2 weeks  PLANNED INTERVENTIONS: [id number]- PT Re-evaluation, [phone]- Prosthetic training, and Patient/Family education  PLAN FOR NEXT SESSION: discharge PT.  If he gets MPK, then please refer him to PT for training to maximize benefits of the MPK and his mobility.    Koby Hartfield, PT, DPT 01/16/2024, 9:53 AM

## 2024-03-02 ENCOUNTER — Other Ambulatory Visit: Payer: Self-pay | Admitting: Family Medicine

## 2024-03-02 DIAGNOSIS — E1169 Type 2 diabetes mellitus with other specified complication: Secondary | ICD-10-CM

## 2024-03-07 ENCOUNTER — Ambulatory Visit (INDEPENDENT_AMBULATORY_CARE_PROVIDER_SITE_OTHER): Payer: 59 | Admitting: Family Medicine

## 2024-03-07 ENCOUNTER — Encounter: Payer: Self-pay | Admitting: Family Medicine

## 2024-03-07 ENCOUNTER — Other Ambulatory Visit: Payer: Self-pay | Admitting: Family Medicine

## 2024-03-07 VITALS — BP 146/84 | HR 71 | Resp 16 | Ht 71.0 in | Wt 195.6 lb

## 2024-03-07 DIAGNOSIS — I152 Hypertension secondary to endocrine disorders: Secondary | ICD-10-CM

## 2024-03-07 DIAGNOSIS — E1169 Type 2 diabetes mellitus with other specified complication: Secondary | ICD-10-CM | POA: Diagnosis not present

## 2024-03-07 DIAGNOSIS — E785 Hyperlipidemia, unspecified: Secondary | ICD-10-CM | POA: Diagnosis not present

## 2024-03-07 DIAGNOSIS — E1159 Type 2 diabetes mellitus with other circulatory complications: Secondary | ICD-10-CM

## 2024-03-07 DIAGNOSIS — Z89611 Acquired absence of right leg above knee: Secondary | ICD-10-CM | POA: Diagnosis not present

## 2024-03-07 DIAGNOSIS — E1129 Type 2 diabetes mellitus with other diabetic kidney complication: Secondary | ICD-10-CM

## 2024-03-07 DIAGNOSIS — I10 Essential (primary) hypertension: Secondary | ICD-10-CM

## 2024-03-07 DIAGNOSIS — N2581 Secondary hyperparathyroidism of renal origin: Secondary | ICD-10-CM | POA: Diagnosis not present

## 2024-03-07 DIAGNOSIS — I739 Peripheral vascular disease, unspecified: Secondary | ICD-10-CM

## 2024-03-07 DIAGNOSIS — E559 Vitamin D deficiency, unspecified: Secondary | ICD-10-CM

## 2024-03-07 DIAGNOSIS — Z7984 Long term (current) use of oral hypoglycemic drugs: Secondary | ICD-10-CM

## 2024-03-07 DIAGNOSIS — R809 Proteinuria, unspecified: Secondary | ICD-10-CM

## 2024-03-07 LAB — POCT GLYCOSYLATED HEMOGLOBIN (HGB A1C): Hemoglobin A1C: 6.5 % — AB (ref 4.0–5.6)

## 2024-03-07 MED ORDER — NEBIVOLOL HCL 10 MG PO TABS: 10.0000 mg | ORAL_TABLET | Freq: Every day | ORAL | 0 refills | Status: DC

## 2024-03-07 NOTE — Progress Notes (Signed)
 Name: [name]   MRN: [id number]    DOB: October 08, 1966   Date:03/07/2024       Progress Note  Subjective  Chief Complaint  Chief Complaint  Patient presents with   Medical Management of Chronic Issues   HPI   Dyslipidemia: taking Crestor 40 mg  daily, Last LDL was above goal again at 95. Goal for him is below 70. 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  He has a prosthesis now and is doing well , he has a new prosthesis and plans on exercising more   Anxiety: he is very passionate about out food supply, insurances, seems anxious and frustrated, asked if he would like to take medication for anxiety , but he is not interested    Uncontrolled HTN/white coat : currently taking valsartan hctz 320/25 mg daily and norvasc 10 mg,, he also takes hydralazine just at night, discussed taking it three times a day. He does not want to take norvasc because he states it does not work and causes fluid retention. We will try to switch to Bystolic 10 mg daily and return in 2 weeks for bp check with his bp monitor.    CKI stage I , DM with proteinuria  with secondary hyperparathyroidism : under the care of nephrologist , recently started on Kerendia, tolerating it well .    DMII: A1C is at goal now,  He is taking Jardiance, Rybelsus and Glipizide 2.5 mg now. He denies polyphagia, polydipsia or polyuria. He has associated PAD, diabetic retinopathy, dyslipidemia and microalbuminuria. He is on statin therapy, ARB and aspirin daily . He takes ARB for macroalbuminuria   B12 and vitamin D deficiency : continue supplementation as recommended by Dr. Thedore Mins     Patient Active Problem List   Diagnosis Date Noted   Diabetes mellitus with proteinuria (HCC) 12/11/2023   Hypertension associated with diabetes (HCC) 12/11/2023   History of above knee amputation, right (HCC) 12/13/2022   Elevated troponin 11/21/2022   B12 deficiency 09/05/2022   Proliferative diabetic  retinopathy of both eyes without macular edema associated with type 2 diabetes mellitus (HCC) 06/05/2022   PAD (peripheral artery disease) (HCC) 04/12/2021   Secondary renal hyperparathyroidism (HCC) 03/19/2019   Diabetes (HCC) 07/03/2018   Vitamin D deficiency 04/11/2018   ED (erectile dysfunction) 04/10/2018   Lower urinary tract symptoms (LUTS) 02/09/2017   Hypertension 07/05/2015   Hyperglycemia due to type 2 diabetes mellitus (HCC) 07/05/2015   HLD (hyperlipidemia) 07/05/2015    Past Surgical History:  Procedure Laterality Date   AMPUTATION Right 04/16/2021   Procedure: AMPUTATION  RIGHT 5TH RAY, INCISION AND DRAINAGE;  Surgeon: Felecia Shelling, DPM;  Location: [facility];  Service: Podiatry;  Laterality: Right;   AMPUTATION Right 11/24/2022   Procedure: AMPUTATION ABOVE KNEE;  Surgeon: Renford Dills, MD;  Location: [facility];  Service: Vascular;  Laterality: Right;   APPLICATION OF WOUND VAC Right 04/21/2021   Procedure: APPLICATION OF WOUND VAC;  Surgeon: Felecia Shelling, DPM;  Location: [facility];  Service: Podiatry;  Laterality: Right;   BONE BIOPSY Left 06/27/2020   Procedure: BONE BIOPSY;  Surgeon: Rosetta Posner, DPM;  Location: [facility];  Service: Podiatry;  Laterality: Left;   COLONOSCOPY WITH PROPOFOL N/A 06/22/2022   Procedure: COLONOSCOPY WITH PROPOFOL;  Surgeon: Wyline Mood, MD;  Location: [facility];  Service: Gastroenterology;  Laterality: N/A;   I & D EXTREMITY Right 11/22/2022   Procedure: IRRIGATION AND  DEBRIDEMENT EXTREMITY, below knee amputation right leg;  Surgeon: Leafy Ro, MD;  Location: [facility];  Service: General;  Laterality: Right;   INCISION AND DRAINAGE Left 06/27/2020   Procedure: INCISION AND DRAINAGE;  Surgeon: Rosetta Posner, DPM;  Location: [facility];  Service: Podiatry;  Laterality: Left;   IRRIGATION AND DEBRIDEMENT FOOT Right 04/21/2021   Procedure: IRRIGATION AND DEBRIDEMENT FOOT;  Surgeon: Felecia Shelling, DPM;  Location: [facility];  Service:  Podiatry;  Laterality: Right;   LOWER EXTREMITY ANGIOGRAPHY Left 06/30/2020   Procedure: Lower Extremity Angiography;  Surgeon: Annice Needy, MD;  Location: [facility];  Service: Cardiovascular;  Laterality: Left;   LOWER EXTREMITY ANGIOGRAPHY Right 04/14/2021   Procedure: Lower Extremity Angiography;  Surgeon: Annice Needy, MD;  Location: [facility];  Service: Cardiovascular;  Laterality: Right;   NO PAST SURGERIES     WOUND DEBRIDEMENT Right 04/19/2021   Procedure: DEBRIDEMENT WOUND;  Surgeon: Felecia Shelling, DPM;  Location: [facility];  Service: Podiatry;  Laterality: Right;    Family History  Problem Relation Age of Onset   Heart disease Father    Heart attack Father    Healthy Mother    Prostate cancer Neg Hx    Kidney cancer Neg Hx    Bladder Cancer Neg Hx     Social History   Tobacco Use   Smoking status: Never   Smokeless tobacco: Never  Substance Use Topics   Alcohol use: Yes    Alcohol/week: 6.0 - 12.0 standard drinks of alcohol    Types: 6 - 12 Cans of beer per week    Comment: Occasional     Current Outpatient Medications:    ACCU-CHEK GUIDE TEST test strip, 1 EACH BY OTHER ROUTE IN THE MORNING AND AT BEDTIME. USE AS INSTRUCTED, Disp: 200 strip, Rfl: 0   Accu-Chek Softclix Lancets lancets, USE TO TEST BLOOD SUGAR UP TO FOUR TIMES DAILY AS DIRECTED, Disp: 200 each, Rfl: 1   Cholecalciferol (VITAMIN D3) 50 MCG (2000 UT) CAPS, Take 1 capsule (2,000 Units total) by mouth daily., Disp: 90 capsule, Rfl: 1   cyanocobalamin (VITAMIN B12) 1000 MCG tablet, Take 1 tablet (1,000 mcg total) by mouth daily., Disp: 90 tablet, Rfl: 1   empagliflozin (JARDIANCE) 25 MG TABS tablet, Take 1 tablet (25 mg total) by mouth daily., Disp: 90 tablet, Rfl: 1   Finerenone (KERENDIA) 10 MG TABS, Take 1 tablet by mouth daily at 12 noon., Disp: , Rfl:    glipiZIDE (GLUCOTROL XL) 2.5 MG 24 hr tablet, Take 1 tablet (2.5 mg total) by mouth daily with breakfast., Disp: 90 tablet, Rfl:  1   hydrALAZINE (APRESOLINE) 10 MG tablet, Take 1 tablet (10 mg total) by mouth 3 (three) times daily. If bp above 140/90, Disp: 270 tablet, Rfl: 1   nebivolol (BYSTOLIC) 10 MG tablet, Take 1 tablet (10 mg total) by mouth daily. In place of amlodipine, Disp: 90 tablet, Rfl: 0   Plecanatide (TRULANCE) 3 MG TABS, Take 1 tablet (3 mg total) by mouth daily at 12 noon., Disp: 90 tablet, Rfl: 0   rosuvastatin (CRESTOR) 40 MG tablet, TAKE 1 TABLET BY MOUTH EVERY DAY, Disp: 90 tablet, Rfl: 1   Semaglutide (RYBELSUS) 14 MG TABS, Take 1 tablet (14 mg total) by mouth daily., Disp: 90 tablet, Rfl: 1   valsartan-hydrochlorothiazide (DIOVAN-HCT) 320-25 MG tablet, Take 1 tablet by mouth daily., Disp: 90 tablet, Rfl: 1  No Known Allergies  I personally reviewed active  problem list, medication list, allergies, family history with the patient/caregiver today.   ROS  Ten systems reviewed and is negative except as mentioned in HPI    Objective Physical Exam Constitutional: Patient appears well-developed and well-nourished. Obese  No distress.  HEENT: head atraumatic, normocephalic, pupils equal and reactive to light, neck supple Cardiovascular: Normal rate, regular rhythm and normal heart sounds.  No murmur heard. No BLE edema. Pulmonary/Chest: Effort normal and breath sounds normal. No respiratory distress. Abdominal: Soft.  There is no tenderness. Psychiatric: Patient has a normal mood and affect. behavior is normal. Judgment and thought content normal.   Vitals:   03/07/24 1351 03/07/24 1428  BP: (!) 182/86 (!) 146/84  Pulse: 71   Resp: 16   SpO2: 100%   Weight: 195 lb 9.6 oz (88.7 kg)   Height: 5\' 11"  (1.803 m)     Body mass index is 27.28 kg/m.  Recent Results (from the past 2160 hours)  POCT glycosylated hemoglobin (Hb A1C)     Status: Abnormal   Collection Time: 03/07/24  1:55 PM  Result Value Ref Range   Hemoglobin A1C 6.5 (A) 4.0 - 5.6 %   HbA1c POC (<> result, manual entry)      HbA1c, POC (prediabetic range)     HbA1c, POC (controlled diabetic range)      Diabetic Foot Exam:     PHQ2/9:    03/07/2024    1:50 PM 01/01/2024   11:37 AM 09/17/2023    2:21 PM 06/04/2023    2:54 PM 02/26/2023    3:08 PM  Depression screen PHQ 2/9  Decreased Interest 0 0 0 0 0  Down, Depressed, Hopeless 0 0 0 0 0  PHQ - 2 Score 0 0 0 0 0  Altered sleeping 0 0 0 0 0  Tired, decreased energy 0 0 0 0 0  Change in appetite 0 0 0 0 0  Feeling bad or failure about yourself  0 0 0 0 0  Trouble concentrating 0 0 0 0 0  Moving slowly or fidgety/restless 0 0 0 0 0  Suicidal thoughts 0 0 0 0 0  PHQ-9 Score 0 0 0 0 0  Difficult doing work/chores Not difficult at all Not difficult at all       phq 9 is negative  Fall Risk:    01/01/2024   11:36 AM 12/11/2023    9:21 AM 09/17/2023    2:21 PM 06/04/2023    2:53 PM 02/26/2023    3:08 PM  Fall Risk   Falls in the past year? 1 0 0 0 0  Number falls in past yr: 0 0 0 0   Injury with Fall? 0 0 0 0   Risk for fall due to : Impaired balance/gait  Impaired balance/gait Impaired mobility;Impaired balance/gait Impaired mobility  Follow up Falls prevention discussed;Education provided;Falls evaluation completed  Falls prevention discussed Falls prevention discussed Falls prevention discussed;Education provided;Falls evaluation completed     Assessment & Plan  1. Dyslipidemia associated with type 2 diabetes mellitus (HCC) (Primary)  - POCT glycosylated hemoglobin (Hb A1C)  2. PAD (peripheral artery disease) (HCC)  - nebivolol (BYSTOLIC) 10 MG tablet; Take 1 tablet (10 mg total) by mouth daily. In place of amlodipine  Dispense: 90 tablet; Refill: 0  3. S/P above knee amputation, right (HCC)  Unchanged   4. Secondary renal hyperparathyroidism (HCC)  Continue follow up with nephrologist   5. Hypertension associated with diabetes (HCC)  - nebivolol (BYSTOLIC) 10  MG tablet; Take 1 tablet (10 mg total) by mouth daily. In place of  amlodipine  Dispense: 90 tablet; Refill: 0  6. Type 2 diabetes mellitus with microalbuminuria, without long-term current use of insulin (HCC)  - Urine Microalbumin w/creat. ratio  7. Vitamin D deficiency  Continue supplementation  8. Uncontrolled hypertension  - nebivolol (BYSTOLIC) 10 MG tablet; Take 1 tablet (10 mg total) by mouth daily. In place of amlodipine  Dispense: 90 tablet; Refill: 0

## 2024-03-08 ENCOUNTER — Other Ambulatory Visit: Payer: Self-pay | Admitting: Family Medicine

## 2024-03-08 DIAGNOSIS — E1169 Type 2 diabetes mellitus with other specified complication: Secondary | ICD-10-CM

## 2024-03-08 LAB — MICROALBUMIN / CREATININE URINE RATIO
Creatinine, Urine: 102 mg/dL (ref 20–320)
Microalb Creat Ratio: 2821 mg/g{creat} — ABNORMAL HIGH (ref <30)
Microalb, Ur: 287.7 mg/dL

## 2024-03-10 ENCOUNTER — Encounter: Payer: Self-pay | Admitting: Family Medicine

## 2024-03-10 ENCOUNTER — Telehealth: Payer: Self-pay

## 2024-03-10 DIAGNOSIS — Z89611 Acquired absence of right leg above knee: Secondary | ICD-10-CM

## 2024-03-10 NOTE — Telephone Encounter (Signed)
 Copied from CRM [id number]. Topic: General - Other >> Mar 10, 2024 10:25 AM Abundio Miu S wrote: Reason for CRM: Patient is requesting the following medical supplies and would like to know if they are covered by insurance:  1. Above the knee [place]  [place] 2. [employer] that prescription be sent to [facility] [address] [place], [place], [place] [phone]  Patient callback# [phone]

## 2024-03-10 NOTE — Telephone Encounter (Signed)
 Ok to order

## 2024-03-10 NOTE — Telephone Encounter (Signed)
 DME ordered will fax to place requested and will contact patient to let him know I do not know the coverage from insurance but the medical equipment supply store will let him know the details.

## 2024-04-29 ENCOUNTER — Encounter (INDEPENDENT_AMBULATORY_CARE_PROVIDER_SITE_OTHER): Payer: Self-pay

## 2024-04-29 NOTE — Telephone Encounter (Unsigned)
 Copied from CRM [id number]. Topic: Referral - Prior Authorization Question >> Apr 28, 2024  4:51 PM DeAngela L wrote: Reason for CRM: Patient insurance company to him to contact the office for a prior auth for above the knee [employer] socket  Pt number [phone] (M)

## 2024-05-12 ENCOUNTER — Other Ambulatory Visit (HOSPITAL_COMMUNITY): Payer: Self-pay

## 2024-05-12 ENCOUNTER — Telehealth: Payer: Self-pay | Admitting: Pharmacy Technician

## 2024-05-12 NOTE — Telephone Encounter (Signed)
 Pharmacy Patient Advocate Encounter  Received notification from [employer] that Prior Authorization for Rybelsus  14MG  tablets has been APPROVED from 05/12/24 to 12/10/24. Unable to obtain price due to refill too soon rejection, last fill date 05/12/24 next available fill date is after 05/29/24   PA #/Case ID/Reference #: [id number]

## 2024-05-12 NOTE — Telephone Encounter (Signed)
 Pharmacy Patient Advocate Encounter   Received notification from CoverMyMeds that prior authorization for Rybelsus  14MG  tablets is due for renewal.   Insurance verification completed.   The patient is insured through [facility].  Action: PA required; PA submitted to above mentioned insurance via CoverMyMeds Key/confirmation #/EOC [id number] Status is pending

## 2024-06-03 ENCOUNTER — Other Ambulatory Visit: Payer: Self-pay | Admitting: Family Medicine

## 2024-06-03 DIAGNOSIS — I739 Peripheral vascular disease, unspecified: Secondary | ICD-10-CM

## 2024-06-03 DIAGNOSIS — I10 Essential (primary) hypertension: Secondary | ICD-10-CM

## 2024-06-03 DIAGNOSIS — I152 Hypertension secondary to endocrine disorders: Secondary | ICD-10-CM

## 2024-06-05 ENCOUNTER — Other Ambulatory Visit: Payer: Self-pay | Admitting: Family Medicine

## 2024-06-05 DIAGNOSIS — E1169 Type 2 diabetes mellitus with other specified complication: Secondary | ICD-10-CM

## 2024-06-06 ENCOUNTER — Other Ambulatory Visit: Payer: Self-pay | Admitting: Family Medicine

## 2024-06-06 DIAGNOSIS — E1169 Type 2 diabetes mellitus with other specified complication: Secondary | ICD-10-CM

## 2024-06-13 ENCOUNTER — Other Ambulatory Visit: Payer: Self-pay | Admitting: Family Medicine

## 2024-06-13 DIAGNOSIS — E1169 Type 2 diabetes mellitus with other specified complication: Secondary | ICD-10-CM

## 2024-06-28 ENCOUNTER — Other Ambulatory Visit: Payer: Self-pay | Admitting: Family Medicine

## 2024-06-28 DIAGNOSIS — I152 Hypertension secondary to endocrine disorders: Secondary | ICD-10-CM

## 2024-06-28 DIAGNOSIS — I10 Essential (primary) hypertension: Secondary | ICD-10-CM

## 2024-06-28 DIAGNOSIS — I739 Peripheral vascular disease, unspecified: Secondary | ICD-10-CM

## 2024-06-28 DIAGNOSIS — E1169 Type 2 diabetes mellitus with other specified complication: Secondary | ICD-10-CM

## 2024-07-02 ENCOUNTER — Other Ambulatory Visit: Payer: Self-pay | Admitting: Family Medicine

## 2024-07-02 DIAGNOSIS — E1169 Type 2 diabetes mellitus with other specified complication: Secondary | ICD-10-CM

## 2024-07-07 ENCOUNTER — Ambulatory Visit: Admitting: Family Medicine

## 2024-07-11 ENCOUNTER — Other Ambulatory Visit: Payer: Self-pay | Admitting: Family Medicine

## 2024-07-11 DIAGNOSIS — I152 Hypertension secondary to endocrine disorders: Secondary | ICD-10-CM

## 2024-07-14 ENCOUNTER — Other Ambulatory Visit: Payer: Self-pay | Admitting: Family Medicine

## 2024-07-14 DIAGNOSIS — E1169 Type 2 diabetes mellitus with other specified complication: Secondary | ICD-10-CM

## 2024-07-19 ENCOUNTER — Other Ambulatory Visit: Payer: Self-pay | Admitting: Family Medicine

## 2024-07-19 DIAGNOSIS — E1159 Type 2 diabetes mellitus with other circulatory complications: Secondary | ICD-10-CM

## 2024-07-19 DIAGNOSIS — I739 Peripheral vascular disease, unspecified: Secondary | ICD-10-CM

## 2024-07-19 DIAGNOSIS — E1169 Type 2 diabetes mellitus with other specified complication: Secondary | ICD-10-CM

## 2024-07-22 ENCOUNTER — Other Ambulatory Visit: Payer: Self-pay | Admitting: Family Medicine

## 2024-07-22 DIAGNOSIS — E1169 Type 2 diabetes mellitus with other specified complication: Secondary | ICD-10-CM

## 2024-07-28 ENCOUNTER — Other Ambulatory Visit: Payer: Self-pay | Admitting: Family Medicine

## 2024-07-28 DIAGNOSIS — E1169 Type 2 diabetes mellitus with other specified complication: Secondary | ICD-10-CM

## 2024-07-30 ENCOUNTER — Other Ambulatory Visit: Payer: Self-pay | Admitting: Family Medicine

## 2024-07-30 DIAGNOSIS — E1169 Type 2 diabetes mellitus with other specified complication: Secondary | ICD-10-CM

## 2024-08-12 ENCOUNTER — Other Ambulatory Visit: Payer: Self-pay | Admitting: Family Medicine

## 2024-08-12 ENCOUNTER — Ambulatory Visit: Admitting: Family Medicine

## 2024-08-12 ENCOUNTER — Encounter: Payer: Self-pay | Admitting: Family Medicine

## 2024-08-12 VITALS — BP 160/80 | HR 67 | Resp 16 | Ht 71.0 in | Wt 196.3 lb

## 2024-08-12 DIAGNOSIS — N2581 Secondary hyperparathyroidism of renal origin: Secondary | ICD-10-CM

## 2024-08-12 DIAGNOSIS — I739 Peripheral vascular disease, unspecified: Secondary | ICD-10-CM | POA: Diagnosis not present

## 2024-08-12 DIAGNOSIS — E1169 Type 2 diabetes mellitus with other specified complication: Secondary | ICD-10-CM | POA: Diagnosis not present

## 2024-08-12 DIAGNOSIS — E1159 Type 2 diabetes mellitus with other circulatory complications: Secondary | ICD-10-CM

## 2024-08-12 DIAGNOSIS — R809 Proteinuria, unspecified: Secondary | ICD-10-CM

## 2024-08-12 DIAGNOSIS — E1129 Type 2 diabetes mellitus with other diabetic kidney complication: Secondary | ICD-10-CM

## 2024-08-12 DIAGNOSIS — Z89611 Acquired absence of right leg above knee: Secondary | ICD-10-CM

## 2024-08-12 DIAGNOSIS — I10 Essential (primary) hypertension: Secondary | ICD-10-CM

## 2024-08-12 DIAGNOSIS — E785 Hyperlipidemia, unspecified: Secondary | ICD-10-CM

## 2024-08-12 DIAGNOSIS — I152 Hypertension secondary to endocrine disorders: Secondary | ICD-10-CM

## 2024-08-12 LAB — POCT GLYCOSYLATED HEMOGLOBIN (HGB A1C): Hemoglobin A1C: 5.9 % — AB (ref 4.0–5.6)

## 2024-08-12 MED ORDER — HYDRALAZINE HCL 10 MG PO TABS: 10.0000 mg | ORAL_TABLET | Freq: Three times a day (TID) | ORAL | 0 refills | Status: DC

## 2024-08-12 MED ORDER — ROSUVASTATIN CALCIUM 40 MG PO TABS: 40.0000 mg | ORAL_TABLET | Freq: Every day | ORAL | 1 refills | Status: DC

## 2024-08-12 MED ORDER — EMPAGLIFLOZIN 25 MG PO TABS
25.0000 mg | ORAL_TABLET | Freq: Every day | ORAL | 1 refills | Status: DC
Start: 2024-08-12 — End: 2024-08-25

## 2024-08-12 MED ORDER — RYBELSUS 14 MG PO TABS
14.0000 mg | ORAL_TABLET | Freq: Every day | ORAL | 0 refills | Status: DC
Start: 2024-08-12 — End: 2024-08-25

## 2024-08-12 MED ORDER — VALSARTAN-HYDROCHLOROTHIAZIDE 320-25 MG PO TABS
1.0000 | ORAL_TABLET | Freq: Every day | ORAL | 1 refills | Status: DC
Start: 2024-08-12 — End: 2024-08-25

## 2024-08-12 MED ORDER — GLIPIZIDE 2.5 MG PO TABS: 1.2500 | ORAL_TABLET | Freq: Two times a day (BID) | ORAL | 1 refills | Status: DC

## 2024-08-12 NOTE — Progress Notes (Signed)
 Name: [name]   MRN: [id number]    DOB: 1966-06-30   Date:08/12/2024       Progress Note  Subjective  Chief Complaint  Chief Complaint  Patient presents with   Medical Management of Chronic Issues   HPI   Dyslipidemia: taking Crestor  40 mg  daily, Last LDL was above goal again at 95. Goal for him is below 70. He does not want to add medication for cholesterol    History of gas gangrene : s/p right AKA done 11/22/2022.  He has a prosthesis now and is doing well    Uncontrolled HTN/white coat : currently taking valsartan  hctz 320/25 mg daily and norvasc  10 mg,he has been taking hydralazine  in am only, he states bp is normal at home but gets nervous when he comes in. He states does not want to add anymore medications. Very skeptical about the health system   CKI stage I , DM with proteinuria  with secondary hyperparathyroidism : under the care of nephrologist , recently started on Kerendia, his last pth improved. He is on Jardiance  and [place].   DMII: A1C is down to 5.9 %, discussed risk of hypoglycemia. We will continue  Jardiance  and Rybelsus  by go down to Glipizide  1.25 mg before largest meal of the day and may take bid if needed.  He denies polyphagia, polydipsia or polyuria. He has associated PAD, diabetic retinopathy, dyslipidemia and microalbuminuria. He is on statin therapy, ARB and aspirin  daily . He takes ARB and also SLG-2 agonist for macroalbuminuria   B12 and vitamin D  deficiency : continue supplementation as recommended by neprhologist   Patient Active Problem List   Diagnosis Date Noted   Diabetes mellitus with proteinuria (HCC) 12/11/2023   Hypertension associated with diabetes (HCC) 12/11/2023   History of above knee amputation, right (HCC) 12/13/2022   Elevated troponin 11/21/2022   B12 deficiency 09/05/2022   Proliferative diabetic retinopathy of both eyes without macular edema associated with type 2 diabetes mellitus (HCC) 06/05/2022   PAD (peripheral artery  disease) (HCC) 04/12/2021   Secondary renal hyperparathyroidism (HCC) 03/19/2019   Diabetes (HCC) 07/03/2018   Vitamin D  deficiency 04/11/2018   ED (erectile dysfunction) 04/10/2018   Lower urinary tract symptoms (LUTS) 02/09/2017   Hypertension 07/05/2015   Hyperglycemia due to type 2 diabetes mellitus (HCC) 07/05/2015   HLD (hyperlipidemia) 07/05/2015    Past Surgical History:  Procedure Laterality Date   AMPUTATION Right 04/16/2021   Procedure: AMPUTATION  RIGHT 5TH RAY, INCISION AND DRAINAGE;  Surgeon: Janit Thresa HERO, DPM;  Location: [facility];  Service: Podiatry;  Laterality: Right;   AMPUTATION Right 11/24/2022   Procedure: AMPUTATION ABOVE KNEE;  Surgeon: Jama Cordella MATSU, MD;  Location: [facility];  Service: Vascular;  Laterality: Right;   APPLICATION OF WOUND VAC Right 04/21/2021   Procedure: APPLICATION OF WOUND VAC;  Surgeon: Janit Thresa HERO, DPM;  Location: [facility];  Service: Podiatry;  Laterality: Right;   BONE BIOPSY Left 06/27/2020   Procedure: BONE BIOPSY;  Surgeon: Lennie Barter, DPM;  Location: [facility];  Service: Podiatry;  Laterality: Left;   COLONOSCOPY WITH PROPOFOL  N/A 06/22/2022   Procedure: COLONOSCOPY WITH PROPOFOL ;  Surgeon: Therisa Bi, MD;  Location: [facility];  Service: Gastroenterology;  Laterality: N/A;   I & D EXTREMITY Right 11/22/2022   Procedure: IRRIGATION AND DEBRIDEMENT EXTREMITY, below knee amputation right leg;  Surgeon: Jordis Laneta FALCON, MD;  Location: [facility];  Service: General;  Laterality: Right;   INCISION AND DRAINAGE Left  06/27/2020   Procedure: INCISION AND DRAINAGE;  Surgeon: Lennie Barter, DPM;  Location: [facility];  Service: Podiatry;  Laterality: Left;   IRRIGATION AND DEBRIDEMENT FOOT Right 04/21/2021   Procedure: IRRIGATION AND DEBRIDEMENT FOOT;  Surgeon: Janit Thresa HERO, DPM;  Location: [facility];  Service: Podiatry;  Laterality: Right;   LOWER EXTREMITY ANGIOGRAPHY Left 06/30/2020   Procedure: Lower Extremity Angiography;  Surgeon: Marea Selinda RAMAN, MD;  Location: [facility];  Service: Cardiovascular;  Laterality: Left;   LOWER EXTREMITY ANGIOGRAPHY Right 04/14/2021   Procedure: Lower Extremity Angiography;  Surgeon: Marea Selinda RAMAN, MD;  Location: [facility];  Service: Cardiovascular;  Laterality: Right;   NO PAST SURGERIES     WOUND DEBRIDEMENT Right 04/19/2021   Procedure: DEBRIDEMENT WOUND;  Surgeon: Janit Thresa HERO, DPM;  Location: [facility];  Service: Podiatry;  Laterality: Right;    Family History  Problem Relation Age of Onset   Heart disease Father    Heart attack Father    Healthy Mother    Prostate cancer Neg Hx    Kidney cancer Neg Hx    Bladder Cancer Neg Hx     Social History   Tobacco Use   Smoking status: Never   Smokeless tobacco: Never  Substance Use Topics   Alcohol use: Yes    Alcohol/week: 6.0 - 12.0 standard drinks of alcohol    Types: 6 - 12 Cans of beer per week    Comment: Occasional     Current Outpatient Medications:    ACCU-CHEK GUIDE TEST test strip, 1 EACH BY OTHER ROUTE IN THE MORNING AND AT BEDTIME. USE AS INSTRUCTED, Disp: 200 strip, Rfl: 0   Accu-Chek Softclix Lancets lancets, USE TO TEST BLOOD SUGAR UP TO FOUR TIMES DAILY AS DIRECTED, Disp: 200 each, Rfl: 1   amLODipine  (NORVASC ) 10 MG tablet, Take 10 mg by mouth daily., Disp: , Rfl:    Cholecalciferol (VITAMIN D3) 50 MCG (2000 UT) CAPS, Take 1 capsule (2,000 Units total) by mouth daily., Disp: 90 capsule, Rfl: 1   cyanocobalamin  (VITAMIN B12) 1000 MCG tablet, Take 1 tablet (1,000 mcg total) by mouth daily., Disp: 90 tablet, Rfl: 1   Finerenone (KERENDIA) 10 MG TABS, Take 1 tablet by mouth daily at 12 noon., Disp: , Rfl:    glipiZIDE  2.5 MG TABS, Take 1.25 tablets by mouth 2 (two) times daily before lunch and supper., Disp: 90 tablet, Rfl: 1   Plecanatide  (TRULANCE ) 3 MG TABS, Take 1 tablet (3 mg total) by mouth daily at 12 noon., Disp: 90 tablet, Rfl: 0   empagliflozin  (JARDIANCE ) 25 MG TABS tablet, Take 1 tablet  (25 mg total) by mouth daily., Disp: 90 tablet, Rfl: 1   hydrALAZINE  (APRESOLINE ) 10 MG tablet, Take 1 tablet (10 mg total) by mouth 3 (three) times daily. If bp above 140/90, Disp: 90 tablet, Rfl: 0   rosuvastatin  (CRESTOR ) 40 MG tablet, Take 1 tablet (40 mg total) by mouth daily., Disp: 90 tablet, Rfl: 1   Semaglutide  (RYBELSUS ) 14 MG TABS, Take 1 tablet (14 mg total) by mouth daily., Disp: 90 tablet, Rfl: 0   valsartan -hydrochlorothiazide  (DIOVAN -HCT) 320-25 MG tablet, Take 1 tablet by mouth daily., Disp: 90 tablet, Rfl: 1  No Known Allergies  I personally reviewed active problem list, medication list, allergies, family history with the patient/caregiver today.   ROS  Ten systems reviewed and is negative except as mentioned in HPI    Objective Physical Exam   Vitals:  08/12/24 1440 08/12/24 1536  BP: (!) 188/92 (!) 160/80  Pulse: 67   Resp: 16   SpO2: 98%   Weight: 196 lb 4.8 oz (89 kg)   Height: 5' 11 (1.803 m)     Body mass index is 27.38 kg/m.  Recent Results (from the past 2160 hours)  POCT glycosylated hemoglobin (Hb A1C)     Status: Abnormal   Collection Time: 08/12/24  2:45 PM  Result Value Ref Range   Hemoglobin A1C 5.9 (A) 4.0 - 5.6 %   HbA1c POC (<> result, manual entry)     HbA1c, POC (prediabetic range)     HbA1c, POC (controlled diabetic range)      Diabetic Foot Exam: refused today    PHQ2/9:    08/12/2024    2:33 PM 03/07/2024    1:50 PM 01/01/2024   11:37 AM 09/17/2023    2:21 PM 06/04/2023    2:54 PM  Depression screen PHQ 2/9  Decreased Interest 0 0 0 0 0  Down, Depressed, Hopeless 0 0 0 0 0  PHQ - 2 Score 0 0 0 0 0  Altered sleeping  0 0 0 0  Tired, decreased energy  0 0 0 0  Change in appetite  0 0 0 0  Feeling bad or failure about yourself   0 0 0 0  Trouble concentrating  0 0 0 0  Moving slowly or fidgety/restless  0 0 0 0  Suicidal thoughts  0 0 0 0  PHQ-9 Score  0 0 0 0  Difficult doing work/chores  Not difficult at all Not  difficult at all      phq 9 is negative  Fall Risk:    08/12/2024    2:33 PM 01/01/2024   11:36 AM 12/11/2023    9:21 AM 09/17/2023    2:21 PM 06/04/2023    2:53 PM  Fall Risk   Falls in the past year? 0 1 0 0 0  Number falls in past yr: 0 0 0 0 0  Injury with Fall? 0 0 0 0 0  Risk for fall due to : No Fall Risks Impaired balance/gait  Impaired balance/gait Impaired mobility;Impaired balance/gait  Follow up Falls evaluation completed Falls prevention discussed;Education provided;Falls evaluation completed  Falls prevention discussed Falls prevention discussed     Assessment & Plan  1. Dyslipidemia associated with type 2 diabetes mellitus (HCC) (Primary)  - POCT glycosylated hemoglobin (Hb A1C) - Comprehensive metabolic panel with GFR - HM Diabetes Foot Exam - Lipid panel - rosuvastatin  (CRESTOR ) 40 MG tablet; Take 1 tablet (40 mg total) by mouth daily.  Dispense: 90 tablet; Refill: 1 - Semaglutide  (RYBELSUS ) 14 MG TABS; Take 1 tablet (14 mg total) by mouth daily.  Dispense: 90 tablet; Refill: 0 - glipiZIDE  2.5 MG TABS; Take 1.25 tablets by mouth 2 (two) times daily before lunch and supper.  Dispense: 90 tablet; Refill: 1 - empagliflozin  (JARDIANCE ) 25 MG TABS tablet; Take 1 tablet (25 mg total) by mouth daily.  Dispense: 90 tablet; Refill: 1 - Hepatic function panel  2. PAD (peripheral artery disease) (HCC)  - rosuvastatin  (CRESTOR ) 40 MG tablet; Take 1 tablet (40 mg total) by mouth daily.  Dispense: 90 tablet; Refill: 1  3. Hypertension associated with diabetes (HCC)  - valsartan -hydrochlorothiazide  (DIOVAN -HCT) 320-25 MG tablet; Take 1 tablet by mouth daily.  Dispense: 90 tablet; Refill: 1 - hydrALAZINE  (APRESOLINE ) 10 MG tablet; Take 1 tablet (10 mg total) by mouth 3 (three) times daily.  If bp above 140/90  Dispense: 90 tablet; Refill: 0  4. Secondary renal hyperparathyroidism (HCC)  Under the care of nephrologist   5. S/P above knee amputation, right (HCC)  Using  prosthesis   6. Diabetes mellitus with proteinuria (HCC)  Monitored by nephrologist   9. White coat syndrome with diagnosis of hypertension  He states bp at goal at home but did not bring a log of bp readings   BP improved with rest but still above goal when he left   10. Uncontrolled hypertension  Not interested in adjusting medications

## 2024-08-13 ENCOUNTER — Ambulatory Visit: Payer: Self-pay | Admitting: Family Medicine

## 2024-08-13 LAB — COMPREHENSIVE METABOLIC PANEL WITH GFR
AG Ratio: 1.6 (calc) (ref 1.0–2.5)
ALT: 33 U/L (ref 9–46)
AST: 20 U/L (ref 10–35)
Albumin: 4.2 g/dL (ref 3.6–5.1)
Alkaline phosphatase (APISO): 83 U/L (ref 35–144)
BUN: 24 mg/dL (ref 7–25)
CO2: 25 mmol/L (ref 20–32)
Calcium: 10.3 mg/dL (ref 8.6–10.3)
Chloride: 103 mmol/L (ref 98–110)
Creat: 1.06 mg/dL (ref 0.70–1.30)
Globulin: 2.7 g/dL (ref 1.9–3.7)
Glucose, Bld: 112 mg/dL — ABNORMAL HIGH (ref 65–99)
Potassium: 4.8 mmol/L (ref 3.5–5.3)
Sodium: 139 mmol/L (ref 135–146)
Total Bilirubin: 1.8 mg/dL — ABNORMAL HIGH (ref 0.2–1.2)
Total Protein: 6.9 g/dL (ref 6.1–8.1)
eGFR: 82 mL/min/1.73m2 (ref >=60)

## 2024-08-13 LAB — LIPID PANEL
Cholesterol: 235 mg/dL — ABNORMAL HIGH (ref <200)
HDL: 76 mg/dL (ref >=40)
LDL Cholesterol (Calc): 140 mg/dL — ABNORMAL HIGH
Non-HDL Cholesterol (Calc): 159 mg/dL — ABNORMAL HIGH (ref <130)
Total CHOL/HDL Ratio: 3.1 (calc) (ref <5.0)
Triglycerides: 87 mg/dL (ref <150)

## 2024-08-13 LAB — HEPATIC FUNCTION PANEL
AG Ratio: 1.6 (calc) (ref 1.0–2.5)
ALT: 33 U/L (ref 9–46)
AST: 20 U/L (ref 10–35)
Albumin: 4.2 g/dL (ref 3.6–5.1)
Alkaline phosphatase (APISO): 83 U/L (ref 35–144)
Bilirubin, Direct: 0.3 mg/dL — ABNORMAL HIGH (ref 0.0–0.2)
Globulin: 2.7 g/dL (ref 1.9–3.7)
Indirect Bilirubin: 1.5 mg/dL — ABNORMAL HIGH (ref 0.2–1.2)
Total Bilirubin: 1.8 mg/dL — ABNORMAL HIGH (ref 0.2–1.2)
Total Protein: 6.9 g/dL (ref 6.1–8.1)

## 2024-08-16 ENCOUNTER — Other Ambulatory Visit: Payer: Self-pay | Admitting: Family Medicine

## 2024-08-16 DIAGNOSIS — I739 Peripheral vascular disease, unspecified: Secondary | ICD-10-CM

## 2024-08-16 DIAGNOSIS — E1159 Type 2 diabetes mellitus with other circulatory complications: Secondary | ICD-10-CM

## 2024-08-16 DIAGNOSIS — E1169 Type 2 diabetes mellitus with other specified complication: Secondary | ICD-10-CM

## 2024-08-17 ENCOUNTER — Other Ambulatory Visit: Payer: Self-pay | Admitting: Family Medicine

## 2024-08-17 DIAGNOSIS — E1169 Type 2 diabetes mellitus with other specified complication: Secondary | ICD-10-CM

## 2024-08-19 ENCOUNTER — Other Ambulatory Visit: Payer: Self-pay | Admitting: Family Medicine

## 2024-08-19 DIAGNOSIS — E1169 Type 2 diabetes mellitus with other specified complication: Secondary | ICD-10-CM

## 2024-08-23 ENCOUNTER — Other Ambulatory Visit: Payer: Self-pay | Admitting: Family Medicine

## 2024-08-23 DIAGNOSIS — E1169 Type 2 diabetes mellitus with other specified complication: Secondary | ICD-10-CM

## 2024-08-25 ENCOUNTER — Telehealth: Payer: Self-pay

## 2024-08-25 DIAGNOSIS — I739 Peripheral vascular disease, unspecified: Secondary | ICD-10-CM

## 2024-08-25 DIAGNOSIS — E1169 Type 2 diabetes mellitus with other specified complication: Secondary | ICD-10-CM

## 2024-08-25 DIAGNOSIS — E1159 Type 2 diabetes mellitus with other circulatory complications: Secondary | ICD-10-CM

## 2024-08-25 MED ORDER — GLIPIZIDE 2.5 MG PO TABS: 1.2500 | ORAL_TABLET | Freq: Two times a day (BID) | ORAL | 1 refills | Status: DC

## 2024-08-25 MED ORDER — RYBELSUS 14 MG PO TABS: 14.0000 mg | ORAL_TABLET | Freq: Every day | ORAL | 0 refills | Status: AC

## 2024-08-25 MED ORDER — VALSARTAN-HYDROCHLOROTHIAZIDE 320-25 MG PO TABS: 1.0000 | ORAL_TABLET | Freq: Every day | ORAL | 1 refills | Status: AC

## 2024-08-25 MED ORDER — EMPAGLIFLOZIN 25 MG PO TABS: 25.0000 mg | ORAL_TABLET | Freq: Every day | ORAL | 1 refills | Status: AC

## 2024-08-25 MED ORDER — ROSUVASTATIN CALCIUM 40 MG PO TABS: 40.0000 mg | ORAL_TABLET | Freq: Every day | ORAL | 1 refills | Status: AC

## 2024-08-25 MED ORDER — HYDRALAZINE HCL 10 MG PO TABS: 10.0000 mg | ORAL_TABLET | Freq: Three times a day (TID) | ORAL | 0 refills | Status: DC

## 2024-08-25 NOTE — Telephone Encounter (Signed)
 Copied from CRM [id number]. Topic: Clinical - Medication Question >> Aug 25, 2024 11:45 AM Myrick T wrote: Reason for CRM: patient called to see what was going on with his script for glipiZIDE  (GLUCOTROL  XL) 2.5 MG 24 hr tablet [Pharmacy Med Name: GLIPIZIDE  ER 2.5 MG TABLET]. Patient said he was told by provider she was going to change his dosage to a lower dose but now his med is being denied. Please f/u with patient

## 2024-08-25 NOTE — Telephone Encounter (Signed)
 Called pt told him rx's were filled on day of appt. He states he has not heard from pharmacy and I will contact pharmacy.  Spoke to pharmacy they did not received them will re send what was sent on 08/12/24.

## 2024-08-26 ENCOUNTER — Telehealth: Payer: Self-pay

## 2024-08-26 NOTE — Telephone Encounter (Signed)
 Copied from CRM [id number]. Topic: Clinical - Prescription Issue >> Aug 26, 2024 11:36 AM Rea ORN wrote: Reason for CRM: Pt called to advise he does not want to take glipizide  BID but would like to take it 2.5 mg once daily. Pt stated the pharmacy said there is a price differences to dispense the lower dose. Please call back.

## 2024-08-26 NOTE — Telephone Encounter (Signed)
 Called and lvm

## 2024-08-28 ENCOUNTER — Telehealth: Payer: Self-pay

## 2024-08-28 ENCOUNTER — Other Ambulatory Visit: Payer: Self-pay | Admitting: Family Medicine

## 2024-08-28 DIAGNOSIS — E1169 Type 2 diabetes mellitus with other specified complication: Secondary | ICD-10-CM

## 2024-08-28 NOTE — Telephone Encounter (Signed)
 Copied from CRM [id number]. Topic: Clinical - Prescription Issue >> Aug 28, 2024  3:33 PM Charlet HERO wrote: Reason for CRM: Patient is calling insurance  will not pay for the 1.5 mg dosage bc of the amount of pills that he will need, but they will pay for the glipiZIDE  (GLUCOTROL  XL) 2.5 MG 24 hr tablet the patient is requesting to stay on the glipiZIDE  (GLUCOTROL  XL) 2.5 MG 24 hr tablet.

## 2024-08-29 ENCOUNTER — Other Ambulatory Visit: Payer: Self-pay | Admitting: Family Medicine

## 2024-08-29 ENCOUNTER — Telehealth: Payer: Self-pay

## 2024-08-29 DIAGNOSIS — E785 Hyperlipidemia, unspecified: Secondary | ICD-10-CM

## 2024-08-29 MED ORDER — GLIPIZIDE ER 2.5 MG PO TB24: 2.5000 mg | ORAL_TABLET | Freq: Every day | ORAL | 0 refills | Status: DC

## 2024-08-29 MED ORDER — NATEGLINIDE 60 MG PO TABS: 60.0000 mg | ORAL_TABLET | Freq: Two times a day (BID) | ORAL | 1 refills | Status: DC

## 2024-08-29 NOTE — Telephone Encounter (Signed)
 Per telephone conversation between PCP and patient, pt will continue to do Glipizide  PRN. Pt declined new medication prescribed by PCP and wants to stay on Glipizide  PRN.

## 2024-08-29 NOTE — Telephone Encounter (Signed)
 Copied from CRM #[id number]. Topic: Clinical - Prescription Issue >> Aug 29, 2024  3:28 PM Leonette SQUIBB wrote: Reason for CRM: pt called saying insurance told the pharmacy they would not pay for his lower dose of the Glipizide .   They will pay for the 2.5 but not the 1.25.

## 2024-09-04 ENCOUNTER — Telehealth: Payer: Self-pay

## 2024-09-04 DIAGNOSIS — Z89611 Acquired absence of right leg above knee: Secondary | ICD-10-CM

## 2024-09-04 NOTE — Addendum Note (Signed)
 Addended by: RENTERIA-GARCIA, Islam Eichinger on: 09/04/2024 01:31 PM   Modules accepted: Orders

## 2024-09-04 NOTE — Telephone Encounter (Signed)
 Order placed

## 2024-09-04 NOTE — Telephone Encounter (Signed)
 Copied from CRM [id number]. Topic: Clinical - Order For Equipment >> Sep 04, 2024  8:44 AM Charlet HERO wrote: Reason for CRM: Patient is calling to have new above the knee prosthetic socket sent to [facility] on [address]

## 2024-09-17 ENCOUNTER — Telehealth: Payer: Self-pay

## 2024-09-17 NOTE — Telephone Encounter (Signed)
 Please resend order. [facility] is telling pt that they never received it. Put Attn: Elspeth Milian, cpo

## 2024-09-17 NOTE — Telephone Encounter (Signed)
 Copied from CRM [id number]. Topic: Clinical - Order For Equipment >> Sep 04, 2024  8:44 AM Charlet HERO wrote: Reason for CRM: Patient is calling to have new above the knee prosthetic socket sent to [facility] on [address] >> Sep 17, 2024  8:27 AM Donna BRAVO wrote: Patient calling asking for update on prescription for the new above the knee prosthetic socket sent to [facility] on [address]  Patient stated a nurse would call back, he has not received a call from anyone.  Prosthetics Elspeth Milian [facility] phone [phone] who makes the prosthetic socket.  Note: Tressa Ogles, CMA    09/04/24  1:41 PM Note Order placed    >> Sep 12, 2024  8:56 AM Larissa S wrote: Patient calling to followup on this prescription. Patient is requesting a callback

## 2024-09-17 NOTE — Telephone Encounter (Signed)
 Pt notified already sent

## 2024-09-17 NOTE — Telephone Encounter (Signed)
 Would he need to get that through ortho?

## 2024-09-21 ENCOUNTER — Other Ambulatory Visit: Payer: Self-pay | Admitting: Family Medicine

## 2024-09-21 DIAGNOSIS — I152 Hypertension secondary to endocrine disorders: Secondary | ICD-10-CM

## 2024-09-22 ENCOUNTER — Other Ambulatory Visit: Payer: Self-pay | Admitting: Family Medicine

## 2024-09-22 MED ORDER — GLIPIZIDE ER 2.5 MG PO TB24: 2.5000 mg | ORAL_TABLET | Freq: Every day | ORAL | 0 refills | Status: AC

## 2024-09-22 NOTE — Telephone Encounter (Unsigned)
 Copied from CRM [id number]. Topic: Clinical - Medication Question >> Sep 22, 2024  1:20 PM [name] wrote: Reason for CRM: pt wanted to leave a message for nurse to call about glipiZIDE  2.5 MG TAKE 1 TABLET (2.5 MG TOTAL) BY MOUTH DAILY WITH BREAKFAST. AS NEEDED ONLY. checked sugar was 184 this morning.  Kidney dr advised hims to get his a1c down to a 5 and not understanding why provider wnats to take him off medication.

## 2024-09-22 NOTE — Addendum Note (Signed)
 Addended by: GLENARD MIRE F on: 09/22/2024 03:03 PM   Modules accepted: Orders

## 2024-09-29 LAB — OPHTHALMOLOGY REPORT-SCANNED

## 2024-10-19 ENCOUNTER — Other Ambulatory Visit: Payer: Self-pay | Admitting: Family Medicine

## 2024-10-19 DIAGNOSIS — I152 Hypertension secondary to endocrine disorders: Secondary | ICD-10-CM

## 2024-10-20 ENCOUNTER — Ambulatory Visit (INDEPENDENT_AMBULATORY_CARE_PROVIDER_SITE_OTHER): Payer: 59 | Admitting: Vascular Surgery

## 2024-10-20 ENCOUNTER — Encounter (INDEPENDENT_AMBULATORY_CARE_PROVIDER_SITE_OTHER): Payer: Self-pay | Admitting: Vascular Surgery

## 2024-10-20 ENCOUNTER — Ambulatory Visit (INDEPENDENT_AMBULATORY_CARE_PROVIDER_SITE_OTHER): Payer: 59

## 2024-10-20 VITALS — BP 156/78 | HR 70 | Resp 18 | Wt 196.0 lb

## 2024-10-20 DIAGNOSIS — I739 Peripheral vascular disease, unspecified: Secondary | ICD-10-CM

## 2024-10-20 DIAGNOSIS — E782 Mixed hyperlipidemia: Secondary | ICD-10-CM | POA: Diagnosis not present

## 2024-10-20 DIAGNOSIS — Z89611 Acquired absence of right leg above knee: Secondary | ICD-10-CM | POA: Diagnosis not present

## 2024-10-20 DIAGNOSIS — I10 Essential (primary) hypertension: Secondary | ICD-10-CM | POA: Diagnosis not present

## 2024-10-20 DIAGNOSIS — E1159 Type 2 diabetes mellitus with other circulatory complications: Secondary | ICD-10-CM | POA: Diagnosis not present

## 2024-10-22 LAB — VAS US ABI WITH/WO TBI: Left ABI: 1.17

## 2024-10-26 ENCOUNTER — Encounter (INDEPENDENT_AMBULATORY_CARE_PROVIDER_SITE_OTHER): Payer: Self-pay | Admitting: Vascular Surgery

## 2024-10-26 NOTE — Progress Notes (Signed)
 MRN : [id number]  [name] is a 58 y.o. ([birth year]) male who presents with chief complaint of check circulation.  History of Present Illness:  The patient returns to the office for followup and review of the noninvasive studies.    There have been no interval changes in lower extremity symptoms. No interval shortening of the patient's claudication distance or development of rest pain symptoms. No new ulcers or wounds have occurred since the last visit.   The patient is noting that his ambulation and many of his activities of daily living are being limited by his current prosthesis.  It is not fitting well secondary to volume loss and morphologic changes of his stump.  He is interested in obtaining an updated prosthetic that would improve his activities of daily living.   There have been no significant changes to the patient's overall health care.   The patient denies amaurosis fugax or recent TIA symptoms. There are no documented recent neurological changes noted. There is no history of DVT, PE or superficial thrombophlebitis. The patient denies recent episodes of angina or shortness of breath.    ABI Rt=AKA and Lt=1.17  (Previous ABI Rt=AKA and Lt=1.09)   Current Meds  Medication Sig   ACCU-CHEK GUIDE TEST test strip 1 EACH BY OTHER ROUTE IN THE MORNING AND AT BEDTIME. USE AS INSTRUCTED   Accu-Chek Softclix Lancets lancets USE TO TEST BLOOD SUGAR UP TO FOUR TIMES DAILY AS DIRECTED   amLODipine  (NORVASC ) 10 MG tablet Take 10 mg by mouth daily.   Cholecalciferol (VITAMIN D3) 50 MCG (2000 UT) CAPS Take 1 capsule (2,000 Units total) by mouth daily.   cyanocobalamin  (VITAMIN B12) 1000 MCG tablet Take 1 tablet (1,000 mcg total) by mouth daily.   empagliflozin  (JARDIANCE ) 25 MG TABS tablet Take 1 tablet (25 mg total) by mouth daily.   Finerenone (KERENDIA) 10 MG TABS Take 1 tablet by mouth daily at 12 noon.    glipiZIDE  (GLUCOTROL  XL) 2.5 MG 24 hr tablet Take 1 tablet (2.5 mg total) by mouth daily with breakfast. Prn only   hydrALAZINE  (APRESOLINE ) 10 MG tablet TAKE 1 TABLET (10 MG TOTAL) BY MOUTH 3 (THREE) TIMES DAILY. IF BP ABOVE 140/90   Plecanatide  (TRULANCE ) 3 MG TABS Take 1 tablet (3 mg total) by mouth daily at 12 noon.   rosuvastatin  (CRESTOR ) 40 MG tablet Take 1 tablet (40 mg total) by mouth daily.   Semaglutide  (RYBELSUS ) 14 MG TABS Take 1 tablet (14 mg total) by mouth daily.   valsartan -hydrochlorothiazide  (DIOVAN -HCT) 320-25 MG tablet Take 1 tablet by mouth daily.    Past Medical History:  Diagnosis Date   Diabetes mellitus without complication (HCC)    Hyperlipidemia    Hypertension     Past Surgical History:  Procedure Laterality Date   AMPUTATION Right 04/16/2021   Procedure: AMPUTATION  RIGHT 5TH RAY, INCISION AND DRAINAGE;  Surgeon: Janit Thresa HERO, DPM;  Location: [facility];  Service: Podiatry;  Laterality: Right;   AMPUTATION Right 11/24/2022   Procedure: AMPUTATION ABOVE KNEE;  Surgeon: Jama Cordella MATSU, MD;  Location: [facility];  Service: Vascular;  Laterality: Right;   APPLICATION OF WOUND VAC Right 04/21/2021   Procedure: APPLICATION OF WOUND VAC;  Surgeon: Janit Thresa HERO, DPM;  Location: [facility];  Service: Podiatry;  Laterality: Right;   BONE BIOPSY Left 06/27/2020   Procedure: BONE BIOPSY;  Surgeon: Lennie Barter, DPM;  Location: [facility];  Service: Podiatry;  Laterality: Left;   COLONOSCOPY WITH PROPOFOL  N/A 06/22/2022   Procedure: COLONOSCOPY WITH PROPOFOL ;  Surgeon: Therisa Bi, MD;  Location: [facility]OSCOPY;  Service: Gastroenterology;  Laterality: N/A;   I & D EXTREMITY Right 11/22/2022   Procedure: IRRIGATION AND DEBRIDEMENT EXTREMITY, below knee amputation right leg;  Surgeon: Jordis Laneta FALCON, MD;  Location: [facility];  Service: General;  Laterality: Right;   INCISION AND DRAINAGE Left 06/27/2020   Procedure: INCISION AND DRAINAGE;  Surgeon: Lennie Barter, DPM;   Location: [facility];  Service: Podiatry;  Laterality: Left;   IRRIGATION AND DEBRIDEMENT FOOT Right 04/21/2021   Procedure: IRRIGATION AND DEBRIDEMENT FOOT;  Surgeon: Janit Thresa HERO, DPM;  Location: [facility];  Service: Podiatry;  Laterality: Right;   LOWER EXTREMITY ANGIOGRAPHY Left 06/30/2020   Procedure: Lower Extremity Angiography;  Surgeon: Marea Selinda RAMAN, MD;  Location: [facility] IN[facility];  Service: Cardiovascular;  Laterality: Left;   LOWER EXTREMITY ANGIOGRAPHY Right 04/14/2021   Procedure: Lower Extremity Angiography;  Surgeon: Marea Selinda RAMAN, MD;  Location: [facility];  Service: Cardiovascular;  Laterality: Right;   NO PAST SURGERIES     WOUND DEBRIDEMENT Right 04/19/2021   Procedure: DEBRIDEMENT WOUND;  Surgeon: Janit Thresa HERO, DPM;  Location: [facility];  Service: Podiatry;  Laterality: Right;    Social History Social History   Tobacco Use   Smoking status: Never   Smokeless tobacco: Never  Vaping Use   Vaping status: Never Used  Substance Use Topics   Alcohol use: Yes    Alcohol/week: 6.0 - 12.0 standard drinks of alcohol    Types: 6 - 12 Cans of beer per week    Comment: Occasional   Drug use: No    Family History Family History  Problem Relation Age of Onset   Heart disease Father    Heart attack Father    Healthy Mother    Prostate cancer Neg Hx    Kidney cancer Neg Hx    Bladder Cancer Neg Hx     No Known Allergies   REVIEW OF SYSTEMS (Negative unless checked)  Constitutional: [] Weight loss  [] Fever  [] Chills Cardiac: [] Chest pain   [] Chest pressure   [] Palpitations   [] Shortness of breath when laying flat   [] Shortness of breath with exertion. Vascular:  [x] Pain in legs with walking   [] Pain in legs at rest  [] History of DVT   [] Phlebitis   [] Swelling in legs   [] Varicose veins   [] Non-healing ulcers Pulmonary:   [] Uses home oxygen   [] Productive cough   [] Hemoptysis   [] Wheeze  [] COPD   [] Asthma Neurologic:  [] Dizziness   [] Seizures   [] History of  stroke   [] History of TIA  [] Aphasia   [] Vissual changes   [] Weakness or numbness in arm   [] Weakness or numbness in leg Musculoskeletal:   [] Joint swelling   [] Joint pain   [] Low back pain Hematologic:  [] Easy bruising  [] Easy bleeding   [] Hypercoagulable state   [] Anemic Gastrointestinal:  [] Diarrhea   [] Vomiting  [] Gastroesophageal reflux/heartburn   [] Difficulty swallowing. Genitourinary:  [] Chronic kidney disease   [] Difficult urination  [] Frequent urination   [] Blood  in urine Skin:  [] Rashes   [] Ulcers  Psychological:  [] History of anxiety   []  History of major depression.  Physical Examination  Vitals:   10/20/24 1334  BP: (!) 156/78  Pulse: 70  Resp: 18  Weight: 196 lb (88.9 kg)   Body mass index is 27.34 kg/m. Gen: WD/WN, NAD Head: Murphys/AT, No temporalis wasting.  Ear/Nose/Throat: Hearing grossly intact, nares w/o erythema or drainage Eyes: PER, EOMI, sclera nonicteric.  Neck: Supple, no masses.  No bruit or JVD.  Pulmonary:  Good air movement, no audible wheezing, no use of accessory muscles.  Cardiac: RRR, normal S1, S2, no Murmurs. Vascular:  mild trophic changes, no open wounds Vessel Right Left  Radial Palpable Palpable  PT AKA Not Palpable  DP AKA Not Palpable  Gastrointestinal: soft, non-distended. No guarding/no peritoneal signs.  Musculoskeletal: M/S 5/5 throughout.  No visible deformity.  Neurologic: CN 2-12 intact. Pain and light touch intact in extremities.  Symmetrical.  Speech is fluent. Motor exam as listed above. Psychiatric: Judgment intact, Mood & affect appropriate for pt's clinical situation. Dermatologic: No rashes or ulcers noted.  No changes consistent with cellulitis.   CBC Lab Results  Component Value Date   WBC 6.1 06/04/2023   HGB 16.0 06/04/2023   HCT 47.4 06/04/2023   MCV 90.3 06/04/2023   PLT 270 06/04/2023    BMET    Component Value Date/Time   NA 139 08/12/2024 1524   NA 141 09/06/2022 1417   K 4.8 08/12/2024 1524   CL 103  08/12/2024 1524   CO2 25 08/12/2024 1524   GLUCOSE 112 (H) 08/12/2024 1524   BUN 24 08/12/2024 1524   BUN 25 (H) 09/06/2022 1417   CREATININE 1.06 08/12/2024 1524   CALCIUM  10.3 08/12/2024 1524   GFRNONAA >60 11/29/2022 0609   GFRNONAA 83 02/09/2021 1135   GFRAA 96 02/09/2021 1135   CrCl cannot be calculated (Patient's most recent lab result is older than the maximum 21 days allowed.).  COAG No results found for: INR, PROTIME  Radiology VAS US  ABI WITH/WO TBI Result Date: 10/22/2024  LOWER EXTREMITY DOPPLER STUDY Patient Name:  [name]  Date of Exam:   10/20/2024 Medical Rec #: [id number]      Accession #:    [id number] Date of Birth: 1965/12/26      Patient Gender: M Patient Age:   75 years Exam Location:  [facility] Procedure:      VAS US  ABI WITH/WO TBI Referring Phys: [facility] --------------------------------------------------------------------------------  Indications: Peripheral artery disease. High Risk Factors: Hypertension, hyperlipidemia, Diabetes, no history of                    smoking.  Vascular Interventions: Right AKA. Performing Technologist: Donnice Charnley RVT  Examination Guidelines: A complete evaluation includes at minimum, Doppler waveform signals and systolic blood pressure reading at the level of bilateral brachial, anterior tibial, and posterior tibial arteries, when vessel segments are accessible. Bilateral testing is considered an integral part of a complete examination. Photoelectric Plethysmograph (PPG) waveforms and toe systolic pressure readings are included as required and additional duplex testing as needed. Limited examinations for reoccurring indications may be performed as noted.  ABI Findings: +--------+------------------+-----+--------+--------+ Right   Rt Pressure (mmHg)IndexWaveformComment  +--------+------------------+-----+--------+--------+ [id number]                                      +--------+------------------+-----+--------+--------+ +---------+------------------+-----+---------+-------+  Left     Lt Pressure (mmHg)IndexWaveform Comment +---------+------------------+-----+---------+-------+ Brachial 211                                     +---------+------------------+-----+---------+-------+ PTA      250               1.17 triphasic        +---------+------------------+-----+---------+-------+ DP       226               1.06 triphasic        +---------+------------------+-----+---------+-------+ Great Toe179               0.84                  +---------+------------------+-----+---------+-------+ +-------+-----------+-----------+------------+------------+ ABI/TBIToday's ABIToday's TBIPrevious ABIPrevious TBI +-------+-----------+-----------+------------+------------+ Right  AKA                   AKA                      +-------+-----------+-----------+------------+------------+ Left   1.17       0.84       1.09        0.99         +-------+-----------+-----------+------------+------------+ Left ABIs appear essentially unchanged compared to prior study on 10/22/2023.  Summary: Left: Resting left ankle-brachial index is within normal range. The left toe-brachial index is normal.  *See table(s) above for measurements and observations.  Electronically signed by Cordella Shawl MD on 10/22/2024 at 11:13:57 AM.    Final      Assessment/Plan 1. PAD (peripheral artery disease) (Primary) Recommend:   The patient has evidence of atherosclerosis of the lower extremities with claudication.  The patient does not voice lifestyle limiting changes at this point in time.   Noninvasive studies do not suggest clinically significant change.   No invasive studies, angiography or surgery at this time The patient should continue walking and begin a more formal exercise program.  The patient should continue antiplatelet therapy and aggressive treatment  of the lipid abnormalities   No changes in the patient's medications at this time   Continued surveillance is indicated as atherosclerosis is likely to progress with time.     The patient will continue follow up with noninvasive studies as ordered.  - VAS US  ABI WITH/WO TBI; Future  2. History of above knee amputation, right (HCC) [name] is a right transfemoral amputee. He does not have any comorbidities that impact his mobility or his ability to function with a prosthetic limb. Currently brandies prosthesis is poorly fitting due to volume loss in his knee/prosthesis is not meeting his functional needs. Osa verbally communicates a strong desire to get a new prosthesis with a microprocessor knee. At the present time he is using a combination of a walker or a cane but would not be expected to require these assistive devices with a new prosthesis and continued physical therapy. He is a very motivated individual and is very anxious to get an improved prosthesis that would grant him a marked increase in his independence. He has progressed to a K3 level of ambulation walking inside and outside his home over all environmental barriers at a variable cadence. There is no doubt that he would benefit from a socket replacement with a microprocessor controlled knee. He is an excellent candidate.   3. Primary hypertension Continue antihypertensive  medications as already ordered, these medications have been reviewed and there are no changes at this time.  4. Type 2 diabetes mellitus with other circulatory complication, unspecified whether long term insulin  use (HCC) Continue hypoglycemic medications as already ordered, these medications have been reviewed and there are no changes at this time.  Hgb A1C to be monitored as already arranged by primary service  5. Mixed hyperlipidemia Continue statin as ordered and reviewed, no changes at this time    Cordella Shawl, MD  10/26/2024 3:41 PM

## 2024-11-20 ENCOUNTER — Other Ambulatory Visit: Payer: Self-pay | Admitting: Family Medicine

## 2024-11-20 DIAGNOSIS — E1169 Type 2 diabetes mellitus with other specified complication: Secondary | ICD-10-CM

## 2024-11-21 ENCOUNTER — Telehealth: Payer: Self-pay

## 2024-11-21 ENCOUNTER — Other Ambulatory Visit (HOSPITAL_COMMUNITY): Payer: Self-pay

## 2024-11-21 NOTE — Telephone Encounter (Signed)
 Pharmacy Patient Advocate Encounter   Received notification from Onbase that prior authorization for Rybelsus  14 tabs is required/requested.   Insurance verification completed.   The patient is insured through [facility].   Per test claim: The current 90 day co-pay is, $0.00.  No PA needed at this time. This test claim was processed through [facility]- copay amounts may vary at other pharmacies due to pharmacy/plan contracts, or as the patient moves through the different stages of their insurance plan.       Patient current PA expires 12/10/24. Will submit for renewal at that time.

## 2024-12-16 ENCOUNTER — Ambulatory Visit: Admitting: Family Medicine

## 2024-12-18 ENCOUNTER — Other Ambulatory Visit: Payer: Self-pay | Admitting: Family Medicine

## 2024-12-22 ENCOUNTER — Encounter

## 2025-01-14 ENCOUNTER — Encounter

## 2025-01-14 ENCOUNTER — Other Ambulatory Visit (HOSPITAL_COMMUNITY): Payer: Self-pay

## 2025-01-27 ENCOUNTER — Ambulatory Visit: Admitting: Internal Medicine

## 2025-10-19 ENCOUNTER — Encounter (INDEPENDENT_AMBULATORY_CARE_PROVIDER_SITE_OTHER)

## 2025-10-19 ENCOUNTER — Ambulatory Visit (INDEPENDENT_AMBULATORY_CARE_PROVIDER_SITE_OTHER): Admitting: Vascular Surgery
